# Patient Record
Sex: Female | Born: 1945 | Race: White | Hispanic: No | Smoking: Never smoker
Health system: Southern US, Community
[De-identification: ages and names within clinical notes are randomized; demographics above are authoritative.]

## PROBLEM LIST (undated history)

## (undated) DIAGNOSIS — Z8719 Personal history of other diseases of the digestive system: Secondary | ICD-10-CM

## (undated) DIAGNOSIS — D649 Anemia, unspecified: Secondary | ICD-10-CM

## (undated) DIAGNOSIS — E7800 Pure hypercholesterolemia, unspecified: Secondary | ICD-10-CM

## (undated) DIAGNOSIS — E1142 Type 2 diabetes mellitus with diabetic polyneuropathy: Secondary | ICD-10-CM

## (undated) DIAGNOSIS — F419 Anxiety disorder, unspecified: Secondary | ICD-10-CM

## (undated) DIAGNOSIS — I10 Essential (primary) hypertension: Secondary | ICD-10-CM

## (undated) DIAGNOSIS — K219 Gastro-esophageal reflux disease without esophagitis: Secondary | ICD-10-CM

## (undated) DIAGNOSIS — F32A Depression, unspecified: Secondary | ICD-10-CM

## (undated) DIAGNOSIS — E119 Type 2 diabetes mellitus without complications: Secondary | ICD-10-CM

## (undated) DIAGNOSIS — M1990 Unspecified osteoarthritis, unspecified site: Secondary | ICD-10-CM

## (undated) DIAGNOSIS — K5900 Constipation, unspecified: Secondary | ICD-10-CM

## (undated) DIAGNOSIS — G8929 Other chronic pain: Secondary | ICD-10-CM

## (undated) DIAGNOSIS — F329 Major depressive disorder, single episode, unspecified: Secondary | ICD-10-CM

## (undated) HISTORY — DX: Gastro-esophageal reflux disease without esophagitis: K21.9

## (undated) HISTORY — DX: Anxiety disorder, unspecified: F41.9

## (undated) HISTORY — PX: TOE SURGERY: SHX1073

## (undated) HISTORY — PX: VAGINAL HYSTERECTOMY: SUR661

## (undated) HISTORY — PX: COLONOSCOPY: SHX174

## (undated) HISTORY — PX: POLYPECTOMY: SHX149

## (undated) HISTORY — DX: Essential (primary) hypertension: I10

## (undated) HISTORY — PX: BACK SURGERY: SHX140

---
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
## 1990-04-08 HISTORY — PX: LUMBAR DISC SURGERY: SHX700

## 1994-04-08 HISTORY — PX: POSTERIOR LUMBAR FUSION: SHX6036

## 1997-12-20 ENCOUNTER — Other Ambulatory Visit: Admission: RE | Admit: 1997-12-20 | Discharge: 1997-12-20 | Payer: Self-pay | Admitting: Obstetrics and Gynecology

## 2002-01-11 ENCOUNTER — Ambulatory Visit (HOSPITAL_COMMUNITY): Admission: RE | Admit: 2002-01-11 | Discharge: 2002-01-11 | Payer: Self-pay | Admitting: Orthopedic Surgery

## 2002-01-11 ENCOUNTER — Encounter: Payer: Self-pay | Admitting: Orthopedic Surgery

## 2002-10-09 ENCOUNTER — Encounter: Payer: Self-pay | Admitting: Neurology

## 2002-10-09 ENCOUNTER — Ambulatory Visit (HOSPITAL_COMMUNITY): Admission: RE | Admit: 2002-10-09 | Discharge: 2002-10-09 | Payer: Self-pay | Admitting: Neurology

## 2003-12-22 ENCOUNTER — Other Ambulatory Visit: Admission: RE | Admit: 2003-12-22 | Discharge: 2003-12-22 | Payer: Self-pay | Admitting: Family Medicine

## 2004-02-23 ENCOUNTER — Ambulatory Visit: Payer: Self-pay | Admitting: Family Medicine

## 2004-11-08 ENCOUNTER — Ambulatory Visit: Payer: Self-pay | Admitting: Family Medicine

## 2004-11-17 ENCOUNTER — Encounter: Admission: RE | Admit: 2004-11-17 | Discharge: 2004-11-17 | Payer: Self-pay | Admitting: Family Medicine

## 2004-12-28 ENCOUNTER — Ambulatory Visit: Payer: Self-pay | Admitting: Family Medicine

## 2005-01-17 ENCOUNTER — Ambulatory Visit: Payer: Self-pay | Admitting: Family Medicine

## 2005-02-01 ENCOUNTER — Ambulatory Visit: Payer: Self-pay | Admitting: Family Medicine

## 2006-08-29 ENCOUNTER — Encounter: Payer: Self-pay | Admitting: Family Medicine

## 2006-09-03 DIAGNOSIS — I10 Essential (primary) hypertension: Secondary | ICD-10-CM | POA: Insufficient documentation

## 2006-09-03 DIAGNOSIS — G589 Mononeuropathy, unspecified: Secondary | ICD-10-CM | POA: Insufficient documentation

## 2006-09-03 DIAGNOSIS — G4700 Insomnia, unspecified: Secondary | ICD-10-CM | POA: Insufficient documentation

## 2006-09-03 DIAGNOSIS — N951 Menopausal and female climacteric states: Secondary | ICD-10-CM | POA: Insufficient documentation

## 2006-09-12 ENCOUNTER — Encounter: Payer: Self-pay | Admitting: Family Medicine

## 2006-09-15 ENCOUNTER — Encounter (INDEPENDENT_AMBULATORY_CARE_PROVIDER_SITE_OTHER): Payer: Self-pay | Admitting: *Deleted

## 2007-02-12 ENCOUNTER — Ambulatory Visit: Payer: Self-pay | Admitting: Family Medicine

## 2007-02-12 DIAGNOSIS — M545 Low back pain, unspecified: Secondary | ICD-10-CM | POA: Insufficient documentation

## 2007-02-12 DIAGNOSIS — K219 Gastro-esophageal reflux disease without esophagitis: Secondary | ICD-10-CM | POA: Insufficient documentation

## 2007-04-14 ENCOUNTER — Telehealth (INDEPENDENT_AMBULATORY_CARE_PROVIDER_SITE_OTHER): Payer: Self-pay | Admitting: *Deleted

## 2007-05-12 ENCOUNTER — Encounter: Payer: Self-pay | Admitting: Family Medicine

## 2007-05-22 ENCOUNTER — Telehealth (INDEPENDENT_AMBULATORY_CARE_PROVIDER_SITE_OTHER): Payer: Self-pay | Admitting: *Deleted

## 2007-05-25 ENCOUNTER — Telehealth (INDEPENDENT_AMBULATORY_CARE_PROVIDER_SITE_OTHER): Payer: Self-pay | Admitting: *Deleted

## 2007-07-08 ENCOUNTER — Telehealth (INDEPENDENT_AMBULATORY_CARE_PROVIDER_SITE_OTHER): Payer: Self-pay | Admitting: *Deleted

## 2007-07-09 ENCOUNTER — Ambulatory Visit: Payer: Self-pay | Admitting: Internal Medicine

## 2007-07-09 DIAGNOSIS — J069 Acute upper respiratory infection, unspecified: Secondary | ICD-10-CM | POA: Insufficient documentation

## 2007-11-12 ENCOUNTER — Telehealth (INDEPENDENT_AMBULATORY_CARE_PROVIDER_SITE_OTHER): Payer: Self-pay | Admitting: *Deleted

## 2008-08-11 ENCOUNTER — Other Ambulatory Visit: Admission: RE | Admit: 2008-08-11 | Discharge: 2008-08-11 | Payer: Self-pay | Admitting: Family Medicine

## 2008-08-11 ENCOUNTER — Encounter: Payer: Self-pay | Admitting: Family Medicine

## 2008-08-11 ENCOUNTER — Ambulatory Visit: Payer: Self-pay | Admitting: Family Medicine

## 2008-08-11 DIAGNOSIS — M810 Age-related osteoporosis without current pathological fracture: Secondary | ICD-10-CM | POA: Insufficient documentation

## 2008-08-12 ENCOUNTER — Encounter: Payer: Self-pay | Admitting: Family Medicine

## 2008-08-15 ENCOUNTER — Encounter (INDEPENDENT_AMBULATORY_CARE_PROVIDER_SITE_OTHER): Payer: Self-pay | Admitting: *Deleted

## 2008-09-07 ENCOUNTER — Ambulatory Visit: Payer: Self-pay | Admitting: Family Medicine

## 2008-09-18 LAB — CONVERTED CEMR LAB
ALT: 23 units/L (ref 0–35)
AST: 23 units/L (ref 0–37)
Albumin: 3.7 g/dL (ref 3.5–5.2)
Alkaline Phosphatase: 48 units/L (ref 39–117)
BUN: 10 mg/dL (ref 6–23)
Basophils Absolute: 0 10*3/uL (ref 0.0–0.1)
Basophils Relative: 0.6 % (ref 0.0–3.0)
Bilirubin, Direct: 0 mg/dL (ref 0.0–0.3)
CO2: 33 meq/L — ABNORMAL HIGH (ref 19–32)
Calcium: 9 mg/dL (ref 8.4–10.5)
Chloride: 103 meq/L (ref 96–112)
Cholesterol: 193 mg/dL (ref 0–200)
Creatinine, Ser: 0.6 mg/dL (ref 0.4–1.2)
Eosinophils Absolute: 0.1 10*3/uL (ref 0.0–0.7)
Eosinophils Relative: 1.8 % (ref 0.0–5.0)
GFR calc non Af Amer: 108.22 mL/min (ref >60)
Glucose, Bld: 132 mg/dL — ABNORMAL HIGH (ref 70–99)
HCT: 39.5 % (ref 36.0–46.0)
HDL: 71.8 mg/dL (ref >39.00)
Hemoglobin: 13.6 g/dL (ref 12.0–15.0)
LDL Cholesterol: 105 mg/dL — ABNORMAL HIGH (ref 0–99)
Lymphocytes Relative: 23.1 % (ref 12.0–46.0)
Lymphs Abs: 1.8 10*3/uL (ref 0.7–4.0)
MCHC: 34.4 g/dL (ref 30.0–36.0)
MCV: 83.4 fL (ref 78.0–100.0)
Monocytes Absolute: 0.6 10*3/uL (ref 0.1–1.0)
Monocytes Relative: 8.2 % (ref 3.0–12.0)
Neutro Abs: 5.3 10*3/uL (ref 1.4–7.7)
Neutrophils Relative %: 66.3 % (ref 43.0–77.0)
Platelets: 218 10*3/uL (ref 150.0–400.0)
Potassium: 3.6 meq/L (ref 3.5–5.1)
RBC: 4.73 M/uL (ref 3.87–5.11)
RDW: 12 % (ref 11.5–14.6)
Sodium: 142 meq/L (ref 135–145)
TSH: 0.75 microintl units/mL (ref 0.35–5.50)
Total Bilirubin: 0.5 mg/dL (ref 0.3–1.2)
Total CHOL/HDL Ratio: 3
Total Protein: 7 g/dL (ref 6.0–8.3)
Triglycerides: 83 mg/dL (ref 0.0–149.0)
VLDL: 16.6 mg/dL (ref 0.0–40.0)
WBC: 7.8 10*3/uL (ref 4.5–10.5)

## 2008-09-19 ENCOUNTER — Encounter (INDEPENDENT_AMBULATORY_CARE_PROVIDER_SITE_OTHER): Payer: Self-pay | Admitting: *Deleted

## 2008-12-23 ENCOUNTER — Telehealth (INDEPENDENT_AMBULATORY_CARE_PROVIDER_SITE_OTHER): Payer: Self-pay | Admitting: *Deleted

## 2009-01-27 ENCOUNTER — Ambulatory Visit: Payer: Self-pay | Admitting: Family Medicine

## 2009-02-07 ENCOUNTER — Encounter (INDEPENDENT_AMBULATORY_CARE_PROVIDER_SITE_OTHER): Payer: Self-pay | Admitting: *Deleted

## 2009-02-07 ENCOUNTER — Telehealth (INDEPENDENT_AMBULATORY_CARE_PROVIDER_SITE_OTHER): Payer: Self-pay | Admitting: *Deleted

## 2009-02-07 LAB — CONVERTED CEMR LAB
ALT: 21 units/L (ref 0–35)
AST: 22 units/L (ref 0–37)
Albumin: 3.6 g/dL (ref 3.5–5.2)
Alkaline Phosphatase: 47 units/L (ref 39–117)
BUN: 11 mg/dL (ref 6–23)
Bilirubin, Direct: 0 mg/dL (ref 0.0–0.3)
CO2: 32 meq/L (ref 19–32)
Calcium: 8.8 mg/dL (ref 8.4–10.5)
Chloride: 101 meq/L (ref 96–112)
Cholesterol: 144 mg/dL (ref 0–200)
Creatinine, Ser: 0.7 mg/dL (ref 0.4–1.2)
GFR calc non Af Amer: 90.46 mL/min (ref >60)
Glucose, Bld: 125 mg/dL — ABNORMAL HIGH (ref 70–99)
HDL: 61.4 mg/dL (ref >39.00)
Hgb A1c MFr Bld: 6.9 % — ABNORMAL HIGH (ref 4.6–6.5)
LDL Cholesterol: 68 mg/dL (ref 0–99)
Potassium: 3.6 meq/L (ref 3.5–5.1)
Sodium: 141 meq/L (ref 135–145)
Total Bilirubin: 0.5 mg/dL (ref 0.3–1.2)
Total CHOL/HDL Ratio: 2
Total Protein: 6.7 g/dL (ref 6.0–8.3)
Triglycerides: 72 mg/dL (ref 0.0–149.0)
VLDL: 14.4 mg/dL (ref 0.0–40.0)

## 2009-02-09 ENCOUNTER — Ambulatory Visit: Payer: Self-pay | Admitting: Family Medicine

## 2009-02-13 ENCOUNTER — Encounter (INDEPENDENT_AMBULATORY_CARE_PROVIDER_SITE_OTHER): Payer: Self-pay | Admitting: *Deleted

## 2009-02-13 LAB — CONVERTED CEMR LAB
Free T4: 0.9 ng/dL (ref 0.6–1.6)
T3, Free: 3.5 pg/mL (ref 2.3–4.2)
TSH: 1.53 microintl units/mL (ref 0.35–5.50)

## 2009-04-24 ENCOUNTER — Telehealth (INDEPENDENT_AMBULATORY_CARE_PROVIDER_SITE_OTHER): Payer: Self-pay | Admitting: *Deleted

## 2009-07-20 ENCOUNTER — Ambulatory Visit: Payer: Self-pay | Admitting: Family Medicine

## 2009-07-21 ENCOUNTER — Encounter: Payer: Self-pay | Admitting: Family Medicine

## 2009-07-25 LAB — CONVERTED CEMR LAB
ALT: 23 units/L (ref 0–35)
AST: 25 units/L (ref 0–37)
Albumin: 3.6 g/dL (ref 3.5–5.2)
Alkaline Phosphatase: 50 units/L (ref 39–117)
BUN: 6 mg/dL (ref 6–23)
Bilirubin, Direct: 0 mg/dL (ref 0.0–0.3)
CO2: 31 meq/L (ref 19–32)
Calcium: 9 mg/dL (ref 8.4–10.5)
Chloride: 102 meq/L (ref 96–112)
Cholesterol: 133 mg/dL (ref 0–200)
Creatinine, Ser: 0.7 mg/dL (ref 0.4–1.2)
GFR calc non Af Amer: 90.32 mL/min (ref >60)
Glucose, Bld: 119 mg/dL — ABNORMAL HIGH (ref 70–99)
HDL: 72.4 mg/dL (ref >39.00)
Hgb A1c MFr Bld: 7 % — ABNORMAL HIGH (ref 4.6–6.5)
LDL Cholesterol: 48 mg/dL (ref 0–99)
Potassium: 3.4 meq/L — ABNORMAL LOW (ref 3.5–5.1)
Sodium: 142 meq/L (ref 135–145)
Total Bilirubin: 0.3 mg/dL (ref 0.3–1.2)
Total CHOL/HDL Ratio: 2
Total Protein: 7 g/dL (ref 6.0–8.3)
Triglycerides: 63 mg/dL (ref 0.0–149.0)
VLDL: 12.6 mg/dL (ref 0.0–40.0)

## 2009-08-10 ENCOUNTER — Ambulatory Visit: Payer: Self-pay | Admitting: Family Medicine

## 2009-08-10 DIAGNOSIS — E1142 Type 2 diabetes mellitus with diabetic polyneuropathy: Secondary | ICD-10-CM | POA: Insufficient documentation

## 2009-08-16 ENCOUNTER — Telehealth (INDEPENDENT_AMBULATORY_CARE_PROVIDER_SITE_OTHER): Payer: Self-pay | Admitting: *Deleted

## 2009-08-25 ENCOUNTER — Telehealth: Payer: Self-pay | Admitting: Family Medicine

## 2009-11-21 ENCOUNTER — Encounter: Payer: Self-pay | Admitting: Family Medicine

## 2009-11-24 ENCOUNTER — Ambulatory Visit: Payer: Self-pay | Admitting: Family Medicine

## 2009-11-27 LAB — CONVERTED CEMR LAB
ALT: 16 units/L (ref 0–35)
AST: 15 units/L (ref 0–37)
Albumin: 3.7 g/dL (ref 3.5–5.2)
Alkaline Phosphatase: 50 units/L (ref 39–117)
BUN: 14 mg/dL (ref 6–23)
Bilirubin, Direct: 0.1 mg/dL (ref 0.0–0.3)
CO2: 35 meq/L — ABNORMAL HIGH (ref 19–32)
Calcium: 9.4 mg/dL (ref 8.4–10.5)
Chloride: 99 meq/L (ref 96–112)
Cholesterol: 152 mg/dL (ref 0–200)
Creatinine, Ser: 0.7 mg/dL (ref 0.4–1.2)
Creatinine,U: 147.8 mg/dL
GFR calc non Af Amer: 85.95 mL/min (ref >60)
Glucose, Bld: 98 mg/dL (ref 70–99)
HDL: 66 mg/dL (ref >39.00)
Hgb A1c MFr Bld: 6.9 % — ABNORMAL HIGH (ref 4.6–6.5)
LDL Cholesterol: 66 mg/dL (ref 0–99)
Microalb Creat Ratio: 1.9 mg/g (ref 0.0–30.0)
Microalb, Ur: 2.8 mg/dL — ABNORMAL HIGH (ref 0.0–1.9)
Potassium: 3.8 meq/L (ref 3.5–5.1)
Sodium: 141 meq/L (ref 135–145)
Total Bilirubin: 0.4 mg/dL (ref 0.3–1.2)
Total CHOL/HDL Ratio: 2
Total Protein: 6.6 g/dL (ref 6.0–8.3)
Triglycerides: 100 mg/dL (ref 0.0–149.0)
VLDL: 20 mg/dL (ref 0.0–40.0)

## 2010-01-04 ENCOUNTER — Encounter: Payer: Self-pay | Admitting: Family Medicine

## 2010-01-25 ENCOUNTER — Encounter: Payer: Self-pay | Admitting: Family Medicine

## 2010-02-14 ENCOUNTER — Telehealth: Payer: Self-pay | Admitting: Family Medicine

## 2010-02-15 ENCOUNTER — Encounter: Payer: Self-pay | Admitting: Gastroenterology

## 2010-02-15 ENCOUNTER — Ambulatory Visit: Payer: Self-pay | Admitting: Family Medicine

## 2010-02-15 DIAGNOSIS — M25571 Pain in right ankle and joints of right foot: Secondary | ICD-10-CM | POA: Insufficient documentation

## 2010-02-28 ENCOUNTER — Ambulatory Visit: Payer: Self-pay | Admitting: Family Medicine

## 2010-02-28 DIAGNOSIS — J018 Other acute sinusitis: Secondary | ICD-10-CM | POA: Insufficient documentation

## 2010-02-28 DIAGNOSIS — R110 Nausea: Secondary | ICD-10-CM | POA: Insufficient documentation

## 2010-03-26 ENCOUNTER — Telehealth: Payer: Self-pay | Admitting: Family Medicine

## 2010-03-28 ENCOUNTER — Ambulatory Visit: Payer: Self-pay | Admitting: Gastroenterology

## 2010-04-16 ENCOUNTER — Telehealth (INDEPENDENT_AMBULATORY_CARE_PROVIDER_SITE_OTHER): Payer: Self-pay | Admitting: *Deleted

## 2010-04-19 ENCOUNTER — Encounter: Payer: Self-pay | Admitting: Family Medicine

## 2010-04-19 ENCOUNTER — Ambulatory Visit
Admission: RE | Admit: 2010-04-19 | Discharge: 2010-04-19 | Payer: Self-pay | Source: Home / Self Care | Attending: Family Medicine | Admitting: Family Medicine

## 2010-04-19 DIAGNOSIS — R42 Dizziness and giddiness: Secondary | ICD-10-CM | POA: Insufficient documentation

## 2010-04-25 ENCOUNTER — Telehealth: Payer: Self-pay | Admitting: Family Medicine

## 2010-04-26 ENCOUNTER — Telehealth: Payer: Self-pay | Admitting: Family Medicine

## 2010-05-02 ENCOUNTER — Telehealth (INDEPENDENT_AMBULATORY_CARE_PROVIDER_SITE_OTHER): Payer: Self-pay | Admitting: *Deleted

## 2010-05-08 ENCOUNTER — Encounter (INDEPENDENT_AMBULATORY_CARE_PROVIDER_SITE_OTHER): Payer: Self-pay | Admitting: *Deleted

## 2010-05-08 ENCOUNTER — Ambulatory Visit
Admission: RE | Admit: 2010-05-08 | Discharge: 2010-05-08 | Payer: Self-pay | Source: Home / Self Care | Attending: Gastroenterology | Admitting: Gastroenterology

## 2010-05-08 DIAGNOSIS — R1013 Epigastric pain: Secondary | ICD-10-CM

## 2010-05-08 DIAGNOSIS — K3189 Other diseases of stomach and duodenum: Secondary | ICD-10-CM | POA: Insufficient documentation

## 2010-05-08 DIAGNOSIS — R197 Diarrhea, unspecified: Secondary | ICD-10-CM | POA: Insufficient documentation

## 2010-05-10 NOTE — Progress Notes (Signed)
Summary: Phone-rx  Phone Note Call from Patient Call back at Home Phone [phone]   Caller: Patient Summary of Call: patient is requesting a new rx on estradiol.  pharmacy [facility] [address]  Initial call taken by: Barb Merino,  April 24, 2009 4:06 PM    Prescriptions: ESTRADIOL 1 MG  TABS (ESTRADIOL) 1 by mouth once daily  #30 x 0   Entered by:   Army Fossa CMA   Authorized by:   Loreen Freud DO   Signed by:   Army Fossa CMA on 04/24/2009   Method used:   Electronically to        [facility] (retail)       [address]       [place], [place]  [address]       Ph: [phone]       Fax: [phone]   RxID:   [id number]

## 2010-05-10 NOTE — Miscellaneous (Signed)
  Clinical Lists Changes  Observations: Added new observation of ZOSTAVAX: Zostavax (01/02/2010 15:23)      Immunization History:  Zostavax History:    Zostavax # 1:  Zostavax (01/02/2010)

## 2010-05-10 NOTE — Progress Notes (Signed)
Summary: Refill Request  Phone Note Refill Request Call back at [phone] Message from:  Pharmacy on April 16, 2010 10:51 AM  Refills Requested: Medication #1:  GLUCOPHAGE XR 500 MG XR24H-TAB 1 by mouth at bedtime   Dosage confirmed as above?Dosage Confirmed   Supply Requested: 1 month   Last Refilled: 02/15/2010 [facility] [address]   Next Appointment Scheduled: none Initial call taken by: Harold Barban,  April 16, 2010 10:52 AM    Prescriptions: GLUCOPHAGE XR 500 MG XR24H-TAB (METFORMIN HCL) 1 by mouth at bedtime  #30 Each x 1   Entered by:   Almeta Monas CMA (AAMA)   Authorized by:   Loreen Freud DO   Signed by:   Almeta Monas CMA (AAMA) on 04/16/2010   Method used:   Faxed to ...       [facility] (ret[facility])       [address]       [place], [place]  [address]       Ph: [phone]       Fax: [phone]   RxID:   [id number]

## 2010-05-10 NOTE — Progress Notes (Signed)
Summary: Rx request  Phone Note Refill Request Call back at Home Phone [phone] Message from:  Patient  Refills Requested: Medication #1:  DEXILANT 60 MG 1 tab qd Pt states that she was given sample of med and would now like to have a Rx sent in to pharmacy.Pt uses [facility] [address] Pls advise..........Marland KitchenFelecia Deloach CMA  April 25, 2010 10:56 AM    Follow-up for Phone Call        ok to send #30  5 refills  1 by mouth once daily  Follow-up by: Loreen Freud DO,  April 25, 2010 11:28 AM    New/Updated Medications: DEXILANT 60 MG CPDR (DEXLANSOPRAZOLE) 1 by mouth once daily Prescriptions: DEXILANT 60 MG CPDR (DEXLANSOPRAZOLE) 1 by mouth once daily  #30 x 5   Entered by:   Almeta Monas CMA (AAMA)   Authorized by:   Loreen Freud DO   Signed by:   Almeta Monas CMA (AAMA) on 04/25/2010   Method used:   Faxed to ...       [facility] [id number] (retail)       [address] [place], [place]  [address]       Ph: [phone]       Fax: [phone]   RxID:   [id number]

## 2010-05-10 NOTE — Progress Notes (Signed)
Summary: rx-[facility]  Phone Note Refill Request Call back at Home Phone [phone] Message from:  Patient on [facility]  Refills Requested: Medication #1:  ZOSTAVAX 62130 UNT/0.65ML SOLR 1 ml IM x1 Pt states that she just received a call that they have her vaccine in. pt would like to have rx sent to pharmacy.  Initial call taken by: Jeremy Johann CMA,  Aug 16, 2009 4:10 PM  Follow-up for Phone Call        pt aware rx sent to pharmacy.[facility] Del[facility] CMA  Aug 16, 2009 4:16 PM     Prescriptions: ZOSTAVAX [id number] UNT/0.65ML SOLR (ZOSTER VACCINE LIVE) 1 ml IM x1  #1 x 0   Entered by:   Jeremy Johann CMA   Authorized by:   Loreen Freud DO   Signed by:   Jeremy Johann CMA on 08/16/2009   Method used:   Faxed to ...       [facility]* (retail)       [address]       [place], [place]  [id number]       Ph: [phone]       Fax: [phone]   RxID:   [id number]

## 2010-05-10 NOTE — Progress Notes (Signed)
Summary: sinus infection  Phone Note Call from Patient Call back at Home Phone [phone]   Caller: Patient Summary of Call: Pt seen on 02-28-10 Pt states that she is no better and would like to get a med Rx. Pt not c/o head congestion, drainage, and facial pain. Pt denies a cough, fever, or sore throat. Pt advise OV may be needed since it has been just about a month since last OV. Pt states that she is unable to afford OV and med so she would prefer if med can be Rx. [facility] [address] Pls advise.................[facility]land KitchenFelecia Deloach CMA  March 26, 2010 10:00 AM   Follow-up for Phone Call        I will do this time since it's a busy week and the holidays but let her know she should not wait more than 2 weeks to call us back.  Rx sent to pharmacy Follow-up by: Loreen Freud DO,  March 26, 2010 10:42 AM  Additional Follow-up for Phone Call Additional follow up Details #1::        pt aware.... Almeta Monas CMA Duncan Dull)  March 26, 2010 11:21 AM     New/Updated Medications: AUGMENTIN 875-125 MG TABS (AMOXICILLIN-POT CLAVULANATE) 1 by mouth two times a day Prescriptions: AUGMENTIN 875-125 MG TABS (AMOXICILLIN-POT CLAVULANATE) 1 by mouth two times a day  #20 x 0   Entered and Authorized by:   Loreen Freud DO   Signed by:   Loreen Freud DO on 03/26/2010   Method used:   Electronically to        [id number] (retail)       [address] [place], [place]  [address]       Ph: [phone]       Fax: [phone]   RxID:   [id number] AUGMENTIN 875-125 MG TABS (AMOXICILLIN-POT CLAVULANATE) 1 by mouth two times a day  #20 x 0   Entered and Authorized by:   Loreen Freud DO   Signed by:   Loreen Freud DO on 03/26/2010   Method used:   Electronically to        [facility] (retail)       [address]       [place], [place]  [address]       Ph: [phone]       Fax: [phone]   RxID:   [id number]

## 2010-05-10 NOTE — Medication Information (Signed)
Summary: [facility]/[employer]  [facility]   Imported By: Lanelle Bal 11/30/2009 14:18:17  _____________________________________________________________________  External Attachment:    Type:   Image     Comment:   External Document

## 2010-05-10 NOTE — Progress Notes (Signed)
Summary: med too expensive  Phone Note Refill Request Call back at Home Phone [phone]   Refills Requested: Medication #1:  DEXILANT 60 MG CPDR 1 by mouth once daily. Pt states that med is $75 and she is unable to afford it. Pt would like to try a cheaper alternative. [facility] uses [facility] Pls advise.........Marland KitchenFelecia Deloach CMA  April 26, 2010 11:44 AM    Follow-up for Phone Call        omeprazole did not work --- pt was supposed to see GI --- appointment was supposed to be last month --- what happened? Follow-up by: Loreen Freud DO,  April 26, 2010 4:50 PM  Additional Follow-up for Phone Call Additional follow up Details #1::        Pt not scheduled to see GI until 05-08-10....[facility] Kitchen[facility]lecia Deloach CMA  April 26, 2010 5:01 PM     Additional Follow-up for Phone Call Additional follow up Details #2::    nexium 40 mg  #30  1 by mouth once daily 2 refills Follow-up by: Loreen Freud DO,  April 26, 2010 5:03 PM  Additional Follow-up for Phone Call Additional follow up Details #3:: Details for Additional Follow-up Action Taken: pt aware RX sent to pharmacy Additional Follow-up by: Almeta Monas CMA Duncan Dull),  April 26, 2010 5:05 PM  New/Updated Medications: NEXIUM 40 MG CPDR (ESOMEPRAZOLE MAGNESIUM) 1 by mouth once daily Prescriptions: NEXIUM 40 MG CPDR (ESOMEPRAZOLE MAGNESIUM) 1 by mouth once daily  #30 x 2   Entered by:   Almeta Monas CMA (AAMA)   Authorized by:   Loreen Freud DO   Signed by:   Almeta Monas CMA (AAMA) on 04/26/2010   Method used:   Electronically to        [facility] [id number] (retail)       [address] [place], [place]  [address]       Ph: [phone]       Fax: [phone]   RxID:   [id number]

## 2010-05-10 NOTE — Letter (Signed)
Summary: New Patient letter  [facility]  [address] [place], [place] [address]   Phone: [phone]  Fax: [phone]       02/15/2010 MRN: [id number]  Brittany [name] [address] [place], [place]  [address]  Dear Ms. Hogston,  Welcome to the Gastroenterology Division at [facility].    You are scheduled to see Dr.  Christella Hartigan on 03-28-10 at 2:30pm on the 3rd floor at [facility], [address]  We ask that you try to arrive at our office 15 minutes prior to your appointment time to allow for check-in.  We would like you to complete the enclosed self-administered evaluation form prior to your visit and bring it with you on the day of your appointment.  We will review it with you.  Also, please bring a complete list of all your medications or, if you prefer, bring the medication bottles and we will list them.  Please bring your insurance card so that we may make a copy of it.  If your insurance requires a referral to see a specialist, please bring your referral form from your primary care physician.  Co-payments are due at the time of your visit and may be paid by cash, check or credit card.     Your office visit will consist of a consult with your physician (includes a physical exam), any laboratory testing he/she may order, scheduling of any necessary diagnostic testing (e.g. x-ray, ultrasound, CT-scan), and scheduling of a procedure (e.g. Endoscopy, Colonoscopy) if required.  Please allow enough time on your schedule to allow for any/all of these possibilities.    If you cannot keep your appointment, please call [phone] to cancel or reschedule prior to your appointment date.  This allows [place] the opportunity to schedule an appointment for another patient in need of care.  If you do not cancel or reschedule by 5 p.m. the business day prior to your appointment date, you will be charged a $50.00 late cancellation/no-show fee.    Thank you for choosing [facility] for your medical needs.  We appreciate the opportunity to care for you.  Please visit [place] at our website  to learn more about our practice.                     Sincerely,                                                             The Gastroenterology Division

## 2010-05-10 NOTE — Assessment & Plan Note (Signed)
Summary: dizziness//fd   Vital Signs:  Patient profile:   65 year old female Menstrual status:  hysterectomy Weight:      188.2 pounds Pulse rate:   80 / minute Pulse rhythm:   regular BP sitting:   122 / 76  (left arm) Cuff size:   regular  Vitals Entered By: Almeta Monas CMA Duncan Dull) (April 19, 2010 3:52 PM) CC: xweeks c/o dizziness when getting-denies sinus issues, also feels hungry after eating and concerned   History of Present Illness: Pt here c/o dizziness still ---she denies any sinus congestion,  chest pain or palpatations.   She is still having GERD symptoms ---her GI appointment is at the end of the month.  Her BS are "good" per pt.  She denies and abnormally low lows for her or highs.    Current Medications (verified): 1)  Amlodipine Besylate 5 Mg  Tabs (Amlodipine Besylate) .[name].. 1 By Mouth Once Daily 2)  Bisoprolol-Hydrochlorothiazide 10-6.25 Mg  Tabs (Bisoprolol-Hydrochlorothiazide) .[name].. 1 By Mouth Once Daily 3)  Neurontin 800 Mg  Tabs (Gabapentin) .[name].. 1 By Mouth Four Times A Day 4)  Doxepin Hcl 100 Mg  Caps (Doxepin Hcl) .[name].. 1 By Mouth At Bedtime 5)  Protonix 40 Mg Tbec (Pantoprazole Sodium) .[name].. 1 By Mouth Once Daily 6)  Estradiol 1 Mg  Tabs (Estradiol) .[name].. 1 By Mouth Once Daily 7)  Tizanidine Hcl 4 Mg  Tabs (Tizanidine Hcl) .[name].. 1 By Mouth Every 8hours 8)  Omeprazole 40 Mg Cpdr (Omeprazole) .[name].. 1 By Mouth Once Daily 9)  Tums 750mg  .... 4 By Mouth Once Daily As Needed 10)  Stool Softner .[name].. 1 By Mouth Once Daily 11)  Aspirin Ec 325 Mg  Tbec (Aspirin) .[name].. 1 By Mouth Once Daily 12)  Mvi .[name].. 1 By Mouth Once Daily 13)  Vit-D3 1000iu .[name].. 1 By Mouth Once Daily 14)  Morphine Sulfate Cr 60 Mg  Tb12 (Morphine Sulfate) .[name].. 1 By Mouth Two Times A Day 15)  Zocor 40 Mg Tabs (Simvastatin) .... Take One Tablet Each Evening At Bedtime. 16)  Fish Oil 17)  Glucophage Xr 500 Mg Xr24h-Tab (Metformin Hcl) .[name].. 1 By Mouth At Bedtime 18)  Onetouch Delica Lancets  Misc  (Lancets) .... Accu Check Two Times A Day 19)  One Touch Ultra Mini Strips .... Accu Check Two Times A Day 20)  Flonase 50 Mcg/act Susp (Fluticasone Propionate) .... 2 Sprays Each Nostril Once Daily 21)  Antivert 25 Mg Tabs (Meclizine Hcl) .[name].. 1 By Mouth Qid As Needed  Allergies (verified): 1)  ! Sulfa  Past History:  Past Medical History: Last updated: 08/10/2009 Hypertension GERD Low back pain----pain management Current Problems:  LOW BACK PAIN (ICD-724.2) PREVENTIVE HEALTH CARE (ICD-V70.0) GERD (ICD-530.81) NEUROPATHY (ICD-355.9) HOT FLASHES (ICD-627.2) INSOMNIA (ICD-780.52) HYPERTENSION (ICD-401.9) Diabetes mellitus, type II  Past Surgical History: Last updated: 02/12/2007 Lumbar fusion  Family History: Last updated: 08/10/2009 Family History CAD Family History Diabetes 1st degree relative  Social History: Last updated: 08/11/2008 Married Never Smoked Alcohol use-no Drug use-no  Risk Factors: Alcohol Use: 0 (08/11/2008) Caffeine Use: 2 (08/11/2008) Diet: well balanced (08/11/2008) Exercise: no (08/11/2008)  Risk Factors: Smoking Status: never (08/11/2008)  Family History: Reviewed history from 08/10/2009 and no changes required. Family History CAD Family History Diabetes 1st degree relative  Social History: Reviewed history from 08/11/2008 and no changes required. Married Never Smoked Alcohol use-no Drug use-no  Review of Systems      See HPI  Physical Exam  General:  Well-developed,well-nourished,in  no acute distress; alert,appropriate and cooperative throughout examination Ears:  External ear exam shows no significant lesions or deformities.  Otoscopic examination reveals clear canals, tympanic membranes are intact bilaterally without bulging, retraction, inflammation or discharge. Hearing is grossly normal bilaterally. Neck:  No deformities, masses, or tenderness noted. Lungs:  Normal respiratory effort, chest expands symmetrically. Lungs  are clear to auscultation, no crackles or wheezes. Heart:  Normal rate and regular rhythm. S1 and S2 normal without gallop, murmur, click, rub or other extra sounds. Abdomen:  Bowel sounds positive,abdomen soft and non-tender without masses, organomegaly or hernias noted. Cervical Nodes:  No lymphadenopathy noted Psych:  Oriented X3 and normally interactive.     Impression & Recommendations:  Problem # 1:  GERD (ICD-530.81)  Her updated medication list for this problem includes:    Protonix 40 Mg Tbec (Pantoprazole sodium) .[name]... 1 by mouth once daily    Omeprazole 40 Mg Cpdr (Omeprazole) .[name]... 1 by mouth once daily  Orders: EKG w/ Interpretation ([address])  Problem # 2:  DIZZINESS (ICD-780.4)  Orders: Misc. Referral (Misc. Ref) EKG w/ Interpretation ([address])  Her updated medication list for this problem includes:    Antivert 25 Mg Tabs (Meclizine hcl) .[name]... 1 by mouth qid as needed  Complete Medication List: 1)  Amlodipine Besylate 5 Mg Tabs (Amlodipine besylate) .[name].. 1 by mouth once daily 2)  Bisoprolol-hydrochlorothiazide 10-6.25 Mg Tabs (Bisoprolol-hydrochlorothiazide) .[name].. 1 by mouth once daily 3)  Neurontin 800 Mg Tabs (Gabapentin) .[name].. 1 by mouth four times a day 4)  Doxepin Hcl 100 Mg Caps (Doxepin hcl) .[name].. 1 by mouth at bedtime 5)  Protonix 40 Mg Tbec (Pantoprazole sodium) .[name].. 1 by mouth once daily 6)  Estradiol 1 Mg Tabs (Estradiol) .[name].. 1 by mouth once daily 7)  Tizanidine Hcl 4 Mg Tabs (Tizanidine hcl) .[name].. 1 by mouth every 8hours 8)  Omeprazole 40 Mg Cpdr (Omeprazole) .[name].. 1 by mouth once daily 9)  Tums 750mg   .... 4 by mouth once daily as needed 10)  Stool Softner  .[name].. 1 by mouth once daily 11)  Aspirin Ec 325 Mg Tbec (Aspirin) .[name].. 1 by mouth once daily 12)  Mvi  .[name].. 1 by mouth once daily 13)  Vit-d3 1000iu  .[name].. 1 by mouth once daily 14)  Morphine Sulfate Cr 60 Mg Tb12 (Morphine sulfate) .[name].. 1 by mouth two times a day 15)  Zocor 40 Mg Tabs (Simvastatin) ....  Take one tablet each evening at bedtime. 16)  Fish Oil  17)  Glucophage Xr 500 Mg Xr24h-tab (Metformin hcl) .[name].. 1 by mouth at bedtime 18)  Onetouch Delica Lancets Misc (Lancets) .... Accu check two times a day 19)  One Touch Ultra Mini Strips  .... Accu check two times a day 20)  Flonase 50 Mcg/act Susp (Fluticasone propionate) .... 2 sprays each nostril once daily 21)  Antivert 25 Mg Tabs (Meclizine hcl) .[name].. 1 by mouth qid as needed Prescriptions: ANTIVERT 25 MG TABS (MECLIZINE HCL) 1 by mouth qid as needed  #30 x 0   Entered and Authorized by:   Loreen Freud DO   Signed by:   Loreen Freud DO on 04/19/2010   Method used:   Electronically to        [id number] (retail)       [address] [place], [place]  [address]       Ph: [phone]       Fax: [phone]  RxID:   [id number]    Orders Added: 1)  Misc. Referral [Misc. Ref] 2)  Est. Patient Level IV [[address]] 3)  EKG w/ Interpretation [[address]]

## 2010-05-10 NOTE — Assessment & Plan Note (Signed)
Summary: FELL IN JYNWGNF ON LEFT KNEE/KN   Vital Signs:  Patient profile:   65 year old female Menstrual status:  hysterectomy Height:      68 inches Weight:      190 pounds Temp:     98.1 degrees F oral Pulse rate:   82 / minute BP sitting:   160 / 80  (left arm)  Vitals Entered By: Jeremy Johann CMA (February 15, 2010 4:41 PM) CC: fell injury left knee   History of Present Illness: 65 yo woman here today after falling in [place].  'fell full blast on my knee'.  pt doesn't have feeling in the lower leg due to neuropathy.  knee is throbbing, swollen, but can't relay degree of pain due to neuropathy.  Current Medications (verified): 1)  Amlodipine Besylate 5 Mg  Tabs (Amlodipine Besylate) .[name].. 1 By Mouth Once Daily 2)  Bisoprolol-Hydrochlorothiazide 10-6.25 Mg  Tabs (Bisoprolol-Hydrochlorothiazide) .[name].. 1 By Mouth Once Daily 3)  Neurontin 800 Mg  Tabs (Gabapentin) .[name].. 1 By Mouth Four Times A Day 4)  Doxepin Hcl 100 Mg  Caps (Doxepin Hcl) .[name].. 1 By Mouth At Bedtime 5)  Protonix 20 Mg  Tbec (Pantoprazole Sodium) .[name].. 1 By Mouth Once Daily 6)  Estradiol 1 Mg  Tabs (Estradiol) .[name].. 1 By Mouth Once Daily 7)  Tizanidine Hcl 4 Mg  Tabs (Tizanidine Hcl) .[name].. 1 By Mouth Every 8hours 8)  Omeprazole 40 Mg Cpdr (Omeprazole) .[name].. 1 By Mouth Once Daily 9)  Tums 750mg  .... 4 By Mouth Once Daily As Needed 10)  Stool Softner .[name].. 1 By Mouth Once Daily 11)  Aspirin Ec 325 Mg  Tbec (Aspirin) .[name].. 1 By Mouth Once Daily 12)  Mvi .[name].. 1 By Mouth Once Daily 13)  Vit-D3 1000iu .[name].. 1 By Mouth Once Daily 14)  Morphine Sulfate Cr 60 Mg  Tb12 (Morphine Sulfate) .[name].. 1 By Mouth Two Times A Day 15)  Zocor 40 Mg Tabs (Simvastatin) .... Take One Tablet Each Evening At Bedtime. 16)  Fish Oil 17)  Glucophage Xr 500 Mg Xr24h-Tab (Metformin Hcl) .[name].. 1 By Mouth At Bedtime 18)  Onetouch Delica Lancets  Misc (Lancets) .... Accu Check Two Times A Day 19)  One Touch Ultra Mini Strips .... Accu Check Two Times A  Day  Allergies (verified): 1)  ! Sulfa  Review of Systems      See HPI  Physical Exam  General:  walking very gingerly Msk:  L knee w/ superficial abrasion over patella L patella markedly swollen- able to fully extend knee but limited flexion no obvious bony deformity or dislocation Pulses:  +2 DP/PT   Impression & Recommendations:  Problem # 1:  KNEE PAIN, LEFT (ICD-719.46) Assessment New abrasion cleaned w/ H2O2 in office and bandage applied.  given her inability to relay amount of pain and marked swelling of patella will send to orthopedic UC to ensure she has not fractured her patella.  pt and husband to head over there now.  directions and phone # given. Her updated medication list for this problem includes:    Tizanidine Hcl 4 Mg Tabs (Tizanidine hcl) .[name]... 1 by mouth every 8hours    Aspirin Ec 325 Mg Tbec (Aspirin) .[name]... 1 by mouth once daily    Morphine Sulfate Cr 60 Mg Tb12 (Morphine sulfate) .[name]... 1 by mouth two times a day  Complete Medication List: 1)  Amlodipine Besylate 5 Mg Tabs (Amlodipine besylate) .[name].. 1 by mouth once daily 2)  Bisoprolol-hydrochlorothiazide 10-6.25  Mg Tabs (Bisoprolol-hydrochlorothiazide) .[name].. 1 by mouth once daily 3)  Neurontin 800 Mg Tabs (Gabapentin) .[name].. 1 by mouth four times a day 4)  Doxepin Hcl 100 Mg Caps (Doxepin hcl) .[name].. 1 by mouth at bedtime 5)  Protonix 20 Mg Tbec (Pantoprazole sodium) .[name].. 1 by mouth once daily 6)  Estradiol 1 Mg Tabs (Estradiol) .[name].. 1 by mouth once daily 7)  Tizanidine Hcl 4 Mg Tabs (Tizanidine hcl) .[name].. 1 by mouth every 8hours 8)  Omeprazole 40 Mg Cpdr (Omeprazole) .[name].. 1 by mouth once daily 9)  Tums 750mg   .... 4 by mouth once daily as needed 10)  Stool Softner  .[name].. 1 by mouth once daily 11)  Aspirin Ec 325 Mg Tbec (Aspirin) .[name].. 1 by mouth once daily 12)  Mvi  .[name].. 1 by mouth once daily 13)  Vit-d3 1000iu  .[name].. 1 by mouth once daily 14)  Morphine Sulfate Cr 60 Mg Tb12 (Morphine sulfate) .[name].. 1 by mouth  two times a day 15)  Zocor 40 Mg Tabs (Simvastatin) .... Take one tablet each evening at bedtime. 16)  Fish Oil  17)  Glucophage Xr 500 Mg Xr24h-tab (Metformin hcl) .[name].. 1 by mouth at bedtime 18)  Onetouch Delica Lancets Misc (Lancets) .... Accu check two times a day 19)  One Touch Ultra Mini Strips  .... Accu check two times a day  Patient Instructions: 1)  Go to Orthopedic Urgent Office at [address] [facility] [employer].  R on [address] after crossing over [place]) 2)  ICE and Elevate the leg as much as possible 3)  Hang in there!   Orders Added: 1)  Est. Patient Level III [[address]]

## 2010-05-10 NOTE — Assessment & Plan Note (Signed)
Summary: roa//lch   Vital Signs:  Patient profile:   65 year old female Menstrual status:  hysterectomy Height:      68 inches Weight:      196 pounds BMI:     29.91 Pulse rate:   76 / minute Pulse rhythm:   regular BP sitting:   122 / 80  (left arm) Cuff size:   regular  Vitals Entered By: Army Fossa CMA (Aug 10, 2009 12:49 PM) CC: Pt here to follow up on labs.    History of Present Illness: Pt here to discuss DM and labs.   No complaints.    Current Medications (verified): 1)  Amlodipine Besylate 5 Mg  Tabs (Amlodipine Besylate) .[name].. 1 By Mouth Once Daily 2)  Bisoprolol-Hydrochlorothiazide 10-6.25 Mg  Tabs (Bisoprolol-Hydrochlorothiazide) .[name].. 1 By Mouth Once Daily 3)  Neurontin 800 Mg  Tabs (Gabapentin) .[name].. 1 By Mouth Four Times A Day 4)  Doxepin Hcl 100 Mg  Caps (Doxepin Hcl) .[name].. 1 By Mouth At Bedtime 5)  Protonix 20 Mg  Tbec (Pantoprazole Sodium) .[name].. 1 By Mouth Once Daily 6)  Estradiol 1 Mg  Tabs (Estradiol) .[name].. 1 By Mouth Once Daily 7)  Tizanidine Hcl 4 Mg  Tabs (Tizanidine Hcl) .[name].. 1 By Mouth Every 8hours 8)  Omeprazole 20 Mg  Tbec (Omeprazole) .... Take One Tablet Daily Pt Due For Office Visit 9)  Tums 750mg  .... 4 By Mouth Once Daily As Needed 10)  Stool Softner .[name].. 1 By Mouth Once Daily 11)  Aspirin Ec 325 Mg  Tbec (Aspirin) .[name].. 1 By Mouth Once Daily 12)  Mvi .[name].. 1 By Mouth Once Daily 13)  Vit-D3 1000iu .[name].. 1 By Mouth Once Daily 14)  Morphine Sulfate Cr 60 Mg  Tb12 (Morphine Sulfate) .[name].. 1 By Mouth Two Times A Day 15)  Zostavax [address] Unt/0.62ml Solr (Zoster Vaccine Live) .[name].. 1 Ml Im X1 16)  Zocor 40 Mg Tabs (Simvastatin) .... Take One Tablet Each Evening At Bedtime. 17)  Fish Oil 18)  Glucophage Xr 500 Mg Xr24h-Tab (Metformin Hcl) .[name].. 1 By Mouth At Bedtime  Allergies: 1)  ! Sulfa  Past History:  Past medical, surgical, family and social histories (including risk factors) reviewed for relevance to current acute and chronic problems.  Past  Medical History: Hypertension GERD Low back pain----pain management Current Problems:  LOW BACK PAIN (ICD-724.2) PREVENTIVE HEALTH CARE (ICD-V70.0) GERD (ICD-530.81) NEUROPATHY (ICD-355.9) HOT FLASHES (ICD-627.2) INSOMNIA (ICD-780.52) HYPERTENSION (ICD-401.9) Diabetes mellitus, type II  Past Surgical History: Reviewed history from 02/12/2007 and no changes required. Lumbar fusion  Family History: Reviewed history from 09/03/2006 and no changes required. Family History CAD Family History Diabetes 1st degree relative  Social History: Reviewed history from 08/11/2008 and no changes required. Married Never Smoked Alcohol use-no Drug use-no  Review of Systems      See HPI  Physical Exam  General:  Well-developed,well-nourished,in no acute distress; alert,appropriate and cooperative throughout examination Psych:  Oriented X3 and normally interactive.     Impression & Recommendations:  Problem # 1:  DIABETES-TYPE 2 (ICD-250.00) pt instructed to make sure she sees her opthomologist Her updated medication list for this problem includes:    Aspirin Ec 325 Mg Tbec (Aspirin) .[name]... 1 by mouth once daily    Glucophage Xr 500 Mg Xr24h-tab (Metformin hcl) .[name]... 1 by mouth at bedtime  Labs Reviewed: Creat: 0.7 (07/20/2009)    Reviewed HgBA1c results: 7.0 (07/20/2009)  6.9 (01/27/2009)  Orders: Nutrition Referral (Nutrition)  Complete Medication  List: 1)  Amlodipine Besylate 5 Mg Tabs (Amlodipine besylate) .[name].. 1 by mouth once daily 2)  Bisoprolol-hydrochlorothiazide 10-6.25 Mg Tabs (Bisoprolol-hydrochlorothiazide) .[name].. 1 by mouth once daily 3)  Neurontin 800 Mg Tabs (Gabapentin) .[name].. 1 by mouth four times a day 4)  Doxepin Hcl 100 Mg Caps (Doxepin hcl) .[name].. 1 by mouth at bedtime 5)  Protonix 20 Mg Tbec (Pantoprazole sodium) .[name].. 1 by mouth once daily 6)  Estradiol 1 Mg Tabs (Estradiol) .[name].. 1 by mouth once daily 7)  Tizanidine Hcl 4 Mg Tabs (Tizanidine hcl) .[name].. 1 by  mouth every 8hours 8)  Omeprazole 20 Mg Tbec (Omeprazole) .... Take one tablet daily pt due for office visit 9)  Tums 750mg   .... 4 by mouth once daily as needed 10)  Stool Softner  .[name].. 1 by mouth once daily 11)  Aspirin Ec 325 Mg Tbec (Aspirin) .[name].. 1 by mouth once daily 12)  Mvi  .[name].. 1 by mouth once daily 13)  Vit-d3 1000iu  .[name].. 1 by mouth once daily 14)  Morphine Sulfate Cr 60 Mg Tb12 (Morphine sulfate) .[name].. 1 by mouth two times a day 15)  Zostavax 95621 Unt/0.93ml Solr (Zoster vaccine live) .[name].. 1 ml im x1 16)  Zocor 40 Mg Tabs (Simvastatin) .... Take one tablet each evening at bedtime. 17)  Fish Oil  18)  Glucophage Xr 500 Mg Xr24h-tab (Metformin hcl) .[name].. 1 by mouth at bedtime 19)  Onetouch Delica Lancets Misc (Lancets) .... Accu check two times a day 20)  One Touch Ultra Mini Strips  .... Accu check two times a day  Other Orders: Prescription Created Electronically [id number])  Patient Instructions: 1)  check glucose 2 x a day---fasting and 2 hours after a meal--- fax or drop off Blood sugars in about 2 weeks  2)  recheck labs 3 months----250.00, 272.4  lipid, hep, bmp, hgba1c, microalbumin Prescriptions: ONE TOUCH ULTRA MINI STRIPS accu check two times a day  #60 x 11   Entered and Authorized by:   Loreen Freud DO   Signed by:   Loreen Freud DO on 08/10/2009   Method used:   F[facility]ed to ...       [facility]       [address]       [place], [place]  [address]       Ph: [phone]       Fax: [phone]   RxID:   [id number] Dola Argyle LANCETS  MISC (LANCETS) accu check two times a day  #60 x 11   Entered and Authorized by:   Loreen Freud DO   Signed by:   Loreen Freud DO on 08/10/2009   Method used:   Electronically to        [facility] (retail)       [address]       [place], [place]  [address]       Ph: [phone]       Fax: [phone]   RxID:   [id number] GLUCOPHAGE XR 500  MG XR24H-TAB (METFORMIN HCL) 1 by mouth at bedtime  #30 x 2   Entered and Authorized by:   Loreen Freud DO   Signed by:   Loreen Freud DO on 08/10/2009   Method used:   Electronically to        [facility] (retail)  [address]       [place], [place]  [address]       Ph: [phone]       Fax: [phone]   RxID:   [id number]

## 2010-05-10 NOTE — Progress Notes (Signed)
Summary: med increase  Phone Note Refill Request Call back at Home Phone [phone] Message from:  Patient  Refills Requested: Medication #1:  OMEPRAZOLE 20 MG  TBEC Take one tablet daily pt due for office visit Pt would like to have med increase to 40mg  since she is still having problem with heartburn. Pls advise.............Marland KitchenFelecia Deloach CMA  February 14, 2010 2:32 PM    Follow-up for Phone Call        If she is having problems she should be seen ----  she may need GI referral---also make sure she is not having any cardiac symptoms Follow-up by: Loreen Freud DO,  February 14, 2010 2:54 PM  Additional Follow-up for Phone Call Additional follow up Details #1::        spk with patient and she denied any cardiac issues, says her husbands pcp put him on 98 and figured Dr.Lowne would do the same, says she would like the GI referral since she is not getting much better.  at this time no chest pain, jaw pain, or SOB. Additional Follow-up by: Almeta Monas CMA Duncan Dull),  February 14, 2010 3:42 PM    Additional Follow-up for Phone Call Additional follow up Details #2::    referral put in ---- pt can start omeprazole 40 mg #30  1 by mouth once daily   11 refills Follow-up by: Loreen Freud DO,  February 14, 2010 3:59 PM  Additional Follow-up for Phone Call Additional follow up Details #3:: Details for Additional Follow-up Action Taken: Pt aware of the above...[facility] Almeta Monas CMA Duncan Dull)  February 14, 2010 4:10 PM   New/Updated Medications: OMEPRAZOLE 40 MG CPDR (OMEPRAZOLE) 1 by mouth once daily Prescriptions: OMEPRAZOLE 40 MG CPDR (OMEPRAZOLE) 1 by mouth once daily  #30 x 11   Entered by:   Almeta Monas CMA (AAMA)   Authorized by:   Loreen Freud DO   Signed by:   Almeta Monas CMA (AAMA) on 02/14/2010   Method used:   Electronically to        [facility]ail)       [address]       [place], [place]  [address]       Ph: [phone]  Fax: [phone]   RxID:   [id number]

## 2010-05-10 NOTE — Assessment & Plan Note (Signed)
Summary: stomach issues///sph   Vital Signs:  Patient profile:   65 year old female Menstrual status:  hysterectomy Weight:      188.6 pounds Temp:     98.4 degrees F oral Pulse rate:   80 / minute Pulse rhythm:   regular BP sitting:   128 / 76  (left arm) Cuff size:   regular  Vitals Entered By: Almeta Monas CMA Duncan Dull) (February 28, 2010 11:38 AM) CC: x2days c/o nausea, diarrhea and stomach rumbling-- also has a sinus infection, URI symptoms   History of Present Illness: Pt c/o Nausea , diarrhea for 2 days.          This is a 65 year old woman who presents with URI symptoms.  The symptoms began 1 week ago.  The patient complains of nasal congestion, purulent nasal discharge, productive cough, earache, and sick contacts.  The patient denies fever, low-grade fever (<100.5 degrees), fever of 100.5-103 degrees, fever of 103.1-104 degrees, fever to >104 degrees, stiff neck, dyspnea, wheezing, rash, vomiting, diarrhea, use of an antipyretic, and response to antipyretic.  The patient also reports headache.  The patient denies itchy watery eyes, itchy throat, sneezing, seasonal symptoms, response to antihistamine, muscle aches, and severe fatigue.  The patient denies the following risk factors for Strep sinusitis: unilateral facial pain, unilateral nasal discharge, poor response to decongestant, double sickening, tooth pain, Strep exposure, tender adenopathy, and absence of cough.  No otc meds.   Current Medications (verified): 1)  Amlodipine Besylate 5 Mg  Tabs (Amlodipine Besylate) .[name].. 1 By Mouth Once Daily 2)  Bisoprolol-Hydrochlorothiazide 10-6.25 Mg  Tabs (Bisoprolol-Hydrochlorothiazide) .[name].. 1 By Mouth Once Daily 3)  Neurontin 800 Mg  Tabs (Gabapentin) .[name].. 1 By Mouth Four Times A Day 4)  Doxepin Hcl 100 Mg  Caps (Doxepin Hcl) .[name].. 1 By Mouth At Bedtime 5)  Protonix 40 Mg Tbec (Pantoprazole Sodium) .[name].. 1 By Mouth Once Daily 6)  Estradiol 1 Mg  Tabs (Estradiol) .[name].. 1 By Mouth Once  Daily 7)  Tizanidine Hcl 4 Mg  Tabs (Tizanidine Hcl) .[name].. 1 By Mouth Every 8hours 8)  Omeprazole 40 Mg Cpdr (Omeprazole) .[name].. 1 By Mouth Once Daily 9)  Tums 750mg  .... 4 By Mouth Once Daily As Needed 10)  Stool Softner .[name].. 1 By Mouth Once Daily 11)  Aspirin Ec 325 Mg  Tbec (Aspirin) .[name].. 1 By Mouth Once Daily 12)  Mvi .[name].. 1 By Mouth Once Daily 13)  Vit-D3 1000iu .[name].. 1 By Mouth Once Daily 14)  Morphine Sulfate Cr 60 Mg  Tb12 (Morphine Sulfate) .[name].. 1 By Mouth Two Times A Day 15)  Zocor 40 Mg Tabs (Simvastatin) .... Take One Tablet Each Evening At Bedtime. 16)  Fish Oil 17)  Glucophage Xr 500 Mg Xr24h-Tab (Metformin Hcl) .[name].. 1 By Mouth At Bedtime 18)  Onetouch Delica Lancets  Misc (Lancets) .... Accu Check Two Times A Day 19)  One Touch Ultra Mini Strips .... Accu Check Two Times A Day 20)  Zithromax Z-Pak 250 Mg Tabs (Azithromycin) .... As Directed 21)  Flonase 50 Mcg/act Susp (Fluticasone Propionate) .... 2 Sprays Each Nostril Once Daily  Allergies (verified): 1)  ! 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 Family History CAD Family History Diabetes 1st  degree relative  Social History: Reviewed history from 08/11/2008 and no changes required. Married Never Smoked Alcohol use-no Drug use-no  Review of Systems      See HPI  Physical Exam  General:  Well-developed,well-nourished,in no acute distress; alert,appropriate and cooperative throughout examination Ears:  External ear exam shows no significant lesions or deformities.  Otoscopic examination reveals clear canals, tympanic membranes are intact bilaterally without bulging, retraction, inflammation or discharge. Hearing is grossly normal bilaterally. Nose:  L frontal sinus tenderness, L maxillary sinus tenderness, R frontal sinus tenderness, and R maxillary sinus tenderness.   Mouth:  pharyngeal erythema and postnasal drip.   Neck:  No deformities, masses, or tenderness noted. Lungs:  Normal respiratory effort, chest expands symmetrically. Lungs are clear to auscultation, no crackles or wheezes. Heart:  Normal rate and regular rhythm. S1 and S2 normal without gallop, murmur, click, rub or other extra sounds. Abdomen:  Bowel sounds positive,abdomen soft and non-tender without masses, organomegaly or hernias noted. Skin:  Intact without suspicious lesions or rashes Psych:  Cognition and judgment appear intact. Alert and cooperative with normal attention span and concentration. No apparent delusions, illusions, hallucinations   Impression & Recommendations:  Problem # 1:  OTHER ACUTE SINUSITIS (ICD-461.8)  Her updated medication list for this problem includes:    Zithromax Z-pak 250 Mg Tabs (Azithromycin) .[name]... As directed-----pt states zpak works well normall    Flonase 50 Mcg/act Susp (Fluticasone propionate) .[name]... 2 sprays each nostril once daily  Instructed on treatment. Call if symptoms persist or worsen.   Problem # 2:  NAUSEA (ICD-787.02)  Orders: Gastroenterology Referral (GI)  Discussed symptom control.   Problem # 3:  GERD (ICD-530.81)  Her updated medication list  for this problem includes:    Protonix 40 Mg Tbec (Pantoprazole sodium) .[name]... 1 by mouth once daily    Omeprazole 40 Mg Cpdr (Omeprazole) .[name]... 1 by mouth once daily  Orders: Gastroenterology Referral (GI)  Diagnostics Reviewed:  Discussed lifestyle modifications, diet, antacids/medications, and preventive measures. Handout provided.   Complete Medication List: 1)  Amlodipine Besylate 5 Mg Tabs (Amlodipine besylate) .[name].. 1 by mouth once daily 2)  Bisoprolol-hydrochlorothiazide 10-6.25 Mg Tabs (Bisoprolol-hydrochlorothiazide) .[name].. 1 by mouth once daily 3)  Neurontin 800 Mg Tabs (Gabapentin) .[name].. 1 by mouth four times a day 4)  Doxepin Hcl 100 Mg Caps (Doxepin hcl) .[name].. 1 by mouth at bedtime 5)  Protonix 40 Mg Tbec (Pantoprazole sodium) .[name].. 1 by mouth once daily 6)  Estradiol 1 Mg Tabs (Estradiol) .[name].. 1 by mouth once daily 7)  Tizanidine Hcl 4 Mg Tabs (Tizanidine hcl) .[name].. 1 by mouth every 8hours 8)  Omeprazole 40 Mg Cpdr (Omeprazole) .[name].. 1 by mouth once daily 9)  Tums 750mg   .... 4 by mouth once daily as needed 10)  Stool Softner  .[name].. 1 by mouth once daily 11)  Aspirin Ec 325 Mg Tbec (Aspirin) .[name].. 1 by mouth once daily 12)  Mvi  .[name].. 1 by mouth once daily 13)  Vit-d3 1000iu  .[name].. 1 by mouth once daily 14)  Morphine Sulfate Cr 60 Mg Tb12 (Morphine  sulfate) .[name].. 1 by mouth two times a day 15)  Zocor 40 Mg Tabs (Simvastatin) .... Take one tablet each evening at bedtime. 16)  Fish Oil  17)  Glucophage Xr 500 Mg Xr24h-tab (Metformin hcl) .[name].. 1 by mouth at bedtime 18)  Onetouch Delica Lancets Misc (Lancets) .... Accu check two times a day 19)  One Touch Ultra Mini Strips  .... Accu check two times a day 20)  Zithromax Z-pak 250 Mg Tabs (Azithromycin) .... As directed 21)  Flonase 50 Mcg/act Susp (Fluticasone propionate) .... 2 sprays each nostril once daily Prescriptions: FLONASE 50 MCG/ACT SUSP (FLUTICASONE PROPIONATE) 2 sprays each nostril once daily  #1 x 2   Entered and  Authorized by:   Loreen Freud DO   Signed by:   Loreen Freud DO on 02/28/2010   Method used:   Electronically to        [id number] (retail)       [address] [place], [place]  [address]       Ph: [phone]       Fax: [phone]   RxID:   [id number] ZITHROMAX Z-PAK 250 MG TABS (AZITHROMYCIN) as directed  #1 x 0   Entered and Authorized by:   Loreen Freud DO   Signed by:   Loreen Freud DO on 02/28/2010   Method used:   Electronically to        [id number] (retail)       [address] [place], [place]  [address]       Ph: [phone]       Fax: [phone]   RxID:   [id number]    Orders Added: 1)  Gastroenterology Referral [GI] 2)  Est. Patient Level III [phone]

## 2010-05-10 NOTE — Miscellaneous (Signed)
Summary: Zostavax/[facility]  [facility]   Imported By: Lanelle Bal 01/04/2010 10:51:05  _____________________________________________________________________  External Attachment:    Type:   Image     Comment:   External Document

## 2010-05-10 NOTE — Progress Notes (Signed)
Summary: BS READINGS FROM 5/5 THROUGH 5/19  Phone Note Call from Patient Call back at [facility] Phone [phone]   Caller: Patient Summary of Call: PATIENT DROPPED OFF HANDWRITTEN BLOOD SUGAR READINGS FROM 5/5 THROUGH 5/19---  SHE DOES NOT NEED ANY FOLLOWUP PHONE CALL UNLESS DR LOWNE FEELS THAT PHONE CALL IS NECESSARY  WILL TAKE TO [facility] IN PLASTIC SLEEVE Initial call taken by: Jerolyn Shin,  Aug 25, 2009 4:38 PM  Follow-up for Phone Call        2-hour after meal breakfast/lunch/dinner 5-5-162 dinner 5-6-176 lunch,121 dinner 5-7-175 breakfast     fasting-153 5-8-167dinner     fasting 103 5-9-150 lunch     fasting 118 08-15-96 dinner     fasting 96 5-11-189 lunch    fasting-93  5-12-100 breakfast    fasting 110 5-13-167 dinner    fasting 100 5-14-138 breakfast    fasting 103 5-15-126 lunch    fasting 117 5-16-124 breakfast    fasting 103 5-17-140 dinner   fasting 103 5-18-137 breakfast    fasting 109 5-19-134    fasting 102 Follow-up by: Jeremy Johann CMA,  Aug 25, 2009 5:00 PM  Additional Follow-up for Phone Call Additional follow up Details #1::        great !  con't glucophage---recheck labs 3 months from last ones done Additional Follow-up by: Loreen Freud DO,  Aug 25, 2009 5:12 PM    Additional Follow-up for Phone Call Additional follow up Details #2::    left message for pt, informed on message dr Hulan Saas response. Army Fossa CMA  Aug 28, 2009 8:22 AM

## 2010-05-10 NOTE — Progress Notes (Signed)
Summary: pt has diarrhea  Phone Note Call from Patient   Caller: Patient Call For: Loreen Freud DO Summary of Call: call from patient and she stated she started having diarrhea and wanted to know what she should do, stated she took immodium AD with no relief, I advised not to take anymore immodium, try ginger ale, bland food, like a Brat diet, I explained what the brat diet was and advise not to eat big meals and try sips of fluid, advised no acidic food or drink and no milk. I advised if no better in 24 hours to give [place] a call back, she voiced understanding. Initial call taken by: Almeta Monas CMA Duncan Dull),  May 02, 2010 2:10 PM

## 2010-05-11 NOTE — Medication Information (Signed)
Summary: ACEI/ARB Use in Patients with Diabetes Hypertension or Nephropat  ACEI/ARB Use in Patients with Diabetes Hypertension or Nephropathy/[employer]   Imported By: Lanelle Bal 02/13/2010 12:55:40  _____________________________________________________________________  External Attachment:    Type:   Image     Comment:   External Document

## 2010-05-16 ENCOUNTER — Telehealth: Payer: Self-pay | Admitting: Gastroenterology

## 2010-05-16 NOTE — Assessment & Plan Note (Signed)
History of Present Illness Visit Type: Initial Visit Primary GI MD: Rob Bunting MD Primary Provider: Loreen Freud, MD Chief Complaint: bloating & diarrhea History of Present Illness:     very pleasant 65 year old woman who is here with her husband today. who had a diarrheal illness recently.  lasted for 2-3 days.  last week.  Probably had 10-15 loose stools a day.  Non-bloody.  Her husband had similar issues, but less severe.  No vomiting, no nausea.  She has never had a colonoscopy.  She has a lot of belching, gas pains, flatus.  These are worse than usual.   she has upper abdominal discomforts and has had these for quite a long time, they can be intermittent, sometimes related to eating. She has lost 10 pounds in 2 weeks.  No NSAIDs, takes tums periodically.  Takes protonix, has been for years.           Current Medications (verified): 1)  Amlodipine Besylate 5 Mg  Tabs (Amlodipine Besylate) .[name].. 1 By Mouth Once Daily 2)  Bisoprolol-Hydrochlorothiazide 10-6.25 Mg  Tabs (Bisoprolol-Hydrochlorothiazide) .[name].. 1 By Mouth Once Daily 3)  Neurontin 800 Mg  Tabs (Gabapentin) .[name].. 1 By Mouth Four Times A Day 4)  Doxepin Hcl 100 Mg  Caps (Doxepin Hcl) .[name].. 1 By Mouth At Bedtime 5)  Protonix 40 Mg Tbec (Pantoprazole Sodium) .[name].. 1 By Mouth Once Daily 6)  Estradiol 1 Mg  Tabs (Estradiol) .[name].. 1 By Mouth Once Daily 7)  Tizanidine Hcl 4 Mg  Tabs (Tizanidine Hcl) .[name].. 1 By Mouth Every 8hours 8)  Tums 750mg  .... 4 By Mouth Once Daily As Needed 9)  Stool Softner .[name].. 1 By Mouth Once Daily 10)  Aspirin Ec 325 Mg  Tbec (Aspirin) .[name].. 1 By Mouth Once Daily 11)  Mvi .[name].. 1 By Mouth Once Daily 12)  Vit-D3 1000iu .[name].. 1 By Mouth Once Daily 13)  Morphine Sulfate 15 Mg Tabs (Morphine Sulfate) .... Take 1 Tablet 5 Times Daily 14)  Zocor 40 Mg Tabs (Simvastatin) .... Take One Tablet Each Evening At Bedtime. 15)  Fish Oil 16)  Glucophage Xr 500 Mg Xr24h-Tab (Metformin Hcl) .[name].. 1 By Mouth At  Bedtime 17)  Onetouch Delica Lancets  Misc (Lancets) .... Accu Check Two Times A Day 18)  One Touch Ultra Mini Strips .... Accu Check Two Times A Day 19)  Flonase 50 Mcg/act Susp (Fluticasone Propionate) .... 2 Sprays Each Nostril Once Daily 20)  Vivelle-Dot 0.025 Mg/24hr Pttw (Estradiol) .... As Directed Twice Weekly  Allergies (verified): 1)  ! Sulfa  Past History:  Past Medical History: Hypertension GERD Low back pain----pain management Current Problems:  LOW BACK PAIN (ICD-724.2) PREVENTIVE HEALTH CARE (ICD-V70.0) GERD (ICD-530.81) NEUROPATHY (ICD-355.9) HOT FLASHES (ICD-627.2) INSOMNIA (ICD-780.52) HYPERTENSION (ICD-401.9) Diabetes mellitus, type II    Past Surgical History: Lumbar fusion    Family History: Family History CAD Family History Diabetes 1st degree relative  no colon cancer  Social History: Married Never Smoked Alcohol use-no Drug use-no   Review of Systems       Pertinent positive and negative review of systems were noted in the above HPI and GI specific review of systems.  All other review of systems was otherwise negative.   Vital Signs:  Patient profile:   65 year old female Menstrual status:  hysterectomy Height:      70 inches Weight:      183.25 pounds BMI:     26.39 Pulse rate:   68 / minute Pulse  rhythm:   regular BP sitting:   120 / 72  (left arm) Cuff size:   regular  Vitals Entered By: June McMurray CMA Duncan Dull) (May 08, 2010 3:03 PM)  Physical Exam  Additional Exam:  Constitutional: generally well appearing Psychiatric: alert and oriented times 3 Eyes: extraocular movements intact Mouth: oropharynx moist, no lesions Neck: supple, no lymphadenopathy Cardiovascular: heart regular rate and rythm Lungs: CTA bilaterally Abdomen: soft, non-tender, non-distended, no obvious ascites, no peritoneal signs, normal bowel sounds Extremities: no lower extremity edema bilaterally Skin: no lesions on visible  extremities    Impression & Recommendations:  Problem # 1:  routine risk for colon cancer, dyspepsia, recent diarrheal illness I think we should proceed with both upper and lower endoscopies. She has significant dyspepsia, belching, upper abdominal pains and also a recent diarrheal illness was likely infectious. She is at routine risk for colon cancer but is never had a colonoscopy. I don't see any reason for any further blood tests or imaging studies prior to those procedures.  Patient Instructions: 1)  You will be scheduled to have an upper endoscopy. 2)  You will be scheduled to have a colonoscopy. 3)  The medication list was reviewed and reconciled.  All changed / newly prescribed medications were explained.  A complete medication list was provided to the patient / caregiver.  Appended Document: Orders Update/Movi    Clinical Lists Changes  Problems: Added new problem of DYSPEPSIA&OTHER SPEC DISORDERS FUNCTION STOMACH (ICD-536.8) Added new problem of DIARRHEA (ICD-787.91) Medications: Added new medication of MOVIPREP 100 GM  SOLR (PEG-KCL-NACL-NASULF-NA ASC-C) As per prep instructions. - Signed Rx of MOVIPREP 100 GM  SOLR (PEG-KCL-NACL-NASULF-NA ASC-C) As per prep instructions.;  #1 x 0;  Signed;  Entered by: Chales Abrahams CMA (AAMA);  Authorized by: Rachael Fee MD;  Method used: Electronically to [facility] [address], [place], [place], [place]  [address], Ph: [phone], Fax: [phone] Orders: Added new Test order of Colonoscopy (Colon) - Signed    Prescriptions: MOVIPREP 100 GM  SOLR (PEG-KCL-NACL-NASULF-NA ASC-C) As per prep instructions.  #1 x 0   Entered by:   Chales Abrahams CMA (AAMA)   Authorized by:   Rachael Fee MD   Signed by:   Chales Abrahams CMA (AAMA) on 05/08/2010   Method used:   Electronically to        [facility] (retail)       [address]       [place], [place]  [address]       Ph: [phone]        Fax: [phone]   RxID:   [id number]

## 2010-05-16 NOTE — Letter (Signed)
Summary: [facility] Instructions  [facility]  [address] [place], [place] [address]   Phone: [phone]  Fax: [phone]       Brittany [name]    Jun 22, 1959    MRN: [id number]        Procedure Day /Date:06/12/10 TUE     Arrival Time:1230 pm     Procedure Time:130 pm     Location of Procedure:                    X  [facility] (4th Floor)                        PREPARATION FOR COLONOSCOPY WITH MOVIPREP   Starting 5 days prior to your procedure 06/07/10 do not eat nuts, seeds, popcorn, corn, beans, peas,  salads, or any raw vegetables.  Do not take any fiber supplements (e.g. Metamucil, Citrucel, and Benefiber).  THE DAY BEFORE YOUR PROCEDURE         DATE: 06/11/10  DAY: MON 1.  Drink clear liquids the entire day-NO SOLID FOOD  2.  Do not drink anything colored red or purple.  Avoid juices with pulp.  No orange juice.  3.  Drink at least 64 oz. (8 glasses) of fluid/clear liquids during the day to prevent dehydration and help the prep work efficiently.  CLEAR LIQUIDS INCLUDE: Water Jello Ice Popsicles Tea (sugar ok, no milk/cream) Powdered fruit flavored drinks Coffee (sugar ok, no milk/cream) Gatorade Juice: apple, white grape, white cranberry  Lemonade Clear bullion, consomm, broth Carbonated beverages (any kind) Strained chicken noodle soup Hard Candy                             4.  In the morning, mix first dose of MoviPrep solution:    Empty 1 Pouch A and 1 Pouch B into the disposable container    Add lukewarm drinking water to the top line of the container. Mix to dissolve    Refrigerate (mixed solution should be used within 24 hrs)  5.  Begin drinking the prep at 5:00 p.m. The MoviPrep container is divided by 4 marks.   Every 15 minutes drink the solution down to the next mark (approximately 8 oz) until the full liter is complete.   6.  Follow completed prep with 16 oz of clear liquid of your choice (Nothing red or purple).   Continue to drink clear liquids until bedtime.  7.  Before going to bed, mix second dose of MoviPrep solution:    Empty 1 Pouch A and 1 Pouch B into the disposable container    Add lukewarm drinking water to the top line of the container. Mix to dissolve    Refrigerate  THE DAY OF YOUR PROCEDURE      DATE: 06/12/10 DAY: TUE  Beginning at 830 a.m. (5 hours before procedure):         1. Every 15 minutes, drink the solution down to the next mark (approx 8 oz) until the full liter is complete.  2. Follow completed prep with 16 oz. of clear liquid of your choice.    3. You may drink clear liquids until 1130 am (2 HOURS BEFORE PROCEDURE).   MEDICATION INSTRUCTIONS  Unless otherwise instructed, you should take regular prescription medications with a small sip of water   as early as possible the morning of your procedure.  Diabetic patients -  see separate instructions.           OTHER INSTRUCTIONS  You will need a responsible adult at least 63 years of age to accompany you and drive you home.   This person must remain in the waiting room during your procedure.  Wear loose fitting clothing that is easily removed.  Leave jewelry and other valuables at home.  However, you may wish to bring a book to read or  an iPod/MP3 player to listen to music as you wait for your procedure to start.  Remove all body piercing jewelry and leave at home.  Total time from sign-in until discharge is approximately 2-3 hours.  You should go home directly after your procedure and rest.  You can resume normal activities the  day after your procedure.  The day of your procedure you should not:   Drive   Make legal decisions   Operate machinery   Drink alcohol   Return to work  You will receive specific instructions about eating, activities and medications before you leave.    The above instructions have been reviewed and explained to me by   _______________________    I fully  understand and can verbalize these instructions _____________________________ Date _________

## 2010-05-16 NOTE — Letter (Signed)
Summary: Diabetic Instructions  [facility]  [address] [place], [place] [address]   Phone: [phone]  Fax: [phone]    DHWANI VENKATESH 01-07-1946 MRN: [id number]   X   ORAL DIABETIC MEDICATION INSTRUCTIONS  The day before your procedure:   Take your diabetic pill as you do normally  The day of your procedure:   Do not take your diabetic pill    We will check your blood sugar levels during the admission process and again in Recovery before discharging you home  ________________________________________________________________________

## 2010-05-17 ENCOUNTER — Telehealth: Payer: Self-pay | Admitting: Family Medicine

## 2010-05-18 ENCOUNTER — Telehealth (INDEPENDENT_AMBULATORY_CARE_PROVIDER_SITE_OTHER): Payer: Self-pay | Admitting: *Deleted

## 2010-05-21 ENCOUNTER — Encounter: Payer: PRIVATE HEALTH INSURANCE | Attending: Physical Medicine & Rehabilitation

## 2010-05-21 ENCOUNTER — Ambulatory Visit (HOSPITAL_BASED_OUTPATIENT_CLINIC_OR_DEPARTMENT_OTHER): Payer: PRIVATE HEALTH INSURANCE | Admitting: Physical Medicine & Rehabilitation

## 2010-05-21 DIAGNOSIS — G5690 Unspecified mononeuropathy of unspecified upper limb: Secondary | ICD-10-CM | POA: Insufficient documentation

## 2010-05-21 DIAGNOSIS — E119 Type 2 diabetes mellitus without complications: Secondary | ICD-10-CM | POA: Insufficient documentation

## 2010-05-21 DIAGNOSIS — R209 Unspecified disturbances of skin sensation: Secondary | ICD-10-CM

## 2010-05-21 DIAGNOSIS — G5790 Unspecified mononeuropathy of unspecified lower limb: Secondary | ICD-10-CM | POA: Insufficient documentation

## 2010-05-21 DIAGNOSIS — I10 Essential (primary) hypertension: Secondary | ICD-10-CM | POA: Insufficient documentation

## 2010-05-21 DIAGNOSIS — G609 Hereditary and idiopathic neuropathy, unspecified: Secondary | ICD-10-CM

## 2010-05-21 DIAGNOSIS — Z79899 Other long term (current) drug therapy: Secondary | ICD-10-CM | POA: Insufficient documentation

## 2010-05-24 NOTE — Progress Notes (Signed)
Summary: prep  Phone Note Call from Patient Call back at Home Phone [phone]   Caller: Patient Call For: Dr. Christella Hartigan Reason for Call: Talk to Nurse Summary of Call: Pt can not afford prep and needs something else called in Initial call taken by: [place] Johnson,  May 16, 2010 1:02 PM  Follow-up for Phone Call        sample left at front  pt aware Follow-up by: Chales Abrahams CMA Duncan Dull),  May 17, 2010 8:32 AM

## 2010-05-24 NOTE — Progress Notes (Signed)
Summary: med too expensive  Phone Note Refill Request Call back at Home Phone [phone]   Refills Requested: Medication #1:  VIVELLE-DOT 0.025 MG/24HR PTTW as directed twice weekly Pt states that med is too expensive. Pt was told that she could try estradiol as a alternative. Pt would like to be changes to a cheaper med. Pls advise...........Marland KitchenFelecia Deloach CMA  May 17, 2010 11:36 AM    Follow-up for Phone Call        estradiol 0.5 mg 1 by mouth once daily  #30  2 refills---pt due cpe in May Follow-up by: Loreen Freud DO,  May 17, 2010 12:08 PM  Additional Follow-up for Phone Call Additional follow up Details #1::        Pt is already taking Estradiol 1mg  daily- do you want her to decrease dosing?  Additional Follow-up by: Army Fossa CMA,  May 17, 2010 2:53 PM    Additional Follow-up for Phone Call Additional follow up Details #2::    the note said she was taking vivelle dot and its too expensive so estradiol was requested.  ---so I'm not sure what the pt was asking for   Follow-up by: Loreen Freud DO,  May 17, 2010 4:06 PM  Additional Follow-up for Phone Call Additional follow up Details #3:: Details for Additional Follow-up Action Taken: I spoke with patient and she stated she is using Both Vivelle Dot and the Estradiol together, stated she wants to get something cheaper to replace the Vivelle Dot, patient stated taking the vivelle dot because the estradiol was causing her to have dizziness, wants to know if she needed to increase the estradiol and stop the Vivelle dot.Marland KitchenMarland KitchenMarland KitchenWants RX called to [facility] [address]... Almeta Monas CMA Duncan Dull)  May 17, 2010 4:18 PM  cenestin 0.625mg   #30  1 by mouth once daily  5 refills  yrlowne 05/17/2010  445p  pt advised to stop all other meds and start the one above, call with any problems or concerns, she voiced understaning.... Almeta Monas CMA Duncan Dull)  May 17, 2010 4:48 PM   New/Updated  Medications: CENESTIN 0.625 MG TABS (ESTROGENS CONJ SYNTHETIC A) 1 by mouth once daily Prescriptions: CENESTIN 0.625 MG TABS (ESTROGENS CONJ SYNTHETIC A) 1 by mouth once daily  #30 x 5   Entered by:   Almeta Monas CMA (AAMA)   Authorized by:   Loreen Freud DO   Signed by:   Almeta Monas CMA (AAMA) on 05/17/2010   Method used:   Faxed to ...       [facility]       [address]       [place], [place]  [address]       Ph: [phone]       Fax: [phone]   RxID:   [id number]

## 2010-05-30 NOTE — Progress Notes (Signed)
Summary: med too expensive  Phone Note Refill Request   Refills Requested: Medication #1:  CENESTIN 0.625 MG TABS 1 by mouth once daily Pt states that Rx we sent in is too expensive at $75. The Pt would like to get a cheaper alternative. Pls advise...........Marland KitchenFelecia Deloach CMA  May 18, 2010 2:27 PM    Follow-up for Phone Call        pt is going to have to bring formulary in Follow-up by: Loreen Freud DO,  May 18, 2010 2:44 PM  Additional Follow-up for Phone Call Additional follow up Details #1::        spoke w/ patient husband will have patient contact office when she gets in if not today then on monday.Marland KitchenMarland KitchenMarland KitchenDoristine Devoid CMA  May 18, 2010 4:09 PM     Additional Follow-up for Phone Call Additional follow up Details #2::    Discuss with patient will bring copy of formulary to office.Marland KitchenMarland KitchenFelecia Deloach CMA  May 21, 2010 4:38 PM

## 2010-06-11 ENCOUNTER — Telehealth: Payer: Self-pay | Admitting: Gastroenterology

## 2010-06-12 ENCOUNTER — Encounter (AMBULATORY_SURGERY_CENTER): Payer: PRIVATE HEALTH INSURANCE | Admitting: Gastroenterology

## 2010-06-12 ENCOUNTER — Other Ambulatory Visit: Payer: Self-pay | Admitting: Gastroenterology

## 2010-06-12 DIAGNOSIS — D126 Benign neoplasm of colon, unspecified: Secondary | ICD-10-CM

## 2010-06-12 DIAGNOSIS — R1013 Epigastric pain: Secondary | ICD-10-CM

## 2010-06-12 DIAGNOSIS — K2940 Chronic atrophic gastritis without bleeding: Secondary | ICD-10-CM

## 2010-06-12 DIAGNOSIS — Z1211 Encounter for screening for malignant neoplasm of colon: Secondary | ICD-10-CM

## 2010-06-12 DIAGNOSIS — K3189 Other diseases of stomach and duodenum: Secondary | ICD-10-CM

## 2010-06-12 DIAGNOSIS — K2970 Gastritis, unspecified, without bleeding: Secondary | ICD-10-CM

## 2010-06-12 LAB — GLUCOSE, CAPILLARY
Glucose-Capillary: 109 mg/dL — ABNORMAL HIGH (ref 70–99)
Glucose-Capillary: 103 mg/dL — ABNORMAL HIGH (ref 70–99)

## 2010-06-19 ENCOUNTER — Encounter: Payer: Self-pay | Admitting: Gastroenterology

## 2010-06-19 NOTE — Procedures (Addendum)
Summary: Upper Endoscopy  Patient: [name] Note: All result statuses are Final unless otherwise noted.  Tests: (1) Upper Endoscopy (EGD)   EGD Upper Endoscopy       DONE     [facility]     [address]     [place], [place]  [address]          ENDOSCOPY PROCEDURE REPORT          PATIENT:  Brittany [name], Brittany [name]  MR#:  [id number]     BIRTHDATE:  February 07, 1946, 64 yrs. old  GENDER:  female     ENDOSCOPIST:  Rachael Fee, MD     PROCEDURE DATE:  06/12/2010     PROCEDURE:  EGD with biopsy, 19147     ASA CLASS:  Class II     INDICATIONS:  dyspepsia     MEDICATIONS:  There was residual sedation effect present from     prior procedure., Fentanyl 25 mcg IV, Versed 2 mg IV     TOPICAL ANESTHETIC:  Exactacain Spray          DESCRIPTION OF PROCEDURE:   After the risks benefits and     alternatives of the procedure were thoroughly explained, informed     consent was obtained.  The LB GIF-H180 [id number] endoscope was     introduced through the mouth and advanced to the second portion of     the duodenum, without limitations.  The instrument was slowly     withdrawn as the mucosa was fully examined.     <<PROCEDUREIMAGES>>     There was mild to moderate, non-specific gastritis. Biopsies taken     to check for H. pylori (see image4).  Otherwise the examination     was normal (see image3, image2, image5, and image6).     Retroflexed views revealed no abnormalities.    The scope was then     withdrawn from the patient and the procedure completed.     COMPLICATIONS:  None          ENDOSCOPIC IMPRESSION:     1) Mild to moderate gastritis; biopsied to check for H. pylori     2) Otherwise normal examination          RECOMMENDATIONS:     If biopsies show H. pylori, you will be started on the     appropriate antibiotics.     Daily narcotic pain meds can cause abd disomfort by slowing     gastrointestinal function. Try to cut back on these as best that     you can.       ______________________________     Rachael Fee, MD          cc: Loreen Freud, MD          n.     eSIGNED:   Rachael Fee at 06/12/2010 02:08 PM          Theola Sequin, [id number]  Note: An exclamation mark (!) indicates a result that was not dispersed into the flowsheet. Document Creation Date: 06/12/2010 2:08 PM _______________________________________________________________________  (1) Order result status: Final Collection or observation date-time: 06/12/2010 14:03 Requested date-time:  Receipt date-time:  Reported date-time:  Referring Physician:   Ordering Physician: Rob Bunting [phone]) Specimen Source:  Source: Launa [employer]ill Order Number: [id number] Lab site:

## 2010-06-19 NOTE — Procedures (Addendum)
Summary: Colonoscopy  Patient: Sakari Raisanen Note: All result statuses are Final unless otherwise noted.  Tests: (1) Colonoscopy (COL)   COL Colonoscopy           DONE     [facility]     [address]     [place], [place]  [address]          COLONOSCOPY PROCEDURE REPORT          PATIENT:  Latajah, Thuman  MR#:  [id number]     BIRTHDATE:  [birth year], 64 yrs. old  GENDER:  female     ENDOSCOPIST:  Rachael Fee, MD     REF. BY:  Loreen Freud, DO     PROCEDURE DATE:  06/12/2010     PROCEDURE:  Colonoscopy with snare polypectomy     ASA CLASS:  Class II     INDICATIONS:  Routine Risk Screening     MEDICATIONS:   Fentanyl 75 mcg IV, Versed 8 mg IV          DESCRIPTION OF PROCEDURE:   After the risks benefits and     alternatives of the procedure were thoroughly explained, informed     consent was obtained.  Digital rectal exam was performed and     revealed no rectal masses.   The LB PCF-H180AL [id number] endoscope     was introduced through the anus and advanced to the cecum, which     was identified by both the appendix and ileocecal valve, without     limitations.  The quality of the prep was good, using MoviPrep.     The instrument was then slowly withdrawn as the colon was fully     examined.     <<PROCEDUREIMAGES>>     FINDINGS:  A sessile polyp was found in the sigmoid colon. This     was 5mm across, removed with cold snare and sent to pathology (jar     1) (see image3).  External Hemorrhoids were found.  This was     otherwise a normal examination of the colon (see image1, image2,     and image4).   Retroflexed views in the rectum revealed no     abnormalities.    The scope was then withdrawn from the patient     and the procedure completed.     COMPLICATIONS:  None          ENDOSCOPIC IMPRESSION:     1) Small sessile polyp in the sigmoid colon; removed and sent to     pathology     2) External hemorrhoids     3) Otherwise normal examination       RECOMMENDATIONS:     1) If the polyp(s) removed today are proven to be adenomatous     (pre-cancerous) polyps, you will need a repeat colonoscopy in 5     years. Otherwise you should continue to follow colorectal cancer     screening guidelines for "routine risk" patients with colonoscopy     in 10 years.     2) You will receive a letter within 1-2 weeks with the results     of your biopsy as well as final recommendations. Please call my     office if you have not received a letter after 3 weeks.          ______________________________     Rachael Fee, MD          n.  eSIGNED:   Rachael Fee at 06/12/2010 02:00 PM          Theola Sequin, [id number]  Note: An exclamation mark (!) indicates a result that was not dispersed into the flowsheet. Document Creation Date: 06/12/2010 2:01 PM _______________________________________________________________________  (1) Order result status: Final Collection or observation date-time: 06/12/2010 13:56 Requested date-time:  Receipt date-time:  Reported date-time:  Referring Physician:   Ordering Physician: Rob Bunting [id number]) Specimen Source:  Source: Launa Grill Order Number: [id number] Lab site:   Appended Document: Colonoscopy     Procedures Next Due Date:    Colonoscopy: 06/2015

## 2010-06-19 NOTE — Progress Notes (Signed)
Summary: Procedure questions  Phone Note Call from Patient Call back at Home Phone [phone]   Caller: Patient Call For: Dr. Christella Hartigan Reason for Call: Talk to Nurse Summary of Call: Patient has questions about her procedure tomorrow Initial call taken by: [place] Johnson,  June 11, 2010 10:46 AM  Follow-up for Phone Call        Answered patients questions about lotion, clear liq. diet and prep. No further questions at this time. Follow-up by: Sherren Kerns RN,  June 11, 2010 11:08 AM

## 2010-06-22 ENCOUNTER — Ambulatory Visit: Payer: PRIVATE HEALTH INSURANCE | Admitting: Physical Medicine & Rehabilitation

## 2010-06-22 DIAGNOSIS — G5690 Unspecified mononeuropathy of unspecified upper limb: Secondary | ICD-10-CM | POA: Insufficient documentation

## 2010-06-22 DIAGNOSIS — Z79899 Other long term (current) drug therapy: Secondary | ICD-10-CM | POA: Insufficient documentation

## 2010-06-22 DIAGNOSIS — E119 Type 2 diabetes mellitus without complications: Secondary | ICD-10-CM | POA: Insufficient documentation

## 2010-06-22 DIAGNOSIS — I10 Essential (primary) hypertension: Secondary | ICD-10-CM | POA: Insufficient documentation

## 2010-06-22 DIAGNOSIS — G5790 Unspecified mononeuropathy of unspecified lower limb: Secondary | ICD-10-CM | POA: Insufficient documentation

## 2010-06-25 ENCOUNTER — Encounter (INDEPENDENT_AMBULATORY_CARE_PROVIDER_SITE_OTHER): Payer: Self-pay | Admitting: *Deleted

## 2010-06-26 NOTE — Letter (Signed)
Summary: Results Letter  [facility]  [address] [place], [place] [address]   Phone: [phone]  Fax: [phone]        June 19, 2010 MRN: [id number]    Brittany [name] [address] [place], [place]  [address]    Dear Brittany [name],   The polyp removed during your recent colonoscopy was proven to be adenomatous.  These are pre-cancerous polyps that may have grown into cancers if they had not been removed.  Based on current nationally recognized surveillance guidelines, I recommend that you have a repeat colonoscopy in 5 years.  The biopsies taken during the EGD showed no sign of infection or tumor.  You should continue to follow the recommendations that we discussed at the time of your procedure.  We will therefore put your information in our reminder system and will contact you in 5 years to schedule a repeat colonoscopy.  Please call if you have any questions or concerns.       Sincerely,  Rachael Fee MD  This letter has been electronically signed by your physician.  Appended Document: Results Letter letter mailed

## 2010-06-27 ENCOUNTER — Ambulatory Visit (HOSPITAL_BASED_OUTPATIENT_CLINIC_OR_DEPARTMENT_OTHER): Payer: PRIVATE HEALTH INSURANCE | Admitting: Neurosurgery

## 2010-06-27 ENCOUNTER — Encounter: Payer: PRIVATE HEALTH INSURANCE | Attending: Neurosurgery

## 2010-06-29 NOTE — Consult Note (Signed)
REASON FOR VISIT:  Neuropathic pain.  A 65 year old female who had onset of burning discomfort in the hand and feet in 2003.  She was evaluated by Neurology, both at [facility] as well as [facility].  She had multiple studies including sed rate, TSH, ANA, nerve conduction studies and MRI of the spine.  Her past medical history is also significant for L4-5 fusion in 1998.  Repeat MRI of the lumbar spine in 2006 showed no evidence of compression.  She has had increasing numbness, tingling pain in the hands and feet.  She has been diagnosed with diabetes.  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  She was initially trialed on tramadol, failing this was treated with methadone, fentanyl and Kadian before MS Contin was utilized.  Her last MS Contin dose was 60 t.i.d.  Apparently her insurance was no longer taken by her regular pain physician and therefore is transferred to this clinic.  She has been on a reduced dose of morphine, in fact 15 mg that she takes 3-4 times per day over the last several weeks.  Her Neurontin dose has remained stable at 800 q.i.d.  Other neuropathic pain medicines that were trialed include Trileptal, Neurontin, doxepin, Cymbalta.  Medications; amlodipine, bisoprolol, Neurontin, morphine sulfate currently 15 mg, Protonix, estradiol, tizanidine, simvastatin, metformin, Vivelle-Dot, doxepin 100 mg, loratadine, aspirin, fish oil, multivitamin.  Her other past medical history is significant for diabetes, hypertension.  PAST SURGICAL HISTORY:  Carpal tunnel release, bunionectomy, hysterectomy, laminectomy, and neuroma excision.  Her pain is graded at 6/10 on average, described as sharp, burning, stabbing, constant tingling, aching.  Her back pain is minor component of her overall pain.  It is mainly in hands and feet.  She wears gloves and socks most of the time.  Her pain interferes with activity at 7/10 level.  Sleep  is fair.  Pain is worse with walking, sitting.  Relieves with rest and medication.  Relief from meds is fair.  She walks 20 minutes at a time.  She climbs steps.  She can drive.  Last employed 2002, disability date 2004.  Review of systems positive for numbness, tingling, depression, blood sugar regulation problems.  PAST HISTORY:  Diabetes, hypertension as noted above.  SOCIAL HISTORY:  Married, lives with her husband.  Nonsmoker, nondrinker.  FAMILY HISTORY:  Heart disease, diabetes, hypertension.  Examination; blood pressure 119/67, pulse 73, respirations 18, O2 sat 98% on room air.  General, no acute distress.  Mood and affect appropriate.  Her back has no tenderness to palpation.  She has no tenderness over the neck area.  Neck range of motion is good.  Lumbar spine range of motion is 75% forward flexion, 50% extension.  Upper extremity strength is 5/5 in the deltoid, biceps, triceps and 4/5 in the grip.  In lower extremities she has 5/5 throughout.  She does not have good sense where her feet are at during manual muscle testing.  Vibratory sensation reduced in bilateral upper and lower extremity.  Her proprioceptive sense is intact at the wrist level in the upper extremities and only at the knee level.  In lower extremities her sensory level is at the knees, bilaterally in the lowers and to the elbows, bilaterally in the upper to pinprick.  She has no evidence of ataxia.  Her gait is wide-based.  No evidence of toe drag or knee instability.  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  RECOMMENDATIONS: 1.  She is at a fall risk.  I think once her pain is under better     control, we will get her through some physical therapy. 2. Assuming that urine drug screen looks okay, I will be able to     resume her morphine prescription and we will go back to 60 mg     t.i.d. 3. Continue the doxepin 100 mg a day as well as the  Neurontin 800     q.i.d.  Discussed with the patient, agrees with plan.  The policies     of the clinic were reviewed with the patient as well as between     nurse and the patient signed controlled substance agreement.     Erick Colace, M.D.    AEK/MedQ D:05/21/2010 12:30:48  T:05/21/2010 20:27:15  Job #:  [id number]  cc:   Lelon Perla, DO [address] [place], [place] [address]  Kathrin Penner. Vear Clock, M.D. Fax: [phone]  Electronically Signed by Claudette Laws M.D. on 06/22/2010 09:44:07 AM

## 2010-06-29 NOTE — Assessment & Plan Note (Signed)
[facility]                                ON-CALL NOTE  JEFFIFER, RABOLD                         MRN:          [id number] DATE:06/12/2010                            DOB:          October 23, 1945   The patient calls stating that she is having some loose stools following colonoscopy.  She is without pain.  She had a full meal when she was advised to have a light meal.  I instructed her that there will be no consequences.    Barbette Hair. Arlyce Dice, MD,FACG    RDK/MedQ  DD: 06/12/2010  DT: 06/13/2010  Job #: [id number]

## 2010-07-05 NOTE — Letter (Signed)
Summary: Primary Care Appointment Letter  [facility] [place]  [address] [place], [place] [address]   Phone: [phone]  Fax: [phone]    06/25/2010 MRN: [id number]     Myrel Dapolito [address] [place], [place]  [address]  [place]     Dear Ms. Knezevic,   Your Primary Care Physician Loreen Freud DO has indicated that:    ___X____it is time to schedule an appointment for fasting labs 250.00, 272.4  lipid, hep, bmp, hgba1c.    _______you missed your appointment on______ and need to call and          reschedule.    _______you need to have lab work done.    _______you need to schedule an appointment discuss lab or test results.    _______you need to call to reschedule your appointment that is                       scheduled on _________.     Please call our office as soon as possible. Our phone number is [phone]. Please press option 1. Our office is open 8a-5p, Monday through Friday.     Thank you, [facility] Scheduler

## 2010-07-23 ENCOUNTER — Ambulatory Visit: Payer: PRIVATE HEALTH INSURANCE | Admitting: Physical Medicine & Rehabilitation

## 2010-07-26 ENCOUNTER — Ambulatory Visit (HOSPITAL_BASED_OUTPATIENT_CLINIC_OR_DEPARTMENT_OTHER): Payer: PRIVATE HEALTH INSURANCE | Admitting: Physical Medicine & Rehabilitation

## 2010-07-26 ENCOUNTER — Encounter: Payer: PRIVATE HEALTH INSURANCE | Attending: Neurosurgery

## 2010-07-26 DIAGNOSIS — I10 Essential (primary) hypertension: Secondary | ICD-10-CM | POA: Insufficient documentation

## 2010-07-26 DIAGNOSIS — G5690 Unspecified mononeuropathy of unspecified upper limb: Secondary | ICD-10-CM | POA: Insufficient documentation

## 2010-07-26 DIAGNOSIS — M25549 Pain in joints of unspecified hand: Secondary | ICD-10-CM

## 2010-07-26 DIAGNOSIS — IMO0002 Reserved for concepts with insufficient information to code with codable children: Secondary | ICD-10-CM

## 2010-07-26 DIAGNOSIS — E119 Type 2 diabetes mellitus without complications: Secondary | ICD-10-CM | POA: Insufficient documentation

## 2010-07-26 DIAGNOSIS — G5790 Unspecified mononeuropathy of unspecified lower limb: Secondary | ICD-10-CM | POA: Insufficient documentation

## 2010-07-26 DIAGNOSIS — M25579 Pain in unspecified ankle and joints of unspecified foot: Secondary | ICD-10-CM

## 2010-07-26 DIAGNOSIS — Z79899 Other long term (current) drug therapy: Secondary | ICD-10-CM | POA: Insufficient documentation

## 2010-07-27 NOTE — Assessment & Plan Note (Signed)
ACCOUNT NUMBER:  [id number].  This is a patient who was seen last by me in March.  She has had a previous initial visit with Dr. Wynn Banker for neuropathic pain.  She had been followed at [facility] and other Pain Clinics, and was switched to Dr. Wynn Banker for evaluation, and after clean UDS, was up placed on MS Contin 60 mg one p.o. t.i.d.  She is also on Neurontin 800 mg 4 times a day.  PAST MEDICAL HISTORY:  Unchanged.  SOCIAL HISTORY:  Unchanged.  FAMILY HISTORY:  Unchanged.  PHYSICAL EXAM:  Today shows the blood pressure to be 129/63, pulse 67, respirations 18, O2 sats 98% on room air.  Her motor strength appears to be 5/5 in the upper extremities.  She has decreased sensation in her hands and both feet, especially below the knees.  Range of motion is fairly good.  She does seem somewhat depressed as her husband has been placed on hospice care.  She is alert and oriented x3. Constitutionally, she is in with normal limits.  ASSESSMENT:  Neuropathic pain, upper and lower extremities, worse in the hands and feet, paresthesias, sensory deficits.  PLAN:  I went ahead and refilled her MS Contin today 60 mg one p.o. t.i.d., #90, with no refill.  She has got refills on her Neurontin.  She will follow up here in the clinic in 1 month.  Her questions were encouraged and answered.  If she has any problems in the interim, she will give [place] a call.  Note, her Oswestry score today is 58.  No signs of aberrant behavior.     Brittany [name] L. Blima Dessert    RLW/MedQ D:  07/26/2010 14:01:14  T:  07/27/2010 02:50:48  Job #:  [id number]

## 2010-08-01 ENCOUNTER — Other Ambulatory Visit: Payer: Self-pay | Admitting: Family Medicine

## 2010-08-02 ENCOUNTER — Other Ambulatory Visit: Payer: Self-pay | Admitting: Family Medicine

## 2010-08-03 ENCOUNTER — Telehealth: Payer: Self-pay | Admitting: Family Medicine

## 2010-08-03 MED ORDER — METFORMIN HCL ER 500 MG PO TB24: 500.0000 mg | ORAL_TABLET | Freq: Every day | ORAL | Status: DC

## 2010-08-03 NOTE — Telephone Encounter (Signed)
Patient called to report that she threw away her Metformin bottle so she is OUT of meds---can we call in another prescription for her to the [facility] on [address]---she gets a 30 day supply

## 2010-08-21 ENCOUNTER — Encounter: Payer: PRIVATE HEALTH INSURANCE | Attending: Neurosurgery | Admitting: Neurosurgery

## 2010-08-21 DIAGNOSIS — G894 Chronic pain syndrome: Secondary | ICD-10-CM

## 2010-08-21 DIAGNOSIS — R209 Unspecified disturbances of skin sensation: Secondary | ICD-10-CM | POA: Insufficient documentation

## 2010-08-21 DIAGNOSIS — M79609 Pain in unspecified limb: Secondary | ICD-10-CM | POA: Insufficient documentation

## 2010-08-21 DIAGNOSIS — IMO0002 Reserved for concepts with insufficient information to code with codable children: Secondary | ICD-10-CM

## 2010-08-22 NOTE — Assessment & Plan Note (Signed)
This is a patient seen and followed here for neuropathic pain.  She states that her husband passed away about 2 weeks ago and other than that, she has had no changes in her pain level or any problems.  PAST MEDICAL HISTORY:  Unchanged.  SOCIAL HISTORY:  Now, she is widowed.  She lives alone.  FAMILY HISTORY:  Unchanged.  Shows her pain level to be 5/5, is sharp burning, stabbing, it is in a glove and stocking type formation bilaterally.  It is worse in the morning and evening.  Her sleep patterns are poor.  With most of all activities aggravate.  Medication helps somewhat.  She walks without assistance.  She can walk about 15 minutes.  She climb steps and drives. She is on disability.  REVIEW OF SYSTEMS:  Notable for those difficulties as well as bladder control, numbness, weakness, and some depression.  PHYSICAL EXAMINATION:  VITAL SIGNS:  Blood pressure is 120/71, pulse 71, respirations 20, O2 sats 98% on room air. MUSCULOSKELETAL:  She is 5/5 in upper and lower extremities, positive sensation, although she does complain of some diminished sensation and again the glove and stocking type format, otherwise is doing well with her medicines.  ASSESSMENT:  Neuropathic pain in the upper and lower extremities with paresthesias.  PLAN:  We are going to go ahead and refill her MS Contin 60 mg one p.o. t.i.d. 90 with no refills.  Her questions were encouraged and answered. We will see her back in 1 month.     Finlay Godbee L. Blima Dessert Electronically Signed    RLW/MedQ D:  08/21/2010 14:21:41  T:  08/22/2010 02:45:44  Job #:  [id number]

## 2010-08-23 ENCOUNTER — Other Ambulatory Visit: Payer: Self-pay | Admitting: Family Medicine

## 2010-09-03 ENCOUNTER — Other Ambulatory Visit: Payer: Self-pay | Admitting: Family Medicine

## 2010-09-18 ENCOUNTER — Ambulatory Visit (INDEPENDENT_AMBULATORY_CARE_PROVIDER_SITE_OTHER): Payer: PRIVATE HEALTH INSURANCE | Admitting: Family Medicine

## 2010-09-18 ENCOUNTER — Encounter: Payer: PRIVATE HEALTH INSURANCE | Attending: Neurosurgery | Admitting: Neurosurgery

## 2010-09-18 ENCOUNTER — Ambulatory Visit: Payer: PRIVATE HEALTH INSURANCE

## 2010-09-18 ENCOUNTER — Encounter: Payer: Self-pay | Admitting: Family Medicine

## 2010-09-18 DIAGNOSIS — Z79899 Other long term (current) drug therapy: Secondary | ICD-10-CM

## 2010-09-18 DIAGNOSIS — IMO0002 Reserved for concepts with insufficient information to code with codable children: Secondary | ICD-10-CM

## 2010-09-18 DIAGNOSIS — R209 Unspecified disturbances of skin sensation: Secondary | ICD-10-CM | POA: Insufficient documentation

## 2010-09-18 DIAGNOSIS — E785 Hyperlipidemia, unspecified: Secondary | ICD-10-CM

## 2010-09-18 DIAGNOSIS — G608 Other hereditary and idiopathic neuropathies: Secondary | ICD-10-CM | POA: Insufficient documentation

## 2010-09-18 DIAGNOSIS — M79609 Pain in unspecified limb: Secondary | ICD-10-CM | POA: Insufficient documentation

## 2010-09-18 DIAGNOSIS — F430 Acute stress reaction: Secondary | ICD-10-CM

## 2010-09-18 DIAGNOSIS — L659 Nonscarring hair loss, unspecified: Secondary | ICD-10-CM | POA: Insufficient documentation

## 2010-09-18 DIAGNOSIS — R5383 Other fatigue: Secondary | ICD-10-CM

## 2010-09-18 DIAGNOSIS — R5381 Other malaise: Secondary | ICD-10-CM

## 2010-09-18 LAB — HEPATIC FUNCTION PANEL
ALT: 17 U/L (ref 0–35)
Alkaline Phosphatase: 49 U/L (ref 39–117)
Bilirubin, Direct: 0.1 mg/dL (ref 0.0–0.3)
Total Bilirubin: 0.7 mg/dL (ref 0.3–1.2)
Total Protein: 7.2 g/dL (ref 6.0–8.3)

## 2010-09-18 LAB — VITAMIN B12: Vitamin B-12: 697 pg/mL (ref 211–911)

## 2010-09-18 LAB — CBC WITH DIFFERENTIAL/PLATELET
Basophils Relative: 0.4 % (ref 0.0–3.0)
Eosinophils Absolute: 0.1 10*3/uL (ref 0.0–0.7)
Eosinophils Relative: 1 % (ref 0.0–5.0)
Lymphocytes Relative: 20.5 % (ref 12.0–46.0)
Monocytes Absolute: 0.6 10*3/uL (ref 0.1–1.0)
Neutrophils Relative %: 72 % (ref 43.0–77.0)
Platelets: 201 10*3/uL (ref 150.0–400.0)
RBC: 5 Mil/uL (ref 3.87–5.11)
WBC: 9.8 10*3/uL (ref 4.5–10.5)

## 2010-09-18 LAB — TSH: TSH: 0.73 u[IU]/mL (ref 0.35–5.50)

## 2010-09-18 LAB — BASIC METABOLIC PANEL
BUN: 14 mg/dL (ref 6–23)
Calcium: 9.3 mg/dL (ref 8.4–10.5)
Creatinine, Ser: 0.7 mg/dL (ref 0.4–1.2)
GFR: 91.48 mL/min (ref >60.00)

## 2010-09-18 LAB — T3, FREE: T3, Free: 2.9 pg/mL (ref 2.3–4.2)

## 2010-09-18 LAB — POCT URINALYSIS DIPSTICK
Bilirubin, UA: NEGATIVE
Glucose, UA: NEGATIVE
Ketones, UA: NEGATIVE
Leukocytes, UA: NEGATIVE
Protein, UA: NEGATIVE

## 2010-09-18 LAB — T4, FREE: Free T4: 1.11 ng/dL (ref 0.60–1.60)

## 2010-09-18 LAB — LIPID PANEL: VLDL: 10.8 mg/dL (ref 0.0–40.0)

## 2010-09-18 MED ORDER — DOXEPIN HCL 100 MG PO CAPS: 100.0000 mg | ORAL_CAPSULE | Freq: Every day | ORAL | Status: DC

## 2010-09-18 NOTE — Assessment & Plan Note (Signed)
Eat balanced diet ---with enough protein Take vitamins Check labs

## 2010-09-18 NOTE — Assessment & Plan Note (Signed)
Check labs 

## 2010-09-18 NOTE — Progress Notes (Signed)
  Subjective:    Patient ID: Brittany [name], female    DOB: 04/10/1945, 65 y.o.   MRN: [id number]  HPI Pt here c/o hair loss and inc stress secondary death of her husband.   Pt has not been eating well and has lost weight.     Review of Systems As above    Objective:   Physical Exam  Constitutional: She appears well-developed and well-nourished.  HENT:       Thinning hair  Psychiatric: She has a normal mood and affect. Her behavior is normal. Judgment and thought content normal.          Assessment & Plan:

## 2010-09-18 NOTE — Patient Instructions (Signed)
Adjustment Disorder Most changes in life can cause stress. Getting used to changes may take a few months or longer. If feelings of stress, hopelessness, or worry continue, you may have an adjustment disorder. This stress-related mental health problem may affect your feelings, thinking and how you act. It occurs in both sexes and happens at any age. SYMPTOMS  Some of the following problems may be seen and vary from person to person:  Sadness or depression.   Loss of enjoyment.   Thoughts of suicide.   Fighting.   Avoiding family and friends.   Poor school performance.   Hopelessness, sense of loss.   Trouble sleeping.   Vandalism.   Worry, weight loss or gain.  Crying spells.   Anxiety   Reckless driving.   Skipping school.   Poor work International aid/development worker.   Nervousness.   Ignoring bills.   Poor attitude.   DIAGNOSIS  Your caregiver will ask what has happened in your life and do a physical exam. They will make a diagnosis of an adjustment disorder when they are sure another problem or medical illness causing your feelings does not exist. TREATMENT When problems caused by stress interfere with you daily life or last longer than a few months, you may need counseling for an adjustment disorder. Early treatment may diminish problems and help you to better cope with the stressful events in your life. Sometimes medication is necessary. Individual counseling and or support groups can be very helpful. PROGNOSIS  Adjustment disorders usually last less than 3 to 6 months. The condition may persist if there is long lasting stress. This could include health problems, relationship problems, or job difficulties where you can not easily escape from what is causing the problem. PREVENTION Even the most mentally healthy, highly functioning people can suffer from an adjustment disorder given a significant blow from a life-changing event. There is no way to prevent pain and loss. Most people need  help from time to time. You are not alone. SEEK MEDICAL CARE IF: Your feelings or symptoms listed above do not improve or worsen. Document Released: 11/27/2005 Document Re-Released: 06/19/2009 [facility] Patient Information 2011 [place], [place].

## 2010-09-19 ENCOUNTER — Other Ambulatory Visit: Payer: Self-pay | Admitting: Family Medicine

## 2010-09-19 NOTE — Assessment & Plan Note (Signed)
ACCOUNT:  [id number].  Brittany [name] is seen here for followup of her neuropathic pain in her upper and lower extremities.  She has had some stressors in her life with her husband's passing and she did not bring her bottles today.  She was warned that if she does not bring her bottles for counts in the future, she might be dispelled from the unit or from the clinic and she understands that.  Otherwise, she rates her pain at about 5, it is sharp, constant, agonizing-type pain.  General activity level is 6-7. Pain is worse in morning and not all activities aggravate her pain. Rest and medication tend to help some.  She walks without assistance. She walk about 20 minutes.  She climb steps and drive.  She is on disability.  REVIEW OF SYSTEMS:  Notable for those difficulties as well as dizziness and depression, bladder control problems, otherwise within normal limits.  PAST MEDICAL HISTORY:  Unchanged.  SOCIAL HISTORY:  She is now widowed, lives alone.  FAMILY HISTORY:  Unchanged.  PHYSICAL EXAMINATION:  VITAL SIGNS:  Blood pressure is 138/91, pulse 70, respirations 18, O2 sats 99 on room air. NEUROLOGIC:  Motor strength is intact.  Sensation is intact but diminished in the distal upper and lower extremities. CONSTITUTIONAL:  She is within normal limits.  She is alert and oriented x3.  There is no signs of aberrant behaviors, but we will enforce the pill count.  ASSESSMENT:  Neuropathic pain, upper and lower extremities with paresthesias.  PLAN:  We went ahead and refilled her MS Contin 60 mg one p.o. t.i.d., 90 with no refills.  Her questions were encouraged and answered.  We will see her back in 1 month.     Brittany [name] L. Blima Dessert Electronically Signed    RLW/MedQ D:  09/18/2010 15:54:03  T:  09/19/2010 06:59:34  Job #:  [id number]

## 2010-09-26 ENCOUNTER — Telehealth: Payer: Self-pay | Admitting: *Deleted

## 2010-09-26 DIAGNOSIS — L659 Nonscarring hair loss, unspecified: Secondary | ICD-10-CM

## 2010-09-27 NOTE — Telephone Encounter (Signed)
mssg left for a return call    KP  

## 2010-09-27 NOTE — Telephone Encounter (Signed)
Refer to Derm---- there is a derm at [facility] who specializes in hair loss---would she like to go there?

## 2010-09-28 NOTE — Telephone Encounter (Signed)
Left message to call office

## 2010-10-01 NOTE — Telephone Encounter (Signed)
Discuss with patient but would like to try a local Derm first rather than at [facility]. Pt aware referral in awaiting appt info.

## 2010-10-02 ENCOUNTER — Encounter: Payer: Self-pay | Admitting: Family Medicine

## 2010-10-15 ENCOUNTER — Ambulatory Visit (HOSPITAL_BASED_OUTPATIENT_CLINIC_OR_DEPARTMENT_OTHER): Payer: PRIVATE HEALTH INSURANCE | Admitting: Physical Medicine & Rehabilitation

## 2010-10-15 ENCOUNTER — Encounter: Payer: PRIVATE HEALTH INSURANCE | Attending: Neurosurgery

## 2010-10-15 DIAGNOSIS — L02619 Cutaneous abscess of unspecified foot: Secondary | ICD-10-CM

## 2010-10-15 DIAGNOSIS — E119 Type 2 diabetes mellitus without complications: Secondary | ICD-10-CM | POA: Insufficient documentation

## 2010-10-15 DIAGNOSIS — I10 Essential (primary) hypertension: Secondary | ICD-10-CM | POA: Insufficient documentation

## 2010-10-15 DIAGNOSIS — E1142 Type 2 diabetes mellitus with diabetic polyneuropathy: Secondary | ICD-10-CM

## 2010-10-15 DIAGNOSIS — L03039 Cellulitis of unspecified toe: Secondary | ICD-10-CM

## 2010-10-15 DIAGNOSIS — G5690 Unspecified mononeuropathy of unspecified upper limb: Secondary | ICD-10-CM | POA: Insufficient documentation

## 2010-10-15 DIAGNOSIS — Z79899 Other long term (current) drug therapy: Secondary | ICD-10-CM | POA: Insufficient documentation

## 2010-10-15 DIAGNOSIS — G5790 Unspecified mononeuropathy of unspecified lower limb: Secondary | ICD-10-CM | POA: Insufficient documentation

## 2010-10-16 NOTE — Assessment & Plan Note (Signed)
REASON FOR VISIT:  Pain in feet and hands.  HISTORY:  A 65 year old female with history of diabetes with neuropathy. She has poor sensation, bilateral hands and feet.  In addition, she has poor balance, although her balance feels like it is better since I saw her in February.  She states that she is sleeping better because she is no longer having to care for her terminally ill husband.  Her husband passed away about 3 months ago.  Her pain level is 4-5/10 on medications.  Pain interferes with activity at a moderate level, described as sharp burning, dull, stabbing, tingling, and aching; worse with walking, sitting, standing, improves with rest and with medications.  She can walk 30 minutes at a time.  She climbs steps.  She drives.  INTERVAL MEDICAL HISTORY:  She skinned her great toe, but does not remember doing it.  She noticed there was some bleeding in the shower. She started putting a Band-Aid on it and is using a cast shoe.  She has no sweats or chills.  Other review of systems, she denies any weakness in the feet or hands.  SOCIAL HISTORY:  Widowed, recently as noted above.  No drug, alcohol, or tobacco use.  PHYSICAL EXAMINATION:  VITAL SIGNS:  Blood pressure is 126/88, pulse 122, respirations 20, O2 sat 87% on room air. GENERAL:  A well-developed, well-nourished female in no acute distress. MUSCULOSKELETAL:  She does have intrinsic atrophy, bilateral feet.  She has decreased sensation to the elbows, to pinprick and light touch, and to the knees, to pinprick and light touch.  Her proprioception is intact to the elbows, in the uppers and to the knees in the lowers.  Her pulses are normal in the upper and lower extremity.  Her left great toe plantar surface has loss of skin layer, she had blister that had burst and is now open.  There is some surrounding erythema on the dorsal aspect of the toe.  She has no tenderness to palpation.  No drainage.  IMPRESSION: 1. 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 2. Insensate foot with mild cellulitis of the toe, I started on some     Keflex 500 t.i.d. x1 week and told her to follow up with her     podiatrist in 1-2 weeks. 3. Continue her morphine sulfate 60 mg of the controlled release     t.i.d. in combination with gabapentin 800 q.i.d.  She also takes     some tizanidine 4 mg t.i.d., this is more on a p.r.n. basis.  [facility] in 1 month.  I will see her back in 6 months.     Erick Colace, M.D. Electronically Signed    AEK/MedQ D:  10/15/2010 15:35:04  T:  10/16/2010 00:12:43  Job #:  [id number]  cc:   Lelon Perla, DO [address] [place], [place] [address]

## 2010-10-18 ENCOUNTER — Other Ambulatory Visit: Payer: Self-pay | Admitting: Family Medicine

## 2010-10-19 MED ORDER — OMEPRAZOLE 40 MG PO CPDR: 40.0000 mg | DELAYED_RELEASE_CAPSULE | Freq: Every day | ORAL | Status: DC

## 2010-10-19 MED ORDER — BISOPROLOL-HYDROCHLOROTHIAZIDE 10-6.25 MG PO TABS: 1.0000 | ORAL_TABLET | Freq: Every day | ORAL | Status: DC

## 2010-10-19 MED ORDER — SIMVASTATIN 40 MG PO TABS: 40.0000 mg | ORAL_TABLET | Freq: Every day | ORAL | Status: DC

## 2010-10-19 MED ORDER — ESTRADIOL 1 MG PO TABS: 1.0000 mg | ORAL_TABLET | Freq: Every day | ORAL | Status: DC

## 2010-10-19 NOTE — Telephone Encounter (Signed)
Labs current----Rx faxed

## 2010-10-23 NOTE — Telephone Encounter (Signed)
Patient needs these 4 prescriptions to be for 90 days instead of 30   Please call in correction  thanks

## 2010-11-12 ENCOUNTER — Encounter: Payer: PRIVATE HEALTH INSURANCE | Admitting: Neurosurgery

## 2010-11-12 DIAGNOSIS — E1142 Type 2 diabetes mellitus with diabetic polyneuropathy: Secondary | ICD-10-CM | POA: Insufficient documentation

## 2010-11-12 DIAGNOSIS — E1149 Type 2 diabetes mellitus with other diabetic neurological complication: Secondary | ICD-10-CM | POA: Insufficient documentation

## 2010-11-19 ENCOUNTER — Encounter: Payer: PRIVATE HEALTH INSURANCE | Attending: Neurosurgery | Admitting: Neurosurgery

## 2010-11-19 DIAGNOSIS — R209 Unspecified disturbances of skin sensation: Secondary | ICD-10-CM | POA: Insufficient documentation

## 2010-11-19 DIAGNOSIS — M79609 Pain in unspecified limb: Secondary | ICD-10-CM | POA: Insufficient documentation

## 2010-11-19 DIAGNOSIS — G609 Hereditary and idiopathic neuropathy, unspecified: Secondary | ICD-10-CM

## 2010-11-20 NOTE — Assessment & Plan Note (Signed)
This is a patient of Dr. Pamelia Hoit, seen for diabetic neuropathy and her stocking-glove type formation.  She states that her pain is unchanged, it is a sharp, burning, constant pain with paresthesias.  She rates her pain at 5.  General activity level is 7 or 9.  She does not indicate her sleep pattern aggravates or improves her pain.  Mobility, she walks without assistance.  She climb steps, drive.  She can walk up to 30 minutes at a time.  She is on disability.  REVIEW OF SYSTEMS:  Notable for bladder control problems and weakness and numbness, tingling, some depression.  No suicidal thoughts or aberrant behaviors.  Night sweats, high and low blood sugar fluctuations, otherwise within normal limits.  PAST MEDICAL HISTORY:  Unchanged.  SOCIAL HISTORY:  She is widowed.  She lives by herself.  FAMILY HISTORY:  Unchanged.  PHYSICAL EXAMINATION:  VITAL SIGNS:  Her blood pressure is 106/25, pulse 53, respirations 16, O2 sats 98 on room air. MUSCULOSKELETAL:  Her motor strength is 5/5 in the upper and lower extremities.  Sensation is intact although somewhat diminished. GENERAL:  Constitutionally, she is within normal limits.  She is alert and oriented x3.  ASSESSMENT:  Diabetic polyneuropathy.  PLAN: 1. Continue MS Contin 60 mg 1 p.o. t.i.d., 90 with no refill. 2. Gabapentin 800 mg 1 p.o. q.i.d. 360.  She want 3 months' supply     with 3 refills.  We will see her back here in the clinic in 1     month.  Her questions were encouraged and answered.     Christopher Glasscock L. Blima Dessert Electronically Signed    RLW/MedQ D:  11/19/2010 15:57:59  T:  11/20/2010 01:14:19  Job #:  [id number]

## 2010-11-21 ENCOUNTER — Other Ambulatory Visit: Payer: Self-pay | Admitting: Family Medicine

## 2010-11-21 MED ORDER — SIMVASTATIN 40 MG PO TABS: 40.0000 mg | ORAL_TABLET | Freq: Every day | ORAL | Status: DC

## 2010-11-21 MED ORDER — BISOPROLOL-HYDROCHLOROTHIAZIDE 10-6.25 MG PO TABS: 1.0000 | ORAL_TABLET | Freq: Every day | ORAL | Status: DC

## 2010-11-21 MED ORDER — OMEPRAZOLE 40 MG PO CPDR: 40.0000 mg | DELAYED_RELEASE_CAPSULE | Freq: Every day | ORAL | Status: DC

## 2010-11-21 MED ORDER — ESTRADIOL 1 MG PO TABS: 1.0000 mg | ORAL_TABLET | Freq: Every day | ORAL | Status: DC

## 2010-11-21 NOTE — Telephone Encounter (Signed)
Rx sent in to pharmacy. 

## 2010-12-07 ENCOUNTER — Ambulatory Visit: Payer: PRIVATE HEALTH INSURANCE | Admitting: Family Medicine

## 2010-12-17 ENCOUNTER — Encounter: Payer: Self-pay | Admitting: Family Medicine

## 2010-12-17 ENCOUNTER — Ambulatory Visit (INDEPENDENT_AMBULATORY_CARE_PROVIDER_SITE_OTHER): Payer: PRIVATE HEALTH INSURANCE | Admitting: Family Medicine

## 2010-12-17 VITALS — BP 112/70 | HR 57 | Temp 99.2°F | Wt 175.6 lb

## 2010-12-17 DIAGNOSIS — F32A Depression, unspecified: Secondary | ICD-10-CM

## 2010-12-17 DIAGNOSIS — E119 Type 2 diabetes mellitus without complications: Secondary | ICD-10-CM

## 2010-12-17 DIAGNOSIS — E785 Hyperlipidemia, unspecified: Secondary | ICD-10-CM

## 2010-12-17 DIAGNOSIS — F329 Major depressive disorder, single episode, unspecified: Secondary | ICD-10-CM

## 2010-12-17 LAB — BASIC METABOLIC PANEL
BUN: 11 mg/dL (ref 6–23)
CO2: 32 mEq/L (ref 19–32)
Chloride: 104 mEq/L (ref 96–112)
Glucose, Bld: 108 mg/dL — ABNORMAL HIGH (ref 70–99)
Potassium: 4.4 mEq/L (ref 3.5–5.1)

## 2010-12-17 LAB — HEPATIC FUNCTION PANEL
ALT: 15 U/L (ref 0–35)
AST: 17 U/L (ref 0–37)
Total Bilirubin: 0.4 mg/dL (ref 0.3–1.2)
Total Protein: 6.7 g/dL (ref 6.0–8.3)

## 2010-12-17 LAB — LIPID PANEL
Cholesterol: 141 mg/dL (ref 0–200)
LDL Cholesterol: 51 mg/dL (ref 0–99)

## 2010-12-17 MED ORDER — ESCITALOPRAM OXALATE 10 MG PO TABS: 10.0000 mg | ORAL_TABLET | Freq: Every day | ORAL | Status: DC

## 2010-12-17 NOTE — Progress Notes (Signed)
  Subjective:   Brittany [name] is an 65 y.o. female who presents for evaluation and treatment of depressive symptoms.  Onset approximately 5 months ago, unchanged since that time.  Current symptoms include depressed mood, fatigue and difficulty concentrating.  Current treatment for depression:Individual therapy Sleep problems: Mild   Early awakening:Absent   Energy: Poor Motivation: Poor Concentration: Fair Rumination/worrying: Moderate Memory: Good Tearfulness: Mild  Anxiety: Absent  Panic: Absent  Overall Mood: Minimally worse  Hopelessness: Mild Suicidal ideation: Absent  Other/Psychosocial Stressors: husband passed away from long illness Family history positive for depression in the patient's n/a.  Previous treatment modalities employed include Individual therapy.  Past episodes of depression:yes --- one time several years ago Organic causes of depression present: None.  Review of Systems Pertinent items are noted in HPI.   Objective:   Mental Status Examination: Posture and motor behavior: Appropriate Dress, grooming, personal hygiene: Appropriate Facial expression: Appropriate Speech: Appropriate Mood: Appropriate Coherency and relevance of thought: Appropriate Thought content: Appropriate Perceptions: Appropriate Orientation:Appropriate Attention and concentration: Appropriate Memory: : Appropriate Information: Not examined Vocabulary: Appropriate Abstract reasoning: Appropriate Judgment: Appropriate    Assessment:   Experiencing the following symptoms of depression most of the day nearly every day for more than two consecutive weeks: depressed mood, loss of energy  Depressive Disorder: situational  Suicide Risk Assessment:  Suicidal intent: no Suicidal plan: no Access to means for suicide: no Lethality of means for suicide: no Prior suicide attempts: no Recent exposure to suicide:no   Plan:    1. Depression  escitalopram (LEXAPRO) 10 MG tablet     Reviewed concept of depression as biochemical imbalance of neurotransmitters and rationale for treatment. Instructed patient to contact office or on-call physician promptly should condition worsen or any new symptoms appear and provided on-call telephone numbers.

## 2010-12-17 NOTE — Patient Instructions (Signed)

## 2010-12-19 ENCOUNTER — Ambulatory Visit: Payer: PRIVATE HEALTH INSURANCE | Admitting: Neurosurgery

## 2010-12-20 ENCOUNTER — Encounter: Payer: PRIVATE HEALTH INSURANCE | Attending: Neurosurgery | Admitting: Neurosurgery

## 2010-12-20 DIAGNOSIS — E1142 Type 2 diabetes mellitus with diabetic polyneuropathy: Secondary | ICD-10-CM | POA: Insufficient documentation

## 2010-12-20 DIAGNOSIS — G609 Hereditary and idiopathic neuropathy, unspecified: Secondary | ICD-10-CM

## 2010-12-20 DIAGNOSIS — E1149 Type 2 diabetes mellitus with other diabetic neurological complication: Secondary | ICD-10-CM | POA: Insufficient documentation

## 2010-12-20 NOTE — Assessment & Plan Note (Signed)
This person of Dr. Pamelia Hoit, is seen for diabetic polyneuropathy.  She has a stocking-glove type formation.  She reports no change, and her pain at the 6, it is sharp, burning, stabbing, constant tingling, aching pain.  General activity level is 5/6.  Pain is worse during the day and evening.  Sleep patterns are fair.  Pain is worse walking, sitting, and activity.  Rest, medication tend to help.  She walks without assistance, she can walk about 20 minutes at a time.  She can climb steps and drives, transfers alone, is on disability.  REVIEW OF SYSTEMS:  Notable for difficulties as described above as well as some bladder control issues and depression and suicidal thoughts of aberrant behavior.  She has blood sugar fluctuations also.  Her Oswestry score is 56.  PAST MEDICAL HISTORY:  Unchanged.  SOCIAL HISTORY:  She is widowed.  She lives alone.  FAMILY HISTORY:  Unchanged.  PHYSICAL EXAMINATION:  VITAL SIGNS:  Her blood pressure is 144/114, encouraged to see her PCP about that, pulse 59, respirations 16, O2 sats 96 on room air. MUSCULOSKELETAL:  Sensation is diminished in upper and lower extremity. Strength is good. NEUROLOGIC:  Constitutionally, she is within normal limits.  She is alert and oriented x3.  She has got a normal gait.  ASSESSMENT:  Diabetic polyneuropathy.  PLAN: 1. Refill MS Contin 60 mg one p.o. t.i.d. #90 with no refill. 2. She will continue the rest of her medicines as prescribed.  We will     see her back here in 1 month.  Her questions were encouraged and     answered.     Victory Strollo L. Blima Dessert Electronically Signed    RLW/MedQ D:  12/20/2010 13:32:26  T:  12/20/2010 23:26:36  Job #:  [id number]

## 2011-01-16 ENCOUNTER — Ambulatory Visit: Payer: PRIVATE HEALTH INSURANCE | Admitting: Neurosurgery

## 2011-01-17 ENCOUNTER — Encounter: Payer: PRIVATE HEALTH INSURANCE | Attending: Neurosurgery | Admitting: Neurosurgery

## 2011-01-17 DIAGNOSIS — G894 Chronic pain syndrome: Secondary | ICD-10-CM

## 2011-01-17 DIAGNOSIS — E1149 Type 2 diabetes mellitus with other diabetic neurological complication: Secondary | ICD-10-CM | POA: Insufficient documentation

## 2011-01-17 DIAGNOSIS — E1142 Type 2 diabetes mellitus with diabetic polyneuropathy: Secondary | ICD-10-CM | POA: Insufficient documentation

## 2011-01-17 DIAGNOSIS — G609 Hereditary and idiopathic neuropathy, unspecified: Secondary | ICD-10-CM

## 2011-01-17 DIAGNOSIS — M79609 Pain in unspecified limb: Secondary | ICD-10-CM | POA: Insufficient documentation

## 2011-01-18 NOTE — Assessment & Plan Note (Signed)
This is a patient of Dr. Wynn Banker seen for diabetic polyneuropathy. She has neuropathy in a stocking-glove format and this has not changed. She rates her pain at a 4-5.  It is sharp, burning, tingling, aching, stabbing pain.  It is constant.  General activity level is 5.  Pain is worse during the day and night.  Sleep patterns are fair.  Pain is worse with walking, sitting, standing, and activity.  Some rest and medication help.  She walks without assistance.  She can walk about 25 minutes at a time.  She climbs steps and drives.  She transfers alone.  She is on disability.  REVIEW OF SYSTEMS:  Notable for difficulties described above as well as some weight loss, high blood sugars, bladder control issues, trouble with walking, depression.  No suicidal thoughts or aberrant behaviors. Her last UDS was consistent.  Pill counts are correct.  PAST MEDICAL HISTORY/SOCIAL HISTORY/FAMILY HISTORY:  Unchanged.  PHYSICAL EXAMINATION:  Blood pressure is 125/68, pulse 57, respirations 16, O2 sats 97 on room air.  Motor strength and sensation are intact, although diminished in the upper and lower extremities for sensation. Constitutionally she is within normal limits.  She is alert and oriented x3.  Her gait is normal.  ASSESSMENT:  Diabetic polyneuropathy.  PLAN:  Refill MS Contin 60 mg 1 p.o. t.i.d., #90 with no refill.  Her questions were encouraged and answered.  We will see her back in a month.     Meng Winterton L. Blima Dessert Electronically Signed    RLW/MedQ D:  01/17/2011 14:47:51  T:  01/17/2011 23:54:42  Job #:  [id number]

## 2011-01-23 ENCOUNTER — Telehealth: Payer: Self-pay | Admitting: Family Medicine

## 2011-01-23 MED ORDER — METFORMIN HCL ER 500 MG PO TB24: 500.0000 mg | ORAL_TABLET | Freq: Every day | ORAL | Status: DC

## 2011-01-23 NOTE — Telephone Encounter (Signed)
Rx sent 

## 2011-02-07 ENCOUNTER — Encounter: Payer: Self-pay | Admitting: Family Medicine

## 2011-02-12 ENCOUNTER — Other Ambulatory Visit: Payer: Self-pay | Admitting: Family Medicine

## 2011-02-13 ENCOUNTER — Other Ambulatory Visit: Payer: Self-pay | Admitting: Family Medicine

## 2011-02-14 ENCOUNTER — Encounter: Payer: PRIVATE HEALTH INSURANCE | Admitting: Neurosurgery

## 2011-02-18 ENCOUNTER — Encounter: Payer: PRIVATE HEALTH INSURANCE | Attending: Neurosurgery | Admitting: Neurosurgery

## 2011-02-18 DIAGNOSIS — E1149 Type 2 diabetes mellitus with other diabetic neurological complication: Secondary | ICD-10-CM | POA: Insufficient documentation

## 2011-02-18 DIAGNOSIS — E1142 Type 2 diabetes mellitus with diabetic polyneuropathy: Secondary | ICD-10-CM | POA: Insufficient documentation

## 2011-02-18 DIAGNOSIS — G894 Chronic pain syndrome: Secondary | ICD-10-CM

## 2011-02-18 DIAGNOSIS — F3289 Other specified depressive episodes: Secondary | ICD-10-CM | POA: Insufficient documentation

## 2011-02-18 DIAGNOSIS — F329 Major depressive disorder, single episode, unspecified: Secondary | ICD-10-CM | POA: Insufficient documentation

## 2011-02-18 DIAGNOSIS — R209 Unspecified disturbances of skin sensation: Secondary | ICD-10-CM | POA: Insufficient documentation

## 2011-02-18 DIAGNOSIS — G8929 Other chronic pain: Secondary | ICD-10-CM | POA: Insufficient documentation

## 2011-02-18 DIAGNOSIS — G609 Hereditary and idiopathic neuropathy, unspecified: Secondary | ICD-10-CM

## 2011-02-18 DIAGNOSIS — R279 Unspecified lack of coordination: Secondary | ICD-10-CM | POA: Insufficient documentation

## 2011-02-18 NOTE — Assessment & Plan Note (Signed)
This is a patient of Dr. Wynn Banker seen for diabetic polyneuropathy and a stocking-glove format.  She states her pain is unchanged.  She has felt that her arches maybe following in her feet and causing her problems.  She states she may want a referral to an orthopedist at some point and I have told her we could probably accommodate that.  Rates her average pain at a 4.  It is a sharp, burning, stabbing, tingling and aching pain.  General activity level is 1-6.  Pain is worse during the day and night.  Sleep patterns are fair.  Pain is worse with walking and activity and activity, rest and medication tend to help.  She walks without assistance.  She can walk about 25 minutes of time.  She climb steps and drive.  She is on disability.  REVIEW OF SYSTEMS:  Notable for difficulties described above as well as some bladder control issues, paresthesias, trouble walking, depression, no suicidal thoughts or aberrant behaviors.  Last UDS pill counts were consistent.  PAST MEDICAL HISTORY, SOCIAL HISTORY AND FAMILY HISTORY:  Unchanged.  PHYSICAL EXAMINATION:  VITAL SIGNS:  Her blood pressure is 136/61, pulse 63, respirations 16 and O2 sats 98 on room air.  EXTREMITIES:  Her sensation is diminished in the upper and lower extremities.  Strength is somewhat diminished. NEUROLOGIC:  Constitutionally, she is within normal limits.  She is alert and oriented x3.  She has a fairly normal gait.  ASSESSMENT:  Diabetic polyneuropathy and chronic pain.  PLAN:  Refill MS OxyContin 60 mg 1 p.o. t.i.d. 90 with no refill.  Her questions were encouraged and answered.  I will see her back here in a month.     Tranisha Tissue L. Blima Dessert Electronically Signed    RLW/MedQ D:  02/18/2011 14:39:58  T:  02/18/2011 21:22:39  Job #:  [id number]

## 2011-03-18 ENCOUNTER — Encounter: Payer: PRIVATE HEALTH INSURANCE | Admitting: Neurosurgery

## 2011-03-18 ENCOUNTER — Other Ambulatory Visit: Payer: Self-pay | Admitting: Family Medicine

## 2011-03-21 ENCOUNTER — Encounter: Payer: PRIVATE HEALTH INSURANCE | Attending: Neurosurgery | Admitting: Neurosurgery

## 2011-03-21 DIAGNOSIS — G894 Chronic pain syndrome: Secondary | ICD-10-CM

## 2011-03-21 DIAGNOSIS — E1149 Type 2 diabetes mellitus with other diabetic neurological complication: Secondary | ICD-10-CM | POA: Insufficient documentation

## 2011-03-21 DIAGNOSIS — G8929 Other chronic pain: Secondary | ICD-10-CM | POA: Insufficient documentation

## 2011-03-21 DIAGNOSIS — E1142 Type 2 diabetes mellitus with diabetic polyneuropathy: Secondary | ICD-10-CM | POA: Insufficient documentation

## 2011-03-21 DIAGNOSIS — G609 Hereditary and idiopathic neuropathy, unspecified: Secondary | ICD-10-CM

## 2011-03-22 NOTE — Assessment & Plan Note (Signed)
This is a patient Dr. Wynn Banker, seen for diabetic polyneuropathy that is in a stocking-glove format.  She reports no change, her pain is a 3- 4.  It is sharp, burning, stabbing, tingling, aching pain.  General activity level is 5 to an 8.  The pain is worse during the day and night.  Sleep patterns are fair.  Pain is worse with walking, sitting, standing, and activity.  Rest helps with medication.  She walks without assistance.  She climb steps and drive.  She can walk about 25 minutes at a time.  Functionally, she is unemployed.  REVIEW OF SYSTEMS:  Notable for the difficulties described above as well as some weight loss, bladder control issues, numbness, trouble walking, depression, no suicidal thoughts or aberrant behaviors.  Last pill count and UDS consistent.  Past medical history, social history, and family history are unchanged.  PHYSICAL EXAMINATION:  Her blood pressure is 113/61, pulse 61, respiration 14, O2 sats 96% on room air.  Her motor strength is intact in the upper and lower extremities.  Sensation is diminished in the upper and lower extremities in the stocking-glove format. Constitutionally, again, she is alert and oriented x3.  Her affect is normal.  She dreads Christmas because of frustrations without her husband.  ASSESSMENT:  Diabetic polyneuropathy and chronic pain.  PLAN:  Refill MS Contin 60 mg 1 p.o. t.i.d., 90 with no refill.  Her questions were encouraged and answered.  Dr. Wynn Banker will see her in a month.     Isabell Bonafede L. Blima Dessert Electronically Signed    RLW/MedQ D:  03/21/2011 14:25:27  T:  03/22/2011 02:25:50  Job #:  [id number]

## 2011-04-19 ENCOUNTER — Ambulatory Visit: Payer: PRIVATE HEALTH INSURANCE | Admitting: Physical Medicine & Rehabilitation

## 2011-04-22 ENCOUNTER — Telehealth: Payer: Self-pay | Admitting: Family Medicine

## 2011-04-22 MED ORDER — MORPHINE SULFATE CR 60 MG PO TB12: 60.0000 mg | ORAL_TABLET | Freq: Three times a day (TID) | ORAL | Status: DC | PRN

## 2011-04-22 NOTE — Telephone Encounter (Signed)
Patient states that she woke up this morning throwing up and diarrhea. I offered patient appt today and she declined. She states that she had an appt this morning with her Pain management doctor but she had to cancel. Her next appt with pain management is next week but she states that she is almost out of morphine. She would like to know if Dr. Laury Axon would write her a prescription for morpine to last her until her appt.

## 2011-04-22 NOTE — Telephone Encounter (Signed)
Patient aware Rx ready for pick up.      KP 

## 2011-04-22 NOTE — Telephone Encounter (Signed)
Discussed with patient and she did not go to her apt today with pain management, this would have been her first and she would like to know if you could fill her medications. I advised there is a chance that her medication will not be filled and she voiced understanding. Please advise    KP

## 2011-04-22 NOTE — Telephone Encounter (Signed)
Refill x1 only,  Since it was going to be her first appointment

## 2011-04-23 ENCOUNTER — Other Ambulatory Visit: Payer: Self-pay | Admitting: Family Medicine

## 2011-04-23 NOTE — Telephone Encounter (Signed)
Ok to refill for 6 months 

## 2011-04-23 NOTE — Telephone Encounter (Signed)
Last OV 12-17-10 last refill for lexapro 12-17-10 #30 with 2 refills

## 2011-04-24 NOTE — Telephone Encounter (Signed)
Noted in chart MD Lowne sent Medication via escribe

## 2011-06-05 ENCOUNTER — Telehealth: Payer: Self-pay

## 2011-06-05 NOTE — Telephone Encounter (Signed)
Fax from [facility] stating that patient is on Amlodipine and Simvastatin and patient need to change Rx to Lipitor 20 mg and patient is also due for labs this month also.    KP

## 2011-06-06 NOTE — Telephone Encounter (Signed)
msg left to call the office     KP 

## 2011-06-06 NOTE — Telephone Encounter (Signed)
Patient returned call from yesterday. Call back # [phone]

## 2011-06-10 NOTE — Telephone Encounter (Signed)
Discussed with patient and she voiced understanding--she will come in on Friday 06/14/11 for labs... Rx changed on med list    KP

## 2011-06-12 ENCOUNTER — Other Ambulatory Visit: Payer: PRIVATE HEALTH INSURANCE

## 2011-06-14 ENCOUNTER — Other Ambulatory Visit (HOSPITAL_COMMUNITY): Payer: Self-pay | Admitting: Anesthesiology

## 2011-06-14 ENCOUNTER — Ambulatory Visit (HOSPITAL_COMMUNITY)
Admission: RE | Admit: 2011-06-14 | Discharge: 2011-06-14 | Disposition: A | Discharge status: Home / Self Care | Payer: Medicare Other | Source: Ambulatory Visit | Attending: Anesthesiology | Admitting: Anesthesiology

## 2011-06-14 ENCOUNTER — Other Ambulatory Visit (INDEPENDENT_AMBULATORY_CARE_PROVIDER_SITE_OTHER): Payer: Medicare Other

## 2011-06-14 DIAGNOSIS — E785 Hyperlipidemia, unspecified: Secondary | ICD-10-CM

## 2011-06-14 DIAGNOSIS — M79673 Pain in unspecified foot: Secondary | ICD-10-CM

## 2011-06-14 DIAGNOSIS — M79609 Pain in unspecified limb: Secondary | ICD-10-CM | POA: Insufficient documentation

## 2011-06-14 DIAGNOSIS — M2140 Flat foot [pes planus] (acquired), unspecified foot: Secondary | ICD-10-CM | POA: Insufficient documentation

## 2011-06-14 DIAGNOSIS — E119 Type 2 diabetes mellitus without complications: Secondary | ICD-10-CM

## 2011-06-14 DIAGNOSIS — M19079 Primary osteoarthritis, unspecified ankle and foot: Secondary | ICD-10-CM | POA: Insufficient documentation

## 2011-06-14 LAB — LIPID PANEL: Total CHOL/HDL Ratio: 2

## 2011-06-14 LAB — BASIC METABOLIC PANEL
CO2: 32 mEq/L (ref 19–32)
Chloride: 100 mEq/L (ref 96–112)
Sodium: 140 mEq/L (ref 135–145)

## 2011-06-14 LAB — HEPATIC FUNCTION PANEL
ALT: 20 U/L (ref 0–35)
AST: 20 U/L (ref 0–37)
Alkaline Phosphatase: 45 U/L (ref 39–117)
Bilirubin, Direct: 0 mg/dL (ref 0.0–0.3)
Total Protein: 6.9 g/dL (ref 6.0–8.3)

## 2011-06-14 LAB — HEMOGLOBIN A1C: Hgb A1c MFr Bld: 6.6 % — ABNORMAL HIGH (ref 4.6–6.5)

## 2011-06-24 MED ORDER — METFORMIN HCL ER 500 MG PO TB24: 1000.0000 mg | ORAL_TABLET | Freq: Every day | ORAL | Status: DC

## 2011-08-13 ENCOUNTER — Other Ambulatory Visit: Payer: Self-pay | Admitting: Family Medicine

## 2011-08-27 ENCOUNTER — Telehealth: Payer: Self-pay | Admitting: Family Medicine

## 2011-08-27 NOTE — Telephone Encounter (Signed)
Pt called to find out if we have received the request from [employer] for her diabetic supplies. Call back @ [phone]

## 2011-08-28 NOTE — Telephone Encounter (Signed)
VM msg left advising patient no information received.

## 2011-10-01 ENCOUNTER — Emergency Department (HOSPITAL_COMMUNITY)
Admission: EM | Admit: 2011-10-01 | Discharge: 2011-10-01 | Disposition: A | Discharge status: Home / Self Care | Payer: Medicare Other | Attending: Emergency Medicine | Admitting: Emergency Medicine

## 2011-10-01 ENCOUNTER — Emergency Department (HOSPITAL_COMMUNITY): Payer: Medicare Other

## 2011-10-01 DIAGNOSIS — W19XXXA Unspecified fall, initial encounter: Secondary | ICD-10-CM

## 2011-10-01 DIAGNOSIS — Y998 Other external cause status: Secondary | ICD-10-CM | POA: Insufficient documentation

## 2011-10-01 DIAGNOSIS — Y92009 Unspecified place in unspecified non-institutional (private) residence as the place of occurrence of the external cause: Secondary | ICD-10-CM | POA: Insufficient documentation

## 2011-10-01 DIAGNOSIS — E119 Type 2 diabetes mellitus without complications: Secondary | ICD-10-CM | POA: Insufficient documentation

## 2011-10-01 DIAGNOSIS — Z79899 Other long term (current) drug therapy: Secondary | ICD-10-CM | POA: Insufficient documentation

## 2011-10-01 DIAGNOSIS — Y93E8 Activity, other personal hygiene: Secondary | ICD-10-CM | POA: Insufficient documentation

## 2011-10-01 DIAGNOSIS — K219 Gastro-esophageal reflux disease without esophagitis: Secondary | ICD-10-CM | POA: Insufficient documentation

## 2011-10-01 DIAGNOSIS — R51 Headache: Secondary | ICD-10-CM | POA: Insufficient documentation

## 2011-10-01 DIAGNOSIS — R42 Dizziness and giddiness: Secondary | ICD-10-CM | POA: Insufficient documentation

## 2011-10-01 DIAGNOSIS — S0100XA Unspecified open wound of scalp, initial encounter: Secondary | ICD-10-CM | POA: Insufficient documentation

## 2011-10-01 DIAGNOSIS — W1811XA Fall from or off toilet without subsequent striking against object, initial encounter: Secondary | ICD-10-CM | POA: Insufficient documentation

## 2011-10-01 DIAGNOSIS — S0101XA Laceration without foreign body of scalp, initial encounter: Secondary | ICD-10-CM

## 2011-10-01 DIAGNOSIS — G589 Mononeuropathy, unspecified: Secondary | ICD-10-CM | POA: Insufficient documentation

## 2011-10-01 DIAGNOSIS — I10 Essential (primary) hypertension: Secondary | ICD-10-CM | POA: Insufficient documentation

## 2011-10-01 LAB — BASIC METABOLIC PANEL
BUN: 18 mg/dL (ref 6–23)
CO2: 31 mEq/L (ref 19–32)
Calcium: 9.4 mg/dL (ref 8.4–10.5)
Chloride: 101 mEq/L (ref 96–112)
Creatinine, Ser: 0.68 mg/dL (ref 0.50–1.10)
GFR calc Af Amer: >90 mL/min (ref >90)
GFR calc non Af Amer: >90 mL/min (ref >90)
Glucose, Bld: 113 mg/dL — ABNORMAL HIGH (ref 70–99)
Potassium: 3.5 mEq/L (ref 3.5–5.1)
Sodium: 140 mEq/L (ref 135–145)

## 2011-10-01 LAB — DIFFERENTIAL
Basophils Absolute: 0 10*3/uL (ref 0.0–0.1)
Basophils Relative: 0 % (ref 0–1)
Eosinophils Absolute: 0.1 10*3/uL (ref 0.0–0.7)
Eosinophils Relative: 2 % (ref 0–5)
Lymphocytes Relative: 23 % (ref 12–46)
Lymphs Abs: 1.6 10*3/uL (ref 0.7–4.0)
Monocytes Absolute: 0.6 10*3/uL (ref 0.1–1.0)
Monocytes Relative: 8 % (ref 3–12)
Neutro Abs: 4.9 10*3/uL (ref 1.7–7.7)
Neutrophils Relative %: 67 % (ref 43–77)

## 2011-10-01 LAB — CBC
HCT: 39.2 % (ref 36.0–46.0)
Hemoglobin: 12.8 g/dL (ref 12.0–15.0)
MCH: 27.4 pg (ref 26.0–34.0)
MCHC: 32.7 g/dL (ref 30.0–36.0)
MCV: 83.9 fL (ref 78.0–100.0)
Platelets: 183 10*3/uL (ref 150–400)
RBC: 4.67 MIL/uL (ref 3.87–5.11)
RDW: 12.8 % (ref 11.5–15.5)
WBC: 7.2 10*3/uL (ref 4.0–10.5)

## 2011-10-01 NOTE — ED Notes (Addendum)
Pt states fell in the shower and hit the runner in the shower, c/o dizziness this morning around 6:30 a.m, woke up on the floor,denied chest pain with episode. pt has a laceration to the right side of her head, no complaint of nausea, weakness or dizziness at this time, pt complains of pain on the right side of her neck. Pt reports recent dizziness when standing and near syncope but no syncopal events

## 2011-10-01 NOTE — ED Notes (Signed)
Pt is back in room from radiology 

## 2011-10-01 NOTE — Discharge Instructions (Signed)

## 2011-10-01 NOTE — ED Notes (Signed)
Pt is currently in radiology. 

## 2011-10-01 NOTE — ED Notes (Signed)
MD at bedside. 

## 2011-10-01 NOTE — ED Provider Notes (Signed)
History     CSN: [id number]  Arrival date & time 10/01/11  4098   First MD Initiated Contact with Patient 10/01/11 [phone]      No chief complaint on file.   Patient is a Brittany [name] presenting with fall. The history is provided by the patient.  Fall The accident occurred 1 to 2 hours ago. Incident: patient falls asleep frequently on the toilet; she believes she fell asleep today, but was light-headed prior to sitting on the toilet. Denies any current symptoms except a headache at the site of the laceration. She fell from a height of 1 to 2 ft. She landed on a hard floor (runner of the shower). The volume of blood lost was minimal. The point of impact was the head. The pain is present in the head. The pain is mild. She was ambulatory at the scene. There was no entrapment after the fall. There was no drug use involved in the accident. There was no alcohol use involved in the accident. Associated symptoms include headaches (at site of laceration). Pertinent negatives include no fever, no numbness, no abdominal pain and no vomiting. She has tried nothing for the symptoms.    Past Medical History  Diagnosis Date  . Hypertension   . GERD (gastroesophageal reflux disease)   . Diabetes mellitus   . Neuropathy     Past Surgical History  Procedure Date  . Lumbar fusion     Family History  Problem Relation Age of Onset  . Coronary artery disease    . Diabetes      History  Substance Use Topics  . Smoking status: Never Smoker   . Smokeless tobacco: Never Used  . Alcohol Use: No    OB History    Grav Para Term Preterm Abortions TAB SAB Ect Mult Living                  Review of Systems  Constitutional: Negative for fever, chills, activity change and appetite change.  HENT: Negative for neck pain and neck stiffness.   Respiratory: Negative for cough and shortness of breath.   Cardiovascular: Negative for chest pain and palpitations.  Gastrointestinal: Negative for vomiting,  abdominal pain, diarrhea and constipation.  Genitourinary: Negative for dysuria, decreased urine volume and difficulty urinating.  Skin: Positive for wound. Negative for rash.  Neurological: Positive for light-headedness and headaches (at site of laceration). Negative for dizziness, seizures, syncope, facial asymmetry, speech difficulty, weakness and numbness.  Psychiatric/Behavioral: Negative for confusion and agitation.  All other systems reviewed and are negative.    Allergies  Sulfonamide derivatives  Home Medications   Current Outpatient Rx  Name Route Sig Dispense Refill  . AMLODIPINE BESYLATE 5 MG PO TABS  TAKE ONE TABLET BY MOUTH EVERY DAY 90 tablet 0    Physical Exam due now  . ATORVASTATIN CALCIUM 20 MG PO TABS Oral Take 20 mg by mouth daily.    [name] BISOPROLOL-HYDROCHLOROTHIAZIDE 10-6.25 MG PO TABS  TAKE ONE TABLET BY MOUTH EVERY DAY 90 tablet 1  . DOXEPIN HCL 100 MG PO CAPS Oral Take 1 capsule (100 mg total) by mouth at bedtime.    [name] ELEMENT TEST VI STRP      . ESCITALOPRAM OXALATE 10 MG PO TABS  TAKE ONE TABLET BY MOUTH EVERY DAY 30 tablet 3  . ESCITALOPRAM OXALATE 10 MG PO TABS  TAKE ONE TABLET BY MOUTH EVERY DAY 30 tablet 5  . ESTRADIOL 1 MG PO TABS  TAKE  ONE TABLET BY MOUTH EVERY DAY 30 tablet 5  . GABAPENTIN 800 MG PO TABS Oral Take 1 tablet by mouth 4 (four) times daily.     [name] LANCETS MISC      . METFORMIN HCL ER 500 MG PO TB24 Oral Take 2 tablets (1,000 mg total) by mouth at bedtime. 60 tablet 2  . MORPHINE SULFATE ER 60 MG PO TB12 Oral Take 1 tablet (60 mg total) by mouth every 8 (eight) hours as needed for pain. 90 tablet 0  . OMEPRAZOLE 40 MG PO CPDR  TAKE ONE CAPSULE  BY MOUTH EVERY DAY 30 capsule 11  . TIZANIDINE HCL 4 MG PO TABS Oral Take 1 tablet by mouth every 8 (eight) hours as needed.       There were no vitals taken for this visit.  Physical Exam  Nursing note and vitals reviewed. Constitutional: She is oriented to person, place, and time. She appears  well-developed and well-nourished.  HENT:  Head: Normocephalic.  Right Ear: External ear normal.  Left Ear: External ear normal.  Nose: Nose normal.  Mouth/Throat: Oropharynx is clear and moist. No oropharyngeal exudate.       Right parietal scalp laceration   Eyes: Conjunctivae are normal. Pupils are equal, round, and reactive to light.  Neck: Normal range of motion. Neck supple.  Cardiovascular: Normal rate, regular rhythm, normal heart sounds and intact distal pulses.  Exam reveals no gallop and no friction rub.   No murmur heard. Pulmonary/Chest: Effort normal and breath sounds normal. No respiratory distress. She has no wheezes. She has no rales. She exhibits no tenderness.  Abdominal: Soft. Bowel sounds are normal. She exhibits no distension and no mass. There is no tenderness. There is no rebound and no guarding.  Musculoskeletal: Normal range of motion. She exhibits no edema and no tenderness.  Neurological: She is alert and oriented to person, place, and time. She displays normal reflexes. No cranial nerve deficit. She exhibits normal muscle tone. Coordination normal.  Skin: Skin is warm and dry.       3cm superficial, hemostatic laceration to right parietal scalp   Psychiatric: She has a normal mood and affect. Her behavior is normal. Judgment and thought content normal.    ED Course  LACERATION REPAIR Date/Time: 10/01/2011 10:20 AM Performed by: Clemetine Marker Authorized by: Raeford Razor Consent: Verbal consent obtained. Risks and benefits: risks, benefits and alternatives were discussed Consent given by: patient Patient understanding: patient states understanding of the procedure being performed Body area: head/neck Location details: scalp Tendon involvement: none Nerve involvement: none Vascular damage: no Irrigation solution: saline Irrigation method: jet lavage Amount of cleaning: standard Debridement: none Degree of undermining: none Skin closure:  glue Approximation: close Approximation difficulty: simple Patient tolerance: Patient tolerated the procedure well with no immediate complications.   (including critical care time)  Labs Reviewed  BASIC METABOLIC PANEL - Abnormal; Notable for the following:    Glucose, Bld 113 (*)     All other components within normal limits  CBC  DIFFERENTIAL   Ct Head Wo Contrast  10/01/2011  *RADIOLOGY REPORT*  Clinical Data: Fall, head injury, right parietal laceration  CT HEAD WITHOUT CONTRAST  Technique:  Contiguous axial images were obtained from the base of the skull through the vertex without contrast.  Comparison: None.  Findings: No evidence of parenchymal hemorrhage or extra-axial fluid collection. No mass lesion, mass effect, or midline shift.  No CT evidence of acute infarction.  Mild subcortical white matter  and periventricular small vessel ischemic changes.  The visualized paranasal sinuses are essentially clear. The mastoid air cells are unopacified.  Soft tissue laceration overlying the right frontoparietal region.  No underlying osseous abnormality.  No evidence of calvarial fracture.  IMPRESSION: Soft tissue laceration overlying the right frontoparietal region. No evidence of calvarial fracture.  No evidence of acute intracranial abnormality.  Original Report Authenticated By: Charline Bills, M.D.     1. Fall   2. Scalp laceration      Date: 10/01/2011  Rate: 60 bpm  Rhythm: normal sinus rhythm  QRS Axis: normal  Intervals: normal  ST/T Wave abnormalities: nonspecific ST changes  Conduction Disutrbances:none  Narrative Interpretation: No evidence of acute ischemia or arrythmia  Old EKG Reviewed: none available   MDM  66 yo F presents after fall while sleeping on the toilet. Patient states she has been under a lot of stress since her husband and brother died in the past year and that she has trouble sleeping and therefore sometimes falls asleep on the toilet. However, she  states she was light-headed prior to her fall this morning. Denies chest pain, dyspnea, palpitations, weakness, nausea, or changes in vision. No focal neuro deficits. Superficial hemostatic lac to right parietal scalp; will close with dermabond (see procedure note). Tetanus status already UTD.   Because pt is unclear for sure if this was mechanical, labs and EKG obtained and negative for evidence of ischemia, arrythmia, acute renal failure, or electrolyte abnormalities. Clinical picture not concerning for dehydration. Head CT negative for evidence of intracranial bleed or skull fracture. Patient given return precautions, including worsening of signs or symptoms. Patient instructed to follow-up with primary care physician.        Clemetine Marker, MD 10/01/11 [phone]

## 2011-10-10 NOTE — ED Provider Notes (Signed)
I saw and evaluated the patient, reviewed the resident's note and I agree with the findings and plan.  65yf with fall from toilet. Not sure if feell asleep or syncope while using bathroom. No acute focal neuro deficits. CT neg. W/u reassuring including EKG. Return precautions discussed. outpt fu.  Raeford Razor, MD 10/10/11 1426

## 2011-10-22 ENCOUNTER — Other Ambulatory Visit: Payer: Self-pay | Admitting: Family Medicine

## 2011-11-18 ENCOUNTER — Other Ambulatory Visit: Payer: Self-pay | Admitting: Family Medicine

## 2011-12-19 ENCOUNTER — Telehealth: Payer: Self-pay | Admitting: Family Medicine

## 2011-12-19 MED ORDER — BISOPROLOL-HYDROCHLOROTHIAZIDE 10-6.25 MG PO TABS: ORAL_TABLET | ORAL | Status: DC

## 2011-12-19 MED ORDER — METFORMIN HCL ER 500 MG PO TB24: 1000.0000 mg | ORAL_TABLET | Freq: Every day | ORAL | Status: DC

## 2011-12-19 MED ORDER — AMLODIPINE BESYLATE 5 MG PO TABS: 5.0000 mg | ORAL_TABLET | Freq: Every day | ORAL | Status: DC

## 2011-12-19 MED ORDER — ESTRADIOL 1 MG PO TABS: 1.0000 mg | ORAL_TABLET | Freq: Every day | ORAL | Status: DC

## 2011-12-19 MED ORDER — ESCITALOPRAM OXALATE 10 MG PO TABS: 10.0000 mg | ORAL_TABLET | Freq: Every day | ORAL | Status: DC

## 2011-12-19 MED ORDER — SIMVASTATIN 40 MG PO TABS: 40.0000 mg | ORAL_TABLET | Freq: Every day | ORAL | Status: DC

## 2011-12-19 NOTE — Telephone Encounter (Signed)
Refill: Estradiol 1mg . Take 1 by mouth daily. Qty 30. Last fill 11-19-11 Ziac 10/6.25. Take 1 tablet by mouth every day. Qty 30. Last fill 11-19-11 Simvastatin 40mg . Take 1 tablet by mouth every day at bedtime. Last fill 11-19-11 Amlodipine 5mg . Take 1 tablet by mouth every day. Qty 30. Metformin er 500mg . Take 1,000 mg by mouth at bedtime. Qty 60. Last fill 11-19-11 Lexapro 10mg . Take 10mg  by mouth daily.

## 2011-12-23 MED ORDER — AMLODIPINE BESYLATE 5 MG PO TABS: 5.0000 mg | ORAL_TABLET | Freq: Every day | ORAL | Status: DC

## 2011-12-23 MED ORDER — ESTRADIOL 1 MG PO TABS: 1.0000 mg | ORAL_TABLET | Freq: Every day | ORAL | Status: DC

## 2011-12-23 MED ORDER — ESCITALOPRAM OXALATE 10 MG PO TABS: 10.0000 mg | ORAL_TABLET | Freq: Every day | ORAL | Status: DC

## 2011-12-23 MED ORDER — SIMVASTATIN 40 MG PO TABS: 40.0000 mg | ORAL_TABLET | Freq: Every day | ORAL | Status: DC

## 2011-12-23 MED ORDER — BISOPROLOL-HYDROCHLOROTHIAZIDE 10-6.25 MG PO TABS: ORAL_TABLET | ORAL | Status: DC

## 2011-12-23 MED ORDER — METFORMIN HCL ER 500 MG PO TB24: 1000.0000 mg | ORAL_TABLET | Freq: Every day | ORAL | Status: DC

## 2011-12-23 NOTE — Addendum Note (Signed)
Addended by: Arnette Norris on: 12/23/2011 08:39 AM   Modules accepted: Orders

## 2011-12-23 NOTE — Telephone Encounter (Addendum)
Refill requests for medications below should be sent to [id number] on [address] instead of [facility] on [address]

## 2011-12-24 NOTE — Telephone Encounter (Signed)
Called pt. No answering machine/voicemail. Will try again later.

## 2012-01-03 ENCOUNTER — Telehealth: Payer: Self-pay | Admitting: Family Medicine

## 2012-01-03 NOTE — Telephone Encounter (Signed)
Pt is scheduled for next available CPE, 03-26-12.   Message from Dr. Laury Axon: Pt has not been here in over a year---needs ov and labs---refill until ov -----

## 2012-01-20 ENCOUNTER — Other Ambulatory Visit: Payer: Self-pay | Admitting: Family Medicine

## 2012-01-21 NOTE — Telephone Encounter (Signed)
Who is taking care of Diabetes---- if it is [place] she needs labs-----------can't wait until dec  250.00  272.4  Lipid, hep, bmp, hgba1c, microalbumin

## 2012-01-21 NOTE — Telephone Encounter (Signed)
Called pt to clarify whom is managing her DM, left message to call office to advise MD Lowne instructions

## 2012-01-21 NOTE — Telephone Encounter (Signed)
Appt scheduled for 03/26/12 haven't been seen in along time. All Rxs filled on 12/23/11 #30 with no refills, glucophage #60 no refills. Plz advise   MW

## 2012-01-21 NOTE — Telephone Encounter (Signed)
Also add below faxed today  1-Omeprazole dr 40mg  cap #90 take one capsule by mouth every day-- last fill 10.14.13--per fax  2-Novasc 5mg  tab #90 take one tablet by mouth every day -- last fill 9.13.13   NOTE fax has PCP as Tabori,Yvonne   Also note pt has CPE scheduled for 12.19.13

## 2012-01-22 ENCOUNTER — Telehealth: Payer: Self-pay | Admitting: *Deleted

## 2012-01-22 NOTE — Telephone Encounter (Signed)
OV 12/17/10. Meds last filled 12/23/11 Metformin #60 x 0, lexapro #30 x0, simvastatin #30 x 0, Ziac #30 x 0.  Plz advise   MW

## 2012-01-22 NOTE — Telephone Encounter (Signed)
Pt called back in and advised that MD Lowne does manage her DM, advised pt she needs to have labs drawn before her December apt in order to make sure her medications are working properly, pt understood however wanted to call back to schedule the lab apt after consulting her calander Noted the following labs needed per phone note from MD [facility] noted 01-20-12  Loreen Freud, DO 01/21/2012 5:52 PM Signed  Who is taking care of Diabetes---- if it is [place] she needs labs-----------can't wait until dec  250.00 272.4 Lipid, hep, bmp, hgba1c, microalbumin

## 2012-01-22 NOTE — Telephone Encounter (Signed)
Pt called back in and advised that MD Lowne does manage her DM, advised pt she needs to have labs drawn before her December apt in order to make sure her medications are working properly, pt understood however wanted to call back to schedule the lab apt after consulting her calander  Noted the following labs needed per phone note from MD Lowne noted 01-20-12

## 2012-01-23 ENCOUNTER — Other Ambulatory Visit (HOSPITAL_COMMUNITY): Payer: Self-pay | Admitting: Family Medicine

## 2012-01-24 MED ORDER — BISOPROLOL-HYDROCHLOROTHIAZIDE 10-6.25 MG PO TABS: 1.0000 | ORAL_TABLET | Freq: Every day | ORAL | Status: DC

## 2012-01-24 NOTE — Telephone Encounter (Signed)
Refill done.  

## 2012-01-24 NOTE — Telephone Encounter (Signed)
Ok for 1 month of each but no refills w/out appt per Dr Laury Axon

## 2012-01-24 NOTE — Telephone Encounter (Signed)
Ok for 1 month of each but no refills w/out appt per Dr Laury Axon.    MW

## 2012-01-24 NOTE — Addendum Note (Signed)
Addended by: Derry Lory A on: 01/24/2012 04:37 PM   Modules accepted: Orders

## 2012-01-24 NOTE — Telephone Encounter (Signed)
rx sent to pharmacy by e-script  

## 2012-01-30 NOTE — Telephone Encounter (Signed)
Pt need ov 

## 2012-01-31 ENCOUNTER — Other Ambulatory Visit: Payer: Medicare Other

## 2012-01-31 NOTE — Telephone Encounter (Signed)
Apt scheduled 03/26/12--  KP

## 2012-02-15 ENCOUNTER — Other Ambulatory Visit: Payer: Self-pay | Admitting: Family Medicine

## 2012-02-17 ENCOUNTER — Other Ambulatory Visit (HOSPITAL_COMMUNITY): Payer: Self-pay | Admitting: Family Medicine

## 2012-02-17 MED ORDER — ESCITALOPRAM OXALATE 10 MG PO TABS: 10.0000 mg | ORAL_TABLET | Freq: Every day | ORAL | Status: DC

## 2012-02-17 MED ORDER — BISOPROLOL-HYDROCHLOROTHIAZIDE 10-6.25 MG PO TABS: 1.0000 | ORAL_TABLET | Freq: Every day | ORAL | Status: DC

## 2012-02-17 MED ORDER — SIMVASTATIN 40 MG PO TABS: 40.0000 mg | ORAL_TABLET | Freq: Every day | ORAL | Status: DC

## 2012-02-17 MED ORDER — METFORMIN HCL ER 500 MG PO TB24: 1000.0000 mg | ORAL_TABLET | Freq: Every day | ORAL | Status: DC

## 2012-02-17 NOTE — Telephone Encounter (Signed)
Received via fax x 4 refill requests for  1-Glucophage XR 500MG  Tab #60 Take 2 tablets by mouth at bedtime --last fill 10.18.13 2-lexapro 10mg  Tab #30 Take one tablet by mouth every day #30--Last fill 10.18.13 3-Zocor 40MG  Tab #30 Take one tablet by mouth at bedtime #30--Last fill 10.18.13 4-Bisopr/HCTZ 10/6.25 TAB #30 Take one tablet by mouth every day #30--last fill 10.18.13

## 2012-03-08 ENCOUNTER — Other Ambulatory Visit: Payer: Self-pay | Admitting: Family Medicine

## 2012-03-09 ENCOUNTER — Telehealth: Payer: Self-pay

## 2012-03-09 DIAGNOSIS — E119 Type 2 diabetes mellitus without complications: Secondary | ICD-10-CM

## 2012-03-09 DIAGNOSIS — Z0000 Encounter for general adult medical examination without abnormal findings: Secondary | ICD-10-CM

## 2012-03-09 DIAGNOSIS — E785 Hyperlipidemia, unspecified: Secondary | ICD-10-CM

## 2012-03-09 NOTE — Telephone Encounter (Signed)
Letter from [facility] advising Amlodipine and Simvastatin has the potential of causing myopathy and rhabdomyolysis. Per Dr.Lowne change the Simvastatin to Lipitor 10 mg 1 po qhs #30 with 2 refills. Letter sent to be scanned----     Patient is also due for labs she had not had labs done since 06/14/11.   Msg left to call the office     KP

## 2012-03-10 MED ORDER — ATORVASTATIN CALCIUM 10 MG PO TABS: 10.0000 mg | ORAL_TABLET | Freq: Every day | ORAL | Status: DC

## 2012-03-10 NOTE — Telephone Encounter (Signed)
Discussed with patient and she voiced understanding she agreed to start meds and will be in on 03/12/12 for CPE labs         KP

## 2012-03-12 ENCOUNTER — Other Ambulatory Visit: Payer: Medicare Other

## 2012-03-23 ENCOUNTER — Other Ambulatory Visit: Payer: Self-pay | Admitting: Family Medicine

## 2012-03-23 ENCOUNTER — Encounter: Payer: Self-pay | Admitting: Family Medicine

## 2012-03-26 ENCOUNTER — Encounter: Payer: Medicare Other | Admitting: Family Medicine

## 2012-04-11 ENCOUNTER — Other Ambulatory Visit: Payer: Self-pay | Admitting: Family Medicine

## 2012-04-14 ENCOUNTER — Encounter: Payer: Medicare Other | Admitting: Family Medicine

## 2012-04-20 ENCOUNTER — Other Ambulatory Visit: Payer: Self-pay | Admitting: Family Medicine

## 2012-04-23 NOTE — Telephone Encounter (Signed)
2nd request for bisopr/hctz 10/6.25 tab

## 2012-04-27 ENCOUNTER — Other Ambulatory Visit: Payer: Self-pay | Admitting: Family Medicine

## 2012-04-27 MED ORDER — METFORMIN HCL ER 500 MG PO TB24: ORAL_TABLET | ORAL | Status: DC

## 2012-04-27 NOTE — Telephone Encounter (Signed)
refill MetFORMIN HCl (Tablet SR 24 hr)  500 MG TAKE TWO TABLETS BY MOUTH EVERY DAY WITH  BREAKFAST --requesting a 90-day supply last fill 12.4.13 NOTE no new labs on file ---CPE scheduled for 06/18/12

## 2012-05-14 ENCOUNTER — Other Ambulatory Visit: Payer: Self-pay | Admitting: Family Medicine

## 2012-05-15 NOTE — Telephone Encounter (Signed)
This is the second request.  Rx refill has been request.//AB/CMA

## 2012-05-27 ENCOUNTER — Other Ambulatory Visit: Payer: Self-pay | Admitting: Family Medicine

## 2012-06-01 ENCOUNTER — Other Ambulatory Visit (HOSPITAL_COMMUNITY): Payer: Self-pay | Admitting: Anesthesiology

## 2012-06-01 ENCOUNTER — Ambulatory Visit (HOSPITAL_COMMUNITY)
Admission: RE | Admit: 2012-06-01 | Discharge: 2012-06-01 | Disposition: A | Discharge status: Home / Self Care | Payer: Medicare Other | Source: Ambulatory Visit | Attending: Anesthesiology | Admitting: Anesthesiology

## 2012-06-01 DIAGNOSIS — R52 Pain, unspecified: Secondary | ICD-10-CM

## 2012-06-01 DIAGNOSIS — M25419 Effusion, unspecified shoulder: Secondary | ICD-10-CM | POA: Insufficient documentation

## 2012-06-01 DIAGNOSIS — M25519 Pain in unspecified shoulder: Secondary | ICD-10-CM | POA: Insufficient documentation

## 2012-06-04 ENCOUNTER — Other Ambulatory Visit (HOSPITAL_COMMUNITY): Payer: Self-pay | Admitting: Anesthesiology

## 2012-06-04 ENCOUNTER — Ambulatory Visit (HOSPITAL_COMMUNITY)
Admission: RE | Admit: 2012-06-04 | Discharge: 2012-06-04 | Disposition: A | Discharge status: Home / Self Care | Payer: Medicare Other | Source: Ambulatory Visit | Attending: Anesthesiology | Admitting: Anesthesiology

## 2012-06-04 DIAGNOSIS — R222 Localized swelling, mass and lump, trunk: Secondary | ICD-10-CM

## 2012-06-18 ENCOUNTER — Ambulatory Visit (INDEPENDENT_AMBULATORY_CARE_PROVIDER_SITE_OTHER): Payer: Medicare Other | Admitting: Family Medicine

## 2012-06-18 ENCOUNTER — Encounter: Payer: Self-pay | Admitting: Family Medicine

## 2012-06-18 VITALS — BP 120/72 | HR 64 | Temp 99.0°F | Ht 68.5 in | Wt 187.2 lb

## 2012-06-18 DIAGNOSIS — E785 Hyperlipidemia, unspecified: Secondary | ICD-10-CM

## 2012-06-18 DIAGNOSIS — F32A Depression, unspecified: Secondary | ICD-10-CM

## 2012-06-18 DIAGNOSIS — I10 Essential (primary) hypertension: Secondary | ICD-10-CM

## 2012-06-18 DIAGNOSIS — E119 Type 2 diabetes mellitus without complications: Secondary | ICD-10-CM

## 2012-06-18 DIAGNOSIS — R222 Localized swelling, mass and lump, trunk: Secondary | ICD-10-CM

## 2012-06-18 DIAGNOSIS — K219 Gastro-esophageal reflux disease without esophagitis: Secondary | ICD-10-CM

## 2012-06-18 DIAGNOSIS — F329 Major depressive disorder, single episode, unspecified: Secondary | ICD-10-CM

## 2012-06-18 DIAGNOSIS — Z0000 Encounter for general adult medical examination without abnormal findings: Secondary | ICD-10-CM

## 2012-06-18 LAB — HEMOGLOBIN A1C: Hgb A1c MFr Bld: 6.4 % (ref 4.6–6.5)

## 2012-06-18 LAB — CBC WITH DIFFERENTIAL/PLATELET
Basophils Relative: 0.3 % (ref 0.0–3.0)
Eosinophils Relative: 3.7 % (ref 0.0–5.0)
Lymphocytes Relative: 20.3 % (ref 12.0–46.0)
Neutrophils Relative %: 67.8 % (ref 43.0–77.0)
Platelets: 208 10*3/uL (ref 150.0–400.0)
RBC: 4.65 Mil/uL (ref 3.87–5.11)
WBC: 7.9 10*3/uL (ref 4.5–10.5)

## 2012-06-18 LAB — LIPID PANEL
HDL: 64.8 mg/dL (ref >39.00)
LDL Cholesterol: 70 mg/dL (ref 0–99)
Total CHOL/HDL Ratio: 2
Triglycerides: 63 mg/dL (ref 0.0–149.0)

## 2012-06-18 LAB — BASIC METABOLIC PANEL
Calcium: 9.1 mg/dL (ref 8.4–10.5)
GFR: 83.92 mL/min (ref >60.00)
Potassium: 3.8 mEq/L (ref 3.5–5.1)
Sodium: 139 mEq/L (ref 135–145)

## 2012-06-18 LAB — HEPATIC FUNCTION PANEL: Albumin: 3.7 g/dL (ref 3.5–5.2)

## 2012-06-18 LAB — TSH: TSH: 0.46 u[IU]/mL (ref 0.35–5.50)

## 2012-06-18 MED ORDER — METFORMIN HCL ER 500 MG PO TB24: ORAL_TABLET | ORAL | Status: DC

## 2012-06-18 MED ORDER — ESCITALOPRAM OXALATE 10 MG PO TABS: ORAL_TABLET | ORAL | Status: DC

## 2012-06-18 MED ORDER — AMLODIPINE BESYLATE 5 MG PO TABS: ORAL_TABLET | ORAL | Status: DC

## 2012-06-18 MED ORDER — BISOPROLOL-HYDROCHLOROTHIAZIDE 10-6.25 MG PO TABS: 1.0000 | ORAL_TABLET | Freq: Every day | ORAL | Status: DC

## 2012-06-18 MED ORDER — ATORVASTATIN CALCIUM 10 MG PO TABS: ORAL_TABLET | ORAL | Status: DC

## 2012-06-18 MED ORDER — DOXEPIN HCL 100 MG PO CAPS: 100.0000 mg | ORAL_CAPSULE | Freq: Every day | ORAL | Status: DC

## 2012-06-18 MED ORDER — OMEPRAZOLE 40 MG PO CPDR: DELAYED_RELEASE_CAPSULE | ORAL | Status: DC

## 2012-06-18 NOTE — Progress Notes (Signed)
Subjective:    Brittany [name] is a 67 y.o. female who presents for Medicare Annual/Subsequent preventive examination.  Preventive Screening-Counseling & Management  Tobacco History  Smoking status  . Never Smoker   Smokeless tobacco  . Never Used     Problems Prior to Visit 1. Knot on neck  Current Problems (verified) Patient Active Problem List  Diagnosis  . Type II or unspecified type diabetes mellitus without mention of complication, not stated as uncontrolled  . NEUROPATHY  . HYPERTENSION  . OTHER ACUTE SINUSITIS  . URI  . GERD  . HOT FLASHES  . KNEE PAIN, LEFT  . LOW BACK PAIN  . OSTEOPOROSIS  . INSOMNIA  . NAUSEA  . DIZZINESS  . DYSPEPSIA&OTHER SPEC DISORDERS FUNCTION STOMACH  . DIARRHEA  . Hair loss  . Fatigue    Medications Prior to Visit Current Outpatient Prescriptions on File Prior to Visit  Medication Sig Dispense Refill  . amLODipine (NORVASC) 5 MG tablet TAKE ONE TABLET BY MOUTH EVERY DAY  30 tablet  2  . aspirin 325 MG tablet Take 325 mg by mouth daily.      [name] atorvastatin (LIPITOR) 10 MG tablet TAKE ONE TABLET BY MOUTH EVERY DAY  30 tablet  1  . bisoprolol-hydrochlorothiazide (ZIAC) 10-6.25 MG per tablet Take 1 tablet by mouth daily.  30 tablet  0  . calcium carbonate (TUMS EX) 750 MG chewable tablet Chew 1 tablet by mouth 4 (four) times daily as needed. For acid reflux      . Cholecalciferol (VITAMIN D) 1000 UNITS capsule Take 1,000 Units by mouth daily.      [name] docusate sodium (COLACE) 100 MG capsule Take 100 mg by mouth 2 (two) times daily.      [name] doxepin (SINEQUAN) 100 MG capsule Take 1 capsule (100 mg total) by mouth at bedtime.      [name] escitalopram (LEXAPRO) 10 MG tablet TAKE ONE TABLET BY MOUTH EVERY DAY  30 tablet  2  . estradiol (ESTRACE) 1 MG tablet Take 1 tablet (1 mg total) by mouth daily.  30 tablet  0  . gabapentin (NEURONTIN) 800 MG tablet Take 1 tablet by mouth 4 (four) times daily.       [name] loratadine (CLARITIN) 10 MG tablet Take 10  mg by mouth daily as needed. For allergies      . metFORMIN (GLUCOPHAGE-XR) 500 MG 24 hr tablet TAKE TWO TABLETS BY MOUTH EVERY DAY WITH  BREAKFAST  60 tablet  0  . morphine (MS CONTIN) 60 MG 12 hr tablet Take 60 mg by mouth 3 (three) times daily.      . Multiple Vitamin (MULTIVITAMIN WITH MINERALS) TABS Take 1 tablet by mouth daily.      [name] omega-3 acid ethyl esters (LOVAZA) 1 G capsule Take 1 g by mouth daily.      [name] omeprazole (PRILOSEC) 40 MG capsule TAKE ONE CAPSULE BY MOUTH EVERY DAY  30 capsule  0  . OVER THE COUNTER MEDICATION Take 1 tablet by mouth daily. Hair, skin, and nails vitamin      . tiZANidine (ZANAFLEX) 4 MG tablet Take 1 tablet by mouth 3 (three) times daily.        No current facility-administered medications on file prior to visit.    Current Medications (verified) Current Outpatient Prescriptions  Medication Sig Dispense Refill  . amLODipine (NORVASC) 5 MG tablet TAKE ONE TABLET BY MOUTH EVERY DAY  30 tablet  2  . aspirin 325 MG  tablet Take 325 mg by mouth daily.      [name] atorvastatin (LIPITOR) 10 MG tablet TAKE ONE TABLET BY MOUTH EVERY DAY  30 tablet  1  . bisoprolol-hydrochlorothiazide (ZIAC) 10-6.25 MG per tablet Take 1 tablet by mouth daily.  30 tablet  0  . calcium carbonate (TUMS EX) 750 MG chewable tablet Chew 1 tablet by mouth 4 (four) times daily as needed. For acid reflux      . Cholecalciferol (VITAMIN D) 1000 UNITS capsule Take 1,000 Units by mouth daily.      [name] docusate sodium (COLACE) 100 MG capsule Take 100 mg by mouth 2 (two) times daily.      [name] doxepin (SINEQUAN) 100 MG capsule Take 1 capsule (100 mg total) by mouth at bedtime.      [name] escitalopram (LEXAPRO) 10 MG tablet TAKE ONE TABLET BY MOUTH EVERY DAY  30 tablet  2  . estradiol (ESTRACE) 1 MG tablet Take 1 tablet (1 mg total) by mouth daily.  30 tablet  0  . folic acid (FOLVITE) 800 MCG tablet Take 400 mcg by mouth daily.      [name] gabapentin (NEURONTIN) 800 MG tablet Take 1 tablet by mouth 4 (four)  times daily.       [name] l-methylfolate-B6-B12 (METANX) 3-35-2 MG TABS Take 1 tablet by mouth daily.      [name] loratadine (CLARITIN) 10 MG tablet Take 10 mg by mouth daily as needed. For allergies      . metFORMIN (GLUCOPHAGE-XR) 500 MG 24 hr tablet TAKE TWO TABLETS BY MOUTH EVERY DAY WITH  BREAKFAST  60 tablet  0  . morphine (MS CONTIN) 60 MG 12 hr tablet Take 60 mg by mouth 3 (three) times daily.      . Multiple Vitamin (MULTIVITAMIN WITH MINERALS) TABS Take 1 tablet by mouth daily.      [name] omega-3 acid ethyl esters (LOVAZA) 1 G capsule Take 1 g by mouth daily.      [name] omeprazole (PRILOSEC) 40 MG capsule TAKE ONE CAPSULE BY MOUTH EVERY DAY  30 capsule  0  . OVER THE COUNTER MEDICATION Take 1 tablet by mouth daily. Hair, skin, and nails vitamin      . tiZANidine (ZANAFLEX) 4 MG tablet Take 1 tablet by mouth 3 (three) times daily.        No current facility-administered medications for this visit.     Allergies (verified) Sulfonamide derivatives   PAST HISTORY  Family History Family History  Problem Relation Age of Onset  . Coronary artery disease    . Diabetes    . Cancer Mother     breast  . Depression Mother     bipolar  . Heart disease Father 62    MI  . Diabetes Brother   . Kidney disease Brother   . Hypertension Brother   . Hyperlipidemia Brother   . Stroke Brother     Social History History  Substance Use Topics  . Smoking status: Never Smoker   . Smokeless tobacco: Never Used  . Alcohol Use: No     Are there smokers in your home (other than you)? No  Risk Factors Current exercise habits: The patient does not participate in regular exercise at present.  Dietary issues discussed: na   Cardiac risk factors: diabetes mellitus, dyslipidemia, hypertension and sedentary lifestyle.  Depression Screen (Note: if answer to either of the following is "Yes", a more complete depression screening is indicated)   Over the past two  weeks, have you felt down, depressed or  hopeless? No  Over the past two weeks, have you felt little interest or pleasure in doing things? No  Have you lost interest or pleasure in daily life? No  Do you often feel hopeless? No  Do you cry easily over simple problems? No  Activities of Daily Living In your present state of health, do you have any difficulty performing the following activities?:  Driving? No Managing money?  No Feeding yourself? No Getting from bed to chair? No Climbing a flight of stairs? No Preparing food and eating?: No Bathing or showering? No Getting dressed: No Getting to the toilet? No Using the toilet:No Moving around from place to place: No In the past year have you fallen or had a near fall?:No   Are you sexually active?  No  Do you have more than one partner?  No  Hearing Difficulties: No Do you often ask people to speak up or repeat themselves? No Do you experience ringing or noises in your ears? No Do you have difficulty understanding soft or whispered voices? No   Do you feel that you have a problem with memory? No  Do you often misplace items? No  Do you feel safe at home?  Yes  Cognitive Testing  Alert? Yes  Normal Appearance?Yes  Oriented to person? Yes  Place? Yes   Time? Yes  Recall of three objects?  Yes  Can perform simple calculations? Yes  Displays appropriate judgment?Yes  Can read the correct time from a watch face?Yes   Advanced Directives have been discussed with the patient? Yes  List the Names of Other Physician/Practitioners you currently use: 1.  opth--[facility] 2.  D[facility] 3.pain-- Dr Loraine Leriche   Indicate any recent Medical Services you may have received from other than [facility] providers in the past year (date may be approximate).  Immunization History  Administered Date(s) Administered  . DTP 07/14/2002  . Influenza Whole 02/12/2007  . Zoster 01/02/2010    Screening Tests Health Maintenance  Topic Date Due  . Pap Smear  03/24/1966  .  Tetanus/tdap  03/25/1967  . Influenza Vaccine  12/08/2007  . Mammogram  02/25/2014  . Colonoscopy  06/11/2020  . Zostavax  Completed    All answers were reviewed with the patient and necessary referrals were made:  Loreen Freud, DO   06/18/2012   History reviewed:  She  has a past medical history of Hypertension; GERD (gastroesophageal reflux disease); Diabetes mellitus; and Neuropathy. She  does not have any pertinent problems on file. She  has past surgical history that includes Lumbar fusion. Her family history includes Cancer in her mother; Coronary artery disease in an unspecified family member; Depression in her mother; Diabetes in her brother and unspecified family member; Heart disease (age of onset: 31) in her father; Hyperlipidemia in her brother; Hypertension in her brother; Kidney disease in her brother; and Stroke in her brother. She  reports that she has never smoked. She has never used smokeless tobacco. She reports that she does not drink alcohol or use illicit drugs. She has a current medication list which includes the following prescription(s): amlodipine, aspirin, atorvastatin, bisoprolol-hydrochlorothiazide, calcium carbonate, vitamin d, docusate sodium, doxepin, escitalopram, estradiol, folic acid, gabapentin, l-methylfolate-b6-b12, loratadine, metformin, morphine, multivitamin with minerals, omega-3 acid ethyl esters, omeprazole, OVER THE COUNTER MEDICATION, and tizanidine. Current Outpatient Prescriptions on File Prior to Visit  Medication Sig Dispense Refill  . amLODipine (NORVASC) 5 MG tablet TAKE ONE TABLET BY MOUTH  EVERY DAY  30 tablet  2  . aspirin 325 MG tablet Take 325 mg by mouth daily.      [name] atorvastatin (LIPITOR) 10 MG tablet TAKE ONE TABLET BY MOUTH EVERY DAY  30 tablet  1  . bisoprolol-hydrochlorothiazide (ZIAC) 10-6.25 MG per tablet Take 1 tablet by mouth daily.  30 tablet  0  . calcium carbonate (TUMS EX) 750 MG chewable tablet Chew 1 tablet by mouth 4  (four) times daily as needed. For acid reflux      . Cholecalciferol (VITAMIN D) 1000 UNITS capsule Take 1,000 Units by mouth daily.      [name] docusate sodium (COLACE) 100 MG capsule Take 100 mg by mouth 2 (two) times daily.      [name] doxepin (SINEQUAN) 100 MG capsule Take 1 capsule (100 mg total) by mouth at bedtime.      [name] escitalopram (LEXAPRO) 10 MG tablet TAKE ONE TABLET BY MOUTH EVERY DAY  30 tablet  2  . estradiol (ESTRACE) 1 MG tablet Take 1 tablet (1 mg total) by mouth daily.  30 tablet  0  . gabapentin (NEURONTIN) 800 MG tablet Take 1 tablet by mouth 4 (four) times daily.       [name] loratadine (CLARITIN) 10 MG tablet Take 10 mg by mouth daily as needed. For allergies      . metFORMIN (GLUCOPHAGE-XR) 500 MG 24 hr tablet TAKE TWO TABLETS BY MOUTH EVERY DAY WITH  BREAKFAST  60 tablet  0  . morphine (MS CONTIN) 60 MG 12 hr tablet Take 60 mg by mouth 3 (three) times daily.      . Multiple Vitamin (MULTIVITAMIN WITH MINERALS) TABS Take 1 tablet by mouth daily.      [name] omega-3 acid ethyl esters (LOVAZA) 1 G capsule Take 1 g by mouth daily.      [name] omeprazole (PRILOSEC) 40 MG capsule TAKE ONE CAPSULE BY MOUTH EVERY DAY  30 capsule  0  . OVER THE COUNTER MEDICATION Take 1 tablet by mouth daily. Hair, skin, and nails vitamin      . tiZANidine (ZANAFLEX) 4 MG tablet Take 1 tablet by mouth 3 (three) times daily.        No current facility-administered medications on file prior to visit.   She is allergic to sulfonamide derivatives.  Review of Systems Review of Systems  Constitutional: Negative for activity change, appetite change and fatigue.  HENT: Negative for hearing loss, congestion, tinnitus and ear discharge.  dentist q40m Eyes: Negative for visual disturbance (see optho q1y -- vision corrected to 20/20 with glasses).  Respiratory: Negative for cough, chest tightness and shortness of breath.   Cardiovascular: Negative for chest pain, palpitations and leg swelling.  Gastrointestinal: Negative for  abdominal pain, diarrhea, constipation and abdominal distention.  Genitourinary: Negative for urgency, frequency, decreased urine volume and difficulty urinating.  Musculoskeletal: Negative for back pain, arthralgias and gait problem.  Skin: Negative for color change, pallor and rash.  Neurological: Negative for dizziness, light-headedness, numbness and headaches.  Hematological: Negative for adenopathy. Does not bruise/bleed easily.  Psychiatric/Behavioral: Negative for suicidal ideas, confusion, sleep disturbance, self-injury, dysphoric mood, decreased concentration and agitation.        Objective:     Vision by Snellen chart:  opth  Body mass index is 28.05 kg/(m^2). BP 120/72  Pulse 64  Temp(Src) 99 F (37.2 C) (Oral)  Ht 5' 8.5" (1.74 m)  Wt 187 lb 3.2 oz (84.913 kg)  BMI 28.05 kg/m2  SpO2 96%  BP 120/72  Pulse 64  Temp(Src) 99 F (37.2 C) (Oral)  Ht 5' 8.5" (1.74 m)  Wt 187 lb 3.2 oz (84.913 kg)  BMI 28.05 kg/m2  SpO2 96% General appearance: alert, cooperative, appears stated age and no distress Head: Normocephalic, without obvious abnormality, atraumatic Eyes: conjunctivae/corneas clear. PERRL, EOM's intact. Fundi benign. Ears: normal TM's and external ear canals both ears Nose: Nares normal. Septum midline. Mucosa normal. No drainage or sinus tenderness. Throat: lips, mucosa, and tongue normal; teeth and gums normal Neck: no adenopathy, no carotid bruit, no JVD, supple, symmetrical, trachea midline and thyroid not enlarged, symmetric, no tenderness/mass/nodules Back: symmetric, no curvature. ROM normal. No CVA tenderness. Lungs: clear to auscultation bilaterally Breasts: normal appearance, no masses or tenderness Heart: regular rate and rhythm, S1, S2 normal, no murmur, click, rub or gallop Abdomen: soft, non-tender; bowel sounds normal; no masses,  no organomegaly Pelvic: deferred and not indicated; status post hysterectomy, negative ROS Extremities:  extremities normal, atraumatic, no cyanosis or edema Pulses: 2+ and symmetric Skin: Skin color, texture, turgor normal. No rashes or lesions Lymph nodes: Cervical, supraclavicular, and axillary nodes normal. Neurologic: Alert and oriented X 3, normal strength and tone. Normal symmetric reflexes. Normal coordination and gait Psych-- no depression, no anxiety      Assessment:     cpe      Plan:     During the course of the visit the patient was educated and counseled about appropriate screening and preventive services including:    Pneumococcal vaccine   Screening electrocardiogram  Screening mammography  Bone densitometry screening  Colorectal cancer screening  Glaucoma screening  Advanced directives: has an advanced directive - a copy HAS NOT been provided.  Diet review for nutrition referral? Yes ____  Not Indicated __x__   Patient Instructions (the written plan) was given to the patient.  Medicare Attestation I have personally reviewed: The patient's medical and social history Their use of alcohol, tobacco or illicit drugs Their current medications and supplements The patient's functional ability including ADLs,fall risks, home safety risks, cognitive, and hearing and visual impairment Diet and physical activities Evidence for depression or mood disorders  The patient's weight, height, BMI, and visual acuity have been recorded in the chart.  I have made referrals, counseling, and provided education to the patient based on review of the above and I have provided the patient with a written personalized care plan for preventive services.     Loreen Freud, DO   06/18/2012

## 2012-06-18 NOTE — Patient Instructions (Signed)
Preventive Care for Adults, Female A healthy lifestyle and preventive care can promote health and wellness. Preventive health guidelines for women include the following key practices.  A routine yearly physical is a good way to check with your caregiver about your health and preventive screening. It is a chance to share any concerns and updates on your health, and to receive a thorough exam.  Visit your dentist for a routine exam and preventive care every 6 months. Brush your teeth twice a day and floss once a day. Good oral hygiene prevents tooth decay and gum disease.  The frequency of eye exams is based on your age, health, family medical history, use of contact lenses, and other factors. Follow your caregiver's recommendations for frequency of eye exams.  Eat a healthy diet. Foods like vegetables, fruits, whole grains, low-fat dairy products, and lean protein foods contain the nutrients you need without too many calories. Decrease your intake of foods high in solid fats, added sugars, and salt. Eat the right amount of calories for you.Get information about a proper diet from your caregiver, if necessary.  Regular physical exercise is one of the most important things you can do for your health. Most adults should get at least 150 minutes of moderate-intensity exercise (any activity that increases your heart rate and causes you to sweat) each week. In addition, most adults need muscle-strengthening exercises on 2 or more days a week.  Maintain a healthy weight. The body mass index (BMI) is a screening tool to identify possible weight problems. It provides an estimate of body fat based on height and weight. Your caregiver can help determine your BMI, and can help you achieve or maintain a healthy weight.For adults 20 years and older:  A BMI below 18.5 is considered underweight.  A BMI of 18.5 to 24.9 is normal.  A BMI of 25 to 29.9 is considered overweight.  A BMI of 30 and above is  considered obese.  Maintain normal blood lipids and cholesterol levels by exercising and minimizing your intake of saturated fat. Eat a balanced diet with plenty of fruit and vegetables. Blood tests for lipids and cholesterol should begin at age 20 and be repeated every 5 years. If your lipid or cholesterol levels are high, you are over 50, or you are at high risk for heart disease, you may need your cholesterol levels checked more frequently.Ongoing high lipid and cholesterol levels should be treated with medicines if diet and exercise are not effective.  If you smoke, find out from your caregiver how to quit. If you do not use tobacco, do not start.  If you are pregnant, do not drink alcohol. If you are breastfeeding, be very cautious about drinking alcohol. If you are not pregnant and choose to drink alcohol, do not exceed 1 drink per day. One drink is considered to be 12 ounces (355 mL) of beer, 5 ounces (148 mL) of wine, or 1.5 ounces (44 mL) of liquor.  Avoid use of street drugs. Do not share needles with anyone. Ask for help if you need support or instructions about stopping the use of drugs.  High blood pressure causes heart disease and increases the risk of stroke. Your blood pressure should be checked at least every 1 to 2 years. Ongoing high blood pressure should be treated with medicines if weight loss and exercise are not effective.  If you are 55 to 65 years old, ask your caregiver if you should take aspirin to prevent strokes.  Diabetes   screening involves taking a blood sample to check your fasting blood sugar level. This should be done once every 3 years, after age 45, if you are within normal weight and without risk factors for diabetes. Testing should be considered at a younger age or be carried out more frequently if you are overweight and have at least 1 risk factor for diabetes.  Breast cancer screening is essential preventive care for women. You should practice "breast  self-awareness." This means understanding the normal appearance and feel of your breasts and may include breast self-examination. Any changes detected, no matter how small, should be reported to a caregiver. Women in their 20s and 30s should have a clinical breast exam (CBE) by a caregiver as part of a regular health exam every 1 to 3 years. After age 40, women should have a CBE every year. Starting at age 40, women should consider having a mammography (breast X-ray test) every year. Women who have a family history of breast cancer should talk to their caregiver about genetic screening. Women at a high risk of breast cancer should talk to their caregivers about having magnetic resonance imaging (MRI) and a mammography every year.  The Pap test is a screening test for cervical cancer. A Pap test can show cell changes on the cervix that might become cervical cancer if left untreated. A Pap test is a procedure in which cells are obtained and examined from the lower end of the uterus (cervix).  Women should have a Pap test starting at age 21.  Between ages 21 and 29, Pap tests should be repeated every 2 years.  Beginning at age 30, you should have a Pap test every 3 years as long as the past 3 Pap tests have been normal.  Some women have medical problems that increase the chance of getting cervical cancer. Talk to your caregiver about these problems. It is especially important to talk to your caregiver if a new problem develops soon after your last Pap test. In these cases, your caregiver may recommend more frequent screening and Pap tests.  The above recommendations are the same for women who have or have not gotten the vaccine for human papillomavirus (HPV).  If you had a hysterectomy for a problem that was not cancer or a condition that could lead to cancer, then you no longer need Pap tests. Even if you no longer need a Pap test, a regular exam is a good idea to make sure no other problems are  starting.  If you are between ages 65 and 70, and you have had normal Pap tests going back 10 years, you no longer need Pap tests. Even if you no longer need a Pap test, a regular exam is a good idea to make sure no other problems are starting.  If you have had past treatment for cervical cancer or a condition that could lead to cancer, you need Pap tests and screening for cancer for at least 20 years after your treatment.  If Pap tests have been discontinued, risk factors (such as a new sexual partner) need to be reassessed to determine if screening should be resumed.  The HPV test is an additional test that may be used for cervical cancer screening. The HPV test looks for the virus that can cause the cell changes on the cervix. The cells collected during the Pap test can be tested for HPV. The HPV test could be used to screen women aged 30 years and older, and should   be used in women of any age who have unclear Pap test results. After the age of 30, women should have HPV testing at the same frequency as a Pap test.  Colorectal cancer can be detected and often prevented. Most routine colorectal cancer screening begins at the age of 50 and continues through age 75. However, your caregiver may recommend screening at an earlier age if you have risk factors for colon cancer. On a yearly basis, your caregiver may provide home test kits to check for hidden blood in the stool. Use of a small camera at the end of a tube, to directly examine the colon (sigmoidoscopy or colonoscopy), can detect the earliest forms of colorectal cancer. Talk to your caregiver about this at age 50, when routine screening begins. Direct examination of the colon should be repeated every 5 to 10 years through age 75, unless early forms of pre-cancerous polyps or small growths are found.  Hepatitis C blood testing is recommended for all people born from 1945 through 1965 and any individual with known risks for hepatitis C.  Practice  safe sex. Use condoms and avoid high-risk sexual practices to reduce the spread of sexually transmitted infections (STIs). STIs include gonorrhea, chlamydia, syphilis, trichomonas, herpes, HPV, and human immunodeficiency virus (HIV). Herpes, HIV, and HPV are viral illnesses that have no cure. They can result in disability, cancer, and death. Sexually active women aged 25 and younger should be checked for chlamydia. Older women with new or multiple partners should also be tested for chlamydia. Testing for other STIs is recommended if you are sexually active and at increased risk.  Osteoporosis is a disease in which the bones lose minerals and strength with aging. This can result in serious bone fractures. The risk of osteoporosis can be identified using a bone density scan. Women ages 65 and over and women at risk for fractures or osteoporosis should discuss screening with their caregivers. Ask your caregiver whether you should take a calcium supplement or vitamin D to reduce the rate of osteoporosis.  Menopause can be associated with physical symptoms and risks. Hormone replacement therapy is available to decrease symptoms and risks. You should talk to your caregiver about whether hormone replacement therapy is right for you.  Use sunscreen with sun protection factor (SPF) of 30 or more. Apply sunscreen liberally and repeatedly throughout the day. You should seek shade when your shadow is shorter than you. Protect yourself by wearing long sleeves, pants, a wide-brimmed hat, and sunglasses year round, whenever you are outdoors.  Once a month, do a whole body skin exam, using a mirror to look at the skin on your back. Notify your caregiver of new moles, moles that have irregular borders, moles that are larger than a pencil eraser, or moles that have changed in shape or color.  Stay current with required immunizations.  Influenza. You need a dose every fall (or winter). The composition of the flu vaccine  changes each year, so being vaccinated once is not enough.  Pneumococcal polysaccharide. You need 1 to 2 doses if you smoke cigarettes or if you have certain chronic medical conditions. You need 1 dose at age 65 (or older) if you have never been vaccinated.  Tetanus, diphtheria, pertussis (Tdap, Td). Get 1 dose of Tdap vaccine if you are younger than age 65, are over 65 and have contact with an infant, are a healthcare worker, are pregnant, or simply want to be protected from whooping cough. After that, you need a Td   booster dose every 10 years. Consult your caregiver if you have not had at least 3 tetanus and diphtheria-containing shots sometime in your life or have a deep or dirty wound.  HPV. You need this vaccine if you are a woman age 26 or younger. The vaccine is given in 3 doses over 6 months.  Measles, mumps, rubella (MMR). You need at least 1 dose of MMR if you were born in 1957 or later. You may also need a second dose.  Meningococcal. If you are age 19 to 21 and a first-year college student living in a residence hall, or have one of several medical conditions, you need to get vaccinated against meningococcal disease. You may also need additional booster doses.  Zoster (shingles). If you are age 60 or older, you should get this vaccine.  Varicella (chickenpox). If you have never had chickenpox or you were vaccinated but received only 1 dose, talk to your caregiver to find out if you need this vaccine.  Hepatitis A. You need this vaccine if you have a specific risk factor for hepatitis A virus infection or you simply wish to be protected from this disease. The vaccine is usually given as 2 doses, 6 to 18 months apart.  Hepatitis B. You need this vaccine if you have a specific risk factor for hepatitis B virus infection or you simply wish to be protected from this disease. The vaccine is given in 3 doses, usually over 6 months. Preventive Services / Frequency Ages 19 to 39  Blood  pressure check.** / Every 1 to 2 years.  Lipid and cholesterol check.** / Every 5 years beginning at age 20.  Clinical breast exam.** / Every 3 years for women in their 20s and 30s.  Pap test.** / Every 2 years from ages 21 through 29. Every 3 years starting at age 30 through age 65 or 70 with a history of 3 consecutive normal Pap tests.  HPV screening.** / Every 3 years from ages 30 through ages 65 to 70 with a history of 3 consecutive normal Pap tests.  Hepatitis C blood test.** / For any individual with known risks for hepatitis C.  Skin self-exam. / Monthly.  Influenza immunization.** / Every year.  Pneumococcal polysaccharide immunization.** / 1 to 2 doses if you smoke cigarettes or if you have certain chronic medical conditions.  Tetanus, diphtheria, pertussis (Tdap, Td) immunization. / A one-time dose of Tdap vaccine. After that, you need a Td booster dose every 10 years.  HPV immunization. / 3 doses over 6 months, if you are 26 and younger.  Measles, mumps, rubella (MMR) immunization. / You need at least 1 dose of MMR if you were born in 1957 or later. You may also need a second dose.  Meningococcal immunization. / 1 dose if you are age 19 to 21 and a first-year college student living in a residence hall, or have one of several medical conditions, you need to get vaccinated against meningococcal disease. You may also need additional booster doses.  Varicella immunization.** / Consult your caregiver.  Hepatitis A immunization.** / Consult your caregiver. 2 doses, 6 to 18 months apart.  Hepatitis B immunization.** / Consult your caregiver. 3 doses usually over 6 months. Ages 40 to 64  Blood pressure check.** / Every 1 to 2 years.  Lipid and cholesterol check.** / Every 5 years beginning at age 20.  Clinical breast exam.** / Every year after age 40.  Mammogram.** / Every year beginning at age 40   and continuing for as long as you are in good health. Consult with your  caregiver.  Pap test.** / Every 3 years starting at age 30 through age 65 or 70 with a history of 3 consecutive normal Pap tests.  HPV screening.** / Every 3 years from ages 30 through ages 65 to 70 with a history of 3 consecutive normal Pap tests.  Fecal occult blood test (FOBT) of stool. / Every year beginning at age 50 and continuing until age 75. You may not need to do this test if you get a colonoscopy every 10 years.  Flexible sigmoidoscopy or colonoscopy.** / Every 5 years for a flexible sigmoidoscopy or every 10 years for a colonoscopy beginning at age 50 and continuing until age 75.  Hepatitis C blood test.** / For all people born from 1945 through 1965 and any individual with known risks for hepatitis C.  Skin self-exam. / Monthly.  Influenza immunization.** / Every year.  Pneumococcal polysaccharide immunization.** / 1 to 2 doses if you smoke cigarettes or if you have certain chronic medical conditions.  Tetanus, diphtheria, pertussis (Tdap, Td) immunization.** / A one-time dose of Tdap vaccine. After that, you need a Td booster dose every 10 years.  Measles, mumps, rubella (MMR) immunization. / You need at least 1 dose of MMR if you were born in 1957 or later. You may also need a second dose.  Varicella immunization.** / Consult your caregiver.  Meningococcal immunization.** / Consult your caregiver.  Hepatitis A immunization.** / Consult your caregiver. 2 doses, 6 to 18 months apart.  Hepatitis B immunization.** / Consult your caregiver. 3 doses, usually over 6 months. Ages 65 and over  Blood pressure check.** / Every 1 to 2 years.  Lipid and cholesterol check.** / Every 5 years beginning at age 20.  Clinical breast exam.** / Every year after age 40.  Mammogram.** / Every year beginning at age 40 and continuing for as long as you are in good health. Consult with your caregiver.  Pap test.** / Every 3 years starting at age 30 through age 65 or 70 with a 3  consecutive normal Pap tests. Testing can be stopped between 65 and 70 with 3 consecutive normal Pap tests and no abnormal Pap or HPV tests in the past 10 years.  HPV screening.** / Every 3 years from ages 30 through ages 65 or 70 with a history of 3 consecutive normal Pap tests. Testing can be stopped between 65 and 70 with 3 consecutive normal Pap tests and no abnormal Pap or HPV tests in the past 10 years.  Fecal occult blood test (FOBT) of stool. / Every year beginning at age 50 and continuing until age 75. You may not need to do this test if you get a colonoscopy every 10 years.  Flexible sigmoidoscopy or colonoscopy.** / Every 5 years for a flexible sigmoidoscopy or every 10 years for a colonoscopy beginning at age 50 and continuing until age 75.  Hepatitis C blood test.** / For all people born from 1945 through 1965 and any individual with known risks for hepatitis C.  Osteoporosis screening.** / A one-time screening for women ages 65 and over and women at risk for fractures or osteoporosis.  Skin self-exam. / Monthly.  Influenza immunization.** / Every year.  Pneumococcal polysaccharide immunization.** / 1 dose at age 65 (or older) if you have never been vaccinated.  Tetanus, diphtheria, pertussis (Tdap, Td) immunization. / A one-time dose of Tdap vaccine if you are over   65 and have contact with an infant, are a healthcare worker, or simply want to be protected from whooping cough. After that, you need a Td booster dose every 10 years.  Varicella immunization.** / Consult your caregiver.  Meningococcal immunization.** / Consult your caregiver.  Hepatitis A immunization.** / Consult your caregiver. 2 doses, 6 to 18 months apart.  Hepatitis B immunization.** / Check with your caregiver. 3 doses, usually over 6 months. ** Family history and personal history of risk and conditions may change your caregiver's recommendations. Document Released: 05/21/2001 Document Revised: 06/17/2011  Document Reviewed: 08/20/2010 [employer] Patient Information 2013 [employer].  

## 2012-06-18 NOTE — Progress Notes (Signed)
  Subjective:    Patient ID: Brittany [name], female    DOB: [birth year], 67 y.o.   MRN: [id number]  HPI    Review of Systems     Objective:   Physical Exam   Chest/neck----+ supraclavicular fullness---- xray neg, check MRI     Assessment & Plan:

## 2012-06-18 NOTE — Assessment & Plan Note (Signed)
Xray done by pain management Mri suggested and ordered

## 2012-06-18 NOTE — Assessment & Plan Note (Signed)
con't meds  Check labs 

## 2012-06-19 ENCOUNTER — Telehealth: Payer: Self-pay | Admitting: Family Medicine

## 2012-06-19 ENCOUNTER — Telehealth: Payer: Self-pay

## 2012-06-19 DIAGNOSIS — R222 Localized swelling, mass and lump, trunk: Secondary | ICD-10-CM

## 2012-06-19 NOTE — Telephone Encounter (Signed)
Form completed and faxed    KP

## 2012-06-19 NOTE — Telephone Encounter (Signed)
TO MD for review.      KP 

## 2012-06-19 NOTE — Telephone Encounter (Signed)
Per my call to [facility] [employer], for a CT Neck with contrast, Case # [id number], I was unable to get approved at the clinical level.  This case has been forwarded to their medical reviewer department.  There is a 2 business day turn around for a decision.  I will inform patient of status.

## 2012-06-19 NOTE — Telephone Encounter (Signed)
CT of the neck with contrast entered per Dr.Lowne-- KP

## 2012-06-19 NOTE — Telephone Encounter (Signed)
Pt states her glucose meter is called [employer].

## 2012-06-19 NOTE — Telephone Encounter (Signed)
Message copied by Arnette Norris on Fri Jun 19, 2012  1:38 PM ------      Message from: Lelon Perla      Created: Fri Jun 19, 2012  1:27 PM      Regarding: FW: MRI ORDER CHANGE                   ----- Message -----         From: Duaine Dredge         Sent: 06/19/2012  12:09 PM           To: Lelon Perla, DO      Subject: MRI ORDER CHANGE                                         Dr. Laury Axon also for this patient,            Per radiology please remove the current order of mri chest with, and change to mri chest with & without.  The radiologist did tell me with, however radiology states anytime an mri is with contrast, it is also always done without.            Thank you :) ------

## 2012-06-23 ENCOUNTER — Telehealth: Payer: Self-pay | Admitting: Family Medicine

## 2012-06-23 MED ORDER — ESTRADIOL 1 MG PO TABS: 1.0000 mg | ORAL_TABLET | Freq: Every day | ORAL | Status: DC

## 2012-06-23 NOTE — Telephone Encounter (Signed)
Patient called requesting rx for estradiol 1 mg. Pt would like this changed to 90 day supply. Pt uses [facility] on [address]

## 2012-06-23 NOTE — Telephone Encounter (Signed)
Discuss with patient  

## 2012-06-23 NOTE — Telephone Encounter (Signed)
Per my call to to [facility] [employer], this case has been denied.  Please advise.

## 2012-06-23 NOTE — Telephone Encounter (Signed)
Please inform pt-- we can refer to ENT if she would like a specialist to evaluate it

## 2012-07-14 ENCOUNTER — Other Ambulatory Visit: Payer: Self-pay | Admitting: Otolaryngology

## 2012-07-14 DIAGNOSIS — R221 Localized swelling, mass and lump, neck: Secondary | ICD-10-CM

## 2012-07-21 ENCOUNTER — Ambulatory Visit
Admission: RE | Admit: 2012-07-21 | Discharge: 2012-07-21 | Disposition: A | Discharge status: Home / Self Care | Payer: Medicare Other | Source: Ambulatory Visit | Attending: Otolaryngology | Admitting: Otolaryngology

## 2012-07-21 DIAGNOSIS — R221 Localized swelling, mass and lump, neck: Secondary | ICD-10-CM

## 2012-07-21 MED ORDER — GADOBENATE DIMEGLUMINE 529 MG/ML IV SOLN
17.0000 mL | Freq: Once | INTRAVENOUS | Status: AC | PRN
  Administered 2012-07-21: 17 mL via INTRAVENOUS

## 2012-08-19 ENCOUNTER — Telehealth: Payer: Self-pay | Admitting: Family Medicine

## 2012-08-19 NOTE — Telephone Encounter (Signed)
noted 

## 2012-08-19 NOTE — Telephone Encounter (Signed)
Pt c/o swelling in feet and legs x3 weeks. Pt denies any redness, pain or warmness to touch. Pt indicated that when she elevated legs swelling improves but once she get back up on her feet the swelling returns. Pt schedule for OV on 08-20-12 at 1:45 pm Pt offer earlier appt day and time. Pt decline stating that she only wanted to see Dr Laury Axon.

## 2012-08-19 NOTE — Telephone Encounter (Signed)
Patient states she heard that diuretics would help the swelling in her feet and legs. She would like to know if we can prescribe something for her or if she would need an OV.

## 2012-08-20 ENCOUNTER — Ambulatory Visit (INDEPENDENT_AMBULATORY_CARE_PROVIDER_SITE_OTHER): Payer: Medicare Other | Admitting: Family Medicine

## 2012-08-20 ENCOUNTER — Encounter: Payer: Self-pay | Admitting: Family Medicine

## 2012-08-20 VITALS — BP 112/68 | HR 69 | Temp 98.9°F | Wt 190.8 lb

## 2012-08-20 DIAGNOSIS — I10 Essential (primary) hypertension: Secondary | ICD-10-CM

## 2012-08-20 DIAGNOSIS — R609 Edema, unspecified: Secondary | ICD-10-CM

## 2012-08-20 LAB — BASIC METABOLIC PANEL
BUN: 15 mg/dL (ref 6–23)
Creatinine, Ser: 0.6 mg/dL (ref 0.4–1.2)
GFR: 99.17 mL/min (ref >60.00)
Potassium: 3.8 mEq/L (ref 3.5–5.1)

## 2012-08-20 MED ORDER — HYDROCHLOROTHIAZIDE 12.5 MG PO CAPS: ORAL_CAPSULE | ORAL | Status: DC

## 2012-08-20 NOTE — Progress Notes (Signed)
  Subjective:    Brittany [name] is a 67 y.o. female who presents for evaluation of edema in both lower legs. The edema has been mild. Onset of symptoms was a few weeks ago, and patient reports symptoms have gradually worsened since that time. The edema is present all day. The patient states the problem is new. The swelling has been aggravated by dependency of involved area and hot weather. The swelling has been relieved by elevation of involved area. Associated factors include: nothing. Cardiac risk factors: diabetes mellitus, hypertension and sedentary lifestyle.  The following portions of the patient's history were reviewed and updated as appropriate: allergies, current medications, past family history, past medical history, past social history, past surgical history and problem list.  Review of Systems Pertinent items are noted in HPI.   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    Plan:    Recommendations: decrease sodium in the diet, elevate feet above the level of the heart whenever possible, increase physical activity and hctz 12.5  1 po qd. The patient was also instructed to call IMMEDIATELY (i.e., day or night) if any cardiopulmonary symptoms occur, especially chest pain, shortness of breath, dyspnea on exertion, paroxysmal nocturnal dyspnea, or orthopnea, and these were explained. Follow up in 2 weeks and as needed.

## 2012-08-20 NOTE — Patient Instructions (Signed)
Edema Edema is an abnormal build-up of fluids in tissues. Because this is partly dependent on gravity (water flows to the lowest place), it is more common in the legs and thighs (lower extremities). It is also common in the looser tissues, like around the eyes. Painless swelling of the feet and ankles is common and increases as a person ages. It may affect both legs and may include the calves or even thighs. When squeezed, the fluid may move out of the affected area and may leave a dent for a few moments. CAUSES   Prolonged standing or sitting in one place for extended periods of time. Movement helps pump tissue fluid into the veins, and absence of movement prevents this, resulting in edema.  Varicose veins. The valves in the veins do not work as well as they should. This causes fluid to leak into the tissues.  Fluid and salt overload.  Injury, burn, or surgery to the leg, ankle, or foot, may damage veins and allow fluid to leak out.  Sunburn damages vessels. Leaky vessels allow fluid to go out into the sunburned tissues.  Allergies (from insect bites or stings, medications or chemicals) cause swelling by allowing vessels to become leaky.  Protein in the blood helps keep fluid in your vessels. Low protein, as in malnutrition, allows fluid to leak out.  Hormonal changes, including pregnancy and menstruation, cause fluid retention. This fluid may leak out of vessels and cause edema.  Medications that cause fluid retention. Examples are sex hormones, blood pressure medications, steroid treatment, or anti-depressants.  Some illnesses cause edema, especially heart failure, kidney disease, or liver disease.  Surgery that cuts veins or lymph nodes, such as surgery done for the heart or for breast cancer, may result in edema. DIAGNOSIS  Your caregiver is usually easily able to determine what is causing your swelling (edema) by simply asking what is wrong (getting a history) and examining you (doing  a physical). Sometimes x-rays, EKG (electrocardiogram or heart tracing), and blood work may be done to evaluate for underlying medical illness. TREATMENT  General treatment includes:  Leg elevation (or elevation of the affected body part).  Restriction of fluid intake.  Prevention of fluid overload.  Compression of the affected body part. Compression with elastic bandages or support stockings squeezes the tissues, preventing fluid from entering and forcing it back into the blood vessels.  Diuretics (also called water pills or fluid pills) pull fluid out of your body in the form of increased urination. These are effective in reducing the swelling, but can have side effects and must be used only under your caregiver's supervision. Diuretics are appropriate only for some types of edema. The specific treatment can be directed at any underlying causes discovered. Heart, liver, or kidney disease should be treated appropriately. HOME CARE INSTRUCTIONS   Elevate the legs (or affected body part) above the level of the heart, while lying down.  Avoid sitting or standing still for prolonged periods of time.  Avoid putting anything directly under the knees when lying down, and do not wear constricting clothing or garters on the upper legs.  Exercising the legs causes the fluid to work back into the veins and lymphatic channels. This may help the swelling go down.  The pressure applied by elastic bandages or support stockings can help reduce ankle swelling.  A low-salt diet may help reduce fluid retention and decrease the ankle swelling.  Take any medications exactly as prescribed. SEEK MEDICAL CARE IF:  Your edema is   not responding to recommended treatments. SEEK IMMEDIATE MEDICAL CARE IF:   You develop shortness of breath or chest pain.  You cannot breathe when you lay down; or if, while lying down, you have to get up and go to the window to get your breath.  You are having increasing  swelling without relief from treatment.  You develop a fever over 102 F (38.9 C).  You develop pain or redness in the areas that are swollen.  Tell your caregiver right away if you have gained 3 lb/1.4 kg in 1 day or 5 lb/2.3 kg in a week. MAKE SURE YOU:   Understand these instructions.  Will watch your condition.  Will get help right away if you are not doing well or get worse. Document Released: 03/25/2005 Document Revised: 09/24/2011 Document Reviewed: 11/11/2007 [employer] Patient Information 2013 [employer].  

## 2012-08-24 ENCOUNTER — Ambulatory Visit: Payer: Medicare Other | Admitting: Family Medicine

## 2012-09-02 ENCOUNTER — Encounter: Payer: Self-pay | Admitting: Lab

## 2012-09-03 ENCOUNTER — Ambulatory Visit (INDEPENDENT_AMBULATORY_CARE_PROVIDER_SITE_OTHER): Payer: Medicare Other | Admitting: Family Medicine

## 2012-09-03 ENCOUNTER — Encounter: Payer: Self-pay | Admitting: Family Medicine

## 2012-09-03 ENCOUNTER — Ambulatory Visit (HOSPITAL_BASED_OUTPATIENT_CLINIC_OR_DEPARTMENT_OTHER)
Admission: RE | Admit: 2012-09-03 | Discharge: 2012-09-03 | Disposition: A | Discharge status: Home / Self Care | Payer: Medicare Other | Source: Ambulatory Visit | Attending: Family Medicine | Admitting: Family Medicine

## 2012-09-03 VITALS — BP 110/60 | HR 61 | Temp 98.4°F | Wt 187.4 lb

## 2012-09-03 DIAGNOSIS — K0889 Other specified disorders of teeth and supporting structures: Secondary | ICD-10-CM

## 2012-09-03 DIAGNOSIS — E1142 Type 2 diabetes mellitus with diabetic polyneuropathy: Secondary | ICD-10-CM | POA: Insufficient documentation

## 2012-09-03 DIAGNOSIS — E1149 Type 2 diabetes mellitus with other diabetic neurological complication: Secondary | ICD-10-CM | POA: Insufficient documentation

## 2012-09-03 DIAGNOSIS — R609 Edema, unspecified: Secondary | ICD-10-CM | POA: Insufficient documentation

## 2012-09-03 DIAGNOSIS — M79669 Pain in unspecified lower leg: Secondary | ICD-10-CM

## 2012-09-03 DIAGNOSIS — E876 Hypokalemia: Secondary | ICD-10-CM

## 2012-09-03 DIAGNOSIS — M7989 Other specified soft tissue disorders: Secondary | ICD-10-CM | POA: Insufficient documentation

## 2012-09-03 DIAGNOSIS — K089 Disorder of teeth and supporting structures, unspecified: Secondary | ICD-10-CM

## 2012-09-03 DIAGNOSIS — M79609 Pain in unspecified limb: Secondary | ICD-10-CM

## 2012-09-03 LAB — BASIC METABOLIC PANEL
CO2: 32 mEq/L (ref 19–32)
Calcium: 9.3 mg/dL (ref 8.4–10.5)
GFR: 86.55 mL/min (ref >60.00)
Sodium: 139 mEq/L (ref 135–145)

## 2012-09-03 MED ORDER — FUROSEMIDE 20 MG PO TABS: 20.0000 mg | ORAL_TABLET | Freq: Every day | ORAL | Status: DC

## 2012-09-03 MED ORDER — AMOXICILLIN-POT CLAVULANATE 875-125 MG PO TABS: 1.0000 | ORAL_TABLET | Freq: Two times a day (BID) | ORAL | Status: DC

## 2012-09-03 NOTE — Progress Notes (Signed)
  Subjective:    Brittany [name] is a 67 y.o. female who presents for evaluation of edema in both lower legs. The edema has been mild. Onset of symptoms was several weeks ago, and patient reports symptoms have gradually improved since that time. The edema is present in the evening. The patient states the problem is new--since before last ov.. The swelling has been aggravated by dependency of involved area and increased salt intake. The swelling has been relieved by diuretics, elevation of involved area, low-salt diet. Associated factors include: risk for DVT calf pain with dec feeling . Cardiac risk factors: diabetes mellitus, dyslipidemia, hypertension and sedentary lifestyle.  The following portions of the patient's history were reviewed and updated as appropriate: allergies, current medications, past family history, past medical history, past social history, past surgical history and problem list.  Review of Systems Pertinent items are noted in HPI.   Objective:    BP 110/60  Pulse 61  Temp(Src) 98.4 F (36.9 C) (Oral)  Wt 187 lb 6.4 oz (85.004 kg)  BMI 28.08 kg/m2  SpO2 98% General appearance: alert, cooperative, appears stated age and no distress Neck: no adenopathy, no carotid bruit, no JVD, supple, symmetrical, trachea midline and thyroid not enlarged, symmetric, no tenderness/mass/nodules Lungs: clear to auscultation bilaterally Heart: S1, S2 normal Extremities: edema tr pitting edema b/l ---- no calf pain with palpation today,  Pt with dec sensation,  Pt had pain last week  Cardiographics ECG: not done  Imaging Chest x-ray: not indicated   Assessment:   1.  Edema .    Plan:    Recommendations: decrease sodium in the diet, elevate feet above the level of the heart whenever possible, increase physical activity and use of compression stockings.----pt prefers not to do stockings The patient was also instructed to call IMMEDIATELY (i.e., day or night) if any cardiopulmonary  symptoms occur, especially chest pain, shortness of breath, dyspnea on exertion, paroxysmal nocturnal dyspnea, or orthopnea, and these were explained. Follow up in 2 weeks and as needed.  Check doppler--- r/o dvt

## 2012-09-03 NOTE — Assessment & Plan Note (Signed)
Change to lasix 20 mg qd  Elevate low ext Watch salt Pt does not have feeling in her legs ( never has)---  She had pain last week in both legs , in calf. --check doppler

## 2012-09-03 NOTE — Assessment & Plan Note (Signed)
F/u dentist augmentin bid

## 2012-09-03 NOTE — Patient Instructions (Signed)
Edema  Edema is an abnormal build-up of fluids in tissues. Because this is partly dependent on gravity (water flows to the lowest place), it is more common in the legs and thighs (lower extremities). It is also common in the looser tissues, like around the eyes. Painless swelling of the feet and ankles is common and increases as a person ages. It may affect both legs and may include the calves or even thighs. When squeezed, the fluid may move out of the affected area and may leave a dent for a few moments.  CAUSES    Prolonged standing or sitting in one place for extended periods of time. Movement helps pump tissue fluid into the veins, and absence of movement prevents this, resulting in edema.   Varicose veins. The valves in the veins do not work as well as they should. This causes fluid to leak into the tissues.   Fluid and salt overload.   Injury, burn, or surgery to the leg, ankle, or foot, may damage veins and allow fluid to leak out.   Sunburn damages vessels. Leaky vessels allow fluid to go out into the sunburned tissues.   Allergies (from insect bites or stings, medications or chemicals) cause swelling by allowing vessels to become leaky.   Protein in the blood helps keep fluid in your vessels. Low protein, as in malnutrition, allows fluid to leak out.   Hormonal changes, including pregnancy and menstruation, cause fluid retention. This fluid may leak out of vessels and cause edema.   Medications that cause fluid retention. Examples are sex hormones, blood pressure medications, steroid treatment, or anti-depressants.   Some illnesses cause edema, especially heart failure, kidney disease, or liver disease.   Surgery that cuts veins or lymph nodes, such as surgery done for the heart or for breast cancer, may result in edema.  DIAGNOSIS   Your caregiver is usually easily able to determine what is causing your swelling (edema) by simply asking what is wrong (getting a history) and examining you (doing  a physical). Sometimes x-rays, EKG (electrocardiogram or heart tracing), and blood work may be done to evaluate for underlying medical illness.  TREATMENT   General treatment includes:   Leg elevation (or elevation of the affected body part).   Restriction of fluid intake.   Prevention of fluid overload.   Compression of the affected body part. Compression with elastic bandages or support stockings squeezes the tissues, preventing fluid from entering and forcing it back into the blood vessels.   Diuretics (also called water pills or fluid pills) pull fluid out of your body in the form of increased urination. These are effective in reducing the swelling, but can have side effects and must be used only under your caregiver's supervision. Diuretics are appropriate only for some types of edema.  The specific treatment can be directed at any underlying causes discovered. Heart, liver, or kidney disease should be treated appropriately.  HOME CARE INSTRUCTIONS    Elevate the legs (or affected body part) above the level of the heart, while lying down.   Avoid sitting or standing still for prolonged periods of time.   Avoid putting anything directly under the knees when lying down, and do not wear constricting clothing or garters on the upper legs.   Exercising the legs causes the fluid to work back into the veins and lymphatic channels. This may help the swelling go down.   The pressure applied by elastic bandages or support stockings can help reduce ankle swelling.     A low-salt diet may help reduce fluid retention and decrease the ankle swelling.   Take any medications exactly as prescribed.  SEEK MEDICAL CARE IF:   Your edema is not responding to recommended treatments.  SEEK IMMEDIATE MEDICAL CARE IF:    You develop shortness of breath or chest pain.   You cannot breathe when you lay down; or if, while lying down, you have to get up and go to the window to get your breath.   You are having increasing  swelling without relief from treatment.   You develop a fever over 102 F (38.9 C).   You develop pain or redness in the areas that are swollen.   Tell your caregiver right away if you have gained 3 lb/1.4 kg in 1 day or 5 lb/2.3 kg in a week.  MAKE SURE YOU:    Understand these instructions.   Will watch your condition.   Will get help right away if you are not doing well or get worse.  Document Released: 03/25/2005 Document Revised: 09/24/2011 Document Reviewed: 11/11/2007  [employer] Patient Information 2014 [employer].

## 2012-09-24 ENCOUNTER — Telehealth: Payer: Self-pay

## 2012-09-24 DIAGNOSIS — K0889 Other specified disorders of teeth and supporting structures: Secondary | ICD-10-CM

## 2012-09-24 MED ORDER — AMOXICILLIN-POT CLAVULANATE 875-125 MG PO TABS: 1.0000 | ORAL_TABLET | Freq: Two times a day (BID) | ORAL | Status: DC

## 2012-09-24 NOTE — Telephone Encounter (Signed)
Message left on triage VM, patient was seen by Dr.Lowne on 09/03/12- was given ABX, patient then seen dentist and was also given another ABX which she completed yesterday. Patient has pending appointment with dentist on 10/22/12, patient is scared that her tooth will get infected by then and would like to know if Dr.Lowne would be willing to give her another ABX, patient called dentist office and her dentist is out of the office and will return next week.   I called the patient and informed her that she will need to schedule an appointment with Dr.Lowne to further address this request. Patient refused and indicated she just needs another ABX called in. I then advised patient to contact her dentist office and have the covering Dentist for her Dentist (since he/she is out of office) advise if another ABX is necessary for she seen them more recently. Patient indicated she will take the advice of calling the dentist office back, patient instructed to call us for an appointment if needed

## 2012-09-24 NOTE — Addendum Note (Signed)
Addended by: Arnette Norris on: 09/24/2012 05:05 PM   Modules accepted: Orders

## 2012-09-24 NOTE — Telephone Encounter (Signed)
Was pt able to get dentist---I can t see original request for some reason--does she think she has an abscess?

## 2012-09-24 NOTE — Telephone Encounter (Signed)
Verbal auth to send per [facility] after I made her aware apt is not until 10/22/12.    Patient aware that Rx was sent.     KP

## 2012-09-25 ENCOUNTER — Ambulatory Visit: Payer: Medicare Other | Admitting: Family Medicine

## 2012-09-28 ENCOUNTER — Encounter: Payer: Self-pay | Admitting: Family Medicine

## 2012-10-15 ENCOUNTER — Other Ambulatory Visit: Payer: Self-pay

## 2012-11-12 ENCOUNTER — Encounter: Payer: Self-pay | Admitting: Family Medicine

## 2012-12-12 ENCOUNTER — Other Ambulatory Visit: Payer: Self-pay | Admitting: Family Medicine

## 2012-12-25 ENCOUNTER — Other Ambulatory Visit: Payer: Self-pay | Admitting: Family Medicine

## 2013-02-26 ENCOUNTER — Other Ambulatory Visit: Payer: Self-pay | Admitting: Family Medicine

## 2013-03-19 ENCOUNTER — Other Ambulatory Visit: Payer: Self-pay | Admitting: Family Medicine

## 2013-04-05 ENCOUNTER — Other Ambulatory Visit: Payer: Self-pay | Admitting: Family Medicine

## 2013-04-19 ENCOUNTER — Other Ambulatory Visit: Payer: Self-pay | Admitting: Family Medicine

## 2013-05-12 ENCOUNTER — Ambulatory Visit: Payer: Medicare Other | Admitting: Family Medicine

## 2013-05-14 ENCOUNTER — Ambulatory Visit (INDEPENDENT_AMBULATORY_CARE_PROVIDER_SITE_OTHER): Payer: Medicare HMO | Admitting: Family Medicine

## 2013-05-14 ENCOUNTER — Encounter: Payer: Self-pay | Admitting: Family Medicine

## 2013-05-14 VITALS — BP 117/63 | HR 54 | Temp 97.4°F | Wt 195.2 lb

## 2013-05-14 DIAGNOSIS — Z780 Asymptomatic menopausal state: Secondary | ICD-10-CM

## 2013-05-14 DIAGNOSIS — E785 Hyperlipidemia, unspecified: Secondary | ICD-10-CM

## 2013-05-14 DIAGNOSIS — Z23 Encounter for immunization: Secondary | ICD-10-CM

## 2013-05-14 DIAGNOSIS — N390 Urinary tract infection, site not specified: Secondary | ICD-10-CM

## 2013-05-14 DIAGNOSIS — Z1231 Encounter for screening mammogram for malignant neoplasm of breast: Secondary | ICD-10-CM

## 2013-05-14 DIAGNOSIS — I10 Essential (primary) hypertension: Secondary | ICD-10-CM

## 2013-05-14 DIAGNOSIS — E1159 Type 2 diabetes mellitus with other circulatory complications: Secondary | ICD-10-CM

## 2013-05-14 LAB — CBC WITH DIFFERENTIAL/PLATELET
BASOS ABS: 0 10*3/uL (ref 0.0–0.1)
Basophils Relative: 0.5 % (ref 0.0–3.0)
EOS ABS: 0.2 10*3/uL (ref 0.0–0.7)
Eosinophils Relative: 2.2 % (ref 0.0–5.0)
HCT: 42.7 % (ref 36.0–46.0)
Hemoglobin: 13.8 g/dL (ref 12.0–15.0)
LYMPHS PCT: 24.7 % (ref 12.0–46.0)
Lymphs Abs: 2.2 10*3/uL (ref 0.7–4.0)
MCHC: 32.2 g/dL (ref 30.0–36.0)
MCV: 86.7 fl (ref 78.0–100.0)
MONOS PCT: 6.9 % (ref 3.0–12.0)
Monocytes Absolute: 0.6 10*3/uL (ref 0.1–1.0)
NEUTROS ABS: 5.9 10*3/uL (ref 1.4–7.7)
NEUTROS PCT: 65.7 % (ref 43.0–77.0)
PLATELETS: 228 10*3/uL (ref 150.0–400.0)
RBC: 4.93 Mil/uL (ref 3.87–5.11)
RDW: 13.3 % (ref 11.5–14.6)
WBC: 9 10*3/uL (ref 4.5–10.5)

## 2013-05-14 LAB — POCT URINALYSIS DIPSTICK
BILIRUBIN UA: NEGATIVE
Glucose, UA: NEGATIVE
KETONES UA: NEGATIVE
NITRITE UA: NEGATIVE
PH UA: 5
PROTEIN UA: NEGATIVE
RBC UA: NEGATIVE
Spec Grav, UA: 1.02
Urobilinogen, UA: 0.2

## 2013-05-14 LAB — HEMOGLOBIN A1C: HEMOGLOBIN A1C: 7 % — AB (ref 4.6–6.5)

## 2013-05-14 NOTE — Patient Instructions (Signed)

## 2013-05-14 NOTE — Progress Notes (Signed)
Pre visit review using our clinic review tool, if applicable. No additional management support is needed unless otherwise documented below in the visit note. 

## 2013-05-14 NOTE — Progress Notes (Signed)
Patient ID: Brittany [name], female   DOB: 1945-10-06, 68 y.o.   MRN: [id number]       Patient ID: Brittany [name], female    DOB: 06/01/1945, 68 y.o.   MRN: [id number]    HPI HYPERTENSION  Blood pressure range-not checking   Chest pain- no      Dyspnea- no Lightheadedness- no   Edema- no Other side effects - no   Medication compliance: good Low salt diet- no  DIABETES  Blood Sugar ranges-not checking  Polyuria- no New Visual problems- no Hypoglycemic symptoms- no Other side effects-no Medication compliance - good Last eye exam- 2 years ago Foot exam- today  HYPERLIPIDEMIA  Medication compliance- good RUQ pain- no  Muscle aches- no Other side effects-no   Past Medical History  Diagnosis Date  . Hypertension   . GERD (gastroesophageal reflux disease)   . Diabetes mellitus   . Neuropathy    History   Social History  . Marital Status: Widowed    Spouse Name: N/A    Number of Children: N/A  . Years of Education: N/A   Occupational History  . Not on file.   Social History Main Topics  . Smoking status: Never Smoker   . Smokeless tobacco: Never Used  . Alcohol Use: No  . Drug Use: No  . Sexual Activity:    Partners: Male   Other Topics Concern  . Not on file   Social History Narrative   Current Outpatient Prescriptions  Medication Sig Dispense Refill  . calcium carbonate (TUMS EX) 750 MG chewable tablet Chew 1 tablet by mouth 4 (four) times daily as needed. For acid reflux    . Cholecalciferol (VITAMIN D) 1000 UNITS capsule Take 1,000 Units by mouth daily.    [name] docusate sodium (COLACE) 100 MG capsule Take 100 mg by mouth 2 (two) times daily.    [name] doxepin (SINEQUAN) 100 MG capsule Take 100 mg by mouth as needed.    . folic acid (FOLVITE) 782 MCG tablet Take 400 mcg by mouth daily.    [name] gabapentin (NEURONTIN) 800 MG tablet Take 1 tablet by mouth 4 (four) times daily.     [name] l-methylfolate-B6-B12 (METANX) 3-35-2 MG TABS Take 1 tablet by mouth daily.    [name]  loratadine (CLARITIN) 10 MG tablet Take 10 mg by mouth daily as needed. For allergies    . morphine (MS CONTIN) 60 MG 12 hr tablet Take 60 mg by mouth 3 (three) times daily.    . Multiple Vitamin (MULTIVITAMIN WITH MINERALS) TABS Take 1 tablet by mouth daily.    [name] omega-3 acid ethyl esters (LOVAZA) 1 G capsule Take 1 g by mouth daily.    [name] OVER THE COUNTER MEDICATION Take 1 tablet by mouth daily. Hair, skin, and nails vitamin    . tiZANidine (ZANAFLEX) 4 MG tablet Take 1 tablet by mouth 3 (three) times daily.     [name] amLODipine (NORVASC) 5 MG tablet TAKE ONE TABLET BY MOUTH ONCE DAILY 90 tablet 1  . aspirin 81 MG tablet Take 81 mg by mouth daily.    [name] atorvastatin (LIPITOR) 10 MG tablet Take 1 tablet (10 mg total) by mouth daily. Labs are due now 90 tablet 0  . bisoprolol-hydrochlorothiazide (ZIAC) 10-6.25 MG per tablet Take 1 tablet by mouth daily. 90 tablet 1  . escitalopram (LEXAPRO) 10 MG tablet Take 1 tablet (10 mg total) by mouth daily. 90 tablet 1  . estradiol (ESTRACE) 1 MG tablet TAKE ONE  TABLET BY MOUTH ONCE DAILY 90 tablet 1  . furosemide (LASIX) 20 MG tablet TAKE ONE TABLET BY MOUTH ONCE DAILY 90 tablet 1  . glucose blood (ONE TOUCH ULTRA TEST) test strip Check Blood sugar once daily 100 each 12  . metFORMIN (GLUCOPHAGE-XR) 500 MG 24 hr tablet Take 1 tablet in the morning and 2 in the evening-- Labs are due now 270 tablet 0  . omeprazole (PRILOSEC) 40 MG capsule Take 1 capsule (40 mg total) by mouth daily. 90 capsule 1  . ONETOUCH DELICA LANCETS 60Y MISC Check blood sugar once daily 100 each 12   No current facility-administered medications for this visit.             Objective:    BP 117/63  Pulse 54  Temp(Src) 97.4 F (36.3 C) (Oral)  Wt 195 lb 3.2 oz (88.542 kg)  SpO2 97% General appearance: alert, cooperative and appears stated age Head: Normocephalic, without obvious abnormality, atraumatic Eyes: negative findings: conjunctivae and sclerae normal and pupils equal,  round, reactive to light and accomodation Ears: normal TM's and external ear canals both ears Nose: Nares normal. Septum midline. Mucosa normal. No drainage or sinus tenderness. Throat: lips, mucosa, and tongue normal; teeth and gums normal Neck: no adenopathy, no carotid bruit, no JVD, supple, symmetrical, trachea midline and thyroid not enlarged, symmetric, no tenderness/mass/nodules Lungs: clear to auscultation bilaterally Extremities: extremities normal, atraumatic, no cyanosis or edema Sensory exam of the foot is normal, tested with the monofilament. Good pulses, no lesions or ulcers, good peripheral pulses.        Assessment & Plan:  1. Type II or unspecified type diabetes mellitus with peripheral circulatory disorders, uncontrolled(250.72) Check labs  Lab Results  Component Value Date   HGBA1C 6.4 06/18/2012    - Basic metabolic panel - CBC with Differential - Hemoglobin A1c - POCT urinalysis dipstick  2. HTN (hypertension) Stable bmp  - POCT urinalysis dipstick  3. Other and unspecified hyperlipidemia . Lipid Panel     Component Value Date/Time   CHOL 147 06/18/2012 1026   TRIG 63.0 06/18/2012 1026   HDL 64.80 06/18/2012 1026   CHOLHDL 2 06/18/2012 1026   VLDL 12.6 06/18/2012 1026   LDLCALC 70 06/18/2012 1026     - Hepatic function panel - Lipid panel - POCT urinalysis dipstick  4. Postmenopausal con't d and calcium - DG Bone Density; Future  5. Other screening mammogram due - MM DIGITAL SCREENING BILATERAL; Future

## 2013-05-17 ENCOUNTER — Telehealth: Payer: Self-pay | Admitting: Family Medicine

## 2013-05-17 LAB — LIPID PANEL
CHOL/HDL RATIO: 2
Cholesterol: 160 mg/dL (ref 0–200)
HDL: 69.8 mg/dL (ref >39.00)
LDL Cholesterol: 68 mg/dL (ref 0–99)
TRIGLYCERIDES: 113 mg/dL (ref 0.0–149.0)
VLDL: 22.6 mg/dL (ref 0.0–40.0)

## 2013-05-17 LAB — BASIC METABOLIC PANEL
BUN: 15 mg/dL (ref 6–23)
CALCIUM: 9 mg/dL (ref 8.4–10.5)
CO2: 32 meq/L (ref 19–32)
CREATININE: 0.7 mg/dL (ref 0.4–1.2)
Chloride: 99 mEq/L (ref 96–112)
GFR: 86.36 mL/min (ref >60.00)
GLUCOSE: 103 mg/dL — AB (ref 70–99)
Potassium: 4 mEq/L (ref 3.5–5.1)
Sodium: 139 mEq/L (ref 135–145)

## 2013-05-17 LAB — HEPATIC FUNCTION PANEL
ALT: 22 U/L (ref 0–35)
AST: 21 U/L (ref 0–37)
Albumin: 3.9 g/dL (ref 3.5–5.2)
Alkaline Phosphatase: 52 U/L (ref 39–117)
BILIRUBIN DIRECT: 0 mg/dL (ref 0.0–0.3)
TOTAL PROTEIN: 7.1 g/dL (ref 6.0–8.3)
Total Bilirubin: 0.6 mg/dL (ref 0.3–1.2)

## 2013-05-17 LAB — URINE CULTURE: Colony Count: 9000

## 2013-05-17 NOTE — Telephone Encounter (Signed)
Relevant patient education mailed to patient.  

## 2013-05-17 NOTE — Progress Notes (Addendum)
Quick Note:  hgba1c elevated---- metformin xr 500 mg 1 po qam and 2 po qpm  #90 3 refills 250.01 Bmp, lipid, hep, hgba1c, ______

## 2013-05-20 ENCOUNTER — Telehealth: Payer: Self-pay | Admitting: *Deleted

## 2013-05-20 ENCOUNTER — Other Ambulatory Visit: Payer: Self-pay | Admitting: Family Medicine

## 2013-05-20 ENCOUNTER — Encounter: Payer: Self-pay | Admitting: Family Medicine

## 2013-05-20 NOTE — Telephone Encounter (Signed)
Patient called to return your phone call back.  °

## 2013-05-21 MED ORDER — METFORMIN HCL ER 500 MG PO TB24: ORAL_TABLET | ORAL | Status: DC

## 2013-05-21 MED ORDER — AMLODIPINE BESYLATE 5 MG PO TABS: ORAL_TABLET | ORAL | Status: DC

## 2013-05-21 MED ORDER — ATORVASTATIN CALCIUM 10 MG PO TABS: 10.0000 mg | ORAL_TABLET | Freq: Every day | ORAL | Status: DC

## 2013-05-21 NOTE — Telephone Encounter (Signed)
hgba1c elevated---- metformin xr 500 mg 1 po qam and 2 po qpm #90 3  refills 250.01 Bmp, lipid, hep, hgba1c,

## 2013-05-21 NOTE — Telephone Encounter (Signed)
Patient aware and voiced understanding. New Rx.      KP

## 2013-05-21 NOTE — Telephone Encounter (Signed)
MSG left to call the office      KP 

## 2013-06-04 ENCOUNTER — Other Ambulatory Visit: Payer: Self-pay | Admitting: Family Medicine

## 2013-06-18 ENCOUNTER — Other Ambulatory Visit: Payer: Self-pay | Admitting: Family Medicine

## 2013-06-21 ENCOUNTER — Other Ambulatory Visit: Payer: Self-pay | Admitting: Family Medicine

## 2013-07-01 ENCOUNTER — Telehealth: Payer: Self-pay

## 2013-07-01 NOTE — Telephone Encounter (Signed)
Left message for call back Non-identifiable   Pap- 08/11/08-negative; hx. hysterectomy CCS- 06/12/10- adenomatous polyp, external hemorrhoids MMG- 02/26/12- negative BD- 07/21/09-normal Flu- 05/14/13 Td PNA- 05/14/13 Z- 01/02/10

## 2013-07-02 ENCOUNTER — Encounter: Payer: Self-pay | Admitting: Family Medicine

## 2013-07-02 ENCOUNTER — Ambulatory Visit (INDEPENDENT_AMBULATORY_CARE_PROVIDER_SITE_OTHER): Payer: Medicare HMO | Admitting: Family Medicine

## 2013-07-02 VITALS — BP 110/70 | HR 89 | Temp 98.6°F | Ht 69.0 in | Wt 188.6 lb

## 2013-07-02 DIAGNOSIS — E2839 Other primary ovarian failure: Secondary | ICD-10-CM

## 2013-07-02 DIAGNOSIS — E785 Hyperlipidemia, unspecified: Secondary | ICD-10-CM

## 2013-07-02 DIAGNOSIS — Z0000 Encounter for general adult medical examination without abnormal findings: Secondary | ICD-10-CM

## 2013-07-02 DIAGNOSIS — I10 Essential (primary) hypertension: Secondary | ICD-10-CM

## 2013-07-02 DIAGNOSIS — E119 Type 2 diabetes mellitus without complications: Secondary | ICD-10-CM

## 2013-07-02 DIAGNOSIS — IMO0002 Reserved for concepts with insufficient information to code with codable children: Secondary | ICD-10-CM

## 2013-07-02 DIAGNOSIS — Z1239 Encounter for other screening for malignant neoplasm of breast: Secondary | ICD-10-CM

## 2013-07-02 NOTE — Patient Instructions (Signed)

## 2013-07-02 NOTE — Assessment & Plan Note (Signed)
Check labs con't meds and accu check

## 2013-07-02 NOTE — Progress Notes (Signed)
Subjective:    Brittany [name] is a 68 y.o. female who presents for a welcome to Medicare exam.   Cardiac risk factors: advanced age (older than 75 for men, 22 for women), diabetes mellitus, dyslipidemia, hypertension and sedentary lifestyle.  Activities of Daily Living  In your present state of health, do you have any difficulty performing the following activities?:  Preparing food and eating?: No Bathing yourself: No Getting dressed: No Using the toilet:No Moving around from place to place: No In the past year have you fallen or had a near fall?:No  Current exercise habits: walking   Dietary issues discussed: na   Depression Screen (Note: if answer to either of the following is "Yes", then a more complete depression screening is indicated)  Q1: Over the past two weeks, have you felt down, depressed or hopeless?no Q2: Over the past two weeks, have you felt little interest or pleasure in doing things? no   The following portions of the patient's history were reviewed and updated as appropriate:  She  has a past medical history of Hypertension; GERD (gastroesophageal reflux disease); Diabetes mellitus; and Neuropathy. She  does not have any pertinent problems on file. She  has past surgical history that includes Lumbar fusion. Her family history includes Cancer in her mother; Coronary artery disease in an other family member; Depression in her mother; Diabetes in her brother and another family member; Heart disease (age of onset: 14) in her father; Hyperlipidemia in her brother; Hypertension in her brother; Kidney disease in her brother; Stroke in her brother. She  reports that she has never smoked. She has never used smokeless tobacco. She reports that she does not drink alcohol or use illicit drugs. She has a current medication list which includes the following prescription(s): amlodipine, aspirin, atorvastatin, bisoprolol-hydrochlorothiazide, calcium carbonate, vitamin d, docusate  sodium, doxepin, escitalopram, estradiol, folic acid, furosemide, gabapentin, l-methylfolate-b6-b12, loratadine, metformin, morphine, multivitamin with minerals, omega-3 acid ethyl esters, omeprazole, OVER THE COUNTER MEDICATION, and tizanidine. Current Outpatient Prescriptions on File Prior to Visit  Medication Sig Dispense Refill  . amLODipine (NORVASC) 5 MG tablet TAKE ONE TABLET BY MOUTH ONCE DAILY  90 tablet  1  . aspirin 325 MG tablet Take 325 mg by mouth daily.      [name] atorvastatin (LIPITOR) 10 MG tablet Take 1 tablet (10 mg total) by mouth daily.  90 tablet  1  . bisoprolol-hydrochlorothiazide (ZIAC) 10-6.25 MG per tablet TAKE ONE TABLET BY MOUTH ONCE DAILY  90 tablet  1  . calcium carbonate (TUMS EX) 750 MG chewable tablet Chew 1 tablet by mouth 4 (four) times daily as needed. For acid reflux      . Cholecalciferol (VITAMIN D) 1000 UNITS capsule Take 1,000 Units by mouth daily.      [name] docusate sodium (COLACE) 100 MG capsule Take 100 mg by mouth 2 (two) times daily.      [name] doxepin (SINEQUAN) 100 MG capsule Take 100 mg by mouth as needed.      [name] escitalopram (LEXAPRO) 10 MG tablet TAKE ONE TABLET BY MOUTH ONCE DAILY  90 tablet  1  . estradiol (ESTRACE) 1 MG tablet TAKE ONE TABLET BY MOUTH ONCE DAILY  90 tablet  1  . folic acid (FOLVITE) 604 MCG tablet Take 400 mcg by mouth daily.      . furosemide (LASIX) 20 MG tablet TAKE ONE TABLET BY MOUTH ONCE DAILY  30 tablet  2  . gabapentin (NEURONTIN) 800 MG tablet Take 1 tablet  by mouth 4 (four) times daily.       [name] l-methylfolate-B6-B12 (METANX) 3-35-2 MG TABS Take 1 tablet by mouth daily.      [name] loratadine (CLARITIN) 10 MG tablet Take 10 mg by mouth daily as needed. For allergies      . metFORMIN (GLUCOPHAGE-XR) 500 MG 24 hr tablet Take 1 tablet in the morning and 2 in the evening  270 tablet  1  . morphine (MS CONTIN) 60 MG 12 hr tablet Take 60 mg by mouth 3 (three) times daily.      . Multiple Vitamin (MULTIVITAMIN WITH MINERALS) TABS Take 1  tablet by mouth daily.      [name] omega-3 acid ethyl esters (LOVAZA) 1 G capsule Take 1 g by mouth daily.      [name] omeprazole (PRILOSEC) 40 MG capsule TAKE ONE CAPSULE BY MOUTH ONCE DAILY  90 capsule  1  . OVER THE COUNTER MEDICATION Take 1 tablet by mouth daily. Hair, skin, and nails vitamin      . tiZANidine (ZANAFLEX) 4 MG tablet Take 1 tablet by mouth 3 (three) times daily.        No current facility-administered medications on file prior to visit.   She is allergic to sulfonamide derivatives.. Review of Systems  Review of Systems  Constitutional: Negative for activity change, appetite change and fatigue.  HENT: Negative for hearing loss, congestion, tinnitus and ear discharge.   Eyes: Negative for visual disturbance (see optho q1y -- vision corrected to 20/20 with glasses).  Respiratory: Negative for cough, chest tightness and shortness of breath.   Cardiovascular: Negative for chest pain, palpitations and leg swelling.  Gastrointestinal: Negative for abdominal pain, diarrhea, constipation and abdominal distention.  Genitourinary: Negative for urgency, frequency, decreased urine volume and difficulty urinating.  Musculoskeletal: Negative for back pain, arthralgias and gait problem.  Skin: Negative for color change, pallor and rash.  Neurological: Negative for dizziness, light-headedness, numbness and headaches.  Hematological: Negative for adenopathy. Does not bruise/bleed easily.  Psychiatric/Behavioral: Negative for suicidal ideas, confusion, sleep disturbance, self-injury, dysphoric mood, decreased concentration and agitation.  Pt is able to read and write and can do all ADLs No risk for falling No abuse/ violence in home      Objective:     Vision by Snellen chart: right eye:20/50, left eye:20/50 Blood pressure 110/70, pulse 89, temperature 98.6 F (37 C), temperature source Oral, height 5\' 9"  (1.753 m), weight 188 lb 9.6 oz (85.548 kg), SpO2 98.00%. Body mass index is 27.84  kg/(m^2). BP 110/70  Pulse 89  Temp(Src) 98.6 F (37 C) (Oral)  Ht 5\' 9"  (1.753 m)  Wt 188 lb 9.6 oz (85.548 kg)  BMI 27.84 kg/m2  SpO2 98% General appearance: alert, cooperative, appears stated age and no distress Head: Normocephalic, without obvious abnormality, atraumatic Eyes: negative findings: lids and lashes normal and pupils equal, round, reactive to light and accomodation Ears: normal TM's and external ear canals both ears Nose: Nares normal. Septum midline. Mucosa normal. No drainage or sinus tenderness. Throat: lips, mucosa, and tongue normal; teeth and gums normal Neck: no adenopathy, no carotid bruit, no JVD, supple, symmetrical, trachea midline and thyroid not enlarged, symmetric, no tenderness/mass/nodules Back: symmetric, no curvature. ROM normal. No CVA tenderness. Lungs: clear to auscultation bilaterally Breasts: normal appearance, no masses or tenderness Heart: regular rate and rhythm, S1, S2 normal, no murmur, click, rub or gallop Abdomen: soft, non-tender; bowel sounds normal; no masses,  no organomegaly Pelvic: not indicated; post-menopausal, no abnormal  Pap smears in past Extremities: extremities normal, atraumatic, no cyanosis or edema Pulses: 2+ and symmetric Skin: Skin color, texture, turgor normal. No rashes or lesions Lymph nodes: Cervical, supraclavicular, and axillary nodes normal. Neurologic: Alert and oriented X 3, normal strength and tone. Normal symmetric reflexes. Normal coordination and gait Psych-- no depression, no anxiety      Assessment:   cpe     Plan:     During the course of the visit the patient was educated and counseled about appropriate screening and preventive services including:   Pneumococcal vaccine   Influenza vaccine  Td vaccine  Screening electrocardiogram  Screening mammography  Screening Pap smear and pelvic exam   Bone densitometry screening  Colorectal cancer screening  Diabetes screening  Advanced  directives: has an advanced directive - a copy HAS NOT been provided.  Patient Instructions (the written plan) was given to the patient.

## 2013-07-02 NOTE — Progress Notes (Signed)
Pre visit review using our clinic review tool, if applicable. No additional management support is needed unless otherwise documented below in the visit note. 

## 2013-07-03 ENCOUNTER — Telehealth: Payer: Self-pay | Admitting: Family Medicine

## 2013-07-03 NOTE — Telephone Encounter (Signed)
Relevant patient education mailed to patient.  

## 2013-07-06 ENCOUNTER — Telehealth: Payer: Self-pay | Admitting: *Deleted

## 2013-07-06 MED ORDER — ONETOUCH DELICA LANCETS 33G MISC: Status: DC

## 2013-07-06 MED ORDER — GLUCOSE BLOOD VI STRP: ORAL_STRIP | Status: DC

## 2013-07-06 NOTE — Telephone Encounter (Signed)
Patient called and stated that a prior authorization is needed for Estradiol 1 mg tablet. Paperwork faxed to [facility] for review. Awaiting response. JG//CMA

## 2013-07-06 NOTE — Telephone Encounter (Signed)
Patient called to make me aware she has a onetouch ultra mini meter and delica lancing device and wanted supplies.   KP

## 2013-07-07 NOTE — Telephone Encounter (Signed)
Unable to reach patient pre visit.  

## 2013-07-07 NOTE — Telephone Encounter (Signed)
Received phone call from [facility] stating that prior authorization for estradiol was approved effective 04/08/2013 through 04/07/2014. We will receive approval letter shortly. JG//CMA

## 2013-07-07 NOTE — Telephone Encounter (Signed)
Additional paperwork requested by [employer]. Forms completed and faxed back to [facility]. JG//CMA

## 2013-07-08 ENCOUNTER — Other Ambulatory Visit: Payer: Self-pay

## 2013-07-08 MED ORDER — FUROSEMIDE 20 MG PO TABS: ORAL_TABLET | ORAL | Status: DC

## 2013-07-26 NOTE — Progress Notes (Signed)
   Subjective:    Patient ID: Brittany [name], female    DOB: Aug 05, 1945, 68 y.o.   MRN: [id number]  HPI    Review of Systems     Objective:   Physical Exam        Assessment & Plan:

## 2013-07-29 ENCOUNTER — Telehealth: Payer: Self-pay

## 2013-07-29 NOTE — Telephone Encounter (Signed)
Relevant patient education mailed to patient.  

## 2013-08-06 LAB — HM DEXA SCAN

## 2013-09-13 ENCOUNTER — Encounter: Payer: Self-pay | Admitting: Family Medicine

## 2013-09-20 ENCOUNTER — Other Ambulatory Visit: Payer: Self-pay

## 2013-09-20 MED ORDER — METFORMIN HCL ER 500 MG PO TB24: ORAL_TABLET | ORAL | Status: DC

## 2013-10-18 ENCOUNTER — Other Ambulatory Visit: Payer: Self-pay

## 2013-10-18 MED ORDER — ESTRADIOL 1 MG PO TABS: ORAL_TABLET | ORAL | Status: DC

## 2013-12-10 ENCOUNTER — Other Ambulatory Visit: Payer: Self-pay | Admitting: Family Medicine

## 2013-12-14 LAB — HM DIABETES EYE EXAM

## 2013-12-30 ENCOUNTER — Other Ambulatory Visit: Payer: Self-pay

## 2013-12-30 MED ORDER — METFORMIN HCL ER 500 MG PO TB24: ORAL_TABLET | ORAL | Status: DC

## 2014-01-03 ENCOUNTER — Ambulatory Visit: Payer: Medicare HMO | Admitting: Family Medicine

## 2014-01-11 ENCOUNTER — Other Ambulatory Visit: Payer: Self-pay

## 2014-01-11 MED ORDER — ATORVASTATIN CALCIUM 10 MG PO TABS: 10.0000 mg | ORAL_TABLET | Freq: Every day | ORAL | Status: DC

## 2014-01-11 MED ORDER — AMLODIPINE BESYLATE 5 MG PO TABS: ORAL_TABLET | ORAL | Status: DC

## 2014-02-08 ENCOUNTER — Ambulatory Visit: Payer: Medicare HMO | Admitting: Family Medicine

## 2014-02-11 ENCOUNTER — Ambulatory Visit (INDEPENDENT_AMBULATORY_CARE_PROVIDER_SITE_OTHER): Payer: Medicare HMO | Admitting: Family Medicine

## 2014-02-11 ENCOUNTER — Encounter: Payer: Self-pay | Admitting: Family Medicine

## 2014-02-11 VITALS — BP 100/61 | HR 59 | Temp 98.5°F | Wt 181.8 lb

## 2014-02-11 DIAGNOSIS — Z23 Encounter for immunization: Secondary | ICD-10-CM

## 2014-02-11 DIAGNOSIS — E119 Type 2 diabetes mellitus without complications: Secondary | ICD-10-CM

## 2014-02-11 DIAGNOSIS — R8290 Unspecified abnormal findings in urine: Secondary | ICD-10-CM

## 2014-02-11 DIAGNOSIS — E785 Hyperlipidemia, unspecified: Secondary | ICD-10-CM

## 2014-02-11 LAB — LIPID PANEL
CHOL/HDL RATIO: 3
Cholesterol: 174 mg/dL (ref 0–200)
HDL: 63.4 mg/dL (ref >39.00)
LDL CALC: 91 mg/dL (ref 0–99)
NonHDL: 110.6
Triglycerides: 100 mg/dL (ref 0.0–149.0)
VLDL: 20 mg/dL (ref 0.0–40.0)

## 2014-02-11 LAB — CBC WITH DIFFERENTIAL/PLATELET
BASOS PCT: 0.3 % (ref 0.0–3.0)
Basophils Absolute: 0 10*3/uL (ref 0.0–0.1)
EOS ABS: 0.1 10*3/uL (ref 0.0–0.7)
EOS PCT: 1.4 % (ref 0.0–5.0)
HCT: 41.5 % (ref 36.0–46.0)
Hemoglobin: 13.5 g/dL (ref 12.0–15.0)
LYMPHS PCT: 24.1 % (ref 12.0–46.0)
Lymphs Abs: 2 10*3/uL (ref 0.7–4.0)
MCHC: 32.5 g/dL (ref 30.0–36.0)
MCV: 84.1 fl (ref 78.0–100.0)
Monocytes Absolute: 0.6 10*3/uL (ref 0.1–1.0)
Monocytes Relative: 6.7 % (ref 3.0–12.0)
NEUTROS PCT: 67.5 % (ref 43.0–77.0)
Neutro Abs: 5.6 10*3/uL (ref 1.4–7.7)
Platelets: 217 10*3/uL (ref 150.0–400.0)
RBC: 4.94 Mil/uL (ref 3.87–5.11)
RDW: 14 % (ref 11.5–15.5)
WBC: 8.2 10*3/uL (ref 4.0–10.5)

## 2014-02-11 LAB — BASIC METABOLIC PANEL
BUN: 16 mg/dL (ref 6–23)
CALCIUM: 9.1 mg/dL (ref 8.4–10.5)
CO2: 32 mEq/L (ref 19–32)
CREATININE: 0.8 mg/dL (ref 0.4–1.2)
Chloride: 99 mEq/L (ref 96–112)
GFR: 82.2 mL/min (ref >60.00)
Glucose, Bld: 101 mg/dL — ABNORMAL HIGH (ref 70–99)
Potassium: 3.9 mEq/L (ref 3.5–5.1)
Sodium: 140 mEq/L (ref 135–145)

## 2014-02-11 LAB — HEPATIC FUNCTION PANEL
ALT: 22 U/L (ref 0–35)
AST: 18 U/L (ref 0–37)
Albumin: 3.3 g/dL — ABNORMAL LOW (ref 3.5–5.2)
Alkaline Phosphatase: 52 U/L (ref 39–117)
BILIRUBIN DIRECT: 0 mg/dL (ref 0.0–0.3)
BILIRUBIN TOTAL: 0.5 mg/dL (ref 0.2–1.2)
Total Protein: 7.1 g/dL (ref 6.0–8.3)

## 2014-02-11 LAB — POCT URINALYSIS DIPSTICK
Bilirubin, UA: NEGATIVE
Blood, UA: NEGATIVE
Glucose, UA: NEGATIVE
Ketones, UA: NEGATIVE
Nitrite, UA: NEGATIVE
PH UA: 5
PROTEIN UA: NEGATIVE
UROBILINOGEN UA: 2

## 2014-02-11 LAB — MICROALBUMIN / CREATININE URINE RATIO
CREATININE, U: 76.2 mg/dL
MICROALB UR: 0.4 mg/dL (ref 0.0–1.9)
Microalb Creat Ratio: 0.5 mg/g (ref 0.0–30.0)

## 2014-02-11 LAB — HEMOGLOBIN A1C: Hgb A1c MFr Bld: 6.7 % — ABNORMAL HIGH (ref 4.6–6.5)

## 2014-02-11 MED ORDER — ONETOUCH DELICA LANCETS 33G MISC: Status: DC

## 2014-02-11 MED ORDER — GLUCOSE BLOOD VI STRP: ORAL_STRIP | Status: DC

## 2014-02-11 NOTE — Progress Notes (Signed)
Pre visit review using our clinic review tool, if applicable. No additional management support is needed unless otherwise documented below in the visit note. 

## 2014-02-11 NOTE — Progress Notes (Signed)
Subjective:    Patient ID: Brittany [name], female    DOB: 1945/07/03, 68 y.o.   MRN: [id number]  HPI  HPI HYPERTENSION  Blood pressure range-good  Chest pain- no      Dyspnea- no Lightheadedness- no   Edema- no Other side effects - no   Medication compliance: good Low salt diet- yes  DIABETES  Blood Sugar ranges-not checking  Polyuria- no New Visual problems- no Hypoglycemic symptoms- no Other side effects-no Medication compliance - good Last eye exam- 12/2013 Foot exam- today  HYPERLIPIDEMIA  Medication compliance- good RUQ pain- no  Muscle aches- no Other side effects-no  Past Medical History  Diagnosis Date  . Hypertension   . GERD (gastroesophageal reflux disease)   . Diabetes mellitus   . Neuropathy    History   Social History  . Marital Status: Widowed    Spouse Name: N/A    Number of Children: N/A  . Years of Education: N/A   Occupational History  . Not on file.   Social History Main Topics  . Smoking status: Never Smoker   . Smokeless tobacco: Never Used  . Alcohol Use: No  . Drug Use: No  . Sexual Activity:    Partners: Male   Other Topics Concern  . Not on file   Social History Narrative  . No narrative on file   Current Outpatient Prescriptions  Medication Sig Dispense Refill  . amLODipine (NORVASC) 5 MG tablet TAKE ONE TABLET BY MOUTH ONCE DAILY 90 tablet 1  . aspirin 81 MG tablet Take 81 mg by mouth daily.    [name] atorvastatin (LIPITOR) 10 MG tablet Take 1 tablet (10 mg total) by mouth daily. Labs are due now 90 tablet 0  . bisoprolol-hydrochlorothiazide (ZIAC) 10-6.25 MG per tablet TAKE ONE TABLET BY MOUTH ONCE DAILY 90 tablet 0  . calcium carbonate (TUMS EX) 750 MG chewable tablet Chew 1 tablet by mouth 4 (four) times daily as needed. For acid reflux    . Cholecalciferol (VITAMIN D) 1000 UNITS capsule Take 1,000 Units by mouth daily.    [name] docusate sodium (COLACE) 100 MG capsule Take 100 mg by mouth 2 (two) times daily.    [name]  doxepin (SINEQUAN) 100 MG capsule Take 100 mg by mouth as needed.    [name] escitalopram (LEXAPRO) 10 MG tablet TAKE ONE TABLET BY MOUTH ONCE DAILY 90 tablet 0  . estradiol (ESTRACE) 1 MG tablet TAKE ONE TABLET BY MOUTH ONCE DAILY 90 tablet 1  . folic acid (FOLVITE) 664 MCG tablet Take 400 mcg by mouth daily.    . furosemide (LASIX) 20 MG tablet TAKE ONE TABLET BY MOUTH ONCE DAILY 90 tablet 1  . gabapentin (NEURONTIN) 800 MG tablet Take 1 tablet by mouth 4 (four) times daily.     [name] glucose blood (ONE TOUCH ULTRA TEST) test strip Check Blood sugar once daily 100 each 12  . l-methylfolate-B6-B12 (METANX) 3-35-2 MG TABS Take 1 tablet by mouth daily.    [name] loratadine (CLARITIN) 10 MG tablet Take 10 mg by mouth daily as needed. For allergies    . metFORMIN (GLUCOPHAGE-XR) 500 MG 24 hr tablet Take 1 tablet in the morning and 2 in the evening-- Labs are due now 270 tablet 0  . morphine (MS CONTIN) 60 MG 12 hr tablet Take 60 mg by mouth 3 (three) times daily.    . Multiple Vitamin (MULTIVITAMIN WITH MINERALS) TABS Take 1 tablet by mouth daily.    [name] omega-3  acid ethyl esters (LOVAZA) 1 G capsule Take 1 g by mouth daily.    [name] omeprazole (PRILOSEC) 40 MG capsule TAKE ONE CAPSULE BY MOUTH ONCE DAILY 90 capsule 0  . ONETOUCH DELICA LANCETS 70V MISC Check blood sugar once daily 100 each 12  . OVER THE COUNTER MEDICATION Take 1 tablet by mouth daily. Hair, skin, and nails vitamin    . tiZANidine (ZANAFLEX) 4 MG tablet Take 1 tablet by mouth 3 (three) times daily.      No current facility-administered medications for this visit.       Review of Systems    as above Objective:   Physical Exam BP 100/61 mmHg  Pulse 59  Temp(Src) 98.5 F (36.9 C) (Oral)  Wt 181 lb 12.8 oz (82.464 kg)  SpO2 97% General appearance: alert, cooperative, appears stated age and no distress Neck: no adenopathy, no carotid bruit, no JVD, supple, symmetrical, trachea midline and thyroid not enlarged, symmetric, no  tenderness/mass/nodules Lungs: clear to auscultation bilaterally Heart: S1, S2 normal Extremities: extremities normal, atraumatic, no cyanosis or edema--  Pt unable to feel monofilament        Assessment & Plan:  1. Need for prophylactic vaccination and inoculation against influenza   - Flu vaccine HIGH DOSE PF (Fluzone Tri High dose)  2. Diabetes mellitus type II, controlled  Check labs, con't meds - Basic metabolic panel - CBC with Differential - Hemoglobin A1c - POCT urinalysis dipstick - Microalbumin / creatinine urine ratio - glucose blood (ONE TOUCH ULTRA TEST) test strip; Check Blood sugar once daily  Dispense: 100 each; Refill: 12 - ONETOUCH DELICA LANCETS 77L MISC; Check blood sugar once daily  Dispense: 100 each; Refill: 12  3. Hyperlipidemia Check labs, con't meds - Hepatic function panel - Lipid panel - POCT urinalysis dipstick - Microalbumin / creatinine urine ratio  4. Abnormal urine  - Urine Culture

## 2014-02-11 NOTE — Patient Instructions (Signed)
Diabetes and Standards of Medical Care Diabetes is complicated. You may find that your diabetes team includes a dietitian, nurse, diabetes educator, eye doctor, and more. To help everyone know what is going on and to help you get the care you deserve, the following schedule of care was developed to help keep you on track. Below are the tests, exams, vaccines, medicines, education, and plans you will need. HbA1c test This test shows how well you have controlled your glucose over the past 2-3 months. It is used to see if your diabetes management plan needs to be adjusted.   It is performed at least 2 times a year if you are meeting treatment goals.  It is performed 4 times a year if therapy has changed or if you are not meeting treatment goals. Blood pressure test  This test is performed at every routine medical visit. The goal is less than 140/90 mm Hg for most people, but 130/80 mm Hg in some cases. Ask your health care provider about your goal. Dental exam  Follow up with the dentist regularly. Eye exam  If you are diagnosed with type 1 diabetes as a child, get an exam upon reaching the age of 37 years or older and have had diabetes for 3-5 years. Yearly eye exams are recommended after that initial eye exam.  If you are diagnosed with type 1 diabetes as an adult, get an exam within 5 years of diagnosis and then yearly.  If you are diagnosed with type 2 diabetes, get an exam as soon as possible after the diagnosis and then yearly. Foot care exam  Visual foot exams are performed at every routine medical visit. The exams check for cuts, injuries, or other problems with the feet.  A comprehensive foot exam should be done yearly. This includes visual inspection as well as assessing foot pulses and testing for loss of sensation.  Check your feet nightly for cuts, injuries, or other problems with your feet. Tell your health care provider if anything is not healing. Kidney function test (urine  microalbumin)  This test is performed once a year.  Type 1 diabetes: The first test is performed 5 years after diagnosis.  Type 2 diabetes: The first test is performed at the time of diagnosis.  A serum creatinine and estimated glomerular filtration rate (eGFR) test is done once a year to assess the level of chronic kidney disease (CKD), if present. Lipid profile (cholesterol, HDL, LDL, triglycerides)  Performed every 5 years for most people.  The goal for LDL is less than 100 mg/dL. If you are at high risk, the goal is less than 70 mg/dL.  The goal for HDL is 40 mg/dL-50 mg/dL for men and 50 mg/dL-60 mg/dL for women. An HDL cholesterol of 60 mg/dL or higher gives some protection against heart disease.  The goal for triglycerides is less than 150 mg/dL. Influenza vaccine, pneumococcal vaccine, and hepatitis B vaccine  The influenza vaccine is recommended yearly.  It is recommended that people with diabetes who are over 24 years old get the pneumonia vaccine. In some cases, two separate shots may be given. Ask your health care provider if your pneumonia vaccination is up to date.  The hepatitis B vaccine is also recommended for adults with diabetes. Diabetes self-management education  Education is recommended at diagnosis and ongoing as needed. Treatment plan  Your treatment plan is reviewed at every medical visit. Document Released: 01/20/2009 Document Revised: 08/09/2013 Document Reviewed: 08/25/2012 [facility] Patient Information 2015 [place],  [employer]. This information is not intended to replace advice given to you by your health care provider. Make sure you discuss any questions you have with your health care provider.  

## 2014-02-12 LAB — URINE CULTURE

## 2014-03-01 ENCOUNTER — Other Ambulatory Visit: Payer: Self-pay

## 2014-03-01 MED ORDER — FUROSEMIDE 20 MG PO TABS: ORAL_TABLET | ORAL | Status: DC

## 2014-03-08 ENCOUNTER — Telehealth: Payer: Self-pay | Admitting: Family Medicine

## 2014-03-08 NOTE — Telephone Encounter (Addendum)
Caller name: Elisea, Khader Relation to pt: self  Call back number: [phone]   Reason for call: pt dropped 1 one touch ultra diabetic machine requesting another.

## 2014-03-08 NOTE — Telephone Encounter (Signed)
She can come by the office to pick one up.    KP

## 2014-03-10 NOTE — Telephone Encounter (Signed)
lvm advising pt of your instructions below

## 2014-03-25 ENCOUNTER — Other Ambulatory Visit: Payer: Self-pay

## 2014-03-25 MED ORDER — BISOPROLOL-HYDROCHLOROTHIAZIDE 10-6.25 MG PO TABS: 1.0000 | ORAL_TABLET | Freq: Every day | ORAL | Status: DC

## 2014-03-25 MED ORDER — OMEPRAZOLE 40 MG PO CPDR: 40.0000 mg | DELAYED_RELEASE_CAPSULE | Freq: Every day | ORAL | Status: DC

## 2014-03-25 MED ORDER — ESCITALOPRAM OXALATE 10 MG PO TABS: 10.0000 mg | ORAL_TABLET | Freq: Every day | ORAL | Status: DC

## 2014-06-15 ENCOUNTER — Other Ambulatory Visit: Payer: Self-pay

## 2014-06-15 MED ORDER — ESTRADIOL 1 MG PO TABS: ORAL_TABLET | ORAL | Status: DC

## 2014-06-22 ENCOUNTER — Other Ambulatory Visit: Payer: Self-pay

## 2014-06-22 MED ORDER — ATORVASTATIN CALCIUM 10 MG PO TABS: 10.0000 mg | ORAL_TABLET | Freq: Every day | ORAL | Status: DC

## 2014-07-14 ENCOUNTER — Other Ambulatory Visit: Payer: Self-pay

## 2014-07-14 MED ORDER — METFORMIN HCL ER 500 MG PO TB24: ORAL_TABLET | ORAL | Status: DC

## 2014-07-18 ENCOUNTER — Other Ambulatory Visit: Payer: Self-pay

## 2014-07-18 MED ORDER — AMLODIPINE BESYLATE 5 MG PO TABS: ORAL_TABLET | ORAL | Status: DC

## 2014-07-29 ENCOUNTER — Encounter: Payer: Self-pay | Admitting: Family Medicine

## 2014-08-12 ENCOUNTER — Ambulatory Visit: Payer: Medicare HMO | Admitting: Family Medicine

## 2014-08-25 ENCOUNTER — Ambulatory Visit: Payer: Medicare HMO | Admitting: Family Medicine

## 2014-08-26 ENCOUNTER — Other Ambulatory Visit: Payer: Self-pay

## 2014-08-26 MED ORDER — FUROSEMIDE 20 MG PO TABS: ORAL_TABLET | ORAL | Status: DC

## 2014-09-08 ENCOUNTER — Other Ambulatory Visit: Payer: Self-pay | Admitting: Family Medicine

## 2014-09-19 ENCOUNTER — Telehealth: Payer: Self-pay | Admitting: Family Medicine

## 2014-09-20 NOTE — Telephone Encounter (Signed)
appt scheduled for 09/26/14 4:15pm

## 2014-09-20 NOTE — Telephone Encounter (Signed)
Left message for patient to return my call.

## 2014-09-20 NOTE — Telephone Encounter (Signed)
Omeprazole and Lexapro refill sent to pharmacy. Pt last seen 02/2014 and advised 6 month f/u. Pt has cancelled f/u twice.  Please call pt to arrange f/u with Dr Etter Sjogren and let her know we may not be able to provider further refills until she is seen in the office.  Thanks!

## 2014-09-26 ENCOUNTER — Encounter: Payer: Self-pay | Admitting: Family Medicine

## 2014-09-26 ENCOUNTER — Ambulatory Visit (INDEPENDENT_AMBULATORY_CARE_PROVIDER_SITE_OTHER): Payer: Medicare HMO | Admitting: Family Medicine

## 2014-09-26 VITALS — BP 112/70 | HR 62 | Temp 98.4°F

## 2014-09-26 DIAGNOSIS — E785 Hyperlipidemia, unspecified: Secondary | ICD-10-CM | POA: Diagnosis not present

## 2014-09-26 DIAGNOSIS — Z1231 Encounter for screening mammogram for malignant neoplasm of breast: Secondary | ICD-10-CM | POA: Diagnosis not present

## 2014-09-26 DIAGNOSIS — I10 Essential (primary) hypertension: Secondary | ICD-10-CM

## 2014-09-26 DIAGNOSIS — E1149 Type 2 diabetes mellitus with other diabetic neurological complication: Secondary | ICD-10-CM | POA: Diagnosis not present

## 2014-09-26 DIAGNOSIS — E118 Type 2 diabetes mellitus with unspecified complications: Secondary | ICD-10-CM

## 2014-09-26 NOTE — Progress Notes (Signed)
Pre visit review using our clinic review tool, if applicable. No additional management support is needed unless otherwise documented below in the visit note. 

## 2014-09-26 NOTE — Progress Notes (Signed)
Patient ID: Brittany [name], female    DOB: [birth year]  Age: 69 y.o. MRN: [id number]    Subjective:  Subjective HPI Brittany [name] presents for f/u bp and cholesterol.  Review of Systems  Constitutional: Negative for activity change, appetite change, fatigue and unexpected weight change.  Respiratory: Negative for cough and shortness of breath.   Cardiovascular: Negative for chest pain, palpitations and leg swelling.  Psychiatric/Behavioral: Negative for behavioral problems and dysphoric mood. The patient is not nervous/anxious.     History Past Medical History  Diagnosis Date  . Hypertension   . GERD (gastroesophageal reflux disease)   . Diabetes mellitus   . Neuropathy     She has past surgical history that includes Lumbar fusion.   Her family history includes Cancer in her mother; Coronary artery disease in an other family member; Depression in her mother; Diabetes in her brother and another family member; Heart disease (age of onset: 42) in her father; Hyperlipidemia in her brother; Hypertension in her brother; Kidney disease in her brother; Stroke in her brother.She reports that she has never smoked. She has never used smokeless tobacco. She reports that she does not drink alcohol or use illicit drugs.  Current Outpatient Prescriptions on File Prior to Visit  Medication Sig Dispense Refill  . amLODipine (NORVASC) 5 MG tablet TAKE ONE TABLET BY MOUTH ONCE DAILY 90 tablet 1  . aspirin 81 MG tablet Take 81 mg by mouth daily.    [name] atorvastatin (LIPITOR) 10 MG tablet Take 1 tablet (10 mg total) by mouth daily. 90 tablet 1  . bisoprolol-hydrochlorothiazide (ZIAC) 10-6.25 MG per tablet Take 1 tablet by mouth daily. Office visit due now 30 tablet 0  . calcium carbonate (TUMS EX) 750 MG chewable tablet Chew 1 tablet by mouth 4 (four) times daily as needed. For acid reflux    . Cholecalciferol (VITAMIN D) 1000 UNITS capsule Take 1,000 Units by mouth daily.    [name] docusate sodium (COLACE)  100 MG capsule Take 100 mg by mouth 2 (two) times daily.    [name] doxepin (SINEQUAN) 100 MG capsule Take 100 mg by mouth as needed.    [name] escitalopram (LEXAPRO) 10 MG tablet TAKE ONE TABLET BY MOUTH ONCE DAILY 90 tablet 0  . estradiol (ESTRACE) 1 MG tablet TAKE ONE TABLET BY MOUTH ONCE DAILY 90 tablet 1  . folic acid (FOLVITE) 030 MCG tablet Take 400 mcg by mouth daily.    . furosemide (LASIX) 20 MG tablet TAKE ONE TABLET BY MOUTH ONCE DAILY 90 tablet 1  . gabapentin (NEURONTIN) 800 MG tablet Take 1 tablet by mouth 4 (four) times daily.     [name] glucose blood (ONE TOUCH ULTRA TEST) test strip Check Blood sugar once daily 100 each 12  . l-methylfolate-B6-B12 (METANX) 3-35-2 MG TABS Take 1 tablet by mouth daily.    [name] loratadine (CLARITIN) 10 MG tablet Take 10 mg by mouth daily as needed. For allergies    . metFORMIN (GLUCOPHAGE-XR) 500 MG 24 hr tablet Take 1 tablet in the morning and 2 in the evening 270 tablet 1  . morphine (MS CONTIN) 60 MG 12 hr tablet Take 60 mg by mouth 3 (three) times daily.    . Multiple Vitamin (MULTIVITAMIN WITH MINERALS) TABS Take 1 tablet by mouth daily.    [name] omega-3 acid ethyl esters (LOVAZA) 1 G capsule Take 1 g by mouth daily.    [name] omeprazole (PRILOSEC) 40 MG capsule TAKE ONE CAPSULE BY MOUTH ONCE DAILY  90 capsule 0  . ONETOUCH DELICA LANCETS 47M MISC Check blood sugar once daily 100 each 12  . OVER THE COUNTER MEDICATION Take 1 tablet by mouth daily. Hair, skin, and nails vitamin    . tiZANidine (ZANAFLEX) 4 MG tablet Take 1 tablet by mouth 3 (three) times daily.      No current facility-administered medications on file prior to visit.     Objective:  Objective Physical Exam  Constitutional: She is oriented to person, place, and time. She appears well-developed and well-nourished.  HENT:  Head: Normocephalic and atraumatic.  Eyes: Conjunctivae and EOM are normal.  Neck: Normal range of motion. Neck supple. No JVD present. Carotid bruit is not present. No thyromegaly  present.  Cardiovascular: Normal rate, regular rhythm and normal heart sounds.   No murmur heard. Pulmonary/Chest: Effort normal and breath sounds normal. No respiratory distress. She has no wheezes. She has no rales. She exhibits no tenderness.  Musculoskeletal: She exhibits no edema.  Neurological: She is alert and oriented to person, place, and time.  Psychiatric: She has a normal mood and affect. Her behavior is normal.   BP 112/70 mmHg  Pulse 62  Temp(Src) 98.4 F (36.9 C) (Oral)  SpO2 98% Wt Readings from Last 3 Encounters:  02/11/14 181 lb 12.8 oz (82.464 kg)  07/02/13 188 lb 9.6 oz (85.548 kg)  05/14/13 195 lb 3.2 oz (88.542 kg)     Lab Results  Component Value Date   WBC 8.2 02/11/2014   HGB 13.5 02/11/2014   HCT 41.5 02/11/2014   PLT 217.0 02/11/2014   GLUCOSE 101* 02/11/2014   CHOL 174 02/11/2014   TRIG 100.0 02/11/2014   HDL 63.40 02/11/2014   LDLCALC 91 02/11/2014   ALT 22 02/11/2014   AST 18 02/11/2014   NA 140 02/11/2014   K 3.9 02/11/2014   CL 99 02/11/2014   CREATININE 0.8 02/11/2014   BUN 16 02/11/2014   CO2 32 02/11/2014   TSH 0.46 06/18/2012   HGBA1C 6.7* 02/11/2014   MICROALBUR 0.4 02/11/2014    US Venous Img Lower Bilateral  09/03/2012   *RADIOLOGY REPORT*  Clinical Data: History of diabetes and neuropathy, now with bilateral foot swelling for 1 month, evaluate for DVT  BILATERAL LOWER EXTREMITY VENOUS DUPLEX ULTRASOUND  Technique:  Gray-scale sonography with graded compression, as well as color Doppler and duplex ultrasound, were performed to evaluate the deep venous system of both lower extremities from the level of the common femoral vein through the popliteal and proximal calf veins.  Spectral Doppler was utilized to evaluate flow at rest and with distal augmentation maneuvers.  Comparison:  None.  Findings:  Normal compressibility of bilateral common femoral, superficial femoral, and popliteal veins is demonstrated, as well as the visualized  proximal calf veins.  No filling defects to suggest DVT on grayscale or color Doppler imaging.  Doppler waveforms show normal direction of venous flow, normal respiratory phasicity and response to augmentation.  IMPRESSION: No evidence of deep vein thrombosis within either lower extremity.   Original Report Authenticated By: Jake Seats, MD     Assessment & Plan:  Plan I am having Ms. Radwan maintain her gabapentin, tiZANidine, docusate sodium, calcium carbonate, omega-3 acid ethyl esters, Vitamin D, multivitamin with minerals, loratadine, OVER THE COUNTER MEDICATION, morphine, [id number], folic acid, doxepin, aspirin, glucose blood, ONETOUCH DELICA LANCETS 00F, estradiol, atorvastatin, metFORMIN, amLODipine, furosemide, bisoprolol-hydrochlorothiazide, omeprazole, and escitalopram.  No orders of the defined types were placed in this encounter.  Problem List Items Addressed This Visit    Type II diabetes mellitus with complication    con't metformin  con't accu checks      Essential hypertension    norvasc 5 mg 1 po qd ziac rto 6 months      Relevant Orders   Basic metabolic panel   CBC with Differential/Platelet   Hepatic function panel   Lipid panel   POCT urinalysis dipstick    Other Visit Diagnoses    Encounter for screening mammogram for malignant neoplasm of breast    -  Primary    Relevant Orders    MM Digital Screening    Hyperlipemia        Relevant Orders    Hepatic function panel    Lipid panel    POCT urinalysis dipstick    Type 2 diabetes mellitus with other diabetic neurological complication        Relevant Orders    Hemoglobin A1c       Follow-up: Return in about 6 months (around 03/28/2015) for hypertension, hyperlipidemia.  Garnet Koyanagi, DO

## 2014-09-26 NOTE — Assessment & Plan Note (Signed)
con't metformin  con't accu checks

## 2014-09-26 NOTE — Patient Instructions (Signed)

## 2014-09-26 NOTE — Assessment & Plan Note (Signed)
norvasc 5 mg 1 po qd ziac rto 6 months

## 2014-10-11 ENCOUNTER — Ambulatory Visit (HOSPITAL_BASED_OUTPATIENT_CLINIC_OR_DEPARTMENT_OTHER)
Admission: RE | Admit: 2014-10-11 | Discharge: 2014-10-11 | Disposition: A | Discharge status: Home / Self Care | Payer: Medicare HMO | Source: Ambulatory Visit | Attending: Family Medicine | Admitting: Family Medicine

## 2014-10-11 ENCOUNTER — Other Ambulatory Visit (INDEPENDENT_AMBULATORY_CARE_PROVIDER_SITE_OTHER): Payer: Medicare HMO

## 2014-10-11 DIAGNOSIS — E1149 Type 2 diabetes mellitus with other diabetic neurological complication: Secondary | ICD-10-CM | POA: Diagnosis not present

## 2014-10-11 DIAGNOSIS — E785 Hyperlipidemia, unspecified: Secondary | ICD-10-CM | POA: Diagnosis not present

## 2014-10-11 DIAGNOSIS — I10 Essential (primary) hypertension: Secondary | ICD-10-CM

## 2014-10-11 DIAGNOSIS — Z1231 Encounter for screening mammogram for malignant neoplasm of breast: Secondary | ICD-10-CM | POA: Diagnosis not present

## 2014-10-11 LAB — CBC WITH DIFFERENTIAL/PLATELET
BASOS ABS: 0 10*3/uL (ref 0.0–0.1)
Basophils Relative: 0.4 % (ref 0.0–3.0)
EOS ABS: 0.1 10*3/uL (ref 0.0–0.7)
EOS PCT: 1.4 % (ref 0.0–5.0)
HCT: 38.3 % (ref 36.0–46.0)
HEMOGLOBIN: 12.8 g/dL (ref 12.0–15.0)
LYMPHS PCT: 15.7 % (ref 12.0–46.0)
Lymphs Abs: 1.4 10*3/uL (ref 0.7–4.0)
MCHC: 33.5 g/dL (ref 30.0–36.0)
MCV: 84.2 fl (ref 78.0–100.0)
MONOS PCT: 7 % (ref 3.0–12.0)
Monocytes Absolute: 0.6 10*3/uL (ref 0.1–1.0)
NEUTROS ABS: 6.7 10*3/uL (ref 1.4–7.7)
Neutrophils Relative %: 75.5 % (ref 43.0–77.0)
Platelets: 210 10*3/uL (ref 150.0–400.0)
RBC: 4.55 Mil/uL (ref 3.87–5.11)
RDW: 13.3 % (ref 11.5–15.5)
WBC: 8.9 10*3/uL (ref 4.0–10.5)

## 2014-10-11 LAB — LIPID PANEL
CHOLESTEROL: 151 mg/dL (ref 0–200)
HDL: 69 mg/dL (ref >39.00)
LDL CALC: 68 mg/dL (ref 0–99)
NonHDL: 82
TRIGLYCERIDES: 69 mg/dL (ref 0.0–149.0)
Total CHOL/HDL Ratio: 2
VLDL: 13.8 mg/dL (ref 0.0–40.0)

## 2014-10-11 LAB — HEPATIC FUNCTION PANEL
ALT: 15 U/L (ref 0–35)
AST: 16 U/L (ref 0–37)
Albumin: 3.7 g/dL (ref 3.5–5.2)
Alkaline Phosphatase: 52 U/L (ref 39–117)
BILIRUBIN DIRECT: 0.1 mg/dL (ref 0.0–0.3)
BILIRUBIN TOTAL: 0.3 mg/dL (ref 0.2–1.2)
Total Protein: 6.9 g/dL (ref 6.0–8.3)

## 2014-10-11 LAB — BASIC METABOLIC PANEL
BUN: 12 mg/dL (ref 6–23)
CO2: 30 mEq/L (ref 19–32)
CREATININE: 0.68 mg/dL (ref 0.40–1.20)
Calcium: 9.1 mg/dL (ref 8.4–10.5)
Chloride: 102 mEq/L (ref 96–112)
GFR: 91.85 mL/min (ref >60.00)
GLUCOSE: 128 mg/dL — AB (ref 70–99)
Potassium: 3.7 mEq/L (ref 3.5–5.1)
Sodium: 141 mEq/L (ref 135–145)

## 2014-10-11 LAB — HEMOGLOBIN A1C: Hgb A1c MFr Bld: 6.5 % (ref 4.6–6.5)

## 2014-10-16 ENCOUNTER — Other Ambulatory Visit: Payer: Self-pay | Admitting: Family Medicine

## 2014-10-17 ENCOUNTER — Other Ambulatory Visit: Payer: Self-pay

## 2014-10-17 MED ORDER — BISOPROLOL-HYDROCHLOROTHIAZIDE 10-6.25 MG PO TABS: 1.0000 | ORAL_TABLET | Freq: Every day | ORAL | Status: DC

## 2014-12-06 ENCOUNTER — Ambulatory Visit: Payer: Medicare HMO

## 2014-12-06 ENCOUNTER — Other Ambulatory Visit: Payer: Self-pay | Admitting: Family Medicine

## 2014-12-07 ENCOUNTER — Ambulatory Visit (INDEPENDENT_AMBULATORY_CARE_PROVIDER_SITE_OTHER): Payer: Medicare HMO

## 2014-12-07 VITALS — BP 112/62 | HR 59 | Ht 69.0 in | Wt 179.2 lb

## 2014-12-07 DIAGNOSIS — Z0000 Encounter for general adult medical examination without abnormal findings: Secondary | ICD-10-CM

## 2014-12-07 NOTE — Patient Instructions (Addendum)
Scheduled bone density scan.    Follow with Dr. Etter Sjogren as scheduled.    Make appointment with [facility]   Increase physical activity- start with walking and increase when comfortable.  Cut down on the amount of sweets eaten.     Bone Densitometry Bone densitometry is a special X-ray that measures your bone density and can be used to help predict your risk of bone fractures. This test is used to determine bone mineral content and density to diagnose osteoporosis. Osteoporosis is the loss of bone that may cause the bone to become weak. Osteoporosis commonly occurs in women entering menopause. However, it may be found in men and in people with other diseases. PREPARATION FOR TEST No preparation necessary. WHO SHOULD BE TESTED?  All women older than 48.  Postmenopausal women (50 to 58) with risk factors for osteoporosis.  People with a previous fracture caused by normal activities.  People with a small body frame (less than 127 poundsor a body mass index [BMI] of less than 21).  People who have a parent with a hip fracture or history of osteoporosis.  People who smoke.  People who have rheumatoid arthritis.  Anyone who engages in excessive alcohol use (more than 3 drinks most days).  Women who experience early menopause. WHEN SHOULD YOU BE RETESTED? Current guidelines suggest that you should wait at least 2 years before doing a bone density test again if your first test was normal.Recent studies indicated that women with normal bone density may be able to wait a few years before needing to repeat a bone density test. You should discuss this with your caregiver.  NORMAL FINDINGS   Normal: less than standard deviation below normal (greater than -1).  Osteopenia: 1 to 2.5 standard deviations below normal (-1 to -2.5).  Osteoporosis: greater than 2.5 standard deviations below normal (less than -2.5). Test results are reported as a "T score" and a "Z score."The T score is  a number that compares your bone density with the bone density of healthy, young women.The Z score is a number that compares your bone density with the scores of women who are the same age, gender, and race.  Ranges for normal findings may vary among different laboratories and hospitals. You should always check with your doctor after having lab work or other tests done to discuss the meaning of your test results and whether your values are considered within normal limits. MEANING OF TEST  Your caregiver will go over the test results with you and discuss the importance and meaning of your results, as well as treatment options and the need for additional tests if necessary. OBTAINING THE TEST RESULTS It is your responsibility to obtain your test results. Ask the lab or department performing the test when and how you will get your results. Document Released: 04/16/2004 Document Revised: 06/17/2011 Document Reviewed: 05/09/2010 [facility] Patient Information 2015 [place], [place]. This information is not intended to replace advice given to you by your health care provider. Make sure you discuss any questions you have with your health care provider.  Fall Prevention and Home Safety Falls cause injuries and can affect all age groups. It is possible to prevent falls.  HOW TO PREVENT FALLS  Wear shoes with rubber soles that do not have an opening for your toes.  Keep the inside and outside of your house well lit.  Use night lights throughout your home.  Remove clutter from floors.  Clean up floor spills.  Remove throw rugs or  fasten them to the floor with carpet tape.  Do not place electrical cords across pathways.  Put grab bars by your tub, shower, and toilet. Do not use towel bars as grab bars.  Put handrails on both sides of the stairway. Fix loose handrails.  Do not climb on stools or stepladders, if possible.  Do not wax your floors.  Repair uneven or unsafe sidewalks, walkways, or  stairs.  Keep items you use a lot within reach.  Be aware of pets.  Keep emergency numbers next to the telephone.  Put smoke detectors in your home and near bedrooms. Ask your doctor what other things you can do to prevent falls. Document Released: 01/19/2009 Document Revised: 09/24/2011 Document Reviewed: 06/25/2011 [facility] Patient Information 2015 [place], [place]. This information is not intended to replace advice given to you by your health care provider. Make sure you discuss any questions you have with your health care provider.

## 2014-12-07 NOTE — Progress Notes (Signed)
Pre visit review using our clinic review tool, if applicable. No additional management support is needed unless otherwise documented below in the visit note. 

## 2014-12-07 NOTE — Progress Notes (Addendum)
Subjective:   Brittany [name] is a 69 y.o. female who presents for Medicare Annual (Subsequent) preventive examination.  Review of Systems: NO ROS  Sleep patterns:  Goes to bed at 3 or 4:00 am.    Home Safety/Smoke Alarms:  Lives at home alone with dog.  Smoke detectors present.  Alarm system.  Feels safe at home.  Firearm Safety:  No firearm.   Seat Belt Safety/Bike Helmet:  Always wears seat belt.    Counseling:   Eye Exam- Appointment 01/10/15  Dental- Last appt-05/2014; goes every year. Female:  Pap-total hysterectomy    Mammo- 10/11/14-negative, repeat 1 year.    Dexa scan- Last 05/08/10-normal, DUE, Dexa scan discussed and ordered.     CCS- 06/12/10, repeat in 5 years (2017) Immunizations-postponed   Hep C Screening Assessment completed: Yes to Dx. DM and born between 56-1963.  Declined screening at this time.    Objective:    Vitals: BP 112/62 mmHg  Pulse 59  Ht 5\' 9"  (1.753 m)  Wt 179 lb 3.2 oz (81.285 kg)  BMI 26.45 kg/m2  SpO2 99%  Tobacco History  Smoking status  . Never Smoker   Smokeless tobacco  . Never Used     Counseling given: Yes   Past Medical History  Diagnosis Date  . Hypertension   . GERD (gastroesophageal reflux disease)   . Diabetes mellitus   . Neuropathy    Past Surgical History  Procedure Laterality Date  . Lumbar fusion     Family History  Problem Relation Age of Onset  . Coronary artery disease    . Diabetes    . Cancer Mother     breast  . Depression Mother     bipolar  . Heart disease Father 1    MI  . Diabetes Brother   . Kidney disease Brother   . Hypertension Brother   . Hyperlipidemia Brother   . Stroke Brother    History  Sexual Activity  . Sexual Activity:  . Partners: Male    Outpatient Encounter Prescriptions as of 12/07/2014  Medication Sig  . amLODipine (NORVASC) 5 MG tablet TAKE ONE TABLET BY MOUTH ONCE DAILY  . aspirin 81 MG tablet Take 81 mg by mouth daily.  [name] atorvastatin (LIPITOR) 10 MG tablet Take  1 tablet (10 mg total) by mouth daily.  . bisoprolol-hydrochlorothiazide (ZIAC) 10-6.25 MG per tablet Take 1 tablet by mouth daily.  . calcium carbonate (TUMS EX) 750 MG chewable tablet Chew 1 tablet by mouth 4 (four) times daily as needed. For acid reflux  . Cholecalciferol (VITAMIN D) 1000 UNITS capsule Take 1,000 Units by mouth daily.  [name] docusate sodium (COLACE) 100 MG capsule Take 100 mg by mouth 2 (two) times daily.  [name] doxepin (SINEQUAN) 100 MG capsule Take 100 mg by mouth as needed.  [name] escitalopram (LEXAPRO) 10 MG tablet TAKE ONE TABLET BY MOUTH ONCE DAILY  . estradiol (ESTRACE) 1 MG tablet TAKE ONE TABLET BY MOUTH ONCE DAILY  . folic acid (FOLVITE) 947 MCG tablet Take 400 mcg by mouth daily.  . furosemide (LASIX) 20 MG tablet TAKE ONE TABLET BY MOUTH ONCE DAILY  . gabapentin (NEURONTIN) 800 MG tablet Take 1 tablet by mouth 4 (four) times daily.   [name] glucose blood (ONE TOUCH ULTRA TEST) test strip Check Blood sugar once daily  . l-methylfolate-B6-B12 (METANX) 3-35-2 MG TABS Take 1 tablet by mouth daily.  [name] loratadine (CLARITIN) 10 MG tablet Take 10 mg by mouth daily  as needed. For allergies  . metFORMIN (GLUCOPHAGE-XR) 500 MG 24 hr tablet Take 1 tablet in the morning and 2 in the evening  . morphine (MS CONTIN) 60 MG 12 hr tablet Take 60 mg by mouth 3 (three) times daily.  . Multiple Vitamin (MULTIVITAMIN WITH MINERALS) TABS Take 1 tablet by mouth daily.  [name] omega-3 acid ethyl esters (LOVAZA) 1 G capsule Take 1 g by mouth daily.  [name] omeprazole (PRILOSEC) 40 MG capsule TAKE ONE CAPSULE BY MOUTH ONCE DAILY  . ONETOUCH DELICA LANCETS 76B MISC Check blood sugar once daily  . OVER THE COUNTER MEDICATION Take 1 tablet by mouth daily. Hair, skin, and nails vitamin  . tiZANidine (ZANAFLEX) 4 MG tablet Take 1 tablet by mouth 3 (three) times daily.    No facility-administered encounter medications on file as of 12/07/2014.    Activities of Daily Living In your present state of health, do you have  any difficulty performing the following activities: 12/07/2014 09/26/2014  Hearing? N N  Vision? N N  Difficulty concentrating or making decisions? N N  Walking or climbing stairs? Y N  Dressing or bathing? N N  Doing errands, shopping? N N  Preparing Food and eating ? N -  Using the Toilet? N -  In the past six months, have you accidently leaked urine? Y -  Do you have problems with loss of bowel control? N -  Managing your Medications? N -  Managing your Finances? N -  Housekeeping or managing your Housekeeping? N -    Patient Care Team: Rosalita Chessman, DO as PCP - General Nicholaus Bloom, MD as Consulting Physician (Neurology) Milus Banister, MD as Attending Physician (Gastroenterology)    Assessment:  Type 2 Diabetes- DM controlled. Last A1c- 6.5.  Blood sugars typically run 130's.    Hypertension-stable.  Encouraged to exercise and eat low sodium, heart healthy diet.     Exercise Activities and Dietary recommendations Current Exercise Habits:: The patient does not participate in regular exercise at present   Diet:  No longer cooks.  Eats quick meals- hot pockets, frozen meals, cereal, eggs, baked potato and chicken, salads, and sandwiches.   Admits to eating sweets everyday.    Goals    . Increase physical activity     [facility]      . Pt states she would like to sleep at least 7 hours of sleep per night.     . Reduce sugar intake     Pt would like to decrease sweet intake- eating 1 sweet 3-4 days out of the week.        Fall Risk Fall Risk  12/07/2014 09/26/2014 07/02/2013 05/14/2013  Falls in the past year? Yes Yes No Yes  Number falls in past yr: 2 or more 2 or more - 1  Injury with Fall? No No - No  Risk Factor Category  High Fall Risk - - -  Risk for fall due to : Medication side effect;Impaired balance/gait;Impaired mobility Other (Comment) - -  Risk for fall due to (comments): neuropathy in feet tripped coming in the door  - -  Follow up Education  provided Follow up appointment - -   Depression Screen PHQ 2/9 Scores 12/07/2014 09/26/2014 07/02/2013 05/14/2013  PHQ - 2 Score 1 1 0 1     Cognitive Testing MMSE - Mini Mental State Exam 12/07/2014  Orientation to time 5  Orientation to Place 5  Registration 3  Attention/  Calculation 5  Recall 3  Language- name 2 objects 2  Language- repeat 1  Language- follow 3 step command 3  Language- read & follow direction 1  Write a sentence 1  Copy design 1  Total score 30    Immunization History  Administered Date(s) Administered  . DTP 07/14/2002  . Influenza Whole 02/12/2007  . Influenza, High Dose Seasonal PF 02/11/2014  . Influenza,inj,Quad PF,36+ Mos 05/14/2013  . Pneumococcal Polysaccharide-23 05/14/2013  . Zoster 01/02/2010   Screening Tests Health Maintenance  Topic Date Due  . Hepatitis C Screening  30-Nov-1945  . TETANUS/TDAP  03/25/1967  . DEXA SCAN  03/24/2013  . PNA vac Low Risk Adult (2 of 2 - PCV13) 05/14/2014  . INFLUENZA VACCINE  11/07/2014  . OPHTHALMOLOGY EXAM  12/15/2014  . FOOT EXAM  02/12/2015  . URINE MICROALBUMIN  02/12/2015  . HEMOGLOBIN A1C  04/13/2015  . MAMMOGRAM  10/11/2015  . COLONOSCOPY  06/11/2020  . ZOSTAVAX  Completed      Plan:  Scheduled bone density scan.    Follow with Dr. Etter Sjogren as scheduled.    Make appointment with [facility]   Increase physical activity- start with walking and increase when comfortable.  Cut down on the amount of sweets eaten (maybe 3-4 a week)      During the course of the visit the patient was educated and counseled about the following appropriate screening and preventive services:   Vaccines to include Pneumoccal, Influenza, Hepatitis B, Td, Zostavax, HCV  Electrocardiogram  Cardiovascular Disease  Colorectal cancer screening  Bone density screening  Diabetes screening  Glaucoma screening  Mammography/PAP  Nutrition counseling   Patient Instructions (the written plan) was given to  the patient.   Rudene Anda, RN  12/07/2014

## 2014-12-15 ENCOUNTER — Other Ambulatory Visit: Payer: Self-pay | Admitting: Family Medicine

## 2015-01-02 ENCOUNTER — Telehealth: Payer: Self-pay | Admitting: Family Medicine

## 2015-01-02 ENCOUNTER — Other Ambulatory Visit: Payer: Self-pay | Admitting: Family Medicine

## 2015-01-02 DIAGNOSIS — Z780 Asymptomatic menopausal state: Secondary | ICD-10-CM

## 2015-01-02 NOTE — Telephone Encounter (Signed)
Relation to OL:IDCV Call back number: [phone]    Reason for call:  Patient inquiring about last bone density test and would like it done [facility].

## 2015-01-02 NOTE — Telephone Encounter (Signed)
Spoke with patient who states that she had the last one at [facility] 08/2013. Too soon to repeat. Updated HM.

## 2015-01-25 DIAGNOSIS — M5416 Radiculopathy, lumbar region: Secondary | ICD-10-CM | POA: Diagnosis not present

## 2015-01-25 DIAGNOSIS — Z79891 Long term (current) use of opiate analgesic: Secondary | ICD-10-CM | POA: Diagnosis not present

## 2015-01-25 DIAGNOSIS — G894 Chronic pain syndrome: Secondary | ICD-10-CM | POA: Diagnosis not present

## 2015-01-25 DIAGNOSIS — E1142 Type 2 diabetes mellitus with diabetic polyneuropathy: Secondary | ICD-10-CM | POA: Diagnosis not present

## 2015-02-10 DIAGNOSIS — Z23 Encounter for immunization: Secondary | ICD-10-CM | POA: Diagnosis not present

## 2015-02-22 ENCOUNTER — Other Ambulatory Visit: Payer: Self-pay | Admitting: Family Medicine

## 2015-03-28 ENCOUNTER — Ambulatory Visit: Payer: Medicare HMO | Admitting: Family Medicine

## 2015-04-13 ENCOUNTER — Other Ambulatory Visit: Payer: Self-pay | Admitting: Family Medicine

## 2015-04-26 DIAGNOSIS — Z79891 Long term (current) use of opiate analgesic: Secondary | ICD-10-CM | POA: Diagnosis not present

## 2015-04-26 DIAGNOSIS — G894 Chronic pain syndrome: Secondary | ICD-10-CM | POA: Diagnosis not present

## 2015-04-26 DIAGNOSIS — E1142 Type 2 diabetes mellitus with diabetic polyneuropathy: Secondary | ICD-10-CM | POA: Diagnosis not present

## 2015-04-28 ENCOUNTER — Ambulatory Visit: Payer: Medicare HMO | Admitting: Family Medicine

## 2015-05-04 ENCOUNTER — Encounter: Payer: Self-pay | Admitting: Family Medicine

## 2015-05-04 ENCOUNTER — Ambulatory Visit (INDEPENDENT_AMBULATORY_CARE_PROVIDER_SITE_OTHER): Payer: Medicare HMO | Admitting: Family Medicine

## 2015-05-04 VITALS — BP 110/64 | HR 55 | Temp 97.4°F | Ht 69.0 in | Wt 179.6 lb

## 2015-05-04 DIAGNOSIS — E1110 Type 2 diabetes mellitus with ketoacidosis without coma: Secondary | ICD-10-CM

## 2015-05-04 DIAGNOSIS — E1310 Other specified diabetes mellitus with ketoacidosis without coma: Secondary | ICD-10-CM | POA: Diagnosis not present

## 2015-05-04 DIAGNOSIS — E1165 Type 2 diabetes mellitus with hyperglycemia: Secondary | ICD-10-CM

## 2015-05-04 DIAGNOSIS — E1151 Type 2 diabetes mellitus with diabetic peripheral angiopathy without gangrene: Secondary | ICD-10-CM

## 2015-05-04 DIAGNOSIS — Z780 Asymptomatic menopausal state: Secondary | ICD-10-CM

## 2015-05-04 DIAGNOSIS — F32A Depression, unspecified: Secondary | ICD-10-CM

## 2015-05-04 DIAGNOSIS — Z23 Encounter for immunization: Secondary | ICD-10-CM | POA: Diagnosis not present

## 2015-05-04 DIAGNOSIS — G4700 Insomnia, unspecified: Secondary | ICD-10-CM

## 2015-05-04 DIAGNOSIS — E785 Hyperlipidemia, unspecified: Secondary | ICD-10-CM | POA: Diagnosis not present

## 2015-05-04 DIAGNOSIS — I10 Essential (primary) hypertension: Secondary | ICD-10-CM | POA: Diagnosis not present

## 2015-05-04 DIAGNOSIS — IMO0002 Reserved for concepts with insufficient information to code with codable children: Secondary | ICD-10-CM

## 2015-05-04 DIAGNOSIS — F329 Major depressive disorder, single episode, unspecified: Secondary | ICD-10-CM

## 2015-05-04 DIAGNOSIS — Z1159 Encounter for screening for other viral diseases: Secondary | ICD-10-CM

## 2015-05-04 MED ORDER — ESCITALOPRAM OXALATE 10 MG PO TABS: 10.0000 mg | ORAL_TABLET | Freq: Every day | ORAL | Status: DC

## 2015-05-04 MED ORDER — FUROSEMIDE 20 MG PO TABS: 20.0000 mg | ORAL_TABLET | Freq: Every day | ORAL | Status: DC

## 2015-05-04 MED ORDER — METFORMIN HCL ER 500 MG PO TB24: ORAL_TABLET | ORAL | Status: DC

## 2015-05-04 MED ORDER — GABAPENTIN 800 MG PO TABS: 800.0000 mg | ORAL_TABLET | Freq: Four times a day (QID) | ORAL | Status: DC

## 2015-05-04 MED ORDER — ATORVASTATIN CALCIUM 10 MG PO TABS: 10.0000 mg | ORAL_TABLET | Freq: Every day | ORAL | Status: DC

## 2015-05-04 MED ORDER — DOXEPIN HCL 100 MG PO CAPS: 100.0000 mg | ORAL_CAPSULE | ORAL | Status: DC | PRN

## 2015-05-04 MED ORDER — ESTRADIOL 1 MG PO TABS: 1.0000 mg | ORAL_TABLET | Freq: Every day | ORAL | Status: DC

## 2015-05-04 MED ORDER — AMLODIPINE BESYLATE 5 MG PO TABS: 5.0000 mg | ORAL_TABLET | Freq: Every day | ORAL | Status: DC

## 2015-05-04 MED ORDER — BISOPROLOL-HYDROCHLOROTHIAZIDE 10-6.25 MG PO TABS: 1.0000 | ORAL_TABLET | Freq: Every day | ORAL | Status: DC

## 2015-05-04 NOTE — Progress Notes (Signed)
Pre visit review using our clinic review tool, if applicable. No additional management support is needed unless otherwise documented below in the visit note. 

## 2015-05-04 NOTE — Patient Instructions (Signed)

## 2015-05-04 NOTE — Progress Notes (Signed)
Patient ID: Brittany [name], female    DOB: 1946/02/09  Age: 70 y.o. MRN: [id number]    Subjective:  Subjective HPI Brittany [name] presents for f/u dm, htn and cholesterol HYPERTENSION  Blood pressure range-not checking  Chest pain- no      Dyspnea- no Lightheadedness- no   Edema- no Other side effects - no   Medication compliance: good Low salt diet- yes  DIABETES  Blood Sugar ranges-good per pt  Polyuria- no New Visual problems- no Hypoglycemic symptoms- no Other side effects-no Medication compliance - good Last eye exam- 05/10/2015 Foot exam- today  HYPERLIPIDEMIA  Medication compliance- good RUQ pain- no  Muscle aches- no Other side effects-n0     Review of Systems  Constitutional: Negative for diaphoresis, appetite change, fatigue and unexpected weight change.  Eyes: Negative for pain, redness and visual disturbance.  Respiratory: Negative for cough, chest tightness, shortness of breath and wheezing.   Cardiovascular: Negative for chest pain, palpitations and leg swelling.  Endocrine: Negative for cold intolerance, heat intolerance, polydipsia, polyphagia and polyuria.  Genitourinary: Negative for dysuria, frequency and difficulty urinating.  Neurological: Negative for dizziness, light-headedness, numbness and headaches.    History Past Medical History  Diagnosis Date  . Hypertension   . GERD (gastroesophageal reflux disease)   . Diabetes mellitus   . Neuropathy ([place])     She has past surgical history that includes Lumbar fusion.   Her family history includes Cancer in her mother; Depression in her mother; Diabetes in her brother; Heart disease (age of onset: 11) in her father; Hyperlipidemia in her brother; Hypertension in her brother; Kidney disease in her brother; Stroke in her brother.She reports that she has never smoked. She has never used smokeless tobacco. She reports that she does not drink alcohol or use illicit drugs.  Current Outpatient  Prescriptions on File Prior to Visit  Medication Sig Dispense Refill  . aspirin 81 MG tablet Take 81 mg by mouth daily.    . calcium carbonate (TUMS EX) 750 MG chewable tablet Chew 1 tablet by mouth 4 (four) times daily as needed. For acid reflux    . Cholecalciferol (VITAMIN D) 1000 UNITS capsule Take 1,000 Units by mouth daily.    [name] docusate sodium (COLACE) 100 MG capsule Take 100 mg by mouth 2 (two) times daily.    . folic acid (FOLVITE) 035 MCG tablet Take 400 mcg by mouth daily.    [name] glucose blood (ONE TOUCH ULTRA TEST) test strip Check Blood sugar once daily 100 each 12  . l-methylfolate-B6-B12 (METANX) 3-35-2 MG TABS Take 1 tablet by mouth daily.    [name] loratadine (CLARITIN) 10 MG tablet Take 10 mg by mouth daily as needed. For allergies    . morphine (MS CONTIN) 60 MG 12 hr tablet Take 60 mg by mouth 3 (three) times daily.    . Multiple Vitamin (MULTIVITAMIN WITH MINERALS) TABS Take 1 tablet by mouth daily.    [name] omega-3 acid ethyl esters (LOVAZA) 1 G capsule Take 1 g by mouth daily.    [name] omeprazole (PRILOSEC) 40 MG capsule TAKE ONE CAPSULE BY MOUTH ONCE DAILY 90 capsule 1  . ONETOUCH DELICA LANCETS 59R MISC Check blood sugar once daily 100 each 12  . OVER THE COUNTER MEDICATION Take 1 tablet by mouth daily. Hair, skin, and nails vitamin    . tiZANidine (ZANAFLEX) 4 MG tablet Take 1 tablet by mouth 3 (three) times daily.      No current facility-administered medications  on file prior to visit.     Objective:  Objective Physical Exam  Constitutional: She is oriented to person, place, and time. She appears well-developed and well-nourished.  HENT:  Head: Normocephalic and atraumatic.  Eyes: Conjunctivae and EOM are normal.  Neck: Normal range of motion. Neck supple. No JVD present. Carotid bruit is not present. No thyromegaly present.  Cardiovascular: Normal rate, regular rhythm and normal heart sounds.   No murmur heard. Pulmonary/Chest: Effort normal and breath sounds normal. No  respiratory distress. She has no wheezes. She has no rales. She exhibits no tenderness.  Musculoskeletal: She exhibits no edema.  Neurological: She is alert and oriented to person, place, and time.  Psychiatric: She has a normal mood and affect. Her behavior is normal. Judgment and thought content normal.  Nursing note and vitals reviewed.  BP 110/64 mmHg  Pulse 55  Temp(Src) 97.4 F (36.3 C) (Oral)  Ht 5' 9"  (1.753 m)  Wt 179 lb 9.6 oz (81.466 kg)  BMI 26.51 kg/m2  SpO2 97% Wt Readings from Last 3 Encounters:  05/04/15 179 lb 9.6 oz (81.466 kg)  12/07/14 179 lb 3.2 oz (81.285 kg)  02/11/14 181 lb 12.8 oz (82.464 kg)     Lab Results  Component Value Date   WBC 8.9 10/11/2014   HGB 12.8 10/11/2014   HCT 38.3 10/11/2014   PLT 210.0 10/11/2014   GLUCOSE 128* 10/11/2014   CHOL 151 10/11/2014   TRIG 69.0 10/11/2014   HDL 69.00 10/11/2014   LDLCALC 68 10/11/2014   ALT 15 10/11/2014   AST 16 10/11/2014   NA 141 10/11/2014   K 3.7 10/11/2014   CL 102 10/11/2014   CREATININE 0.68 10/11/2014   BUN 12 10/11/2014   CO2 30 10/11/2014   TSH 0.46 06/18/2012   HGBA1C 6.5 10/11/2014   MICROALBUR 0.4 02/11/2014    Mm Digital Screening  10/12/2014  CLINICAL DATA:  Screening. EXAM: DIGITAL SCREENING BILATERAL MAMMOGRAM WITH CAD COMPARISON:  Previous exam(s). ACR Breast Density Category c: The breast tissue is heterogeneously dense, which may obscure small masses. FINDINGS: There are no findings suspicious for malignancy. Images were processed with CAD. IMPRESSION: No mammographic evidence of malignancy. A result letter of this screening mammogram will be mailed directly to the patient. RECOMMENDATION: Screening mammogram in one year. (Code:SM-B-01Y) BI-RADS CATEGORY  1: Negative. Electronically Signed   By: Brittany [name] M.D.   On: 10/12/2014 09:34     Assessment & Plan:  Plan I have changed Brittany [name]'s amLODipine, atorvastatin, bisoprolol-hydrochlorothiazide, doxepin,  escitalopram, estradiol, furosemide, and gabapentin. I am also having her maintain her tiZANidine, docusate sodium, calcium carbonate, omega-3 acid ethyl esters, Vitamin D, multivitamin with minerals, loratadine, OVER THE COUNTER MEDICATION, morphine, [id number], folic acid, aspirin, glucose blood, ONETOUCH DELICA LANCETS 24O, omeprazole, and metFORMIN.  Meds ordered this encounter  Medications  . amLODipine (NORVASC) 5 MG tablet    Sig: Take 1 tablet (5 mg total) by mouth daily.    Dispense:  90 tablet    Refill:  1  . atorvastatin (LIPITOR) 10 MG tablet    Sig: Take 1 tablet (10 mg total) by mouth daily.    Dispense:  90 tablet    Refill:  1  . bisoprolol-hydrochlorothiazide (ZIAC) 10-6.25 MG tablet    Sig: Take 1 tablet by mouth daily.    Dispense:  90 tablet    Refill:  1  . doxepin (SINEQUAN) 100 MG capsule    Sig: Take 1 capsule (100 mg total)  by mouth as needed.  [name] escitalopram (LEXAPRO) 10 MG tablet    Sig: Take 1 tablet (10 mg total) by mouth daily.    Dispense:  90 tablet    Refill:  1  . estradiol (ESTRACE) 1 MG tablet    Sig: Take 1 tablet (1 mg total) by mouth daily.    Dispense:  90 tablet    Refill:  3  . furosemide (LASIX) 20 MG tablet    Sig: Take 1 tablet (20 mg total) by mouth daily.    Dispense:  90 tablet    Refill:  1  . gabapentin (NEURONTIN) 800 MG tablet    Sig: Take 1 tablet (800 mg total) by mouth 4 (four) times daily.  . metFORMIN (GLUCOPHAGE-XR) 500 MG 24 hr tablet    Sig: TAKE ONE TABLET BY MOUTH IN THE MORNING AND TAKE TWO TABLETS BY MOUTH IN THE EVENING    Dispense:  270 tablet    Refill:  0    Problem List Items Addressed This Visit      Unprioritized   Essential hypertension    Stable con't meds  Current outpatient prescriptions:  .  amLODipine (NORVASC) 5 MG tablet, Take 1 tablet (5 mg total) by mouth daily., Disp: 90 tablet, Rfl: 1 .  aspirin 81 MG tablet, Take 81 mg by mouth daily., Disp: , Rfl:  .  atorvastatin (LIPITOR)  10 MG tablet, Take 1 tablet (10 mg total) by mouth daily., Disp: 90 tablet, Rfl: 1 .  bisoprolol-hydrochlorothiazide (ZIAC) 10-6.25 MG tablet, Take 1 tablet by mouth daily., Disp: 90 tablet, Rfl: 1 .  calcium carbonate (TUMS EX) 750 MG chewable tablet, Chew 1 tablet by mouth 4 (four) times daily as needed. For acid reflux, Disp: , Rfl:  .  Cholecalciferol (VITAMIN D) 1000 UNITS capsule, Take 1,000 Units by mouth daily., Disp: , Rfl:  .  docusate sodium (COLACE) 100 MG capsule, Take 100 mg by mouth 2 (two) times daily., Disp: , Rfl:  .  doxepin (SINEQUAN) 100 MG capsule, Take 1 capsule (100 mg total) by mouth as needed., Disp: , Rfl:  .  escitalopram (LEXAPRO) 10 MG tablet, Take 1 tablet (10 mg total) by mouth daily., Disp: 90 tablet, Rfl: 1 .  estradiol (ESTRACE) 1 MG tablet, Take 1 tablet (1 mg total) by mouth daily., Disp: 90 tablet, Rfl: 3 .  folic acid (FOLVITE) 127 MCG tablet, Take 400 mcg by mouth daily., Disp: , Rfl:  .  furosemide (LASIX) 20 MG tablet, Take 1 tablet (20 mg total) by mouth daily., Disp: 90 tablet, Rfl: 1 .  gabapentin (NEURONTIN) 800 MG tablet, Take 1 tablet (800 mg total) by mouth 4 (four) times daily., Disp: , Rfl:  .  glucose blood (ONE TOUCH ULTRA TEST) test strip, Check Blood sugar once daily, Disp: 100 each, Rfl: 12 .  l-methylfolate-B6-B12 (METANX) 3-35-2 MG TABS, Take 1 tablet by mouth daily., Disp: , Rfl:  .  loratadine (CLARITIN) 10 MG tablet, Take 10 mg by mouth daily as needed. For allergies, Disp: , Rfl:  .  metFORMIN (GLUCOPHAGE-XR) 500 MG 24 hr tablet, TAKE ONE TABLET BY MOUTH IN THE MORNING AND TAKE TWO TABLETS BY MOUTH IN THE EVENING, Disp: 270 tablet, Rfl: 0 .  morphine (MS CONTIN) 60 MG 12 hr tablet, Take 60 mg by mouth 3 (three) times daily., Disp: , Rfl:  .  Multiple Vitamin (MULTIVITAMIN WITH MINERALS) TABS, Take 1 tablet by mouth daily., Disp: , Rfl:  .  omega-3 acid ethyl esters (LOVAZA) 1 G capsule, Take 1 g by mouth daily., Disp: , Rfl:  .   omeprazole (PRILOSEC) 40 MG capsule, TAKE ONE CAPSULE BY MOUTH ONCE DAILY, Disp: 90 capsule, Rfl: 1 .  ONETOUCH DELICA LANCETS 71Q MISC, Check blood sugar once daily, Disp: 100 each, Rfl: 12 .  OVER THE COUNTER MEDICATION, Take 1 tablet by mouth daily. Hair, skin, and nails vitamin, Disp: , Rfl:  .  tiZANidine (ZANAFLEX) 4 MG tablet, Take 1 tablet by mouth 3 (three) times daily. , Disp: , Rfl:        Relevant Medications   amLODipine (NORVASC) 5 MG tablet   atorvastatin (LIPITOR) 10 MG tablet   bisoprolol-hydrochlorothiazide (ZIAC) 10-6.25 MG tablet   furosemide (LASIX) 20 MG tablet   DM (diabetes mellitus) type II uncontrolled, periph vascular disorder (HCC)     Current outpatient prescriptions:  .  amLODipine (NORVASC) 5 MG tablet, Take 1 tablet (5 mg total) by mouth daily., Disp: 90 tablet, Rfl: 1 .  aspirin 81 MG tablet, Take 81 mg by mouth daily., Disp: , Rfl:  .  atorvastatin (LIPITOR) 10 MG tablet, Take 1 tablet (10 mg total) by mouth daily., Disp: 90 tablet, Rfl: 1 .  bisoprolol-hydrochlorothiazide (ZIAC) 10-6.25 MG tablet, Take 1 tablet by mouth daily., Disp: 90 tablet, Rfl: 1 .  calcium carbonate (TUMS EX) 750 MG chewable tablet, Chew 1 tablet by mouth 4 (four) times daily as needed. For acid reflux, Disp: , Rfl:  .  Cholecalciferol (VITAMIN D) 1000 UNITS capsule, Take 1,000 Units by mouth daily., Disp: , Rfl:  .  docusate sodium (COLACE) 100 MG capsule, Take 100 mg by mouth 2 (two) times daily., Disp: , Rfl:  .  doxepin (SINEQUAN) 100 MG capsule, Take 1 capsule (100 mg total) by mouth as needed., Disp: , Rfl:  .  escitalopram (LEXAPRO) 10 MG tablet, Take 1 tablet (10 mg total) by mouth daily., Disp: 90 tablet, Rfl: 1 .  estradiol (ESTRACE) 1 MG tablet, Take 1 tablet (1 mg total) by mouth daily., Disp: 90 tablet, Rfl: 3 .  folic acid (FOLVITE) 197 MCG tablet, Take 400 mcg by mouth daily., Disp: , Rfl:  .  furosemide (LASIX) 20 MG tablet, Take 1 tablet (20 mg total) by mouth  daily., Disp: 90 tablet, Rfl: 1 .  gabapentin (NEURONTIN) 800 MG tablet, Take 1 tablet (800 mg total) by mouth 4 (four) times daily., Disp: , Rfl:  .  glucose blood (ONE TOUCH ULTRA TEST) test strip, Check Blood sugar once daily, Disp: 100 each, Rfl: 12 .  l-methylfolate-B6-B12 (METANX) 3-35-2 MG TABS, Take 1 tablet by mouth daily., Disp: , Rfl:  .  loratadine (CLARITIN) 10 MG tablet, Take 10 mg by mouth daily as needed. For allergies, Disp: , Rfl:  .  metFORMIN (GLUCOPHAGE-XR) 500 MG 24 hr tablet, TAKE ONE TABLET BY MOUTH IN THE MORNING AND TAKE TWO TABLETS BY MOUTH IN THE EVENING, Disp: 270 tablet, Rfl: 0 .  morphine (MS CONTIN) 60 MG 12 hr tablet, Take 60 mg by mouth 3 (three) times daily., Disp: , Rfl:  .  Multiple Vitamin (MULTIVITAMIN WITH MINERALS) TABS, Take 1 tablet by mouth daily., Disp: , Rfl:  .  omega-3 acid ethyl esters (LOVAZA) 1 G capsule, Take 1 g by mouth daily., Disp: , Rfl:  .  omeprazole (PRILOSEC) 40 MG capsule, TAKE ONE CAPSULE BY MOUTH ONCE DAILY, Disp: 90 capsule, Rfl: 1 .  ONETOUCH DELICA LANCETS 58I MISC, Check blood  sugar once daily, Disp: 100 each, Rfl: 12 .  OVER THE COUNTER MEDICATION, Take 1 tablet by mouth daily. Hair, skin, and nails vitamin, Disp: , Rfl:  .  tiZANidine (ZANAFLEX) 4 MG tablet, Take 1 tablet by mouth 3 (three) times daily. , Disp: , Rfl:  Check labs       Relevant Medications   amLODipine (NORVASC) 5 MG tablet   atorvastatin (LIPITOR) 10 MG tablet   bisoprolol-hydrochlorothiazide (ZIAC) 10-6.25 MG tablet   furosemide (LASIX) 20 MG tablet   metFORMIN (GLUCOPHAGE-XR) 500 MG 24 hr tablet    Other Visit Diagnoses    Uncontrolled type 2 diabetes mellitus with ketoacidosis without coma, without long-term current use of insulin (HCC)    -  Primary    Relevant Medications    atorvastatin (LIPITOR) 10 MG tablet    bisoprolol-hydrochlorothiazide (ZIAC) 10-6.25 MG tablet    gabapentin (NEURONTIN) 800 MG tablet    metFORMIN (GLUCOPHAGE-XR) 500 MG 24  hr tablet    Other Relevant Orders    Comp Met (CMET)    Hemoglobin A1c    POCT urinalysis dipstick    Need for pneumococcal vaccination        Relevant Orders    Pneumococcal conjugate vaccine 13-valent (Completed)    Hyperlipidemia LDL goal <70        Relevant Medications    amLODipine (NORVASC) 5 MG tablet    atorvastatin (LIPITOR) 10 MG tablet    bisoprolol-hydrochlorothiazide (ZIAC) 10-6.25 MG tablet    furosemide (LASIX) 20 MG tablet    Other Relevant Orders    Comp Met (CMET)    Lipid panel    POCT urinalysis dipstick    Need for hepatitis C screening test        Relevant Orders    Hepatitis C antibody    Insomnia        Relevant Medications    doxepin (SINEQUAN) 100 MG capsule    Depression        Relevant Medications    doxepin (SINEQUAN) 100 MG capsule    escitalopram (LEXAPRO) 10 MG tablet    Menopause        Relevant Medications    estradiol (ESTRACE) 1 MG tablet       Follow-up: Return in about 6 months (around 11/01/2015), or if symptoms worsen or fail to improve, for annual exam, fasting.  Garnet Koyanagi, DO

## 2015-05-06 NOTE — Assessment & Plan Note (Signed)
Stable con't meds  Current outpatient prescriptions:  .  amLODipine (NORVASC) 5 MG tablet, Take 1 tablet (5 mg total) by mouth daily., Disp: 90 tablet, Rfl: 1 .  aspirin 81 MG tablet, Take 81 mg by mouth daily., Disp: , Rfl:  .  atorvastatin (LIPITOR) 10 MG tablet, Take 1 tablet (10 mg total) by mouth daily., Disp: 90 tablet, Rfl: 1 .  bisoprolol-hydrochlorothiazide (ZIAC) 10-6.25 MG tablet, Take 1 tablet by mouth daily., Disp: 90 tablet, Rfl: 1 .  calcium carbonate (TUMS EX) 750 MG chewable tablet, Chew 1 tablet by mouth 4 (four) times daily as needed. For acid reflux, Disp: , Rfl:  .  Cholecalciferol (VITAMIN D) 1000 UNITS capsule, Take 1,000 Units by mouth daily., Disp: , Rfl:  .  docusate sodium (COLACE) 100 MG capsule, Take 100 mg by mouth 2 (two) times daily., Disp: , Rfl:  .  doxepin (SINEQUAN) 100 MG capsule, Take 1 capsule (100 mg total) by mouth as needed., Disp: , Rfl:  .  escitalopram (LEXAPRO) 10 MG tablet, Take 1 tablet (10 mg total) by mouth daily., Disp: 90 tablet, Rfl: 1 .  estradiol (ESTRACE) 1 MG tablet, Take 1 tablet (1 mg total) by mouth daily., Disp: 90 tablet, Rfl: 3 .  folic acid (FOLVITE) Q000111Q MCG tablet, Take 400 mcg by mouth daily., Disp: , Rfl:  .  furosemide (LASIX) 20 MG tablet, Take 1 tablet (20 mg total) by mouth daily., Disp: 90 tablet, Rfl: 1 .  gabapentin (NEURONTIN) 800 MG tablet, Take 1 tablet (800 mg total) by mouth 4 (four) times daily., Disp: , Rfl:  .  glucose blood (ONE TOUCH ULTRA TEST) test strip, Check Blood sugar once daily, Disp: 100 each, Rfl: 12 .  l-methylfolate-B6-B12 (METANX) 3-35-2 MG TABS, Take 1 tablet by mouth daily., Disp: , Rfl:  .  loratadine (CLARITIN) 10 MG tablet, Take 10 mg by mouth daily as needed. For allergies, Disp: , Rfl:  .  metFORMIN (GLUCOPHAGE-XR) 500 MG 24 hr tablet, TAKE ONE TABLET BY MOUTH IN THE MORNING AND TAKE TWO TABLETS BY MOUTH IN THE EVENING, Disp: 270 tablet, Rfl: 0 .  morphine (MS CONTIN) 60 MG 12 hr tablet, Take  60 mg by mouth 3 (three) times daily., Disp: , Rfl:  .  Multiple Vitamin (MULTIVITAMIN WITH MINERALS) TABS, Take 1 tablet by mouth daily., Disp: , Rfl:  .  omega-3 acid ethyl esters (LOVAZA) 1 G capsule, Take 1 g by mouth daily., Disp: , Rfl:  .  omeprazole (PRILOSEC) 40 MG capsule, TAKE ONE CAPSULE BY MOUTH ONCE DAILY, Disp: 90 capsule, Rfl: 1 .  ONETOUCH DELICA LANCETS 99991111 MISC, Check blood sugar once daily, Disp: 100 each, Rfl: 12 .  OVER THE COUNTER MEDICATION, Take 1 tablet by mouth daily. Hair, skin, and nails vitamin, Disp: , Rfl:  .  tiZANidine (ZANAFLEX) 4 MG tablet, Take 1 tablet by mouth 3 (three) times daily. , Disp: , Rfl:

## 2015-05-06 NOTE — Assessment & Plan Note (Addendum)
  Current outpatient prescriptions:  .  amLODipine (NORVASC) 5 MG tablet, Take 1 tablet (5 mg total) by mouth daily., Disp: 90 tablet, Rfl: 1 .  aspirin 81 MG tablet, Take 81 mg by mouth daily., Disp: , Rfl:  .  atorvastatin (LIPITOR) 10 MG tablet, Take 1 tablet (10 mg total) by mouth daily., Disp: 90 tablet, Rfl: 1 .  bisoprolol-hydrochlorothiazide (ZIAC) 10-6.25 MG tablet, Take 1 tablet by mouth daily., Disp: 90 tablet, Rfl: 1 .  calcium carbonate (TUMS EX) 750 MG chewable tablet, Chew 1 tablet by mouth 4 (four) times daily as needed. For acid reflux, Disp: , Rfl:  .  Cholecalciferol (VITAMIN D) 1000 UNITS capsule, Take 1,000 Units by mouth daily., Disp: , Rfl:  .  docusate sodium (COLACE) 100 MG capsule, Take 100 mg by mouth 2 (two) times daily., Disp: , Rfl:  .  doxepin (SINEQUAN) 100 MG capsule, Take 1 capsule (100 mg total) by mouth as needed., Disp: , Rfl:  .  escitalopram (LEXAPRO) 10 MG tablet, Take 1 tablet (10 mg total) by mouth daily., Disp: 90 tablet, Rfl: 1 .  estradiol (ESTRACE) 1 MG tablet, Take 1 tablet (1 mg total) by mouth daily., Disp: 90 tablet, Rfl: 3 .  folic acid (FOLVITE) Q000111Q MCG tablet, Take 400 mcg by mouth daily., Disp: , Rfl:  .  furosemide (LASIX) 20 MG tablet, Take 1 tablet (20 mg total) by mouth daily., Disp: 90 tablet, Rfl: 1 .  gabapentin (NEURONTIN) 800 MG tablet, Take 1 tablet (800 mg total) by mouth 4 (four) times daily., Disp: , Rfl:  .  glucose blood (ONE TOUCH ULTRA TEST) test strip, Check Blood sugar once daily, Disp: 100 each, Rfl: 12 .  l-methylfolate-B6-B12 (METANX) 3-35-2 MG TABS, Take 1 tablet by mouth daily., Disp: , Rfl:  .  loratadine (CLARITIN) 10 MG tablet, Take 10 mg by mouth daily as needed. For allergies, Disp: , Rfl:  .  metFORMIN (GLUCOPHAGE-XR) 500 MG 24 hr tablet, TAKE ONE TABLET BY MOUTH IN THE MORNING AND TAKE TWO TABLETS BY MOUTH IN THE EVENING, Disp: 270 tablet, Rfl: 0 .  morphine (MS CONTIN) 60 MG 12 hr tablet, Take 60 mg by mouth 3  (three) times daily., Disp: , Rfl:  .  Multiple Vitamin (MULTIVITAMIN WITH MINERALS) TABS, Take 1 tablet by mouth daily., Disp: , Rfl:  .  omega-3 acid ethyl esters (LOVAZA) 1 G capsule, Take 1 g by mouth daily., Disp: , Rfl:  .  omeprazole (PRILOSEC) 40 MG capsule, TAKE ONE CAPSULE BY MOUTH ONCE DAILY, Disp: 90 capsule, Rfl: 1 .  ONETOUCH DELICA LANCETS 99991111 MISC, Check blood sugar once daily, Disp: 100 each, Rfl: 12 .  OVER THE COUNTER MEDICATION, Take 1 tablet by mouth daily. Hair, skin, and nails vitamin, Disp: , Rfl:  .  tiZANidine (ZANAFLEX) 4 MG tablet, Take 1 tablet by mouth 3 (three) times daily. , Disp: , Rfl:  Check labs

## 2015-05-08 ENCOUNTER — Other Ambulatory Visit (INDEPENDENT_AMBULATORY_CARE_PROVIDER_SITE_OTHER): Payer: Medicare HMO

## 2015-05-08 DIAGNOSIS — E1110 Type 2 diabetes mellitus with ketoacidosis without coma: Secondary | ICD-10-CM

## 2015-05-08 DIAGNOSIS — R82998 Other abnormal findings in urine: Secondary | ICD-10-CM

## 2015-05-08 DIAGNOSIS — Z1159 Encounter for screening for other viral diseases: Secondary | ICD-10-CM

## 2015-05-08 DIAGNOSIS — E1310 Other specified diabetes mellitus with ketoacidosis without coma: Secondary | ICD-10-CM | POA: Diagnosis not present

## 2015-05-08 DIAGNOSIS — E785 Hyperlipidemia, unspecified: Secondary | ICD-10-CM | POA: Diagnosis not present

## 2015-05-08 DIAGNOSIS — R8299 Other abnormal findings in urine: Secondary | ICD-10-CM | POA: Diagnosis not present

## 2015-05-08 LAB — POCT URINALYSIS DIPSTICK
Bilirubin, UA: NEGATIVE
GLUCOSE UA: NEGATIVE
Ketones, UA: NEGATIVE
Nitrite, UA: NEGATIVE
PH UA: 5
Protein, UA: NEGATIVE
RBC UA: NEGATIVE
SPEC GRAV UA: 1.025
UROBILINOGEN UA: 0.2

## 2015-05-08 LAB — LIPID PANEL
CHOL/HDL RATIO: 2
Cholesterol: 158 mg/dL (ref 0–200)
HDL: 64.1 mg/dL (ref >39.00)
LDL CALC: 68 mg/dL (ref 0–99)
NONHDL: 94.29
TRIGLYCERIDES: 132 mg/dL (ref 0.0–149.0)
VLDL: 26.4 mg/dL (ref 0.0–40.0)

## 2015-05-08 LAB — COMPREHENSIVE METABOLIC PANEL
ALK PHOS: 49 U/L (ref 39–117)
ALT: 14 U/L (ref 0–35)
AST: 18 U/L (ref 0–37)
Albumin: 4.2 g/dL (ref 3.5–5.2)
BILIRUBIN TOTAL: 0.3 mg/dL (ref 0.2–1.2)
BUN: 15 mg/dL (ref 6–23)
CO2: 35 meq/L — AB (ref 19–32)
CREATININE: 0.73 mg/dL (ref 0.40–1.20)
Calcium: 9.5 mg/dL (ref 8.4–10.5)
Chloride: 98 mEq/L (ref 96–112)
GFR: 84.49 mL/min (ref >60.00)
GLUCOSE: 114 mg/dL — AB (ref 70–99)
Potassium: 3.7 mEq/L (ref 3.5–5.1)
Sodium: 141 mEq/L (ref 135–145)
Total Protein: 7.5 g/dL (ref 6.0–8.3)

## 2015-05-08 LAB — HEMOGLOBIN A1C: Hgb A1c MFr Bld: 6.6 % — ABNORMAL HIGH (ref 4.6–6.5)

## 2015-05-08 LAB — HEPATITIS C ANTIBODY: HCV AB: NEGATIVE

## 2015-05-08 NOTE — Addendum Note (Signed)
Addended by: Caffie Pinto on: 05/08/2015 03:21 PM   Modules accepted: Orders

## 2015-05-09 LAB — URINE CULTURE

## 2015-06-05 NOTE — Progress Notes (Signed)
reveiwed

## 2015-06-12 ENCOUNTER — Telehealth: Payer: Self-pay | Admitting: Family Medicine

## 2015-06-12 ENCOUNTER — Other Ambulatory Visit: Payer: Self-pay | Admitting: Family Medicine

## 2015-06-12 NOTE — Telephone Encounter (Signed)
Is she needing a refill right now?    KP

## 2015-06-12 NOTE — Telephone Encounter (Signed)
Caller name: Lema   Relationship to patient: Self   Can be reached: [phone]  Pharmacy: [facility] [place], [place]  Reason for call: Pt says that she received a letter from her insurance company stating that she can only receive a 30 day supply of her estradiol medication at a time going forward.

## 2015-06-12 NOTE — Telephone Encounter (Signed)
Pt says that she was just calling in to make PCP aware.

## 2015-06-22 ENCOUNTER — Telehealth: Payer: Self-pay | Admitting: Family Medicine

## 2015-06-22 NOTE — Telephone Encounter (Signed)
Relation to [id number] Call back number:[phone]  Reason for call:  Patient received a letter from [facility] stating she has a temporary refill for estradiol (ESTRACE) 1 MG tablet. Informed patient she has 1 refill left  [facility] [place], [place] - [address] [phone] (Phone) [phone] (Fax)       Patient states she's not out of medication in need of understanding regarding what letter may be implying, patient states Maudie Mercury would know.

## 2015-06-23 NOTE — Telephone Encounter (Signed)
Spoke with patient and I made her aware, that she can drop off the formulary on Monday to see if we can change it to something else, she verbalized understanding and will drop it off on Monday.   KP

## 2015-06-26 DIAGNOSIS — G894 Chronic pain syndrome: Secondary | ICD-10-CM | POA: Diagnosis not present

## 2015-06-26 DIAGNOSIS — Z79891 Long term (current) use of opiate analgesic: Secondary | ICD-10-CM | POA: Diagnosis not present

## 2015-06-26 DIAGNOSIS — E1142 Type 2 diabetes mellitus with diabetic polyneuropathy: Secondary | ICD-10-CM | POA: Diagnosis not present

## 2015-06-29 ENCOUNTER — Encounter: Payer: Self-pay | Admitting: Gastroenterology

## 2015-07-03 ENCOUNTER — Other Ambulatory Visit: Payer: Self-pay

## 2015-07-03 DIAGNOSIS — Z780 Asymptomatic menopausal state: Secondary | ICD-10-CM

## 2015-07-03 MED ORDER — ESTRADIOL 1 MG PO TABS
1.0000 mg | ORAL_TABLET | Freq: Every day | ORAL | Status: DC
Start: 2015-07-03 — End: 2016-06-06

## 2015-07-12 ENCOUNTER — Other Ambulatory Visit: Payer: Self-pay | Admitting: Family Medicine

## 2015-07-12 NOTE — Telephone Encounter (Signed)
Medication filled to pharmacy as requested.   

## 2015-08-23 DIAGNOSIS — E1142 Type 2 diabetes mellitus with diabetic polyneuropathy: Secondary | ICD-10-CM | POA: Diagnosis not present

## 2015-08-23 DIAGNOSIS — G894 Chronic pain syndrome: Secondary | ICD-10-CM | POA: Diagnosis not present

## 2015-08-23 DIAGNOSIS — G4700 Insomnia, unspecified: Secondary | ICD-10-CM | POA: Diagnosis not present

## 2015-08-23 DIAGNOSIS — Z79891 Long term (current) use of opiate analgesic: Secondary | ICD-10-CM | POA: Diagnosis not present

## 2015-09-09 ENCOUNTER — Other Ambulatory Visit: Payer: Self-pay | Admitting: Family Medicine

## 2015-10-11 ENCOUNTER — Other Ambulatory Visit: Payer: Self-pay | Admitting: Family Medicine

## 2015-10-24 ENCOUNTER — Other Ambulatory Visit: Payer: Self-pay | Admitting: Family Medicine

## 2015-10-25 DIAGNOSIS — Z79891 Long term (current) use of opiate analgesic: Secondary | ICD-10-CM | POA: Diagnosis not present

## 2015-10-25 DIAGNOSIS — G894 Chronic pain syndrome: Secondary | ICD-10-CM | POA: Diagnosis not present

## 2015-10-25 DIAGNOSIS — E1142 Type 2 diabetes mellitus with diabetic polyneuropathy: Secondary | ICD-10-CM | POA: Diagnosis not present

## 2015-10-25 DIAGNOSIS — G4700 Insomnia, unspecified: Secondary | ICD-10-CM | POA: Diagnosis not present

## 2015-11-02 ENCOUNTER — Encounter: Payer: Medicare HMO | Admitting: Family Medicine

## 2015-11-30 ENCOUNTER — Encounter: Payer: Self-pay | Admitting: Family Medicine

## 2015-11-30 ENCOUNTER — Telehealth: Payer: Self-pay | Admitting: Family Medicine

## 2015-11-30 NOTE — Telephone Encounter (Signed)
Mailed letter to patient informing that the appointment scheduled for 01/15/2016 with Dr. Carollee Herter has been cancelled. Instructed patient to call the office to reschedule.

## 2015-12-08 ENCOUNTER — Ambulatory Visit: Payer: Medicare HMO | Admitting: *Deleted

## 2015-12-11 ENCOUNTER — Other Ambulatory Visit: Payer: Self-pay | Admitting: Family Medicine

## 2015-12-21 ENCOUNTER — Other Ambulatory Visit: Payer: Self-pay | Admitting: Family Medicine

## 2015-12-21 DIAGNOSIS — Z1231 Encounter for screening mammogram for malignant neoplasm of breast: Secondary | ICD-10-CM

## 2015-12-27 DIAGNOSIS — Z79891 Long term (current) use of opiate analgesic: Secondary | ICD-10-CM | POA: Diagnosis not present

## 2015-12-27 DIAGNOSIS — E1142 Type 2 diabetes mellitus with diabetic polyneuropathy: Secondary | ICD-10-CM | POA: Diagnosis not present

## 2015-12-27 DIAGNOSIS — G4700 Insomnia, unspecified: Secondary | ICD-10-CM | POA: Diagnosis not present

## 2015-12-27 DIAGNOSIS — G894 Chronic pain syndrome: Secondary | ICD-10-CM | POA: Diagnosis not present

## 2016-01-01 ENCOUNTER — Other Ambulatory Visit (HOSPITAL_BASED_OUTPATIENT_CLINIC_OR_DEPARTMENT_OTHER): Payer: Medicare HMO

## 2016-01-01 ENCOUNTER — Ambulatory Visit (HOSPITAL_BASED_OUTPATIENT_CLINIC_OR_DEPARTMENT_OTHER): Payer: Medicare HMO

## 2016-01-11 ENCOUNTER — Other Ambulatory Visit: Payer: Self-pay | Admitting: Family Medicine

## 2016-01-11 ENCOUNTER — Encounter: Payer: Medicare HMO | Admitting: Family Medicine

## 2016-01-15 ENCOUNTER — Encounter: Payer: Medicare HMO | Admitting: Family Medicine

## 2016-01-24 ENCOUNTER — Other Ambulatory Visit: Payer: Self-pay | Admitting: Family Medicine

## 2016-02-19 ENCOUNTER — Encounter: Payer: Self-pay | Admitting: Family Medicine

## 2016-02-19 ENCOUNTER — Ambulatory Visit (INDEPENDENT_AMBULATORY_CARE_PROVIDER_SITE_OTHER): Payer: Medicare HMO | Admitting: Family Medicine

## 2016-02-19 VITALS — BP 120/60 | HR 57 | Temp 98.3°F | Resp 16 | Ht 69.0 in | Wt 180.2 lb

## 2016-02-19 DIAGNOSIS — Z1239 Encounter for other screening for malignant neoplasm of breast: Secondary | ICD-10-CM

## 2016-02-19 DIAGNOSIS — E785 Hyperlipidemia, unspecified: Secondary | ICD-10-CM

## 2016-02-19 DIAGNOSIS — Z23 Encounter for immunization: Secondary | ICD-10-CM | POA: Diagnosis not present

## 2016-02-19 DIAGNOSIS — Z0000 Encounter for general adult medical examination without abnormal findings: Secondary | ICD-10-CM | POA: Diagnosis not present

## 2016-02-19 DIAGNOSIS — E1151 Type 2 diabetes mellitus with diabetic peripheral angiopathy without gangrene: Secondary | ICD-10-CM | POA: Diagnosis not present

## 2016-02-19 DIAGNOSIS — E1165 Type 2 diabetes mellitus with hyperglycemia: Secondary | ICD-10-CM

## 2016-02-19 DIAGNOSIS — F411 Generalized anxiety disorder: Secondary | ICD-10-CM

## 2016-02-19 DIAGNOSIS — Z1231 Encounter for screening mammogram for malignant neoplasm of breast: Secondary | ICD-10-CM

## 2016-02-19 DIAGNOSIS — IMO0002 Reserved for concepts with insufficient information to code with codable children: Secondary | ICD-10-CM

## 2016-02-19 DIAGNOSIS — R69 Illness, unspecified: Secondary | ICD-10-CM | POA: Diagnosis not present

## 2016-02-19 DIAGNOSIS — I10 Essential (primary) hypertension: Secondary | ICD-10-CM

## 2016-02-19 DIAGNOSIS — E2839 Other primary ovarian failure: Secondary | ICD-10-CM

## 2016-02-19 MED ORDER — ESCITALOPRAM OXALATE 10 MG PO TABS: 10.0000 mg | ORAL_TABLET | Freq: Every day | ORAL | 0 refills | Status: DC

## 2016-02-19 MED ORDER — ATORVASTATIN CALCIUM 10 MG PO TABS: 10.0000 mg | ORAL_TABLET | Freq: Every day | ORAL | 0 refills | Status: DC

## 2016-02-19 MED ORDER — FUROSEMIDE 20 MG PO TABS: 20.0000 mg | ORAL_TABLET | Freq: Every day | ORAL | 1 refills | Status: DC

## 2016-02-19 MED ORDER — BISOPROLOL-HYDROCHLOROTHIAZIDE 10-6.25 MG PO TABS: 1.0000 | ORAL_TABLET | Freq: Every day | ORAL | 0 refills | Status: DC

## 2016-02-19 MED ORDER — METFORMIN HCL ER 500 MG PO TB24: ORAL_TABLET | ORAL | 0 refills | Status: DC

## 2016-02-19 MED ORDER — AMLODIPINE BESYLATE 5 MG PO TABS: 5.0000 mg | ORAL_TABLET | Freq: Every day | ORAL | 1 refills | Status: DC

## 2016-02-19 NOTE — Patient Instructions (Signed)
Preventive Care for Adults, Female A healthy lifestyle and preventive care can promote health and wellness. Preventive health guidelines for women include the following key practices.  A routine yearly physical is a good way to check with your health care provider about your health and preventive screening. It is a chance to share any concerns and updates on your health and to receive a thorough exam.  Visit your dentist for a routine exam and preventive care every 6 months. Brush your teeth twice a day and floss once a day. Good oral hygiene prevents tooth decay and gum disease.  The frequency of eye exams is based on your age, health, family medical history, use of contact lenses, and other factors. Follow your health care provider's recommendations for frequency of eye exams.  Eat a healthy diet. Foods like vegetables, fruits, whole grains, low-fat dairy products, and lean protein foods contain the nutrients you need without too many calories. Decrease your intake of foods high in solid fats, added sugars, and salt. Eat the right amount of calories for you.Get information about a proper diet from your health care provider, if necessary.  Regular physical exercise is one of the most important things you can do for your health. Most adults should get at least 150 minutes of moderate-intensity exercise (any activity that increases your heart rate and causes you to sweat) each week. In addition, most adults need muscle-strengthening exercises on 2 or more days a week.  Maintain a healthy weight. The body mass index (BMI) is a screening tool to identify possible weight problems. It provides an estimate of body fat based on height and weight. Your health care provider can find your BMI and can help you achieve or maintain a healthy weight.For adults 20 years and older:  A BMI below 18.5 is considered underweight.  A BMI of 18.5 to 24.9 is normal.  A BMI of 25 to 29.9 is considered overweight.  A  BMI of 30 and above is considered obese.  Maintain normal blood lipids and cholesterol levels by exercising and minimizing your intake of saturated fat. Eat a balanced diet with plenty of fruit and vegetables. Blood tests for lipids and cholesterol should begin at age 45 and be repeated every 5 years. If your lipid or cholesterol levels are high, you are over 50, or you are at high risk for heart disease, you may need your cholesterol levels checked more frequently.Ongoing high lipid and cholesterol levels should be treated with medicines if diet and exercise are not working.  If you smoke, find out from your health care provider how to quit. If you do not use tobacco, do not start.  Lung cancer screening is recommended for adults aged 45-80 years who are at high risk for developing lung cancer because of a history of smoking. A yearly low-dose CT scan of the lungs is recommended for people who have at least a 30-pack-year history of smoking and are a current smoker or have quit within the past 15 years. A pack year of smoking is smoking an average of 1 pack of cigarettes a day for 1 year (for example: 1 pack a day for 30 years or 2 packs a day for 15 years). Yearly screening should continue until the smoker has stopped smoking for at least 15 years. Yearly screening should be stopped for people who develop a health problem that would prevent them from having lung cancer treatment.  If you are pregnant, do not drink alcohol. If you are  breastfeeding, be very cautious about drinking alcohol. If you are not pregnant and choose to drink alcohol, do not have more than 1 drink per day. One drink is considered to be 12 ounces (355 mL) of beer, 5 ounces (148 mL) of wine, or 1.5 ounces (44 mL) of liquor.  Avoid use of street drugs. Do not share needles with anyone. Ask for help if you need support or instructions about stopping the use of drugs.  High blood pressure causes heart disease and increases the risk  of stroke. Your blood pressure should be checked at least every 1 to 2 years. Ongoing high blood pressure should be treated with medicines if weight loss and exercise do not work.  If you are 55-79 years old, ask your health care provider if you should take aspirin to prevent strokes.  Diabetes screening is done by taking a blood sample to check your blood glucose level after you have not eaten for a certain period of time (fasting). If you are not overweight and you do not have risk factors for diabetes, you should be screened once every 3 years starting at age 45. If you are overweight or obese and you are 40-70 years of age, you should be screened for diabetes every year as part of your cardiovascular risk assessment.  Breast cancer screening is essential preventive care for women. You should practice "breast self-awareness." This means understanding the normal appearance and feel of your breasts and may include breast self-examination. Any changes detected, no matter how small, should be reported to a health care provider. Women in their 20s and 30s should have a clinical breast exam (CBE) by a health care provider as part of a regular health exam every 1 to 3 years. After age 40, women should have a CBE every year. Starting at age 40, women should consider having a mammogram (breast X-ray test) every year. Women who have a family history of breast cancer should talk to their health care provider about genetic screening. Women at a high risk of breast cancer should talk to their health care providers about having an MRI and a mammogram every year.  Breast cancer gene (BRCA)-related cancer risk assessment is recommended for women who have family members with BRCA-related cancers. BRCA-related cancers include breast, ovarian, tubal, and peritoneal cancers. Having family members with these cancers may be associated with an increased risk for harmful changes (mutations) in the breast cancer genes BRCA1 and  BRCA2. Results of the assessment will determine the need for genetic counseling and BRCA1 and BRCA2 testing.  Your health care provider may recommend that you be screened regularly for cancer of the pelvic organs (ovaries, uterus, and vagina). This screening involves a pelvic examination, including checking for microscopic changes to the surface of your cervix (Pap test). You may be encouraged to have this screening done every 3 years, beginning at age 21.  For women ages 30-65, health care providers may recommend pelvic exams and Pap testing every 3 years, or they may recommend the Pap and pelvic exam, combined with testing for human papilloma virus (HPV), every 5 years. Some types of HPV increase your risk of cervical cancer. Testing for HPV may also be done on women of any age with unclear Pap test results.  Other health care providers may not recommend any screening for nonpregnant women who are considered low risk for pelvic cancer and who do not have symptoms. Ask your health care provider if a screening pelvic exam is right for   you.  If you have had past treatment for cervical cancer or a condition that could lead to cancer, you need Pap tests and screening for cancer for at least 20 years after your treatment. If Pap tests have been discontinued, your risk factors (such as having a new sexual partner) need to be reassessed to determine if screening should resume. Some women have medical problems that increase the chance of getting cervical cancer. In these cases, your health care provider may recommend more frequent screening and Pap tests.  Colorectal cancer can be detected and often prevented. Most routine colorectal cancer screening begins at the age of 50 years and continues through age 75 years. However, your health care provider may recommend screening at an earlier age if you have risk factors for colon cancer. On a yearly basis, your health care provider may provide home test kits to check  for hidden blood in the stool. Use of a small camera at the end of a tube, to directly examine the colon (sigmoidoscopy or colonoscopy), can detect the earliest forms of colorectal cancer. Talk to your health care provider about this at age 50, when routine screening begins. Direct exam of the colon should be repeated every 5-10 years through age 75 years, unless early forms of precancerous polyps or small growths are found.  People who are at an increased risk for hepatitis B should be screened for this virus. You are considered at high risk for hepatitis B if:  You were born in a country where hepatitis B occurs often. Talk with your health care provider about which countries are considered high risk.  Your parents were born in a high-risk country and you have not received a shot to protect against hepatitis B (hepatitis B vaccine).  You have HIV or AIDS.  You use needles to inject street drugs.  You live with, or have sex with, someone who has hepatitis B.  You get hemodialysis treatment.  You take certain medicines for conditions like cancer, organ transplantation, and autoimmune conditions.  Hepatitis C blood testing is recommended for all people born from 1945 through 1965 and any individual with known risks for hepatitis C.  Practice safe sex. Use condoms and avoid high-risk sexual practices to reduce the spread of sexually transmitted infections (STIs). STIs include gonorrhea, chlamydia, syphilis, trichomonas, herpes, HPV, and human immunodeficiency virus (HIV). Herpes, HIV, and HPV are viral illnesses that have no cure. They can result in disability, cancer, and death.  You should be screened for sexually transmitted illnesses (STIs) including gonorrhea and chlamydia if:  You are sexually active and are younger than 24 years.  You are older than 24 years and your health care provider tells you that you are at risk for this type of infection.  Your sexual activity has changed  since you were last screened and you are at an increased risk for chlamydia or gonorrhea. Ask your health care provider if you are at risk.  If you are at risk of being infected with HIV, it is recommended that you take a prescription medicine daily to prevent HIV infection. This is called preexposure prophylaxis (PrEP). You are considered at risk if:  You are sexually active and do not regularly use condoms or know the HIV status of your partner(s).  You take drugs by injection.  You are sexually active with a partner who has HIV.  Talk with your health care provider about whether you are at high risk of being infected with HIV. If   you choose to begin PrEP, you should first be tested for HIV. You should then be tested every 3 months for as long as you are taking PrEP.  Osteoporosis is a disease in which the bones lose minerals and strength with aging. This can result in serious bone fractures or breaks. The risk of osteoporosis can be identified using a bone density scan. Women ages 16 years and over and women at risk for fractures or osteoporosis should discuss screening with their health care providers. Ask your health care provider whether you should take a calcium supplement or vitamin D to reduce the rate of osteoporosis.  Menopause can be associated with physical symptoms and risks. Hormone replacement therapy is available to decrease symptoms and risks. You should talk to your health care provider about whether hormone replacement therapy is right for you.  Use sunscreen. Apply sunscreen liberally and repeatedly throughout the day. You should seek shade when your shadow is shorter than you. Protect yourself by wearing long sleeves, pants, a wide-brimmed hat, and sunglasses year round, whenever you are outdoors.  Once a month, do a whole body skin exam, using a mirror to look at the skin on your back. Tell your health care provider of new moles, moles that have irregular borders, moles that  are larger than a pencil eraser, or moles that have changed in shape or color.  Stay current with required vaccines (immunizations).  Influenza vaccine. All adults should be immunized every year.  Tetanus, diphtheria, and acellular pertussis (Td, Tdap) vaccine. Pregnant women should receive 1 dose of Tdap vaccine during each pregnancy. The dose should be obtained regardless of the length of time since the last dose. Immunization is preferred during the 27th-36th week of gestation. An adult who has not previously received Tdap or who does not know her vaccine status should receive 1 dose of Tdap. This initial dose should be followed by tetanus and diphtheria toxoids (Td) booster doses every 10 years. Adults with an unknown or incomplete history of completing a 3-dose immunization series with Td-containing vaccines should begin or complete a primary immunization series including a Tdap dose. Adults should receive a Td booster every 10 years.  Varicella vaccine. An adult without evidence of immunity to varicella should receive 2 doses or a second dose if she has previously received 1 dose. Pregnant females who do not have evidence of immunity should receive the first dose after pregnancy. This first dose should be obtained before leaving the health care facility. The second dose should be obtained 4-8 weeks after the first dose.  Human papillomavirus (HPV) vaccine. Females aged 13-26 years who have not received the vaccine previously should obtain the 3-dose series. The vaccine is not recommended for use in pregnant females. However, pregnancy testing is not needed before receiving a dose. If a female is found to be pregnant after receiving a dose, no treatment is needed. In that case, the remaining doses should be delayed until after the pregnancy. Immunization is recommended for any person with an immunocompromised condition through the age of 41 years if she did not get any or all doses earlier. During the  3-dose series, the second dose should be obtained 4-8 weeks after the first dose. The third dose should be obtained 24 weeks after the first dose and 16 weeks after the second dose.  Zoster vaccine. One dose is recommended for adults aged 35 years or older unless certain conditions are present.  Measles, mumps, and rubella (MMR) vaccine. Adults born  before 1957 generally are considered immune to measles and mumps. Adults born in [birth year] or later should have 1 or more doses of MMR vaccine unless there is a contraindication to the vaccine or there is laboratory evidence of immunity to each of the three diseases. A routine second dose of MMR vaccine should be obtained at least 28 days after the first dose for students attending postsecondary schools, health care workers, or international travelers. People who received inactivated measles vaccine or an unknown type of measles vaccine during 1963-1967 should receive 2 doses of MMR vaccine. People who received inactivated mumps vaccine or an unknown type of mumps vaccine before 1979 and are at high risk for mumps infection should consider immunization with 2 doses of MMR vaccine. For females of childbearing age, rubella immunity should be determined. If there is no evidence of immunity, females who are not pregnant should be vaccinated. If there is no evidence of immunity, females who are pregnant should delay immunization until after pregnancy. Unvaccinated health care workers born before [birth year] who lack laboratory evidence of measles, mumps, or rubella immunity or laboratory confirmation of disease should consider measles and mumps immunization with 2 doses of MMR vaccine or rubella immunization with 1 dose of MMR vaccine.  Pneumococcal 13-valent conjugate (PCV13) vaccine. When indicated, a person who is uncertain of his immunization history and has no record of immunization should receive the PCV13 vaccine. All adults 50 years of age and older should receive this  vaccine. An adult aged 23 years or older who has certain medical conditions and has not been previously immunized should receive 1 dose of PCV13 vaccine. This PCV13 should be followed with a dose of pneumococcal polysaccharide (PPSV23) vaccine. Adults who are at high risk for pneumococcal disease should obtain the PPSV23 vaccine at least 8 weeks after the dose of PCV13 vaccine. Adults older than 68 years of age who have normal immune system function should obtain the PPSV23 vaccine dose at least 1 year after the dose of PCV13 vaccine.  Pneumococcal polysaccharide (PPSV23) vaccine. When PCV13 is also indicated, PCV13 should be obtained first. All adults aged 34 years and older should be immunized. An adult younger than age 95 years who has certain medical conditions should be immunized. Any person who resides in a nursing home or long-term care facility should be immunized. An adult smoker should be immunized. People with an immunocompromised condition and certain other conditions should receive both PCV13 and PPSV23 vaccines. People with human immunodeficiency virus (HIV) infection should be immunized as soon as possible after diagnosis. Immunization during chemotherapy or radiation therapy should be avoided. Routine use of PPSV23 vaccine is not recommended for American Indians, [place]atives, or people younger than 65 years unless there are medical conditions that require PPSV23 vaccine. When indicated, people who have unknown immunization and have no record of immunization should receive PPSV23 vaccine. One-time revaccination 5 years after the first dose of PPSV23 is recommended for people aged 19-64 years who have chronic kidney failure, nephrotic syndrome, asplenia, or immunocompromised conditions. People who received 1-2 doses of PPSV23 before age 75 years should receive another dose of PPSV23 vaccine at age 54 years or later if at least 5 years have passed since the previous dose. Doses of PPSV23 are not  needed for people immunized with PPSV23 at or after age 26 years.  Meningococcal vaccine. Adults with asplenia or persistent complement component deficiencies should receive 2 doses of quadrivalent meningococcal conjugate (MenACWY-D) vaccine. The doses should be obtained  at least 2 months apart. Microbiologists working with certain meningococcal bacteria, [place] recruits, people at risk during an outbreak, and people who travel to or live in countries with a high rate of meningitis should be immunized. A first-year college student up through age 21 years who is living in a residence hall should receive a dose if she did not receive a dose on or after her 16th birthday. Adults who have certain high-risk conditions should receive one or more doses of vaccine.  Hepatitis A vaccine. Adults who wish to be protected from this disease, have certain high-risk conditions, work with hepatitis A-infected animals, work in hepatitis A research labs, or travel to or work in countries with a high rate of hepatitis A should be immunized. Adults who were previously unvaccinated and who anticipate close contact with an international adoptee during the first 60 days after arrival in the [place] from a country with a high rate of hepatitis A should be immunized.  Hepatitis B vaccine. Adults who wish to be protected from this disease, have certain high-risk conditions, may be exposed to blood or other infectious body fluids, are household contacts or sex partners of hepatitis B positive people, are clients or workers in certain care facilities, or travel to or work in countries with a high rate of hepatitis B should be immunized.  Haemophilus influenzae type b (Hib) vaccine. A previously unvaccinated person with asplenia or sickle cell disease or having a scheduled splenectomy should receive 1 dose of Hib vaccine. Regardless of previous immunization, a recipient of a hematopoietic stem cell transplant should receive a  3-dose series 6-12 months after her successful transplant. Hib vaccine is not recommended for adults with HIV infection. Preventive Services / Frequency Ages 104 to 19 years  Blood pressure check.** / Every 3-5 years.  Lipid and cholesterol check.** / Every 5 years beginning at age 43.  Clinical breast exam.** / Every 3 years for women in their 77s and 58s.  BRCA-related cancer risk assessment.** / For women who have family members with a BRCA-related cancer (breast, ovarian, tubal, or peritoneal cancers).  Pap test.** / Every 2 years from ages 20 through 49. Every 3 years starting at age 15 through age 79 or 15 with a history of 3 consecutive normal Pap tests.  HPV screening.** / Every 3 years from ages 38 through ages 57 to 46 with a history of 3 consecutive normal Pap tests.  Hepatitis C blood test.** / For any individual with known risks for hepatitis C.  Skin self-exam. / Monthly.  Influenza vaccine. / Every year.  Tetanus, diphtheria, and acellular pertussis (Tdap, Td) vaccine.** / Consult your health care provider. Pregnant women should receive 1 dose of Tdap vaccine during each pregnancy. 1 dose of Td every 10 years.  Varicella vaccine.** / Consult your health care provider. Pregnant females who do not have evidence of immunity should receive the first dose after pregnancy.  HPV vaccine. / 3 doses over 6 months, if 32 and younger. The vaccine is not recommended for use in pregnant females. However, pregnancy testing is not needed before receiving a dose.  Measles, mumps, rubella (MMR) vaccine.** / You need at least 1 dose of MMR if you were born in 1957 or later. You may also need a 2nd dose. For females of childbearing age, rubella immunity should be determined. If there is no evidence of immunity, females who are not pregnant should be vaccinated. If there is no evidence of immunity, females who are  pregnant should delay immunization until after pregnancy.  Pneumococcal  13-valent conjugate (PCV13) vaccine.** / Consult your health care provider.  Pneumococcal polysaccharide (PPSV23) vaccine.** / 1 to 2 doses if you smoke cigarettes or if you have certain conditions.  Meningococcal vaccine.** / 1 dose if you are age 36 to 38 years and a Market researcher living in a residence hall, or have one of several medical conditions, you need to get vaccinated against meningococcal disease. You may also need additional booster doses.  Hepatitis A vaccine.** / Consult your health care provider.  Hepatitis B vaccine.** / Consult your health care provider.  Haemophilus influenzae type b (Hib) vaccine.** / Consult your health care provider. Ages 50 to 5 years  Blood pressure check.** / Every year.  Lipid and cholesterol check.** / Every 5 years beginning at age 49 years.  Lung cancer screening. / Every year if you are aged 83-80 years and have a 30-pack-year history of smoking and currently smoke or have quit within the past 15 years. Yearly screening is stopped once you have quit smoking for at least 15 years or develop a health problem that would prevent you from having lung cancer treatment.  Clinical breast exam.** / Every year after age 60 years.  BRCA-related cancer risk assessment.** / For women who have family members with a BRCA-related cancer (breast, ovarian, tubal, or peritoneal cancers).  Mammogram.** / Every year beginning at age 53 years and continuing for as long as you are in good health. Consult with your health care provider.  Pap test.** / Every 3 years starting at age 63 years through age 63 or 28 years with a history of 3 consecutive normal Pap tests.  HPV screening.** / Every 3 years from ages 20 years through ages 27 to 54 years with a history of 3 consecutive normal Pap tests.  Fecal occult blood test (FOBT) of stool. / Every year beginning at age 64 years and continuing until age 40 years. You may not need to do this test if you get  a colonoscopy every 10 years.  Flexible sigmoidoscopy or colonoscopy.** / Every 5 years for a flexible sigmoidoscopy or every 10 years for a colonoscopy beginning at age 9 years and continuing until age 26 years.  Hepatitis C blood test.** / For all people born from [birth year] through 1963 and any individual with known risks for hepatitis C.  Skin self-exam. / Monthly.  Influenza vaccine. / Every year.  Tetanus, diphtheria, and acellular pertussis (Tdap/Td) vaccine.** / Consult your health care provider. Pregnant women should receive 1 dose of Tdap vaccine during each pregnancy. 1 dose of Td every 10 years.  Varicella vaccine.** / Consult your health care provider. Pregnant females who do not have evidence of immunity should receive the first dose after pregnancy.  Zoster vaccine.** / 1 dose for adults aged 43 years or older.  Measles, mumps, rubella (MMR) vaccine.** / You need at least 1 dose of MMR if you were born in 1957 or later. You may also need a second dose. For females of childbearing age, rubella immunity should be determined. If there is no evidence of immunity, females who are not pregnant should be vaccinated. If there is no evidence of immunity, females who are pregnant should delay immunization until after pregnancy.  Pneumococcal 13-valent conjugate (PCV13) vaccine.** / Consult your health care provider.  Pneumococcal polysaccharide (PPSV23) vaccine.** / 1 to 2 doses if you smoke cigarettes or if you have certain conditions.  Meningococcal vaccine.** /  Consult your health care provider.  Hepatitis A vaccine.** / Consult your health care provider.  Hepatitis B vaccine.** / Consult your health care provider.  Haemophilus influenzae type b (Hib) vaccine.** / Consult your health care provider. Ages 64 years and over  Blood pressure check.** / Every year.  Lipid and cholesterol check.** / Every 5 years beginning at age 23 years.  Lung cancer screening. / Every year if you  are aged 16-80 years and have a 30-pack-year history of smoking and currently smoke or have quit within the past 15 years. Yearly screening is stopped once you have quit smoking for at least 15 years or develop a health problem that would prevent you from having lung cancer treatment.  Clinical breast exam.** / Every year after age 74 years.  BRCA-related cancer risk assessment.** / For women who have family members with a BRCA-related cancer (breast, ovarian, tubal, or peritoneal cancers).  Mammogram.** / Every year beginning at age 44 years and continuing for as long as you are in good health. Consult with your health care provider.  Pap test.** / Every 3 years starting at age 58 years through age 22 or 39 years with 3 consecutive normal Pap tests. Testing can be stopped between 65 and 70 years with 3 consecutive normal Pap tests and no abnormal Pap or HPV tests in the past 10 years.  HPV screening.** / Every 3 years from ages 64 years through ages 70 or 61 years with a history of 3 consecutive normal Pap tests. Testing can be stopped between 65 and 70 years with 3 consecutive normal Pap tests and no abnormal Pap or HPV tests in the past 10 years.  Fecal occult blood test (FOBT) of stool. / Every year beginning at age 40 years and continuing until age 27 years. You may not need to do this test if you get a colonoscopy every 10 years.  Flexible sigmoidoscopy or colonoscopy.** / Every 5 years for a flexible sigmoidoscopy or every 10 years for a colonoscopy beginning at age 7 years and continuing until age 32 years.  Hepatitis C blood test.** / For all people born from [birth year] through 1963 and any individual with known risks for hepatitis C.  Osteoporosis screening.** / A one-time screening for women ages 30 years and over and women at risk for fractures or osteoporosis.  Skin self-exam. / Monthly.  Influenza vaccine. / Every year.  Tetanus, diphtheria, and acellular pertussis (Tdap/Td)  vaccine.** / 1 dose of Td every 10 years.  Varicella vaccine.** / Consult your health care provider.  Zoster vaccine.** / 1 dose for adults aged 35 years or older.  Pneumococcal 13-valent conjugate (PCV13) vaccine.** / Consult your health care provider.  Pneumococcal polysaccharide (PPSV23) vaccine.** / 1 dose for all adults aged 46 years and older.  Meningococcal vaccine.** / Consult your health care provider.  Hepatitis A vaccine.** / Consult your health care provider.  Hepatitis B vaccine.** / Consult your health care provider.  Haemophilus influenzae type b (Hib) vaccine.** / Consult your health care provider. ** Family history and personal history of risk and conditions may change your health care provider's recommendations.   This information is not intended to replace advice given to you by your health care provider. Make sure you discuss any questions you have with your health care provider.   Document Released: 05/21/2001 Document Revised: 04/15/2014 Document Reviewed: 08/20/2010 Elsevier Interactive Patient Education [employer].

## 2016-02-19 NOTE — Progress Notes (Signed)
Pre visit review using our clinic review tool, if applicable. No additional management support is needed unless otherwise documented below in the visit note. 

## 2016-02-19 NOTE — Progress Notes (Signed)
Subjective:   Brittany [name] is a 70 y.o. female who presents for Medicare Annual (Subsequent) preventive examination.  Review of Systems:   Review of Systems  Constitutional: Negative for activity change, appetite change and fatigue.  HENT: Negative for hearing loss, congestion, tinnitus and ear discharge.   Eyes: Negative for visual disturbance (see optho q1y -- vision corrected to 20/20 with glasses).  Respiratory: Negative for cough, chest tightness and shortness of breath.   Cardiovascular: Negative for chest pain, palpitations and leg swelling.  Gastrointestinal: Negative for abdominal pain, diarrhea, constipation and abdominal distention.  Genitourinary: Negative for urgency, frequency, decreased urine volume and difficulty urinating.  Musculoskeletal: Negative for back pain, arthralgias and gait problem.  Skin: Negative for color change, pallor and rash.  Neurological: Negative for dizziness, light-headedness, numbness and headaches.  Hematological: Negative for adenopathy. Does not bruise/bleed easily.  Psychiatric/Behavioral: Negative for suicidal ideas, confusion, sleep disturbance, self-injury, dysphoric mood, decreased concentration and agitation.  Pt is able to read and write and can do all ADLs No risk for falling No abuse/ violence in home         Objective:     Vitals: BP 120/60 (BP Location: Left Arm, Patient Position: Sitting, Cuff Size: Normal)   Pulse (!) 57   Temp 98.3 F (36.8 C) (Oral)   Resp 16   Ht 5\' 9"  (1.753 m)   Wt 180 lb 3.2 oz (81.7 kg)   SpO2 97%   BMI 26.61 kg/m   Body mass index is 26.61 kg/m. BP 120/60 (BP Location: Left Arm, Patient Position: Sitting, Cuff Size: Normal)   Pulse (!) 57   Temp 98.3 F (36.8 C) (Oral)   Resp 16   Ht 5\' 9"  (1.753 m)   Wt 180 lb 3.2 oz (81.7 kg)   SpO2 97%   BMI 26.61 kg/m  General appearance: alert, cooperative, appears stated age and no distress Head: Normocephalic, without obvious abnormality,  atraumatic Eyes: conjunctivae/corneas clear. PERRL, EOM's intact. Fundi benign. Ears: normal TM's and external ear canals both ears Nose: Nares normal. Septum midline. Mucosa normal. No drainage or sinus tenderness. Throat: lips, mucosa, and tongue normal; teeth and gums normal Neck: no adenopathy, no carotid bruit, no JVD, supple, symmetrical, trachea midline and thyroid not enlarged, symmetric, no tenderness/mass/nodules Back: symmetric, no curvature. ROM normal. No CVA tenderness. Lungs: clear to auscultation bilaterally Breasts: normal appearance, no masses or tenderness Heart: regular rate and rhythm, S1, S2 normal, no murmur, click, rub or gallop Abdomen: soft, non-tender; bowel sounds normal; no masses,  no organomegaly Pelvic: not indicated; post-menopausal, no abnormal Pap smears in past Extremities: extremities normal, atraumatic, no cyanosis or edema Pulses: 2+ and symmetric Skin: Skin color, texture, turgor normal. No rashes or lesions Lymph nodes: Cervical, supraclavicular, and axillary nodes normal. Neurologic: Alert and oriented X 3, normal strength and tone. Normal symmetric reflexes. Normal coordination and gait Psych-- not depressed  Tobacco History  Smoking Status  . Never Smoker  Smokeless Tobacco  . Never Used     Counseling given: Not Answered   Past Medical History:  Diagnosis Date  . Diabetes mellitus   . GERD (gastroesophageal reflux disease)   . Hypertension   . Neuropathy [facility])    Past Surgical History:  Procedure Laterality Date  . LUMBAR FUSION     Family History  Problem Relation Age of Onset  . Cancer Mother     breast  . Depression Mother     bipolar  . Heart disease  Father 60    MI  . Diabetes Brother   . Kidney disease Brother   . Hypertension Brother   . Hyperlipidemia Brother   . Stroke Brother   . Coronary artery disease    . Diabetes     History  Sexual Activity  . Sexual activity: Not Currently  . Partners: Male     Outpatient Encounter Prescriptions as of 02/19/2016  Medication Sig  . amLODipine (NORVASC) 5 MG tablet Take 1 tablet (5 mg total) by mouth daily.  [name] aspirin 81 MG tablet Take 81 mg by mouth daily.  [name] atorvastatin (LIPITOR) 10 MG tablet Take 1 tablet (10 mg total) by mouth daily.  . bisoprolol-hydrochlorothiazide (ZIAC) 10-6.25 MG tablet Take 1 tablet by mouth daily.  . calcium carbonate (TUMS EX) 750 MG chewable tablet Chew 1 tablet by mouth 4 (four) times daily as needed. For acid reflux  . Cholecalciferol (VITAMIN D) 1000 UNITS capsule Take 1,000 Units by mouth daily.  [name] docusate sodium (COLACE) 100 MG capsule Take 100 mg by mouth 2 (two) times daily.  [name] doxepin (SINEQUAN) 100 MG capsule Take 1 capsule (100 mg total) by mouth as needed.  [name] escitalopram (LEXAPRO) 10 MG tablet Take 1 tablet (10 mg total) by mouth daily. Office visit due now  . estradiol (ESTRACE) 1 MG tablet Take 1 tablet (1 mg total) by mouth daily.  . folic acid (FOLVITE) Q000111Q MCG tablet Take 400 mcg by mouth daily.  . furosemide (LASIX) 20 MG tablet Take 1 tablet (20 mg total) by mouth daily.  [name] gabapentin (NEURONTIN) 800 MG tablet Take 1 tablet (800 mg total) by mouth 4 (four) times daily.  [name] glucose blood (ONE TOUCH ULTRA TEST) test strip Check Blood sugar once daily  . l-methylfolate-B6-B12 (METANX) 3-35-2 MG TABS Take 1 tablet by mouth daily.  [name] loratadine (CLARITIN) 10 MG tablet Take 10 mg by mouth daily as needed. For allergies  . metFORMIN (GLUCOPHAGE-XR) 500 MG 24 hr tablet TAKE ONE TABLET BY MOUTH IN THE MORNING AND THEN TWO TABS IN THE EVENING  . morphine (MS CONTIN) 60 MG 12 hr tablet Take 60 mg by mouth 3 (three) times daily.  . Multiple Vitamin (MULTIVITAMIN WITH MINERALS) TABS Take 1 tablet by mouth daily.  [name] omega-3 acid ethyl esters (LOVAZA) 1 G capsule Take 1 g by mouth daily.  [name] omeprazole (PRILOSEC) 40 MG capsule TAKE ONE CAPSULE BY MOUTH ONCE DAILY  . ONETOUCH DELICA LANCETS [id number] MISC Check blood  sugar once daily  . OVER THE COUNTER MEDICATION Take 1 tablet by mouth daily. Hair, skin, and nails vitamin  . tiZANidine (ZANAFLEX) 4 MG tablet Take 1 tablet by mouth 3 (three) times daily.   . [DISCONTINUED] amLODipine (NORVASC) 5 MG tablet TAKE ONE TABLET BY MOUTH ONCE DAILY  . [DISCONTINUED] atorvastatin (LIPITOR) 10 MG tablet TAKE ONE TABLET BY MOUTH ONCE DAILY  . [DISCONTINUED] bisoprolol-hydrochlorothiazide (ZIAC) 10-6.25 MG tablet TAKE ONE TABLET BY MOUTH ONCE DAILY  . [DISCONTINUED] escitalopram (LEXAPRO) 10 MG tablet Take 1 tablet (10 mg total) by mouth daily. Office visit due now  . [DISCONTINUED] furosemide (LASIX) 20 MG tablet TAKE ONE TABLET BY MOUTH ONCE DAILY  . [DISCONTINUED] metFORMIN (GLUCOPHAGE-XR) 500 MG 24 hr tablet TAKE ONE TABLET BY MOUTH IN THE MORNING AND THEN TWO TABS IN THE EVENING   No facility-administered encounter medications on file as of 02/19/2016.     Activities of Daily Living In your present state of health, do you have  any difficulty performing the following activities: 02/19/2016  Hearing? N  Vision? N  Difficulty concentrating or making decisions? Y  Walking or climbing stairs? Y  Dressing or bathing? N  Doing errands, shopping? N  Some recent data might be hidden    Patient Care Team: Ann Held, DO as PCP - General Nicholaus Bloom, MD as Consulting Physician (Neurology) Milus Banister, MD as Attending Physician (Gastroenterology)    Assessment:    cpe Exercise Activities and Dietary recommendations Current Exercise Habits: The patient does not participate in regular exercise at present, Exercise limited by: orthopedic condition(s)  Goals    . Increase physical activity          [facility]      . Pt states she would like to sleep at least 7 hours of sleep per night.  (pt-stated)    . Reduce sugar intake          Pt would like to decrease sweet intake- eating 1 sweet 3-4 days out of the week.        Fall  Risk Fall Risk  02/19/2016 12/07/2014 09/26/2014 07/02/2013 05/14/2013  Falls in the past year? Yes Yes Yes No Yes  Number falls in past yr: 2 or more 2 or more 2 or more - 1  Injury with Fall? No No No - No  Risk Factor Category  - High Fall Risk - - -  Risk for fall due to : - Medication side effect;Impaired balance/gait;Impaired mobility Other (Comment) - -  Risk for fall due to (comments): - neuropathy in feet tripped coming in the door  - -  Follow up - Education provided Follow up appointment - -   Depression Screen PHQ 2/9 Scores 02/19/2016 12/07/2014 09/26/2014 07/02/2013  PHQ - 2 Score 0 1 1 0     Cognitive Function MMSE - Mini Mental State Exam 02/19/2016 12/07/2014  Orientation to time 5 5  Orientation to Place 5 5  Registration 3 3  Attention/ Calculation 5 5  Recall 3 3  Language- name 2 objects 2 2  Language- repeat 1 1  Language- follow 3 step command 3 3  Language- read & follow direction 1 1  Write a sentence 1 1  Copy design 1 1  Total score 30 30        Immunization History  Administered Date(s) Administered  . DTP 07/14/2002  . Influenza Whole 02/12/2007  . Influenza, High Dose Seasonal PF 02/11/2014, 02/10/2015, 02/10/2015  . Influenza,inj,Quad PF,36+ Mos 05/14/2013, 02/19/2016  . Pneumococcal Conjugate-13 05/04/2015  . Pneumococcal Polysaccharide-23 05/14/2013  . Zoster 01/02/2010   Screening Tests Health Maintenance  Topic Date Due  . OPHTHALMOLOGY EXAM  12/15/2014  . URINE MICROALBUMIN  02/12/2015  . MAMMOGRAM  10/11/2015  . HEMOGLOBIN A1C  11/05/2015  . TETANUS/TDAP  05/21/2016 (Originally 03/25/1965)  . FOOT EXAM  05/03/2016  . COLONOSCOPY  06/11/2020  . INFLUENZA VACCINE  Completed  . DEXA SCAN  Completed  . ZOSTAVAX  Completed  . Hepatitis C Screening  Completed  . PNA vac Low Risk Adult  Completed      Plan:   see AVS During the course of the visit the patient was educated and counseled about the following appropriate screening and  preventive services:   Vaccines to include Pneumoccal, Influenza, Hepatitis B, Td, Zostavax, HCV  Electrocardiogram  Cardiovascular Disease  Colorectal cancer screening  Bone density screening  Diabetes screening  Glaucoma screening  Mammography/PAP  Nutrition counseling   Patient Instructions (the written plan) was given to the patient.   1. DM (diabetes mellitus) type II uncontrolled, periph vascular disorder (HCC) Check labs - metFORMIN (GLUCOPHAGE-XR) 500 MG 24 hr tablet; TAKE ONE TABLET BY MOUTH IN THE MORNING AND THEN TWO TABS IN THE EVENING  Dispense: 270 tablet; Refill: 0 - Comprehensive metabolic panel; Future - Hemoglobin A1c; Future  2. Hyperlipidemia LDL goal <100 Check labs - atorvastatin (LIPITOR) 10 MG tablet; Take 1 tablet (10 mg total) by mouth daily.  Dispense: 90 tablet; Refill: 0 - Comprehensive metabolic panel; Future - Lipid panel; Future  3. Hypertension, unspecified type stable - furosemide (LASIX) 20 MG tablet; Take 1 tablet (20 mg total) by mouth daily.  Dispense: 90 tablet; Refill: 1 - bisoprolol-hydrochlorothiazide (ZIAC) 10-6.25 MG tablet; Take 1 tablet by mouth daily.  Dispense: 90 tablet; Refill: 0 - amLODipine (NORVASC) 5 MG tablet; Take 1 tablet (5 mg total) by mouth daily.  Dispense: 90 tablet; Refill: 1 - Comprehensive metabolic panel; Future - CBC with Differential/Platelet; Future - POCT urinalysis dipstick; Future  4. Generalized anxiety disorder  - escitalopram (LEXAPRO) 10 MG tablet; Take 1 tablet (10 mg total) by mouth daily. Office visit due now  Dispense: 90 tablet; Refill: 0  5. Estrogen deficiency  - DG Bone Density; Future  6. Breast cancer screening  - MM DIGITAL SCREENING BILATERAL; Future  7. Preventative health care   8. Encounter for Medicare annual wellness exam See above  9. Encounter for immunization  - Flu Vaccine QUAD 36+ mos IM  Ann Held, DO  02/25/2016

## 2016-02-21 DIAGNOSIS — E1142 Type 2 diabetes mellitus with diabetic polyneuropathy: Secondary | ICD-10-CM | POA: Diagnosis not present

## 2016-02-21 DIAGNOSIS — G4700 Insomnia, unspecified: Secondary | ICD-10-CM | POA: Diagnosis not present

## 2016-02-21 DIAGNOSIS — Z79891 Long term (current) use of opiate analgesic: Secondary | ICD-10-CM | POA: Diagnosis not present

## 2016-02-21 DIAGNOSIS — G894 Chronic pain syndrome: Secondary | ICD-10-CM | POA: Diagnosis not present

## 2016-02-25 ENCOUNTER — Encounter: Payer: Self-pay | Admitting: Family Medicine

## 2016-03-10 ENCOUNTER — Other Ambulatory Visit: Payer: Self-pay | Admitting: Family Medicine

## 2016-03-11 ENCOUNTER — Ambulatory Visit (HOSPITAL_BASED_OUTPATIENT_CLINIC_OR_DEPARTMENT_OTHER)
Admission: RE | Admit: 2016-03-11 | Discharge: 2016-03-11 | Disposition: A | Discharge status: Home / Self Care | Payer: Medicare HMO | Source: Ambulatory Visit | Attending: Family Medicine | Admitting: Family Medicine

## 2016-03-11 ENCOUNTER — Other Ambulatory Visit (INDEPENDENT_AMBULATORY_CARE_PROVIDER_SITE_OTHER): Payer: Medicare HMO

## 2016-03-11 DIAGNOSIS — Z1231 Encounter for screening mammogram for malignant neoplasm of breast: Secondary | ICD-10-CM | POA: Diagnosis not present

## 2016-03-11 DIAGNOSIS — IMO0002 Reserved for concepts with insufficient information to code with codable children: Secondary | ICD-10-CM

## 2016-03-11 DIAGNOSIS — E1165 Type 2 diabetes mellitus with hyperglycemia: Secondary | ICD-10-CM | POA: Diagnosis not present

## 2016-03-11 DIAGNOSIS — R8299 Other abnormal findings in urine: Secondary | ICD-10-CM | POA: Diagnosis not present

## 2016-03-11 DIAGNOSIS — R319 Hematuria, unspecified: Secondary | ICD-10-CM | POA: Diagnosis not present

## 2016-03-11 DIAGNOSIS — I10 Essential (primary) hypertension: Secondary | ICD-10-CM

## 2016-03-11 DIAGNOSIS — E785 Hyperlipidemia, unspecified: Secondary | ICD-10-CM

## 2016-03-11 DIAGNOSIS — E1151 Type 2 diabetes mellitus with diabetic peripheral angiopathy without gangrene: Secondary | ICD-10-CM | POA: Diagnosis not present

## 2016-03-11 DIAGNOSIS — Z1239 Encounter for other screening for malignant neoplasm of breast: Secondary | ICD-10-CM

## 2016-03-11 DIAGNOSIS — R8290 Unspecified abnormal findings in urine: Secondary | ICD-10-CM

## 2016-03-11 DIAGNOSIS — R82998 Other abnormal findings in urine: Secondary | ICD-10-CM

## 2016-03-11 DIAGNOSIS — Z780 Asymptomatic menopausal state: Secondary | ICD-10-CM | POA: Diagnosis not present

## 2016-03-11 DIAGNOSIS — E2839 Other primary ovarian failure: Secondary | ICD-10-CM

## 2016-03-11 LAB — POC URINALSYSI DIPSTICK (AUTOMATED)
Bilirubin, UA: NEGATIVE
GLUCOSE UA: NEGATIVE
Ketones, UA: NEGATIVE
Nitrite, UA: NEGATIVE
PH UA: 5.5
Protein, UA: NEGATIVE
UROBILINOGEN UA: 0.2

## 2016-03-11 LAB — CBC WITH DIFFERENTIAL/PLATELET
BASOS ABS: 0 10*3/uL (ref 0.0–0.1)
BASOS PCT: 0.5 % (ref 0.0–3.0)
EOS ABS: 0.1 10*3/uL (ref 0.0–0.7)
Eosinophils Relative: 1.5 % (ref 0.0–5.0)
HEMATOCRIT: 39.6 % (ref 36.0–46.0)
Hemoglobin: 13.2 g/dL (ref 12.0–15.0)
LYMPHS ABS: 1.4 10*3/uL (ref 0.7–4.0)
Lymphocytes Relative: 18.3 % (ref 12.0–46.0)
MCHC: 33.4 g/dL (ref 30.0–36.0)
MCV: 84.6 fl (ref 78.0–100.0)
MONO ABS: 0.6 10*3/uL (ref 0.1–1.0)
Monocytes Relative: 7.6 % (ref 3.0–12.0)
NEUTROS ABS: 5.4 10*3/uL (ref 1.4–7.7)
NEUTROS PCT: 72.1 % (ref 43.0–77.0)
PLATELETS: 221 10*3/uL (ref 150.0–400.0)
RBC: 4.68 Mil/uL (ref 3.87–5.11)
RDW: 13.3 % (ref 11.5–15.5)
WBC: 7.5 10*3/uL (ref 4.0–10.5)

## 2016-03-11 LAB — COMPREHENSIVE METABOLIC PANEL
ALK PHOS: 53 U/L (ref 39–117)
ALT: 16 U/L (ref 0–35)
AST: 17 U/L (ref 0–37)
Albumin: 3.9 g/dL (ref 3.5–5.2)
BILIRUBIN TOTAL: 0.3 mg/dL (ref 0.2–1.2)
BUN: 12 mg/dL (ref 6–23)
CO2: 35 meq/L — AB (ref 19–32)
CREATININE: 0.67 mg/dL (ref 0.40–1.20)
Calcium: 9.3 mg/dL (ref 8.4–10.5)
Chloride: 102 mEq/L (ref 96–112)
GFR: 93.04 mL/min (ref >60.00)
GLUCOSE: 130 mg/dL — AB (ref 70–99)
Potassium: 4.2 mEq/L (ref 3.5–5.1)
SODIUM: 143 meq/L (ref 135–145)
TOTAL PROTEIN: 6.9 g/dL (ref 6.0–8.3)

## 2016-03-11 LAB — LIPID PANEL
CHOL/HDL RATIO: 2
Cholesterol: 167 mg/dL (ref 0–200)
HDL: 75.8 mg/dL (ref >39.00)
LDL Cholesterol: 75 mg/dL (ref 0–99)
NONHDL: 91.49
Triglycerides: 81 mg/dL (ref 0.0–149.0)
VLDL: 16.2 mg/dL (ref 0.0–40.0)

## 2016-03-11 LAB — HEMOGLOBIN A1C: Hgb A1c MFr Bld: 6.7 % — ABNORMAL HIGH (ref 4.6–6.5)

## 2016-03-11 NOTE — Telephone Encounter (Signed)
Rx request Denied; Last Rx to pharmacy 02/19/16, #90  - Too Soon for refill request/SLS 12/04

## 2016-03-11 NOTE — Addendum Note (Signed)
Addended by: Harl Bowie on: 03/11/2016 10:30 AM   Modules accepted: Orders

## 2016-03-12 LAB — URINE CULTURE

## 2016-03-15 ENCOUNTER — Other Ambulatory Visit: Payer: Self-pay | Admitting: Family Medicine

## 2016-03-15 NOTE — Telephone Encounter (Signed)
Relation to [id number] Call back [place]:  [place], [address] [place] [phone] (Phone) [phone] (Fax)    Reason for call:  patient states pharmacy never received atorvastatin (LIPITOR) 10 MG tablet and furosemide (LASIX) 20 MG tablet, please advise pharmacy directly

## 2016-03-20 ENCOUNTER — Other Ambulatory Visit: Payer: Self-pay

## 2016-03-20 DIAGNOSIS — E785 Hyperlipidemia, unspecified: Secondary | ICD-10-CM

## 2016-03-20 DIAGNOSIS — I10 Essential (primary) hypertension: Secondary | ICD-10-CM

## 2016-03-20 MED ORDER — FUROSEMIDE 20 MG PO TABS: 20.0000 mg | ORAL_TABLET | Freq: Every day | ORAL | 1 refills | Status: DC

## 2016-03-20 MED ORDER — ATORVASTATIN CALCIUM 10 MG PO TABS: 10.0000 mg | ORAL_TABLET | Freq: Every day | ORAL | 0 refills | Status: DC

## 2016-03-20 NOTE — Telephone Encounter (Signed)
Rx sent to pharmacy. LB 

## 2016-03-21 ENCOUNTER — Other Ambulatory Visit: Payer: Self-pay | Admitting: Family Medicine

## 2016-03-21 NOTE — Telephone Encounter (Signed)
Pharmacy states again they have not received refill

## 2016-03-22 ENCOUNTER — Other Ambulatory Visit: Payer: Self-pay | Admitting: *Deleted

## 2016-03-22 DIAGNOSIS — I10 Essential (primary) hypertension: Secondary | ICD-10-CM

## 2016-03-22 DIAGNOSIS — E785 Hyperlipidemia, unspecified: Secondary | ICD-10-CM

## 2016-03-22 MED ORDER — ATORVASTATIN CALCIUM 10 MG PO TABS: 10.0000 mg | ORAL_TABLET | Freq: Every day | ORAL | 0 refills | Status: DC

## 2016-03-22 MED ORDER — FUROSEMIDE 20 MG PO TABS: 20.0000 mg | ORAL_TABLET | Freq: Every day | ORAL | 1 refills | Status: DC

## 2016-03-22 NOTE — Telephone Encounter (Signed)
Spoke with pharmacy and the Lipitor and Lasix had been sent to the pharmacy two times and the duplicate Rx was cancelled.   Attempted to reach the patient and the home number does not have a VM. I was attempting to reach the patient and notify her that the Lipitor and Lasix has been sent to the pharmacy.

## 2016-04-03 ENCOUNTER — Telehealth: Payer: Self-pay | Admitting: Family Medicine

## 2016-04-03 DIAGNOSIS — I10 Essential (primary) hypertension: Secondary | ICD-10-CM

## 2016-04-03 NOTE — Telephone Encounter (Signed)
Relation to [id number] Call back [place]: [facility] Order phone [phone] fax # [phone]  Reason for call:  Patient requesting all medication please send to [facility] mail order phone # [phone] fax # [phone]

## 2016-04-04 ENCOUNTER — Other Ambulatory Visit: Payer: Self-pay

## 2016-04-04 DIAGNOSIS — I10 Essential (primary) hypertension: Secondary | ICD-10-CM

## 2016-04-04 MED ORDER — BISOPROLOL-HYDROCHLOROTHIAZIDE 10-6.25 MG PO TABS: 1.0000 | ORAL_TABLET | Freq: Every day | ORAL | 0 refills | Status: DC

## 2016-04-04 MED ORDER — AMLODIPINE BESYLATE 5 MG PO TABS: 5.0000 mg | ORAL_TABLET | Freq: Every day | ORAL | 1 refills | Status: DC

## 2016-04-04 NOTE — Telephone Encounter (Signed)
New pharmacy entered, per patient's request. LB

## 2016-04-11 MED ORDER — AMLODIPINE BESYLATE 5 MG PO TABS: 5.0000 mg | ORAL_TABLET | Freq: Every day | ORAL | 1 refills | Status: DC

## 2016-04-11 NOTE — Telephone Encounter (Signed)
Rx sent to [facility], per pt's request. LB

## 2016-04-11 NOTE — Telephone Encounter (Signed)
Pt called back in, she says that she would like to have her Rx for bisoprolol sent via mail order also.

## 2016-04-11 NOTE — Addendum Note (Signed)
Addended by: Marjory Lies on: 04/11/2016 03:46 PM   Modules accepted: Orders

## 2016-04-11 NOTE — Telephone Encounter (Signed)
Patient would like to have amLODipine (NORVASC) 5 MG tablet sent to [place], [place] because she will not get the mail order prescription before she runs out. Please advise.   [id number]

## 2016-04-15 ENCOUNTER — Other Ambulatory Visit: Payer: Self-pay

## 2016-04-15 DIAGNOSIS — I10 Essential (primary) hypertension: Secondary | ICD-10-CM

## 2016-04-15 MED ORDER — AMLODIPINE BESYLATE 5 MG PO TABS: 5.0000 mg | ORAL_TABLET | Freq: Every day | ORAL | 1 refills | Status: DC

## 2016-04-15 NOTE — Telephone Encounter (Signed)
Rx sent to pharmacy of pt's choice. LB

## 2016-04-15 NOTE — Telephone Encounter (Signed)
[place], [place] [phone] (Phone) [phone] (Fax)      Patient states that Mail Order did not receive Rx for amLODipine (NORVASC) 5 MG tablet [id number]

## 2016-04-17 ENCOUNTER — Other Ambulatory Visit: Payer: Self-pay | Admitting: Family Medicine

## 2016-04-17 DIAGNOSIS — I10 Essential (primary) hypertension: Secondary | ICD-10-CM

## 2016-04-17 DIAGNOSIS — E1151 Type 2 diabetes mellitus with diabetic peripheral angiopathy without gangrene: Secondary | ICD-10-CM

## 2016-04-17 DIAGNOSIS — IMO0002 Reserved for concepts with insufficient information to code with codable children: Secondary | ICD-10-CM

## 2016-04-17 DIAGNOSIS — E1165 Type 2 diabetes mellitus with hyperglycemia: Principal | ICD-10-CM

## 2016-04-17 NOTE — Telephone Encounter (Signed)
Patient is requesting refills of bisoprolol-hydrochlorothiazide (ZIAC) 10-6.25 MG tablet and metFORMIN (GLUCOPHAGE-XR) 500 MG 24 hr tablet   Pharmacy: [place], [place]

## 2016-04-17 NOTE — Telephone Encounter (Signed)
Pt called in, She says that [facility] order still hasnt received Rx for amlodipine. Pt says that she is now out and would like to have her Rx sent to her local pharmacy which is:    Pharmacy: [place] on [address]: [phone]

## 2016-04-18 NOTE — Telephone Encounter (Signed)
Relation to [id number] Call back [phone]   Reason for call:  Patient checking on the status of medication refill for the following:  bisoprolol-hydrochlorothiazide (ZIAC) 10-6.25 MG tablet  metFORMIN (GLUCOPHAGE-XR) 500 MG 24 hr tablet  amLODipine (NORVASC) 5 MG tablet   Patient states please send refill request to  [place], [place] [phone] (Phone) [phone] (Fax)   And a few pills please send to retail to hold patient over until mail order is received.   [place], [place] [phone] (Phone) [phone] (Fax)   Please advise patient 3x calling

## 2016-04-19 MED ORDER — BISOPROLOL-HYDROCHLOROTHIAZIDE 10-6.25 MG PO TABS: 1.0000 | ORAL_TABLET | Freq: Every day | ORAL | 0 refills | Status: DC

## 2016-04-19 MED ORDER — BISOPROLOL-HYDROCHLOROTHIAZIDE 10-6.25 MG PO TABS: 1.0000 | ORAL_TABLET | Freq: Every day | ORAL | 1 refills | Status: DC

## 2016-04-19 MED ORDER — METFORMIN HCL ER 500 MG PO TB24: ORAL_TABLET | ORAL | 0 refills | Status: DC

## 2016-04-19 MED ORDER — AMLODIPINE BESYLATE 5 MG PO TABS: 5.0000 mg | ORAL_TABLET | Freq: Every day | ORAL | 0 refills | Status: DC

## 2016-04-19 MED ORDER — AMLODIPINE BESYLATE 5 MG PO TABS: 5.0000 mg | ORAL_TABLET | Freq: Every day | ORAL | 1 refills | Status: DC

## 2016-04-19 NOTE — Addendum Note (Signed)
Addended by: Rudene Anda on: 04/19/2016 04:35 PM   Modules accepted: Orders

## 2016-04-19 NOTE — Telephone Encounter (Signed)
Rx's sent to local pharmacy as well as mail order.  Pt notified and made aware.  No additional needs at this time.

## 2016-04-19 NOTE — Telephone Encounter (Signed)
Patient calling back checking on the status of medication mentioned below,

## 2016-04-29 DIAGNOSIS — G4700 Insomnia, unspecified: Secondary | ICD-10-CM | POA: Diagnosis not present

## 2016-04-29 DIAGNOSIS — Z79891 Long term (current) use of opiate analgesic: Secondary | ICD-10-CM | POA: Diagnosis not present

## 2016-04-29 DIAGNOSIS — G894 Chronic pain syndrome: Secondary | ICD-10-CM | POA: Diagnosis not present

## 2016-04-29 DIAGNOSIS — E1142 Type 2 diabetes mellitus with diabetic polyneuropathy: Secondary | ICD-10-CM | POA: Diagnosis not present

## 2016-06-05 ENCOUNTER — Other Ambulatory Visit: Payer: Self-pay | Admitting: Family Medicine

## 2016-06-05 DIAGNOSIS — E785 Hyperlipidemia, unspecified: Secondary | ICD-10-CM

## 2016-06-05 DIAGNOSIS — Z780 Asymptomatic menopausal state: Secondary | ICD-10-CM

## 2016-06-05 DIAGNOSIS — I10 Essential (primary) hypertension: Secondary | ICD-10-CM

## 2016-06-05 DIAGNOSIS — F411 Generalized anxiety disorder: Secondary | ICD-10-CM

## 2016-06-05 NOTE — Telephone Encounter (Signed)
Patient is requesting a refill of estradiol (ESTRACE) 1 MG tablet, escitalopram (LEXAPRO) 10 MG tablet, omeprazole (PRILOSEC) 40 MG capsule, furosemide (LASIX) 20 MG tablet, atorvastatin (LIPITOR) 10 MG tablet Patient would like a call regarding this refill request as well. Please advise   Pharmacy: [place]

## 2016-06-06 MED ORDER — OMEPRAZOLE 40 MG PO CPDR: 40.0000 mg | DELAYED_RELEASE_CAPSULE | Freq: Every day | ORAL | 1 refills | Status: DC

## 2016-06-06 MED ORDER — ATORVASTATIN CALCIUM 10 MG PO TABS: 10.0000 mg | ORAL_TABLET | Freq: Every day | ORAL | 1 refills | Status: DC

## 2016-06-06 MED ORDER — ESTRADIOL 1 MG PO TABS: 1.0000 mg | ORAL_TABLET | Freq: Every day | ORAL | 1 refills | Status: DC

## 2016-06-06 MED ORDER — ESCITALOPRAM OXALATE 10 MG PO TABS: 10.0000 mg | ORAL_TABLET | Freq: Every day | ORAL | 0 refills | Status: DC

## 2016-06-06 MED ORDER — ATORVASTATIN CALCIUM 10 MG PO TABS: 10.0000 mg | ORAL_TABLET | Freq: Every day | ORAL | 0 refills | Status: DC

## 2016-06-06 MED ORDER — ESCITALOPRAM OXALATE 10 MG PO TABS: 10.0000 mg | ORAL_TABLET | Freq: Every day | ORAL | 1 refills | Status: DC

## 2016-06-06 MED ORDER — FUROSEMIDE 20 MG PO TABS: 20.0000 mg | ORAL_TABLET | Freq: Every day | ORAL | 0 refills | Status: DC

## 2016-06-06 MED ORDER — OMEPRAZOLE 40 MG PO CPDR: 40.0000 mg | DELAYED_RELEASE_CAPSULE | Freq: Every day | ORAL | 0 refills | Status: DC

## 2016-06-06 MED ORDER — ESTRADIOL 1 MG PO TABS: 1.0000 mg | ORAL_TABLET | Freq: Every day | ORAL | 0 refills | Status: DC

## 2016-06-06 MED ORDER — FUROSEMIDE 20 MG PO TABS
20.0000 mg | ORAL_TABLET | Freq: Every day | ORAL | 1 refills | Status: DC
Start: 2016-06-06 — End: 2016-11-19

## 2016-06-06 MED ORDER — FUROSEMIDE 20 MG PO TABS: 20.0000 mg | ORAL_TABLET | Freq: Every day | ORAL | 1 refills | Status: DC

## 2016-06-06 NOTE — Telephone Encounter (Signed)
Spoke to the patient to inform mail order sent in. She also requested all be sent to local #[phone] so she does not run out before mail order.

## 2016-06-19 ENCOUNTER — Telehealth: Payer: Self-pay | Admitting: Family Medicine

## 2016-06-19 DIAGNOSIS — Z780 Asymptomatic menopausal state: Secondary | ICD-10-CM

## 2016-06-19 MED ORDER — ESTRADIOL 1 MG PO TABS: 1.0000 mg | ORAL_TABLET | Freq: Every day | ORAL | 1 refills | Status: DC

## 2016-06-19 NOTE — Telephone Encounter (Signed)
Initiated PA on covermymeds for Estradiol 1 mg. Awaiting response.

## 2016-06-19 NOTE — Telephone Encounter (Signed)
Received PA  Approval letter from [facility] [phone].  Effective from 04/06/2016 through 04/07/2017.  Patient contacted of approval by phone call. Sent in a 90 day refill to [facility] home delivery. She stated she had more that needed refill, but needs to go through her meds and will call back with her list of other meds needing refills.

## 2016-06-20 ENCOUNTER — Other Ambulatory Visit: Payer: Self-pay | Admitting: Family Medicine

## 2016-06-20 DIAGNOSIS — F411 Generalized anxiety disorder: Secondary | ICD-10-CM

## 2016-06-20 MED ORDER — ESCITALOPRAM OXALATE 10 MG PO TABS: 10.0000 mg | ORAL_TABLET | Freq: Every day | ORAL | 0 refills | Status: DC

## 2016-06-20 NOTE — Telephone Encounter (Signed)
Refill done.  

## 2016-06-20 NOTE — Telephone Encounter (Signed)
Caller name: Relationship to patient: Self Can be reached: [phone]  Pharmacy:  [place], [place] [phone] (Phone) [phone] (Fax)     Reason for call: Refill on escitalopram (LEXAPRO) 10 MG tablet

## 2016-07-17 ENCOUNTER — Other Ambulatory Visit: Payer: Self-pay | Admitting: Family Medicine

## 2016-07-17 DIAGNOSIS — IMO0002 Reserved for concepts with insufficient information to code with codable children: Secondary | ICD-10-CM

## 2016-07-17 DIAGNOSIS — E1151 Type 2 diabetes mellitus with diabetic peripheral angiopathy without gangrene: Secondary | ICD-10-CM

## 2016-07-17 DIAGNOSIS — E1165 Type 2 diabetes mellitus with hyperglycemia: Principal | ICD-10-CM

## 2016-08-13 DIAGNOSIS — G4700 Insomnia, unspecified: Secondary | ICD-10-CM | POA: Diagnosis not present

## 2016-08-13 DIAGNOSIS — E1142 Type 2 diabetes mellitus with diabetic polyneuropathy: Secondary | ICD-10-CM | POA: Diagnosis not present

## 2016-08-13 DIAGNOSIS — G894 Chronic pain syndrome: Secondary | ICD-10-CM | POA: Diagnosis not present

## 2016-08-13 DIAGNOSIS — Z79891 Long term (current) use of opiate analgesic: Secondary | ICD-10-CM | POA: Diagnosis not present

## 2016-09-05 ENCOUNTER — Telehealth: Payer: Self-pay | Admitting: Family Medicine

## 2016-09-05 MED ORDER — ATORVASTATIN CALCIUM 10 MG PO TABS: 10.0000 mg | ORAL_TABLET | Freq: Every day | ORAL | 1 refills | Status: DC

## 2016-09-05 MED ORDER — OMEPRAZOLE 40 MG PO CPDR: 40.0000 mg | DELAYED_RELEASE_CAPSULE | Freq: Every day | ORAL | 1 refills | Status: DC

## 2016-09-05 NOTE — Telephone Encounter (Signed)
Please fax refill omeprazole 40 mg and atorvastatin calcium 10 mg per pt request. Pt is on last refill right now.

## 2016-09-05 NOTE — Telephone Encounter (Signed)
Refilled prescriptions requested/patient notified refills done

## 2016-09-12 ENCOUNTER — Other Ambulatory Visit: Payer: Self-pay | Admitting: Family Medicine

## 2016-09-12 DIAGNOSIS — I10 Essential (primary) hypertension: Secondary | ICD-10-CM

## 2016-09-12 MED ORDER — AMLODIPINE BESYLATE 5 MG PO TABS: 5.0000 mg | ORAL_TABLET | Freq: Every day | ORAL | 0 refills | Status: DC

## 2016-09-12 MED ORDER — BISOPROLOL-HYDROCHLOROTHIAZIDE 10-6.25 MG PO TABS: 1.0000 | ORAL_TABLET | Freq: Every day | ORAL | 1 refills | Status: DC

## 2016-09-20 ENCOUNTER — Ambulatory Visit (INDEPENDENT_AMBULATORY_CARE_PROVIDER_SITE_OTHER): Payer: Medicare HMO | Admitting: Family Medicine

## 2016-09-20 VITALS — BP 110/72 | HR 64 | Temp 98.8°F | Ht 69.0 in | Wt 176.6 lb

## 2016-09-20 DIAGNOSIS — I10 Essential (primary) hypertension: Secondary | ICD-10-CM

## 2016-09-20 DIAGNOSIS — E1142 Type 2 diabetes mellitus with diabetic polyneuropathy: Secondary | ICD-10-CM

## 2016-09-20 DIAGNOSIS — E785 Hyperlipidemia, unspecified: Secondary | ICD-10-CM

## 2016-09-20 NOTE — Progress Notes (Signed)
Patient ID: SIDNEY SILBERMAN, female    DOB: 1946/03/26  Age: 71 y.o. MRN: [id number]    Subjective:  Subjective  HPI GERA INBODEN presents for dm, cholesterol and bp.   HPI HYPERTENSION   Blood pressure range-not checking   Chest pain- no      Dyspnea- no Lightheadedness- no   Edema- no  Other side effects - no   Medication compliance: good Low salt diet- yes    DIABETES    Blood Sugar ranges-not checking  Polyuria- no New Visual problems- no  Hypoglycemic symptoms- no  Other side effects-no Medication compliance - good Last eye exam- due Foot exam- today   HYPERLIPIDEMIA  Medication compliance- good RUQ pain- no  Muscle aches- no Other side effects-no   Review of Systems  Constitutional: Negative for fever.  HENT: Negative for congestion.   Respiratory: Negative for shortness of breath.   Cardiovascular: Negative for chest pain, palpitations and leg swelling.  Gastrointestinal: Negative for abdominal pain, blood in stool and nausea.  Genitourinary: Negative for dysuria and frequency.  Skin: Negative for rash.  Allergic/Immunologic: Negative for environmental allergies.  Neurological: Negative for dizziness and headaches.  Psychiatric/Behavioral: The patient is not nervous/anxious.     History Past Medical History:  Diagnosis Date  . Diabetes mellitus   . GERD (gastroesophageal reflux disease)   . Hypertension   . Neuropathy ([place])     She has a past surgical history that includes Lumbar fusion.   Her family history includes Cancer in her mother; Depression in her mother; Diabetes in her brother; Heart disease (age of onset: 76) in her father; Hyperlipidemia in her brother; Hypertension in her brother; Kidney disease in her brother; Stroke in her brother.She reports that she has never smoked. She has never used smokeless tobacco. She reports that she does not drink alcohol or use drugs.  Current Outpatient Prescriptions on File Prior to Visit  Medication  Sig Dispense Refill  . amLODipine (NORVASC) 5 MG tablet Take 1 tablet (5 mg total) by mouth daily. 30 tablet 0  . aspirin 81 MG tablet Take 81 mg by mouth daily.    [name] atorvastatin (LIPITOR) 10 MG tablet Take 1 tablet (10 mg total) by mouth daily. 30 tablet 0  . atorvastatin (LIPITOR) 10 MG tablet Take 1 tablet (10 mg total) by mouth daily. 90 tablet 1  . bisoprolol-hydrochlorothiazide (ZIAC) 10-6.25 MG tablet Take 1 tablet by mouth daily. 90 tablet 1  . calcium carbonate (TUMS EX) 750 MG chewable tablet Chew 1 tablet by mouth 4 (four) times daily as needed. For acid reflux    . Cholecalciferol (VITAMIN D) 1000 UNITS capsule Take 1,000 Units by mouth daily.    [name] docusate sodium (COLACE) 100 MG capsule Take 100 mg by mouth 2 (two) times daily.    [name] escitalopram (LEXAPRO) 10 MG tablet Take 1 tablet (10 mg total) by mouth daily. Office visit due now 90 tablet 0  . estradiol (ESTRACE) 1 MG tablet Take 1 tablet (1 mg total) by mouth daily. 90 tablet 1  . folic acid (FOLVITE) 700 MCG tablet Take 400 mcg by mouth daily.    . furosemide (LASIX) 20 MG tablet Take 1 tablet (20 mg total) by mouth daily. 90 tablet 1  . furosemide (LASIX) 20 MG tablet Take 1 tablet (20 mg total) by mouth daily. 30 tablet 0  . gabapentin (NEURONTIN) 800 MG tablet Take 1 tablet (800 mg total) by mouth 4 (four) times daily.    [name]  glucose blood (ONE TOUCH ULTRA TEST) test strip Check Blood sugar once daily 100 each 12  . l-methylfolate-B6-B12 (METANX) 3-35-2 MG TABS Take 1 tablet by mouth daily.    [name] loratadine (CLARITIN) 10 MG tablet Take 10 mg by mouth daily as needed. For allergies    . metFORMIN (GLUCOPHAGE-XR) 500 MG 24 hr tablet TAKE ONE TABLET BY MOUTH IN THE MORNING AND THEN TWO TABS IN THE EVENING 90 tablet 0  . metFORMIN (GLUCOPHAGE-XR) 500 MG 24 hr tablet TAKE 1 TABLET EVERY MORNINGAND TAKE 2 TABLETS EVERY   EVENING 270 tablet 0  . morphine (MS CONTIN) 60 MG 12 hr tablet Take 60 mg by mouth 3 (three) times daily.    .  Multiple Vitamin (MULTIVITAMIN WITH MINERALS) TABS Take 1 tablet by mouth daily.    [name] omega-3 acid ethyl esters (LOVAZA) 1 G capsule Take 1 g by mouth daily.    [name] omeprazole (PRILOSEC) 40 MG capsule Take 1 capsule (40 mg total) by mouth daily. 90 capsule 1  . ONETOUCH DELICA LANCETS 40J MISC Check blood sugar once daily 100 each 12  . OVER THE COUNTER MEDICATION Take 1 tablet by mouth daily. Hair, skin, and nails vitamin    . tiZANidine (ZANAFLEX) 4 MG tablet Take 1 tablet by mouth 3 (three) times daily.      No current facility-administered medications on file prior to visit.      Objective:  Objective  Physical Exam  Constitutional: She is oriented to person, place, and time. She appears well-developed and well-nourished.  HENT:  Head: Normocephalic and atraumatic.  Eyes: Conjunctivae and EOM are normal.  Neck: Normal range of motion. Neck supple. No JVD present. Carotid bruit is not present. No thyromegaly present.  Cardiovascular: Normal rate, regular rhythm and normal heart sounds.   No murmur heard. Pulmonary/Chest: Effort normal and breath sounds normal. No respiratory distress. She has no wheezes. She has no rales. She exhibits no tenderness.  Musculoskeletal: She exhibits no edema.  Neurological: She is alert and oriented to person, place, and time.  Psychiatric: She has a normal mood and affect.  Nursing note and vitals reviewed.  BP 110/72   Pulse 64   Temp 98.8 F (37.1 C) (Oral)   Ht 5\' 9"  (1.753 m)   Wt 176 lb 9.6 oz (80.1 kg)   SpO2 98%   BMI 26.08 kg/m  Wt Readings from Last 3 Encounters:  09/20/16 176 lb 9.6 oz (80.1 kg)  02/19/16 180 lb 3.2 oz (81.7 kg)  05/04/15 179 lb 9.6 oz (81.5 kg)     Lab Results  Component Value Date   WBC 7.5 03/11/2016   HGB 13.2 03/11/2016   HCT 39.6 03/11/2016   PLT 221.0 03/11/2016   GLUCOSE 130 (H) 03/11/2016   CHOL 167 03/11/2016   TRIG 81.0 03/11/2016   HDL 75.80 03/11/2016   LDLCALC 75 03/11/2016   ALT 16  03/11/2016   AST 17 03/11/2016   NA 143 03/11/2016   K 4.2 03/11/2016   CL 102 03/11/2016   CREATININE 0.67 03/11/2016   BUN 12 03/11/2016   CO2 35 (H) 03/11/2016   TSH 0.46 06/18/2012   HGBA1C 6.7 (H) 03/11/2016   MICROALBUR 0.4 02/11/2014    Dg Bone Density  Result Date: 03/11/2016 EXAM: DUAL X-RAY ABSORPTIOMETRY (DXA) FOR BONE MINERAL DENSITY IMPRESSION: Referring Physician:  Rosalita Chessman CHASE PATIENT: Name: Nikea, Settle Patient ID: [id number] Birth Date: Jun 25, 1945 Height: 68.0 in. Sex: Female Measured: 03/11/2016 Weight:  179.2 lbs. Indications: Caucasian, Diabetic, Estrogen Deficiency, Height Loss, Hysterectomy, Post Menopausal Fractures: Treatments: Calcium, Metformin, Multivitamin, Vitamin D ASSESSMENT: The BMD measured at Femur Neck Left is 1.122 g/cm2 with a T-score of 0.6. This patient is considered normal according to [facility]) criteria. L-3 & 4 was excluded due to degenerative changes. Site Region Measured Date Measured Age WHO YA BMD Classification T-score AP Spine L1-L2 5/11[age] 67.9 Normal 2.9 1.515 g/cm2 DualFemur Neck Left 5/11[age] 67.9 [age] Normal 0.6 1.122 g/cm2 World Health Organization [facility]) criteria for post-menopausal, Caucasian Women: Normal       T-score at or above -1 SD Osteopenia   T-score between -1 and -2.5 SD Osteoporosis T-score at or below -2.5 SD RECOMMENDATION: [place] recommends that FDA-approved medical therapies be considered in postmenopausal women and men age 3 or older with a: 1. Hip or vertebral (clinical or morphometric) fracture. 2. T-score of < -2.5 at the spine or hip. 3. Ten-year fracture probability by FRAX of 3% or greater for hip fracture or 20% or greater for major osteoporotic fracture. All treatment decisions require clinical judgment and consideration of individual patient factors, including patient preferences, co-morbidities, previous drug use, risk factors not captured in the FRAX model  (e.g. falls, vitamin D deficiency, increased bone turnover, interval significant decline in bone density) and possible under - or over-estimation of fracture risk by FRAX. All patients should ensure an adequate intake of dietary calcium (1200 mg/d) and vitamin D (800 IU daily) unless contraindicated. FOLLOW-UP: People with diagnosed cases of osteoporosis or at high risk for fracture should have regular bone mineral density tests. For patients eligible for Medicare, routine testing is allowed once every 2 years. The testing frequency can be increased to one year for patients who have rapidly progressing disease, those who are receiving or discontinuing medical therapy to restore bone mass, or have additional risk factors. I have reviewed this report and agree with the above findings. [facility] Radiology Electronically Signed   By: Kerby Moors M.D.   On: 03/11/2016 10:54   Mm Digital Screening Bilateral  Result Date: 03/11/2016 CLINICAL DATA:  Screening. EXAM: DIGITAL SCREENING BILATERAL MAMMOGRAM WITH CAD COMPARISON:  Previous exam(s). ACR Breast Density Category b: There are scattered areas of fibroglandular density. FINDINGS: There are no findings suspicious for malignancy. Images were processed with CAD. IMPRESSION: No mammographic evidence of malignancy. A result letter of this screening mammogram will be mailed directly to the patient. RECOMMENDATION: Screening mammogram in one year. (Code:SM-B-01Y) BI-RADS CATEGORY  1: Negative. Electronically Signed   By: Margarette Canada M.D.   On: 03/12/2016 09:34     Assessment & Plan:  Plan  I am having Ms. Barrientes maintain her tiZANidine, docusate sodium, calcium carbonate, omega-3 acid ethyl esters, Vitamin D, multivitamin with minerals, loratadine, OVER THE COUNTER MEDICATION, morphine, [id number], folic acid, aspirin, glucose blood, ONETOUCH DELICA LANCETS 08M, gabapentin, metFORMIN, furosemide, furosemide, atorvastatin, estradiol, escitalopram,  metFORMIN, atorvastatin, omeprazole, amLODipine, bisoprolol-hydrochlorothiazide, and doxepin.  Meds ordered this encounter  Medications  . doxepin (SINEQUAN) 25 MG capsule    Sig: Take 1 capsule by mouth as needed.    Problem List Items Addressed This Visit      Unprioritized   Essential hypertension    Well controlled, no changes to meds. Encouraged heart healthy diet such as the DASH diet and exercise as tolerated.        Relevant Orders   Lipid panel   Hemoglobin A1c   Comprehensive metabolic panel   Hyperlipidemia  LDL goal <70    Tolerating statin, encouraged heart healthy diet, avoid trans fats, minimize simple carbs and saturated fats. Increase exercise as tolerated      Relevant Orders   Lipid panel   Hemoglobin A1c   Comprehensive metabolic panel   Type 2 diabetes mellitus with diabetic polyneuropathy, without long-term current use of insulin ([place]) - Primary    hgba1c to be checked, minimize simple carbs. Increase exercise as tolerated. Continue current meds       Relevant Medications   doxepin (SINEQUAN) 25 MG capsule   Other Relevant Orders   Lipid panel   Hemoglobin A1c   Comprehensive metabolic panel   Ambulatory referral to Podiatry      Follow-up: Return in about 6 months (around 03/22/2017) for hypertension, hyperlipidemia, diabetes II.  Ann Held, DO

## 2016-09-20 NOTE — Patient Instructions (Signed)

## 2016-09-22 ENCOUNTER — Encounter: Payer: Self-pay | Admitting: Family Medicine

## 2016-09-22 DIAGNOSIS — E785 Hyperlipidemia, unspecified: Secondary | ICD-10-CM | POA: Insufficient documentation

## 2016-09-22 NOTE — Assessment & Plan Note (Signed)
Well controlled, no changes to meds. Encouraged heart healthy diet such as the DASH diet and exercise as tolerated.  °

## 2016-09-22 NOTE — Assessment & Plan Note (Signed)
Tolerating statin, encouraged heart healthy diet, avoid trans fats, minimize simple carbs and saturated fats. Increase exercise as tolerated 

## 2016-09-22 NOTE — Assessment & Plan Note (Signed)
hgba1c to be checked, minimize simple carbs. Increase exercise as tolerated. Continue current meds  

## 2016-10-08 ENCOUNTER — Telehealth: Payer: Self-pay | Admitting: *Deleted

## 2016-10-08 NOTE — Telephone Encounter (Signed)
[employer] faxed over a clarification request for ziac.  It stated that patient may be allergic.  Notified patient and she stated that she has not had a problem with taking this drug.  She has been taking this per her chart since 2012.  Called [employer] and they stated they sent because of sulfa allergy.  Advised how long she has been on med and they put a note in chart and will send out to patient.

## 2016-10-10 ENCOUNTER — Other Ambulatory Visit: Payer: Self-pay | Admitting: Family Medicine

## 2016-10-10 DIAGNOSIS — IMO0002 Reserved for concepts with insufficient information to code with codable children: Secondary | ICD-10-CM

## 2016-10-10 DIAGNOSIS — E1165 Type 2 diabetes mellitus with hyperglycemia: Principal | ICD-10-CM

## 2016-10-10 DIAGNOSIS — E1151 Type 2 diabetes mellitus with diabetic peripheral angiopathy without gangrene: Secondary | ICD-10-CM

## 2016-10-14 ENCOUNTER — Other Ambulatory Visit: Payer: Medicare HMO

## 2016-10-14 ENCOUNTER — Other Ambulatory Visit (INDEPENDENT_AMBULATORY_CARE_PROVIDER_SITE_OTHER): Payer: Medicare HMO

## 2016-10-14 DIAGNOSIS — E1142 Type 2 diabetes mellitus with diabetic polyneuropathy: Secondary | ICD-10-CM | POA: Diagnosis not present

## 2016-10-14 LAB — LIPID PANEL
CHOL/HDL RATIO: 2
Cholesterol: 145 mg/dL (ref 0–200)
HDL: 71 mg/dL (ref >39.00)
LDL Cholesterol: 61 mg/dL (ref 0–99)
NONHDL: 73.93
TRIGLYCERIDES: 64 mg/dL (ref 0.0–149.0)
VLDL: 12.8 mg/dL (ref 0.0–40.0)

## 2016-10-14 LAB — COMPREHENSIVE METABOLIC PANEL
ALBUMIN: 3.9 g/dL (ref 3.5–5.2)
ALT: 12 U/L (ref 0–35)
AST: 12 U/L (ref 0–37)
Alkaline Phosphatase: 55 U/L (ref 39–117)
BILIRUBIN TOTAL: 0.5 mg/dL (ref 0.2–1.2)
BUN: 17 mg/dL (ref 6–23)
CALCIUM: 9.6 mg/dL (ref 8.4–10.5)
CO2: 33 meq/L — AB (ref 19–32)
CREATININE: 0.72 mg/dL (ref 0.40–1.20)
Chloride: 99 mEq/L (ref 96–112)
GFR: 85.47 mL/min (ref >60.00)
Glucose, Bld: 125 mg/dL — ABNORMAL HIGH (ref 70–99)
Potassium: 3.6 mEq/L (ref 3.5–5.1)
Sodium: 139 mEq/L (ref 135–145)
Total Protein: 6.9 g/dL (ref 6.0–8.3)

## 2016-10-14 LAB — HEMOGLOBIN A1C: Hgb A1c MFr Bld: 6.7 % — ABNORMAL HIGH (ref 4.6–6.5)

## 2016-10-24 ENCOUNTER — Other Ambulatory Visit: Payer: Self-pay | Admitting: Family Medicine

## 2016-10-24 DIAGNOSIS — I10 Essential (primary) hypertension: Secondary | ICD-10-CM

## 2016-11-12 DIAGNOSIS — G4700 Insomnia, unspecified: Secondary | ICD-10-CM | POA: Diagnosis not present

## 2016-11-12 DIAGNOSIS — M62831 Muscle spasm of calf: Secondary | ICD-10-CM | POA: Diagnosis not present

## 2016-11-12 DIAGNOSIS — E1142 Type 2 diabetes mellitus with diabetic polyneuropathy: Secondary | ICD-10-CM | POA: Diagnosis not present

## 2016-11-12 DIAGNOSIS — G894 Chronic pain syndrome: Secondary | ICD-10-CM | POA: Diagnosis not present

## 2016-11-12 DIAGNOSIS — Z79891 Long term (current) use of opiate analgesic: Secondary | ICD-10-CM | POA: Diagnosis not present

## 2016-11-19 ENCOUNTER — Other Ambulatory Visit: Payer: Self-pay | Admitting: Family Medicine

## 2016-11-27 DIAGNOSIS — H2513 Age-related nuclear cataract, bilateral: Secondary | ICD-10-CM | POA: Diagnosis not present

## 2016-11-27 DIAGNOSIS — E119 Type 2 diabetes mellitus without complications: Secondary | ICD-10-CM | POA: Diagnosis not present

## 2016-11-27 DIAGNOSIS — H5213 Myopia, bilateral: Secondary | ICD-10-CM | POA: Diagnosis not present

## 2016-11-27 DIAGNOSIS — H04123 Dry eye syndrome of bilateral lacrimal glands: Secondary | ICD-10-CM | POA: Diagnosis not present

## 2016-11-27 LAB — HM DIABETES EYE EXAM

## 2016-12-07 ENCOUNTER — Other Ambulatory Visit: Payer: Self-pay | Admitting: Family Medicine

## 2016-12-07 DIAGNOSIS — F411 Generalized anxiety disorder: Secondary | ICD-10-CM

## 2016-12-11 NOTE — Telephone Encounter (Signed)
Pt would like a refill on 2 medications.    1. Besylate   2. amLODipine    Pharmacy: [facility], [place]

## 2016-12-12 ENCOUNTER — Other Ambulatory Visit: Payer: Self-pay

## 2016-12-12 DIAGNOSIS — I10 Essential (primary) hypertension: Secondary | ICD-10-CM

## 2016-12-12 MED ORDER — AMLODIPINE BESYLATE 5 MG PO TABS: 5.0000 mg | ORAL_TABLET | Freq: Every day | ORAL | 5 refills | Status: DC

## 2016-12-16 ENCOUNTER — Telehealth: Payer: Self-pay | Admitting: Family Medicine

## 2016-12-16 DIAGNOSIS — I10 Essential (primary) hypertension: Secondary | ICD-10-CM

## 2016-12-16 MED ORDER — AMLODIPINE BESYLATE 5 MG PO TABS
5.0000 mg | ORAL_TABLET | Freq: Every day | ORAL | 0 refills | Status: DC
Start: 2016-12-16 — End: 2017-03-25

## 2016-12-16 MED ORDER — AMLODIPINE BESYLATE 5 MG PO TABS: 5.0000 mg | ORAL_TABLET | Freq: Every day | ORAL | 0 refills | Status: DC

## 2016-12-16 NOTE — Telephone Encounter (Signed)
Self.    Refill for amLODipine- 1 month supply to local pharmacy and then a 3 month supply to mail order ([place], [place])    Pt says that she will be out tomorrow.    Pharmacy: [facility].

## 2016-12-16 NOTE — Telephone Encounter (Signed)
Refills sent in. Spoke with pt and made her aware. She had no additional questions at this time.

## 2016-12-17 NOTE — Telephone Encounter (Signed)
Pt says pharm could not fill 30 day supply Amlodipine due to [employer] already processed and put in the mail. Pt asking if Carollee Herter can do a seven day supply to [facility] [facility]. She is out and it will take that long for the 90 day to come in the mail. Call [phone].

## 2016-12-17 NOTE — Telephone Encounter (Signed)
Called [facility] and they stated that they could fill a 7 days supply for a cost of $4. Called pt to explain this to her but unable to reach her. Left message on to call back

## 2016-12-18 MED ORDER — AMLODIPINE BESYLATE 5 MG PO TABS: 5.0000 mg | ORAL_TABLET | Freq: Every day | ORAL | 0 refills | Status: DC

## 2016-12-18 NOTE — Telephone Encounter (Signed)
Pt says that she just spoke with mail order and they said that they will not be able to ship pts medicine for a few days. Pt would like to know if provider is able to supply more until?     Pharmacy: [facility]

## 2016-12-18 NOTE — Addendum Note (Signed)
Addended by: Marin Roberts on: 12/18/2016 08:47 AM   Modules accepted: Orders

## 2016-12-18 NOTE — Telephone Encounter (Signed)
Spoke with pt and she states that she is ok with pay out of pocket. Rx for a week worth was sent to pharmacy. Pt had no additional questions at this time. Nothing further is needed

## 2016-12-19 NOTE — Telephone Encounter (Signed)
Called pt and she does not need anything further

## 2016-12-19 NOTE — Telephone Encounter (Signed)
Patient returned call and states she received mail order yesterday but has already picked up supply from retail

## 2016-12-19 NOTE — Telephone Encounter (Signed)
Called pt's mail order pharmacy and they stated pt's rx was shipped on the 10 and on their end arrived to pt's home yesterday. They asked [place] to please assure that pt received rx so that they can see how they can authorize a refill at local pharmacy.  Left message on machine to call back

## 2016-12-31 ENCOUNTER — Ambulatory Visit: Payer: Self-pay | Admitting: Podiatry

## 2017-01-03 ENCOUNTER — Telehealth: Payer: Self-pay | Admitting: Family Medicine

## 2017-01-03 NOTE — Telephone Encounter (Signed)
Pt request Brittany [name] refer pt to someone that can lance the cyst on her neck. Pt states Lowne told her to call if it became more bothersome. Pt states she is hitting it with her brush and comb which is aggravating the cyst.

## 2017-01-07 NOTE — Telephone Encounter (Signed)
Another issue: please call in to [facility] home delivery Furosemide 20 mg for 90 day supply.  Also please fu on referral for cyst to be lanced. Pt ph [phone].

## 2017-01-08 ENCOUNTER — Other Ambulatory Visit: Payer: Self-pay

## 2017-01-08 ENCOUNTER — Telehealth: Payer: Self-pay | Admitting: Family Medicine

## 2017-01-08 DIAGNOSIS — IMO0002 Reserved for concepts with insufficient information to code with codable children: Secondary | ICD-10-CM

## 2017-01-08 DIAGNOSIS — E1165 Type 2 diabetes mellitus with hyperglycemia: Principal | ICD-10-CM

## 2017-01-08 DIAGNOSIS — E1151 Type 2 diabetes mellitus with diabetic peripheral angiopathy without gangrene: Secondary | ICD-10-CM

## 2017-01-08 NOTE — Progress Notes (Signed)
Deactivated duplicate Amlodipine, Atorvastatin & Escitalopram in system/thx dmf

## 2017-01-09 ENCOUNTER — Ambulatory Visit: Payer: Self-pay | Admitting: Podiatry

## 2017-01-09 ENCOUNTER — Other Ambulatory Visit: Payer: Self-pay | Admitting: Family Medicine

## 2017-01-09 DIAGNOSIS — L0291 Cutaneous abscess, unspecified: Secondary | ICD-10-CM

## 2017-01-09 NOTE — Telephone Encounter (Signed)
Ok to call in 90 day 3 refills

## 2017-01-10 MED ORDER — FUROSEMIDE 20 MG PO TABS: 20.0000 mg | ORAL_TABLET | Freq: Every day | ORAL | 3 refills | Status: DC

## 2017-01-10 NOTE — Telephone Encounter (Signed)
rx sent to pharmacy

## 2017-01-23 ENCOUNTER — Encounter: Payer: Self-pay | Admitting: Podiatry

## 2017-01-23 ENCOUNTER — Ambulatory Visit (INDEPENDENT_AMBULATORY_CARE_PROVIDER_SITE_OTHER): Payer: Medicare HMO | Admitting: Podiatry

## 2017-01-23 DIAGNOSIS — M2041 Other hammer toe(s) (acquired), right foot: Secondary | ICD-10-CM

## 2017-01-23 DIAGNOSIS — M6700 Short Achilles tendon (acquired), unspecified ankle: Secondary | ICD-10-CM | POA: Diagnosis not present

## 2017-01-23 DIAGNOSIS — L97511 Non-pressure chronic ulcer of other part of right foot limited to breakdown of skin: Secondary | ICD-10-CM

## 2017-01-23 DIAGNOSIS — R69 Illness, unspecified: Secondary | ICD-10-CM | POA: Diagnosis not present

## 2017-01-23 DIAGNOSIS — E1142 Type 2 diabetes mellitus with diabetic polyneuropathy: Secondary | ICD-10-CM

## 2017-01-23 NOTE — Patient Instructions (Signed)
Seen for diabetic foot care. Noted of pre ulcerative lesion at distal end 2nd toe right. Deformed nails. Tight Achilles tendon on both lower limbs. May benefit from diabetic shoes. Need daily stretch exercise as seen on instruction sheet. All nails debrided. Will do diabetic shoes when returns with signed PCP certification form for diabetic shoes. Return in 3 month for routine foot care.
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
## 2017-01-30 NOTE — Telephone Encounter (Signed)
Copied from [place]. Topic: Quick Communication - See Telephone Encounter >> Jan 30, 2017  2:12 PM Eli Phillips, NT wrote: CRM for notification. See Telephone encounter for:  Medication refill  01/30/17.  Patient called and states she needs a refill of her One Touch Ultra Mini test strip and Lancets. Her pharmacy [facility] order. Please advise Thank you

## 2017-02-04 ENCOUNTER — Ambulatory Visit: Payer: Medicare HMO | Admitting: *Deleted

## 2017-02-07 ENCOUNTER — Other Ambulatory Visit: Payer: Self-pay

## 2017-02-07 DIAGNOSIS — R69 Illness, unspecified: Secondary | ICD-10-CM | POA: Diagnosis not present

## 2017-02-07 MED ORDER — GLUCOSE BLOOD VI STRP: ORAL_STRIP | 12 refills | Status: DC

## 2017-02-07 MED ORDER — ONETOUCH DELICA LANCETS 33G MISC: 12 refills | Status: DC

## 2017-02-07 NOTE — Telephone Encounter (Signed)
Lancets refilled as requested

## 2017-02-10 ENCOUNTER — Telehealth: Payer: Self-pay | Admitting: Family Medicine

## 2017-02-10 NOTE — Telephone Encounter (Signed)
Copied from [place] [id number]. Topic: Quick Communication - See Telephone Encounter >> Feb 10, 2017 11:03 AM Rosalin Hawking wrote: CRM for notification. See Telephone encounter for:  02/10/17.   Pt dropped off document to be filled out by provider. Document is Prescription for Diabetic Shoes to be filled out. Pt would like to be called at [phone] when document done. Document put at front office tray.

## 2017-02-11 NOTE — Telephone Encounter (Signed)
Received Physician Orders from [facility], completed as much as possible on Statement of Certifying Physician; forwarded to provider/SLS 11/06

## 2017-02-13 NOTE — Telephone Encounter (Signed)
Completed form faxed to [phone]

## 2017-02-26 DIAGNOSIS — Z79891 Long term (current) use of opiate analgesic: Secondary | ICD-10-CM | POA: Diagnosis not present

## 2017-02-26 DIAGNOSIS — E1142 Type 2 diabetes mellitus with diabetic polyneuropathy: Secondary | ICD-10-CM | POA: Diagnosis not present

## 2017-02-26 DIAGNOSIS — G894 Chronic pain syndrome: Secondary | ICD-10-CM | POA: Diagnosis not present

## 2017-02-26 DIAGNOSIS — G4700 Insomnia, unspecified: Secondary | ICD-10-CM | POA: Diagnosis not present

## 2017-02-28 ENCOUNTER — Other Ambulatory Visit: Payer: Self-pay | Admitting: Family Medicine

## 2017-02-28 DIAGNOSIS — Z1231 Encounter for screening mammogram for malignant neoplasm of breast: Secondary | ICD-10-CM

## 2017-03-05 ENCOUNTER — Ambulatory Visit: Payer: Medicare HMO | Admitting: Podiatry

## 2017-03-11 NOTE — Telephone Encounter (Signed)
Pt called back in, she said that her foot Dr. Is stating that they never received fax. Please refax if possible.

## 2017-03-18 ENCOUNTER — Ambulatory Visit: Payer: Medicare HMO | Admitting: Podiatry

## 2017-03-25 ENCOUNTER — Other Ambulatory Visit: Payer: Self-pay | Admitting: Family Medicine

## 2017-03-25 DIAGNOSIS — I10 Essential (primary) hypertension: Secondary | ICD-10-CM

## 2017-03-27 ENCOUNTER — Ambulatory Visit (HOSPITAL_BASED_OUTPATIENT_CLINIC_OR_DEPARTMENT_OTHER): Payer: Medicare HMO

## 2017-03-27 ENCOUNTER — Ambulatory Visit: Payer: Medicare HMO | Admitting: Family Medicine

## 2017-03-31 ENCOUNTER — Encounter (HOSPITAL_COMMUNITY): Payer: Self-pay | Admitting: Emergency Medicine

## 2017-03-31 ENCOUNTER — Emergency Department (HOSPITAL_COMMUNITY)
Admission: EM | Admit: 2017-03-31 | Discharge: 2017-03-31 | Disposition: A | Discharge status: Home / Self Care | Payer: Medicare HMO | Attending: Emergency Medicine | Admitting: Emergency Medicine

## 2017-03-31 DIAGNOSIS — Z760 Encounter for issue of repeat prescription: Secondary | ICD-10-CM | POA: Insufficient documentation

## 2017-03-31 NOTE — ED Provider Notes (Signed)
[facility] DEPT Provider Note   CSN: [id number] Arrival date & time: 03/31/17  1706     History   Chief Complaint Chief Complaint  Patient presents with  . Medication Refill    HPI LATRICE STORLIE is a 71 y.o. female with past medical history of diabetes, hypertension, neuropathy, presenting to the ED for medication refill of her morphine tablets that she takes for her neuropathy. Denies any new symptoms. Patient states she refilled her prescription recently and gets her medications delivered by mail.  She states she was notified that her the delivery will not arrive until the 27th of this month.  She states she just ran out of her medication today, and took her last dose prior to arrival.  Per prescription monitoring program, prescription for 60-day supply of morphine sulfate extended release 60 mg tablets was filled on 03/26/2017.  The history is provided by the patient.    Past Medical History:  Diagnosis Date  . Diabetes mellitus   . GERD (gastroesophageal reflux disease)   . Hypertension   . Neuropathy     Patient Active Problem List   Diagnosis Date Noted  . Hyperlipidemia LDL goal <70 09/22/2016  . Tooth pain 09/03/2012  . Edema 09/03/2012  . Supraclavicular fossa fullness 06/18/2012  . Hair loss 09/18/2010  . Fatigue 09/18/2010  . DYSPEPSIA&OTHER [facility] DISORDERS FUNCTION STOMACH 05/08/2010  . DIARRHEA 05/08/2010  . DIZZINESS 04/19/2010  . OTHER ACUTE SINUSITIS 02/28/2010  . NAUSEA 02/28/2010  . KNEE PAIN, LEFT 02/15/2010  . Type 2 diabetes mellitus with diabetic polyneuropathy, without long-term current use of insulin ([place]) 08/10/2009  . URI 07/09/2007  . GERD 02/12/2007  . LOW BACK PAIN 02/12/2007  . NEUROPATHY 09/03/2006  . Essential hypertension 09/03/2006  . HOT FLASHES 09/03/2006  . INSOMNIA 09/03/2006    Past Surgical History:  Procedure Laterality Date  . LUMBAR FUSION      OB History    No data available        Home Medications    Prior to Admission medications   Medication Sig Start Date End Date Taking? Authorizing Provider  amLODipine (NORVASC) 5 MG tablet TAKE 1 TABLET DAILY 03/25/17   Carollee Herter, Alferd Apa, DO  aspirin 81 MG tablet Take 81 mg by mouth daily.    [provider]  atorvastatin (LIPITOR) 10 MG tablet Take 1 tablet (10 mg total) by mouth daily. 09/05/16   Ann Held, DO  bisoprolol-hydrochlorothiazide [facility]) 10-6.25 MG tablet TAKE 1 TABLET DAILY 10/24/16   Carollee Herter, Alferd Apa, DO  calcium carbonate (TUMS EX) 750 MG chewable tablet Chew 1 tablet by mouth 4 (four) times daily as needed. For acid reflux    [provider]  Cholecalciferol (VITAMIN D) 1000 UNITS capsule Take 1,000 Units by mouth daily.    [provider]  docusate sodium (COLACE) 100 MG capsule Take 100 mg by mouth 2 (two) times daily.    [provider]  doxepin (SINEQUAN) 25 MG capsule Take 1 capsule by mouth as needed. 09/11/16   [provider]  escitalopram (LEXAPRO) 10 MG tablet TAKE 1 TABLET DAILY (DUE   FOR AN OFFICE VISIT NOW) 12/11/16   Carollee Herter, Alferd Apa, DO  estradiol (ESTRACE) 1 MG tablet Take 1 tablet (1 mg total) by mouth daily. 06/19/16   Ann Held, DO  folic acid (FOLVITE) 177 MCG tablet Take 400 mcg by mouth daily.    [provider]  furosemide (LASIX) 20 MG tablet Take 1 tablet (20 mg total) by mouth daily. 01/10/17   Ann Held, DO  gabapentin (NEURONTIN) 800 MG tablet Take 1 tablet (800 mg total) by mouth 4 (four) times daily. 05/04/15   Roma Schanz R, DO  glucose blood (ONE TOUCH ULTRA TEST) test strip Check Blood sugar once daily 02/07/17   Carollee Herter, Kendrick Fries R, DO  l-methylfolate-B6-B12 (METANX) 3-35-2 MG TABS Take 1 tablet by mouth daily.    [provider]  loratadine (CLARITIN) 10 MG tablet Take 10 mg by mouth daily as needed. For allergies    [provider]  metFORMIN  (GLUCOPHAGE-XR) 500 MG 24 hr tablet TAKE ONE TABLET BY MOUTH IN THE MORNING AND THEN TWO TABS IN THE EVENING 04/19/16   Carollee Herter, Yvonne R, DO  metFORMIN (GLUCOPHAGE-XR) 500 MG 24 hr tablet TAKE 1 TABLET EVERY MORNINGAND TAKE 2 TABLETS EVERY   EVENING 01/08/17   Roma Schanz R, DO  morphine (MS CONTIN) 60 MG 12 hr tablet Take 60 mg by mouth 3 (three) times daily. 04/22/11   Ann Held, DO  Multiple Vitamin (MULTIVITAMIN WITH MINERALS) TABS Take 1 tablet by mouth daily.    [provider]  omega-3 acid ethyl esters (LOVAZA) 1 G capsule Take 1 g by mouth daily.    [provider]  omeprazole (PRILOSEC) 40 MG capsule Take 1 capsule (40 mg total) by mouth daily. 09/05/16   Ann Held, DO  omeprazole (PRILOSEC) 40 MG capsule TAKE 1 CAPSULE DAILY 11/19/16   Carollee Herter, Alferd Apa, DO  [facility] DELICA LANCETS 70J MISC Check blood sugar once daily 02/07/17   Carollee Herter, Kendrick Fries R, DO  OVER THE COUNTER MEDICATION Take 1 tablet by mouth daily. Hair, skin, and nails vitamin    [provider]  tiZANidine (ZANAFLEX) 4 MG tablet Take 1 tablet by mouth 3 (three) times daily.  08/26/10   [provider]    Family History Family History  Problem Relation Age of Onset  . Cancer Mother        breast  . Depression Mother        bipolar  . Heart disease Father 67       MI  . Diabetes Brother   . Kidney disease Brother   . Hypertension Brother   . Hyperlipidemia Brother   . Stroke Brother   . Coronary artery disease Unknown   . Diabetes Unknown     Social History Social History   Tobacco Use  . Smoking status: Never Smoker  . Smokeless tobacco: Never Used  Substance Use Topics  . Alcohol use: No  . Drug use: No     Allergies   Sulfonamide derivatives   Review of Systems Review of Systems  All other systems reviewed and are negative.    Physical Exam Updated Vital Signs BP 138/60 (BP Location: Left Arm)   Pulse 66   Temp  98.3 F (36.8 C) (Oral)   Resp 16   Ht 5' 9.5" (1.765 m)   Wt 86.3 kg (190 lb 3 oz)   SpO2 100%   BMI 27.68 kg/m   Physical Exam  Constitutional: She appears well-developed and well-nourished.  HENT:  Head: Normocephalic and atraumatic.  Eyes: Conjunctivae are normal.  Cardiovascular: Normal rate.  Pulmonary/Chest: Effort normal.  Abdominal: Soft.  Neurological: She is alert.  Skin: Skin is warm.  Psychiatric: She has a normal mood and affect. Her  behavior is normal.  Nursing note and vitals reviewed.    ED Treatments / Results  Labs (all labs ordered are listed, but only abnormal results are displayed) Labs Reviewed - No data to display  EKG  EKG Interpretation None       Radiology No results found.  Procedures Procedures (including critical care time)  Medications Ordered in ED Medications - No data to display   Initial Impression / Assessment and Plan / ED Course  I have reviewed the triage vital signs and the nursing notes.  Pertinent labs & imaging results that were available during my care of the patient were reviewed by me and considered in my medical decision making (see chart for details).     Patient presenting for medication refill of 60 mg morphine tablets.  Per prescription monitoring program, a refill of her morphine prescription for 60-day supply was filled on 03/26/2017.  Patient she gets mail delivery and the package is delayed and will be delivered until the 27th of this month.  Had discussion with patient that she needs to contact her pain management clinic for medication refills and informed her that this ED would be unable to fill that medication.  Discussed alternative treatments for pain management at home, including over-the-counter medications and topical ice or heat.  Patient discussed with  Dr. Sherry Ruffing.  Discussed results, findings, treatment and follow up. Patient advised of return precautions. Patient verbalized understanding and  agreed with plan.  Final Clinical Impressions(s) / ED Diagnoses   Final diagnoses:  Encounter for medication refill    ED Discharge Orders    None       Margrete Delude, [place] N, PA-C 03/31/17 1839    Tegeler, Gwenyth Allegra, MD 04/01/17 [phone]

## 2017-03-31 NOTE — ED Triage Notes (Addendum)
Patient here from home for medication refill on Morphine 60mg . Reports that it is prescribed by pain management.

## 2017-03-31 NOTE — Discharge Instructions (Signed)
Please contact your pain management clinic for medication refills.

## 2017-04-08 ENCOUNTER — Other Ambulatory Visit: Payer: Self-pay | Admitting: Family Medicine

## 2017-04-08 DIAGNOSIS — IMO0002 Reserved for concepts with insufficient information to code with codable children: Secondary | ICD-10-CM

## 2017-04-08 DIAGNOSIS — E1165 Type 2 diabetes mellitus with hyperglycemia: Principal | ICD-10-CM

## 2017-04-08 DIAGNOSIS — E1151 Type 2 diabetes mellitus with diabetic peripheral angiopathy without gangrene: Secondary | ICD-10-CM

## 2017-04-24 ENCOUNTER — Ambulatory Visit: Payer: Medicare HMO | Admitting: Podiatry

## 2017-05-01 ENCOUNTER — Encounter: Payer: Self-pay | Admitting: Family Medicine

## 2017-05-01 ENCOUNTER — Ambulatory Visit (INDEPENDENT_AMBULATORY_CARE_PROVIDER_SITE_OTHER): Payer: Medicare HMO | Admitting: Family Medicine

## 2017-05-01 ENCOUNTER — Ambulatory Visit (HOSPITAL_BASED_OUTPATIENT_CLINIC_OR_DEPARTMENT_OTHER)
Admission: RE | Admit: 2017-05-01 | Discharge: 2017-05-01 | Disposition: A | Discharge status: Home / Self Care | Payer: Medicare HMO | Source: Ambulatory Visit | Attending: Family Medicine | Admitting: Family Medicine

## 2017-05-01 VITALS — BP 138/70 | HR 62 | Temp 98.0°F | Resp 16 | Ht 69.0 in | Wt 189.6 lb

## 2017-05-01 DIAGNOSIS — E1142 Type 2 diabetes mellitus with diabetic polyneuropathy: Secondary | ICD-10-CM

## 2017-05-01 DIAGNOSIS — I10 Essential (primary) hypertension: Secondary | ICD-10-CM | POA: Diagnosis not present

## 2017-05-01 DIAGNOSIS — IMO0002 Reserved for concepts with insufficient information to code with codable children: Secondary | ICD-10-CM

## 2017-05-01 DIAGNOSIS — Z1231 Encounter for screening mammogram for malignant neoplasm of breast: Secondary | ICD-10-CM | POA: Diagnosis not present

## 2017-05-01 DIAGNOSIS — E1151 Type 2 diabetes mellitus with diabetic peripheral angiopathy without gangrene: Secondary | ICD-10-CM

## 2017-05-01 DIAGNOSIS — E1165 Type 2 diabetes mellitus with hyperglycemia: Secondary | ICD-10-CM

## 2017-05-01 DIAGNOSIS — E785 Hyperlipidemia, unspecified: Secondary | ICD-10-CM | POA: Diagnosis not present

## 2017-05-01 MED ORDER — ONETOUCH ULTRA MINI W/DEVICE KIT: PACK | 0 refills | Status: DC

## 2017-05-01 NOTE — Patient Instructions (Signed)

## 2017-05-01 NOTE — Assessment & Plan Note (Signed)
Well controlled, no changes to meds. Encouraged heart healthy diet such as the DASH diet and exercise as tolerated.  °

## 2017-05-01 NOTE — Progress Notes (Signed)
Patient ID: Brittany [name], female    DOB: 04-23-1945  Age: 72 y.o. MRN: [id number]    Subjective:  Subjective  HPI Brittany [name] presents for f/u htn , dm and hyperlipidemia  HYPERTENSION   Blood pressure range-not checking   Chest pain- no      Dyspnea- no Lightheadedness- no   Edema- no  Other side effects - no   Medication compliance: good Low salt diet- yes    DIABETES    Blood Sugar ranges-not checking-- she lost her meter  Polyuria- no New Visual problems- no  Hypoglycemic symptoms- no  Other side effects-no Medication compliance - good Last eye exam- 11/2016 Foot exam- today   HYPERLIPIDEMIA  Medication compliance- good RUQ pain- no  Muscle aches- no Other side effects-no    Review of Systems  Constitutional: Negative for appetite change, diaphoresis, fatigue and unexpected weight change.  Eyes: Negative for pain, redness and visual disturbance.  Respiratory: Negative for cough, chest tightness, shortness of breath and wheezing.   Cardiovascular: Negative for chest pain, palpitations and leg swelling.  Endocrine: Negative for cold intolerance, heat intolerance, polydipsia, polyphagia and polyuria.  Genitourinary: Negative for difficulty urinating, dysuria and frequency.  Neurological: Negative for dizziness, light-headedness, numbness and headaches.    History Past Medical History:  Diagnosis Date  . Diabetes mellitus   . GERD (gastroesophageal reflux disease)   . Hypertension   . Neuropathy     She has a past surgical history that includes Lumbar fusion.   Her family history includes Cancer in her mother; Coronary artery disease in her unknown relative; Depression in her mother; Diabetes in her brother and unknown relative; Heart disease (age of onset: 89) in her father; Hyperlipidemia in her brother; Hypertension in her brother; Kidney disease in her brother; Stroke in her brother.She reports that  has never smoked. she has never used smokeless  tobacco. She reports that she does not drink alcohol or use drugs.  Current Outpatient Medications on File Prior to Visit  Medication Sig Dispense Refill  . amLODipine (NORVASC) 5 MG tablet TAKE 1 TABLET DAILY 90 tablet 0  . aspirin 81 MG tablet Take 81 mg by mouth daily.    [name] atorvastatin (LIPITOR) 10 MG tablet Take 1 tablet (10 mg total) by mouth daily. 90 tablet 1  . bisoprolol-hydrochlorothiazide (ZIAC) 10-6.25 MG tablet TAKE 1 TABLET DAILY 90 tablet 1  . calcium carbonate (TUMS EX) 750 MG chewable tablet Chew 1 tablet by mouth 4 (four) times daily as needed. For acid reflux    . Cholecalciferol (VITAMIN D) 1000 UNITS capsule Take 1,000 Units by mouth daily.    [name] docusate sodium (COLACE) 100 MG capsule Take 100 mg by mouth 2 (two) times daily.    [name] doxepin (SINEQUAN) 25 MG capsule Take 1 capsule by mouth as needed.    [name] escitalopram (LEXAPRO) 10 MG tablet TAKE 1 TABLET DAILY (DUE   FOR AN OFFICE VISIT NOW) 90 tablet 1  . estradiol (ESTRACE) 1 MG tablet Take 1 tablet (1 mg total) by mouth daily. 90 tablet 1  . folic acid (FOLVITE) 559 MCG tablet Take 400 mcg by mouth daily.    . furosemide (LASIX) 20 MG tablet Take 1 tablet (20 mg total) by mouth daily. 90 tablet 3  . gabapentin (NEURONTIN) 800 MG tablet Take 1 tablet (800 mg total) by mouth 4 (four) times daily.    [name] glucose blood (ONE TOUCH ULTRA TEST) test strip Check Blood sugar once daily  100 each 12  . l-methylfolate-B6-B12 (METANX) 3-35-2 MG TABS Take 1 tablet by mouth daily.    [name] loratadine (CLARITIN) 10 MG tablet Take 10 mg by mouth daily as needed. For allergies    . metFORMIN (GLUCOPHAGE-XR) 500 MG 24 hr tablet TAKE ONE TABLET BY MOUTH IN THE MORNING AND THEN TWO TABS IN THE EVENING 90 tablet 0  . metFORMIN (GLUCOPHAGE-XR) 500 MG 24 hr tablet TAKE 1 TABLET EVERY MORNINGAND TAKE 2 TABLETS EVERY   EVENING 270 tablet 0  . morphine (MS CONTIN) 60 MG 12 hr tablet Take 60 mg by mouth 3 (three) times daily.    . Multiple Vitamin  (MULTIVITAMIN WITH MINERALS) TABS Take 1 tablet by mouth daily.    [name] omega-3 acid ethyl esters (LOVAZA) 1 G capsule Take 1 g by mouth daily.    [name] omeprazole (PRILOSEC) 40 MG capsule Take 1 capsule (40 mg total) by mouth daily. 90 capsule 1  . ONETOUCH DELICA LANCETS 69C MISC Check blood sugar once daily 100 each 12  . OVER THE COUNTER MEDICATION Take 1 tablet by mouth daily. Hair, skin, and nails vitamin    . tiZANidine (ZANAFLEX) 4 MG tablet Take 1 tablet by mouth 3 (three) times daily.      No current facility-administered medications on file prior to visit.      Objective:  Objective  Physical Exam  Constitutional: She is oriented to person, place, and time. She appears well-developed and well-nourished.  HENT:  Head: Normocephalic and atraumatic.  Eyes: Conjunctivae and EOM are normal.  Neck: Normal range of motion. Neck supple. No JVD present. Carotid bruit is not present. No thyromegaly present.  Cardiovascular: Normal rate, regular rhythm and normal heart sounds.  No murmur heard. Pulmonary/Chest: Effort normal and breath sounds normal. No respiratory distress. She has no wheezes. She has no rales. She exhibits no tenderness.  Musculoskeletal: She exhibits no edema.  Neurological: She is alert and oriented to person, place, and time.  Psychiatric: She has a normal mood and affect.  Nursing note and vitals reviewed. monofilament-- pt unable to feel the monofilament    BP 138/70 (BP Location: Right Arm, Cuff Size: Normal)   Pulse 62   Temp 98 F (36.7 C) (Oral)   Resp 16   Ht _0  (1.753 m)   Wt 189 lb 9.6 oz (86 kg)   SpO2 97%   BMI 28.00 kg/m  Wt Readings from Last 3 Encounters:  05/01/17 189 lb 9.6 oz (86 kg)  03/31/17 190 lb 3 oz (86.3 kg)  09/20/16 176 lb 9.6 oz (80.1 kg)     Lab Results  Component Value Date   WBC 7.5 03/11/2016   HGB 13.2 03/11/2016   HCT 39.6 03/11/2016   PLT 221.0 03/11/2016   GLUCOSE 125 (H) 10/14/2016   CHOL 145 10/14/2016   TRIG  64.0 10/14/2016   HDL 71.00 10/14/2016   LDLCALC 61 10/14/2016   ALT 12 10/14/2016   AST 12 10/14/2016   NA 139 10/14/2016   K 3.6 10/14/2016   CL 99 10/14/2016   CREATININE 0.72 10/14/2016   BUN 17 10/14/2016   CO2 33 (H) 10/14/2016   TSH 0.46 06/18/2012   HGBA1C 6.7 (H) 10/14/2016   MICROALBUR 0.4 02/11/2014    No results found.   Assessment & Plan:  Plan  I am having Brittany Shrieves. [name] start on ONE TOUCH ULTRA MINI. I am also having her maintain her tiZANidine, docusate sodium, calcium carbonate, omega-3 acid  ethyl esters, Vitamin D, multivitamin with minerals, loratadine, OVER THE COUNTER MEDICATION, morphine, [id number], folic acid, aspirin, gabapentin, metFORMIN, estradiol, atorvastatin, omeprazole, doxepin, bisoprolol-hydrochlorothiazide, escitalopram, furosemide, ONETOUCH DELICA LANCETS 44Y, glucose blood, amLODipine, and metFORMIN.  Meds ordered this encounter  Medications  . Blood Glucose Monitoring Suppl (ONE TOUCH ULTRA MINI) w/Device KIT    Sig: Use as directed once a day.  Dx Code:  E11.42    Dispense:  1 each    Refill:  0    Problem List Items Addressed This Visit      Unprioritized   Essential hypertension    Well controlled, no changes to meds. Encouraged heart healthy diet such as the DASH diet and exercise as tolerated.       Relevant Orders   Hemoglobin A1c   Comprehensive metabolic panel   Lipid panel   Hyperlipidemia LDL goal <70    Encouraged heart healthy diet, increase exercise, avoid trans fats, consider a krill oil cap daily      Relevant Orders   Hemoglobin A1c   Comprehensive metabolic panel   Type 2 diabetes mellitus with diabetic polyneuropathy, without long-term current use of insulin ([facility])    hgba1c to be checked, minimize simple carbs. Increase exercise as tolerated. Continue current meds      Relevant Medications   Blood Glucose Monitoring Suppl (ONE TOUCH ULTRA MINI) w/Device KIT    Other Visit Diagnoses    DM  (diabetes mellitus) type II uncontrolled, periph vascular disorder ([place])    -  Primary   Relevant Medications   Blood Glucose Monitoring Suppl (ONE TOUCH ULTRA MINI) w/Device KIT   Other Relevant Orders   Hemoglobin A1c   Comprehensive metabolic panel   Lipid panel   Microalbumin / creatinine urine ratio      Follow-up: Return in about 6 months (around 10/29/2017), or if symptoms worsen or fail to improve, for annual exam, fasting.  Ann Held, DO

## 2017-05-01 NOTE — Assessment & Plan Note (Signed)
Encouraged heart healthy diet, increase exercise, avoid trans fats, consider a krill oil cap daily 

## 2017-05-01 NOTE — Assessment & Plan Note (Signed)
hgba1c to be checked, minimize simple carbs. Increase exercise as tolerated. Continue current meds  

## 2017-05-05 ENCOUNTER — Other Ambulatory Visit: Payer: Self-pay | Admitting: Family Medicine

## 2017-05-05 DIAGNOSIS — R928 Other abnormal and inconclusive findings on diagnostic imaging of breast: Secondary | ICD-10-CM

## 2017-05-08 ENCOUNTER — Telehealth: Payer: Self-pay | Admitting: *Deleted

## 2017-05-08 NOTE — Telephone Encounter (Signed)
Received Physician Orders from [place]; forwarded to provider/SLS 01/31

## 2017-05-09 ENCOUNTER — Other Ambulatory Visit: Payer: Medicare HMO

## 2017-05-13 ENCOUNTER — Ambulatory Visit
Admission: RE | Admit: 2017-05-13 | Discharge: 2017-05-13 | Disposition: A | Discharge status: Home / Self Care | Payer: Medicare HMO | Source: Ambulatory Visit | Attending: Family Medicine | Admitting: Family Medicine

## 2017-05-13 ENCOUNTER — Ambulatory Visit: Payer: Medicare HMO

## 2017-05-13 DIAGNOSIS — Z79891 Long term (current) use of opiate analgesic: Secondary | ICD-10-CM | POA: Diagnosis not present

## 2017-05-13 DIAGNOSIS — E1142 Type 2 diabetes mellitus with diabetic polyneuropathy: Secondary | ICD-10-CM | POA: Diagnosis not present

## 2017-05-13 DIAGNOSIS — G4700 Insomnia, unspecified: Secondary | ICD-10-CM | POA: Diagnosis not present

## 2017-05-13 DIAGNOSIS — R928 Other abnormal and inconclusive findings on diagnostic imaging of breast: Secondary | ICD-10-CM

## 2017-05-13 DIAGNOSIS — R922 Inconclusive mammogram: Secondary | ICD-10-CM | POA: Diagnosis not present

## 2017-05-13 DIAGNOSIS — G894 Chronic pain syndrome: Secondary | ICD-10-CM | POA: Diagnosis not present

## 2017-05-14 ENCOUNTER — Ambulatory Visit: Payer: Self-pay | Admitting: *Deleted

## 2017-05-14 DIAGNOSIS — R69 Illness, unspecified: Secondary | ICD-10-CM | POA: Diagnosis not present

## 2017-05-14 NOTE — Telephone Encounter (Signed)
Brittany [name] phoned to report both of her legs have swelling between the knees and calves. She reports the swelling looks equal on both sides and denies any pain, pitting or redness on either. She takes her medication as prescribed.  Advice given per protocol and appointment made for tomorrow with PCP.   Reason for Disposition . [1] MODERATE leg swelling (e.g., swelling extends up to knees) AND [2] new onset or worsening  Answer Assessment - Initial Assessment Questions 1. ONSET: "When did the swelling start?" (e.g., minutes, hours, days)     Just noticed the swelling this morning 2. LOCATION: "What part of the leg is swollen?"  "Are both legs swollen or just one leg?"     Both legs with swelling between knees and past calves. 3. SEVERITY: "How bad is the swelling?" (e.g., localized; mild, moderate, severe)  - Localized - small area of swelling localized to one leg  - MILD pedal edema - swelling limited to foot and ankle, pitting edema < 1/4 inch (6 mm) deep, rest and elevation eliminate most or all swelling  - MODERATE edema - swelling of lower leg to knee, pitting edema > 1/4 inch (6 mm) deep, rest and elevation only partially reduce swelling  - SEVERE edema - swelling extends above knee, facial or hand swelling present      Mild-She is able to walk without difficultly. 4. REDNESS: "Does the swelling look red or infected?"    no 5. PAIN: "Is the swelling painful to touch?" If so, ask: "How painful is it?"   (Scale 1-10; mild, moderate or severe)    no 6. FEVER: "Do you have a fever?" If so, ask: "What is it, how was it measured, and when did it start?"    No fever 7. CAUSE: "What do you think is causing the leg swelling?"    no 8. MEDICAL HISTORY: "Do you have a history of heart failure, kidney disease, liver failure, or cancer?"   no 9. RECURRENT SYMPTOM: "Have you had leg swelling before?" If so, ask: "When was the last time?" "What happened that time?"     No never before 10. OTHER  SYMPTOMS: "Do you have any other symptoms?" (e.g., chest pain, difficulty breathing)      no 11. PREGNANCY: "Is there any chance you are pregnant?" "When was your last menstrual period?"       no  Protocols used: LEG SWELLING AND EDEMA-A-AH

## 2017-05-15 ENCOUNTER — Ambulatory Visit (HOSPITAL_BASED_OUTPATIENT_CLINIC_OR_DEPARTMENT_OTHER)
Admission: RE | Admit: 2017-05-15 | Discharge: 2017-05-15 | Disposition: A | Discharge status: Home / Self Care | Payer: Medicare HMO | Source: Ambulatory Visit | Attending: Family Medicine | Admitting: Family Medicine

## 2017-05-15 ENCOUNTER — Other Ambulatory Visit (INDEPENDENT_AMBULATORY_CARE_PROVIDER_SITE_OTHER): Payer: Medicare HMO

## 2017-05-15 ENCOUNTER — Ambulatory Visit (INDEPENDENT_AMBULATORY_CARE_PROVIDER_SITE_OTHER): Payer: Medicare HMO | Admitting: Family Medicine

## 2017-05-15 ENCOUNTER — Encounter: Payer: Self-pay | Admitting: Family Medicine

## 2017-05-15 VITALS — BP 132/66 | HR 67 | Temp 98.4°F | Resp 16 | Ht 69.0 in | Wt 187.8 lb

## 2017-05-15 DIAGNOSIS — E1165 Type 2 diabetes mellitus with hyperglycemia: Secondary | ICD-10-CM | POA: Diagnosis not present

## 2017-05-15 DIAGNOSIS — R600 Localized edema: Secondary | ICD-10-CM

## 2017-05-15 DIAGNOSIS — I10 Essential (primary) hypertension: Secondary | ICD-10-CM

## 2017-05-15 DIAGNOSIS — E1151 Type 2 diabetes mellitus with diabetic peripheral angiopathy without gangrene: Secondary | ICD-10-CM

## 2017-05-15 DIAGNOSIS — E785 Hyperlipidemia, unspecified: Secondary | ICD-10-CM | POA: Diagnosis not present

## 2017-05-15 DIAGNOSIS — IMO0002 Reserved for concepts with insufficient information to code with codable children: Secondary | ICD-10-CM

## 2017-05-15 LAB — LIPID PANEL
CHOLESTEROL: 139 mg/dL (ref 0–200)
HDL: 71.5 mg/dL (ref >39.00)
LDL Cholesterol: 55 mg/dL (ref 0–99)
NonHDL: 67.83
TRIGLYCERIDES: 64 mg/dL (ref 0.0–149.0)
Total CHOL/HDL Ratio: 2
VLDL: 12.8 mg/dL (ref 0.0–40.0)

## 2017-05-15 LAB — MICROALBUMIN / CREATININE URINE RATIO
Creatinine,U: 151.5 mg/dL
MICROALB/CREAT RATIO: 0.8 mg/g (ref 0.0–30.0)
Microalb, Ur: 1.2 mg/dL (ref 0.0–1.9)

## 2017-05-15 LAB — COMPREHENSIVE METABOLIC PANEL
ALK PHOS: 50 U/L (ref 39–117)
ALT: 12 U/L (ref 0–35)
AST: 14 U/L (ref 0–37)
Albumin: 3.7 g/dL (ref 3.5–5.2)
BILIRUBIN TOTAL: 0.3 mg/dL (ref 0.2–1.2)
BUN: 16 mg/dL (ref 6–23)
CO2: 34 mEq/L — ABNORMAL HIGH (ref 19–32)
Calcium: 9 mg/dL (ref 8.4–10.5)
Chloride: 99 mEq/L (ref 96–112)
Creatinine, Ser: 0.7 mg/dL (ref 0.40–1.20)
GFR: 88.15 mL/min (ref >60.00)
Glucose, Bld: 124 mg/dL — ABNORMAL HIGH (ref 70–99)
POTASSIUM: 3.7 meq/L (ref 3.5–5.1)
Sodium: 140 mEq/L (ref 135–145)
TOTAL PROTEIN: 6.8 g/dL (ref 6.0–8.3)

## 2017-05-15 LAB — HEMOGLOBIN A1C: Hgb A1c MFr Bld: 7 % — ABNORMAL HIGH (ref 4.6–6.5)

## 2017-05-15 MED ORDER — FUROSEMIDE 40 MG PO TABS: 40.0000 mg | ORAL_TABLET | Freq: Every day | ORAL | 3 refills | Status: DC

## 2017-05-15 NOTE — Patient Instructions (Addendum)
Edema Edema is an abnormal buildup of fluids in your bodytissues. Edema is somewhatdependent on gravity to pull the fluid to the lowest place in your body. That makes the condition more common in the legs and thighs (lower extremities). Painless swelling of the feet and ankles is common and becomes more likely as you get older. It is also common in looser tissues, like around your eyes. When the affected area is squeezed, the fluid may move out of that spot and leave a dent for a few moments. This dent is called pitting. What are the causes? There are many possible causes of edema. Eating too much salt and being on your feet or sitting for a long time can cause edema in your legs and ankles. Hot weather may make edema worse. Common medical causes of edema include:  Heart failure.  Liver disease.  Kidney disease.  Weak blood vessels in your legs.  Cancer.  An injury.  Pregnancy.  Some medications.  Obesity.  What are the signs or symptoms? Edema is usually painless.Your skin may look swollen or shiny. How is this diagnosed? Your health care provider may be able to diagnose edema by asking about your medical history and doing a physical exam. You may need to have tests such as X-rays, an electrocardiogram, or blood tests to check for medical conditions that may cause edema. How is this treated? Edema treatment depends on the cause. If you have heart, liver, or kidney disease, you need the treatment appropriate for these conditions. General treatment may include:  Elevation of the affected body part above the level of your heart.  Compression of the affected body part. Pressure from elastic bandages or support stockings squeezes the tissues and forces fluid back into the blood vessels. This keeps fluid from entering the tissues.  Restriction of fluid and salt intake.  Use of a water pill (diuretic). These medications are appropriate only for some types of edema. They pull fluid  out of your body and make you urinate more often. This gets rid of fluid and reduces swelling, but diuretics can have side effects. Only use diuretics as directed by your health care provider.  Follow these instructions at home:  Keep the affected body part above the level of your heart when you are lying down.  Do not sit still or stand for prolonged periods.  Do not put anything directly under your knees when lying down.  Do not wear constricting clothing or garters on your upper legs.  Exercise your legs to work the fluid back into your blood vessels. This may help the swelling go down.  Wear elastic bandages or support stockings to reduce ankle swelling as directed by your health care provider.  Eat a low-salt diet to reduce fluid if your health care provider recommends it.  Only take medicines as directed by your health care provider. Contact a health care provider if:  Your edema is not responding to treatment.  You have heart, liver, or kidney disease and notice symptoms of edema.  You have edema in your legs that does not improve after elevating them.  You have sudden and unexplained weight gain. Get help right away if:  You develop shortness of breath or chest pain.  You cannot breathe when you lie down.  You develop pain, redness, or warmth in the swollen areas.  You have heart, liver, or kidney disease and suddenly get edema.  You have a fever and your symptoms suddenly get worse. This information is   not intended to replace advice given to you by your health care provider. Make sure you discuss any questions you have with your health care provider. Document Released: 03/25/2005 Document Revised: 08/31/2015 Document Reviewed: 01/15/2013 Elsevier Interactive Patient Education  2017 [employer].   Abdominal Bloating When you have abdominal bloating, your abdomen may feel full, tight, or painful. It may also look bigger than normal or swollen (distended). Common  causes of abdominal bloating include:  Swallowing air.  Constipation.  Problems digesting food.  Eating too much.  Irritable bowel syndrome. This is a condition that affects the large intestine.  Lactose intolerance. This is an inability to digest lactose, a natural sugar in dairy products.  Celiac disease. This is a condition that affects the ability to digest gluten, a protein found in some grains.  Gastroparesis. This is a condition that slows down the movement of food in the stomach and small intestine. It is more common in people with diabetes mellitus.  Gastroesophageal reflux disease (GERD). This is a digestive condition that makes stomach acid flow back into the esophagus.  Urinary retention. This means that the body is holding onto urine, and the bladder cannot be emptied all the way.  Follow these instructions at home: Eating and drinking  Avoid eating too much.  Try not to swallow air while talking or eating.  Avoid eating while lying down.  Avoid these foods and drinks: ? Foods that cause gas, such as broccoli, cabbage, cauliflower, and baked beans. ? Carbonated drinks. ? Hard candy. ? Chewing gum. Medicines  Take over-the-counter and prescription medicines only as told by your health care provider.  Take probiotic medicines. These medicines contain live bacteria or yeasts that can help digestion.  Take coated peppermint oil capsules. Activity  Try to exercise regularly. Exercise may help to relieve bloating that is caused by gas and relieve constipation. General instructions  Keep all follow-up visits as told by your health care provider. This is important. Contact a health care provider if:  You have nausea and vomiting.  You have diarrhea.  You have abdominal pain.  You have unusual weight loss or weight gain.  You have severe pain, and medicines do not help. Get help right away if:  You have severe chest pain.  You have trouble  breathing.  You have shortness of breath.  You have trouble urinating.  You have darker urine than normal.  You have blood in your stools or have dark, tarry stools. Summary  Abdominal bloating means that the abdomen is swollen.  Common causes of abdominal bloating are swallowing air, constipation, and problems digesting food.  Avoid eating too much and avoid swallowing air.  Avoid foods that cause gas, carbonated drinks, hard candy, and chewing gum. This information is not intended to replace advice given to you by your health care provider. Make sure you discuss any questions you have with your health care provider. Document Released: 04/26/2016 Document Revised: 04/26/2016 Document Reviewed: 04/26/2016 Elsevier Interactive Patient Education  [employer].

## 2017-05-15 NOTE — Progress Notes (Signed)
Subjective:  I acted as a Education administrator for [employer], RMA   Patient ID: Brittany [name], female    DOB: 1945-05-02, 72 y.o.   MRN: [id number]  Chief Complaint  Patient presents with  . Leg Swelling    both legs started 2 days ago    HPI  Patient is in today for concerns about both legs swelling.  + pain in calf and feet with walking only.  No sob, no cp.    Patient Care Team: Carollee Herter, Alferd Apa, DO as PCP - General Nicholaus Bloom, MD as Consulting Physician (Neurology) Milus Banister, MD as Attending Physician (Gastroenterology)   Past Medical History:  Diagnosis Date  . Diabetes mellitus   . GERD (gastroesophageal reflux disease)   . Hypertension   . Neuropathy     Past Surgical History:  Procedure Laterality Date  . LUMBAR FUSION      Family History  Problem Relation Age of Onset  . Cancer Mother        breast  . Depression Mother        bipolar  . Breast cancer Mother 78  . Heart disease Father 32       MI  . Diabetes Brother   . Kidney disease Brother   . Hypertension Brother   . Hyperlipidemia Brother   . Stroke Brother   . Coronary artery disease Unknown   . Diabetes Unknown     Social History   Socioeconomic History  . Marital status: Widowed    Spouse name: Not on file  . Number of children: Not on file  . Years of education: Not on file  . Highest education level: Not on file  Social Needs  . Financial resource strain: Not on file  . Food insecurity - worry: Not on file  . Food insecurity - inability: Not on file  . Transportation needs - medical: Not on file  . Transportation needs - non-medical: Not on file  Occupational History  . Not on file  Tobacco Use  . Smoking status: Never Smoker  . Smokeless tobacco: Never Used  Substance and Sexual Activity  . Alcohol use: No  . Drug use: No  . Sexual activity: Not Currently    Partners: Male  Other Topics Concern  . Not on file  Social History Narrative  . Not on file     Outpatient Medications Prior to Visit  Medication Sig Dispense Refill  . amLODipine (NORVASC) 5 MG tablet TAKE 1 TABLET DAILY 90 tablet 0  . aspirin 81 MG tablet Take 81 mg by mouth daily.    [name] atorvastatin (LIPITOR) 10 MG tablet Take 1 tablet (10 mg total) by mouth daily. 90 tablet 1  . bisoprolol-hydrochlorothiazide (ZIAC) 10-6.25 MG tablet TAKE 1 TABLET DAILY 90 tablet 1  . Blood Glucose Monitoring Suppl (ONE TOUCH ULTRA MINI) w/Device KIT Use as directed once a day.  Dx Code:  E11.42 1 each 0  . calcium carbonate (TUMS EX) 750 MG chewable tablet Chew 1 tablet by mouth 4 (four) times daily as needed. For acid reflux    . Cholecalciferol (VITAMIN D) 1000 UNITS capsule Take 1,000 Units by mouth daily.    [name] docusate sodium (COLACE) 100 MG capsule Take 100 mg by mouth 2 (two) times daily.    [name] doxepin (SINEQUAN) 25 MG capsule Take 1 capsule by mouth as needed.    [name] escitalopram (LEXAPRO) 10 MG tablet TAKE 1 TABLET DAILY (DUE   FOR  AN OFFICE VISIT NOW) 90 tablet 1  . estradiol (ESTRACE) 1 MG tablet Take 1 tablet (1 mg total) by mouth daily. 90 tablet 1  . folic acid (FOLVITE) 527 MCG tablet Take 400 mcg by mouth daily.    [name] gabapentin (NEURONTIN) 800 MG tablet Take 1 tablet (800 mg total) by mouth 4 (four) times daily.    [name] glucose blood (ONE TOUCH ULTRA TEST) test strip Check Blood sugar once daily 100 each 12  . l-methylfolate-B6-B12 (METANX) 3-35-2 MG TABS Take 1 tablet by mouth daily.    [name] loratadine (CLARITIN) 10 MG tablet Take 10 mg by mouth daily as needed. For allergies    . metFORMIN (GLUCOPHAGE-XR) 500 MG 24 hr tablet TAKE ONE TABLET BY MOUTH IN THE MORNING AND THEN TWO TABS IN THE EVENING 90 tablet 0  . metFORMIN (GLUCOPHAGE-XR) 500 MG 24 hr tablet TAKE 1 TABLET EVERY MORNINGAND TAKE 2 TABLETS EVERY   EVENING 270 tablet 0  . morphine (MS CONTIN) 60 MG 12 hr tablet Take 60 mg by mouth 3 (three) times daily.    . Multiple Vitamin (MULTIVITAMIN WITH MINERALS) TABS Take 1 tablet by  mouth daily.    [name] omega-3 acid ethyl esters (LOVAZA) 1 G capsule Take 1 g by mouth daily.    [name] omeprazole (PRILOSEC) 40 MG capsule Take 1 capsule (40 mg total) by mouth daily. 90 capsule 1  . ONETOUCH DELICA LANCETS 78E MISC Check blood sugar once daily 100 each 12  . OVER THE COUNTER MEDICATION Take 1 tablet by mouth daily. Hair, skin, and nails vitamin    . tiZANidine (ZANAFLEX) 4 MG tablet Take 1 tablet by mouth 3 (three) times daily.     . furosemide (LASIX) 20 MG tablet Take 1 tablet (20 mg total) by mouth daily. 90 tablet 3   No facility-administered medications prior to visit.     Allergies  Allergen Reactions  . Sulfonamide Derivatives     REACTION: HIVES    Review of Systems  Constitutional: Negative for chills, fever and malaise/fatigue.  HENT: Negative for congestion and hearing loss.   Eyes: Negative for discharge.  Respiratory: Negative for cough, sputum production and shortness of breath.   Cardiovascular: Negative for chest pain, palpitations and leg swelling.  Gastrointestinal: Negative for abdominal pain, blood in stool, constipation, diarrhea, heartburn, nausea and vomiting.  Genitourinary: Negative for dysuria, frequency, hematuria and urgency.  Musculoskeletal: Negative for back pain, falls and myalgias.  Skin: Negative for rash.  Neurological: Negative for dizziness, sensory change, loss of consciousness, weakness and headaches.  Endo/Heme/Allergies: Negative for environmental allergies. Does not bruise/bleed easily.  Psychiatric/Behavioral: Negative for depression and suicidal ideas. The patient is not nervous/anxious and does not have insomnia.        Objective:    Physical Exam  Constitutional: She is oriented to person, place, and time. She appears well-developed and well-nourished.  HENT:  Head: Normocephalic and atraumatic.  Eyes: Conjunctivae and EOM are normal.  Neck: Normal range of motion. Neck supple. No JVD present. Carotid bruit is not  present. No thyromegaly present.  Cardiovascular: Normal rate, regular rhythm and normal heart sounds.  No murmur heard. Pulmonary/Chest: Effort normal and breath sounds normal. No respiratory distress. She has no wheezes. She has no rales. She exhibits no tenderness.  Musculoskeletal: She exhibits edema.       Right ankle: She exhibits swelling.       Left ankle: She exhibits swelling.       Feet:  Neurological: She is alert and oriented to person, place, and time.  Psychiatric: She has a normal mood and affect. Her behavior is normal. Judgment and thought content normal.  Nursing note and vitals reviewed.   BP 132/66 (BP Location: Left Arm, Patient Position: Sitting, Cuff Size: Normal)   Pulse 67   Temp 98.4 F (36.9 C) (Oral)   Resp 16   Ht _0  (1.753 m)   Wt 187 lb 12.8 oz (85.2 kg)   SpO2 97%   BMI 27.73 kg/m  Wt Readings from Last 3 Encounters:  05/15/17 187 lb 12.8 oz (85.2 kg)  05/01/17 189 lb 9.6 oz (86 kg)  03/31/17 190 lb 3 oz (86.3 kg)   BP Readings from Last 3 Encounters:  05/15/17 132/66  05/01/17 138/70  03/31/17 138/60     Immunization History  Administered Date(s) Administered  . DTP 07/14/2002  . Influenza Whole 02/12/2007  . Influenza, High Dose Seasonal PF 02/11/2014, 02/10/2015, 02/10/2015  . Influenza,inj,Quad PF,6+ Mos 05/14/2013, 02/19/2016  . Pneumococcal Conjugate-13 05/04/2015  . Pneumococcal Polysaccharide-23 05/14/2013  . Zoster 01/02/2010    Health Maintenance  Topic Date Due  . Samul Dada  03/25/1967  . URINE MICROALBUMIN  02/12/2015  . INFLUENZA VACCINE  11/06/2016  . HEMOGLOBIN A1C  04/16/2017  . FOOT EXAM  09/20/2017  . OPHTHALMOLOGY EXAM  11/27/2017  . MAMMOGRAM  05/01/2018  . COLONOSCOPY  06/11/2020  . DEXA SCAN  Completed  . Hepatitis C Screening  Completed  . PNA vac Low Risk Adult  Completed    Lab Results  Component Value Date   WBC 7.5 03/11/2016   HGB 13.2 03/11/2016   HCT 39.6 03/11/2016   PLT 221.0  03/11/2016   GLUCOSE 124 (H) 05/15/2017   CHOL 139 05/15/2017   TRIG 64.0 05/15/2017   HDL 71.50 05/15/2017   LDLCALC 55 05/15/2017   ALT 12 05/15/2017   AST 14 05/15/2017   NA 140 05/15/2017   K 3.7 05/15/2017   CL 99 05/15/2017   CREATININE 0.70 05/15/2017   BUN 16 05/15/2017   CO2 34 (H) 05/15/2017   TSH 0.46 06/18/2012   HGBA1C 7.0 (H) 05/15/2017   MICROALBUR 1.2 05/15/2017    Lab Results  Component Value Date   TSH 0.46 06/18/2012   Lab Results  Component Value Date   WBC 7.5 03/11/2016   HGB 13.2 03/11/2016   HCT 39.6 03/11/2016   MCV 84.6 03/11/2016   PLT 221.0 03/11/2016   Lab Results  Component Value Date   NA 140 05/15/2017   K 3.7 05/15/2017   CO2 34 (H) 05/15/2017   GLUCOSE 124 (H) 05/15/2017   BUN 16 05/15/2017   CREATININE 0.70 05/15/2017   BILITOT 0.3 05/15/2017   ALKPHOS 50 05/15/2017   AST 14 05/15/2017   ALT 12 05/15/2017   PROT 6.8 05/15/2017   ALBUMIN 3.7 05/15/2017   CALCIUM 9.0 05/15/2017   GFR 88.15 05/15/2017   Lab Results  Component Value Date   CHOL 139 05/15/2017   Lab Results  Component Value Date   HDL 71.50 05/15/2017   Lab Results  Component Value Date   LDLCALC 55 05/15/2017   Lab Results  Component Value Date   TRIG 64.0 05/15/2017   Lab Results  Component Value Date   CHOLHDL 2 05/15/2017   Lab Results  Component Value Date   HGBA1C 7.0 (H) 05/15/2017         Assessment & Plan:   Problem List Items Addressed  This Visit    None    Visit Diagnoses    Lower extremity edema    -  Primary   Relevant Medications   furosemide (LASIX) 40 MG tablet   Other Relevant Orders   Comprehensive metabolic panel   TSH   EKG 12-Lead (Completed)   DG Chest 2 View      I have discontinued Colin Mulders. Eschmann's furosemide. I am also having her start on furosemide. Additionally, I am having her maintain her tiZANidine, docusate sodium, calcium carbonate, omega-3 acid ethyl esters, Vitamin D, multivitamin with  minerals, loratadine, OVER THE COUNTER MEDICATION, morphine, [id number], folic acid, aspirin, gabapentin, metFORMIN, estradiol, atorvastatin, omeprazole, doxepin, bisoprolol-hydrochlorothiazide, escitalopram, ONETOUCH DELICA LANCETS 83T, glucose blood, amLODipine, metFORMIN, and ONE TOUCH ULTRA MINI.  Meds ordered this encounter  Medications  . furosemide (LASIX) 40 MG tablet    Sig: Take 1 tablet (40 mg total) by mouth daily.    Dispense:  30 tablet    Refill:  3    CMA served as scribe during this visit. History, Physical and Plan performed by medical provider. Documentation and orders reviewed and attested to.  Ann Held, DO

## 2017-05-16 ENCOUNTER — Telehealth: Payer: Self-pay

## 2017-05-16 DIAGNOSIS — E1151 Type 2 diabetes mellitus with diabetic peripheral angiopathy without gangrene: Secondary | ICD-10-CM

## 2017-05-16 LAB — COMPREHENSIVE METABOLIC PANEL
ALBUMIN: 3.8 g/dL (ref 3.5–5.2)
ALK PHOS: 48 U/L (ref 39–117)
ALT: 14 U/L (ref 0–35)
AST: 15 U/L (ref 0–37)
BUN: 16 mg/dL (ref 6–23)
CHLORIDE: 100 meq/L (ref 96–112)
CO2: 33 mEq/L — ABNORMAL HIGH (ref 19–32)
Calcium: 9.2 mg/dL (ref 8.4–10.5)
Creatinine, Ser: 0.73 mg/dL (ref 0.40–1.20)
GFR: 83.98 mL/min (ref >60.00)
Glucose, Bld: 149 mg/dL — ABNORMAL HIGH (ref 70–99)
POTASSIUM: 3.4 meq/L — AB (ref 3.5–5.1)
SODIUM: 140 meq/L (ref 135–145)
Total Bilirubin: 0.3 mg/dL (ref 0.2–1.2)
Total Protein: 6.7 g/dL (ref 6.0–8.3)

## 2017-05-16 LAB — TSH: TSH: 1.39 u[IU]/mL (ref 0.35–4.50)

## 2017-05-16 MED ORDER — SITAGLIPTIN PHOS-METFORMIN HCL 50-1000 MG PO TABS: ORAL_TABLET | ORAL | 3 refills | Status: DC

## 2017-05-16 NOTE — Addendum Note (Signed)
Addended by: Magdalene Molly A on: 05/16/2017 03:16 PM   Modules accepted: Orders

## 2017-05-16 NOTE — Telephone Encounter (Signed)
[name] stating that labs came back and sugars are not controlled and she needs to call the office because Brittany [name] wants to make a change to her current medications/she wants to Brittany [name]/Brittany [name] and Start Janumet XR 50/1000 2qhs and recheck Lipid; CMP; A1C in 3 months/future order created/thx dmf

## 2017-05-16 NOTE — Telephone Encounter (Signed)
-----   Message from Ann Held, DO sent at 05/15/2017  5:22 PM EST ----- Dm not controlled---  Demontae Antunes/c metformin  Start janumet xr 50/ 1000   2 po qhs #90  Recheck labs 3 months Lipid, cmp , [id number]

## 2017-05-20 ENCOUNTER — Other Ambulatory Visit: Payer: Self-pay

## 2017-05-20 DIAGNOSIS — E876 Hypokalemia: Secondary | ICD-10-CM

## 2017-05-20 MED ORDER — POTASSIUM CHLORIDE CRYS ER 20 MEQ PO TBCR
20.0000 meq | EXTENDED_RELEASE_TABLET | Freq: Every day | ORAL | 2 refills | Status: DC
Start: 2017-05-20 — End: 2017-07-09

## 2017-06-05 ENCOUNTER — Other Ambulatory Visit: Payer: Self-pay | Admitting: Family Medicine

## 2017-06-05 DIAGNOSIS — F411 Generalized anxiety disorder: Secondary | ICD-10-CM

## 2017-06-12 ENCOUNTER — Other Ambulatory Visit: Payer: Self-pay | Admitting: Family Medicine

## 2017-06-12 DIAGNOSIS — F411 Generalized anxiety disorder: Secondary | ICD-10-CM

## 2017-06-12 DIAGNOSIS — Z780 Asymptomatic menopausal state: Secondary | ICD-10-CM

## 2017-06-13 ENCOUNTER — Encounter: Payer: Self-pay | Admitting: Family Medicine

## 2017-06-13 ENCOUNTER — Ambulatory Visit (INDEPENDENT_AMBULATORY_CARE_PROVIDER_SITE_OTHER): Payer: Medicare HMO | Admitting: Family Medicine

## 2017-06-13 VITALS — BP 120/68 | HR 60 | Temp 98.9°F | Resp 16 | Ht 68.9 in | Wt 185.4 lb

## 2017-06-13 DIAGNOSIS — R609 Edema, unspecified: Secondary | ICD-10-CM

## 2017-06-13 DIAGNOSIS — R600 Localized edema: Secondary | ICD-10-CM

## 2017-06-13 DIAGNOSIS — E1142 Type 2 diabetes mellitus with diabetic polyneuropathy: Secondary | ICD-10-CM

## 2017-06-13 LAB — BASIC METABOLIC PANEL
BUN: 17 mg/dL (ref 6–23)
CO2: 34 mEq/L — ABNORMAL HIGH (ref 19–32)
Calcium: 9.4 mg/dL (ref 8.4–10.5)
Chloride: 100 mEq/L (ref 96–112)
Creatinine, Ser: 0.77 mg/dL (ref 0.40–1.20)
GFR: 78.95 mL/min (ref >60.00)
Glucose, Bld: 102 mg/dL — ABNORMAL HIGH (ref 70–99)
POTASSIUM: 3.6 meq/L (ref 3.5–5.1)
SODIUM: 140 meq/L (ref 135–145)

## 2017-06-13 NOTE — Patient Instructions (Signed)

## 2017-06-13 NOTE — Progress Notes (Signed)
Subjective:  I acted as a Education administrator for [employer]. Brittany [name], Brittany [name]   Patient ID: Brittany [name], female    DOB: 1946/01/23, 72 y.o.   MRN: [id number]  Chief Complaint  Patient presents with  . Follow-up    HPI  Patient is in today for follow up on potassium and DM.  She is doing well with the janumet and swelling has improved.   No leg cramps.     Patient Care Team: Carollee Herter, Alferd Apa, DO as PCP - General Nicholaus Bloom, MD as Consulting Physician (Neurology) Milus Banister, MD as Attending Physician (Gastroenterology)   Past Medical History:  Diagnosis Date  . Diabetes mellitus   . GERD (gastroesophageal reflux disease)   . Hypertension   . Neuropathy     Past Surgical History:  Procedure Laterality Date  . LUMBAR FUSION      Family History  Problem Relation Age of Onset  . Cancer Mother        breast  . Depression Mother        bipolar  . Breast cancer Mother 82  . Heart disease Father 28       MI  . Diabetes Brother   . Kidney disease Brother   . Hypertension Brother   . Hyperlipidemia Brother   . Stroke Brother   . Coronary artery disease Unknown   . Diabetes Unknown     Social History   Socioeconomic History  . Marital status: Widowed    Spouse name: Not on file  . Number of children: Not on file  . Years of education: Not on file  . Highest education level: Not on file  Social Needs  . Financial resource strain: Not on file  . Food insecurity - worry: Not on file  . Food insecurity - inability: Not on file  . Transportation needs - medical: Not on file  . Transportation needs - non-medical: Not on file  Occupational History  . Not on file  Tobacco Use  . Smoking status: Never Smoker  . Smokeless tobacco: Never Used  Substance and Sexual Activity  . Alcohol use: No  . Drug use: No  . Sexual activity: Not Currently    Partners: Male  Other Topics Concern  . Not on file  Social History Narrative  . Not on file    Outpatient  Medications Prior to Visit  Medication Sig Dispense Refill  . amLODipine (NORVASC) 5 MG tablet TAKE 1 TABLET DAILY 90 tablet 0  . aspirin 81 MG tablet Take 81 mg by mouth daily.    [name] atorvastatin (LIPITOR) 10 MG tablet Take 1 tablet (10 mg total) by mouth daily. 90 tablet 1  . bisoprolol-hydrochlorothiazide (ZIAC) 10-6.25 MG tablet TAKE 1 TABLET DAILY 90 tablet 1  . Blood Glucose Monitoring Suppl (ONE TOUCH ULTRA MINI) w/Device KIT Use as directed once a day.  Dx Code:  E11.42 1 each 0  . calcium carbonate (TUMS EX) 750 MG chewable tablet Chew 1 tablet by mouth 4 (four) times daily as needed. For acid reflux    . Cholecalciferol (VITAMIN D) 1000 UNITS capsule Take 1,000 Units by mouth daily.    [name] docusate sodium (COLACE) 100 MG capsule Take 100 mg by mouth 2 (two) times daily.    [name] doxepin (SINEQUAN) 25 MG capsule Take 1 capsule by mouth as needed.    [name] escitalopram (LEXAPRO) 10 MG tablet TAKE 1 TABLET DAILY (DUE   FOR AN OFFICE VISIT NOW) 90 tablet  0  . estradiol (ESTRACE) 1 MG tablet Take 1 tablet (1 mg total) by mouth daily. 90 tablet 1  . folic acid (FOLVITE) 546 MCG tablet Take 400 mcg by mouth daily.    . furosemide (LASIX) 40 MG tablet Take 1 tablet (40 mg total) by mouth daily. 30 tablet 3  . gabapentin (NEURONTIN) 800 MG tablet Take 1 tablet (800 mg total) by mouth 4 (four) times daily.    [name] glucose blood (ONE TOUCH ULTRA TEST) test strip Check Blood sugar once daily 100 each 12  . l-methylfolate-B6-B12 (METANX) 3-35-2 MG TABS Take 1 tablet by mouth daily.    [name] loratadine (CLARITIN) 10 MG tablet Take 10 mg by mouth daily as needed. For allergies    . morphine (MS CONTIN) 60 MG 12 hr tablet Take 60 mg by mouth 3 (three) times daily.    . Multiple Vitamin (MULTIVITAMIN WITH MINERALS) TABS Take 1 tablet by mouth daily.    [name] omega-3 acid ethyl esters (LOVAZA) 1 G capsule Take 1 g by mouth daily.    [name] omeprazole (PRILOSEC) 40 MG capsule Take 1 capsule (40 mg total) by mouth daily. 90  capsule 1  . ONETOUCH DELICA LANCETS 27O MISC Check blood sugar once daily 100 each 12  . OVER THE COUNTER MEDICATION Take 1 tablet by mouth daily. Hair, skin, and nails vitamin    . potassium chloride SA (K-DUR,KLOR-CON) 20 MEQ tablet Take 1 tablet (20 mEq total) by mouth daily. 30 tablet 2  . sitaGLIPtin-metformin (JANUMET) 50-1000 MG tablet Take 2 tablets po at bedtime daily 60 tablet 3  . tiZANidine (ZANAFLEX) 4 MG tablet Take 1 tablet by mouth 3 (three) times daily.      No facility-administered medications prior to visit.     Allergies  Allergen Reactions  . Sulfonamide Derivatives     REACTION: HIVES    Review of Systems  Constitutional: Negative for chills, fever and malaise/fatigue.  HENT: Negative for congestion and hearing loss.   Eyes: Negative for discharge.  Respiratory: Negative for cough, sputum production and shortness of breath.   Cardiovascular: Negative for chest pain, palpitations and leg swelling.  Gastrointestinal: Negative for abdominal pain, blood in stool, constipation, diarrhea, heartburn, nausea and vomiting.  Genitourinary: Negative for dysuria, frequency, hematuria and urgency.  Musculoskeletal: Negative for back pain, falls and myalgias.  Skin: Negative for rash.  Neurological: Negative for dizziness, sensory change, loss of consciousness, weakness and headaches.  Endo/Heme/Allergies: Negative for environmental allergies. Does not bruise/bleed easily.  Psychiatric/Behavioral: Negative for depression and suicidal ideas. The patient is not nervous/anxious and does not have insomnia.        Objective:    Physical Exam  Constitutional: She is oriented to person, place, and time. She appears well-developed and well-nourished.  HENT:  Head: Normocephalic and atraumatic.  Eyes: Conjunctivae and EOM are normal.  Neck: Normal range of motion. Neck supple. No JVD present. Carotid bruit is not present. No thyromegaly present.  Cardiovascular: Normal rate,  regular rhythm and normal heart sounds.  No murmur heard. Pulmonary/Chest: Effort normal and breath sounds normal. No respiratory distress. She has no wheezes. She has no rales. She exhibits no tenderness.  Musculoskeletal: She exhibits edema.       Right ankle: She exhibits swelling.       Left ankle: She exhibits swelling.  Tr pitting edema B/L ankles  Neurological: She is alert and oriented to person, place, and time.  Psychiatric: She has a normal mood and  affect. Her behavior is normal. Judgment and thought content normal.  Nursing note and vitals reviewed.   BP 120/68 (BP Location: Left Arm, Patient Position: Sitting, Cuff Size: Normal)   Pulse 60   Temp 98.9 F (37.2 C) (Oral)   Resp 16   Ht 5' 8.9" (1.75 m)   Wt 185 lb 6.4 oz (84.1 kg)   SpO2 97%   BMI 27.46 kg/m  Wt Readings from Last 3 Encounters:  06/13/17 185 lb 6.4 oz (84.1 kg)  05/15/17 187 lb 12.8 oz (85.2 kg)  05/01/17 189 lb 9.6 oz (86 kg)   BP Readings from Last 3 Encounters:  06/13/17 120/68  05/15/17 132/66  05/01/17 138/70     Immunization History  Administered Date(s) Administered  . DTP 07/14/2002  . Influenza Whole 02/12/2007  . Influenza, High Dose Seasonal PF 02/11/2014, 02/10/2015, 02/10/2015  . Influenza,inj,Quad PF,6+ Mos 05/14/2013, 02/19/2016  . Pneumococcal Conjugate-13 05/04/2015  . Pneumococcal Polysaccharide-23 05/14/2013  . Zoster 01/02/2010    Health Maintenance  Topic Date Due  . TETANUS/TDAP  06/14/2023 (Originally 03/25/1967)  . FOOT EXAM  09/20/2017  . HEMOGLOBIN A1C  11/12/2017  . OPHTHALMOLOGY EXAM  11/27/2017  . MAMMOGRAM  05/01/2018  . URINE MICROALBUMIN  05/15/2018  . COLONOSCOPY  06/11/2020  . INFLUENZA VACCINE  Completed  . DEXA SCAN  Completed  . Hepatitis C Screening  Completed  . PNA vac Low Risk Adult  Completed    Lab Results  Component Value Date   WBC 7.5 03/11/2016   HGB 13.2 03/11/2016   HCT 39.6 03/11/2016   PLT 221.0 03/11/2016   GLUCOSE 149 (H)  05/15/2017   CHOL 139 05/15/2017   TRIG 64.0 05/15/2017   HDL 71.50 05/15/2017   LDLCALC 55 05/15/2017   ALT 14 05/15/2017   AST 15 05/15/2017   NA 140 05/15/2017   K 3.4 (L) 05/15/2017   CL 100 05/15/2017   CREATININE 0.73 05/15/2017   BUN 16 05/15/2017   CO2 33 (H) 05/15/2017   TSH 1.39 05/15/2017   HGBA1C 7.0 (H) 05/15/2017   MICROALBUR 1.2 05/15/2017    Lab Results  Component Value Date   TSH 1.39 05/15/2017   Lab Results  Component Value Date   WBC 7.5 03/11/2016   HGB 13.2 03/11/2016   HCT 39.6 03/11/2016   MCV 84.6 03/11/2016   PLT 221.0 03/11/2016   Lab Results  Component Value Date   NA 140 05/15/2017   K 3.4 (L) 05/15/2017   CO2 33 (H) 05/15/2017   GLUCOSE 149 (H) 05/15/2017   BUN 16 05/15/2017   CREATININE 0.73 05/15/2017   BILITOT 0.3 05/15/2017   ALKPHOS 48 05/15/2017   AST 15 05/15/2017   ALT 14 05/15/2017   PROT 6.7 05/15/2017   ALBUMIN 3.8 05/15/2017   CALCIUM 9.2 05/15/2017   GFR 83.98 05/15/2017   Lab Results  Component Value Date   CHOL 139 05/15/2017   Lab Results  Component Value Date   HDL 71.50 05/15/2017   Lab Results  Component Value Date   LDLCALC 55 05/15/2017   Lab Results  Component Value Date   TRIG 64.0 05/15/2017   Lab Results  Component Value Date   CHOLHDL 2 05/15/2017   Lab Results  Component Value Date   HGBA1C 7.0 (H) 05/15/2017         Assessment & Plan:   Problem List Items Addressed This Visit      Unprioritized   Edema    Elevate  legs con't diuretic K rich foods daily-- pt given list Check bmp Compression socks       Type 2 diabetes mellitus with diabetic polyneuropathy, without long-term current use of insulin ([place])    .hgba1c 7.0, minimize simple carbs. Increase exercise as tolerated. Continue current meds Pt doing well with new meds and sugars are coming down  Recheck labs        Other Visit Diagnoses    Lower extremity edema    -  Primary   Relevant Orders   Basic metabolic  panel      I am having Colin Mulders. Kushnir maintain her tiZANidine, docusate sodium, calcium carbonate, omega-3 acid ethyl esters, Vitamin D, multivitamin with minerals, loratadine, OVER THE COUNTER MEDICATION, morphine, [id number], folic acid, aspirin, gabapentin, estradiol, atorvastatin, omeprazole, doxepin, bisoprolol-hydrochlorothiazide, ONETOUCH DELICA LANCETS 62B, glucose blood, amLODipine, ONE TOUCH ULTRA MINI, furosemide, sitaGLIPtin-metformin, potassium chloride SA, and escitalopram.  No orders of the defined types were placed in this encounter.   CMA served as Education administrator during this visit. History, Physical and Plan performed by medical provider. Documentation and orders reviewed and attested to.  Ann Held, DO

## 2017-06-13 NOTE — Assessment & Plan Note (Signed)
Elevate legs con't diuretic K rich foods daily-- pt given list Check bmp Compression socks

## 2017-06-13 NOTE — Assessment & Plan Note (Signed)
.  hgba1c 7.0, minimize simple carbs. Increase exercise as tolerated. Continue current meds Pt doing well with new meds and sugars are coming down  Recheck labs

## 2017-06-16 ENCOUNTER — Encounter: Payer: Self-pay | Admitting: *Deleted

## 2017-06-18 ENCOUNTER — Other Ambulatory Visit: Payer: Self-pay | Admitting: *Deleted

## 2017-06-18 ENCOUNTER — Telehealth: Payer: Self-pay | Admitting: Family Medicine

## 2017-06-18 MED ORDER — ATORVASTATIN CALCIUM 10 MG PO TABS: 10.0000 mg | ORAL_TABLET | Freq: Every day | ORAL | 0 refills | Status: DC

## 2017-06-18 NOTE — Telephone Encounter (Signed)
Copied from [place] [id number]. Topic: Quick Communication - Rx Refill/Question >> Jun 18, 2017 12:36 PM Margot Ables wrote: Medication: atorvastatin - has 2 weeks left - mail order pharmacy advised her to contact provider as they have faxed request and no response Has the patient contacted their pharmacy? Yes.   Preferred Pharmacy (with phone number or street name): [facility], [place] [phone] (Phone) [phone] (Fax)

## 2017-06-18 NOTE — Telephone Encounter (Signed)
Rx refilled per protocol- patient due follow up 5/19. Spoke to patient and encourage her to make that appointment.

## 2017-06-21 ENCOUNTER — Other Ambulatory Visit: Payer: Self-pay | Admitting: Family Medicine

## 2017-06-21 DIAGNOSIS — I10 Essential (primary) hypertension: Secondary | ICD-10-CM

## 2017-07-07 ENCOUNTER — Other Ambulatory Visit: Payer: Self-pay | Admitting: Family Medicine

## 2017-07-07 DIAGNOSIS — IMO0002 Reserved for concepts with insufficient information to code with codable children: Secondary | ICD-10-CM

## 2017-07-07 DIAGNOSIS — E1151 Type 2 diabetes mellitus with diabetic peripheral angiopathy without gangrene: Secondary | ICD-10-CM

## 2017-07-07 DIAGNOSIS — E1165 Type 2 diabetes mellitus with hyperglycemia: Principal | ICD-10-CM

## 2017-07-09 ENCOUNTER — Telehealth: Payer: Self-pay | Admitting: Family Medicine

## 2017-07-09 DIAGNOSIS — R600 Localized edema: Secondary | ICD-10-CM

## 2017-07-09 DIAGNOSIS — E876 Hypokalemia: Secondary | ICD-10-CM

## 2017-07-09 MED ORDER — POTASSIUM CHLORIDE CRYS ER 20 MEQ PO TBCR: 20.0000 meq | EXTENDED_RELEASE_TABLET | Freq: Every day | ORAL | 2 refills | Status: DC

## 2017-07-09 MED ORDER — SITAGLIPTIN PHOS-METFORMIN HCL 50-1000 MG PO TABS: ORAL_TABLET | ORAL | 3 refills | Status: DC

## 2017-07-09 MED ORDER — FUROSEMIDE 40 MG PO TABS: 40.0000 mg | ORAL_TABLET | Freq: Every day | ORAL | 3 refills | Status: DC

## 2017-07-09 NOTE — Telephone Encounter (Signed)
Copied from [place] [phone]. Topic: Quick Communication - Rx Refill/Question >> Jul 09, 2017 10:17 AM Ahmed Prima L wrote: Medication: sitaGLIPtin-metformin (JANUMET) 50-1000 MG tablet, furosemide (LASIX) 40 MG tablet, potassium chloride SA (K-DUR,KLOR-CON) 20 MEQ tablet Has the patient contacted their pharmacy? Yes they told her to call her PCP (Agent: If no, request that the patient contact the pharmacy for the refill.) Preferred Pharmacy (with phone number or street name): [facility], [place]: Please be advised that RX refills may take up to 3 business days. We ask that you follow-up with your pharmacy.

## 2017-07-14 ENCOUNTER — Telehealth: Payer: Self-pay | Admitting: *Deleted

## 2017-07-14 ENCOUNTER — Other Ambulatory Visit: Payer: Self-pay | Admitting: *Deleted

## 2017-07-14 NOTE — Telephone Encounter (Signed)
error 

## 2017-07-15 ENCOUNTER — Telehealth: Payer: Self-pay | Admitting: Family Medicine

## 2017-07-15 NOTE — Telephone Encounter (Signed)
Copied from [place] [phone]. Topic: Quick Communication - See Telephone Encounter >> Jul 15, 2017 10:56 AM Arletha Grippe wrote: CRM for notification. See Telephone encounter for: 07/15/17. Pt was recently shipped metformin-glucophage tablet Pharm wants to know if sitaGLIPtin-metformin (JANUMET) 50-1000 MG tablet is rplacing the metformin-glucophage tablet?  Cb is [phone] Reference - [id number]

## 2017-07-15 NOTE — Telephone Encounter (Signed)
[facility] called and spoke to [place], [facility] about the clarification of the prescriptions received for Metformin HCL 500 mg received on 07/07/17 that was shipped to the patient and the prescription for Sitagliptin-metformin HCL 50-1000 mg, which is on hold at this time at the pharmacy. They would like clarification on which medication the patient is supposed to be prescribed. I was unable to give clarification, so I advised this would be sent to the provider and someone will call back with clarification.  Phone: [phone] Reference: [id number]

## 2017-07-15 NOTE — Telephone Encounter (Signed)
Patient calling back to check on status of Janumet that is being held until Dr. Etter Sjogren confirms with [facility] that it is okay to fill it and cancel the old metformin HCL. She will be taking her last pill today and wants to know if Dr. Etter Sjogren would be inclined to send a temp supply to her local pharmacy?

## 2017-07-16 NOTE — Telephone Encounter (Signed)
Left detailed message on phone that we called mail order and stated that she is only on Janumet

## 2017-07-17 ENCOUNTER — Other Ambulatory Visit: Payer: Self-pay | Admitting: *Deleted

## 2017-07-17 MED ORDER — SITAGLIPTIN PHOS-METFORMIN HCL 50-1000 MG PO TABS: ORAL_TABLET | ORAL | 0 refills | Status: DC

## 2017-07-17 NOTE — Telephone Encounter (Signed)
°  Pt. Is out of medicine and needs some to hold her over until gets mail order.  Please call into   [facility], [address]  [place] [address]  Phone: [phone] Fax: [phone]

## 2017-07-17 NOTE — Telephone Encounter (Signed)
2 week supply sent to local pharmacy.

## 2017-08-06 ENCOUNTER — Emergency Department (HOSPITAL_BASED_OUTPATIENT_CLINIC_OR_DEPARTMENT_OTHER): Payer: Medicare HMO

## 2017-08-06 ENCOUNTER — Encounter (HOSPITAL_BASED_OUTPATIENT_CLINIC_OR_DEPARTMENT_OTHER): Payer: Self-pay | Admitting: *Deleted

## 2017-08-06 ENCOUNTER — Other Ambulatory Visit: Payer: Self-pay

## 2017-08-06 ENCOUNTER — Ambulatory Visit: Payer: Medicare HMO | Admitting: Podiatry

## 2017-08-06 ENCOUNTER — Emergency Department (HOSPITAL_BASED_OUTPATIENT_CLINIC_OR_DEPARTMENT_OTHER)
Admission: EM | Admit: 2017-08-06 | Discharge: 2017-08-06 | Disposition: A | Discharge status: Home / Self Care | Payer: Medicare HMO | Source: Home / Self Care | Attending: Emergency Medicine | Admitting: Emergency Medicine

## 2017-08-06 VITALS — BP 112/87 | HR 82 | Temp 98.9°F

## 2017-08-06 DIAGNOSIS — I10 Essential (primary) hypertension: Secondary | ICD-10-CM | POA: Insufficient documentation

## 2017-08-06 DIAGNOSIS — M79661 Pain in right lower leg: Secondary | ICD-10-CM | POA: Diagnosis not present

## 2017-08-06 DIAGNOSIS — L03115 Cellulitis of right lower limb: Secondary | ICD-10-CM | POA: Insufficient documentation

## 2017-08-06 DIAGNOSIS — E1142 Type 2 diabetes mellitus with diabetic polyneuropathy: Secondary | ICD-10-CM | POA: Diagnosis not present

## 2017-08-06 DIAGNOSIS — Z79899 Other long term (current) drug therapy: Secondary | ICD-10-CM | POA: Insufficient documentation

## 2017-08-06 DIAGNOSIS — S90821A Blister (nonthermal), right foot, initial encounter: Secondary | ICD-10-CM

## 2017-08-06 DIAGNOSIS — E119 Type 2 diabetes mellitus without complications: Secondary | ICD-10-CM

## 2017-08-06 DIAGNOSIS — L089 Local infection of the skin and subcutaneous tissue, unspecified: Secondary | ICD-10-CM | POA: Diagnosis not present

## 2017-08-06 DIAGNOSIS — L02611 Cutaneous abscess of right foot: Secondary | ICD-10-CM | POA: Diagnosis not present

## 2017-08-06 DIAGNOSIS — M7989 Other specified soft tissue disorders: Secondary | ICD-10-CM | POA: Diagnosis not present

## 2017-08-06 DIAGNOSIS — E11621 Type 2 diabetes mellitus with foot ulcer: Secondary | ICD-10-CM | POA: Diagnosis not present

## 2017-08-06 DIAGNOSIS — Z7982 Long term (current) use of aspirin: Secondary | ICD-10-CM

## 2017-08-06 DIAGNOSIS — Z7984 Long term (current) use of oral hypoglycemic drugs: Secondary | ICD-10-CM | POA: Insufficient documentation

## 2017-08-06 LAB — CBC WITH DIFFERENTIAL/PLATELET
Basophils Absolute: 0 10*3/uL (ref 0.0–0.1)
Basophils Relative: 0 %
Eosinophils Absolute: 0.1 10*3/uL (ref 0.0–0.7)
Eosinophils Relative: 1 %
HEMATOCRIT: 34.3 % — AB (ref 36.0–46.0)
HEMOGLOBIN: 11.3 g/dL — AB (ref 12.0–15.0)
LYMPHS ABS: 1.3 10*3/uL (ref 0.7–4.0)
LYMPHS PCT: 13 %
MCH: 28.9 pg (ref 26.0–34.0)
MCHC: 32.9 g/dL (ref 30.0–36.0)
MCV: 87.7 fL (ref 78.0–100.0)
MONOS PCT: 11 %
Monocytes Absolute: 1.1 10*3/uL — ABNORMAL HIGH (ref 0.1–1.0)
NEUTROS PCT: 75 %
Neutro Abs: 7.4 10*3/uL (ref 1.7–7.7)
Platelets: 231 10*3/uL (ref 150–400)
RBC: 3.91 MIL/uL (ref 3.87–5.11)
RDW: 12.8 % (ref 11.5–15.5)
WBC: 9.9 10*3/uL (ref 4.0–10.5)

## 2017-08-06 LAB — COMPREHENSIVE METABOLIC PANEL
ALT: 11 U/L — AB (ref 14–54)
ANION GAP: 10 (ref 5–15)
AST: 18 U/L (ref 15–41)
Albumin: 3.4 g/dL — ABNORMAL LOW (ref 3.5–5.0)
Alkaline Phosphatase: 47 U/L (ref 38–126)
BUN: 15 mg/dL (ref 6–20)
CHLORIDE: 98 mmol/L — AB (ref 101–111)
CO2: 27 mmol/L (ref 22–32)
Calcium: 8.7 mg/dL — ABNORMAL LOW (ref 8.9–10.3)
Creatinine, Ser: 0.59 mg/dL (ref 0.44–1.00)
GFR calc non Af Amer: >60 mL/min (ref >60)
Glucose, Bld: 200 mg/dL — ABNORMAL HIGH (ref 65–99)
Potassium: 3.3 mmol/L — ABNORMAL LOW (ref 3.5–5.1)
SODIUM: 135 mmol/L (ref 135–145)
Total Bilirubin: 0.5 mg/dL (ref 0.3–1.2)
Total Protein: 7.1 g/dL (ref 6.5–8.1)

## 2017-08-06 LAB — I-STAT CG4 LACTIC ACID, ED: LACTIC ACID, VENOUS: 1.78 mmol/L (ref 0.5–1.9)

## 2017-08-06 MED ORDER — POTASSIUM CHLORIDE CRYS ER 20 MEQ PO TBCR
40.0000 meq | EXTENDED_RELEASE_TABLET | Freq: Once | ORAL | Status: AC
  Administered 2017-08-06: 40 meq via ORAL
  Filled 2017-08-06: qty 2

## 2017-08-06 MED ORDER — DOXYCYCLINE HYCLATE 100 MG PO CAPS: 100.0000 mg | ORAL_CAPSULE | Freq: Two times a day (BID) | ORAL | 0 refills | Status: DC

## 2017-08-06 MED ORDER — MORPHINE SULFATE (PF) 4 MG/ML IV SOLN
4.0000 mg | Freq: Once | INTRAVENOUS | Status: AC
  Administered 2017-08-06: 4 mg via INTRAVENOUS
  Filled 2017-08-06: qty 1

## 2017-08-06 MED ORDER — PIPERACILLIN-TAZOBACTAM 3.375 G IVPB 30 MIN
3.3750 g | Freq: Once | INTRAVENOUS | Status: AC
  Administered 2017-08-06: 3.375 g via INTRAVENOUS
  Filled 2017-08-06 (×2): qty 50

## 2017-08-06 MED ORDER — SODIUM CHLORIDE 0.9 % IV BOLUS
1000.0000 mL | Freq: Once | INTRAVENOUS | Status: AC
  Administered 2017-08-06: 1000 mL via INTRAVENOUS

## 2017-08-06 NOTE — Discharge Instructions (Addendum)
Return to the ER if your redness, pain worsens or if you develop fevers or other new/concerning symptoms.  Otherwise follow-up within the next 2 days with your doctor for a wound care check.  Take the antibiotics as instructed until completion even if you are feeling better.

## 2017-08-06 NOTE — ED Notes (Signed)
ED Provider at bedside. 

## 2017-08-06 NOTE — ED Provider Notes (Signed)
[facility] EMERGENCY DEPARTMENT Provider Note   CSN: [id number] Arrival date & time: 08/06/17  1447     History   Chief Complaint Chief Complaint  Patient presents with  . Leg Pain    HPI Brittany [name] is a 72 y.o. female.  HPI  72 year old female with a history of diabetes and diabetic neuropathy in both legs presents with right lower extremity swelling and pain.  She states she noticed pain in her foot and leg yesterday and then around 2 AM this morning noticed redness in her leg.  She went to her podiatrist who sent her to the ER.  She has not noticed any fevers.  However she has noticed clear and foul-smelling discharge.  The pain is currently severe in her leg.  She rates it as a 9 out of 10. No known injuries.  She has a chronic callus to the plantar aspect of her right foot she thinks this is where the drainage is coming from.  Past Medical History:  Diagnosis Date  . Diabetes mellitus   . GERD (gastroesophageal reflux disease)   . Hypertension   . Neuropathy     Patient Active Problem List   Diagnosis Date Noted  . Hyperlipidemia LDL goal <70 09/22/2016  . Tooth pain 09/03/2012  . Edema 09/03/2012  . Supraclavicular fossa fullness 06/18/2012  . Hair loss 09/18/2010  . Fatigue 09/18/2010  . DYSPEPSIA&OTHER [facility] DISORDERS FUNCTION STOMACH 05/08/2010  . DIARRHEA 05/08/2010  . DIZZINESS 04/19/2010  . OTHER ACUTE SINUSITIS 02/28/2010  . NAUSEA 02/28/2010  . KNEE PAIN, LEFT 02/15/2010  . Type 2 diabetes mellitus with diabetic polyneuropathy, without long-term current use of insulin ([place]) 08/10/2009  . URI 07/09/2007  . GERD 02/12/2007  . LOW BACK PAIN 02/12/2007  . NEUROPATHY 09/03/2006  . Essential hypertension 09/03/2006  . HOT FLASHES 09/03/2006  . INSOMNIA 09/03/2006    Past Surgical History:  Procedure Laterality Date  . LUMBAR FUSION       OB History   None      Home Medications    Prior to Admission medications   Medication Sig  Start Date End Date Taking? Authorizing Provider  amLODipine (NORVASC) 5 MG tablet TAKE 1 TABLET DAILY 06/24/17  Yes Roma Schanz R, DO  aspirin 81 MG tablet Take 81 mg by mouth daily.   Yes [provider]  atorvastatin (LIPITOR) 10 MG tablet Take 1 tablet (10 mg total) by mouth daily. 06/18/17  Yes Ann Held, DO  bisoprolol-hydrochlorothiazide [facility]) 10-6.25 MG tablet TAKE 1 TABLET DAILY 10/24/16  Yes Roma Schanz R, DO  calcium carbonate (TUMS EX) 750 MG chewable tablet Chew 1 tablet by mouth 4 (four) times daily as needed. For acid reflux   Yes [provider]  Cholecalciferol (VITAMIN D) 1000 UNITS capsule Take 1,000 Units by mouth daily.   Yes [provider]  docusate sodium (COLACE) 100 MG capsule Take 100 mg by mouth 2 (two) times daily.   Yes [provider]  escitalopram (LEXAPRO) 10 MG tablet Take 1 tablet (10 mg total) by mouth daily. 06/13/17  Yes Roma Schanz R, DO  estradiol (ESTRACE) 1 MG tablet TAKE 1 TABLET DAILY 06/13/17  Yes Ann Held, DO  folic acid (FOLVITE) 101 MCG tablet Take 400 mcg by mouth daily.   Yes [provider]  furosemide (LASIX) 40 MG tablet Take 1 tablet (40 mg total) by mouth daily. 07/09/17  Yes Roma Schanz  R, DO  gabapentin (NEURONTIN) 800 MG tablet Take 1 tablet (800 mg total) by mouth 4 (four) times daily. 05/04/15  Yes Roma Schanz R, DO  l-methylfolate-B6-B12 (METANX) 3-35-2 MG TABS Take 1 tablet by mouth daily.   Yes [provider]  morphine (MS CONTIN) 60 MG 12 hr tablet Take 60 mg by mouth 3 (three) times daily. 04/22/11  Yes Ann Held, DO  Multiple Vitamin (MULTIVITAMIN WITH MINERALS) TABS Take 1 tablet by mouth daily.   Yes [provider]  omega-3 acid ethyl esters (LOVAZA) 1 G capsule Take 1 g by mouth daily.   Yes [provider]  omeprazole (PRILOSEC) 40 MG capsule TAKE 1 CAPSULE DAILY 06/13/17  Yes Roma Schanz R, DO  potassium chloride SA (K-DUR,KLOR-CON) 20 MEQ tablet Take 1 tablet (20 mEq total) by mouth daily. 07/09/17  Yes Ann Held, DO  sitaGLIPtin-metformin (JANUMET) 50-1000 MG tablet Take 2 tablets po at bedtime daily 07/09/17  Yes Roma Schanz R, DO  tiZANidine (ZANAFLEX) 4 MG tablet Take 1 tablet by mouth 3 (three) times daily.  08/26/10  Yes [provider]  Blood Glucose Monitoring Suppl (ONE TOUCH ULTRA MINI) w/Device KIT Use as directed once a day.  Dx Code:  E11.42 05/01/17   Carollee Herter, Alferd Apa, DO  doxepin (SINEQUAN) 25 MG capsule Take 1 capsule by mouth as needed. 09/11/16   [provider]  doxycycline (VIBRAMYCIN) 100 MG capsule Take 1 capsule (100 mg total) by mouth 2 (two) times daily. One po bid x 7 days 08/06/17   Sherwood Gambler, MD  glucose blood (ONE TOUCH ULTRA TEST) test strip Check Blood sugar once daily 02/07/17   Carollee Herter, Alferd Apa, DO  loratadine (CLARITIN) 10 MG tablet Take 10 mg by mouth daily as needed. For allergies    [provider]  [facility] DELICA LANCETS 70Y MISC Check blood sugar once daily 02/07/17   Carollee Herter, Kendrick Fries R, DO  OVER THE COUNTER MEDICATION Take 1 tablet by mouth daily. Hair, skin, and nails vitamin    [provider]  sitaGLIPtin-metformin (JANUMET) 50-1000 MG tablet Take 2 tablets po at bedtime daily 07/17/17   Ann Held, DO    Family History Family History  Problem Relation Age of Onset  . Cancer Mother        breast  . Depression Mother        bipolar  . Breast cancer Mother 76  . Heart disease Father 71       MI  . Diabetes Brother   . Kidney disease Brother   . Hypertension Brother   . Hyperlipidemia Brother   . Stroke Brother   . Coronary artery disease Unknown   . Diabetes Unknown     Social History Social History   Tobacco Use  . Smoking status: Never Smoker  . Smokeless tobacco: Never Used  Substance Use Topics  . Alcohol use: No  . Drug use: No      Allergies   Sulfonamide derivatives   Review of Systems Review of Systems  Constitutional: Negative for fever.  Cardiovascular: Positive for leg swelling.  Skin: Positive for color change and wound.  All other systems reviewed and are negative.    Physical Exam Updated Vital Signs BP (!) 124/51   Pulse 67   Temp 98.4 F (36.9 C) (Oral)   Resp 18   Ht '5\' 9"'$  (1.753 m)   Wt 78 kg (172  lb)   SpO2 97%   BMI 25.40 kg/m   Physical Exam  Constitutional: She is oriented to person, place, and time. She appears well-developed and well-nourished. No distress.  HENT:  Head: Normocephalic and atraumatic.  Right Ear: External ear normal.  Left Ear: External ear normal.  Nose: Nose normal.  Eyes: Right eye exhibits no discharge. Left eye exhibits no discharge.  Cardiovascular: Normal rate and regular rhythm.  Pulses:      Dorsalis pedis pulses are 2+ on the right side.  Pulmonary/Chest: Effort normal.  Abdominal: She exhibits no distension.  Musculoskeletal: She exhibits edema.  See picture. Diffuse swelling and redness to RLE with minimal drainage to right plantar foot. Diffusely tender. Erythema and warmth extend from foot to just above right knee.  Normal ROM of ankle and knee.  Neurological: She is alert and oriented to person, place, and time.  Skin: Skin is warm and dry. She is not diaphoretic. There is erythema.  Nursing note and vitals reviewed.        ED Treatments / Results  Labs (all labs ordered are listed, but only abnormal results are displayed) Labs Reviewed  COMPREHENSIVE METABOLIC PANEL - Abnormal; Notable for the following components:      Result Value   Potassium 3.3 (*)    Chloride 98 (*)    Glucose, Bld 200 (*)    Calcium 8.7 (*)    Albumin 3.4 (*)    ALT 11 (*)    All other components within normal limits  CBC WITH DIFFERENTIAL/PLATELET - Abnormal; Notable for the following components:   Hemoglobin 11.3 (*)    HCT 34.3 (*)    Monocytes  Absolute 1.1 (*)    All other components within normal limits  CULTURE, BLOOD (ROUTINE X 2)  CULTURE, BLOOD (ROUTINE X 2)  I-STAT CG4 LACTIC ACID, ED  I-STAT CG4 LACTIC ACID, ED    EKG None  Radiology US Venous Img Lower Unilateral Right  Result Date: 08/06/2017 CLINICAL DATA:  Right lower extremity swelling, erythema, and pain for 2 days. EXAM: RIGHT LOWER EXTREMITY VENOUS DOPPLER ULTRASOUND TECHNIQUE: Gray-scale sonography with graded compression, as well as color Doppler and duplex ultrasound were performed to evaluate the lower extremity deep venous systems from the level of the common femoral vein and including the common femoral, femoral, profunda femoral, popliteal and calf veins including the posterior tibial, peroneal and gastrocnemius veins when visible. The superficial great saphenous vein was also interrogated. Spectral Doppler was utilized to evaluate flow at rest and with distal augmentation maneuvers in the common femoral, femoral and popliteal veins. COMPARISON:  None. FINDINGS: Contralateral Common Femoral Vein: Respiratory phasicity is normal and symmetric with the symptomatic side. No evidence of thrombus. Normal compressibility. Common Femoral Vein: No evidence of thrombus. Normal compressibility, respiratory phasicity and response to augmentation. Saphenofemoral Junction: No evidence of thrombus. Normal compressibility and flow on color Doppler imaging. Profunda Femoral Vein: No evidence of thrombus. Normal compressibility and flow on color Doppler imaging. Femoral Vein: No evidence of thrombus. Normal compressibility, respiratory phasicity and response to augmentation. Popliteal Vein: No evidence of thrombus. Normal compressibility, respiratory phasicity and response to augmentation. Calf Veins: No evidence of thrombus. Normal compressibility and flow on color Doppler imaging. Superficial Great Saphenous Vein: No evidence of thrombus. Normal compressibility. Venous Reflux:  None.  Other Findings:  None. IMPRESSION: No evidence of deep venous thrombosis. Electronically Signed   By: Earle Gell M.D.   On: 08/06/2017 18:42   Dg Foot Complete  Right  Result Date: 08/06/2017 CLINICAL DATA:  72 y/o F; diabetic wound on the bottom of the right foot on plantar surface around first and second metatarsals. EXAM: RIGHT FOOT COMPLETE - 3+ VIEW COMPARISON:  None. FINDINGS: There is no evidence of fracture or dislocation. Chronic postsurgical changes of the distal first metatarsal. There is no evidence of arthropathy or other focal bone abnormality. IMPRESSION: No findings of osteomyelitis. Electronically Signed   By: Kristine Garbe M.D.   On: 08/06/2017 17:13    Procedures Procedures (including critical care time)  Medications Ordered in ED Medications  piperacillin-tazobactam (ZOSYN) IVPB 3.375 g (0 g Intravenous Stopped 08/06/17 1706)  sodium chloride 0.9 % bolus 1,000 mL (0 mLs Intravenous Stopped 08/06/17 1706)  morphine 4 MG/ML injection 4 mg (4 mg Intravenous Given 08/06/17 1632)  potassium chloride SA (K-DUR,KLOR-CON) CR tablet 40 mEq (40 mEq Oral Given 08/06/17 1849)     Initial Impression / Assessment and Plan / ED Course  I have reviewed the triage vital signs and the nursing notes.  Pertinent labs & imaging results that were available during my care of the patient were reviewed by me and considered in my medical decision making (see chart for details).     DVT ultrasound is negative.  X-ray was obtained but at this point given normal white blood cell count, no fever, I have low suspicion of osteomyelitis.  Her other lab work besides mild hypokalemia and mild hyperglycemia is benign.  While her exam does look impressive, her pain is well controlled at this point and her labs are reassuring.  I discussed options with patient and she prefers to go home.  She was given a dose of IV Zosyn, has no known MRSA in the past.  She will be treated with doxycycline.  We discussed  strict return precautions as well as need for close outpatient follow-up with PCP.  Final Clinical Impressions(s) / ED Diagnoses   Final diagnoses:  Cellulitis of right lower extremity    ED Discharge Orders        Ordered    doxycycline (VIBRAMYCIN) 100 MG capsule  2 times daily     08/06/17 1917       Sherwood Gambler, MD 08/06/17 1936

## 2017-08-06 NOTE — ED Notes (Signed)
Right lower leg (calf area) is red and tender and swollen.

## 2017-08-06 NOTE — ED Triage Notes (Signed)
Pt c/o right foot/leg pain with swelling and redness, sent here from Podiatry

## 2017-08-07 ENCOUNTER — Encounter: Payer: Self-pay | Admitting: Podiatry

## 2017-08-07 ENCOUNTER — Telehealth: Payer: Self-pay | Admitting: *Deleted

## 2017-08-07 NOTE — Telephone Encounter (Signed)
Patient was sent to the ER yesterday by Dr. Caffie Pinto after evaluating her foot for infection. Called patient today to see how she was doing and to schedule a f/u appointment with her. Patient states she already has an appointment with her PCP. She states she will call us back to schedule an appointment after she sees her PCP.

## 2017-08-07 NOTE — Progress Notes (Signed)
Subjective: 72 y.o. year old female patient presents complaining of pain and swelling in right. Stated that her pain is going up to groin area. She noted of something on bottom of right foot the other day. It has gotten much worse since last night.  Positive history of back surgery in 1996 and 1994. Loss of feelings in her lower limbs below knee level.  Objective: Dermatologic: Red cellulitic right lower limb extending above knee with abscessed first MPJ plantar right without drainage. Vascular: Pedal pulses are all palpable. Orthopedic: Was noted of plantar flexed first MPJ with arch arched cavus type foot and collapsed medial longitudinal arch of left foot. Neurologic: Diagnosed with Peripheral neuropathy.  Assessment: Infected right foot with ascending cellulitis right lower limb.  Treatment: Reviewed findings and urgency of immediate medical care, X-ray, blood work, IV antibiotics. Explained that the necessary treatment can be rendered best at ER setting.  Patient understands this. Patient was sent to ER immediately.

## 2017-08-07 NOTE — Patient Instructions (Signed)
Seen for painful right foot. Noted of infected right foot with ascending cellulitis right lower limb. Advised to check in at ER immediately for proper medical care.

## 2017-08-08 ENCOUNTER — Other Ambulatory Visit: Payer: Self-pay

## 2017-08-08 ENCOUNTER — Emergency Department (HOSPITAL_BASED_OUTPATIENT_CLINIC_OR_DEPARTMENT_OTHER): Payer: Medicare HMO

## 2017-08-08 ENCOUNTER — Ambulatory Visit: Payer: Medicare HMO | Admitting: Medical

## 2017-08-08 ENCOUNTER — Encounter (HOSPITAL_BASED_OUTPATIENT_CLINIC_OR_DEPARTMENT_OTHER): Payer: Self-pay | Admitting: Emergency Medicine

## 2017-08-08 ENCOUNTER — Inpatient Hospital Stay (HOSPITAL_BASED_OUTPATIENT_CLINIC_OR_DEPARTMENT_OTHER)
Admission: EM | Admit: 2017-08-08 | Discharge: 2017-08-12 | DRG: 571 | Disposition: A | Discharge status: Home Health | Payer: Medicare HMO | Attending: Internal Medicine | Admitting: Internal Medicine

## 2017-08-08 DIAGNOSIS — Z981 Arthrodesis status: Secondary | ICD-10-CM | POA: Diagnosis not present

## 2017-08-08 DIAGNOSIS — K219 Gastro-esophageal reflux disease without esophagitis: Secondary | ICD-10-CM | POA: Diagnosis not present

## 2017-08-08 DIAGNOSIS — G629 Polyneuropathy, unspecified: Secondary | ICD-10-CM | POA: Diagnosis not present

## 2017-08-08 DIAGNOSIS — E11622 Type 2 diabetes mellitus with other skin ulcer: Secondary | ICD-10-CM | POA: Diagnosis not present

## 2017-08-08 DIAGNOSIS — L089 Local infection of the skin and subcutaneous tissue, unspecified: Secondary | ICD-10-CM | POA: Diagnosis not present

## 2017-08-08 DIAGNOSIS — E162 Hypoglycemia, unspecified: Secondary | ICD-10-CM

## 2017-08-08 DIAGNOSIS — M7989 Other specified soft tissue disorders: Secondary | ICD-10-CM | POA: Diagnosis not present

## 2017-08-08 DIAGNOSIS — L02611 Cutaneous abscess of right foot: Principal | ICD-10-CM | POA: Diagnosis present

## 2017-08-08 DIAGNOSIS — Z7984 Long term (current) use of oral hypoglycemic drugs: Secondary | ICD-10-CM

## 2017-08-08 DIAGNOSIS — E11621 Type 2 diabetes mellitus with foot ulcer: Secondary | ICD-10-CM | POA: Diagnosis not present

## 2017-08-08 DIAGNOSIS — Z79891 Long term (current) use of opiate analgesic: Secondary | ICD-10-CM | POA: Diagnosis not present

## 2017-08-08 DIAGNOSIS — Z79899 Other long term (current) drug therapy: Secondary | ICD-10-CM | POA: Diagnosis not present

## 2017-08-08 DIAGNOSIS — E785 Hyperlipidemia, unspecified: Secondary | ICD-10-CM | POA: Diagnosis not present

## 2017-08-08 DIAGNOSIS — L97909 Non-pressure chronic ulcer of unspecified part of unspecified lower leg with unspecified severity: Secondary | ICD-10-CM | POA: Diagnosis not present

## 2017-08-08 DIAGNOSIS — L03115 Cellulitis of right lower limb: Secondary | ICD-10-CM | POA: Diagnosis not present

## 2017-08-08 DIAGNOSIS — L97519 Non-pressure chronic ulcer of other part of right foot with unspecified severity: Secondary | ICD-10-CM | POA: Diagnosis present

## 2017-08-08 DIAGNOSIS — I10 Essential (primary) hypertension: Secondary | ICD-10-CM | POA: Diagnosis not present

## 2017-08-08 DIAGNOSIS — L0390 Cellulitis, unspecified: Secondary | ICD-10-CM | POA: Diagnosis present

## 2017-08-08 DIAGNOSIS — R600 Localized edema: Secondary | ICD-10-CM | POA: Diagnosis not present

## 2017-08-08 DIAGNOSIS — E1142 Type 2 diabetes mellitus with diabetic polyneuropathy: Secondary | ICD-10-CM | POA: Diagnosis present

## 2017-08-08 DIAGNOSIS — Z79890 Hormone replacement therapy (postmenopausal): Secondary | ICD-10-CM | POA: Diagnosis not present

## 2017-08-08 DIAGNOSIS — G709 Myoneural disorder, unspecified: Secondary | ICD-10-CM | POA: Diagnosis present

## 2017-08-08 DIAGNOSIS — Z882 Allergy status to sulfonamides status: Secondary | ICD-10-CM | POA: Diagnosis not present

## 2017-08-08 DIAGNOSIS — D638 Anemia in other chronic diseases classified elsewhere: Secondary | ICD-10-CM | POA: Diagnosis present

## 2017-08-08 DIAGNOSIS — Z7982 Long term (current) use of aspirin: Secondary | ICD-10-CM

## 2017-08-08 DIAGNOSIS — G589 Mononeuropathy, unspecified: Secondary | ICD-10-CM | POA: Diagnosis present

## 2017-08-08 DIAGNOSIS — G8929 Other chronic pain: Secondary | ICD-10-CM | POA: Diagnosis present

## 2017-08-08 DIAGNOSIS — L0291 Cutaneous abscess, unspecified: Secondary | ICD-10-CM | POA: Diagnosis not present

## 2017-08-08 DIAGNOSIS — M79671 Pain in right foot: Secondary | ICD-10-CM | POA: Diagnosis not present

## 2017-08-08 DIAGNOSIS — E1140 Type 2 diabetes mellitus with diabetic neuropathy, unspecified: Secondary | ICD-10-CM

## 2017-08-08 HISTORY — DX: Other chronic pain: G89.29

## 2017-08-08 LAB — BASIC METABOLIC PANEL
Anion gap: 10 (ref 5–15)
BUN: 13 mg/dL (ref 6–20)
CALCIUM: 8.9 mg/dL (ref 8.9–10.3)
CO2: 28 mmol/L (ref 22–32)
CREATININE: 0.72 mg/dL (ref 0.44–1.00)
Chloride: 100 mmol/L — ABNORMAL LOW (ref 101–111)
GFR calc Af Amer: >60 mL/min (ref >60)
GFR calc non Af Amer: >60 mL/min (ref >60)
GLUCOSE: 132 mg/dL — AB (ref 65–99)
Potassium: 3.5 mmol/L (ref 3.5–5.1)
Sodium: 138 mmol/L (ref 135–145)

## 2017-08-08 LAB — CBC WITH DIFFERENTIAL/PLATELET
BASOS PCT: 0 %
Basophils Absolute: 0 10*3/uL (ref 0.0–0.1)
EOS ABS: 0.1 10*3/uL (ref 0.0–0.7)
EOS PCT: 1 %
HCT: 33.3 % — ABNORMAL LOW (ref 36.0–46.0)
Hemoglobin: 11.2 g/dL — ABNORMAL LOW (ref 12.0–15.0)
Lymphocytes Relative: 11 %
Lymphs Abs: 1.2 10*3/uL (ref 0.7–4.0)
MCH: 29.3 pg (ref 26.0–34.0)
MCHC: 33.6 g/dL (ref 30.0–36.0)
MCV: 87.2 fL (ref 78.0–100.0)
MONO ABS: 1 10*3/uL (ref 0.1–1.0)
Monocytes Relative: 10 %
Neutro Abs: 8.1 10*3/uL — ABNORMAL HIGH (ref 1.7–7.7)
Neutrophils Relative %: 78 %
PLATELETS: 241 10*3/uL (ref 150–400)
RBC: 3.82 MIL/uL — ABNORMAL LOW (ref 3.87–5.11)
RDW: 12.8 % (ref 11.5–15.5)
WBC: 10.4 10*3/uL (ref 4.0–10.5)

## 2017-08-08 LAB — I-STAT CG4 LACTIC ACID, ED: Lactic Acid, Venous: 1.67 mmol/L (ref 0.5–1.9)

## 2017-08-08 LAB — HEMOGLOBIN A1C
HEMOGLOBIN A1C: 6.2 % — AB (ref 4.8–5.6)
Mean Plasma Glucose: 131.24 mg/dL

## 2017-08-08 LAB — GLUCOSE, CAPILLARY
Glucose-Capillary: 135 mg/dL — ABNORMAL HIGH (ref 65–99)
Glucose-Capillary: 94 mg/dL (ref 65–99)

## 2017-08-08 MED ORDER — ADULT MULTIVITAMIN W/MINERALS CH
1.0000 | ORAL_TABLET | Freq: Every day | ORAL | Status: DC
  Administered 2017-08-09 – 2017-08-12 (×4): 1 via ORAL
  Filled 2017-08-08 (×4): qty 1

## 2017-08-08 MED ORDER — PANTOPRAZOLE SODIUM 40 MG PO TBEC
40.0000 mg | DELAYED_RELEASE_TABLET | Freq: Every day | ORAL | Status: DC
  Administered 2017-08-09 – 2017-08-12 (×4): 40 mg via ORAL
  Filled 2017-08-08 (×4): qty 1

## 2017-08-08 MED ORDER — TIZANIDINE HCL 4 MG PO TABS
4.0000 mg | ORAL_TABLET | Freq: Three times a day (TID) | ORAL | Status: DC
  Administered 2017-08-08 – 2017-08-12 (×12): 4 mg via ORAL
  Filled 2017-08-08 (×12): qty 1

## 2017-08-08 MED ORDER — LINAGLIPTIN 5 MG PO TABS
5.0000 mg | ORAL_TABLET | Freq: Every day | ORAL | Status: DC
Start: 2017-08-09 — End: 2017-08-12
  Administered 2017-08-09 – 2017-08-12 (×4): 5 mg via ORAL
  Filled 2017-08-08 (×4): qty 1

## 2017-08-08 MED ORDER — DOCUSATE SODIUM 100 MG PO CAPS
100.0000 mg | ORAL_CAPSULE | Freq: Two times a day (BID) | ORAL | Status: DC
  Administered 2017-08-08 – 2017-08-12 (×8): 100 mg via ORAL
  Filled 2017-08-08 (×8): qty 1

## 2017-08-08 MED ORDER — ASPIRIN 81 MG PO CHEW
81.0000 mg | CHEWABLE_TABLET | Freq: Every day | ORAL | Status: DC
  Administered 2017-08-08 – 2017-08-11 (×4): 81 mg via ORAL
  Filled 2017-08-08 (×4): qty 1

## 2017-08-08 MED ORDER — AMLODIPINE BESYLATE 5 MG PO TABS
5.0000 mg | ORAL_TABLET | Freq: Every day | ORAL | Status: DC
  Administered 2017-08-09 – 2017-08-12 (×4): 5 mg via ORAL
  Filled 2017-08-08 (×4): qty 1

## 2017-08-08 MED ORDER — SODIUM CHLORIDE 0.9 % IV BOLUS
1000.0000 mL | Freq: Once | INTRAVENOUS | Status: AC
  Administered 2017-08-08: 1000 mL via INTRAVENOUS

## 2017-08-08 MED ORDER — FOLIC ACID 800 MCG PO TABS: 400.0000 ug | ORAL_TABLET | Freq: Every day | ORAL | Status: DC

## 2017-08-08 MED ORDER — FOLIC ACID 1 MG PO TABS
0.5000 mg | ORAL_TABLET | Freq: Every day | ORAL | Status: DC
Start: 2017-08-09 — End: 2017-08-12
  Administered 2017-08-09 – 2017-08-12 (×4): 0.5 mg via ORAL
  Filled 2017-08-08 (×4): qty 1

## 2017-08-08 MED ORDER — BISOPROLOL-HYDROCHLOROTHIAZIDE 10-6.25 MG PO TABS
1.0000 | ORAL_TABLET | Freq: Every day | ORAL | Status: DC
  Administered 2017-08-09: 1 via ORAL
  Filled 2017-08-08: qty 1

## 2017-08-08 MED ORDER — ONDANSETRON HCL 4 MG/2ML IJ SOLN
4.0000 mg | Freq: Four times a day (QID) | INTRAMUSCULAR | Status: DC | PRN
  Administered 2017-08-10: 4 mg via INTRAVENOUS

## 2017-08-08 MED ORDER — INSULIN ASPART 100 UNIT/ML ~~LOC~~ SOLN
0.0000 [IU] | Freq: Three times a day (TID) | SUBCUTANEOUS | Status: DC
  Administered 2017-08-08: 2 [IU] via SUBCUTANEOUS
  Administered 2017-08-10: 5 [IU] via SUBCUTANEOUS
  Administered 2017-08-10: 8 [IU] via SUBCUTANEOUS
  Administered 2017-08-11: 3 [IU] via SUBCUTANEOUS
  Administered 2017-08-11: 2 [IU] via SUBCUTANEOUS

## 2017-08-08 MED ORDER — ACETAMINOPHEN 650 MG RE SUPP: 650.0000 mg | Freq: Four times a day (QID) | RECTAL | Status: DC | PRN

## 2017-08-08 MED ORDER — ESTRADIOL 1 MG PO TABS
1.0000 mg | ORAL_TABLET | Freq: Every day | ORAL | Status: DC
  Administered 2017-08-09 – 2017-08-12 (×4): 1 mg via ORAL
  Filled 2017-08-08 (×4): qty 1

## 2017-08-08 MED ORDER — ATORVASTATIN CALCIUM 10 MG PO TABS
10.0000 mg | ORAL_TABLET | Freq: Every day | ORAL | Status: DC
  Administered 2017-08-09 – 2017-08-12 (×4): 10 mg via ORAL
  Filled 2017-08-08 (×4): qty 1

## 2017-08-08 MED ORDER — ENOXAPARIN SODIUM 40 MG/0.4ML ~~LOC~~ SOLN
40.0000 mg | SUBCUTANEOUS | Status: DC
  Administered 2017-08-08 – 2017-08-11 (×3): 40 mg via SUBCUTANEOUS
  Filled 2017-08-08 (×4): qty 0.4

## 2017-08-08 MED ORDER — MORPHINE SULFATE (PF) 4 MG/ML IV SOLN
4.0000 mg | Freq: Once | INTRAVENOUS | Status: AC
  Administered 2017-08-08: 4 mg via INTRAVENOUS
  Filled 2017-08-08: qty 1

## 2017-08-08 MED ORDER — ONDANSETRON HCL 4 MG PO TABS: 4.0000 mg | ORAL_TABLET | Freq: Four times a day (QID) | ORAL | Status: DC | PRN

## 2017-08-08 MED ORDER — GABAPENTIN 400 MG PO CAPS
800.0000 mg | ORAL_CAPSULE | Freq: Four times a day (QID) | ORAL | Status: DC
  Administered 2017-08-08 – 2017-08-12 (×16): 800 mg via ORAL
  Filled 2017-08-08 (×20): qty 2

## 2017-08-08 MED ORDER — POLYETHYLENE GLYCOL 3350 17 G PO PACK
17.0000 g | PACK | Freq: Every day | ORAL | Status: DC | PRN
  Administered 2017-08-11: 17 g via ORAL
  Filled 2017-08-08: qty 1

## 2017-08-08 MED ORDER — MORPHINE SULFATE (PF) 2 MG/ML IV SOLN
2.0000 mg | INTRAVENOUS | Status: DC | PRN
  Administered 2017-08-09 – 2017-08-12 (×5): 2 mg via INTRAVENOUS
  Filled 2017-08-08 (×5): qty 1

## 2017-08-08 MED ORDER — ASPIRIN 81 MG PO TABS: 81.0000 mg | ORAL_TABLET | Freq: Every day | ORAL | Status: DC

## 2017-08-08 MED ORDER — PIPERACILLIN-TAZOBACTAM 3.375 G IVPB
3.3750 g | Freq: Three times a day (TID) | INTRAVENOUS | Status: DC
  Administered 2017-08-08 – 2017-08-12 (×11): 3.375 g via INTRAVENOUS
  Filled 2017-08-08 (×13): qty 50

## 2017-08-08 MED ORDER — INSULIN ASPART 100 UNIT/ML ~~LOC~~ SOLN: 0.0000 [IU] | Freq: Every day | SUBCUTANEOUS | Status: DC

## 2017-08-08 MED ORDER — PIPERACILLIN-TAZOBACTAM 3.375 G IVPB 30 MIN
3.3750 g | Freq: Once | INTRAVENOUS | Status: AC
  Administered 2017-08-08: 3.375 g via INTRAVENOUS
  Filled 2017-08-08 (×2): qty 50

## 2017-08-08 MED ORDER — ACETAMINOPHEN 325 MG PO TABS
650.0000 mg | ORAL_TABLET | Freq: Four times a day (QID) | ORAL | Status: DC | PRN
  Administered 2017-08-12: 650 mg via ORAL
  Filled 2017-08-08: qty 2

## 2017-08-08 MED ORDER — MORPHINE SULFATE CR 60 MG PO TB12: 60.0000 mg | ORAL_TABLET | Freq: Three times a day (TID) | ORAL | Status: DC

## 2017-08-08 MED ORDER — MORPHINE SULFATE ER 30 MG PO TBCR
60.0000 mg | EXTENDED_RELEASE_TABLET | Freq: Three times a day (TID) | ORAL | Status: DC
  Administered 2017-08-08 – 2017-08-12 (×13): 60 mg via ORAL
  Filled 2017-08-08 (×13): qty 2

## 2017-08-08 MED ORDER — VANCOMYCIN HCL IN DEXTROSE 1-5 GM/200ML-% IV SOLN
1000.0000 mg | Freq: Once | INTRAVENOUS | Status: AC
  Administered 2017-08-08: 1000 mg via INTRAVENOUS
  Filled 2017-08-08: qty 200

## 2017-08-08 MED ORDER — VANCOMYCIN HCL IN DEXTROSE 750-5 MG/150ML-% IV SOLN
750.0000 mg | Freq: Two times a day (BID) | INTRAVENOUS | Status: DC
  Administered 2017-08-09 – 2017-08-12 (×7): 750 mg via INTRAVENOUS
  Filled 2017-08-08 (×9): qty 150

## 2017-08-08 NOTE — Progress Notes (Addendum)
Pharmacy - Antibiotic note  Assessment:    Please see note from [place], PharmD earlier today for full details.  Briefly, 72 y.o. female started on Vancomycin for cellulitis associated w/ L foot ulcer after failing OP doxycycline.   PM update: MD would now like to add Zosyn  Plan:   Zosyn 3.375 g IV given once over 30 minutes, then every 8 hrs by 4-hr infusion  Daily SCr while on vancomycin and Zosyn combination  Continue vancomycin as ordered (AUC appropriate at 479 w/ SCr rounded to 0.8)  Reuel Boom, PharmD, BCPS [phone] 08/08/2017, 4:29 PM

## 2017-08-08 NOTE — H&P (Signed)
History and Physical    Brittany [name] WER:[id number] DOB: [birth year] DOA: 08/08/2017    PCP: Ann Held, DO  Patient coming from: home  Chief Complaint: right leg swelling, redness and ulcer on foot  HPI: Brittany [name] is a 72 y.o. female with medical history of non-insulin-requiring diabetes mellitus with neuropathy, chronic pain who follows at the pain clinic, hypertension and gastroesophageal reflux disease who presented to the ED on 5/1 with a left foot ulcer and cellulitis up above the knee.  She was sent by her podiatrist.  A venous duplex was negative for DVT she received Zosyn in the ED and was sent home with doxycycline.    Her symptoms continued to worsen and she presented to [facility] where she received a dose of vancomycin and was recommended to be admitted.  She is transferred to [facility] to be admitted.  She has severe neuropathy and is unable to feel her legs below the knee.  She noticed the ulcer present on her foot a few days ago.  He states that there has been some clear foul smelling discharge from it which mostly comes out when she walks on it.  She has not had any fever or chills or sweats.  ED Course: See above Blood cultures obtained on 5/1 are negative to date  Review of Systems:  All other systems reviewed and apart from HPI, are negative.  Past Medical History:  Diagnosis Date  . Chronic pain    In pain clinic   . Diabetes mellitus   . GERD (gastroesophageal reflux disease)   . Hypertension   . Neuropathy     Past Surgical History:  Procedure Laterality Date  . LUMBAR FUSION      Social History:   reports that she has never smoked. She has never used smokeless tobacco. She reports that she does not drink alcohol or use drugs.  Is retired from an office job  [employer]  . Sulfonamide Derivatives     REACTION: HIVES    Family History  Problem Relation Age of Onset  . Cancer Mother    breast  . Depression Mother        bipolar  . Breast cancer Mother 7  . Heart disease Father 76       MI  . Diabetes Brother   . Kidney disease Brother   . Hypertension Brother   . Hyperlipidemia Brother   . Stroke Brother   . Coronary artery disease Unknown   . Diabetes Unknown      Prior to Admission medications   Medication Sig Start Date End Date Taking? Authorizing Provider  amLODipine (NORVASC) 5 MG tablet TAKE 1 TABLET DAILY 06/24/17  Yes Roma Schanz R, DO  aspirin 81 MG tablet Take 81 mg by mouth at bedtime.    Yes [provider]  atorvastatin (LIPITOR) 10 MG tablet Take 1 tablet (10 mg total) by mouth daily. Patient taking differently: Take 10 mg by mouth every morning.  06/18/17  Yes Ann Held, DO  bisoprolol-hydrochlorothiazide [facility]) 10-6.25 MG tablet TAKE 1 TABLET DAILY 10/24/16  Yes Roma Schanz R, DO  calcium carbonate (TUMS EX) 750 MG chewable tablet Chew 1 tablet by mouth daily. For acid reflux    Yes [provider]  Cholecalciferol (VITAMIN D) 1000 UNITS capsule Take 1,000 Units by mouth daily.   Yes [provider]  desonide (DESOWEN) 0.05 % cream Apply  1 application topically as needed.   Yes [provider]  docusate sodium (COLACE) 100 MG capsule Take 100 mg by mouth 2 (two) times daily.   Yes [provider]  doxepin (SINEQUAN) 25 MG capsule Take 1 capsule by mouth as needed. 09/11/16  Yes [provider]  doxycycline (VIBRAMYCIN) 100 MG capsule Take 1 capsule (100 mg total) by mouth 2 (two) times daily. One po bid x 7 days 08/06/17  Yes Sherwood Gambler, MD  escitalopram (LEXAPRO) 10 MG tablet Take 1 tablet (10 mg total) by mouth daily. 06/13/17  Yes Roma Schanz R, DO  estradiol (ESTRACE) 1 MG tablet TAKE 1 TABLET DAILY 06/13/17  Yes Ann Held, DO  folic acid (FOLVITE) 283 MCG tablet Take 400 mcg by mouth daily.   Yes [provider]  furosemide (LASIX) 40 MG  tablet Take 1 tablet (40 mg total) by mouth daily. 07/09/17  Yes Roma Schanz R, DO  gabapentin (NEURONTIN) 800 MG tablet Take 1 tablet (800 mg total) by mouth 4 (four) times daily. 05/04/15  Yes Roma Schanz R, DO  l-methylfolate-B6-B12 (METANX) 3-35-2 MG TABS Take 1 tablet by mouth daily.   Yes [provider]  morphine (MS CONTIN) 60 MG 12 hr tablet Take 60 mg by mouth 3 (three) times daily. 04/22/11  Yes Ann Held, DO  Multiple Vitamin (MULTIVITAMIN WITH MINERALS) TABS Take 1 tablet by mouth daily.   Yes [provider]  omega-3 acid ethyl esters (LOVAZA) 1 G capsule Take 1 g by mouth daily.   Yes [provider]  omeprazole (PRILOSEC) 40 MG capsule TAKE 1 CAPSULE DAILY 06/13/17  Yes Lowne Chase, Yvonne R, DO  OVER THE COUNTER MEDICATION Take 1 tablet by mouth daily. Hair, skin, and nails vitamin   Yes [provider]  potassium chloride SA (K-DUR,KLOR-CON) 20 MEQ tablet Take 1 tablet (20 mEq total) by mouth daily. 07/09/17  Yes Ann Held, DO  sitaGLIPtin-metformin (JANUMET) 50-1000 MG tablet Take 2 tablets po at bedtime daily 07/09/17  Yes Roma Schanz R, DO  tiZANidine (ZANAFLEX) 4 MG tablet Take 1 tablet by mouth 3 (three) times daily.  08/26/10  Yes [provider]  Blood Glucose Monitoring Suppl (ONE TOUCH ULTRA MINI) w/Device KIT Use as directed once a day.  Dx Code:  E11.42 05/01/17   Roma Schanz R, DO  glucose blood (ONE TOUCH ULTRA TEST) test strip Check Blood sugar once daily 02/07/17   Carollee Herter, Alferd Apa, DO  loratadine (CLARITIN) 10 MG tablet Take 10 mg by mouth daily as needed. For allergies    [provider]  [facility] DELICA LANCETS 66Q MISC Check blood sugar once daily 02/07/17   Ann Held, DO    Physical Exam: Wt Readings from Last 3 Encounters:  08/08/17 78 kg (172 lb)  08/06/17 78 kg (172 lb)  06/13/17 84.1 kg (185 lb 6.4 oz)   Vitals:   08/08/17 1253 08/08/17  1410 08/08/17 1415 08/08/17 1555  BP: (!) 105/59  (!) 105/47 90/63  Pulse: 66  66 66  Resp:    16  Temp:  98.1 F (36.7 C)  99 F (37.2 C)  TempSrc:  Oral  Oral  SpO2: 99%  94% 96%  Weight:      Height:          Constitutional: NAD, calm, comfortable Eyes: PERTLA, lids and conjunctivae normal ENMT: Mucous membranes are moist. Posterior pharynx  clear of any exudate or lesions. Normal dentition.  Neck: normal, supple, no masses, no thyromegaly Respiratory: clear to auscultation bilaterally, no wheezing, no crackles. Normal respiratory effort. No accessory muscle use.  Cardiovascular: S1 & S2 heard, regular rate and rhythm, no murmurs / rubs / gallops. No extremity edema. 2+ pedal pulses. No carotid bruits.  Abdomen: No distension, no tenderness, no masses palpated. No hepatosplenomegaly. Bowel sounds normal.  Musculoskeletal: no clubbing / cyanosis. No joint deformity upper and lower extremities. Good ROM, no contractures. Normal muscle tone.  Skin: See below-she also has a scab on her left knee where she fell a few days ago Neurologic: CN 2-12 grossly intact. Sensation intact, DTR normal. Strength 5/5 in all 4 limbs.  Psychiatric: Normal judgment and insight. Alert and oriented x 3. Normal mood.           Labs on Admission: I have personally reviewed following labs and imaging studies  CBC: Recent Labs  Lab 08/06/17 1626 08/08/17 1119  WBC 9.9 10.4  NEUTROABS 7.4 8.1*  HGB 11.3* 11.2*  HCT 34.3* 33.3*  MCV 87.7 87.2  PLT 231 503   Basic Metabolic Panel: Recent Labs  Lab 08/06/17 1626 08/08/17 1119  NA 135 138  K 3.3* 3.5  CL 98* 100*  CO2 27 28  GLUCOSE 200* 132*  BUN 15 13  CREATININE 0.59 0.72  CALCIUM 8.7* 8.9   GFR: Estimated Creatinine Clearance: 69.4 mL/min (by C-G formula based on SCr of 0.72 mg/dL). Liver Function Tests: Recent Labs  Lab 08/06/17 1626  AST 18  ALT 11*  ALKPHOS 47  BILITOT 0.5  PROT 7.1  ALBUMIN 3.4*   No results for  input(s): LIPASE, AMYLASE in the last 168 hours. No results for input(s): AMMONIA in the last 168 hours. Coagulation Profile: No results for input(s): INR, PROTIME in the last 168 hours. Cardiac Enzymes: No results for input(s): CKTOTAL, CKMB, CKMBINDEX, TROPONINI in the last 168 hours. BNP (last 3 results) No results for input(s): PROBNP in the last 8760 hours. HbA1C: No results for input(s): HGBA1C in the last 72 hours. CBG: No results for input(s): GLUCAP in the last 168 hours. Lipid Profile: No results for input(s): CHOL, HDL, LDLCALC, TRIG, CHOLHDL, LDLDIRECT in the last 72 hours. Thyroid Function Tests: No results for input(s): TSH, T4TOTAL, FREET4, T3FREE, THYROIDAB in the last 72 hours. Anemia Panel: No results for input(s): VITAMINB12, FOLATE, FERRITIN, TIBC, IRON, RETICCTPCT in the last 72 hours. Urine analysis:    Component Value Date/Time   BILIRUBINUR neg 03/11/2016 1030   PROTEINUR neg 03/11/2016 1030   UROBILINOGEN 0.2 03/11/2016 1030   NITRITE neg 03/11/2016 1030   LEUKOCYTESUR large (3+) (A) 03/11/2016 1030   Sepsis Labs: @LABRCNTIP (procalcitonin:4,lacticidven:4) ) Recent Results (from the past 240 hour(s))  Blood Culture (routine x 2)     Status: None (Preliminary result)   Collection Time: 08/06/17  4:12 PM  Result Value Ref Range Status   Specimen Description   Final    BLOOD RIGHT ANTECUBITAL Performed at [facility], [address] [place], [place] [address]    Special Requests   Final    BOTTLES DRAWN AEROBIC AND ANAEROBIC Blood Culture adequate volume Performed at [facility], [address] [place], [place] [address]    Culture   Final    NO GROWTH 2 DAYS Performed at [facility], [place] [address] [place], [place] [address]    Report Status PENDING  Incomplete  Blood Culture (routine x 2)     Status: None (Preliminary result)   Collection Time: 08/06/17  4:23 PM  Result Value Ref Range Status   Specimen  Description   Final    BLOOD BLOOD LEFT HAND Performed at [facility], [address] [place], [place] [address]    Special Requests   Final    BOTTLES DRAWN AEROBIC AND ANAEROBIC Blood Culture adequate volume Performed at [facility], [address] [place], [place] [address]    Culture   Final    NO GROWTH 2 DAYS Performed at [facility], [place] [address], [place], [place] [address]    Report Status PENDING  Incomplete     Radiological Exams on Admission: US Venous Img Lower Unilateral Right  Result Date: 08/06/2017 CLINICAL DATA:  Right lower extremity swelling, erythema, and pain for 2 days. EXAM: RIGHT LOWER EXTREMITY VENOUS DOPPLER ULTRASOUND TECHNIQUE: Gray-scale sonography with graded compression, as well as color Doppler and duplex ultrasound were performed to evaluate the lower extremity deep venous systems from the level of the common femoral vein and including the common femoral, femoral, profunda femoral, popliteal and calf veins including the posterior tibial, peroneal and gastrocnemius veins when visible. The superficial great saphenous vein was also interrogated. Spectral Doppler was utilized to evaluate flow at rest and with distal augmentation maneuvers in the common femoral, femoral and popliteal veins. COMPARISON:  None. FINDINGS: Contralateral Common Femoral Vein: Respiratory phasicity is normal and symmetric with the symptomatic side. No evidence of thrombus. Normal compressibility. Common Femoral Vein: No evidence of thrombus. Normal compressibility, respiratory phasicity and response to augmentation. Saphenofemoral Junction: No evidence of thrombus. Normal compressibility and flow on color Doppler imaging. Profunda Femoral Vein: No evidence of thrombus. Normal compressibility and flow on color Doppler imaging. Femoral Vein: No evidence of thrombus. Normal compressibility, respiratory phasicity and response to augmentation. Popliteal Vein: No  evidence of thrombus. Normal compressibility, respiratory phasicity and response to augmentation. Calf Veins: No evidence of thrombus. Normal compressibility and flow on color Doppler imaging. Superficial Great Saphenous Vein: No evidence of thrombus. Normal compressibility. Venous Reflux:  None. Other Findings:  None. IMPRESSION: No evidence of deep venous thrombosis. Electronically Signed   By: Earle Gell M.D.   On: 08/06/2017 18:42   Dg Foot Complete Right  Result Date: 08/08/2017 CLINICAL DATA:  Progressive right foot pain, swelling, and tenderness. No known injury. EXAM: RIGHT FOOT COMPLETE - 3+ VIEW COMPARISON:  Radiographs dated 08/06/2017 FINDINGS: There is new soft tissue swelling over the first MTP joint and over the dorsum of the foot. No visible gas in the soft tissues. Chronic arthritic changes at the first MTP joint as well as in the IP joints of the toes, unchanged. Postsurgical changes in the distal first metatarsal. Chronic arthritic changes at the midfoot, stable. IMPRESSION: New soft tissue swelling of the dorsum of the foot and adjacent to the first MTP joint. 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 No visible osteomyelitis or gas in the soft tissues. Electronically Signed   By: Lorriane Shire M.D.   On: 08/08/2017 11:35     Assessment/Plan Principal Problem:   Abscess/  Cellulitis -Abscess under head of metatarsal with cellulitis extending above the knee and tenderness into the right groin suggesting spread to lymph nodes -No leukocytosis or lactic acidosis-temperature is 99 -  will call orthopedic surgery for debridement and hopefully send a culture -Empiric vancomycin and Zosyn  for now -Blood cultures negative thus far  Active Problems:   Essential hypertension -Continue bisoprolol HCTZ -Hold Lasix which she states she is on for pedal edema  Diabetes mellitus type 2  -A1c 7.0 on 2/19-she states she has been diabetic for about 4 to 5  years -Continue Januvia (replaced by Tradjenta)-hold Glucophage-placed on moderate dose sliding scale   Peripheral neuropathy -This is not secondary to her diabetes which appears to be controlled-she states she was told it "coming from her back"  -Continue Neurontin, tizanidine, morphine-she follows with outpatient pain clinic    DVT prophylaxis: Lovenox Code Status: Full code  this can be something else we can do Family Communication:   Disposition Plan: Admit to [place] called: Orthopedic surgery Admission status: Inpatient   Debbe Odea MD [facility] Pager: [email or web address] Password [email or web address] 7PM-7AM, please contact night-coverage   08/08/2017, 5:09 PM

## 2017-08-08 NOTE — Progress Notes (Addendum)
Pharmacy Antibiotic Note  Brittany [name] is a 72 y.o. female admitted on 08/08/2017 with cellulitis.  Pharmacy has been consulted for vancomycin dosing.  Previously treated with doxycycline outpatient for 2 days without improvement.  PMH includes diabetes and neuropathy.  Vancomycin 1G load given in [facility] ED.  Plan: - vancomycin 750 mg every 12 hours - goal trough 10-15 mcg/ml - monitor clinical progression, length of therapy, renal function, and vancomycin trough as needed  Height: 5\' 9"  (175.3 cm) Weight: 172 lb (78 kg) IBW/kg (Calculated) : 66.2  Temp (24hrs), Avg:98.8 F (37.1 C), Min:98.8 F (37.1 C), Max:98.8 F (37.1 C)  Recent Labs  Lab 08/06/17 1626 08/06/17 1635 08/08/17 1119 08/08/17 1127  WBC 9.9  --  10.4  --   CREATININE 0.59  --  0.72  --   LATICACIDVEN  --  1.78  --  1.67    Estimated Creatinine Clearance: 69.4 mL/min (by C-G formula based on SCr of 0.72 mg/dL).    Allergies  Allergen Reactions  . Sulfonamide Derivatives     REACTION: HIVES    Antimicrobials this admission: Vancomycin 5/3 >>   Dose adjustments this admission: N/A  Microbiology results: 5/1 BCx: no growth <24 hours  Thank you for allowing pharmacy to be a part of this patient's care.  Deboraha Sprang, PharmD 08/08/2017 1:03 PM

## 2017-08-08 NOTE — ED Provider Notes (Signed)
[place] EMERGENCY DEPARTMENT Provider Note   CSN: [id number] Arrival date & time: 08/08/17  1034     History   Chief Complaint No chief complaint on file.   HPI Brittany [name] is a 72 y.o. female past medical history of diabetes, GERD, hypertension who presents for evaluation of worsening pain, redness, swelling noted to right foot.  Patient was seen in the ED on 08/06/2017 for evaluation of symptoms.  At that time, she was given a dose of IV antibiotics.  Patient's labs were reassuring.  She had a negative DVT study.  ED provider discussed with patient regarding possibility of admission but patient did not want to be admitted at that time.  She was sent home on outpatient doxycycline which she states she has been taking.  Patient states that the redness and pain have worsened.  Additionally, patient reports that swelling has been grossly worsened up through the leg.  She has noticed some drainage from the area.  She states she has not been having fevers but did start having some episodes of vomiting today.  Patient denies any chest pain, difficulty breathing, abdominal pain.  The history is provided by the patient.    Past Medical History:  Diagnosis Date  . Chronic pain    In pain clinic   . Diabetes mellitus   . GERD (gastroesophageal reflux disease)   . Hypertension   . Neuropathy     Patient Active Problem List   Diagnosis Date Noted  . Cellulitis 08/08/2017  . Hyperlipidemia LDL goal <70 09/22/2016  . Tooth pain 09/03/2012  . Edema 09/03/2012  . Supraclavicular fossa fullness 06/18/2012  . Hair loss 09/18/2010  . Fatigue 09/18/2010  . DYSPEPSIA&OTHER [facility] DISORDERS FUNCTION STOMACH 05/08/2010  . DIARRHEA 05/08/2010  . DIZZINESS 04/19/2010  . OTHER ACUTE SINUSITIS 02/28/2010  . NAUSEA 02/28/2010  . KNEE PAIN, LEFT 02/15/2010  . Type 2 diabetes mellitus with diabetic polyneuropathy, without long-term current use of insulin ([place]) 08/10/2009  . URI  07/09/2007  . GERD 02/12/2007  . LOW BACK PAIN 02/12/2007  . NEUROPATHY 09/03/2006  . Essential hypertension 09/03/2006  . HOT FLASHES 09/03/2006  . INSOMNIA 09/03/2006    Past Surgical History:  Procedure Laterality Date  . LUMBAR FUSION       OB History   None      Home Medications    Prior to Admission medications   Medication Sig Start Date End Date Taking? Authorizing Provider  amLODipine (NORVASC) 5 MG tablet TAKE 1 TABLET DAILY 06/24/17  Yes Roma Schanz R, DO  aspirin 81 MG tablet Take 81 mg by mouth at bedtime.    Yes [provider]  atorvastatin (LIPITOR) 10 MG tablet Take 1 tablet (10 mg total) by mouth daily. Patient taking differently: Take 10 mg by mouth every morning.  06/18/17  Yes Ann Held, DO  bisoprolol-hydrochlorothiazide [facility]) 10-6.25 MG tablet TAKE 1 TABLET DAILY 10/24/16  Yes Roma Schanz R, DO  calcium carbonate (TUMS EX) 750 MG chewable tablet Chew 1 tablet by mouth daily. For acid reflux    Yes [provider]  Cholecalciferol (VITAMIN D) 1000 UNITS capsule Take 1,000 Units by mouth daily.   Yes [provider]  desonide (DESOWEN) 0.05 % cream Apply 1 application topically as needed.   Yes [provider]  docusate sodium (COLACE) 100 MG capsule Take 100 mg by mouth 2 (two) times daily.   Yes [provider]  doxepin [facility])  25 MG capsule Take 1 capsule by mouth as needed. 09/11/16  Yes [provider]  doxycycline (VIBRAMYCIN) 100 MG capsule Take 1 capsule (100 mg total) by mouth 2 (two) times daily. One po bid x 7 days 08/06/17  Yes Sherwood Gambler, MD  escitalopram (LEXAPRO) 10 MG tablet Take 1 tablet (10 mg total) by mouth daily. 06/13/17  Yes Roma Schanz R, DO  estradiol (ESTRACE) 1 MG tablet TAKE 1 TABLET DAILY 06/13/17  Yes Ann Held, DO  folic acid (FOLVITE) 644 MCG tablet Take 400 mcg by mouth daily.   Yes [provider]  furosemide (LASIX)  40 MG tablet Take 1 tablet (40 mg total) by mouth daily. 07/09/17  Yes Roma Schanz R, DO  gabapentin (NEURONTIN) 800 MG tablet Take 1 tablet (800 mg total) by mouth 4 (four) times daily. 05/04/15  Yes Roma Schanz R, DO  l-methylfolate-B6-B12 (METANX) 3-35-2 MG TABS Take 1 tablet by mouth daily.   Yes [provider]  morphine (MS CONTIN) 60 MG 12 hr tablet Take 60 mg by mouth 3 (three) times daily. 04/22/11  Yes Ann Held, DO  Multiple Vitamin (MULTIVITAMIN WITH MINERALS) TABS Take 1 tablet by mouth daily.   Yes [provider]  omega-3 acid ethyl esters (LOVAZA) 1 G capsule Take 1 g by mouth daily.   Yes [provider]  omeprazole (PRILOSEC) 40 MG capsule TAKE 1 CAPSULE DAILY 06/13/17  Yes Lowne Chase, Yvonne R, DO  OVER THE COUNTER MEDICATION Take 1 tablet by mouth daily. Hair, skin, and nails vitamin   Yes [provider]  potassium chloride SA (K-DUR,KLOR-CON) 20 MEQ tablet Take 1 tablet (20 mEq total) by mouth daily. 07/09/17  Yes Ann Held, DO  sitaGLIPtin-metformin (JANUMET) 50-1000 MG tablet Take 2 tablets po at bedtime daily 07/09/17  Yes Roma Schanz R, DO  tiZANidine (ZANAFLEX) 4 MG tablet Take 1 tablet by mouth 3 (three) times daily.  08/26/10  Yes [provider]  Blood Glucose Monitoring Suppl (ONE TOUCH ULTRA MINI) w/Device KIT Use as directed once a day.  Dx Code:  E11.42 05/01/17   Roma Schanz R, DO  glucose blood (ONE TOUCH ULTRA TEST) test strip Check Blood sugar once daily 02/07/17   Carollee Herter, Alferd Apa, DO  loratadine (CLARITIN) 10 MG tablet Take 10 mg by mouth daily as needed. For allergies    [provider]  [facility] DELICA LANCETS 03K MISC Check blood sugar once daily 02/07/17   Ann Held, DO    Family History Family History  Problem Relation Age of Onset  . Cancer Mother        breast  . Depression Mother        bipolar  . Breast cancer Mother 62  . Heart  disease Father 72       MI  . Diabetes Brother   . Kidney disease Brother   . Hypertension Brother   . Hyperlipidemia Brother   . Stroke Brother   . Coronary artery disease Unknown   . Diabetes Unknown     Social History Social History   Tobacco Use  . Smoking status: Never Smoker  . Smokeless tobacco: Never Used  Substance Use Topics  . Alcohol use: No  . Drug use: No     Allergies   Sulfonamide derivatives   Review of Systems Review of Systems  Constitutional: Negative for fever.  Respiratory: Negative for cough  and shortness of breath.   Cardiovascular: Negative for chest pain.  Gastrointestinal: Negative for abdominal pain, nausea and vomiting.  Skin: Positive for color change and wound.  Neurological: Negative for weakness and numbness.  All other systems reviewed and are negative.    Physical Exam Updated Vital Signs BP (!) 105/47   Pulse 66   Temp 98.1 F (36.7 C) (Oral)   Resp 16   Ht 5' 9"  (1.753 m)   Wt 78 kg (172 lb)   SpO2 94%   BMI 25.40 kg/m   Physical Exam  Constitutional: She is oriented to person, place, and time. She appears well-developed and well-nourished.  HENT:  Head: Normocephalic and atraumatic.  Mouth/Throat: Oropharynx is clear and moist and mucous membranes are normal.  Eyes: Pupils are equal, round, and reactive to light. Conjunctivae, EOM and lids are normal.  Neck: Full passive range of motion without pain.  Cardiovascular: Normal rate, regular rhythm, normal heart sounds and normal pulses. Exam reveals no gallop and no friction rub.  No murmur heard. Pulses:      Radial pulses are 2+ on the right side, and 2+ on the left side.  Pulmonary/Chest: Effort normal and breath sounds normal.  Abdominal: Soft. Normal appearance. There is no tenderness. There is no rigidity and no guarding.  Musculoskeletal: Normal range of motion.  Tenderness palpation noted to the right foot.  Patient able to move all 5 digits right foot  without any difficulty.  Letter flexion and dorsiflexion of foot intact.  No abnormalities of left lower extremity.  Neurological: She is alert and oriented to person, place, and time.  Skin: Skin is warm and dry. Capillary refill takes less than 2 seconds.  2+ pitting edema noted to the right lower extremity from the mid calf region that extends distally.  There is diffuse warmth, erythema, induration overlying the entire distal right lower extremity, most notably on the dorsal aspect of the right foot.  On the medial plantar surface of the right foot, there is an area purulence that is question abscess versus blister. Good distal cap refill.  RLE is not dusky in appearance or cool to touch.  Psychiatric: She has a normal mood and affect. Her speech is normal.  Nursing note and vitals reviewed.            ED Treatments / Results  Labs (all labs ordered are listed, but only abnormal results are displayed) Labs Reviewed  BASIC METABOLIC PANEL - Abnormal; Notable for the following components:      Result Value   Chloride 100 (*)    Glucose, Bld 132 (*)    All other components within normal limits  CBC WITH DIFFERENTIAL/PLATELET - Abnormal; Notable for the following components:   RBC 3.82 (*)    Hemoglobin 11.2 (*)    HCT 33.3 (*)    Neutro Abs 8.1 (*)    All other components within normal limits  I-STAT CG4 LACTIC ACID, ED    EKG None  Radiology US Venous Img Lower Unilateral Right  Result Date: 08/06/2017 CLINICAL DATA:  Right lower extremity swelling, erythema, and pain for 2 days. EXAM: RIGHT LOWER EXTREMITY VENOUS DOPPLER ULTRASOUND TECHNIQUE: Gray-scale sonography with graded compression, as well as color Doppler and duplex ultrasound were performed to evaluate the lower extremity deep venous systems from the level of the common femoral vein and including the common femoral, femoral, profunda femoral, popliteal and calf veins including the posterior tibial, peroneal and  gastrocnemius veins when  visible. The superficial great saphenous vein was also interrogated. Spectral Doppler was utilized to evaluate flow at rest and with distal augmentation maneuvers in the common femoral, femoral and popliteal veins. COMPARISON:  None. FINDINGS: Contralateral Common Femoral Vein: Respiratory phasicity is normal and symmetric with the symptomatic side. No evidence of thrombus. Normal compressibility. Common Femoral Vein: No evidence of thrombus. Normal compressibility, respiratory phasicity and response to augmentation. Saphenofemoral Junction: No evidence of thrombus. Normal compressibility and flow on color Doppler imaging. Profunda Femoral Vein: No evidence of thrombus. Normal compressibility and flow on color Doppler imaging. Femoral Vein: No evidence of thrombus. Normal compressibility, respiratory phasicity and response to augmentation. Popliteal Vein: No evidence of thrombus. Normal compressibility, respiratory phasicity and response to augmentation. Calf Veins: No evidence of thrombus. Normal compressibility and flow on color Doppler imaging. Superficial Great Saphenous Vein: No evidence of thrombus. Normal compressibility. Venous Reflux:  None. Other Findings:  None. IMPRESSION: No evidence of deep venous thrombosis. Electronically Signed   By: Earle Gell M.D.   On: 08/06/2017 18:42   Dg Foot Complete Right  Result Date: 08/08/2017 CLINICAL DATA:  Progressive right foot pain, swelling, and tenderness. No known injury. EXAM: RIGHT FOOT COMPLETE - 3+ VIEW COMPARISON:  Radiographs dated 08/06/2017 FINDINGS: There is new soft tissue swelling over the first MTP joint and over the dorsum of the foot. No visible gas in the soft tissues. Chronic arthritic changes at the first MTP joint as well as in the IP joints of the toes, unchanged. Postsurgical changes in the distal first metatarsal. Chronic arthritic changes at the midfoot, stable. IMPRESSION: New soft tissue swelling of the dorsum  of the foot and adjacent to the first MTP joint. 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 No visible osteomyelitis or gas in the soft tissues. Electronically Signed   By: Lorriane Shire M.D.   On: 08/08/2017 11:35   Dg Foot Complete Right  Result Date: 08/06/2017 CLINICAL DATA:  72 y/o F; diabetic wound on the bottom of the right foot on plantar surface around first and second metatarsals. EXAM: RIGHT FOOT COMPLETE - 3+ VIEW COMPARISON:  None. FINDINGS: There is no evidence of fracture or dislocation. Chronic postsurgical changes of the distal first metatarsal. There is no evidence of arthropathy or other focal bone abnormality. IMPRESSION: No findings of osteomyelitis. Electronically Signed   By: Kristine Garbe M.D.   On: 08/06/2017 17:13    Procedures Procedures (including critical care time)  Medications Ordered in ED Medications  vancomycin (VANCOCIN) IVPB 750 mg/150 ml premix (has no administration in time range)  morphine 4 MG/ML injection 4 mg (4 mg Intravenous Given 08/08/17 1135)  vancomycin (VANCOCIN) IVPB 1000 mg/200 mL premix (1,000 mg Intravenous Transfusing/Transfer 08/08/17 1432)  sodium chloride 0.9 % bolus 1,000 mL (0 mLs Intravenous Stopped 08/08/17 1433)     Initial Impression / Assessment and Plan / ED Course  I have reviewed the triage vital signs and the nursing notes.  Pertinent labs & imaging results that were available during my care of the patient were reviewed by me and considered in my medical decision making (see chart for details).     72 year old female who presents for evaluation of foot pain.  Seen in the ED on 08/06/2017 for evaluation of same symptoms.  Work-up at that time was unremarkable.  Given concern for cellulitis, patient was given a dose of IV antibiotic's in the department.  She declined admission.  Was sent home on doxycycline which  she states she has been taking.  Return today for worsening pain, redness, swelling.   No fevers but has had episodes of vomiting. Patient is afebrile, non-toxic appearing, sitting comfortably on examination table. Vital signs reviewed and stable. Patient is neurovascularly intact.  Consider cellulitis versus abscess.  History/physical exam is not concerning for septic arthritis, acute arterial embolism.  Given recent DVT study that was negative, do not feel that a repeat imaging is indicated at this point.  Feel that this is much more an infectious process rather than a DVT.  Plan for basic labs, x-ray evaluation.  Will give analgesics, fluids  BMP shows slight hyperglycemia.  CBC shows white blood cell count 10.4.  Slight anemia noted.  Lactic acid is negative.  X-ray reviewed.  Negative for any acute subcutaneous emphysema that would be concerning for osteomyelitis.  Given that patient has been on p.o. antibiotics for 48 hours and has not had any improvement and is having worsening symptoms, feel that patient meets criteria for inpatient admission and IV antibiotics for cellulitis.  I discussed with patient and she is agreeable to plan.  Will consult hospitalist for admission.  Discussed patient with Dr. Cathlean Sauer (hospitalist). Agrees with plan for admission.   Final Clinical Impressions(s) / ED Diagnoses   Final diagnoses:  Cellulitis of right lower extremity    ED Discharge Orders    None       Volanda Napoleon, PA-C 08/08/17 1504    LongWonda Olds, MD 08/09/17 [phone]

## 2017-08-08 NOTE — ED Triage Notes (Signed)
Was seen x 2 days ago in ED for swelling and pain to right foot and placed on antibiotic's . Pain, redness and  swelling has got worsen

## 2017-08-08 NOTE — ED Notes (Signed)
Given lunch tray , ate 100%, up to BR, gait steady

## 2017-08-08 NOTE — ED Notes (Signed)
ED Provider at bedside. 

## 2017-08-08 NOTE — ED Notes (Signed)
Pt on auto VS  

## 2017-08-08 NOTE — ED Notes (Signed)
Report given to Amy RN, receiving nurse at [facility]. All valuable kept with, has cellphone, clothing, handbag. Informed that she is keeping her valuable at her own risk, verbalized understanding.

## 2017-08-08 NOTE — Progress Notes (Signed)
72 year old female with right foot cellulitis, treated with oral cephalexin as an outpatient for the last 2 days without clinical improvement.  Returns to [facility] with persistent symptoms.  Transferred to [facility] for IV antibiotic therapy and further testing.  Patient hemodynamically stable with no leukocytosis. Admit to medical-surgical unit.     [name]

## 2017-08-09 ENCOUNTER — Inpatient Hospital Stay (HOSPITAL_COMMUNITY): Payer: Medicare HMO

## 2017-08-09 LAB — BASIC METABOLIC PANEL
Anion gap: 10 (ref 5–15)
BUN: 11 mg/dL (ref 6–20)
CO2: 29 mmol/L (ref 22–32)
Calcium: 9.1 mg/dL (ref 8.9–10.3)
Chloride: 102 mmol/L (ref 101–111)
Creatinine, Ser: 0.6 mg/dL (ref 0.44–1.00)
GFR calc Af Amer: >60 mL/min (ref >60)
GFR calc non Af Amer: >60 mL/min (ref >60)
Glucose, Bld: 109 mg/dL — ABNORMAL HIGH (ref 65–99)
Potassium: 3.6 mmol/L (ref 3.5–5.1)
Sodium: 141 mmol/L (ref 135–145)

## 2017-08-09 LAB — CBC
HCT: 33.3 % — ABNORMAL LOW (ref 36.0–46.0)
HEMOGLOBIN: 10.6 g/dL — AB (ref 12.0–15.0)
MCH: 28 pg (ref 26.0–34.0)
MCHC: 31.8 g/dL (ref 30.0–36.0)
MCV: 87.9 fL (ref 78.0–100.0)
Platelets: 223 10*3/uL (ref 150–400)
RBC: 3.79 MIL/uL — ABNORMAL LOW (ref 3.87–5.11)
RDW: 12.9 % (ref 11.5–15.5)
WBC: 8.3 10*3/uL (ref 4.0–10.5)

## 2017-08-09 LAB — SURGICAL PCR SCREEN
MRSA, PCR: NEGATIVE
Staphylococcus aureus: NEGATIVE

## 2017-08-09 LAB — GLUCOSE, CAPILLARY
GLUCOSE-CAPILLARY: 144 mg/dL — AB (ref 65–99)
Glucose-Capillary: 100 mg/dL — ABNORMAL HIGH (ref 65–99)
Glucose-Capillary: 99 mg/dL (ref 65–99)
Glucose-Capillary: 98 mg/dL (ref 65–99)

## 2017-08-09 MED ORDER — ESCITALOPRAM OXALATE 10 MG PO TABS
10.0000 mg | ORAL_TABLET | Freq: Every day | ORAL | Status: DC
  Administered 2017-08-09 – 2017-08-12 (×4): 10 mg via ORAL
  Filled 2017-08-09 (×4): qty 1

## 2017-08-09 MED ORDER — GADOBENATE DIMEGLUMINE 529 MG/ML IV SOLN
20.0000 mL | Freq: Once | INTRAVENOUS | Status: AC | PRN
  Administered 2017-08-09: 18 mL via INTRAVENOUS

## 2017-08-09 NOTE — Progress Notes (Signed)
Report given to Roj, RN, for pt going to [id number]. [facility] notified for transport. Sylvanna Burggraf, [employer]

## 2017-08-09 NOTE — Consult Note (Signed)
ORTHOPAEDIC CONSULTATION  REQUESTING PHYSICIAN: Alma Friendly, MD  Chief Complaint: Right foot cellulitis and DFU  HPI: Brittany [name] is a 72 y.o. female who presents with right DFU and cellulitis since Tuesday with recent worsening.  Denies any constitutional sxs.  Patient has severe BLE peripheral neuropathy due to DM.  Ortho consulted for management.  Past Medical History:  Diagnosis Date  . Chronic pain    In pain clinic   . Diabetes mellitus   . GERD (gastroesophageal reflux disease)   . Hypertension   . Neuropathy    Past Surgical History:  Procedure Laterality Date  . LUMBAR FUSION     Social History   Socioeconomic History  . Marital status: Widowed    Spouse name: Not on file  . Number of children: Not on file  . Years of education: Not on file  . Highest education level: Not on file  Occupational History  . Not on file  Social Needs  . Financial resource strain: Not on file  . Food insecurity:    Worry: Not on file    Inability: Not on file  . Transportation needs:    Medical: Not on file    Non-medical: Not on file  Tobacco Use  . Smoking status: Never Smoker  . Smokeless tobacco: Never Used  Substance and Sexual Activity  . Alcohol use: No  . Drug use: No  . Sexual activity: Not Currently    Partners: Male  Lifestyle  . Physical activity:    Days per week: Not on file    Minutes per session: Not on file  . Stress: Not on file  Relationships  . Social connections:    Talks on phone: Not on file    Gets together: Not on file    Attends religious service: Not on file    Active member of club or organization: Not on file    Attends meetings of clubs or organizations: Not on file    Relationship status: Not on file  Other Topics Concern  . Not on file  Social History Narrative  . Not on file   Family History  Problem Relation Age of Onset  . Cancer Mother        breast  . Depression Mother        bipolar  . Breast cancer  Mother 17  . Heart disease Father 42       MI  . Diabetes Brother   . Kidney disease Brother   . Hypertension Brother   . Hyperlipidemia Brother   . Stroke Brother   . Coronary artery disease Unknown   . Diabetes Unknown    - negative except otherwise stated in the family history section Allergies  Allergen Reactions  . Sulfonamide Derivatives     REACTION: HIVES   Prior to Admission medications   Medication Sig Start Date End Date Taking? Authorizing Provider  amLODipine (NORVASC) 5 MG tablet TAKE 1 TABLET DAILY 06/24/17  Yes Roma Schanz R, DO  aspirin 81 MG tablet Take 81 mg by mouth at bedtime.    Yes [provider]  atorvastatin (LIPITOR) 10 MG tablet Take 1 tablet (10 mg total) by mouth daily. Patient taking differently: Take 10 mg by mouth every morning.  06/18/17  Yes Ann Held, DO  bisoprolol-hydrochlorothiazide [facility]) 10-6.25 MG tablet TAKE 1 TABLET DAILY 10/24/16  Yes Roma Schanz R, DO  calcium carbonate (TUMS EX) 750 MG chewable tablet Chew  1 tablet by mouth daily. For acid reflux    Yes [provider]  Cholecalciferol (VITAMIN D) 1000 UNITS capsule Take 1,000 Units by mouth daily.   Yes [provider]  desonide (DESOWEN) 0.05 % cream Apply 1 application topically as needed.   Yes [provider]  docusate sodium (COLACE) 100 MG capsule Take 100 mg by mouth 2 (two) times daily.   Yes [provider]  doxepin (SINEQUAN) 25 MG capsule Take 1 capsule by mouth as needed. 09/11/16  Yes [provider]  doxycycline (VIBRAMYCIN) 100 MG capsule Take 1 capsule (100 mg total) by mouth 2 (two) times daily. One po bid x 7 days 08/06/17  Yes Sherwood Gambler, MD  escitalopram (LEXAPRO) 10 MG tablet Take 1 tablet (10 mg total) by mouth daily. 06/13/17  Yes Roma Schanz R, DO  estradiol (ESTRACE) 1 MG tablet TAKE 1 TABLET DAILY 06/13/17  Yes Ann Held, DO  folic acid (FOLVITE) 213 MCG tablet Take  400 mcg by mouth daily.   Yes [provider]  furosemide (LASIX) 40 MG tablet Take 1 tablet (40 mg total) by mouth daily. 07/09/17  Yes Roma Schanz R, DO  gabapentin (NEURONTIN) 800 MG tablet Take 1 tablet (800 mg total) by mouth 4 (four) times daily. 05/04/15  Yes Roma Schanz R, DO  l-methylfolate-B6-B12 (METANX) 3-35-2 MG TABS Take 1 tablet by mouth daily.   Yes [provider]  morphine (MS CONTIN) 60 MG 12 hr tablet Take 60 mg by mouth 3 (three) times daily. 04/22/11  Yes Ann Held, DO  Multiple Vitamin (MULTIVITAMIN WITH MINERALS) TABS Take 1 tablet by mouth daily.   Yes [provider]  omega-3 acid ethyl esters (LOVAZA) 1 G capsule Take 1 g by mouth daily.   Yes [provider]  omeprazole (PRILOSEC) 40 MG capsule TAKE 1 CAPSULE DAILY 06/13/17  Yes Lowne Chase, Yvonne R, DO  OVER THE COUNTER MEDICATION Take 1 tablet by mouth daily. Hair, skin, and nails vitamin   Yes [provider]  potassium chloride SA (K-DUR,KLOR-CON) 20 MEQ tablet Take 1 tablet (20 mEq total) by mouth daily. 07/09/17  Yes Ann Held, DO  sitaGLIPtin-metformin (JANUMET) 50-1000 MG tablet Take 2 tablets po at bedtime daily 07/09/17  Yes Roma Schanz R, DO  tiZANidine (ZANAFLEX) 4 MG tablet Take 1 tablet by mouth 3 (three) times daily.  08/26/10  Yes [provider]  Blood Glucose Monitoring Suppl (ONE TOUCH ULTRA MINI) w/Device KIT Use as directed once a day.  Dx Code:  E11.42 05/01/17   Roma Schanz R, DO  glucose blood (ONE TOUCH ULTRA TEST) test strip Check Blood sugar once daily 02/07/17   Carollee Herter, Alferd Apa, DO  loratadine (CLARITIN) 10 MG tablet Take 10 mg by mouth daily as needed. For allergies    [provider]  [facility] DELICA LANCETS 08M MISC Check blood sugar once daily 02/07/17   Ann Held, DO   Dg Foot Complete Right  Result Date: 08/08/2017 CLINICAL DATA:  Progressive right foot pain,  swelling, and tenderness. No known injury. EXAM: RIGHT FOOT COMPLETE - 3+ VIEW COMPARISON:  Radiographs dated 08/06/2017 FINDINGS: There is new soft tissue swelling over the first MTP joint and over the dorsum of the foot. No visible gas in the soft tissues. Chronic arthritic changes at the first MTP joint as well as in the IP joints of the toes, unchanged. Postsurgical  changes in the distal first metatarsal. Chronic arthritic changes at the midfoot, stable. IMPRESSION: New soft tissue swelling of the dorsum of the foot and adjacent to the first MTP joint. 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 No visible osteomyelitis or gas in the soft tissues. Electronically Signed   By: Lorriane Shire M.D.   On: 08/08/2017 11:35   - pertinent xrays, CT, MRI studies were reviewed and independently interpreted  Positive ROS: All other systems have been reviewed and were otherwise negative with the exception of those mentioned in the HPI and as above.  Physical Exam: General: Alert, no acute distress Cardiovascular: No pedal edema Respiratory: No cyanosis, no use of accessory musculature GI: No organomegaly, abdomen is soft and non-tender Skin: No lesions in the area of chief complaint Neurologic: Sensation intact distally Psychiatric: Patient is competent for consent with normal mood and affect Lymphatic: No axillary or cervical lymphadenopathy  MUSCULOSKELETAL:  - severe swelling and cellulitis of the foot - small plantar pin sized open wound with drainage - foot wwp - insensate peripheral neuropathy  Assessment: Right DFU and cellulitis  Plan: - MRI to evaluate for abscess and suspected osteomyelitis - NPO after midnight for I&D right foot - transfer to [place] for surgery tomorrow  Thank you for the consult and the opportunity to see Brittany [name]. Eduard Roux, MD [place] 1:26 PM

## 2017-08-09 NOTE — Progress Notes (Signed)
Transferred from floor via [facility] for transport to [facility] for procedure by Dr Delane Ginger. Madilyn Fireman, [employer]

## 2017-08-09 NOTE — Plan of Care (Signed)

## 2017-08-09 NOTE — Progress Notes (Addendum)
PROGRESS NOTE  Brittany [name] QZR:[id number] DOB: February 03, 1946 DOA: 08/08/2017 PCP: Ann Held, DO  HPI/Recap of past 24 hours: Brittany [name] is a 72 y.o. female with medical history of DM type 2 with neuropathy, chronic pain who follows at the pain clinic, hypertension and GERD who presented to the ED on 5/1 with a right foot ulcer with drainage and cellulitis up above the knee for the past few days PTA. A venous duplex was negative for DVT she received Zosyn in the ED and was sent home PO AB. Symptoms continued to worsen and she presented to [place] center where she received a dose of vancomycin and was recommended to be admitted here at [facility] long.  Today, pt reported feeling better, cellulitis with improvement. Denies any new complaints, no fever/chills, diarrhea, N/V, chest pain, abdominal pain. Spoke to Dr Erlinda Hong from orthopedics, requesting pt be transferred to [facility] for I&D possibly 08/10/17.   Assessment/Plan: Principal Problem:   Abscess Active Problems:   Essential hypertension   Cellulitis   Hypoglycemia without diagnosis of diabetes mellitus   Peripheral neuropathy  Right foot abscess with associated RLE cellulitis Currently afebrile, no leukocytosis BC x2, NGTD, LA WNL X-ray of right foot showed: new soft tissue swelling of the dorsum of the foot and adjacent to the first MTP joint. No visible osteomyelitis or gas in the soft tissues Orthopedic consulted: Spoke to Dr Erlinda Hong from orthopedics, requesting pt be transferred to [facility] for I&D possibly 08/10/17  Continue empiric vancomycin and Zosyn Monitor closely  Anemia of chronic disease Hgb normal as of 03/2016, now between 10-11 Iron panel pending Daily CBC  Essential hypertension BP soft Continue amlodipine Hold bisoprolol, HCTZ, lasix (for pedal edema as per pt)  DM type 2 Well controlled A1c 6.2  Continue Januvia (replaced by Lady Gary), SSI, accu-checks Hold Glucophage  Peripheral neuropathy (due to  "back issues") Continue Neurontin, tizanidine, morphine Continue outpatient pain clinic   Code Status: Full  Family Communication: None at bedside  Disposition Plan: Transfer to [facility]   Consultants:  Orthopedics consulted  Procedures: None  Antimicrobials:  Vancomycin  Zosyn  DVT prophylaxis:  Lovenox   Objective: Vitals:   08/08/17 1415 08/08/17 1555 08/08/17 2036 08/09/17 0509  BP: (!) 105/47 90/63 (!) 119/55 122/62  Pulse: 66 66 65 (!) 58  Resp:  16 18 15   Temp:  99 F (37.2 C) 98.6 F (37 C) 98.3 F (36.8 C)  TempSrc:  Oral Oral Oral  SpO2: 94% 96% 97% 100%  Weight:      Height:        Intake/Output Summary (Last 24 hours) at 08/09/2017 1235 Last data filed at 08/09/2017 0930 Gross per 24 hour  Intake 1570 ml  Output -  Net 1570 ml   Filed Weights   08/08/17 1043  Weight: 78 kg (172 lb)    Exam:   General: NAD  Cardiovascular: S1, S2 present  Respiratory: CTAB  Abdomen: Soft, nontender, nondistended, bowel sounds present  Musculoskeletal: Right foot sole abscess with RLE erythema, swelling extending up to knee  Skin: Normal, otherwise as above  Psychiatry: Normal mood   Data Reviewed: CBC: Recent Labs  Lab 08/06/17 1626 08/08/17 1119 08/09/17 0524  WBC 9.9 10.4 8.3  NEUTROABS 7.4 8.1*  --   HGB 11.3* 11.2* 10.6*  HCT 34.3* 33.3* 33.3*  MCV 87.7 87.2 87.9  PLT 231 241 354   Basic Metabolic Panel: Recent Labs  Lab 08/06/17 1626 08/08/17 1119  08/09/17 0524  NA 135 138 141  K 3.3* 3.5 3.6  CL 98* 100* 102  CO2 27 28 29   GLUCOSE 200* 132* 109*  BUN 15 13 11   CREATININE 0.59 0.72 0.60  CALCIUM 8.7* 8.9 9.1   GFR: Estimated Creatinine Clearance: 69.4 mL/min (by C-G formula based on SCr of 0.6 mg/dL). Liver Function Tests: Recent Labs  Lab 08/06/17 1626  AST 18  ALT 11*  ALKPHOS 47  BILITOT 0.5  PROT 7.1  ALBUMIN 3.4*   No results for input(s): LIPASE, AMYLASE in the last 168 hours. No results for input(s):  AMMONIA in the last 168 hours. Coagulation Profile: No results for input(s): INR, PROTIME in the last 168 hours. Cardiac Enzymes: No results for input(s): CKTOTAL, CKMB, CKMBINDEX, TROPONINI in the last 168 hours. BNP (last 3 results) No results for input(s): PROBNP in the last 8760 hours. HbA1C: Recent Labs    08/08/17 1720  HGBA1C 6.2*   CBG: Recent Labs  Lab 08/08/17 1744 08/08/17 2037 08/09/17 0813  GLUCAP 135* 94 100*   Lipid Profile: No results for input(s): CHOL, HDL, LDLCALC, TRIG, CHOLHDL, LDLDIRECT in the last 72 hours. Thyroid Function Tests: No results for input(s): TSH, T4TOTAL, FREET4, T3FREE, THYROIDAB in the last 72 hours. Anemia Panel: No results for input(s): VITAMINB12, FOLATE, FERRITIN, TIBC, IRON, RETICCTPCT in the last 72 hours. Urine analysis:    Component Value Date/Time   BILIRUBINUR neg 03/11/2016 1030   PROTEINUR neg 03/11/2016 1030   UROBILINOGEN 0.2 03/11/2016 1030   NITRITE neg 03/11/2016 1030   LEUKOCYTESUR large (3+) (A) 03/11/2016 1030   Sepsis Labs: @LABRCNTIP (procalcitonin:4,lacticidven:4)  ) Recent Results (from the past 240 hour(s))  Blood Culture (routine x 2)     Status: None (Preliminary result)   Collection Time: 08/06/17  4:12 PM  Result Value Ref Range Status   Specimen Description   Final    BLOOD RIGHT ANTECUBITAL Performed at [facility], [address] [place], [place] [address]    Special Requests   Final    BOTTLES DRAWN AEROBIC AND ANAEROBIC Blood Culture adequate volume Performed at [facility], [address] [place], [place] [address]    Culture   Final    NO GROWTH 3 DAYS Performed at [facility], [place] [address] [place], [place] [address]    Report Status PENDING  Incomplete  Blood Culture (routine x 2)     Status: None (Preliminary result)   Collection Time: 08/06/17  4:23 PM  Result Value Ref Range Status   Specimen Description   Final    BLOOD BLOOD LEFT  HAND Performed at [facility], [address] [place], [place] [address]    Special Requests   Final    BOTTLES DRAWN AEROBIC AND ANAEROBIC Blood Culture adequate volume Performed at [facility], [address] [place], [place] [address]    Culture   Final    NO GROWTH 3 DAYS Performed at [facility], [place] [address], [place], [place] [address]    Report Status PENDING  Incomplete      Studies: No results found.  Scheduled Meds: . amLODipine  5 mg Oral Daily  . aspirin  81 mg Oral QHS  . atorvastatin  10 mg Oral Daily  . bisoprolol-hydrochlorothiazide  1 tablet Oral Daily  . docusate sodium  100 mg Oral BID  . enoxaparin (LOVENOX) injection  40 mg Subcutaneous Q24H  .  escitalopram  10 mg Oral Daily  . estradiol  1 mg Oral Daily  . folic acid  0.5 mg Oral Daily  . gabapentin  800 mg Oral QID  . insulin aspart  0-15 Units Subcutaneous TID WC  . insulin aspart  0-5 Units Subcutaneous QHS  . linagliptin  5 mg Oral Daily  . morphine  60 mg Oral Q8H  . multivitamin with minerals  1 tablet Oral Daily  . pantoprazole  40 mg Oral Daily  . tiZANidine  4 mg Oral TID    Continuous Infusions: . piperacillin-tazobactam (ZOSYN)  IV 3.375 g (08/09/17 0527)  . vancomycin Stopped (08/09/17 0230)     LOS: 1 day     Alma Friendly, MD [facility]  If 7PM-7AM, please contact night-coverage [email or web address] Password [facility] 08/09/2017, 12:35 PM

## 2017-08-09 NOTE — Progress Notes (Signed)
Paged Dr Horris Latino for unit preference at [facility]; she agreed to [address]. Dinesh Ulysse, [employer]

## 2017-08-10 ENCOUNTER — Inpatient Hospital Stay (HOSPITAL_COMMUNITY): Payer: Medicare HMO | Admitting: Anesthesiology

## 2017-08-10 ENCOUNTER — Encounter (HOSPITAL_COMMUNITY): Payer: Self-pay | Admitting: Anesthesiology

## 2017-08-10 ENCOUNTER — Encounter (HOSPITAL_COMMUNITY)
Admission: EM | Disposition: A | Discharge status: Home Health | Payer: Self-pay | Source: Home / Self Care | Attending: Internal Medicine

## 2017-08-10 DIAGNOSIS — L089 Local infection of the skin and subcutaneous tissue, unspecified: Secondary | ICD-10-CM

## 2017-08-10 HISTORY — PX: I & D EXTREMITY: SHX5045

## 2017-08-10 LAB — GLUCOSE, CAPILLARY
GLUCOSE-CAPILLARY: 95 mg/dL (ref 65–99)
GLUCOSE-CAPILLARY: 290 mg/dL — AB (ref 65–99)
Glucose-Capillary: 86 mg/dL (ref 65–99)
Glucose-Capillary: 213 mg/dL — ABNORMAL HIGH (ref 65–99)
Glucose-Capillary: 190 mg/dL — ABNORMAL HIGH (ref 65–99)

## 2017-08-10 SURGERY — IRRIGATION AND DEBRIDEMENT EXTREMITY
Anesthesia: General | Site: Foot | Laterality: Right

## 2017-08-10 MED ORDER — MIDAZOLAM HCL 2 MG/2ML IJ SOLN
INTRAMUSCULAR | Status: AC
  Filled 2017-08-10: qty 2

## 2017-08-10 MED ORDER — PROPOFOL 10 MG/ML IV BOLUS
INTRAVENOUS | Status: DC | PRN
  Administered 2017-08-10: 150 mg via INTRAVENOUS

## 2017-08-10 MED ORDER — DEXAMETHASONE SODIUM PHOSPHATE 10 MG/ML IJ SOLN
INTRAMUSCULAR | Status: DC | PRN
  Administered 2017-08-10: 5 mg via INTRAVENOUS

## 2017-08-10 MED ORDER — LACTATED RINGERS IV SOLN: INTRAVENOUS | Status: DC

## 2017-08-10 MED ORDER — MIDAZOLAM HCL 5 MG/5ML IJ SOLN
INTRAMUSCULAR | Status: DC | PRN
  Administered 2017-08-10: 2 mg via INTRAVENOUS

## 2017-08-10 MED ORDER — LACTATED RINGERS IV SOLN
INTRAVENOUS | Status: DC | PRN
  Administered 2017-08-10 (×2): via INTRAVENOUS

## 2017-08-10 MED ORDER — HYDROMORPHONE HCL 2 MG/ML IJ SOLN: 0.3000 mg | INTRAMUSCULAR | Status: DC | PRN

## 2017-08-10 MED ORDER — DEXAMETHASONE SODIUM PHOSPHATE 10 MG/ML IJ SOLN
INTRAMUSCULAR | Status: AC
  Filled 2017-08-10: qty 1

## 2017-08-10 MED ORDER — PHENYLEPHRINE HCL 10 MG/ML IJ SOLN
INTRAVENOUS | Status: DC | PRN
  Administered 2017-08-10: 40 ug/min via INTRAVENOUS

## 2017-08-10 MED ORDER — SODIUM CHLORIDE 0.9 % IR SOLN
Status: DC | PRN
  Administered 2017-08-10: 6000 mL

## 2017-08-10 MED ORDER — ONDANSETRON HCL 4 MG/2ML IJ SOLN
INTRAMUSCULAR | Status: AC
  Filled 2017-08-10: qty 2

## 2017-08-10 MED ORDER — PROMETHAZINE HCL 25 MG/ML IJ SOLN: 6.2500 mg | INTRAMUSCULAR | Status: DC | PRN

## 2017-08-10 MED ORDER — MEPERIDINE HCL 50 MG/ML IJ SOLN: 6.2500 mg | INTRAMUSCULAR | Status: DC | PRN

## 2017-08-10 MED ORDER — SCOPOLAMINE 1 MG/3DAYS TD PT72
MEDICATED_PATCH | TRANSDERMAL | Status: DC | PRN
  Administered 2017-08-10: 1.5 mg via TRANSDERMAL

## 2017-08-10 MED ORDER — FENTANYL CITRATE (PF) 250 MCG/5ML IJ SOLN
INTRAMUSCULAR | Status: AC
  Filled 2017-08-10: qty 5

## 2017-08-10 MED ORDER — PROPOFOL 10 MG/ML IV BOLUS
INTRAVENOUS | Status: AC
  Filled 2017-08-10: qty 20

## 2017-08-10 MED ORDER — FENTANYL CITRATE (PF) 100 MCG/2ML IJ SOLN
INTRAMUSCULAR | Status: DC | PRN
  Administered 2017-08-10: 50 ug via INTRAVENOUS

## 2017-08-10 MED ORDER — LIDOCAINE HCL (CARDIAC) PF 100 MG/5ML IV SOSY
PREFILLED_SYRINGE | INTRAVENOUS | Status: DC | PRN
  Administered 2017-08-10: 40 mg via INTRAVENOUS

## 2017-08-10 MED ORDER — LIDOCAINE 2% (20 MG/ML) 5 ML SYRINGE
INTRAMUSCULAR | Status: AC
  Filled 2017-08-10: qty 5

## 2017-08-10 SURGICAL SUPPLY — 51 items
BANDAGE ACE 3X5.8 VEL STRL LF (GAUZE/BANDAGES/DRESSINGS) IMPLANT
BNDG COHESIVE 1X5 TAN STRL LF (GAUZE/BANDAGES/DRESSINGS) IMPLANT
BNDG COHESIVE 4X5 TAN STRL (GAUZE/BANDAGES/DRESSINGS) ×2 IMPLANT
BNDG COHESIVE 6X5 TAN STRL LF (GAUZE/BANDAGES/DRESSINGS) ×2 IMPLANT
BNDG CONFORM 3 STRL LF (GAUZE/BANDAGES/DRESSINGS) IMPLANT
BNDG GAUZE ELAST 4 BULKY (GAUZE/BANDAGES/DRESSINGS) ×1 IMPLANT
BNDG GAUZE STRTCH 6 (GAUZE/BANDAGES/DRESSINGS) ×1 IMPLANT
CORDS BIPOLAR (ELECTRODE) IMPLANT
COVER SURGICAL LIGHT HANDLE (MISCELLANEOUS) ×2 IMPLANT
CUFF TOURNIQUET SINGLE 24IN (TOURNIQUET CUFF) IMPLANT
CUFF TOURNIQUET SINGLE 34IN LL (TOURNIQUET CUFF) IMPLANT
CUFF TOURNIQUET SINGLE 44IN (TOURNIQUET CUFF) IMPLANT
DRAPE INCISE IOBAN 66X45 STRL (DRAPES) ×1 IMPLANT
DRAPE U-SHAPE 47X51 STRL (DRAPES) ×2 IMPLANT
DURAPREP 26ML APPLICATOR (WOUND CARE) ×1 IMPLANT
ELECT REM PT RETURN 9FT ADLT (ELECTROSURGICAL)
ELECTRODE REM PT RTRN 9FT ADLT (ELECTROSURGICAL) IMPLANT
GAUZE SPONGE 4X4 12PLY STRL (GAUZE/BANDAGES/DRESSINGS) ×4 IMPLANT
GAUZE SPONGE 4X4 12PLY STRL LF (GAUZE/BANDAGES/DRESSINGS) ×1 IMPLANT
GAUZE XEROFORM 5X9 LF (GAUZE/BANDAGES/DRESSINGS) ×2 IMPLANT
GLOVE BIOGEL PI IND STRL 7.0 (GLOVE) ×1 IMPLANT
GLOVE BIOGEL PI INDICATOR 7.0 (GLOVE) ×1
GLOVE ECLIPSE 7.0 STRL STRAW (GLOVE) ×3 IMPLANT
GLOVE SKINSENSE NS SZ7.5 (GLOVE) ×2
GLOVE SKINSENSE STRL SZ7.5 (GLOVE) ×2 IMPLANT
GOWN STRL REIN XL XLG (GOWN DISPOSABLE) ×3 IMPLANT
HANDPIECE INTERPULSE COAX TIP (DISPOSABLE) ×2
KIT BASIN OR (CUSTOM PROCEDURE TRAY) ×2 IMPLANT
KIT TURNOVER KIT B (KITS) ×2 IMPLANT
MANIFOLD NEPTUNE II (INSTRUMENTS) ×2 IMPLANT
PACK ORTHO EXTREMITY (CUSTOM PROCEDURE TRAY) ×2 IMPLANT
PAD ABD 8X10 STRL (GAUZE/BANDAGES/DRESSINGS) ×2 IMPLANT
PAD ARMBOARD 7.5X6 YLW CONV (MISCELLANEOUS) ×4 IMPLANT
PADDING CAST ABS 4INX4YD NS (CAST SUPPLIES)
PADDING CAST ABS COTTON 4X4 ST (CAST SUPPLIES) ×1 IMPLANT
PADDING CAST COTTON 6X4 STRL (CAST SUPPLIES) ×1 IMPLANT
SET HNDPC FAN SPRY TIP SCT (DISPOSABLE) IMPLANT
SPONGE LAP 18X18 X RAY DECT (DISPOSABLE) ×2 IMPLANT
STOCKINETTE IMPERVIOUS 9X36 MD (GAUZE/BANDAGES/DRESSINGS) ×1 IMPLANT
SUT ETHILON 2 0 FS 18 (SUTURE) ×3 IMPLANT
SUT ETHILON 2 0 PSLX (SUTURE) ×1 IMPLANT
SUT VIC AB 2-0 FS1 27 (SUTURE) ×2 IMPLANT
SWAB CULTURE ESWAB REG 1ML (MISCELLANEOUS) ×1 IMPLANT
TOWEL OR 17X24 6PK STRL BLUE (TOWEL DISPOSABLE) ×1 IMPLANT
TOWEL OR 17X26 10 PK STRL BLUE (TOWEL DISPOSABLE) ×2 IMPLANT
TUBE CONNECTING 12X1/4 (SUCTIONS) ×2 IMPLANT
TUBE FEEDING ENTERAL 5FR 16IN (TUBING) IMPLANT
TUBING CYSTO DISP (UROLOGICAL SUPPLIES) ×1 IMPLANT
UNDERPAD 30X30 (UNDERPADS AND DIAPERS) ×4 IMPLANT
WATER STERILE IRR 1000ML POUR (IV SOLUTION) ×2 IMPLANT
YANKAUER SUCT BULB TIP NO VENT (SUCTIONS) ×2 IMPLANT

## 2017-08-10 NOTE — Anesthesia Postprocedure Evaluation (Signed)
Anesthesia Post Note  Patient: TIMMIA COGBURN  Procedure(s) Performed: IRRIGATION AND DEBRIDEMENT FOOT (Right Foot)     Patient location during evaluation: PACU Anesthesia Type: General Level of consciousness: awake and alert Pain management: pain level controlled Vital Signs Assessment: post-procedure vital signs reviewed and stable Respiratory status: spontaneous breathing, nonlabored ventilation, respiratory function stable and patient connected to nasal cannula oxygen Cardiovascular status: blood pressure returned to baseline and stable Postop Assessment: no apparent nausea or vomiting Anesthetic complications: no    Last Vitals:  Vitals:   08/10/17 0937 08/10/17 1248  BP: 120/61 102/88  Pulse: (!) 55 60  Resp: 16 (!) 22  Temp: (!) 36.4 C 36.6 C  SpO2: 95% 96%    Last Pain:  Vitals:   08/10/17 1248  TempSrc: Oral  PainSc:                  Effie Berkshire

## 2017-08-10 NOTE — Anesthesia Procedure Notes (Signed)
Procedure Name: LMA Insertion Date/Time: 08/10/2017 7:43 AM Performed by: Izora Gala, CRNA Pre-anesthesia Checklist: Patient identified, Emergency Drugs available, Suction available and Patient being monitored Patient Re-evaluated:Patient Re-evaluated prior to induction Oxygen Delivery Method: Circle system utilized Preoxygenation: Pre-oxygenation with 100% oxygen Induction Type: IV induction Ventilation: Mask ventilation without difficulty Placement Confirmation: positive ETCO2 Tube secured with: Tape Dental Injury: Teeth and Oropharynx as per pre-operative assessment

## 2017-08-10 NOTE — Transfer of Care (Signed)
Immediate Anesthesia Transfer of Care Note  Patient: Brittany [name]  Procedure(s) Performed: IRRIGATION AND DEBRIDEMENT FOOT (Right Foot)  Patient Location: PACU  Anesthesia Type:General  Level of Consciousness: awake, alert , oriented and drowsy  Airway & Oxygen Therapy: Patient Spontanous Breathing  Post-op Assessment: Report given to RN, Post -op Vital signs reviewed and stable and Patient moving all extremities  Post vital signs: Reviewed and stable  Last Vitals:  Vitals Value Taken Time  BP    Temp    Pulse    Resp    SpO2      Last Pain:  Vitals:   08/10/17 0612  TempSrc:   PainSc: Asleep      Patients Stated Pain Goal: 3 (71/06/26 9485)  Complications: No apparent anesthesia complications

## 2017-08-10 NOTE — Plan of Care (Signed)
  Problem: Nutrition: Goal: Adequate nutrition will be maintained Outcome: Progressing   Problem: Elimination: Goal: Will not experience complications related to bowel motility Outcome: Progressing   Problem: Pain Managment: Goal: General experience of comfort will improve Outcome: Progressing   Problem: Safety: Goal: Ability to remain free from injury will improve Outcome: Progressing   

## 2017-08-10 NOTE — Progress Notes (Signed)
Orthopedic Tech Progress Note Patient Details:  Brittany [name] 05-23-1945 [id number]  Ortho Devices Type of Ortho Device: Darco shoe   Post Interventions Patient Tolerated: Well Instructions Provided: Care of device   [place] Pink 08/10/2017, 12:10 PM

## 2017-08-10 NOTE — Op Note (Signed)
   Date of Surgery: 08/10/2017  INDICATIONS: Brittany [name] is a 72 y.o.-year-old female with a right diabetic foot ulcer;  The patient did consent to the procedure after discussion of the risks and benefits.  PREOPERATIVE DIAGNOSIS: Right diabetic foot ulcers  POSTOPERATIVE DIAGNOSIS: Same.  PROCEDURE:  1. Sharp excisional debridement of right foot skin, subcutaneous tissue, tendon 56 cm  SURGEON: N. Eduard Roux, M.D.  ASSIST: Ciro Backer [place], [place]; necessary for the timely completion of procedure and due to complexity of procedure.  ANESTHESIA:  general  IV FLUIDS AND URINE: See anesthesia.  ESTIMATED BLOOD LOSS: Minimal mL.  IMPLANTS: None  DRAINS: None  COMPLICATIONS: None.  DESCRIPTION OF PROCEDURE: The patient was brought to the operating room and placed supine on the operating table.  The patient had been signed prior to the procedure and this was documented. The patient had the anesthesia placed by the anesthesiologist.  A time-out was performed to confirm that this was the correct patient, site, side and location. The patient did receive antibiotics prior to the incision and was re-dosed during the procedure as needed at indicated intervals.  The patient had the operative extremity prepped and draped in the standard surgical fashion.    Sharp excisional debridement of the diabetic foot ulcer was performed using a rongeur and knife which included the skin, subcutaneous tissue, FHL and FHB tendons.  This was debrided back to healthy bleeding border.  The wounds were both cultured.  There were 2 ulcers total with one on the plantar aspect of the first metatarsal head and these other on the medial aspect of the first metatarsal head.  There is no evidence of deeper penetration down to the bone.  There was no undermining or tunneling.  After thorough debridement this was then irrigated with 6 L of normal saline using pulse lavage.  Hemostasis was then obtained.  We then packed the  wounds with wet-to-dry dressings and a sterile dressing was then placed on top of that.  Patient tolerated the procedure well and had no immediate complications.  POSTOPERATIVE PLAN: Patient will need to undergo serial wet-to-dry dressings until the wound is healed.  We will allow the wound to heal through secondary intention.  [name], MD [place] 8:21 AM

## 2017-08-10 NOTE — Anesthesia Preprocedure Evaluation (Addendum)
Anesthesia Evaluation  Patient identified by MRN, date of birth, ID band Patient awake    Reviewed: Allergy & Precautions, NPO status , Patient's Chart, lab work & pertinent test results  Airway Mallampati: I  TM Distance: >3 FB Neck ROM: Full    Dental  (+) Missing, Poor Dentition,    Pulmonary neg pulmonary ROS,    breath sounds clear to auscultation       Cardiovascular hypertension, Pt. on medications and Pt. on home beta blockers  Rhythm:Regular Rate:Normal     Neuro/Psych  Neuromuscular disease negative psych ROS   GI/Hepatic Neg liver ROS, GERD  Medicated,  Endo/Other  diabetes, Type 2, Oral Hypoglycemic Agents  Renal/GU      Musculoskeletal negative musculoskeletal ROS (+)   Abdominal Normal abdominal exam  (+)   Peds  Hematology negative hematology ROS (+)   Anesthesia Other Findings   Reproductive/Obstetrics                            Lab Results  Component Value Date   WBC 8.3 08/09/2017   HGB 10.6 (L) 08/09/2017   HCT 33.3 (L) 08/09/2017   MCV 87.9 08/09/2017   PLT 223 08/09/2017   Lab Results  Component Value Date   CREATININE 0.60 08/09/2017   BUN 11 08/09/2017   NA 141 08/09/2017   K 3.6 08/09/2017   CL 102 08/09/2017   CO2 29 08/09/2017   No results found for: INR, PROTIME  EKG: sinus bradycardia.   Anesthesia Physical Anesthesia Plan  ASA: II  Anesthesia Plan: General   Post-op Pain Management:    Induction: Intravenous  PONV Risk Score and Plan: 4 or greater and Ondansetron, Dexamethasone, Midazolam and Scopolamine patch - Pre-op  Airway Management Planned: LMA  Additional Equipment: None  Intra-op Plan:   Post-operative Plan: Extubation in OR  Informed Consent: I have reviewed the patients History and Physical, chart, labs and discussed the procedure including the risks, benefits and alternatives for the proposed anesthesia with the patient  or authorized representative who has indicated his/her understanding and acceptance.   Dental advisory given  Plan Discussed with: CRNA  Anesthesia Plan Comments:        Anesthesia Quick Evaluation

## 2017-08-10 NOTE — Progress Notes (Signed)
PROGRESS NOTE  Brittany [name] XNA:[id number] DOB: 03/15/1946 DOA: 08/08/2017 PCP: Ann Held, DO  HPI/Recap of past 24 hours: Brittany [name] is a 72 y.o. female with medical history of DM type 2 with neuropathy, chronic pain who follows at the pain clinic, hypertension and GERD who presented to the ED on 5/1 with a right foot ulcer with drainage and cellulitis up above the knee for the past few days PTA. A venous duplex was negative for DVT she received Zosyn in the ED and was sent home PO AB. Symptoms continued to worsen and she presented to [place] center where she received a dose of vancomycin and was recommended to be admitted here at [facility] long.  Pt transferred to [facility] for further management per ortho, Dr Erlinda Hong, for [facility] excisional debridement of right foot skin, 08/10/2017     Assessment/Plan: Principal Problem:   Abscess Active Problems:   Essential hypertension   Cellulitis   Hypoglycemia without diagnosis of diabetes mellitus   Peripheral neuropathy  Right foot abscess with associated RLE cellulitis Currently afebrile, no leukocytosis BC x2, NGTD, LA WNL X-ray of right foot/MRI - negative for osteomyelitis or gas in the soft tissues Continue empiric vancomycin and Zosyn Monitor closely  Anemia of chronic disease Hgb normal as of 03/2016, now between 10-11 Iron panel pending Daily CBC  Essential hypertension BP soft Continue amlodipine Hold bisoprolol, HCTZ, lasix (for pedal edema as per pt)  DM type 2 Well controlled A1c 6.2  Continue Januvia (replaced by Lady Gary), SSI, accu-checks Hold Glucophage  Peripheral neuropathy (due to "back issues") Continue Neurontin, tizanidine, morphine Continue outpatient pain clinic   Code Status: Full  Family Communication: None at bedside  Disposition Plan: Transfer to [facility]   Consultants:  Orthopedics consulted  Procedures: None  Antimicrobials:  Vancomycin  Zosyn  DVT  prophylaxis:  Lovenox   Objective: Vitals:   08/10/17 0905 08/10/17 0910 08/10/17 0937 08/10/17 1248  BP: (!) 110/55  120/61 102/88  Pulse: (!) 55  (!) 55 60  Resp: 16  16 (!) 22  Temp:  97.8 F (36.6 C) (!) 97.5 F (36.4 C) 97.8 F (36.6 C)  TempSrc:   Oral Oral  SpO2: 99%  95% 96%  Weight:      Height:        Intake/Output Summary (Last 24 hours) at 08/10/2017 1410 Last data filed at 08/10/2017 1320 Gross per 24 hour  Intake 1580 ml  Output 1010 ml  Net 570 ml   Filed Weights   08/08/17 1043  Weight: 78 kg (172 lb)    Exam:   General: NAD  Cardiovascular: S1, S2 present  Respiratory: CTAB  Abdomen: Soft, nontender, nondistended, bowel sounds present  Musculoskeletal: Right foot postop dressing change noted, good cap refill of exposed toes  Skin: Normal, otherwise as above  Psychiatry: Normal mood   Data Reviewed: CBC: Recent Labs  Lab 08/06/17 1626 08/08/17 1119 08/09/17 0524  WBC 9.9 10.4 8.3  NEUTROABS 7.4 8.1*  --   HGB 11.3* 11.2* 10.6*  HCT 34.3* 33.3* 33.3*  MCV 87.7 87.2 87.9  PLT 231 241 542   Basic Metabolic Panel: Recent Labs  Lab 08/06/17 1626 08/08/17 1119 08/09/17 0524  NA 135 138 141  K 3.3* 3.5 3.6  CL 98* 100* 102  CO2 27 28 29   GLUCOSE 200* 132* 109*  BUN 15 13 11   CREATININE 0.59 0.72 0.60  CALCIUM 8.7* 8.9 9.1   GFR: Estimated Creatinine  Clearance: 69.4 mL/min (by C-G formula based on SCr of 0.6 mg/dL). Liver Function Tests: Recent Labs  Lab 08/06/17 1626  AST 18  ALT 11*  ALKPHOS 47  BILITOT 0.5  PROT 7.1  ALBUMIN 3.4*   No results for input(s): LIPASE, AMYLASE in the last 168 hours. No results for input(s): AMMONIA in the last 168 hours. Coagulation Profile: No results for input(s): INR, PROTIME in the last 168 hours. Cardiac Enzymes: No results for input(s): CKTOTAL, CKMB, CKMBINDEX, TROPONINI in the last 168 hours. BNP (last 3 results) No results for input(s): PROBNP in the last 8760  hours. HbA1C: Recent Labs    08/08/17 1720  HGBA1C 6.2*   CBG: Recent Labs  Lab 08/09/17 1838 08/09/17 2127 08/10/17 0632 08/10/17 0838 08/10/17 1245  GLUCAP 98 144* 86 95 290*   Lipid Profile: No results for input(s): CHOL, HDL, LDLCALC, TRIG, CHOLHDL, LDLDIRECT in the last 72 hours. Thyroid Function Tests: No results for input(s): TSH, T4TOTAL, FREET4, T3FREE, THYROIDAB in the last 72 hours. Anemia Panel: No results for input(s): VITAMINB12, FOLATE, FERRITIN, TIBC, IRON, RETICCTPCT in the last 72 hours. Urine analysis:    Component Value Date/Time   BILIRUBINUR neg 03/11/2016 1030   PROTEINUR neg 03/11/2016 1030   UROBILINOGEN 0.2 03/11/2016 1030   NITRITE neg 03/11/2016 1030   LEUKOCYTESUR large (3+) (A) 03/11/2016 1030   Sepsis Labs: @LABRCNTIP (procalcitonin:4,lacticidven:4)  ) Recent Results (from the past 240 hour(s))  Blood Culture (routine x 2)     Status: None (Preliminary result)   Collection Time: 08/06/17  4:12 PM  Result Value Ref Range Status   Specimen Description   Final    BLOOD RIGHT ANTECUBITAL Performed at [facility], [address] [place], [place] [address]    Special Requests   Final    BOTTLES DRAWN AEROBIC AND ANAEROBIC Blood Culture adequate volume Performed at [facility], [address] [place], [place] [address]    Culture   Final    NO GROWTH 4 DAYS Performed at [facility], [place] [address], [place], [place] [address]    Report Status PENDING  Incomplete  Blood Culture (routine x 2)     Status: None (Preliminary result)   Collection Time: 08/06/17  4:23 PM  Result Value Ref Range Status   Specimen Description   Final    BLOOD BLOOD LEFT HAND Performed at [facility], [address] [place], [place] [address]    Special Requests   Final    BOTTLES DRAWN AEROBIC AND ANAEROBIC Blood Culture adequate volume Performed at [facility], [address] [place], [place]  [address]    Culture   Final    NO GROWTH 4 DAYS Performed at [facility], [place] [address] [place], [place] [address]    Report Status PENDING  Incomplete  Surgical pcr screen     Status: None   Collection Time: 08/09/17  6:54 PM  Result Value Ref Range Status   MRSA, PCR NEGATIVE NEGATIVE Final   Staphylococcus aureus NEGATIVE NEGATIVE Final    Comment: (NOTE) The Xpert SA Assay (FDA approved for NASAL specimens in patients 19 years of age and older), is one component of a comprehensive surveillance program. It is not intended to diagnose infection nor to guide or monitor treatment. Performed at [facility], [place] [address], [place], [place] [address]       Studies: No results  found.  Scheduled Meds: . amLODipine  5 mg Oral Daily  . aspirin  81 mg Oral QHS  . atorvastatin  10 mg Oral Daily  . docusate sodium  100 mg Oral BID  . enoxaparin (LOVENOX) injection  40 mg Subcutaneous Q24H  . escitalopram  10 mg Oral Daily  . estradiol  1 mg Oral Daily  . folic acid  0.5 mg Oral Daily  . gabapentin  800 mg Oral QID  . insulin aspart  0-15 Units Subcutaneous TID WC  . insulin aspart  0-5 Units Subcutaneous QHS  . linagliptin  5 mg Oral Daily  . morphine  60 mg Oral Q8H  . multivitamin with minerals  1 tablet Oral Daily  . pantoprazole  40 mg Oral Daily  . tiZANidine  4 mg Oral TID    Continuous Infusions: . piperacillin-tazobactam (ZOSYN)  IV 3.375 g (08/10/17 1314)  . vancomycin 750 mg (08/10/17 1322)     LOS: 2 days     Benito Mccreedy, MD [phone] [facility]  If 7PM-7AM, please contact night-coverage [email or web address] Password [email or web address] 08/10/2017, 2:10 PM

## 2017-08-10 NOTE — H&P (Signed)

## 2017-08-11 ENCOUNTER — Ambulatory Visit: Payer: Medicare HMO | Admitting: Family Medicine

## 2017-08-11 ENCOUNTER — Encounter (HOSPITAL_COMMUNITY): Payer: Self-pay | Admitting: Orthopaedic Surgery

## 2017-08-11 LAB — CBC WITH DIFFERENTIAL/PLATELET
BASOS ABS: 0 10*3/uL (ref 0.0–0.1)
Basophils Relative: 0 %
Eosinophils Absolute: 0.1 10*3/uL (ref 0.0–0.7)
Eosinophils Relative: 1 %
HCT: 31.6 % — ABNORMAL LOW (ref 36.0–46.0)
Hemoglobin: 10.4 g/dL — ABNORMAL LOW (ref 12.0–15.0)
LYMPHS ABS: 2.2 10*3/uL (ref 0.7–4.0)
LYMPHS PCT: 21 %
MCH: 28.7 pg (ref 26.0–34.0)
MCHC: 32.9 g/dL (ref 30.0–36.0)
MCV: 87.3 fL (ref 78.0–100.0)
MONO ABS: 1 10*3/uL (ref 0.1–1.0)
Monocytes Relative: 9 %
Neutro Abs: 7.3 10*3/uL (ref 1.7–7.7)
Neutrophils Relative %: 69 %
PLATELETS: 232 10*3/uL (ref 150–400)
RBC: 3.62 MIL/uL — ABNORMAL LOW (ref 3.87–5.11)
RDW: 12.9 % (ref 11.5–15.5)
WBC: 10.7 10*3/uL — ABNORMAL HIGH (ref 4.0–10.5)

## 2017-08-11 LAB — CULTURE, BLOOD (ROUTINE X 2)
CULTURE: NO GROWTH
CULTURE: NO GROWTH
Special Requests: ADEQUATE
Special Requests: ADEQUATE

## 2017-08-11 LAB — BASIC METABOLIC PANEL
Anion gap: 8 (ref 5–15)
BUN: 12 mg/dL (ref 6–20)
CALCIUM: 8.7 mg/dL — AB (ref 8.9–10.3)
CO2: 31 mmol/L (ref 22–32)
Chloride: 100 mmol/L — ABNORMAL LOW (ref 101–111)
Creatinine, Ser: 0.81 mg/dL (ref 0.44–1.00)
GFR calc Af Amer: >60 mL/min (ref >60)
GLUCOSE: 152 mg/dL — AB (ref 65–99)
Potassium: 3.8 mmol/L (ref 3.5–5.1)
Sodium: 139 mmol/L (ref 135–145)

## 2017-08-11 LAB — GLUCOSE, CAPILLARY
Glucose-Capillary: 155 mg/dL — ABNORMAL HIGH (ref 65–99)
Glucose-Capillary: 144 mg/dL — ABNORMAL HIGH (ref 65–99)
Glucose-Capillary: 84 mg/dL (ref 65–99)
Glucose-Capillary: 114 mg/dL — ABNORMAL HIGH (ref 65–99)

## 2017-08-11 NOTE — Progress Notes (Signed)
Subjective: 1 Day Post-Op Procedure(s) (LRB): IRRIGATION AND DEBRIDEMENT FOOT (Right) Patient reports pain as mild.  Doing well this am  Objective: Vital signs in last 24 hours: Temp:  [97.5 F (36.4 C)-97.9 F (36.6 C)] 97.9 F (36.6 C) (05/06 0410) Pulse Rate:  [46-60] 46 (05/06 0410) Resp:  [11-22] 22 (05/05 1248) BP: (102-133)/(53-88) 123/61 (05/06 0410) SpO2:  [92 %-99 %] 95 % (05/06 0410)  Intake/Output from previous day: 05/05 0701 - 05/06 0700 In: 1580 [P.O.:480; I.V.:1000] Out: 1610 [Urine:1600; Blood:10] Intake/Output this shift: No intake/output data recorded.  Recent Labs    08/08/17 1119 08/09/17 0524 08/11/17 0332  HGB 11.2* 10.6* 10.4*   Recent Labs    08/09/17 0524 08/11/17 0332  WBC 8.3 10.7*  RBC 3.79* 3.62*  HCT 33.3* 31.6*  PLT 223 232   Recent Labs    08/09/17 0524 08/11/17 0332  NA 141 139  K 3.6 3.8  CL 102 100*  CO2 29 31  BUN 11 12  CREATININE 0.60 0.81  GLUCOSE 109* 152*  CALCIUM 9.1 8.7*   No results for input(s): LABPT, INR in the last 72 hours.  Neurologically intact Neurovascular intact Intact pulses distally Dorsiflexion/Plantar flexion intact Incision: moderate drainage No cellulitis present Compartment soft    Assessment/Plan: 1 Day Post-Op Procedure(s) (LRB): IRRIGATION AND DEBRIDEMENT FOOT (Right) Up with therapy  Wet to dry dressing changes twice daily until wound is healed Weight-bearing to the heel only for transfers (operative extremity) Continue with iv Zosyn per medicine dispo plan per [place] 08/11/2017, 8:13 AM

## 2017-08-11 NOTE — Progress Notes (Signed)
Pharmacy Antibiotic Note  Brittany [name] is a 72 y.o. female admitted on 08/08/2017 with cellulitis and right diabetic foot ulcer.  MRI shows soft tissue infection, possible superficial abscess, negative for osteomyelitis or septic joint.   Pharmacy has been consulted for vancomycin and Zosyn dosing.  Renal function is stable, afebrile, WBC 10.7.   Plan: Vanc 750mg  IV Q12H Zosyn EID 3.375gm IV Q8H Monitor renal fxn, clinical progress, vanc trough tomorrow if not yet narrowed   Height: 5\' 9"  (175.3 cm) Weight: 172 lb (78 kg) IBW/kg (Calculated) : 66.2  Temp (24hrs), Avg:97.7 F (36.5 C), Min:97.5 F (36.4 C), Max:97.9 F (36.6 C)  Recent Labs  Lab 08/06/17 1626 08/06/17 1635 08/08/17 1119 08/08/17 1127 08/09/17 0524 08/11/17 0332  WBC 9.9  --  10.4  --  8.3 10.7*  CREATININE 0.59  --  0.72  --  0.60 0.81  LATICACIDVEN  --  1.78  --  1.67  --   --     Estimated Creatinine Clearance: 68.5 mL/min (by C-G formula based on SCr of 0.81 mg/dL).    Allergies  Allergen Reactions  . Sulfonamide Derivatives Hives    Vanc 5/3 >> Zosyn 5/3 >>  5/1 BCx - 5/4 MRSA PCR - negative 5/5 right foot ulcer (deep) -    Tee Richeson D. Mina Marble, PharmD, BCPS, [place] Pager:  [phone] 08/11/2017, 8:29 AM

## 2017-08-11 NOTE — Progress Notes (Signed)
PROGRESS NOTE  COOKIE PORE CZY:[id number] DOB: October 26, 1945 DOA: 08/08/2017 PCP: Ann Held, DO  HPI/Recap of past 24 hours: Brittany [name] is a 72 y.o. female with medical history of DM type 2 with neuropathy, chronic pain who follows at the pain clinic, hypertension and GERD who presented to the ED on 5/1 with a right foot ulcer with drainage and cellulitis up above the knee for the past few days PTA. A venous duplex was negative for DVT she received Zosyn in the ED and was sent home PO AB. Symptoms continued to worsen and she presented to [place] center where she received a dose of vancomycin and was recommended to be admitted here at [facility] long.  Pt transferred to [facility] for further management per ortho, Dr Erlinda Hong, for [facility] excisional debridement of right foot skin, 08/10/2017  08/11/2017: Patient seen.  No new complaints.  We will continue antibiotics for now.  Will follow cultures (the culture has not grown any organism to date).  Assessment/Plan: Principal Problem:   Abscess Active Problems:   Essential hypertension   Cellulitis   Hypoglycemia without diagnosis of diabetes mellitus   Peripheral neuropathy  Right foot abscess with associated RLE cellulitis Currently afebrile, no leukocytosis BC x2, NGTD, LA WNL X-ray of right foot/MRI - negative for osteomyelitis or gas in the soft tissues Continue empiric vancomycin and Zosyn Monitor closely 08/11/2017: Continue antibiotics.  Likely transition to oral antibiotics prior to discharge.  Complete course of antibiotics.  MRI of the right foot noted, no evidence of osteomyelitis.  Anemia of chronic disease Hgb normal as of 03/2016, now between 10-11 Iron panel pending Daily CBC  Essential hypertension BP soft Continue amlodipine Hold bisoprolol, HCTZ, lasix (for pedal edema as per pt)  DM type 2 Well controlled A1c 6.2  Continue Januvia (replaced by Lady Gary), SSI, accu-checks Hold  Glucophage  Peripheral neuropathy (due to "back issues") Continue Neurontin, tizanidine, morphine Continue outpatient pain clinic   Code Status: Full  Family Communication: None at bedside  Disposition Plan: Transfer to [facility]   Consultants:  Orthopedics consulted  Procedures: None  Antimicrobials:  Vancomycin  Zosyn  DVT prophylaxis:  Lovenox   Objective: Vitals:   08/11/17 0011 08/11/17 0410 08/11/17 1439 08/11/17 1949  BP: (!) 127/54 123/61 100/68 121/84  Pulse: (!) 52 (!) 46 (!) 57 65  Resp:   18 15  Temp: 97.7 F (36.5 C) 97.9 F (36.6 C) 98.7 F (37.1 C) 98.4 F (36.9 C)  TempSrc: Oral Oral Oral Oral  SpO2: 98% 95% 98% 95%  Weight:      Height:        Intake/Output Summary (Last 24 hours) at 08/11/2017 2121 Last data filed at 08/11/2017 1800 Gross per 24 hour  Intake 1780 ml  Output 200 ml  Net 1580 ml   Filed Weights   08/08/17 1043  Weight: 78 kg (172 lb)    Exam:   General: NAD  Cardiovascular: S1, S2 present  Respiratory: CTAB  Abdomen: Soft, nontender, nondistended, bowel sounds present  Musculoskeletal: Right foot postop dressing change noted, good cap refill of exposed toes  Skin: Normal, otherwise as above  Psychiatry: Normal mood   Data Reviewed: CBC: Recent Labs  Lab 08/06/17 1626 08/08/17 1119 08/09/17 0524 08/11/17 0332  WBC 9.9 10.4 8.3 10.7*  NEUTROABS 7.4 8.1*  --  7.3  HGB 11.3* 11.2* 10.6* 10.4*  HCT 34.3* 33.3* 33.3* 31.6*  MCV 87.7 87.2 87.9 87.3  PLT 231  241 223 852   Basic Metabolic Panel: Recent Labs  Lab 08/06/17 1626 08/08/17 1119 08/09/17 0524 08/11/17 0332  NA 135 138 141 139  K 3.3* 3.5 3.6 3.8  CL 98* 100* 102 100*  CO2 27 28 29 31   GLUCOSE 200* 132* 109* 152*  BUN 15 13 11 12   CREATININE 0.59 0.72 0.60 0.81  CALCIUM 8.7* 8.9 9.1 8.7*   GFR: Estimated Creatinine Clearance: 68.5 mL/min (by C-G formula based on SCr of 0.81 mg/dL). Liver Function Tests: Recent Labs  Lab  08/06/17 1626  AST 18  ALT 11*  ALKPHOS 47  BILITOT 0.5  PROT 7.1  ALBUMIN 3.4*   No results for input(s): LIPASE, AMYLASE in the last 168 hours. No results for input(s): AMMONIA in the last 168 hours. Coagulation Profile: No results for input(s): INR, PROTIME in the last 168 hours. Cardiac Enzymes: No results for input(s): CKTOTAL, CKMB, CKMBINDEX, TROPONINI in the last 168 hours. BNP (last 3 results) No results for input(s): PROBNP in the last 8760 hours. HbA1C: No results for input(s): HGBA1C in the last 72 hours. CBG: Recent Labs  Lab 08/10/17 2233 08/11/17 0619 08/11/17 1143 08/11/17 1622 08/11/17 2104  GLUCAP 190* 155* 144* 84 114*   Lipid Profile: No results for input(s): CHOL, HDL, LDLCALC, TRIG, CHOLHDL, LDLDIRECT in the last 72 hours. Thyroid Function Tests: No results for input(s): TSH, T4TOTAL, FREET4, T3FREE, THYROIDAB in the last 72 hours. Anemia Panel: No results for input(s): VITAMINB12, FOLATE, FERRITIN, TIBC, IRON, RETICCTPCT in the last 72 hours. Urine analysis:    Component Value Date/Time   BILIRUBINUR neg 03/11/2016 1030   PROTEINUR neg 03/11/2016 1030   UROBILINOGEN 0.2 03/11/2016 1030   NITRITE neg 03/11/2016 1030   LEUKOCYTESUR large (3+) (A) 03/11/2016 1030   Sepsis Labs: @LABRCNTIP (procalcitonin:4,lacticidven:4)  ) Recent Results (from the past 240 hour(s))  Blood Culture (routine x 2)     Status: None   Collection Time: 08/06/17  4:12 PM  Result Value Ref Range Status   Specimen Description   Final    BLOOD RIGHT ANTECUBITAL Performed at [facility], [address] [place], [place] [address]    Special Requests   Final    BOTTLES DRAWN AEROBIC AND ANAEROBIC Blood Culture adequate volume Performed at [facility], [address] [place], [place] [address]    Culture   Final    NO GROWTH 5 DAYS Performed at [facility], [place] [address], [place], [place] [address]    Report Status 08/11/2017 FINAL   Final  Blood Culture (routine x 2)     Status: None   Collection Time: 08/06/17  4:23 PM  Result Value Ref Range Status   Specimen Description   Final    BLOOD BLOOD LEFT HAND Performed at [facility], [address] [place], [place] [address]    Special Requests   Final    BOTTLES DRAWN AEROBIC AND ANAEROBIC Blood Culture adequate volume Performed at [facility], [address] [place], [place] [address]    Culture   Final    NO GROWTH 5 DAYS Performed at [facility], [place] [address] [place], [place] [address]    Report Status 08/11/2017 FINAL  Final  Surgical pcr screen     Status: None   Collection Time: 08/09/17  6:54 PM  Result Value Ref Range Status   MRSA, PCR NEGATIVE NEGATIVE Final   Staphylococcus  aureus NEGATIVE NEGATIVE Final    Comment: (NOTE) The Xpert SA Assay (FDA approved for NASAL specimens in patients 64 years of age and older), is one component of a comprehensive surveillance program. It is not intended to diagnose infection nor to guide or monitor treatment. Performed at [facility], [place] [address], [place], [place] [address]   Aerobic/Anaerobic Culture (surgical/deep wound)     Status: None (Preliminary result)   Collection Time: 08/10/17  8:23 AM  Result Value Ref Range Status   Specimen Description ULCER RIGHT FOOT  Final   Special Requests PATIENT ON FOLLOWING ZOSYN  Final   Gram Stain NO WBC SEEN NO ORGANISMS SEEN   Final   Culture   Final    NO GROWTH 1 DAY Performed at [facility], [place] [address], [place], [place] [address]    Report Status PENDING  Incomplete      Studies: No results found.  Scheduled Meds: . amLODipine  5 mg Oral Daily  . aspirin  81 mg Oral QHS  . atorvastatin  10 mg Oral Daily  . docusate sodium  100 mg Oral BID  . enoxaparin (LOVENOX) injection  40 mg Subcutaneous Q24H  . escitalopram  10 mg Oral Daily  . estradiol  1 mg Oral Daily  . folic acid  0.5 mg Oral Daily   . gabapentin  800 mg Oral QID  . insulin aspart  0-15 Units Subcutaneous TID WC  . insulin aspart  0-5 Units Subcutaneous QHS  . linagliptin  5 mg Oral Daily  . morphine  60 mg Oral Q8H  . multivitamin with minerals  1 tablet Oral Daily  . pantoprazole  40 mg Oral Daily  . tiZANidine  4 mg Oral TID    Continuous Infusions: . piperacillin-tazobactam (ZOSYN)  IV Stopped (08/11/17 1743)  . vancomycin Stopped (08/11/17 1420)     LOS: 3 days     Bonnell Public, MD [phone] [facility]  If 7PM-7AM, please contact night-coverage [email or web address] Password [email or web address] 08/11/2017, 9:21 PM

## 2017-08-12 DIAGNOSIS — L0291 Cutaneous abscess, unspecified: Secondary | ICD-10-CM

## 2017-08-12 LAB — CBC WITH DIFFERENTIAL/PLATELET
BASOS PCT: 1 %
Basophils Absolute: 0.1 10*3/uL (ref 0.0–0.1)
EOS ABS: 0.4 10*3/uL (ref 0.0–0.7)
Eosinophils Relative: 5 %
HCT: 32.6 % — ABNORMAL LOW (ref 36.0–46.0)
HEMOGLOBIN: 10.6 g/dL — AB (ref 12.0–15.0)
Lymphocytes Relative: 29 %
Lymphs Abs: 2.6 10*3/uL (ref 0.7–4.0)
MCH: 28.5 pg (ref 26.0–34.0)
MCHC: 32.5 g/dL (ref 30.0–36.0)
MCV: 87.6 fL (ref 78.0–100.0)
Monocytes Absolute: 0.8 10*3/uL (ref 0.1–1.0)
Monocytes Relative: 9 %
NEUTROS PCT: 56 %
Neutro Abs: 5 10*3/uL (ref 1.7–7.7)
Platelets: 246 10*3/uL (ref 150–400)
RBC: 3.72 MIL/uL — ABNORMAL LOW (ref 3.87–5.11)
RDW: 13.2 % (ref 11.5–15.5)
WBC: 8.8 10*3/uL (ref 4.0–10.5)

## 2017-08-12 LAB — BASIC METABOLIC PANEL
Anion gap: 8 (ref 5–15)
BUN: 16 mg/dL (ref 6–20)
CALCIUM: 8.6 mg/dL — AB (ref 8.9–10.3)
CO2: 30 mmol/L (ref 22–32)
CREATININE: 0.78 mg/dL (ref 0.44–1.00)
Chloride: 103 mmol/L (ref 101–111)
Glucose, Bld: 119 mg/dL — ABNORMAL HIGH (ref 65–99)
Potassium: 4.2 mmol/L (ref 3.5–5.1)
SODIUM: 141 mmol/L (ref 135–145)

## 2017-08-12 LAB — GLUCOSE, CAPILLARY
GLUCOSE-CAPILLARY: 108 mg/dL — AB (ref 65–99)
Glucose-Capillary: 110 mg/dL — ABNORMAL HIGH (ref 65–99)

## 2017-08-12 LAB — VANCOMYCIN, TROUGH: Vancomycin Tr: 12 ug/mL — ABNORMAL LOW (ref 15–20)

## 2017-08-12 MED ORDER — POLYVINYL ALCOHOL 1.4 % OP SOLN
1.0000 [drp] | OPHTHALMIC | Status: DC | PRN
  Administered 2017-08-12: 1 [drp] via OPHTHALMIC
  Filled 2017-08-12: qty 15

## 2017-08-12 MED ORDER — VANCOMYCIN HCL IN DEXTROSE 1-5 GM/200ML-% IV SOLN: 1000.0000 mg | Freq: Two times a day (BID) | INTRAVENOUS | Status: DC

## 2017-08-12 MED ORDER — AMOXICILLIN-POT CLAVULANATE 875-125 MG PO TABS
1.0000 | ORAL_TABLET | Freq: Two times a day (BID) | ORAL | Status: DC
  Administered 2017-08-12: 1 via ORAL
  Filled 2017-08-12: qty 1

## 2017-08-12 MED ORDER — LINEZOLID 600 MG PO TABS: 600.0000 mg | ORAL_TABLET | Freq: Two times a day (BID) | ORAL | 0 refills | Status: AC

## 2017-08-12 MED ORDER — AMOXICILLIN-POT CLAVULANATE 875-125 MG PO TABS: 1.0000 | ORAL_TABLET | Freq: Two times a day (BID) | ORAL | 0 refills | Status: AC

## 2017-08-12 MED ORDER — POLYVINYL ALCOHOL 1.4 % OP SOLN: 1.0000 [drp] | OPHTHALMIC | 0 refills | Status: DC | PRN

## 2017-08-12 NOTE — Discharge Summary (Signed)
Physician Discharge Summary  Patient ID: Brittany [name] MRN: [id number] DOB/AGE: 70-Apr-1947 70 y.o.  Admit date: 08/08/2017 Discharge date: 08/12/2017  Admission Diagnoses:  Discharge Diagnoses:  Principal Problem:   Abscess Active Problems:   Essential hypertension   Cellulitis   Hypoglycemia without diagnosis of diabetes mellitus   Peripheral neuropathy   Discharged Condition: stable  Hospital Course:  Patient is a 72 year old Caucasian female with past medical history significant for DM type 2 with neuropathy, chronic pain who follows at the pain clinic, hypertension and GERD.  Patient presented to the ED on 08/06/2017 with right foot ulcer with drainage and cellulitis up above the knee.  Venous duplex ultrasound of the right lower extremity was negative for DVT.  She received Zosyn in the ED and was sent home PO AB. Symptoms continued to worsen and she presented to [place] center where she received a dose of vancomycin and was recommended to be admitted here at [facility].  Patient was admitted on 08/08/2017.  Patient was continued on IV antibiotics (IV Zosyn and vancomycin).  Wound was cultured, but has not grown any organisms to date.  On 08/10/2017, the patient underwent sharp excisional debridement of right foot skin by Dr. Erlinda Hong.   MRI of the right foot was done that was negative for osteomyelitis.  She will be discharged back home on oral antibiotics.  Patient will follow with PCP and the orthopedic team on discharge.   Right foot abscess with associated right lower extremity cellulitis: Kindly see above.  Anemia of chronic disease: Continue to monitor.  Essential hypertension: Monitor closely on discharge.  Blood pressure was on the low side.  Continue to adjust antihypertensives.  DM type 2: HbA1c was 6.2%.    Peripheral neuropathy: Continue Neurontin.  Consults: orthopedic surgery  Significant Diagnostic Studies:  MRI of the right foot:  "Plantar forefoot soft  tissue ulcer at the level of the 1st metatarsophalangeal joint with small underlying tract and diffuse surrounding soft tissue enhancement consistent with soft tissue Infection.  Superficial fluid collection versus bandages medial to the 1st metatarsophalangeal joint. If there are no bandages in this area, this could reflect a skin blister or superficial abscess.  No evidence of osteomyelitis or septic joint.  Midfoot degenerative changes. Degenerative and postsurgical changes at the 1st MTP joint".  Discharge Exam: Blood pressure (!) 132/54, pulse (!) 58, temperature 97.9 F (36.6 C), temperature source Oral, resp. rate 16, height '5\' 9"'$  (1.753 m), weight 78 kg (172 lb), SpO2 99 %.   Disposition: Discharge disposition: 06-Home-Health Care Svc  Discharge Instructions    Call MD for:   Complete by:  As directed    Call MD if symptoms worsen.   Diet - low sodium heart healthy   Complete by:  As directed    Diet Carb Modified   Complete by:  As directed    Increase activity slowly   Complete by:  As directed      Allergies as of 08/12/2017      Reactions   Sulfonamide Derivatives Hives      Medication List    STOP taking these medications   bisoprolol-hydrochlorothiazide 10-6.25 MG tablet Commonly known as:  ZIAC   desonide 0.05 % cream Commonly known as:  DESOWEN   doxepin 25 MG capsule Commonly known as:  SINEQUAN   doxycycline 100 MG capsule Commonly known as:  VIBRAMYCIN   furosemide 40 MG tablet Commonly known as:  LASIX   OVER THE COUNTER MEDICATION  potassium chloride SA 20 MEQ tablet Commonly known as:  K-DUR,KLOR-CON     TAKE these medications   amLODipine 5 MG tablet Commonly known as:  NORVASC TAKE 1 TABLET DAILY   amoxicillin-clavulanate 875-125 MG tablet Commonly known as:  AUGMENTIN Take 1 tablet by mouth 2 (two) times daily for 10 days.   aspirin 81 MG tablet Take 81 mg by mouth at bedtime.   atorvastatin 10 MG tablet Commonly known as:   LIPITOR Take 1 tablet (10 mg total) by mouth daily. What changed:  when to take this   calcium carbonate 750 MG chewable tablet Commonly known as:  TUMS EX Chew 1 tablet by mouth daily. For acid reflux   docusate sodium 100 MG capsule Commonly known as:  COLACE Take 100 mg by mouth 2 (two) times daily.   escitalopram 10 MG tablet Commonly known as:  LEXAPRO Take 1 tablet (10 mg total) by mouth daily.   estradiol 1 MG tablet Commonly known as:  ESTRACE TAKE 1 TABLET DAILY   folic acid 427 MCG tablet Commonly known as:  FOLVITE Take 400 mcg by mouth daily.   gabapentin 800 MG tablet Commonly known as:  NEURONTIN Take 1 tablet (800 mg total) by mouth 4 (four) times daily.   glucose blood test strip Commonly known as:  ONE TOUCH ULTRA TEST Check Blood sugar once daily   l-methylfolate-B6-B12 3-35-2 MG Tabs tablet Commonly known as:  METANX Take 1 tablet by mouth daily.   linezolid 600 MG tablet Commonly known as:  ZYVOX Take 1 tablet (600 mg total) by mouth 2 (two) times daily for 10 days.   loratadine 10 MG tablet Commonly known as:  CLARITIN Take 10 mg by mouth daily as needed. For allergies   morphine 60 MG 12 hr tablet Commonly known as:  MS CONTIN Take 60 mg by mouth 3 (three) times daily.   multivitamin with minerals Tabs tablet Take 1 tablet by mouth daily.   omega-3 acid ethyl esters 1 g capsule Commonly known as:  LOVAZA Take 1 g by mouth daily.   omeprazole 40 MG capsule Commonly known as:  PRILOSEC TAKE 1 CAPSULE DAILY   ONE TOUCH ULTRA MINI w/Device Kit Use as directed once a day.  Dx Code:  C62.37   ONETOUCH DELICA LANCETS 62G Misc Check blood sugar once daily   polyvinyl alcohol 1.4 % ophthalmic solution Commonly known as:  LIQUIFILM TEARS Place 1 drop into both eyes as needed for dry eyes.   sitaGLIPtin-metformin 50-1000 MG tablet Commonly known as:  JANUMET Take 2 tablets po at bedtime daily   tiZANidine 4 MG tablet Commonly known  as:  ZANAFLEX Take 1 tablet by mouth 3 (three) times daily.   Vitamin D 1000 units capsule Take 1,000 Units by mouth daily.            Durable Medical Equipment  (From admission, onward)        Start     Ordered   08/12/17 1121  For home use only DME 3 n 1  Once     08/12/17 1120   08/12/17 1117  For home use only DME lightweight manual wheelchair with seat cushion  Once    Comments:  Patient suffers from right foot infection, non weight bearing which impairs their ability to perform daily activities like ambulating  in the home.  A cane will not resolve  issue with performing activities of daily living. A wheelchair will allow patient to safely perform  daily activities. Patient is not able to propel themselves in the home using a standard weight wheelchair due to generalized weakness. Patient can self propel in the lightweight wheelchair.  Accessories: elevating leg rests (ELRs), wheel locks, extensions and anti-tippers.   08/12/17 1119     Follow-up Information    Leandrew Koyanagi, MD In 1 week.   Specialty:  Orthopedic Surgery Why:  For wound re-check Contact information: [place] [address] [phone]           Signed: Bonnell Public 08/12/2017, 12:16 PM

## 2017-08-12 NOTE — Evaluation (Signed)
Physical Therapy Evaluation Patient Details Name: Brittany [name] MRN: [id number] DOB: 09-Nov-1945 Today's Date: 08/12/2017   History of Present Illness  Pt is a 72 y/o female transferred from [facility] secondary to R foot plantar forefoot ulcer. Imaging negative for osteomyelitis. Pt is s/p I and D of R foot on 5/5. PMH includes HTN, DM, and neuropathy.   Clinical Impression  Pt is s/p surgery above with deficits below. Pt requiring min guard A for transfer to chair using RW. Per notes, pt to transfer only. Pt reports friend will be staying with her initially. Will need to cover Brittany [name] mobility training once wheelchair delivered; notified RN. Will continue to follow acutely to maximize functional mobility independence and safety.     Follow Up Recommendations Home health PT;Supervision for mobility/OOB    Equipment Recommendations  Wheelchair (measurements PT);Wheelchair cushion (measurements PT);3in1 (PT)    Recommendations for Other Services OT consult     Precautions / Restrictions Precautions Precautions: Fall Required Braces or Orthoses: Other Brace/Splint Other Brace/Splint: Darco shoe  Restrictions Weight Bearing Restrictions: Yes RLE Weight Bearing: Partial weight bearing RLE Partial Weight Bearing Percentage or Pounds: Heel only Other Position/Activity Restrictions: Heel only WB on RLE for transfers only; no ambulation.       Mobility  Bed Mobility Overal bed mobility: Needs Assistance Bed Mobility: Supine to Sit     Supine to sit: Supervision     General bed mobility comments: Supervision for safety.   Transfers Overall transfer level: Needs assistance Equipment used: Rolling walker (2 wheeled) Transfers: Sit to/from [facility] Sit to Stand: Min guard Stand pivot transfers: Min guard       General transfer comment: Min guard for safety during transfers. Verbal cues for safe hand placement and for sequencing using RW. Verbal cues to weightbear on heel  only on RLE.   Ambulation/Gait             General Gait Details: Per orders, pt restricted from ambulation. Transfers only.   Stairs            Wheelchair Mobility    Modified Rankin (Stroke Patients Only)       Balance Overall balance assessment: Needs assistance Sitting-balance support: No upper extremity supported;Feet supported Sitting balance-Leahy Scale: Good     Standing balance support: Bilateral upper extremity supported;During functional activity Standing balance-Leahy Scale: Poor Standing balance comment: Reliant on BUE support.                              Pertinent Vitals/Pain Pain Assessment: Faces Faces Pain Scale: Hurts a little bit Pain Location: R foot  Pain Descriptors / Indicators: Operative site guarding Pain Intervention(s): Limited activity within patient's tolerance;Monitored during session;Repositioned    Home Living Family/patient expects to be discharged to:: Private residence Living Arrangements: Alone Available Help at Discharge: Friend(s);Available 24 hours/day Type of Home: House Home Access: Level entry     Home Layout: One level Home Equipment: Walker - standard      Prior Function Level of Independence: Independent               Hand Dominance        Extremity/Trunk Assessment   Upper Extremity Assessment Upper Extremity Assessment: Overall WFL for tasks assessed    Lower Extremity Assessment Lower Extremity Assessment: RLE deficits/detail;LLE deficits/detail RLE Deficits / Details: RLE bandaged. Deficits consistent with post op pain/weakness.  RLE Sensation: history of peripheral neuropathy LLE  Sensation: history of peripheral neuropathy    Cervical / Trunk Assessment Cervical / Trunk Assessment: Normal  Communication   Communication: No difficulties  Cognition Arousal/Alertness: Awake/alert Behavior During Therapy: WFL for tasks assessed/performed Overall Cognitive Status: Within  Functional Limits for tasks assessed                                        General Comments      Exercises     Assessment/Plan    PT Assessment Patient needs continued PT services  PT Problem List Decreased strength;Decreased mobility;Decreased knowledge of use of DME;Impaired sensation       PT Treatment Interventions DME instruction;Functional mobility training;Therapeutic exercise;Balance training;Therapeutic activities;Wheelchair mobility training;Patient/family education    PT Goals (Current goals can be found in the Care Plan section)  Acute Rehab PT Goals Patient Stated Goal: to go home  PT Goal Formulation: With patient Time For Goal Achievement: 08/26/17 Potential to Achieve Goals: Good    Frequency Min 3X/week   Barriers to discharge        Co-evaluation               AM-PAC PT "6 Clicks" Daily Activity  Outcome Measure Difficulty turning over in bed (including adjusting bedclothes, sheets and blankets)?: None Difficulty moving from lying on back to sitting on the side of the bed? : A Little Difficulty sitting down on and standing up from a chair with arms (e.g., wheelchair, bedside commode, etc,.)?: Unable Help needed moving to and from a bed to chair (including a wheelchair)?: A Little Help needed walking in hospital room?: A Lot Help needed climbing 3-5 steps with a railing? : Total 6 Click Score: 14    End of Session Equipment Utilized During Treatment: Gait belt Activity Tolerance: Patient tolerated treatment well Patient left: in chair;with call bell/phone within reach Nurse Communication: Mobility status PT Visit Diagnosis: Other abnormalities of gait and mobility (R26.89);Difficulty in walking, not elsewhere classified (R26.2)    Time: 7416-3845 PT Time Calculation (min) (ACUTE ONLY): 23 min   Charges:   PT Evaluation $PT Eval Low Complexity: 1 Low PT Treatments $Therapeutic Activity: 8-22 mins   PT G Codes:         Leighton Ruff, PT, DPT  Acute Rehabilitation Services  Pager: [phone]   Rudean Hitt 08/12/2017, 11:31 AM

## 2017-08-12 NOTE — Progress Notes (Signed)
      Durable Medical Equipment  (From admission, onward)        Start     Ordered   08/12/17 1121  For home use only DME 3 n 1  Once     08/12/17 1120   08/12/17 1117  For home use only DME lightweight manual wheelchair with seat cushion  Once    Comments:  Patient suffers from right foot infection, non weight bearing which impairs their ability to perform daily activities like ambulating  in the home.  A cane will not resolve  issue with performing activities of daily living. A wheelchair will allow patient to safely perform daily activities. Patient is not able to propel themselves in the home using a standard weight wheelchair due to generalized weakness. Patient can self propel in the lightweight wheelchair.  Accessories: elevating leg rests (ELRs), wheel locks, extensions and anti-tippers.   08/12/17 [place], PT, DPT  Acute Rehabilitation Services  Pager: [phone]

## 2017-08-12 NOTE — Progress Notes (Signed)
Subjective: 2 Days Post-Op Procedure(s) (LRB): IRRIGATION AND DEBRIDEMENT FOOT (Right) Patient reports pain as mild.  Patient feeling very good this am.  Ready to go home asap!  Objective: Vital signs in last 24 hours: Temp:  [97.9 F (36.6 C)-98.7 F (37.1 C)] 97.9 F (36.6 C) (05/07 0443) Pulse Rate:  [57-65] 58 (05/07 0443) Resp:  [15-18] 16 (05/07 0443) BP: (100-132)/(54-84) 132/54 (05/07 0443) SpO2:  [95 %-99 %] 99 % (05/07 0443)  Intake/Output from previous day: 05/06 0701 - 05/07 0700 In: 2140 [P.O.:1240; IV Piggyback:900] Out: 200 [Urine:200] Intake/Output this shift: No intake/output data recorded.  Recent Labs    08/11/17 0332 08/12/17 0517  HGB 10.4* 10.6*   Recent Labs    08/11/17 0332 08/12/17 0517  WBC 10.7* 8.8  RBC 3.62* 3.72*  HCT 31.6* 32.6*  PLT 232 246   Recent Labs    08/11/17 0332 08/12/17 0517  NA 139 141  K 3.8 4.2  CL 100* 103  CO2 31 30  BUN 12 16  CREATININE 0.81 0.78  GLUCOSE 152* 119*  CALCIUM 8.7* 8.6*   No results for input(s): LABPT, INR in the last 72 hours.  Neurologically intact Neurovascular intact Intact pulses distally Dorsiflexion/Plantar flexion intact Incision: scant drainage No cellulitis present Compartment soft  Well healing wound    Assessment/Plan: 2 Days Post-Op Procedure(s) (LRB): IRRIGATION AND DEBRIDEMENT FOOT (Right) Advance diet Up with therapy  Wet to dry dressing changes BID (will need to do this until wound is healed) Weight bearing as tolerated to operative extremity with heel only for transfers Still awaiting cx Ok from ortho standpoint to d/c home    Aundra Dubin 08/12/2017, 7:41 AM

## 2017-08-12 NOTE — Progress Notes (Signed)
Pharmacy Antibiotic Note  Brittany [name] is a 72 y.o. female admitted on 08/08/2017 with cellulitis and right diabetic foot ulcer.  MRI shows soft tissue infection, possible superficial abscess, negative for osteomyelitis or septic joint.   Pharmacy has been consulted for vancomycin and Zosyn dosing.  Renal function is stable and vancomycin trough is acceptable, prefer it to be ~15 mcg/mL.  Afebrile, WBC normalized.   Plan: Increase vanc to 1gm IV Q12H Zosyn EID 3.375gm IV Q8H Monitor renal fxn, clinical progress, weekly vanc trough if not yet narrowed   Height: 5\' 9"  (175.3 cm) Weight: 172 lb (78 kg) IBW/kg (Calculated) : 66.2  Temp (24hrs), Avg:98.3 F (36.8 C), Min:97.9 F (36.6 C), Max:98.7 F (37.1 C)  Recent Labs  Lab 08/06/17 1626 08/06/17 1635 08/08/17 1119 08/08/17 1127 08/09/17 0524 08/11/17 0332 08/12/17 0517 08/12/17 1204  WBC 9.9  --  10.4  --  8.3 10.7* 8.8  --   CREATININE 0.59  --  0.72  --  0.60 0.81 0.78  --   LATICACIDVEN  --  1.78  --  1.67  --   --   --   --   VANCOTROUGH  --   --   --   --   --   --   --  12*    Estimated Creatinine Clearance: 69.4 mL/min (by C-G formula based on SCr of 0.78 mg/dL).    Allergies  Allergen Reactions  . Sulfonamide Derivatives Hives    Vanc 5/3 >> Zosyn 5/3 >>  5/7 VT = 12 mcg/mL on 750mg  q12 >> 1g q12  5/1 BCx - 5/4 MRSA PCR - negative 5/5 right foot ulcer (deep) -    Tyler Cubit D. Mina Marble, PharmD, BCPS, [place] Pager:  [phone] 08/12/2017, 1:32 PM

## 2017-08-12 NOTE — Care Management Important Message (Signed)
Important Message  Patient Details  Name: Brittany [name] MRN: [id number] Date of Birth: 08/11/1945   Medicare Important Message Given:  Yes    Orbie Pyo 08/12/2017, 3:24 PM

## 2017-08-12 NOTE — Progress Notes (Signed)
08/12/17 1527  PT Visit Information  Last PT Received On 08/12/17  Assistance Needed +1  History of Present Illness Pt is a 72 y/o female transferred from [facility] secondary to R foot plantar forefoot ulcer. Imaging negative for osteomyelitis. Pt is s/p I and D of R foot on 5/5. PMH includes HTN, DM, and neuropathy.   Subjective Data  Patient Stated Goal to go home   Precautions  Precautions Fall  Required Braces or Orthoses Other Brace/Splint  Other Brace/Splint Darco shoe   Restrictions  Weight Bearing Restrictions Yes  RLE Weight Bearing PWB  RLE Partial Weight Bearing Percentage or Pounds Heel only  Other Position/Activity Restrictions Heel only WB on RLE for transfers only; no ambulation.   Pain Assessment  Pain Assessment Faces  Faces Pain Scale 2  Pain Location R foot   Pain Descriptors / Indicators Operative site guarding  Pain Intervention(s) Limited activity within patient's tolerance;Monitored during session;Repositioned  Cognition  Arousal/Alertness Awake/alert  Behavior During Therapy WFL for tasks assessed/performed  Overall Cognitive Status Within Functional Limits for tasks assessed  Bed Mobility  General bed mobility comments In chair upon entry.   Transfers  Overall transfer level Needs assistance  Equipment used Rolling walker (2 wheeled)  Transfers Sit to/from [facility]  Sit to Stand Supervision  Stand pivot transfers Supervision  General transfer comment Supervison for safety. Demonstrated safe hand placement. Cues for proximity to walker when performing transfer.   Doctor, hospital Yes  Wheelchair propulsion Both upper extremities;Left lower extremity  Wheelchair parts Supervision/cueing  Distance 36  Wheelchair Assistance Details (indicate cue type and reason) Supervision for cues for propulsion, especially when turning. Reviewed safety and importance of locking brakes. Educated about positioning when performing  transfers and how to adjust leg rests.   Balance  Overall balance assessment Needs assistance  Sitting-balance support No upper extremity supported;Feet supported  Sitting balance-Leahy Scale Good  Standing balance support Bilateral upper extremity supported;During functional activity  Standing balance-Leahy Scale Poor  Standing balance comment Reliant on BUE support.   General Comments  General comments (skin integrity, edema, etc.) Friend present during session. Reviewed how she should appropriately assist pt at home.   PT - End of Session  Equipment Utilized During Treatment Gait belt  Activity Tolerance Patient tolerated treatment well  Patient left in chair;with call bell/phone within reach;with family/visitor present  Nurse Communication Mobility status;Other (comment) (need for WC before d/c home )   PT - Assessment/Plan  PT Plan Current plan remains appropriate  PT Visit Diagnosis Other abnormalities of gait and mobility (R26.89);Difficulty in walking, not elsewhere classified (R26.2)  PT Frequency (ACUTE ONLY) Min 3X/week  Recommendations for Other Services OT consult  Follow Up Recommendations Home health PT;Supervision for mobility/OOB  PT equipment Wheelchair (measurements PT);Wheelchair cushion (measurements PT);3in1 (PT)  AM-PAC PT "6 Clicks" Daily Activity Outcome Measure  Difficulty turning over in bed (including adjusting bedclothes, sheets and blankets)? 4  Difficulty moving from lying on back to sitting on the side of the bed?  3  Difficulty sitting down on and standing up from a chair with arms (e.g., wheelchair, bedside commode, etc,.)? 3  Help needed moving to and from a bed to chair (including a wheelchair)? 3  Help needed walking in hospital room? 2  Help needed climbing 3-5 steps with a railing?  1  6 Click Score 16  Mobility G Code  CK  PT Goal Progression  Progress towards PT goals Progressing toward goals  Acute Rehab PT Goals  PT Goal Formulation With  patient  Time For Goal Achievement 08/26/17  Potential to Achieve Goals Good  PT Time Calculation  PT Start Time (ACUTE ONLY) 1450  PT Stop Time (ACUTE ONLY) 1523  PT Time Calculation (min) (ACUTE ONLY) 33 min  PT General Charges  $$ ACUTE PT VISIT 1 Visit  PT Treatments  $Therapeutic Activity 8-22 mins  $Wheel Chair Management 8-22 mins   Returned in afternoon to review [facility] navigation and safety with transfers. Pt able to transfer to [facility] with supervision and use of RW. Educated about safety with WC navigation (locking breaks, leg rest management, etc.) with pt and pt's friend. Pt performed WC mobility with supervision and cues for navigation technique. Pt still waiting for WC to arrive; notified RN. Will continue to follow acutely to maximize functional mobility independence and safety.   Leighton Ruff, PT, DPT  Acute Rehabilitation Services  Pager: [phone]

## 2017-08-12 NOTE — Progress Notes (Signed)
RN gave patient Discharge instructions and did dressing chnage to show patient how to do dressing changes q daily. Pt IV had been removed she is waiting for her wheelchair and potty chair to be delivered to D/C

## 2017-08-13 ENCOUNTER — Telehealth: Payer: Self-pay | Admitting: Family Medicine

## 2017-08-13 NOTE — Telephone Encounter (Signed)
Copied from [place] [id number] [phone]. Topic: Quick Communication - See Telephone Encounter >> Aug 13, 2017  3:48 PM Aurelio Brash B wrote: CRM for notification. See Telephone encounter for: 08/13/17. PT was discharged from hospital yesterday and was prescribed two antibiotics one of which was $700 ( linezolid (ZYVOX) 600 MG tablet) she could not get that one and the pharmist told her to contact her pcp  to see if they could prescribe her something different since they could not get in touch with the dr who prescribed the linezolid.  The other antibiotic she was prescribed was amoxicillin-clavulanate (AUGMENTIN) 875-125 MG tablet and she got that one and has started taking it   [place], [place] [phone] (Phone) [phone] (Fax)

## 2017-08-14 ENCOUNTER — Telehealth (INDEPENDENT_AMBULATORY_CARE_PROVIDER_SITE_OTHER): Payer: Self-pay | Admitting: Orthopaedic Surgery

## 2017-08-14 NOTE — Telephone Encounter (Signed)
She needs to contact the orthopedic-- they prescribed it

## 2017-08-14 NOTE — Telephone Encounter (Signed)
According to the discharge summary, the only abx that she was discharged on was augmentin.

## 2017-08-14 NOTE — Telephone Encounter (Signed)
Patient checking status, please advise °

## 2017-08-14 NOTE — Telephone Encounter (Signed)
Patient left voicemail stating that she received her amoxicillin but her other antibiotic that was prescribed to her is needing a prior authorization. CB #  [phone]

## 2017-08-14 NOTE — Telephone Encounter (Signed)
See message below °

## 2017-08-14 NOTE — Telephone Encounter (Signed)
Left message on machine that patient should contact orthopedic surgeon

## 2017-08-15 ENCOUNTER — Ambulatory Visit (INDEPENDENT_AMBULATORY_CARE_PROVIDER_SITE_OTHER): Payer: Medicare HMO | Admitting: Orthopaedic Surgery

## 2017-08-15 DIAGNOSIS — S91301D Unspecified open wound, right foot, subsequent encounter: Secondary | ICD-10-CM

## 2017-08-15 LAB — AEROBIC/ANAEROBIC CULTURE (SURGICAL/DEEP WOUND)
CULTURE: NO GROWTH
GRAM STAIN: NONE SEEN

## 2017-08-15 LAB — AEROBIC/ANAEROBIC CULTURE W GRAM STAIN (SURGICAL/DEEP WOUND)

## 2017-08-15 NOTE — Telephone Encounter (Signed)
Patient was seen today and Dr advised

## 2017-08-15 NOTE — Progress Notes (Signed)
   Post-Op Visit Note   Patient: Brittany [name]           Date of Birth: 01-16-1946           MRN: [id number] Visit Date: 08/15/2017 PCP: Ann Held, DO   Assessment & Plan:  Chief Complaint:  Chief Complaint  Patient presents with  . Right Foot - Pain, Routine Post Op   Visit Diagnoses:  1. Open wound of right foot, subsequent encounter     Plan: Patient is 1 week status post debridement of right diabetic foot wounds.  She is overall doing well.  She is taking Augmentin.  Unremarkable without any evidence of worsening.  At this point continue with wet-to-dry dressings.  Home health nursing was set up with [facility].  Continue with Augmentin.  Limit weightbearing to the forefoot.  Follow-up in a week for another wound check.  Follow-Up Instructions: Return in about 1 week (around 08/22/2017).   Orders:  No orders of the defined types were placed in this encounter.  No orders of the defined types were placed in this encounter.   Imaging: No results found.  PMFS History: Patient Active Problem List   Diagnosis Date Noted  . Open wound of right foot 08/15/2017  . Cellulitis 08/08/2017  . Hypoglycemia without diagnosis of diabetes mellitus 08/08/2017  . Peripheral neuropathy 08/08/2017  . Abscess 08/08/2017  . Hyperlipidemia LDL goal <70 09/22/2016  . Tooth pain 09/03/2012  . Edema 09/03/2012  . Supraclavicular fossa fullness 06/18/2012  . Hair loss 09/18/2010  . Fatigue 09/18/2010  . DYSPEPSIA&OTHER [facility] DISORDERS FUNCTION STOMACH 05/08/2010  . DIARRHEA 05/08/2010  . DIZZINESS 04/19/2010  . OTHER ACUTE SINUSITIS 02/28/2010  . NAUSEA 02/28/2010  . KNEE PAIN, LEFT 02/15/2010  . URI 07/09/2007  . GERD 02/12/2007  . LOW BACK PAIN 02/12/2007  . Essential hypertension 09/03/2006  . HOT FLASHES 09/03/2006  . INSOMNIA 09/03/2006   Past Medical History:  Diagnosis Date  . Chronic pain    In pain clinic   . Diabetes mellitus   . GERD (gastroesophageal  reflux disease)   . Hypertension   . Neuropathy     Family History  Problem Relation Age of Onset  . Cancer Mother        breast  . Depression Mother        bipolar  . Breast cancer Mother 71  . Heart disease Father 78       MI  . Diabetes Brother   . Kidney disease Brother   . Hypertension Brother   . Hyperlipidemia Brother   . Stroke Brother   . Coronary artery disease Unknown   . Diabetes Unknown     Past Surgical History:  Procedure Laterality Date  . I&D EXTREMITY Right 08/10/2017   Procedure: IRRIGATION AND DEBRIDEMENT FOOT;  Surgeon: Leandrew Koyanagi, MD;  Location: [place];  Service: Orthopedics;  Laterality: Right;  . LUMBAR FUSION     Social History   Occupational History  . Not on file  Tobacco Use  . Smoking status: Never Smoker  . Smokeless tobacco: Never Used  Substance and Sexual Activity  . Alcohol use: No  . Drug use: No  . Sexual activity: Not Currently    Partners: Male

## 2017-08-18 ENCOUNTER — Telehealth (INDEPENDENT_AMBULATORY_CARE_PROVIDER_SITE_OTHER): Payer: Self-pay | Admitting: Orthopaedic Surgery

## 2017-08-18 NOTE — Telephone Encounter (Signed)
Called pt no VM available to verify info

## 2017-08-18 NOTE — Telephone Encounter (Signed)
Ptaient called concerning her right foot. I told patient [facility] would be coming out to her home. Patient said Dr Erlinda Hong did not want [facility] looking after her foot and gave her another company that would come out to her home. Patient said she could not remember the name Dr. Erlinda Hong told her. The number to contact patient [phone]

## 2017-08-18 NOTE — Telephone Encounter (Signed)
Ok please tell her that [facility] is out of network for her.  Yes I do want [facility] to see her.

## 2017-08-18 NOTE — Telephone Encounter (Signed)
[facility] was unable to see her-ins out of network. That is why we chosed [facility].  See message below.

## 2017-08-18 NOTE — Telephone Encounter (Signed)
Can you let her know its  [facility] [place], [address] [place], [place] [address] Phone: [phone]

## 2017-08-18 NOTE — Telephone Encounter (Addendum)
Call back [phone]  Pt called left VM  Pt called would like to know the name of home care facility and telephone number

## 2017-08-19 ENCOUNTER — Telehealth (INDEPENDENT_AMBULATORY_CARE_PROVIDER_SITE_OTHER): Payer: Self-pay | Admitting: Orthopaedic Surgery

## 2017-08-19 ENCOUNTER — Telehealth: Payer: Self-pay | Admitting: *Deleted

## 2017-08-19 NOTE — Telephone Encounter (Signed)
Patient left VM stating no one from home health has came out to change the bandage on her foot. She states her friend who was helping her, has to leave and will not be available. Please call [phone]

## 2017-08-19 NOTE — Telephone Encounter (Signed)
Patient left a vm stating that she called [facility] to see if they were coming out to her house tomorrow and they advised her that they have not received a referral for them to see her.  [facility] fax is [phone], pt would also like for you to call her back.  CB#[phone].  Thank you.

## 2017-08-19 NOTE — Telephone Encounter (Signed)
Called [facility] states they never received Referral so I refaxed and I advised her that order was refaxed and they should come out. If she does not hear back I did provide [facility] phone number. Also offered her to come in if I need to change her dressing.

## 2017-08-20 ENCOUNTER — Telehealth (INDEPENDENT_AMBULATORY_CARE_PROVIDER_SITE_OTHER): Payer: Self-pay | Admitting: Orthopaedic Surgery

## 2017-08-20 NOTE — Telephone Encounter (Signed)
Patient called advised the fax# for [facility] is [phone]

## 2017-08-20 NOTE — Telephone Encounter (Signed)
REFAXED ORDER FROM [facility].

## 2017-08-20 NOTE — Telephone Encounter (Signed)
SEE OTHER MESSAGES

## 2017-08-21 ENCOUNTER — Telehealth: Payer: Self-pay | Admitting: Family Medicine

## 2017-08-21 DIAGNOSIS — Z7984 Long term (current) use of oral hypoglycemic drugs: Secondary | ICD-10-CM | POA: Diagnosis not present

## 2017-08-21 DIAGNOSIS — Z89411 Acquired absence of right great toe: Secondary | ICD-10-CM | POA: Diagnosis not present

## 2017-08-21 DIAGNOSIS — G894 Chronic pain syndrome: Secondary | ICD-10-CM | POA: Diagnosis not present

## 2017-08-21 DIAGNOSIS — M86171 Other acute osteomyelitis, right ankle and foot: Secondary | ICD-10-CM | POA: Diagnosis not present

## 2017-08-21 DIAGNOSIS — E1142 Type 2 diabetes mellitus with diabetic polyneuropathy: Secondary | ICD-10-CM | POA: Diagnosis not present

## 2017-08-21 DIAGNOSIS — Z981 Arthrodesis status: Secondary | ICD-10-CM | POA: Diagnosis not present

## 2017-08-21 DIAGNOSIS — G4700 Insomnia, unspecified: Secondary | ICD-10-CM | POA: Diagnosis not present

## 2017-08-21 DIAGNOSIS — E1169 Type 2 diabetes mellitus with other specified complication: Secondary | ICD-10-CM | POA: Diagnosis not present

## 2017-08-21 DIAGNOSIS — L02415 Cutaneous abscess of right lower limb: Secondary | ICD-10-CM | POA: Diagnosis not present

## 2017-08-21 DIAGNOSIS — L03115 Cellulitis of right lower limb: Secondary | ICD-10-CM | POA: Diagnosis not present

## 2017-08-21 DIAGNOSIS — Z4781 Encounter for orthopedic aftercare following surgical amputation: Secondary | ICD-10-CM | POA: Diagnosis not present

## 2017-08-21 DIAGNOSIS — E1140 Type 2 diabetes mellitus with diabetic neuropathy, unspecified: Secondary | ICD-10-CM | POA: Diagnosis not present

## 2017-08-21 DIAGNOSIS — E11621 Type 2 diabetes mellitus with foot ulcer: Secondary | ICD-10-CM | POA: Diagnosis not present

## 2017-08-21 DIAGNOSIS — E11649 Type 2 diabetes mellitus with hypoglycemia without coma: Secondary | ICD-10-CM | POA: Diagnosis not present

## 2017-08-21 DIAGNOSIS — I10 Essential (primary) hypertension: Secondary | ICD-10-CM | POA: Diagnosis not present

## 2017-08-21 DIAGNOSIS — L97511 Non-pressure chronic ulcer of other part of right foot limited to breakdown of skin: Secondary | ICD-10-CM | POA: Diagnosis not present

## 2017-08-21 DIAGNOSIS — L02611 Cutaneous abscess of right foot: Secondary | ICD-10-CM | POA: Diagnosis not present

## 2017-08-21 DIAGNOSIS — Z79891 Long term (current) use of opiate analgesic: Secondary | ICD-10-CM | POA: Diagnosis not present

## 2017-08-21 NOTE — Telephone Encounter (Signed)
Copied from [place] [id number]. Topic: Quick Communication - See Telephone Encounter >> Aug 21, 2017 11:32 AM Conception Chancy, NT wrote: CRM for notification. See Telephone encounter for: 08/21/17.  Charlynn Grimes is a nurse calling [facility] and states she is out evaluating the patient today and needs verbal orders that Dr. Etter Sjogren will be following the patient and once she receives the verbals she will then fax the paper.  [phone]

## 2017-08-22 ENCOUNTER — Encounter (INDEPENDENT_AMBULATORY_CARE_PROVIDER_SITE_OTHER): Payer: Self-pay | Admitting: Orthopaedic Surgery

## 2017-08-22 ENCOUNTER — Telehealth (INDEPENDENT_AMBULATORY_CARE_PROVIDER_SITE_OTHER): Payer: Self-pay | Admitting: Orthopaedic Surgery

## 2017-08-22 ENCOUNTER — Ambulatory Visit (INDEPENDENT_AMBULATORY_CARE_PROVIDER_SITE_OTHER): Payer: Medicare HMO | Admitting: Orthopaedic Surgery

## 2017-08-22 DIAGNOSIS — S91301D Unspecified open wound, right foot, subsequent encounter: Secondary | ICD-10-CM

## 2017-08-22 MED ORDER — DOXYCYCLINE HYCLATE 100 MG PO TABS: 100.0000 mg | ORAL_TABLET | Freq: Two times a day (BID) | ORAL | 0 refills | Status: DC

## 2017-08-22 MED ORDER — COLLAGENASE 250 UNIT/GM EX OINT: 1.0000 "application " | TOPICAL_OINTMENT | Freq: Every day | CUTANEOUS | 2 refills | Status: DC

## 2017-08-22 NOTE — Progress Notes (Signed)
Patient comes in today for a wound check of her right foot.  The medial wound has healed nicely.  She still has redness around her first ray.  There is no purulent drainage.  The plantar ulcer has a stable base of fibrinous exudate.  There is no evidence of worsening.  Overall the wound does appear to be smaller.  At this point I would like to convert her to Santyl ointment twice a day for enzymatic debridement.  Prescription for doxycycline.  Recheck in 2 weeks

## 2017-08-22 NOTE — Telephone Encounter (Signed)
[facility]  [place]  [phone]    Number and size of wound   Day Supply #[id number]   Quantity of med dispense in grams

## 2017-08-25 ENCOUNTER — Telehealth (INDEPENDENT_AMBULATORY_CARE_PROVIDER_SITE_OTHER): Payer: Self-pay | Admitting: Orthopaedic Surgery

## 2017-08-25 NOTE — Telephone Encounter (Signed)
1 cm in diameter. 2 wounds

## 2017-08-25 NOTE — Telephone Encounter (Signed)
We will follow.

## 2017-08-25 NOTE — Telephone Encounter (Signed)
Yes that's correct.  [place]

## 2017-08-25 NOTE — Telephone Encounter (Signed)
Patient called stating that [facility] was needing additional information if you could give Seth Bake a call at [phone] ext [phone]

## 2017-08-25 NOTE — Telephone Encounter (Signed)
What about "Number and size of wound ?" Pharmacist would like to know.

## 2017-08-25 NOTE — Telephone Encounter (Signed)
[facility] from [facility] called to notify her of Dr.

## 2017-08-25 NOTE — Telephone Encounter (Signed)
I assume its regarding Rx "collagenase (SANTYL) ointment " since that is the only one you prescribed. See message below.

## 2017-08-25 NOTE — Telephone Encounter (Signed)
Author phoned [facility]ita from [facility] to relay Dr. Nonda Lou message. Donita appreciative of call.

## 2017-08-26 DIAGNOSIS — Z981 Arthrodesis status: Secondary | ICD-10-CM | POA: Diagnosis not present

## 2017-08-26 DIAGNOSIS — L02415 Cutaneous abscess of right lower limb: Secondary | ICD-10-CM | POA: Diagnosis not present

## 2017-08-26 DIAGNOSIS — Z7984 Long term (current) use of oral hypoglycemic drugs: Secondary | ICD-10-CM | POA: Diagnosis not present

## 2017-08-26 DIAGNOSIS — E11649 Type 2 diabetes mellitus with hypoglycemia without coma: Secondary | ICD-10-CM | POA: Diagnosis not present

## 2017-08-26 DIAGNOSIS — L97511 Non-pressure chronic ulcer of other part of right foot limited to breakdown of skin: Secondary | ICD-10-CM | POA: Diagnosis not present

## 2017-08-26 DIAGNOSIS — I10 Essential (primary) hypertension: Secondary | ICD-10-CM | POA: Diagnosis not present

## 2017-08-26 DIAGNOSIS — E1140 Type 2 diabetes mellitus with diabetic neuropathy, unspecified: Secondary | ICD-10-CM | POA: Diagnosis not present

## 2017-08-26 DIAGNOSIS — L02611 Cutaneous abscess of right foot: Secondary | ICD-10-CM | POA: Diagnosis not present

## 2017-08-26 DIAGNOSIS — E11621 Type 2 diabetes mellitus with foot ulcer: Secondary | ICD-10-CM | POA: Diagnosis not present

## 2017-08-26 DIAGNOSIS — L03115 Cellulitis of right lower limb: Secondary | ICD-10-CM | POA: Diagnosis not present

## 2017-08-27 DIAGNOSIS — L0390 Cellulitis, unspecified: Secondary | ICD-10-CM | POA: Diagnosis not present

## 2017-08-27 DIAGNOSIS — L0291 Cutaneous abscess, unspecified: Secondary | ICD-10-CM | POA: Diagnosis not present

## 2017-08-27 DIAGNOSIS — E11621 Type 2 diabetes mellitus with foot ulcer: Secondary | ICD-10-CM | POA: Diagnosis not present

## 2017-08-27 DIAGNOSIS — G629 Polyneuropathy, unspecified: Secondary | ICD-10-CM | POA: Diagnosis not present

## 2017-08-28 NOTE — Telephone Encounter (Signed)
IC and s/w Merry Proud, they do not need any info at this time.

## 2017-08-28 NOTE — Telephone Encounter (Signed)
[place] and s/w Lana A and advised of this.

## 2017-09-03 ENCOUNTER — Telehealth: Payer: Self-pay | Admitting: *Deleted

## 2017-09-03 DIAGNOSIS — E1140 Type 2 diabetes mellitus with diabetic neuropathy, unspecified: Secondary | ICD-10-CM | POA: Diagnosis not present

## 2017-09-03 DIAGNOSIS — L02611 Cutaneous abscess of right foot: Secondary | ICD-10-CM | POA: Diagnosis not present

## 2017-09-03 DIAGNOSIS — I10 Essential (primary) hypertension: Secondary | ICD-10-CM | POA: Diagnosis not present

## 2017-09-03 DIAGNOSIS — Z981 Arthrodesis status: Secondary | ICD-10-CM | POA: Diagnosis not present

## 2017-09-03 DIAGNOSIS — E11649 Type 2 diabetes mellitus with hypoglycemia without coma: Secondary | ICD-10-CM | POA: Diagnosis not present

## 2017-09-03 DIAGNOSIS — L97511 Non-pressure chronic ulcer of other part of right foot limited to breakdown of skin: Secondary | ICD-10-CM | POA: Diagnosis not present

## 2017-09-03 DIAGNOSIS — L02415 Cutaneous abscess of right lower limb: Secondary | ICD-10-CM | POA: Diagnosis not present

## 2017-09-03 DIAGNOSIS — Z7984 Long term (current) use of oral hypoglycemic drugs: Secondary | ICD-10-CM | POA: Diagnosis not present

## 2017-09-03 DIAGNOSIS — E11621 Type 2 diabetes mellitus with foot ulcer: Secondary | ICD-10-CM | POA: Diagnosis not present

## 2017-09-03 DIAGNOSIS — L03115 Cellulitis of right lower limb: Secondary | ICD-10-CM | POA: Diagnosis not present

## 2017-09-03 NOTE — Telephone Encounter (Signed)
Received Home Health Certification and Plan of Care; forwarded to provider/SLS 05/29

## 2017-09-05 ENCOUNTER — Encounter (INDEPENDENT_AMBULATORY_CARE_PROVIDER_SITE_OTHER): Payer: Self-pay | Admitting: Orthopaedic Surgery

## 2017-09-05 ENCOUNTER — Ambulatory Visit (INDEPENDENT_AMBULATORY_CARE_PROVIDER_SITE_OTHER): Payer: Medicare HMO | Admitting: Orthopaedic Surgery

## 2017-09-05 DIAGNOSIS — S91301D Unspecified open wound, right foot, subsequent encounter: Secondary | ICD-10-CM

## 2017-09-05 NOTE — Progress Notes (Signed)
Office Visit Note   Patient: Brittany [name]           Date of Birth: 1945-10-31           MRN: [id number] Visit Date: 09/05/2017              Requested by: [address], [place], [place] [address] [place], [place] [address] PCP: Carollee Herter, Alferd Apa, DO   Assessment & Plan: Visit Diagnoses:  1. Open wound of right foot, subsequent encounter     Plan: At this point continue with Santyl wet-to-dry ointment to the plantar wound.  Nonweightbearing to the forefoot.  Follow-up in 3 weeks for wound recheck.  Follow-Up Instructions: Return in about 3 weeks (around 09/26/2017).   Orders:  No orders of the defined types were placed in this encounter.  No orders of the defined types were placed in this encounter.     Procedures: No procedures performed   Clinical Data: No additional findings.   Subjective: Chief Complaint  Patient presents with  . Right Foot - Pain    [place] today for a wound check.  She is 26 days status post I&D of the foot.   Review of Systems   Objective: Vital Signs: There were no vitals taken for this visit.  Physical Exam  Ortho Exam The medial wound has completely healed up.  The plantar wound is smaller in diameter.  There is serous drainage but no evidence of infection.  There is local erythema. Specialty Comments:  No specialty comments available.  Imaging: No results found.   PMFS History: Patient Active Problem List   Diagnosis Date Noted  . Open wound of right foot 08/15/2017  . Cellulitis 08/08/2017  . Hypoglycemia without diagnosis of diabetes mellitus 08/08/2017  . Peripheral neuropathy 08/08/2017  . Abscess 08/08/2017  . Hyperlipidemia LDL goal <70 09/22/2016  . Tooth pain 09/03/2012  . Edema 09/03/2012  . Supraclavicular fossa fullness 06/18/2012  . Hair loss 09/18/2010  . Fatigue 09/18/2010  . DYSPEPSIA&OTHER [facility] DISORDERS FUNCTION STOMACH 05/08/2010  . DIARRHEA 05/08/2010  . DIZZINESS 04/19/2010  .  OTHER ACUTE SINUSITIS 02/28/2010  . NAUSEA 02/28/2010  . KNEE PAIN, LEFT 02/15/2010  . URI 07/09/2007  . GERD 02/12/2007  . LOW BACK PAIN 02/12/2007  . Essential hypertension 09/03/2006  . HOT FLASHES 09/03/2006  . INSOMNIA 09/03/2006   Past Medical History:  Diagnosis Date  . Chronic pain    In pain clinic   . Diabetes mellitus   . GERD (gastroesophageal reflux disease)   . Hypertension   . Neuropathy     Family History  Problem Relation Age of Onset  . Cancer Mother        breast  . Depression Mother        bipolar  . Breast cancer Mother 25  . Heart disease Father 70       MI  . Diabetes Brother   . Kidney disease Brother   . Hypertension Brother   . Hyperlipidemia Brother   . Stroke Brother   . Coronary artery disease Unknown   . Diabetes Unknown     Past Surgical History:  Procedure Laterality Date  . I&D EXTREMITY Right 08/10/2017   Procedure: IRRIGATION AND DEBRIDEMENT FOOT;  Surgeon: Leandrew Koyanagi, MD;  Location: [place];  Service: Orthopedics;  Laterality: Right;  . LUMBAR FUSION     Social History   Occupational History  . Not on file  Tobacco  Use  . Smoking status: Never Smoker  . Smokeless tobacco: Never Used  Substance and Sexual Activity  . Alcohol use: No  . Drug use: No  . Sexual activity: Not Currently    Partners: Male

## 2017-09-08 ENCOUNTER — Telehealth: Payer: Self-pay | Admitting: Family Medicine

## 2017-09-08 NOTE — Telephone Encounter (Signed)
Copied from [place] [id number]. Topic: Quick Communication - Rx Refill/Question >> Sep 08, 2017 10:05 AM Neva Seat wrote: Klor-Con M20 ER tab 20 EMQ  sitaGLIPtin-metformin (JANUMET) 50-1000 MG tablet  Requesting 90 day supply refills asap   [place] [place] [address] Phone: [phone] Fax: [phone]

## 2017-09-09 MED ORDER — SITAGLIPTIN PHOS-METFORMIN HCL 50-1000 MG PO TABS: ORAL_TABLET | ORAL | 3 refills | Status: DC

## 2017-09-09 NOTE — Telephone Encounter (Signed)
Request for [place] refill to go to [facility] delivery.  [place]  2 tablets  po at bed time daily.    Last refill:  Ordered.  09/09/17  LOV:05/01/17 with   [place]- Cheri Rous

## 2017-09-11 ENCOUNTER — Telehealth (INDEPENDENT_AMBULATORY_CARE_PROVIDER_SITE_OTHER): Payer: Self-pay | Admitting: Orthopaedic Surgery

## 2017-09-11 DIAGNOSIS — L03115 Cellulitis of right lower limb: Secondary | ICD-10-CM | POA: Diagnosis not present

## 2017-09-11 DIAGNOSIS — I10 Essential (primary) hypertension: Secondary | ICD-10-CM | POA: Diagnosis not present

## 2017-09-11 DIAGNOSIS — L02415 Cutaneous abscess of right lower limb: Secondary | ICD-10-CM | POA: Diagnosis not present

## 2017-09-11 DIAGNOSIS — Z7984 Long term (current) use of oral hypoglycemic drugs: Secondary | ICD-10-CM | POA: Diagnosis not present

## 2017-09-11 DIAGNOSIS — E11649 Type 2 diabetes mellitus with hypoglycemia without coma: Secondary | ICD-10-CM | POA: Diagnosis not present

## 2017-09-11 DIAGNOSIS — L97511 Non-pressure chronic ulcer of other part of right foot limited to breakdown of skin: Secondary | ICD-10-CM | POA: Diagnosis not present

## 2017-09-11 DIAGNOSIS — Z981 Arthrodesis status: Secondary | ICD-10-CM | POA: Diagnosis not present

## 2017-09-11 DIAGNOSIS — E11621 Type 2 diabetes mellitus with foot ulcer: Secondary | ICD-10-CM | POA: Diagnosis not present

## 2017-09-11 DIAGNOSIS — E1140 Type 2 diabetes mellitus with diabetic neuropathy, unspecified: Secondary | ICD-10-CM | POA: Diagnosis not present

## 2017-09-11 DIAGNOSIS — L02611 Cutaneous abscess of right foot: Secondary | ICD-10-CM | POA: Diagnosis not present

## 2017-09-11 NOTE — Telephone Encounter (Signed)
[facility]  [phone]  Wound Care Questions   Pt has new wound on great right toe centimeter to centimeter. Nurse feels like the wound might be due to pressure from shoe.

## 2017-09-11 NOTE — Telephone Encounter (Signed)
SEE MESSAGE

## 2017-09-12 DIAGNOSIS — L0291 Cutaneous abscess, unspecified: Secondary | ICD-10-CM | POA: Diagnosis not present

## 2017-09-12 DIAGNOSIS — G629 Polyneuropathy, unspecified: Secondary | ICD-10-CM | POA: Diagnosis not present

## 2017-09-12 DIAGNOSIS — L0390 Cellulitis, unspecified: Secondary | ICD-10-CM | POA: Diagnosis not present

## 2017-09-12 NOTE — Telephone Encounter (Signed)
Let's have her come in to see [place]

## 2017-09-12 NOTE — Telephone Encounter (Signed)
Called Brittany [name] so that she can be aware & called patient to make appt for Tuesday.

## 2017-09-16 ENCOUNTER — Encounter (INDEPENDENT_AMBULATORY_CARE_PROVIDER_SITE_OTHER): Payer: Self-pay | Admitting: Orthopaedic Surgery

## 2017-09-16 ENCOUNTER — Ambulatory Visit (INDEPENDENT_AMBULATORY_CARE_PROVIDER_SITE_OTHER): Payer: Medicare HMO | Admitting: Orthopaedic Surgery

## 2017-09-16 ENCOUNTER — Ambulatory Visit (HOSPITAL_COMMUNITY)
Admission: RE | Admit: 2017-09-16 | Discharge: 2017-09-16 | Disposition: A | Discharge status: Home / Self Care | Payer: Medicare HMO | Source: Ambulatory Visit | Attending: Orthopaedic Surgery | Admitting: Orthopaedic Surgery

## 2017-09-16 DIAGNOSIS — S91301D Unspecified open wound, right foot, subsequent encounter: Secondary | ICD-10-CM

## 2017-09-16 DIAGNOSIS — M79671 Pain in right foot: Secondary | ICD-10-CM

## 2017-09-16 NOTE — Progress Notes (Signed)
Post-Op Visit Note   Patient: Brittany [name]           Date of Birth: 25-Oct-1945           MRN: [id number] Visit Date: 09/16/2017 PCP: Ann Held, DO   Assessment & Plan:  Chief Complaint:  Chief Complaint  Patient presents with  . Right Foot - Wound Check, Follow-up   Visit Diagnoses:  1. Pain in right foot   2. Open wound of right foot, subsequent encounter     Plan: Patient is a pleasant 72 year old female who presents to our clinic today 37 days status post I&D right foot diabetic ulcer, date of surgery 08/10/2017.  She was initially doing okay and over the past few weeks was packing this with Santyl.  She has been nonweightbearing to the forefoot and a Darco shoe.  No fevers, chills or any other systemic symptoms to include chest pain or shortness of breath.  Over the past few days she has noticed increased swelling and redness to the right leg.  No increased pain.  No calf tenderness.  Examination of her right lower extremity reveals moderate swelling and erythema.  Calf is nontender.  Negative Homans.  Right foot shows the plantar wound which is unchanged in size.  No tenderness and no active drainage. 2 + pulses distally. At this point, we will obtain an MRI with and without contrast of the right foot to assess for cellulitis as well as soft tissue infection.  We will also obtain a stat venous Doppler ultrasound of the right lower extremity.  She will remain nonweightbearing to the forefoot.  Continue with the Santyl and packed dressing changes.  Follow-up in 1 week to discuss MRI results.  Call with concerns or questions in the meantime.  Follow-Up Instructions: Return in about 1 week (around 09/23/2017).   Orders:  Orders Placed This Encounter  Procedures  . MR FOOT RIGHT W CONTRAST   No orders of the defined types were placed in this encounter.   Imaging: No results found.  PMFS History: Patient Active Problem List   Diagnosis Date Noted  . Pain in right  foot 09/16/2017  . Open wound of right foot 08/15/2017  . Cellulitis 08/08/2017  . Hypoglycemia without diagnosis of diabetes mellitus 08/08/2017  . Peripheral neuropathy 08/08/2017  . Abscess 08/08/2017  . Hyperlipidemia LDL goal <70 09/22/2016  . Tooth pain 09/03/2012  . Edema 09/03/2012  . Supraclavicular fossa fullness 06/18/2012  . Hair loss 09/18/2010  . Fatigue 09/18/2010  . DYSPEPSIA&OTHER [facility] DISORDERS FUNCTION STOMACH 05/08/2010  . DIARRHEA 05/08/2010  . DIZZINESS 04/19/2010  . OTHER ACUTE SINUSITIS 02/28/2010  . NAUSEA 02/28/2010  . KNEE PAIN, LEFT 02/15/2010  . URI 07/09/2007  . GERD 02/12/2007  . LOW BACK PAIN 02/12/2007  . Essential hypertension 09/03/2006  . HOT FLASHES 09/03/2006  . INSOMNIA 09/03/2006   Past Medical History:  Diagnosis Date  . Chronic pain    In pain clinic   . Diabetes mellitus   . GERD (gastroesophageal reflux disease)   . Hypertension   . Neuropathy     Family History  Problem Relation Age of Onset  . Cancer Mother        breast  . Depression Mother        bipolar  . Breast cancer Mother 53  . Heart disease Father 20       MI  . Diabetes Brother   . Kidney disease Brother   .  Hypertension Brother   . Hyperlipidemia Brother   . Stroke Brother   . Coronary artery disease Unknown   . Diabetes Unknown     Past Surgical History:  Procedure Laterality Date  . I&D EXTREMITY Right 08/10/2017   Procedure: IRRIGATION AND DEBRIDEMENT FOOT;  Surgeon: Leandrew Koyanagi, MD;  Location: [place];  Service: Orthopedics;  Laterality: Right;  . LUMBAR FUSION     Social History   Occupational History  . Not on file  Tobacco Use  . Smoking status: Never Smoker  . Smokeless tobacco: Never Used  Substance and Sexual Activity  . Alcohol use: No  . Drug use: No  . Sexual activity: Not Currently    Partners: Male

## 2017-09-16 NOTE — Progress Notes (Addendum)
Right lower extremity venous duplex has been completed. Negative for obvious evidence DVT. Results were given to Dr. Erlinda Hong.  09/16/17 3:34 PM Brittany [name] RVT

## 2017-09-17 ENCOUNTER — Other Ambulatory Visit (INDEPENDENT_AMBULATORY_CARE_PROVIDER_SITE_OTHER): Payer: Self-pay

## 2017-09-17 ENCOUNTER — Telehealth (INDEPENDENT_AMBULATORY_CARE_PROVIDER_SITE_OTHER): Payer: Self-pay | Admitting: Orthopaedic Surgery

## 2017-09-17 MED ORDER — DOXYCYCLINE HYCLATE 100 MG PO TABS: ORAL_TABLET | ORAL | 0 refills | Status: DC

## 2017-09-17 NOTE — Telephone Encounter (Signed)
Yes please.  Sorry about that 100 mg bid x 10 days

## 2017-09-17 NOTE — Telephone Encounter (Signed)
Patient said when she went to her pharmacy to pick up her doxycycline it hadn't been sent in, could you resend it please to the [facility]. CB # [phone] please call when called in

## 2017-09-17 NOTE — Telephone Encounter (Signed)
PATIENT AWARE

## 2017-09-17 NOTE — Telephone Encounter (Signed)
CALLED INTO PHARM  

## 2017-09-17 NOTE — Telephone Encounter (Signed)
Were you going to send in Rx of Doxycycline?

## 2017-09-21 ENCOUNTER — Ambulatory Visit
Admission: RE | Admit: 2017-09-21 | Discharge: 2017-09-21 | Disposition: A | Discharge status: Home / Self Care | Payer: Medicare HMO | Source: Ambulatory Visit | Attending: Orthopaedic Surgery | Admitting: Orthopaedic Surgery

## 2017-09-21 DIAGNOSIS — M79671 Pain in right foot: Secondary | ICD-10-CM

## 2017-09-21 DIAGNOSIS — L97519 Non-pressure chronic ulcer of other part of right foot with unspecified severity: Secondary | ICD-10-CM | POA: Diagnosis not present

## 2017-09-21 MED ORDER — GADOBENATE DIMEGLUMINE 529 MG/ML IV SOLN
15.0000 mL | Freq: Once | INTRAVENOUS | Status: AC | PRN
  Administered 2017-09-21: 15 mL via INTRAVENOUS

## 2017-09-22 ENCOUNTER — Other Ambulatory Visit: Payer: Medicare HMO

## 2017-09-25 ENCOUNTER — Ambulatory Visit (INDEPENDENT_AMBULATORY_CARE_PROVIDER_SITE_OTHER): Payer: Medicare HMO | Admitting: Orthopaedic Surgery

## 2017-09-25 ENCOUNTER — Encounter (INDEPENDENT_AMBULATORY_CARE_PROVIDER_SITE_OTHER): Payer: Self-pay | Admitting: Orthopaedic Surgery

## 2017-09-25 DIAGNOSIS — M869 Osteomyelitis, unspecified: Secondary | ICD-10-CM | POA: Insufficient documentation

## 2017-09-25 DIAGNOSIS — S91301D Unspecified open wound, right foot, subsequent encounter: Secondary | ICD-10-CM

## 2017-09-25 NOTE — Progress Notes (Signed)
Office Visit Note   Patient: Brittany [name]           Date of Birth: [birth year]           MRN: [id number] Visit Date: 09/25/2017              Requested by: [address], [place], [place] [address] [place], [place] [address] PCP: Carollee Herter, Alferd Apa, DO   Assessment & Plan: Visit Diagnoses:  1. Osteomyelitis of great toe of right foot ([place])   2. Open wound of right foot, subsequent encounter     Plan: Impression on the MRI is great toe proximal phalanx osteomyelitis.  At this point recommendation is for partial ray resection and adjacent tissue rearrangement.  This was discussed with the patient along with the risks and benefits.  Patient understands wished to proceed.  Follow-Up Instructions: Return if symptoms worsen or fail to improve.   Orders:  No orders of the defined types were placed in this encounter.  No orders of the defined types were placed in this encounter.     Procedures: No procedures performed   Clinical Data: No additional findings.   Subjective: Chief Complaint  Patient presents with  . Right Foot - Pain, Follow-up    Patient is here for MRI review of her right foot.   Review of Systems  Constitutional: Negative.   HENT: Negative.   Eyes: Negative.   Respiratory: Negative.   Cardiovascular: Negative.   Endocrine: Negative.   Musculoskeletal: Negative.   Neurological: Negative.   Hematological: Negative.   Psychiatric/Behavioral: Negative.   All other systems reviewed and are negative.    Objective: Vital Signs: There were no vitals taken for this visit.  Physical Exam  Constitutional: She is oriented to person, place, and time. She appears well-developed and well-nourished.  Pulmonary/Chest: Effort normal.  Neurological: She is alert and oriented to person, place, and time.  Skin: Skin is warm. Capillary refill takes less than 2 seconds.  Psychiatric: She has a normal mood and affect. Her behavior is normal.  Judgment and thought content normal.  Nursing note and vitals reviewed.   Ortho Exam Right foot exam shows swelling and cellulitis and drainage Specialty Comments:  No specialty comments available.  Imaging: No results found.   PMFS History: Patient Active Problem List   Diagnosis Date Noted  . Osteomyelitis of great toe of right foot ([place]) 09/25/2017  . Pain in right foot 09/16/2017  . Open wound of right foot 08/15/2017  . Cellulitis 08/08/2017  . Hypoglycemia without diagnosis of diabetes mellitus 08/08/2017  . Peripheral neuropathy 08/08/2017  . Abscess 08/08/2017  . Hyperlipidemia LDL goal <70 09/22/2016  . Tooth pain 09/03/2012  . Edema 09/03/2012  . Supraclavicular fossa fullness 06/18/2012  . Hair loss 09/18/2010  . Fatigue 09/18/2010  . DYSPEPSIA&OTHER [facility] DISORDERS FUNCTION STOMACH 05/08/2010  . DIARRHEA 05/08/2010  . DIZZINESS 04/19/2010  . OTHER ACUTE SINUSITIS 02/28/2010  . NAUSEA 02/28/2010  . KNEE PAIN, LEFT 02/15/2010  . URI 07/09/2007  . GERD 02/12/2007  . LOW BACK PAIN 02/12/2007  . Essential hypertension 09/03/2006  . HOT FLASHES 09/03/2006  . INSOMNIA 09/03/2006   Past Medical History:  Diagnosis Date  . Chronic pain    In pain clinic   . Diabetes mellitus   . GERD (gastroesophageal reflux disease)   . Hypertension   . Neuropathy     Family History  Problem Relation Age of Onset  . Cancer  Mother        breast  . Depression Mother        bipolar  . Breast cancer Mother 47  . Heart disease Father 20       MI  . Diabetes Brother   . Kidney disease Brother   . Hypertension Brother   . Hyperlipidemia Brother   . Stroke Brother   . Coronary artery disease Unknown   . Diabetes Unknown     Past Surgical History:  Procedure Laterality Date  . I&D EXTREMITY Right 08/10/2017   Procedure: IRRIGATION AND DEBRIDEMENT FOOT;  Surgeon: Leandrew Koyanagi, MD;  Location: [place];  Service: Orthopedics;  Laterality: Right;  . LUMBAR FUSION     Social  History   Occupational History  . Not on file  Tobacco Use  . Smoking status: Never Smoker  . Smokeless tobacco: Never Used  Substance and Sexual Activity  . Alcohol use: No  . Drug use: No  . Sexual activity: Not Currently    Partners: Male

## 2017-09-26 ENCOUNTER — Other Ambulatory Visit: Payer: Self-pay

## 2017-09-26 ENCOUNTER — Telehealth (INDEPENDENT_AMBULATORY_CARE_PROVIDER_SITE_OTHER): Payer: Self-pay | Admitting: Orthopaedic Surgery

## 2017-09-26 ENCOUNTER — Encounter (HOSPITAL_COMMUNITY): Payer: Self-pay | Admitting: *Deleted

## 2017-09-26 NOTE — Telephone Encounter (Signed)
Yes

## 2017-09-26 NOTE — Telephone Encounter (Signed)
IC and advised.  

## 2017-09-26 NOTE — Telephone Encounter (Signed)
Please advise 

## 2017-09-26 NOTE — Telephone Encounter (Signed)
Patient would like to know If she is going to be in the hospital over night for her surgery on 6/24. Please advise # [phone]

## 2017-09-26 NOTE — Progress Notes (Signed)
Spoke with pt for pre-op call. Pt denies cardiac history, chest pain or sob. Pt is a type 2 diabetic. Last A1C was 6.2 on 08/08/17. Pt states her fasting blood sugar is usually between 85-97. Pt instructed not to take her Janumet Sunday PM or Monday AM. Instructed pt to check her blood sugar Monday AM when she gets up and every 2 hours until she leaves for the hospital. If blood sugar is 70 or below, treat with 1/2 cup of clear juice (apple or cranberry) and recheck blood sugar 15 minutes after drinking juice. If blood sugar continues to be 70 or below, call the Short Stay department and ask to speak to a nurse. Pt voiced understanding. Pt's surgery is not until 3:40 PM, she was concerned about not eating or drinking for such a long period of time. I spoke with Dr. Therisa Doyne, Anesthesiologist and he stated she could have clear liquids until 9 AM. I called pt back and gave her those instructions. I also told her that if she gets a call that surgery is moved to an earlier time, then she would need not to eat or drink after midnight, Sunday. She voiced understanding.

## 2017-09-29 ENCOUNTER — Ambulatory Visit (HOSPITAL_COMMUNITY): Payer: Medicare HMO | Admitting: Anesthesiology

## 2017-09-29 ENCOUNTER — Other Ambulatory Visit: Payer: Self-pay

## 2017-09-29 ENCOUNTER — Encounter (HOSPITAL_COMMUNITY)
Admission: AD | Disposition: A | Discharge status: Home Health | Payer: Self-pay | Source: Home / Self Care | Attending: Orthopaedic Surgery

## 2017-09-29 ENCOUNTER — Inpatient Hospital Stay (HOSPITAL_COMMUNITY)
Admission: AD | Admit: 2017-09-29 | Discharge: 2017-10-02 | DRG: 505 | Disposition: A | Discharge status: Home Health | Payer: Medicare HMO | Attending: Orthopaedic Surgery | Admitting: Orthopaedic Surgery

## 2017-09-29 ENCOUNTER — Encounter (HOSPITAL_COMMUNITY): Payer: Self-pay

## 2017-09-29 DIAGNOSIS — K219 Gastro-esophageal reflux disease without esophagitis: Secondary | ICD-10-CM | POA: Diagnosis present

## 2017-09-29 DIAGNOSIS — E1169 Type 2 diabetes mellitus with other specified complication: Secondary | ICD-10-CM | POA: Diagnosis not present

## 2017-09-29 DIAGNOSIS — Z7984 Long term (current) use of oral hypoglycemic drugs: Secondary | ICD-10-CM

## 2017-09-29 DIAGNOSIS — Z841 Family history of disorders of kidney and ureter: Secondary | ICD-10-CM | POA: Diagnosis not present

## 2017-09-29 DIAGNOSIS — Z981 Arthrodesis status: Secondary | ICD-10-CM

## 2017-09-29 DIAGNOSIS — L97519 Non-pressure chronic ulcer of other part of right foot with unspecified severity: Secondary | ICD-10-CM | POA: Diagnosis present

## 2017-09-29 DIAGNOSIS — M1990 Unspecified osteoarthritis, unspecified site: Secondary | ICD-10-CM | POA: Diagnosis not present

## 2017-09-29 DIAGNOSIS — I10 Essential (primary) hypertension: Secondary | ICD-10-CM | POA: Diagnosis present

## 2017-09-29 DIAGNOSIS — G8929 Other chronic pain: Secondary | ICD-10-CM | POA: Diagnosis present

## 2017-09-29 DIAGNOSIS — Z882 Allergy status to sulfonamides status: Secondary | ICD-10-CM | POA: Diagnosis not present

## 2017-09-29 DIAGNOSIS — Z823 Family history of stroke: Secondary | ICD-10-CM | POA: Diagnosis not present

## 2017-09-29 DIAGNOSIS — Z818 Family history of other mental and behavioral disorders: Secondary | ICD-10-CM | POA: Diagnosis not present

## 2017-09-29 DIAGNOSIS — R69 Illness, unspecified: Secondary | ICD-10-CM | POA: Diagnosis not present

## 2017-09-29 DIAGNOSIS — Z7982 Long term (current) use of aspirin: Secondary | ICD-10-CM | POA: Diagnosis not present

## 2017-09-29 DIAGNOSIS — L0291 Cutaneous abscess, unspecified: Secondary | ICD-10-CM | POA: Diagnosis not present

## 2017-09-29 DIAGNOSIS — Z803 Family history of malignant neoplasm of breast: Secondary | ICD-10-CM | POA: Diagnosis not present

## 2017-09-29 DIAGNOSIS — E11621 Type 2 diabetes mellitus with foot ulcer: Secondary | ICD-10-CM | POA: Diagnosis present

## 2017-09-29 DIAGNOSIS — L0390 Cellulitis, unspecified: Secondary | ICD-10-CM | POA: Diagnosis not present

## 2017-09-29 DIAGNOSIS — Z833 Family history of diabetes mellitus: Secondary | ICD-10-CM | POA: Diagnosis not present

## 2017-09-29 DIAGNOSIS — E1142 Type 2 diabetes mellitus with diabetic polyneuropathy: Secondary | ICD-10-CM | POA: Diagnosis not present

## 2017-09-29 DIAGNOSIS — E7800 Pure hypercholesterolemia, unspecified: Secondary | ICD-10-CM | POA: Diagnosis present

## 2017-09-29 DIAGNOSIS — Z79899 Other long term (current) drug therapy: Secondary | ICD-10-CM

## 2017-09-29 DIAGNOSIS — Z8349 Family history of other endocrine, nutritional and metabolic diseases: Secondary | ICD-10-CM | POA: Diagnosis not present

## 2017-09-29 DIAGNOSIS — M86171 Other acute osteomyelitis, right ankle and foot: Secondary | ICD-10-CM | POA: Diagnosis not present

## 2017-09-29 DIAGNOSIS — Z90710 Acquired absence of both cervix and uterus: Secondary | ICD-10-CM | POA: Diagnosis not present

## 2017-09-29 DIAGNOSIS — G629 Polyneuropathy, unspecified: Secondary | ICD-10-CM | POA: Diagnosis not present

## 2017-09-29 DIAGNOSIS — Z8249 Family history of ischemic heart disease and other diseases of the circulatory system: Secondary | ICD-10-CM | POA: Diagnosis not present

## 2017-09-29 DIAGNOSIS — M869 Osteomyelitis, unspecified: Secondary | ICD-10-CM | POA: Diagnosis not present

## 2017-09-29 DIAGNOSIS — R309 Painful micturition, unspecified: Secondary | ICD-10-CM | POA: Diagnosis not present

## 2017-09-29 DIAGNOSIS — T8189XA Other complications of procedures, not elsewhere classified, initial encounter: Secondary | ICD-10-CM | POA: Diagnosis not present

## 2017-09-29 HISTORY — PX: APPLICATION OF WOUND VAC: SHX5189

## 2017-09-29 HISTORY — DX: Unspecified osteoarthritis, unspecified site: M19.90

## 2017-09-29 HISTORY — DX: Personal history of other diseases of the digestive system: Z87.19

## 2017-09-29 HISTORY — DX: Type 2 diabetes mellitus without complications: E11.9

## 2017-09-29 HISTORY — DX: Anemia, unspecified: D64.9

## 2017-09-29 HISTORY — PX: AMPUTATION: SHX166

## 2017-09-29 HISTORY — DX: Type 2 diabetes mellitus with diabetic polyneuropathy: E11.42

## 2017-09-29 HISTORY — DX: Pure hypercholesterolemia, unspecified: E78.00

## 2017-09-29 LAB — CBC
HEMATOCRIT: 36.3 % (ref 36.0–46.0)
HEMOGLOBIN: 11.4 g/dL — AB (ref 12.0–15.0)
MCH: 27.3 pg (ref 26.0–34.0)
MCHC: 31.4 g/dL (ref 30.0–36.0)
MCV: 87.1 fL (ref 78.0–100.0)
Platelets: 243 10*3/uL (ref 150–400)
RBC: 4.17 MIL/uL (ref 3.87–5.11)
RDW: 13.2 % (ref 11.5–15.5)
WBC: 10.4 10*3/uL (ref 4.0–10.5)

## 2017-09-29 LAB — BASIC METABOLIC PANEL
Anion gap: 10 (ref 5–15)
BUN: 15 mg/dL (ref 6–20)
CHLORIDE: 99 mmol/L — AB (ref 101–111)
CO2: 31 mmol/L (ref 22–32)
Calcium: 9.3 mg/dL (ref 8.9–10.3)
Creatinine, Ser: 0.75 mg/dL (ref 0.44–1.00)
GFR calc non Af Amer: >60 mL/min (ref >60)
Glucose, Bld: 96 mg/dL (ref 65–99)
POTASSIUM: 3.6 mmol/L (ref 3.5–5.1)
SODIUM: 140 mmol/L (ref 135–145)

## 2017-09-29 LAB — GLUCOSE, CAPILLARY
GLUCOSE-CAPILLARY: 96 mg/dL (ref 65–99)
GLUCOSE-CAPILLARY: 98 mg/dL (ref 65–99)
GLUCOSE-CAPILLARY: 100 mg/dL — AB (ref 65–99)
Glucose-Capillary: 142 mg/dL — ABNORMAL HIGH (ref 65–99)
Glucose-Capillary: 187 mg/dL — ABNORMAL HIGH (ref 65–99)

## 2017-09-29 SURGERY — AMPUTATION, FOOT, RAY
Anesthesia: General | Laterality: Right

## 2017-09-29 MED ORDER — BUPIVACAINE-EPINEPHRINE (PF) 0.25% -1:200000 IJ SOLN
INTRAMUSCULAR | Status: AC
  Filled 2017-09-29: qty 30

## 2017-09-29 MED ORDER — MEPERIDINE HCL 50 MG/ML IJ SOLN: 6.2500 mg | INTRAMUSCULAR | Status: DC | PRN

## 2017-09-29 MED ORDER — OXYCODONE-ACETAMINOPHEN 5-325 MG PO TABS: 1.0000 | ORAL_TABLET | ORAL | 0 refills | Status: DC | PRN

## 2017-09-29 MED ORDER — METHOCARBAMOL 1000 MG/10ML IJ SOLN
500.0000 mg | Freq: Four times a day (QID) | INTRAMUSCULAR | Status: DC | PRN
  Filled 2017-09-29: qty 5

## 2017-09-29 MED ORDER — CHLORHEXIDINE GLUCONATE 4 % EX LIQD: 60.0000 mL | Freq: Once | CUTANEOUS | Status: DC

## 2017-09-29 MED ORDER — CEFAZOLIN SODIUM-DEXTROSE 2-4 GM/100ML-% IV SOLN
2.0000 g | INTRAVENOUS | Status: AC
  Administered 2017-09-29: 2 g via INTRAVENOUS
  Filled 2017-09-29: qty 100

## 2017-09-29 MED ORDER — LIDOCAINE 2% (20 MG/ML) 5 ML SYRINGE
INTRAMUSCULAR | Status: DC | PRN
  Administered 2017-09-29: 30 mg via INTRAVENOUS

## 2017-09-29 MED ORDER — ONDANSETRON HCL 4 MG/2ML IJ SOLN: 4.0000 mg | Freq: Four times a day (QID) | INTRAMUSCULAR | Status: DC | PRN

## 2017-09-29 MED ORDER — METOCLOPRAMIDE HCL 5 MG PO TABS: 5.0000 mg | ORAL_TABLET | Freq: Three times a day (TID) | ORAL | Status: DC | PRN

## 2017-09-29 MED ORDER — INSULIN ASPART 100 UNIT/ML ~~LOC~~ SOLN
0.0000 [IU] | Freq: Three times a day (TID) | SUBCUTANEOUS | Status: DC
  Administered 2017-09-30: 2 [IU] via SUBCUTANEOUS
  Administered 2017-09-30: 3 [IU] via SUBCUTANEOUS
  Administered 2017-09-30: 2 [IU] via SUBCUTANEOUS

## 2017-09-29 MED ORDER — FENTANYL CITRATE (PF) 250 MCG/5ML IJ SOLN
INTRAMUSCULAR | Status: AC
  Filled 2017-09-29: qty 5

## 2017-09-29 MED ORDER — PANTOPRAZOLE SODIUM 40 MG PO TBEC
40.0000 mg | DELAYED_RELEASE_TABLET | Freq: Every day | ORAL | Status: DC
  Administered 2017-09-29 – 2017-10-02 (×4): 40 mg via ORAL
  Filled 2017-09-29 (×4): qty 1

## 2017-09-29 MED ORDER — ATORVASTATIN CALCIUM 10 MG PO TABS
10.0000 mg | ORAL_TABLET | Freq: Every day | ORAL | Status: DC
  Administered 2017-09-29 – 2017-10-02 (×4): 10 mg via ORAL
  Filled 2017-09-29 (×4): qty 1

## 2017-09-29 MED ORDER — BUPIVACAINE-EPINEPHRINE (PF) 0.5% -1:200000 IJ SOLN
INTRAMUSCULAR | Status: AC
  Filled 2017-09-29: qty 30

## 2017-09-29 MED ORDER — HYDROMORPHONE HCL 1 MG/ML IJ SOLN: 0.2500 mg | INTRAMUSCULAR | Status: DC | PRN

## 2017-09-29 MED ORDER — FENTANYL CITRATE (PF) 250 MCG/5ML IJ SOLN
INTRAMUSCULAR | Status: DC | PRN
  Administered 2017-09-29: 50 ug via INTRAVENOUS

## 2017-09-29 MED ORDER — GLYCOPYRROLATE PF 0.2 MG/ML IJ SOSY
PREFILLED_SYRINGE | INTRAMUSCULAR | Status: AC
  Filled 2017-09-29: qty 1

## 2017-09-29 MED ORDER — FERROUS SULFATE 325 (65 FE) MG PO TABS
325.0000 mg | ORAL_TABLET | Freq: Every day | ORAL | Status: DC
  Administered 2017-09-30 – 2017-10-02 (×3): 325 mg via ORAL
  Filled 2017-09-29 (×3): qty 1

## 2017-09-29 MED ORDER — LORATADINE 10 MG PO TABS: 10.0000 mg | ORAL_TABLET | Freq: Every day | ORAL | Status: DC | PRN

## 2017-09-29 MED ORDER — ASPIRIN EC 81 MG PO TBEC
81.0000 mg | DELAYED_RELEASE_TABLET | Freq: Every day | ORAL | Status: DC
  Administered 2017-09-29 – 2017-10-01 (×3): 81 mg via ORAL
  Filled 2017-09-29 (×3): qty 1

## 2017-09-29 MED ORDER — 0.9 % SODIUM CHLORIDE (POUR BTL) OPTIME
TOPICAL | Status: DC | PRN
  Administered 2017-09-29 (×3): 1000 mL

## 2017-09-29 MED ORDER — METHOCARBAMOL 500 MG PO TABS: 500.0000 mg | ORAL_TABLET | Freq: Four times a day (QID) | ORAL | Status: DC | PRN

## 2017-09-29 MED ORDER — VANCOMYCIN HCL 1000 MG IV SOLR
INTRAVENOUS | Status: DC | PRN
  Administered 2017-09-29: 1000 mg via TOPICAL

## 2017-09-29 MED ORDER — GLYCOPYRROLATE 0.2 MG/ML IJ SOLN
INTRAMUSCULAR | Status: DC | PRN
  Administered 2017-09-29: 0.2 mg via INTRAVENOUS

## 2017-09-29 MED ORDER — KETAMINE HCL 10 MG/ML IJ SOLN
INTRAMUSCULAR | Status: AC
  Filled 2017-09-29: qty 1

## 2017-09-29 MED ORDER — ESTRADIOL 1 MG PO TABS
1.0000 mg | ORAL_TABLET | Freq: Every day | ORAL | Status: DC
  Administered 2017-09-30 – 2017-10-02 (×3): 1 mg via ORAL
  Filled 2017-09-29 (×3): qty 1

## 2017-09-29 MED ORDER — CEFAZOLIN SODIUM-DEXTROSE 2-4 GM/100ML-% IV SOLN
2.0000 g | Freq: Four times a day (QID) | INTRAVENOUS | Status: AC
  Administered 2017-09-29 – 2017-09-30 (×2): 2 g via INTRAVENOUS
  Filled 2017-09-29 (×2): qty 100

## 2017-09-29 MED ORDER — DIPHENHYDRAMINE HCL 50 MG/ML IJ SOLN
INTRAMUSCULAR | Status: AC
  Filled 2017-09-29: qty 1

## 2017-09-29 MED ORDER — PROMETHAZINE HCL 25 MG/ML IJ SOLN: 6.2500 mg | INTRAMUSCULAR | Status: DC | PRN

## 2017-09-29 MED ORDER — MIDAZOLAM HCL 2 MG/2ML IJ SOLN
INTRAMUSCULAR | Status: DC | PRN
  Administered 2017-09-29: 2 mg via INTRAVENOUS

## 2017-09-29 MED ORDER — DEXAMETHASONE SODIUM PHOSPHATE 10 MG/ML IJ SOLN
INTRAMUSCULAR | Status: AC
  Filled 2017-09-29: qty 1

## 2017-09-29 MED ORDER — BISOPROLOL-HYDROCHLOROTHIAZIDE 10-6.25 MG PO TABS
1.0000 | ORAL_TABLET | Freq: Every day | ORAL | Status: DC
  Administered 2017-09-30 – 2017-10-02 (×3): 1 via ORAL
  Filled 2017-09-29 (×3): qty 1

## 2017-09-29 MED ORDER — DOCUSATE SODIUM 100 MG PO CAPS
100.0000 mg | ORAL_CAPSULE | Freq: Two times a day (BID) | ORAL | Status: DC
  Administered 2017-09-29 – 2017-10-02 (×6): 100 mg via ORAL
  Filled 2017-09-29 (×6): qty 1

## 2017-09-29 MED ORDER — CEFAZOLIN SODIUM-DEXTROSE 2-4 GM/100ML-% IV SOLN
2.0000 g | Freq: Four times a day (QID) | INTRAVENOUS | Status: DC
  Filled 2017-09-29: qty 100

## 2017-09-29 MED ORDER — PROPOFOL 10 MG/ML IV BOLUS
INTRAVENOUS | Status: DC | PRN
  Administered 2017-09-29: 120 mg via INTRAVENOUS

## 2017-09-29 MED ORDER — METOCLOPRAMIDE HCL 5 MG/ML IJ SOLN: 5.0000 mg | Freq: Three times a day (TID) | INTRAMUSCULAR | Status: DC | PRN

## 2017-09-29 MED ORDER — INSULIN ASPART 100 UNIT/ML ~~LOC~~ SOLN: 0.0000 [IU] | Freq: Every day | SUBCUTANEOUS | Status: DC

## 2017-09-29 MED ORDER — ONDANSETRON HCL 4 MG/2ML IJ SOLN
INTRAMUSCULAR | Status: DC | PRN
  Administered 2017-09-29: 4 mg via INTRAVENOUS

## 2017-09-29 MED ORDER — PROPOFOL 10 MG/ML IV BOLUS
INTRAVENOUS | Status: AC
  Filled 2017-09-29: qty 20

## 2017-09-29 MED ORDER — DIPHENHYDRAMINE HCL 12.5 MG/5ML PO ELIX: 25.0000 mg | ORAL_SOLUTION | ORAL | Status: DC | PRN

## 2017-09-29 MED ORDER — POLYETHYLENE GLYCOL 3350 17 G PO PACK: 17.0000 g | PACK | Freq: Every day | ORAL | Status: DC | PRN

## 2017-09-29 MED ORDER — POTASSIUM CHLORIDE CRYS ER 20 MEQ PO TBCR
20.0000 meq | EXTENDED_RELEASE_TABLET | Freq: Every day | ORAL | Status: DC
  Administered 2017-09-30 – 2017-10-02 (×3): 20 meq via ORAL
  Filled 2017-09-29 (×3): qty 1

## 2017-09-29 MED ORDER — LIDOCAINE 2% (20 MG/ML) 5 ML SYRINGE
INTRAMUSCULAR | Status: AC
  Filled 2017-09-29: qty 5

## 2017-09-29 MED ORDER — LACTATED RINGERS IV SOLN
INTRAVENOUS | Status: DC | PRN
  Administered 2017-09-29: 16:00:00 via INTRAVENOUS

## 2017-09-29 MED ORDER — ESCITALOPRAM OXALATE 10 MG PO TABS
10.0000 mg | ORAL_TABLET | Freq: Every day | ORAL | Status: DC
  Administered 2017-09-29 – 2017-10-02 (×4): 10 mg via ORAL
  Filled 2017-09-29 (×4): qty 1

## 2017-09-29 MED ORDER — ONDANSETRON HCL 4 MG PO TABS: 4.0000 mg | ORAL_TABLET | Freq: Four times a day (QID) | ORAL | Status: DC | PRN

## 2017-09-29 MED ORDER — MIDAZOLAM HCL 2 MG/2ML IJ SOLN: 0.5000 mg | Freq: Once | INTRAMUSCULAR | Status: DC | PRN

## 2017-09-29 MED ORDER — AMLODIPINE BESYLATE 5 MG PO TABS
5.0000 mg | ORAL_TABLET | Freq: Every day | ORAL | Status: DC
  Administered 2017-09-30 – 2017-10-02 (×3): 5 mg via ORAL
  Filled 2017-09-29 (×3): qty 1

## 2017-09-29 MED ORDER — DIPHENHYDRAMINE HCL 50 MG/ML IJ SOLN
INTRAMUSCULAR | Status: DC | PRN
  Administered 2017-09-29: 12.5 mg via INTRAVENOUS

## 2017-09-29 MED ORDER — DEXAMETHASONE SODIUM PHOSPHATE 10 MG/ML IJ SOLN
INTRAMUSCULAR | Status: DC | PRN
  Administered 2017-09-29: 10 mg via INTRAVENOUS

## 2017-09-29 MED ORDER — FOLIC ACID 1 MG PO TABS
1.0000 mg | ORAL_TABLET | Freq: Every day | ORAL | Status: DC
  Administered 2017-09-30 – 2017-10-02 (×3): 1 mg via ORAL
  Filled 2017-09-29 (×3): qty 1

## 2017-09-29 MED ORDER — SORBITOL 70 % SOLN: 30.0000 mL | Freq: Every day | Status: DC | PRN

## 2017-09-29 MED ORDER — MAGNESIUM CITRATE PO SOLN: 1.0000 | Freq: Once | ORAL | Status: DC | PRN

## 2017-09-29 MED ORDER — KETAMINE HCL 10 MG/ML IJ SOLN
INTRAMUSCULAR | Status: DC | PRN
  Administered 2017-09-29: 40 mg via INTRAVENOUS

## 2017-09-29 MED ORDER — KETAMINE HCL 100 MG/ML IJ SOLN
INTRAMUSCULAR | Status: AC
  Filled 2017-09-29: qty 1

## 2017-09-29 MED ORDER — VANCOMYCIN HCL 1000 MG IV SOLR
INTRAVENOUS | Status: AC
  Filled 2017-09-29: qty 1000

## 2017-09-29 MED ORDER — GABAPENTIN 400 MG PO CAPS
800.0000 mg | ORAL_CAPSULE | Freq: Four times a day (QID) | ORAL | Status: DC
  Administered 2017-09-29 – 2017-10-02 (×10): 800 mg via ORAL
  Filled 2017-09-29 (×10): qty 2

## 2017-09-29 MED ORDER — SODIUM CHLORIDE 0.9 % IV SOLN: INTRAVENOUS | Status: DC

## 2017-09-29 MED ORDER — LACTATED RINGERS IV SOLN: INTRAVENOUS | Status: DC

## 2017-09-29 MED ORDER — MORPHINE SULFATE ER 30 MG PO TBCR
60.0000 mg | EXTENDED_RELEASE_TABLET | Freq: Three times a day (TID) | ORAL | Status: DC
  Administered 2017-09-29 – 2017-10-02 (×8): 60 mg via ORAL
  Filled 2017-09-29 (×8): qty 2

## 2017-09-29 MED ORDER — EPHEDRINE SULFATE-NACL 50-0.9 MG/10ML-% IV SOSY
PREFILLED_SYRINGE | INTRAVENOUS | Status: DC | PRN
  Administered 2017-09-29: 10 mg via INTRAVENOUS
  Administered 2017-09-29: 15 mg via INTRAVENOUS

## 2017-09-29 MED ORDER — MIDAZOLAM HCL 2 MG/2ML IJ SOLN
INTRAMUSCULAR | Status: AC
  Filled 2017-09-29: qty 2

## 2017-09-29 SURGICAL SUPPLY — 43 items
BANDAGE GAUZE 4  KLING STR (GAUZE/BANDAGES/DRESSINGS) ×2 IMPLANT
BLADE AVERAGE 25X9 (BLADE) ×2 IMPLANT
COVER LIGHT HANDLE STERIS (MISCELLANEOUS) ×2 IMPLANT
COVER SURGICAL LIGHT HANDLE (MISCELLANEOUS) ×2 IMPLANT
CUFF TOURNIQUET SINGLE 24IN (TOURNIQUET CUFF) ×2 IMPLANT
DRAPE INCISE IOBAN 66X45 STRL (DRAPES) ×1 IMPLANT
DRSG EMULSION OIL 3X3 NADH (GAUZE/BANDAGES/DRESSINGS) ×2 IMPLANT
DRSG VAC ATS SM SENSATRAC (GAUZE/BANDAGES/DRESSINGS) ×1 IMPLANT
DURAPREP 26ML APPLICATOR (WOUND CARE) ×2 IMPLANT
ELECT CAUTERY BLADE 6.4 (BLADE) IMPLANT
ELECT REM PT RETURN 9FT ADLT (ELECTROSURGICAL) ×2
ELECTRODE REM PT RTRN 9FT ADLT (ELECTROSURGICAL) ×1 IMPLANT
FACESHIELD WRAPAROUND (MASK) IMPLANT
FACESHIELD WRAPAROUND OR TEAM (MASK) IMPLANT
GAUZE SPONGE 4X4 12PLY STRL (GAUZE/BANDAGES/DRESSINGS) ×2 IMPLANT
GLOVE BIOGEL PI IND STRL 7.0 (GLOVE) ×1 IMPLANT
GLOVE BIOGEL PI INDICATOR 7.0 (GLOVE) ×1
GLOVE ECLIPSE 7.0 STRL STRAW (GLOVE) ×2 IMPLANT
GLOVE SKINSENSE NS SZ7.5 (GLOVE) ×3
GLOVE SKINSENSE STRL SZ7.5 (GLOVE) ×3 IMPLANT
GLOVE SURG SYN 7.5  E (GLOVE) ×1
GLOVE SURG SYN 7.5 E (GLOVE) ×1 IMPLANT
GLOVE SURG SYN 7.5 PF PI (GLOVE) ×1 IMPLANT
GOWN STRL REIN XL XLG (GOWN DISPOSABLE) ×2 IMPLANT
KIT BASIN OR (CUSTOM PROCEDURE TRAY) ×2 IMPLANT
KIT TURNOVER KIT B (KITS) ×2 IMPLANT
MANIFOLD NEPTUNE II (INSTRUMENTS) ×2 IMPLANT
NS IRRIG 1000ML POUR BTL (IV SOLUTION) ×4 IMPLANT
PACK ORTHO EXTREMITY (CUSTOM PROCEDURE TRAY) ×2 IMPLANT
PAD ARMBOARD 7.5X6 YLW CONV (MISCELLANEOUS) ×4 IMPLANT
SPONGE LAP 18X18 X RAY DECT (DISPOSABLE) ×2 IMPLANT
SUCTION FRAZIER HANDLE 10FR (MISCELLANEOUS) ×1
SUCTION TUBE FRAZIER 10FR DISP (MISCELLANEOUS) ×1 IMPLANT
SUT ETHILON 2 0 PSLX (SUTURE) ×2 IMPLANT
SUT ETHILON 3 0 PS 1 (SUTURE) ×2 IMPLANT
SUT PDS AB 0 CT 36 (SUTURE) IMPLANT
TOWEL OR 17X24 6PK STRL BLUE (TOWEL DISPOSABLE) IMPLANT
TOWEL OR 17X26 10 PK STRL BLUE (TOWEL DISPOSABLE) ×2 IMPLANT
TUBE CONNECTING 12X1/4 (SUCTIONS) ×2 IMPLANT
TUBING CYSTO DISP (UROLOGICAL SUPPLIES) ×1 IMPLANT
UNDERPAD 30X30 (UNDERPADS AND DIAPERS) ×2 IMPLANT
WATER STERILE IRR 1000ML POUR (IV SOLUTION) IMPLANT
WND VAC CANISTER 500ML (MISCELLANEOUS) ×2 IMPLANT

## 2017-09-29 NOTE — Anesthesia Postprocedure Evaluation (Signed)
Anesthesia Post Note  Patient: Brittany [name]  Procedure(s) Performed: AMPUTATION RIGHT 1ST RAY (Right )     Patient location during evaluation: PACU Anesthesia Type: General Level of consciousness: sedated, oriented and patient cooperative Pain management: pain level controlled Vital Signs Assessment: post-procedure vital signs reviewed and stable Respiratory status: spontaneous breathing, nonlabored ventilation, respiratory function stable and patient connected to nasal cannula oxygen Cardiovascular status: blood pressure returned to baseline and stable Postop Assessment: no apparent nausea or vomiting Anesthetic complications: no    Last Vitals:  Vitals:   09/29/17 1724 09/29/17 1739  BP: (!) 112/53 (!) 126/59  Pulse: 72 71  Resp: 14 11  Temp:    SpO2: 100% 100%    Last Pain:  Vitals:   09/29/17 1725  TempSrc:   PainSc: Asleep                 Dequon Schnebly,E. Julius Matus

## 2017-09-29 NOTE — Anesthesia Procedure Notes (Signed)
Procedure Name: LMA Insertion Date/Time: 09/29/2017 4:01 PM Performed by: Barrington Ellison, CRNA Pre-anesthesia Checklist: Patient identified, Emergency Drugs available, Suction available and Patient being monitored Patient Re-evaluated:Patient Re-evaluated prior to induction Oxygen Delivery Method: Circle System Utilized Preoxygenation: Pre-oxygenation with 100% oxygen Induction Type: IV induction Ventilation: Mask ventilation without difficulty LMA: LMA inserted LMA Size: 4.0 Number of attempts: 1 Placement Confirmation: positive ETCO2 Tube secured with: Tape Dental Injury: Teeth and Oropharynx as per pre-operative assessment

## 2017-09-29 NOTE — H&P (Signed)
PREOPERATIVE H&P  Chief Complaint: right foot osteomyelitis  HPI: Brittany [name] is a 72 y.o. female who presents for surgical treatment of right foot osteomyelitis.  She denies any changes in medical history.  Past Medical History:  Diagnosis Date  . Anemia    as a younger woman  . Arthritis   . Chronic pain    In pain clinic   . Diabetes mellitus   . GERD (gastroesophageal reflux disease)   . History of hiatal hernia   . Hypertension   . Neuropathy    Past Surgical History:  Procedure Laterality Date  . ABDOMINAL HYSTERECTOMY    . COLONOSCOPY    . I&D EXTREMITY Right 08/10/2017   Procedure: IRRIGATION AND DEBRIDEMENT FOOT;  Surgeon: Leandrew Koyanagi, MD;  Location: [place];  Service: Orthopedics;  Laterality: Right;  . LUMBAR FUSION    . TOE SURGERY Right    bone removed   Social History   Socioeconomic History  . Marital status: Widowed    Spouse name: Not on file  . Number of children: Not on file  . Years of education: Not on file  . Highest education level: Not on file  Occupational History  . Not on file  Social Needs  . Financial resource strain: Not on file  . Food insecurity:    Worry: Not on file    Inability: Not on file  . Transportation needs:    Medical: Not on file    Non-medical: Not on file  Tobacco Use  . Smoking status: Never Smoker  . Smokeless tobacco: Never Used  Substance and Sexual Activity  . Alcohol use: No  . Drug use: No  . Sexual activity: Not Currently    Partners: Male  Lifestyle  . Physical activity:    Days per week: Not on file    Minutes per session: Not on file  . Stress: Not on file  Relationships  . Social connections:    Talks on phone: Not on file    Gets together: Not on file    Attends religious service: Not on file    Active member of club or organization: Not on file    Attends meetings of clubs or organizations: Not on file    Relationship status: Not on file  Other Topics Concern  . Not on file    Social History Narrative  . Not on file   Family History  Problem Relation Age of Onset  . Cancer Mother        breast  . Depression Mother        bipolar  . Breast cancer Mother 109  . Heart disease Father 79       MI  . Diabetes Brother   . Kidney disease Brother   . Hypertension Brother   . Hyperlipidemia Brother   . Stroke Brother   . Coronary artery disease Unknown   . Diabetes Unknown    Allergies  Allergen Reactions  . Sulfonamide Derivatives Hives   Prior to Admission medications   Medication Sig Start Date End Date Taking? Authorizing Provider  amLODipine (NORVASC) 5 MG tablet TAKE 1 TABLET DAILY 06/24/17  Yes Roma Schanz R, DO  aspirin 81 MG tablet Take 81 mg by mouth at bedtime.    Yes [provider]  atorvastatin (LIPITOR) 10 MG tablet Take 1 tablet (10 mg total) by mouth daily. 06/18/17  Yes Ann Held, DO  bisoprolol-hydrochlorothiazide (ZIAC) 10-6.25 MG  tablet Take 1 tablet by mouth daily.   Yes [provider]  calcium carbonate (TUMS EX) 750 MG chewable tablet Chew 1 tablet by mouth daily as needed for heartburn.    Yes [provider]  Cholecalciferol (VITAMIN D) 1000 UNITS capsule Take 1,000 Units by mouth daily.   Yes [provider]  collagenase (SANTYL) ointment Apply 1 application topically daily. 08/22/17  Yes Leandrew Koyanagi, MD  docusate sodium (COLACE) 100 MG capsule Take 100 mg by mouth 2 (two) times daily.   Yes [provider]  escitalopram (LEXAPRO) 10 MG tablet Take 1 tablet (10 mg total) by mouth daily. 06/13/17  Yes Roma Schanz R, DO  estradiol (ESTRACE) 1 MG tablet TAKE 1 TABLET DAILY 06/13/17  Yes Ann Held, DO  Ferrous Sulfate 27 MG TABS Take 27 mg by mouth daily.   Yes [provider]  folic acid (FOLVITE) 836 MCG tablet Take 800 mcg by mouth daily.    Yes [provider]  furosemide (LASIX) 40 MG tablet Take 40 mg by mouth daily.  08/18/17  Yes  [provider]  gabapentin (NEURONTIN) 800 MG tablet Take 1 tablet (800 mg total) by mouth 4 (four) times daily. 05/04/15  Yes Lowne Chase, Yvonne R, DO  KLOR-CON M20 20 MEQ tablet Take 20 mEq by mouth daily.  08/18/17  Yes [provider]  morphine (MS CONTIN) 60 MG 12 hr tablet Take 60 mg by mouth 3 (three) times daily. 04/22/11  Yes Ann Held, DO  Multiple Vitamins-Minerals (MULTIVITAMIN GUMMIES WOMENS PO) Take 2 tablets by mouth daily.   Yes [provider]  omega-3 acid ethyl esters (LOVAZA) 1 G capsule Take 1 g by mouth daily.   Yes [provider]  omeprazole (PRILOSEC) 40 MG capsule TAKE 1 CAPSULE DAILY 06/13/17  Yes Roma Schanz R, DO  polyvinyl alcohol (LIQUIFILM TEARS) 1.4 % ophthalmic solution Place 1 drop into both eyes as needed for dry eyes. Patient taking differently: Place 1 drop into both eyes 2 (two) times daily as needed for dry eyes.  08/12/17  Yes Bonnell Public, MD  sitaGLIPtin-metformin (JANUMET) 50-1000 MG tablet Take 2 tablets po at bedtime daily 09/09/17  Yes Ann Held, DO  Specialty Vitamins Products (BIOTIN PLUS KERATIN) 10000-100 MCG-MG TABS Take 1 tablet by mouth daily.   Yes [provider]  tiZANidine (ZANAFLEX) 4 MG tablet Take 4 mg by mouth 3 (three) times daily.  08/26/10  Yes [provider]  vitamin B-12 (CYANOCOBALAMIN) 100 MCG tablet Take 100 mcg by mouth daily.   Yes [provider]  Blood Glucose Monitoring Suppl (ONE TOUCH ULTRA MINI) w/Device KIT Use as directed once a day.  Dx Code:  E11.42 05/01/17   Carollee Herter, Alferd Apa, DO  doxycycline (VIBRA-TABS) 100 MG tablet Take 1 tablet (100 mg total) by mouth 2 (two) times daily. Patient not taking: Reported on 09/26/2017 08/22/17   Leandrew Koyanagi, MD  doxycycline (VIBRA-TABS) 100 MG tablet TAKE 1  TAB bid x 10 days 09/17/17   Leandrew Koyanagi, MD  glucose blood (ONE TOUCH ULTRA TEST) test strip Check Blood sugar once daily  02/07/17   Carollee Herter, Alferd Apa, DO  l-methylfolate-B6-B12 (METANX) 3-35-2 MG TABS Take 1 tablet by mouth daily.    [provider]  loratadine (CLARITIN) 10 MG tablet Take 10 mg by mouth daily as needed. For allergies    [provider]  ONETOUCH DELICA LANCETS 94J MISC Check blood sugar once daily 02/07/17   Carollee Herter, Kendrick Fries R, DO     Positive ROS: All other systems have been reviewed and were otherwise negative with the exception of those mentioned in the HPI and as above.  Physical Exam: General: Alert, no acute distress Cardiovascular: No pedal edema Respiratory: No cyanosis, no use of accessory musculature GI: abdomen soft Skin: No lesions in the area of chief complaint Neurologic: Sensation intact distally Psychiatric: Patient is competent for consent with normal mood and affect Lymphatic: no lymphedema  MUSCULOSKELETAL: exam stable  Assessment: right foot osteomyelitis  Plan: Plan for Procedure(s): AMPUTATION RIGHT 1ST RAY  The risks benefits and alternatives were discussed with the patient including but not limited to the risks of nonoperative treatment, versus surgical intervention including infection, bleeding, nerve injury,  blood clots, cardiopulmonary complications, morbidity, mortality, among others, and they were willing to proceed.   Eduard Roux, MD   09/29/2017 7:12 AM

## 2017-09-29 NOTE — Transfer of Care (Addendum)
Immediate Anesthesia Transfer of Care Note  Patient: Brittany [name]  Procedure(s) Performed: AMPUTATION RIGHT 1ST RAY (Right )  Patient Location: PACU  Anesthesia Type:General  Level of Consciousness: lethargic and responds to stimulation  Airway & Oxygen Therapy: Patient Spontanous Breathing, Patient connected to face mask oxygen and oral airway in place  Post-op Assessment: Report given to RN  Post vital signs: Reviewed and stable  Last Vitals:  Vitals Value Taken Time  BP 115/44 09/29/2017  4:54 PM  Temp 36.5 C 09/29/2017  4:54 PM  Pulse 70 09/29/2017  4:55 PM  Resp 15 09/29/2017  4:55 PM  SpO2 91 % 09/29/2017  4:55 PM  Vitals shown include unvalidated device data.  Last Pain:  Vitals:   09/29/17 1654  TempSrc:   PainSc: Asleep      Patients Stated Pain Goal: 4 (30/85/69 4370)  Complications: No apparent anesthesia complications

## 2017-09-29 NOTE — Op Note (Addendum)
   Date of Surgery: 09/29/2017  INDICATIONS: Ms. Larkin is a 72 y.o.-year-old female with a right great toe osteomyelitis and chronic diabetic foot ulcer;  The patient did consent to the procedure after discussion of the risks and benefits.  PREOPERATIVE DIAGNOSIS: Right great toe osteomyelitis and plantar 1st metatarsal diabetic ulcer  POSTOPERATIVE DIAGNOSIS: Same.  PROCEDURE:  1.  Amputation of right great toe and partial first metatarsal 2.  Adjacent tissue rearrangement right foot 9 cm 3.  Application of wound VAC less than 50 cm 4.  Irrigation debridement of bone, muscle, skin, subcutaneous tissue 45 cm  SURGEON: N. Eduard Roux, M.D.  ASSIST: Ciro Backer [place], [place]; necessary for the timely completion of procedure and due to complexity of procedure.  ANESTHESIA:  general  IV FLUIDS AND URINE: See anesthesia.  ESTIMATED BLOOD LOSS: Minimal mL.  IMPLANTS: None  DRAINS: Wound VAC  COMPLICATIONS: None.  DESCRIPTION OF PROCEDURE: The patient was brought to the operating room and placed supine on the operating table.  The patient had been signed prior to the procedure and this was documented. The patient had the anesthesia placed by the anesthesiologist.  A time-out was performed to confirm that this was the correct patient, site, side and location. The patient did receive antibiotics prior to the incision and was re-dosed during the procedure as needed at indicated intervals.  A tourniquet was placed.  The patient had the operative extremity prepped and draped in the standard surgical fashion.    A racquet type incision based over the medial aspect of the first metatarsal was created.  Full-thickness flaps were elevated.  The soft tissues were gently handled with forceps.  The plantar ulcer was excised completely with a 15 blade.  Sharp excisional debridement of the bone, muscle, skin, subcutaneous tissue was performed using a rondure and knife.  I then performed a first ray  amputation through the midshaft of the first metatarsal.  There is no evidence of infection tracking proximally.  The tourniquet was then deflated and the tissues exhibited good punctate bleeding.  Hemostasis was obtained.  This was then thoroughly irrigated with 3 L normal saline.  Adjacent tissue rearrangement was performed in order to partially close the wound.  The remaining open wound could not be closed without tension therefore a wound VAC was applied.  Patient tolerated the procedure well had no immediate complications.  POSTOPERATIVE PLAN: Patient will be nonweightbearing until the wound is healed.  She will need wound VAC changes every other day.  We will get her set up for home wound VAC.  Dimensions of the wound are 4 cm wide x 4 cm long x 2 cm deep.  No tunneling.  Mild undermining.    [name], MD [place] 4:47 PM

## 2017-09-29 NOTE — Anesthesia Preprocedure Evaluation (Addendum)
Anesthesia Evaluation  Patient identified by MRN, date of birth, ID band Patient awake    Reviewed: Allergy & Precautions, NPO status , Patient's Chart, lab work & pertinent test results, reviewed documented beta blocker date and time   Airway Mallampati: II  TM Distance: >3 FB Neck ROM: Full    Dental  (+) Teeth Intact, Poor Dentition, Chipped, Dental Advisory Given,    Pulmonary    breath sounds clear to auscultation       Cardiovascular hypertension, Pt. on medications and Pt. on home beta blockers + Peripheral Vascular Disease   Rhythm:Regular Rate:Normal     Neuro/Psych Chronic pain with diabetic peripheral neuropathy: morphine    GI/Hepatic Neg liver ROS, GERD  Medicated,  Endo/Other  diabetes (glu 96), Oral Hypoglycemic Agents  Renal/GU negative Renal ROS     Musculoskeletal   Abdominal   Peds  Hematology negative hematology ROS (+)   Anesthesia Other Findings   Reproductive/Obstetrics                            Anesthesia Physical Anesthesia Plan  ASA: III  Anesthesia Plan: General   Post-op Pain Management:    Induction: Intravenous  PONV Risk Score and Plan: 3 and Ondansetron, Dexamethasone, Diphenhydramine and Treatment may vary due to age or medical condition  Airway Management Planned: LMA  Additional Equipment:   Intra-op Plan:   Post-operative Plan:   Informed Consent: I have reviewed the patients History and Physical, chart, labs and discussed the procedure including the risks, benefits and alternatives for the proposed anesthesia with the patient or authorized representative who has indicated his/her understanding and acceptance.   Dental advisory given  Plan Discussed with: Surgeon and CRNA  Anesthesia Plan Comments: (Plan routine monitors, GA- LMA OK)        Anesthesia Quick Evaluation

## 2017-09-30 ENCOUNTER — Encounter (HOSPITAL_COMMUNITY): Payer: Self-pay | Admitting: Orthopaedic Surgery

## 2017-09-30 LAB — GLUCOSE, CAPILLARY
Glucose-Capillary: 138 mg/dL — ABNORMAL HIGH (ref 70–99)
Glucose-Capillary: 128 mg/dL — ABNORMAL HIGH (ref 70–99)
Glucose-Capillary: 181 mg/dL — ABNORMAL HIGH (ref 70–99)
Glucose-Capillary: 123 mg/dL — ABNORMAL HIGH (ref 70–99)

## 2017-09-30 NOTE — Discharge Summary (Addendum)
Patient ID: Brittany [name] MRN: [id number] DOB/AGE: 17-May-1945 72 y.o.  Admit date: 09/29/2017 Discharge date: 10/02/2017  Admission Diagnoses:  Active Problems:   Osteomyelitis of foot, right, acute [facility])   Discharge Diagnoses:  Same  Past Medical History:  Diagnosis Date  . Anemia    as a younger woman  . Arthritis    "all over my body" (10/01/2017)  . Chronic pain    In pain clinic   . Diabetic peripheral neuropathy ([place])   . GERD (gastroesophageal reflux disease)   . High cholesterol   . History of hiatal hernia   . Hypertension   . 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 female who was admitted 09/29/2017 for operative treatment of right foot osteomyelitis. Patient has severe unremitting pain that affects sleep, daily activities, and work/hobbies. After pre-op clearance the patient was taken to the operating room on 09/29/2017 and underwent  Procedure(s): AMPUTATION RIGHT 1ST RAY.    Patient was given perioperative antibiotics:  Anti-infectives (From admission, onward)   Start     Dose/Rate Route Frequency Ordered Stop   09/29/17 2200  ceFAZolin (ANCEF) IVPB 2g/100 mL premix     2 g 200 mL/hr over 30 Minutes Intravenous Every 6 hours 09/29/17 1847 09/30/17 1337   09/29/17 1830  ceFAZolin (ANCEF) IVPB 2g/100 mL premix  Status:  Discontinued     2 g 200 mL/hr over 30 Minutes Intravenous Every 6 hours 09/29/17 1828 09/29/17 1847   09/29/17 1633  vancomycin (VANCOCIN) powder  Status:  Discontinued       As needed 09/29/17 1633 09/29/17 1649   09/29/17 1200  ceFAZolin (ANCEF) IVPB 2g/100 mL premix     2 g 200 mL/hr over 30 Minutes Intravenous On call to O.R. 09/29/17 1157 09/29/17 1555       Patient was given sequential compression devices, early ambulation, and chemoprophylaxis to prevent  DVT.  Patient benefited maximally from hospital stay and there were no complications.    Recent vital signs:  Patient Vitals for the past 24 hrs:  BP Temp Temp src Pulse Resp SpO2  10/02/17 0400 137/62 97.8 F (36.6 C) Oral 60 16 100 %  10/01/17 2120 132/64 98.8 F (37.1 C) Oral (!) 55 18 98 %  10/01/17 1436 126/63 98.5 F (36.9 C) Oral 63 18 95 %     Recent laboratory studies:  Recent Labs    09/29/17 1337  WBC 10.4  HGB 11.4*  HCT 36.3  PLT 243  NA 140  K 3.6  CL 99*  CO2 31  BUN 15  CREATININE 0.75  GLUCOSE 96  CALCIUM 9.3     Discharge Medications:   Allergies as of 10/02/2017      Reactions   Sulfonamide Derivatives Hives      Medication List    STOP taking these medications   doxycycline 100 MG tablet Commonly known as:  VIBRA-TABS   morphine 60 MG 12 hr tablet Commonly known as:  MS CONTIN     TAKE these medications   amLODipine 5 MG tablet Commonly known as:  NORVASC TAKE 1 TABLET DAILY   aspirin 81 MG tablet Take 81 mg by mouth at bedtime.   atorvastatin 10 MG tablet Commonly known as:  LIPITOR Take 1 tablet (10 mg total) by mouth daily.   Biotin  Plus Keratin 10000-100 MCG-MG Tabs Take 1 tablet by mouth daily.   bisoprolol-hydrochlorothiazide 10-6.25 MG tablet Commonly known as:  ZIAC Take 1 tablet by mouth daily.   calcium carbonate 750 MG chewable tablet Commonly known as:  TUMS EX Chew 1 tablet by mouth daily as needed for heartburn.   collagenase ointment Commonly known as:  SANTYL Apply 1 application topically daily.   docusate sodium 100 MG capsule Commonly known as:  COLACE Take 100 mg by mouth 2 (two) times daily.   escitalopram 10 MG tablet Commonly known as:  LEXAPRO Take 1 tablet (10 mg total) by mouth daily.   estradiol 1 MG tablet Commonly known as:  ESTRACE TAKE 1 TABLET DAILY   Ferrous Sulfate 27 MG Tabs Take 27 mg by mouth daily.   folic acid 223 MCG tablet Commonly known as:  FOLVITE Take 800 mcg by  mouth daily.   furosemide 40 MG tablet Commonly known as:  LASIX Take 40 mg by mouth daily.   gabapentin 800 MG tablet Commonly known as:  NEURONTIN Take 1 tablet (800 mg total) by mouth 4 (four) times daily.   glucose blood test strip Commonly known as:  ONE TOUCH ULTRA TEST Check Blood sugar once daily   KLOR-CON M20 20 MEQ tablet Generic drug:  potassium chloride SA Take 20 mEq by mouth daily.   l-methylfolate-B6-B12 3-35-2 MG Tabs tablet Commonly known as:  METANX Take 1 tablet by mouth daily.   loratadine 10 MG tablet Commonly known as:  CLARITIN Take 10 mg by mouth daily as needed. For allergies   MULTIVITAMIN GUMMIES WOMENS PO Take 2 tablets by mouth daily.   omega-3 acid ethyl esters 1 g capsule Commonly known as:  LOVAZA Take 1 g by mouth daily.   omeprazole 40 MG capsule Commonly known as:  PRILOSEC TAKE 1 CAPSULE DAILY   ONE TOUCH ULTRA MINI w/Device Kit Use as directed once a day.  Dx Code:  V61.22   ONETOUCH DELICA LANCETS 44L Misc Check blood sugar once daily   oxyCODONE-acetaminophen 5-325 MG tablet Commonly known as:  PERCOCET Take 1-2 tablets by mouth every 4 (four) hours as needed for severe pain.   polyvinyl alcohol 1.4 % ophthalmic solution Commonly known as:  LIQUIFILM TEARS Place 1 drop into both eyes as needed for dry eyes. What changed:  when to take this   sitaGLIPtin-metformin 50-1000 MG tablet Commonly known as:  JANUMET Take 2 tablets po at bedtime daily   tiZANidine 4 MG tablet Commonly known as:  ZANAFLEX Take 4 mg by mouth 3 (three) times daily.   vitamin B-12 100 MCG tablet Commonly known as:  CYANOCOBALAMIN Take 100 mcg by mouth daily.   Vitamin D 1000 units capsule Take 1,000 Units by mouth daily.            Durable Medical Equipment  (From admission, onward)        Start     Ordered   09/29/17 1828  DME Walker rolling  Once    Question:  Patient needs a walker to treat with the following condition   Answer:  Osteomyelitis of foot, right, acute ([place])   09/29/17 1828   09/29/17 1828  DME 3 n 1  Once     09/29/17 1828   09/29/17 1828  DME Bedside commode  Once    Question:  Patient needs a bedside commode to treat with the following condition  Answer:  Osteomyelitis of foot, right, acute ([place])   09/29/17  0964      Diagnostic Studies: Mr Foot Right W Wo Contrast  Result Date: 09/21/2017 CLINICAL DATA:  Chronic ulceration on the right great toe with pain and swelling in a diabetic patient. The patient was discharged from the hospital 08/12/2017 after debridement of wound 08/10/2017. EXAM: MRI OF THE RIGHT FOREFOOT WITHOUT AND WITH CONTRAST TECHNIQUE: Multiplanar, multisequence MR imaging of the right forefoot was performed before and after the administration of intravenous contrast. CONTRAST:  15 ml MULTIHANCE GADOBENATE DIMEGLUMINE 529 MG/ML IV SOLN COMPARISON:  MRI right forefoot 08/09/2017. Plain films right foot 08/08/2017. FINDINGS: Bones/Joint/Cartilage The patient is status post hallux valgus repair. Artifact from a screw for fixation of first metatarsal osteotomy limits the study. The distal first metatarsal is largely obscured. There is mildly decreased T1 signal in the proximal phalanx of the great toe with increased signal on T2 and postcontrast imaging. No fracture is identified. Degenerative change is seen about the tarsometatarsal joints, worst at the second, third and fourth. Ligaments Intact. Muscles and Tendons Atrophy of intrinsic musculature the foot is identified. No intramuscular fluid collection. Soft tissues No abscess is identified. Skin ulceration on the plantar surface of the foot at the level of the first MTP joint is noted. IMPRESSION: Artifact from a screw in the distal first metatarsal limits the study. Mildly increased T2 signal and enhancement with corresponding mild decreased T1 signal in the proximal phalanx of the great toe is worrisome for osteomyelitis. Negative for  abscess or septic joint. Skin ulceration on the plantar surface of the foot Electronically Signed   By: Inge Rise M.D.   On: 09/21/2017 17:39    Disposition: Discharge disposition: 01-Home or Self Care         Follow-up Information    Leandrew Koyanagi, MD In 2 weeks.   Specialty:  Orthopedic Surgery Why:  For wound re-check Contact information: [address] [place] [address] [phone]            Signed: Aundra Dubin 10/02/2017, 7:42 AM

## 2017-09-30 NOTE — Care Management Note (Signed)
Case Management Note  Patient Details  Name: Brittany [name] MRN: [id number] Date of Birth: 11/20/1945  Subjective/Objective:   72 yr old female admitted with osteo of right foot 1st ray. Patient underwent amputation of right foot 1st ray.   Action/Plan:Patient will need shortterm rehab at [facility].        Expected Discharge Date:  pending              Expected Discharge Plan:     In-House Referral:     Discharge planning Services     Post Acute Care Choice:    Choice offered to:     DME Arranged:    DME Agency:     HH Arranged:    HH Agency:     Status of Service:     If discussed at [employer] of Stay Meetings, dates discussed:    Additional Comments:  Ninfa Meeker, RN 09/30/2017, 2:13 PM

## 2017-09-30 NOTE — Social Work (Signed)
CSW f/u for disposition.  Pt will need OT/PT evaluation.  Pt will also need Insurance Auth for SNF placement.  CSW will continue to follow.  Elissa Hefty, LCSW Clinical Social Worker [phone]

## 2017-09-30 NOTE — Progress Notes (Addendum)
Subjective: 1 Day Post-Op Procedure(s) (LRB): AMPUTATION RIGHT 1ST RAY (Right) Patient reports pain as mild.  Doing well this am. Had some burning with urination last night and a foley was placed. Concerned about going home as she lives alone and does not have anyone to stay with her.  Objective: Vital signs in last 24 hours: Temp:  [97.7 F (36.5 C)-98.3 F (36.8 C)] 98.2 F (36.8 C) (06/25 0503) Pulse Rate:  [59-76] 63 (06/25 0503) Resp:  [11-18] 15 (06/25 0503) BP: (112-142)/(44-70) 127/63 (06/25 0503) SpO2:  [92 %-100 %] 96 % (06/25 0503) Weight:  [175 lb (79.4 kg)] 175 lb (79.4 kg) (06/24 1346)  Intake/Output from previous day: 06/24 0701 - 06/25 0700 In: 240 [P.O.:240] Out: 30 [Blood:30] Intake/Output this shift: No intake/output data recorded.  Recent Labs    09/29/17 1337  HGB 11.4*   Recent Labs    09/29/17 1337  WBC 10.4  RBC 4.17  HCT 36.3  PLT 243   Recent Labs    09/29/17 1337  NA 140  K 3.6  CL 99*  CO2 31  BUN 15  CREATININE 0.75  GLUCOSE 96  CALCIUM 9.3   No results for input(s): LABPT, INR in the last 72 hours.  Neurologically intact Neurovascular intact Sensation intact distally Intact pulses distally Dorsiflexion/Plantar flexion intact Incision: dressing C/D/I No cellulitis present Compartment soft  Wound vac functioning well- 50cc serosanguinous fluid in canister    Assessment/Plan: 1 Day Post-Op Procedure(s) (LRB): AMPUTATION RIGHT 1ST RAY (Right) Advance diet Up with therapy D/C IV fluids Discharge to SNF once bed available  NWB RLE Continue wound vac changes every other day Will order urinalysis     Aundra Dubin 09/30/2017, 7:44 AM

## 2017-10-01 ENCOUNTER — Encounter (HOSPITAL_COMMUNITY): Payer: Self-pay | Admitting: General Practice

## 2017-10-01 LAB — GLUCOSE, CAPILLARY
Glucose-Capillary: 94 mg/dL (ref 70–99)
Glucose-Capillary: 115 mg/dL — ABNORMAL HIGH (ref 70–99)
Glucose-Capillary: 115 mg/dL — ABNORMAL HIGH (ref 70–99)
Glucose-Capillary: 104 mg/dL — ABNORMAL HIGH (ref 70–99)

## 2017-10-01 MED ORDER — BLISTEX MEDICATED EX OINT
TOPICAL_OINTMENT | CUTANEOUS | Status: DC | PRN
  Filled 2017-10-01: qty 6.3

## 2017-10-01 MED ORDER — ENSURE ENLIVE PO LIQD
237.0000 mL | Freq: Two times a day (BID) | ORAL | Status: DC
  Administered 2017-10-01 – 2017-10-02 (×2): 237 mL via ORAL

## 2017-10-01 NOTE — Progress Notes (Signed)
Subjective: 2 Days Post-Op Procedure(s) (LRB): AMPUTATION RIGHT 1ST RAY (Right) Patient reports pain as mild.    Objective: Vital signs in last 24 hours: Temp:  [97.6 F (36.4 C)-98.1 F (36.7 C)] 97.6 F (36.4 C) (06/26 0500) Pulse Rate:  [59-64] 64 (06/26 0500) Resp:  [18] 18 (06/25 1426) BP: (116-129)/(52-73) 126/66 (06/26 0500) SpO2:  [99 %-100 %] 100 % (06/26 0500)  Intake/Output from previous day: 06/25 0701 - 06/26 0700 In: 960 [P.O.:960] Out: -  Intake/Output this shift: No intake/output data recorded.  Recent Labs    09/29/17 1337  HGB 11.4*   Recent Labs    09/29/17 1337  WBC 10.4  RBC 4.17  HCT 36.3  PLT 243   Recent Labs    09/29/17 1337  NA 140  K 3.6  CL 99*  CO2 31  BUN 15  CREATININE 0.75  GLUCOSE 96  CALCIUM 9.3   No results for input(s): LABPT, INR in the last 72 hours.  Neurologically intact Neurovascular intact Intact pulses distally Compartment soft  Wound vac in place and functioning-100cc serosang fluid in canister    Assessment/Plan: 2 Days Post-Op Procedure(s) (LRB): AMPUTATION RIGHT 1ST RAY (Right) Advance diet Up with therapy Discharge home with home health once she mobilizes ok with PT and [facility] unit approved/delivered NWB RLE Continue wound vac with changes every other day    Brittany [name] 10/01/2017, 7:18 AM

## 2017-10-01 NOTE — Evaluation (Signed)
Occupational Therapy Evaluation Patient Details Name: Brittany [name] MRN: [id number] DOB: [birth year] Today's Date: 10/01/2017    History of Present Illness 72 yo female with onset of R foot amputation of 1st ray after onset osteomyelitis was admitted and found to have persistent pain and loss of daily activities as a result.  PMHx:  DM, GERD, PN, HTN, hiatal hernia, chronic pain, anemia   Clinical Impression   Pt with decline in function and safety with ADLs and ADL mobility with decreased strength, balance and endurance. Pt would benefit from acute OT services to address impairments to maximize level of function and safety    Follow Up Recommendations  SNF    Equipment Recommendations  Other (comment)(TBD at next venue of care)    Recommendations for Other Services       Precautions / Restrictions Precautions Precautions: Fall Precaution Comments: wound vac R foot Restrictions Weight Bearing Restrictions: Yes Other Position/Activity Restrictions: NWB      Mobility Bed Mobility Overal bed mobility: Independent                Transfers Overall transfer level: Needs assistance Equipment used: Rolling walker (2 wheeled);1 person hand held assist Transfers: Sit to/from Stand Sit to Stand: Min assist              Balance Overall balance assessment: Needs assistance Sitting-balance support: Feet supported Sitting balance-Leahy Scale: Good     Standing balance support: Bilateral upper extremity supported;During functional activity                               ADL either performed or assessed with clinical judgement   ADL Overall ADL's : Needs assistance/impaired Eating/Feeding: Independent;Sitting   Grooming: Wash/dry hands;Wash/dry face;Min guard;Sitting   Upper Body Bathing: Min guard;Sitting   Lower Body Bathing: Moderate assistance   Upper Body Dressing : Min guard;Sitting   Lower Body Dressing: Moderate assistance   Toilet  Transfer: Minimal assistance;Stand-pivot;BSC;RW   Toileting- Clothing Manipulation and Hygiene: Moderate assistance       Functional mobility during ADLs: Minimal assistance;Rolling walker       Vision Baseline Vision/History: Wears glasses Wears Glasses: Reading only Patient Visual Report: No change from baseline       Perception     Praxis      Pertinent Vitals/Pain Pain Assessment: 0-10 Pain Score: 3  Pain Location: R foot when dependent Pain Descriptors / Indicators: Operative site guarding Pain Intervention(s): Monitored during session;RN gave pain meds during session;Repositioned     Hand Dominance Right   Extremity/Trunk Assessment Upper Extremity Assessment Upper Extremity Assessment: Generalized weakness   Lower Extremity Assessment Lower Extremity Assessment: Defer to PT evaluation   Cervical / Trunk Assessment Cervical / Trunk Assessment: Normal   Communication Communication Communication: No difficulties   Cognition Arousal/Alertness: Awake/alert Behavior During Therapy: WFL for tasks assessed/performed Overall Cognitive Status: Within Functional Limits for tasks assessed                                     General Comments       Exercises     Shoulder Instructions      Home Living Family/patient expects to be discharged to:: Private residence Living Arrangements: Alone Available Help at Discharge: Friend(s);Available 24 hours/day Type of Home: House Home Access: Level entry     Home Layout: One level  Bathroom Shower/Tub: Occupational psychologist: Handicapped height Bathroom Accessibility: Yes   Home Equipment: Environmental consultant - standard          Prior Functioning/Environment Level of Independence: Independent with assistive device(s)                 OT Problem List: Decreased strength;Decreased activity tolerance;Decreased knowledge of use of DME or AE;Impaired balance (sitting and/or  standing);Decreased coordination;Pain      OT Treatment/Interventions: Self-care/ADL training;Therapeutic exercise;Balance training;DME and/or AE instruction;Neuromuscular education;Therapeutic activities;Patient/family education    OT Goals(Current goals can be found in the care plan section) Acute Rehab OT Goals Patient Stated Goal: to go home with family to assist OT Goal Formulation: With patient Time For Goal Achievement: 10/15/17 Potential to Achieve Goals: Good ADL Goals Pt Will Perform Grooming: with min assist;standing Pt Will Perform Upper Body Bathing: with set-up;with supervision;sitting Pt Will Perform Lower Body Bathing: with min assist;sitting/lateral leans Pt Will Perform Upper Body Dressing: with set-up;with supervision;sitting Pt Will Transfer to Toilet: with min guard assist;stand pivot transfer;bedside commode;ambulating;grab bars Pt Will Perform Toileting - Clothing Manipulation and hygiene: with min assist;sitting/lateral leans;sit to/from stand  OT Frequency: Min 2X/week   Barriers to D/C: Decreased caregiver support          Co-evaluation              AM-PAC PT "6 Clicks" Daily Activity     Outcome Measure Help from another person eating meals?: None Help from another person taking care of personal grooming?: A Little Help from another person toileting, which includes using toliet, bedpan, or urinal?: A Little Help from another person bathing (including washing, rinsing, drying)?: A Lot Help from another person to put on and taking off regular upper body clothing?: A Little Help from another person to put on and taking off regular lower body clothing?: A Lot 6 Click Score: 17   End of Session Equipment Utilized During Treatment: Gait belt;Rolling walker;Other (comment)(BSC)  Activity Tolerance: Patient tolerated treatment well Patient left: in chair;with call bell/phone within reach  OT Visit Diagnosis: Unsteadiness on feet (R26.81);Other  abnormalities of gait and mobility (R26.89);Muscle weakness (generalized) (M62.81);Pain Pain - Right/Left: Right Pain - part of body: Ankle and joints of foot                Time: 8338-2505 OT Time Calculation (min): 28 min Charges:  OT General Charges $OT Visit: 1 Visit OT Evaluation $OT Eval Moderate Complexity: 1 Mod OT Treatments $Therapeutic Activity: 8-22 mins G-Codes: OT G-codes **NOT FOR INPATIENT CLASS** Functional Assessment Tool Used: AM-PAC 6 Clicks Daily Activity    Britt Bottom 10/01/2017, 2:17 PM

## 2017-10-01 NOTE — Progress Notes (Signed)
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  PMHx:  DM, GERD, PN, HTN, hiatal hernia, chronic pain, anemia    PT Comments    Pt previously expressed interest in ambulating with crutches, but experienced frequent LOB when performing sit-stand transfers. She tolerated RW more, ambulating to the door and back, limited by fatigue. Pt was incontinent of bladder during treatment session.  Follow Up Recommendations  SNF     Equipment Recommendations  None recommended by PT    Recommendations for Other Services       Precautions / Restrictions Precautions Precautions: Fall Precaution Comments: wound vac R foot Restrictions Weight Bearing Restrictions: Yes Other Position/Activity Restrictions: NWB    Mobility  Bed Mobility Overal bed mobility: Independent                Transfers Overall transfer level: Needs assistance Equipment used: Rolling walker (2 wheeled);Crutches Transfers: Sit to/from [facility] Sit to Stand: Min assist;Min guard(Min assist with crutches, min guard with walker) Stand pivot transfers: Min guard       General transfer comment: Unsteady when transferring sit-stand with crutches, LOB with each of 3 attempts. SPTA and pt agreed to switch to RW.  Ambulation/Gait Ambulation/Gait assistance: Min guard(for safety) Gait Distance (Feet): 40 Feet Assistive device: Rolling walker (2 wheeled)   Gait velocity: reduced   General Gait Details: hopping steps on LLE   Stairs             Wheelchair Mobility    Modified Rankin (Stroke Patients Only)       Balance Overall balance assessment: Needs assistance Sitting-balance support: Feet  supported Sitting balance-Leahy Scale: Good     Standing balance support: Bilateral upper extremity supported;During functional activity Standing balance-Leahy Scale: Poor Standing balance comment: Inadequately supported with crutches, 3x LOB to R; switched to RW                            Cognition Arousal/Alertness: Awake/alert Behavior During Therapy: WFL for tasks assessed/performed Overall Cognitive Status: Within Functional Limits for tasks assessed                                        Exercises      General Comments General comments (skin integrity, edema, etc.): Pt had wound vac on R foot      Pertinent Vitals/Pain Pain Assessment: Faces Pain Score: 3  Faces Pain Scale: Hurts little more Pain Location: R foot when dependent Pain Descriptors / Indicators: Operative site guarding Pain Intervention(s): Monitored during session;Repositioned    Home Living Family/patient expects to be discharged to:: Unsure Living Arrangements: Alone Available Help at Discharge: Friend(s);Available 24 hours/day Type of Home: House Home Access: Level entry   Home Layout: One level Home Equipment: Walker - standard      Prior Function Level of Independence: Independent with assistive device(s)          PT Goals (current goals can now be found in the care plan section) Acute Rehab PT Goals Patient Stated Goal: to go home with family to assist PT Goal Formulation:  With patient Potential to Achieve Goals: Good    Frequency    Min 3X/week      PT Plan      Co-evaluation              AM-PAC PT "6 Clicks" Daily Activity  Outcome Measure  Difficulty turning over in bed (including adjusting bedclothes, sheets and blankets)?: A Little Difficulty moving from lying on back to sitting on the side of the bed? : A Little Difficulty sitting down on and standing up from a chair with arms (e.g., wheelchair, bedside commode, etc,.)?: A Lot Help  needed moving to and from a bed to chair (including a wheelchair)?: A Little Help needed walking in hospital room?: A Lot Help needed climbing 3-5 steps with a railing? : A Lot 6 Click Score: 15    End of Session Equipment Utilized During Treatment: Gait belt Activity Tolerance: Patient tolerated treatment well;Patient limited by fatigue Patient left: with call bell/phone within reach;in bed;with bed alarm set Nurse Communication: Mobility status(Nurse case manager) PT Visit Diagnosis: Unsteadiness on feet (R26.81);Other abnormalities of gait and mobility (R26.89);Repeated falls (R29.6);Muscle weakness (generalized) (M62.81);Difficulty in walking, not elsewhere classified (R26.2);Adult, failure to thrive (R62.7)     Time: 1341-1430 PT Time Calculation (min) (ACUTE ONLY): 49 min  Charges:  $Gait Training: 8-22 mins $Therapeutic Activity: 23-37 mins                    G Codes:       Scarlett Presto, SPTA   Liba Hulsey Sheila Oats 10/01/2017, 4:25 PM

## 2017-10-01 NOTE — Progress Notes (Signed)
Pt has received evaluation and was visited by her brother who may be helpful with her care after rehab.  Pt will receive PT for strengthening and balance control of the use of equipment.  Has fairly level home but will be unable to do all her own care with the transition.  Follow acutely to ease the change to home.    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  PMHx:  DM, GERD, PN, HTN, hiatal hernia, chronic pain, anemia  Subjective Data  Patient Stated Goal to go home with family to assist  Precautions  Precautions Fall  Precaution Comments wound vac R foot  Restrictions  Weight Bearing Restrictions Yes  Other Position/Activity Restrictions NWB  Pain Assessment  Pain Assessment Faces  Faces Pain Scale 6  Pain Location R foot when dependent  Pain Descriptors / Indicators Operative site guarding  Pain Intervention(s) Monitored during session;Premedicated before session;Repositioned  Cognition  Arousal/Alertness Awake/alert  Behavior During Therapy WFL for tasks assessed/performed  Overall Cognitive Status Within Functional Limits for tasks assessed  Bed Mobility  Overal bed mobility Independent  Transfers  Overall transfer level Needs assistance  Equipment used Rolling walker (2 wheeled);1 person hand held assist  Transfers Sit to/from Stand  Sit to Stand Min assist  General transfer comment min assist from higher surfaces  Ambulation/Gait  Gait Distance (Feet) 5 Feet  Assistive device Rolling walker (2 wheeled);1 person hand held assist  General Gait Details hopping steps on LLE after getting up to side of bed  Gait velocity reduced  Gait velocity interpretation <1.8 ft/sec, indicate of risk for recurrent falls  Balance  Overall  balance assessment Needs assistance  Sitting-balance support Feet supported  Sitting balance-Leahy Scale Good  Standing balance support Bilateral upper extremity supported;During functional activity  Standing balance-Leahy Scale Poor  General Comments  General comments (skin integrity, edema, etc.) pt had sealed vac dressing on R foot  PT - End of Session  Equipment Utilized During Treatment Gait belt  Activity Tolerance Patient tolerated treatment well;Patient limited by fatigue  Patient left in chair;with call bell/phone within reach;with chair alarm set  Nurse Communication Mobility status   PT - Assessment/Plan  PT Visit Diagnosis Unsteadiness on feet (R26.81);Other abnormalities of gait and mobility (R26.89);Repeated falls (R29.6);Muscle weakness (generalized) (M62.81);Difficulty in walking, not elsewhere classified (R26.2);Adult, failure to thrive (R62.7)  PT Frequency (ACUTE ONLY) Min 3X/week  Follow Up Recommendations SNF  PT equipment None recommended by PT  AM-PAC PT "6 Clicks" Daily Activity Outcome Measure  Difficulty turning over in bed (including adjusting bedclothes, sheets and blankets)? 3  Difficulty moving from lying on back to sitting on the side of the bed?  1  Difficulty sitting down on and standing up from a chair with arms (e.g., wheelchair, bedside commode, etc,.)? 1  Help needed moving to and from a bed to chair (including a wheelchair)? 3  Help needed walking in hospital room? 2  Help needed climbing 3-5 steps with a railing?  2  6 Click Score 12  Mobility G Code  CL  Acute Rehab PT Goals  PT Goal Formulation With patient  Time For Goal Achievement 10/14/17  Potential to Achieve Goals Good  PT Time Calculation  PT Start Time (  ACUTE ONLY) 1612  PT Stop Time (ACUTE ONLY) 1640  PT Time Calculation (min) (ACUTE ONLY) 28 min  PT G-Codes **NOT FOR INPATIENT CLASS**  Functional Assessment Tool Used AM-PAC 6 Clicks Basic Mobility  PT General Charges  $$ ACUTE PT  VISIT 1 Visit  PT Evaluation  $PT Eval Moderate Complexity 1 Mod  PT Treatments  $Gait Training 8-22 mins    Mee Hives, PT MS Acute Rehab Dept. Number: [place] and [place]

## 2017-10-01 NOTE — Plan of Care (Signed)

## 2017-10-02 LAB — URINALYSIS, ROUTINE W REFLEX MICROSCOPIC
Bacteria, UA: NONE SEEN
Bilirubin Urine: NEGATIVE
GLUCOSE, UA: NEGATIVE mg/dL
Hgb urine dipstick: NEGATIVE
Ketones, ur: NEGATIVE mg/dL
Nitrite: NEGATIVE
Protein, ur: NEGATIVE mg/dL
SPECIFIC GRAVITY, URINE: 1.006 (ref 1.005–1.030)
pH: 8 (ref 5.0–8.0)

## 2017-10-02 LAB — GLUCOSE, CAPILLARY
GLUCOSE-CAPILLARY: 101 mg/dL — AB (ref 70–99)
Glucose-Capillary: 83 mg/dL (ref 70–99)

## 2017-10-02 NOTE — Plan of Care (Signed)
  Problem: Health Behavior/Discharge Planning: Goal: Ability to manage health-related needs will improve Outcome: Progressing   

## 2017-10-02 NOTE — Progress Notes (Addendum)
Nutrition Brief Note  Patient identified on the Malnutrition Screening Tool (MST) Report.  Per readings below, pt has had a 5% weight loss since March 2019.   Weight loss not significant for time frame.  Wt Readings from Last 15 Encounters:  09/29/17 175 lb (79.4 kg)  08/08/17 172 lb (78 kg)  08/06/17 172 lb (78 kg)  06/13/17 185 lb 6.4 oz (84.1 kg)  05/15/17 187 lb 12.8 oz (85.2 kg)  05/01/17 189 lb 9.6 oz (86 kg)  03/31/17 190 lb 3 oz (86.3 kg)  09/20/16 176 lb 9.6 oz (80.1 kg)  02/19/16 180 lb 3.2 oz (81.7 kg)  05/04/15 179 lb 9.6 oz (81.5 kg)  12/07/14 179 lb 3.2 oz (81.3 kg)  02/11/14 181 lb 12.8 oz (82.5 kg)  07/02/13 188 lb 9.6 oz (85.5 kg)  05/14/13 195 lb 3.2 oz (88.5 kg)  09/03/12 187 lb 6.4 oz (85 kg)   Body mass index is 25.47 kg/m. Patient meets criteria for Overweight based on current BMI.   Current diet order is Carbohydrate Modified, patient is consuming approximately 75% of meals at this time. Labs and medications reviewed.   No nutrition interventions warranted at this time. If nutrition issues arise, please consult RD.   Arthur Holms, RD, LDN Pager #: [phone] After-Hours Pager #: [phone]

## 2017-10-02 NOTE — Progress Notes (Signed)
Subjective: 3 Days Post-Op Procedure(s) (LRB): AMPUTATION RIGHT 1ST RAY (Right) Patient reports pain as mild.  Doing well this am.  Ready to go home.  Objective: Vital signs in last 24 hours: Temp:  [97.8 F (36.6 C)-98.8 F (37.1 C)] 97.8 F (36.6 C) (06/27 0400) Pulse Rate:  [55-63] 60 (06/27 0400) Resp:  [16-18] 16 (06/27 0400) BP: (126-137)/(62-64) 137/62 (06/27 0400) SpO2:  [95 %-100 %] 100 % (06/27 0400)  Intake/Output from previous day: 06/26 0701 - 06/27 0700 In: 360 [P.O.:360] Out: -  Intake/Output this shift: No intake/output data recorded.  Recent Labs    09/29/17 1337  HGB 11.4*   Recent Labs    09/29/17 1337  WBC 10.4  RBC 4.17  HCT 36.3  PLT 243   Recent Labs    09/29/17 1337  NA 140  K 3.6  CL 99*  CO2 31  BUN 15  CREATININE 0.75  GLUCOSE 96  CALCIUM 9.3   No results for input(s): LABPT, INR in the last 72 hours.  Neurologically intact Neurovascular intact Intact pulses distally Dorsiflexion/Plantar flexion intact Incision: dressing C/D/I No cellulitis present Compartment soft  Wound vac- 125cc serosanguinous fluid in canister    Assessment/Plan: 3 Days Post-Op Procedure(s) (LRB): AMPUTATION RIGHT 1ST RAY (Right) Up with therapy Discharge home with home health RN once [facility] approved by insurance (paperwork signed in chart) NWB RLE Continue wound vac changes every other day    Aundra Dubin 10/02/2017, 7:39 AM

## 2017-10-02 NOTE — Progress Notes (Signed)
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  PMHx:  DM, GERD, PN, HTN, hiatal hernia, chronic pain, anemia   OT comments  Pt making good progress with functional goals, able to ambulate to bathroom with RW and maintains R LE NWB. Pt will now d/c home with sup and assist from a friend. Pt very motivated, pleasant and cooperative  Follow Up Recommendations  Home health OT    Equipment Recommendations  3 in 1 bedside commode    Recommendations for Other Services      Precautions / Restrictions Precautions Precautions: Fall Precaution Comments: wound vac R foot Restrictions Weight Bearing Restrictions: Yes Other Position/Activity Restrictions: NWB       Mobility Bed Mobility Overal bed mobility: Independent                Transfers Overall transfer level: Needs assistance Equipment used: Rolling walker (2 wheeled) Transfers: Sit to/from [employer] Transfers Sit to Stand: Min guard;Supervision Stand pivot transfers: Supervision            Balance Overall balance assessment: Needs assistance Sitting-balance support: Feet supported Sitting balance-Leahy Scale: Good     Standing balance support: Bilateral upper extremity supported;During functional activity Standing balance-Leahy Scale: Poor                             ADL either performed or assessed with clinical judgement   ADL Overall ADL's : Needs assistance/impaired     Grooming: Wash/dry hands;Wash/dry face;Min guard;Standing   Upper Body Bathing: Sitting;Set up;Supervision/ safety Upper Body Bathing Details (indicate cue type and reason): simulated Lower Body Bathing: Minimal assistance;Sitting/lateral  leans;Sit to/from stand Lower Body Bathing Details (indicate cue type and reason): simulated Upper Body Dressing : Set up;Sitting       Toilet Transfer: RW;Min guard;Supervision/safety;Ambulation;Comfort height toilet;Grab bars   Toileting- Clothing Manipulation and Hygiene: Minimal assistance;Sit to/from stand       Functional mobility during ADLs: Rolling walker;Min guard;Supervision/safety       Vision Patient Visual Report: No change from baseline     Perception     Praxis      Cognition Arousal/Alertness: Awake/alert Behavior During Therapy: WFL for tasks assessed/performed Overall Cognitive Status: Within Functional Limits for tasks assessed                                          Exercises     Shoulder Instructions       General Comments      Pertinent Vitals/ Pain       Pain Assessment: 0-10 Pain Score: 2  Pain Location: R foot  Pain Descriptors / Indicators: Discomfort;Sore Pain Intervention(s): Monitored during session;Repositioned  Home Living Family/patient expects to be discharged to:: Private residence Living Arrangements: Alone Available Help at Discharge: Friend(s);Available 24 hours/day Type of Home: House Home Access: Level entry     Home Layout: One level                          Prior Functioning/Environment  Frequency  Min 2X/week        Progress Toward Goals  OT Goals(current goals can now be found in the care plan section)  Progress towards OT goals: Progressing toward goals     Plan Discharge plan remains appropriate    Co-evaluation                 AM-PAC PT "6 Clicks" Daily Activity     Outcome Measure   Help from another person eating meals?: None Help from another person taking care of personal grooming?: A Little Help from another person toileting, which includes using toliet, bedpan, or urinal?: A Little Help from another person bathing (including  washing, rinsing, drying)?: A Little Help from another person to put on and taking off regular upper body clothing?: A Little Help from another person to put on and taking off regular lower body clothing?: A Little 6 Click Score: 19    End of Session Equipment Utilized During Treatment: Gait belt;Rolling walker;Other (comment)(3 in 1 over toilet)  OT Visit Diagnosis: Unsteadiness on feet (R26.81);Other abnormalities of gait and mobility (R26.89);Muscle weakness (generalized) (M62.81);Pain Pain - Right/Left: Right Pain - part of body: Ankle and joints of foot   Activity Tolerance Patient tolerated treatment well   Patient Left in chair;with call bell/phone within reach   Nurse Communication      Functional Assessment Tool Used: AM-PAC 6 Clicks Daily Activity   Time: 6203-5597 OT Time Calculation (min): 21 min  Charges: OT G-codes **NOT FOR INPATIENT CLASS** Functional Assessment Tool Used: AM-PAC 6 Clicks Daily Activity OT General Charges $OT Visit: 1 Visit OT Treatments $Self Care/Home Management : 8-22 mins     Britt Bottom 10/02/2017, 12:50 PM

## 2017-10-02 NOTE — Plan of Care (Signed)
  Problem: Health Behavior/Discharge Planning: Goal: Ability to manage health-related needs will improve 10/02/2017 1325 by Rance Muir, RN Outcome: Adequate for Discharge 10/02/2017 1024 by Rance Muir, RN Outcome: Progressing

## 2017-10-02 NOTE — Care Management Note (Signed)
Case Management Note  Patient Details  Name: Brittany [name] MRN: [id number] Date of Birth: [birth year]  Action/Plan: CM following for progression of care; DCP - home with [facility] services provided by [place] [facility]; also pt will go home on wound vac; CM talked to [facility] with [facility], paperwork has been faxed out to the insurance co for approval/ verification.  Expected Discharge Date:  10/02/17               Expected Discharge Plan:  [place]  Discharge planning Services  CM Consult  Post Acute Care Choice:  Durable Medical Equipment, Home Health Choice offered to:     DME Arranged:  (S) Walker rolling, Vac, Crutches(has 3in1) DME Agency:  [place]:  RN, PT [facility] Agency:  [place]  Status of Service:  Completed, signed off  Sherrilyn Rist [phone] 10/02/2017, 10:13 AM

## 2017-10-02 NOTE — Care Management Important Message (Signed)
Important Message  Patient Details  Name: Brittany [name] MRN: [id number] Date of Birth: 12-Jan-1946   Medicare Important Message Given:  Yes    Orbie Pyo 10/02/2017, 2:35 PM

## 2017-10-02 NOTE — Progress Notes (Signed)
Patient has been approved for wound VAC; [facility] has wound vac for home; CM informed RN that wound vac will be changed to [facility] wound vac prior to discharge. Dan with [facility] is at the bedside; Aneta Mins [phone]

## 2017-10-03 ENCOUNTER — Telehealth: Payer: Self-pay

## 2017-10-03 NOTE — Telephone Encounter (Signed)
10/03/17   Transition Care Management Follow-up Telephone Call  ADMISSION DATE: 09/29/17  DISCHARGE DATE: 10/02/17   How have you been since you were released from the hospital? Doing ok per patient Do you understand why you were in the hospital? Yes, great toe amputation.    Do you understand the discharge instrcutions? Yes per patient.  Items Reviewed:  Medications reviewed: Yes   Allergies reviewed: Yes  Dietary changes reviewed: Diabetic diet   Referrals reviewed: Appointment scheduled with Dr..Lowne-Chase. Patient also has follow up appointment scheduled with Dr. Erlinda Hong.   Functional Questionnaire:   Activities of Daily Living (ADLs): Patient states she is able to perform all independently.  Any patient concerns? Not at this time.   Confirmed importance and date/time of follow-up visits scheduled:Yes    Confirmed with patient if condition begins to worsen call PCP or go to the ER. Yes     Patient was given the office number and encouragred to call back with questions or concerns. Yes

## 2017-10-04 DIAGNOSIS — L97511 Non-pressure chronic ulcer of other part of right foot limited to breakdown of skin: Secondary | ICD-10-CM | POA: Diagnosis not present

## 2017-10-04 DIAGNOSIS — L02611 Cutaneous abscess of right foot: Secondary | ICD-10-CM | POA: Diagnosis not present

## 2017-10-04 DIAGNOSIS — Z981 Arthrodesis status: Secondary | ICD-10-CM | POA: Diagnosis not present

## 2017-10-04 DIAGNOSIS — E11649 Type 2 diabetes mellitus with hypoglycemia without coma: Secondary | ICD-10-CM | POA: Diagnosis not present

## 2017-10-04 DIAGNOSIS — E11621 Type 2 diabetes mellitus with foot ulcer: Secondary | ICD-10-CM | POA: Diagnosis not present

## 2017-10-04 DIAGNOSIS — L03115 Cellulitis of right lower limb: Secondary | ICD-10-CM | POA: Diagnosis not present

## 2017-10-04 DIAGNOSIS — E1140 Type 2 diabetes mellitus with diabetic neuropathy, unspecified: Secondary | ICD-10-CM | POA: Diagnosis not present

## 2017-10-04 DIAGNOSIS — L02415 Cutaneous abscess of right lower limb: Secondary | ICD-10-CM | POA: Diagnosis not present

## 2017-10-04 DIAGNOSIS — I10 Essential (primary) hypertension: Secondary | ICD-10-CM | POA: Diagnosis not present

## 2017-10-04 DIAGNOSIS — Z7984 Long term (current) use of oral hypoglycemic drugs: Secondary | ICD-10-CM | POA: Diagnosis not present

## 2017-10-06 ENCOUNTER — Telehealth (INDEPENDENT_AMBULATORY_CARE_PROVIDER_SITE_OTHER): Payer: Self-pay | Admitting: Orthopaedic Surgery

## 2017-10-06 DIAGNOSIS — T8189XA Other complications of procedures, not elsewhere classified, initial encounter: Secondary | ICD-10-CM | POA: Diagnosis not present

## 2017-10-06 NOTE — Telephone Encounter (Signed)
Patient states [place] [facility] was to come out an change dressing M/W/F. They came out on Friday but did not come out today 10/06/17, per patient they states they did not get order from Dr Erlinda Hong

## 2017-10-07 DIAGNOSIS — E1169 Type 2 diabetes mellitus with other specified complication: Secondary | ICD-10-CM | POA: Diagnosis not present

## 2017-10-07 DIAGNOSIS — L02611 Cutaneous abscess of right foot: Secondary | ICD-10-CM | POA: Diagnosis not present

## 2017-10-07 DIAGNOSIS — L97511 Non-pressure chronic ulcer of other part of right foot limited to breakdown of skin: Secondary | ICD-10-CM | POA: Diagnosis not present

## 2017-10-07 DIAGNOSIS — Z981 Arthrodesis status: Secondary | ICD-10-CM | POA: Diagnosis not present

## 2017-10-07 DIAGNOSIS — E11621 Type 2 diabetes mellitus with foot ulcer: Secondary | ICD-10-CM | POA: Diagnosis not present

## 2017-10-07 DIAGNOSIS — L03115 Cellulitis of right lower limb: Secondary | ICD-10-CM | POA: Diagnosis not present

## 2017-10-07 DIAGNOSIS — Z7984 Long term (current) use of oral hypoglycemic drugs: Secondary | ICD-10-CM | POA: Diagnosis not present

## 2017-10-07 DIAGNOSIS — I10 Essential (primary) hypertension: Secondary | ICD-10-CM | POA: Diagnosis not present

## 2017-10-07 DIAGNOSIS — M86171 Other acute osteomyelitis, right ankle and foot: Secondary | ICD-10-CM | POA: Diagnosis not present

## 2017-10-07 DIAGNOSIS — E11649 Type 2 diabetes mellitus with hypoglycemia without coma: Secondary | ICD-10-CM | POA: Diagnosis not present

## 2017-10-07 DIAGNOSIS — E1140 Type 2 diabetes mellitus with diabetic neuropathy, unspecified: Secondary | ICD-10-CM | POA: Diagnosis not present

## 2017-10-07 DIAGNOSIS — L02415 Cutaneous abscess of right lower limb: Secondary | ICD-10-CM | POA: Diagnosis not present

## 2017-10-07 DIAGNOSIS — Z4781 Encounter for orthopedic aftercare following surgical amputation: Secondary | ICD-10-CM | POA: Diagnosis not present

## 2017-10-07 DIAGNOSIS — Z89411 Acquired absence of right great toe: Secondary | ICD-10-CM | POA: Diagnosis not present

## 2017-10-07 NOTE — Telephone Encounter (Signed)
She called this morning, approved orders

## 2017-10-10 ENCOUNTER — Telehealth: Payer: Self-pay | Admitting: Family Medicine

## 2017-10-10 DIAGNOSIS — E1169 Type 2 diabetes mellitus with other specified complication: Secondary | ICD-10-CM | POA: Diagnosis not present

## 2017-10-10 DIAGNOSIS — L02415 Cutaneous abscess of right lower limb: Secondary | ICD-10-CM | POA: Diagnosis not present

## 2017-10-10 DIAGNOSIS — M86171 Other acute osteomyelitis, right ankle and foot: Secondary | ICD-10-CM | POA: Diagnosis not present

## 2017-10-10 DIAGNOSIS — E11621 Type 2 diabetes mellitus with foot ulcer: Secondary | ICD-10-CM | POA: Diagnosis not present

## 2017-10-10 DIAGNOSIS — L03115 Cellulitis of right lower limb: Secondary | ICD-10-CM | POA: Diagnosis not present

## 2017-10-10 DIAGNOSIS — Z4781 Encounter for orthopedic aftercare following surgical amputation: Secondary | ICD-10-CM | POA: Diagnosis not present

## 2017-10-10 DIAGNOSIS — L02611 Cutaneous abscess of right foot: Secondary | ICD-10-CM | POA: Diagnosis not present

## 2017-10-10 DIAGNOSIS — I10 Essential (primary) hypertension: Secondary | ICD-10-CM | POA: Diagnosis not present

## 2017-10-10 DIAGNOSIS — Z89411 Acquired absence of right great toe: Secondary | ICD-10-CM | POA: Diagnosis not present

## 2017-10-10 DIAGNOSIS — L97511 Non-pressure chronic ulcer of other part of right foot limited to breakdown of skin: Secondary | ICD-10-CM | POA: Diagnosis not present

## 2017-10-10 NOTE — Telephone Encounter (Signed)
Copied from [place] [id number]. Topic: Inquiry >> Oct 10, 2017 11:23 AM Scherrie Gerlach wrote: Reason for CRM: Donnita with [facility] calling for verbal for home health PT to evaluate pt.

## 2017-10-12 DIAGNOSIS — G629 Polyneuropathy, unspecified: Secondary | ICD-10-CM | POA: Diagnosis not present

## 2017-10-12 DIAGNOSIS — L0291 Cutaneous abscess, unspecified: Secondary | ICD-10-CM | POA: Diagnosis not present

## 2017-10-12 DIAGNOSIS — L0390 Cellulitis, unspecified: Secondary | ICD-10-CM | POA: Diagnosis not present

## 2017-10-14 ENCOUNTER — Encounter (INDEPENDENT_AMBULATORY_CARE_PROVIDER_SITE_OTHER): Payer: Self-pay | Admitting: Orthopaedic Surgery

## 2017-10-14 ENCOUNTER — Ambulatory Visit (INDEPENDENT_AMBULATORY_CARE_PROVIDER_SITE_OTHER): Payer: Medicare HMO | Admitting: Orthopaedic Surgery

## 2017-10-14 DIAGNOSIS — Z89411 Acquired absence of right great toe: Secondary | ICD-10-CM

## 2017-10-14 NOTE — Progress Notes (Signed)
Post-Op Visit Note   Patient: Brittany [name]           Date of Birth: [birth year]           MRN: [id number] Visit Date: 10/14/2017 PCP: Ann Held, DO   Assessment & Plan:  Chief Complaint:  Chief Complaint  Patient presents with  . Right Foot - Routine Post Op    09/29/17 right first ray amputation, wound vac   Visit Diagnoses:  1. 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  15 days status post amputation right first ray, date of surgery 09/29/2017.  She has been weightbearing as tolerated with with her wound VAC since being discharged home from the hospital.  She does note that there is an air leak in her wound VAC over the weekend and she went 2 days without using the [facility] due to this and the fact that [facility] did not have anyone that could come out and change this until last night.  She has mild to moderate pain.  No fevers, chills or any other systemic symptoms.  She states that she has been up on her feet only when necessary.  She has been elevating at waist level.  Examination of her right foot reveals moderate erythema with nylon sutures in place.  Minimal tenderness.  No purulent drainage.  Beefy red tissue.  Today, nylon sutures were removed.  Follow-Up Instructions: No follow-ups on file.   Orders:  No orders of the defined types were placed in this encounter.  No orders of the defined types were placed in this encounter.   Imaging: No results found.  PMFS History: Patient Active Problem List   Diagnosis Date Noted  . 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 Osteomyelitis of foot, right, acute ([place]) 09/29/2017  . Osteomyelitis of great toe of right foot ([place]) 09/25/2017  . Pain in right foot 09/16/2017  . Open wound of right foot 08/15/2017  . Cellulitis 08/08/2017  . Hypoglycemia without diagnosis of diabetes mellitus 08/08/2017  .  Peripheral neuropathy 08/08/2017  . Abscess 08/08/2017  . Hyperlipidemia LDL goal <70 09/22/2016  . Tooth pain 09/03/2012  . Edema 09/03/2012  . Supraclavicular fossa fullness 06/18/2012  . Hair loss 09/18/2010  . Fatigue 09/18/2010  . DYSPEPSIA&OTHER [facility] DISORDERS FUNCTION STOMACH 05/08/2010  . DIARRHEA 05/08/2010  . DIZZINESS 04/19/2010  . OTHER ACUTE SINUSITIS 02/28/2010  . NAUSEA 02/28/2010  . KNEE PAIN, LEFT 02/15/2010  . URI 07/09/2007  . GERD 02/12/2007  . LOW BACK PAIN 02/12/2007  . Essential hypertension 09/03/2006  . HOT FLASHES 09/03/2006  . INSOMNIA 09/03/2006   Past Medical History:  Diagnosis Date  . Anemia    as a younger woman  . Arthritis    "all over my body" (10/01/2017)  . Chronic pain    In pain clinic   . Diabetic peripheral neuropathy ([place])   . GERD (gastroesophageal reflux disease)   . High cholesterol   . History of hiatal hernia   . Hypertension   . Type II diabetes mellitus ([place]) dx'd ~ 2008    Family History  Problem Relation Age of Onset  . Cancer Mother        breast  . Depression Mother        bipolar  . Breast cancer Mother 81  . Heart disease Father 52  MI  . Diabetes Brother   . Kidney disease Brother   . Hypertension Brother   . Hyperlipidemia Brother   . Stroke Brother   . Coronary artery disease Unknown   . Diabetes Unknown     Past Surgical History:  Procedure Laterality Date  . AMPUTATION Right 09/29/2017   Procedure: AMPUTATION RIGHT 1ST RAY;  Surgeon: Leandrew Koyanagi, MD;  Location: [facility];  Service: Orthopedics;  Laterality: Right;  . APPLICATION OF WOUND VAC Right 09/29/2017   "foot"  . BACK SURGERY    . COLONOSCOPY    . I&D EXTREMITY Right 08/10/2017   Procedure: IRRIGATION AND DEBRIDEMENT FOOT;  Surgeon: Leandrew Koyanagi, MD;  Location: [facility];  Service: Orthopedics;  Laterality: Right;  . [place]  . POSTERIOR LUMBAR FUSION  1996  . TOE SURGERY Right 1980s   bone removed "inbetween my toes"  .  VAGINAL HYSTERECTOMY     Social History   Occupational History  . Not on file  Tobacco Use  . Smoking status: Never Smoker  . Smokeless tobacco: Never Used  Substance and Sexual Activity  . Alcohol use: Never    Frequency: Never  . Drug use: Never  . Sexual activity: Not Currently    Partners: Male

## 2017-10-16 ENCOUNTER — Encounter: Payer: Self-pay | Admitting: Family Medicine

## 2017-10-16 ENCOUNTER — Ambulatory Visit (INDEPENDENT_AMBULATORY_CARE_PROVIDER_SITE_OTHER): Payer: Medicare HMO | Admitting: Family Medicine

## 2017-10-16 VITALS — BP 98/50 | HR 66 | Temp 98.5°F | Resp 16 | Ht 69.0 in | Wt 171.6 lb

## 2017-10-16 DIAGNOSIS — R69 Illness, unspecified: Secondary | ICD-10-CM | POA: Diagnosis not present

## 2017-10-16 DIAGNOSIS — E1169 Type 2 diabetes mellitus with other specified complication: Secondary | ICD-10-CM | POA: Diagnosis not present

## 2017-10-16 DIAGNOSIS — Z780 Asymptomatic menopausal state: Secondary | ICD-10-CM | POA: Insufficient documentation

## 2017-10-16 DIAGNOSIS — K219 Gastro-esophageal reflux disease without esophagitis: Secondary | ICD-10-CM | POA: Diagnosis not present

## 2017-10-16 DIAGNOSIS — E1110 Type 2 diabetes mellitus with ketoacidosis without coma: Secondary | ICD-10-CM | POA: Diagnosis not present

## 2017-10-16 DIAGNOSIS — F411 Generalized anxiety disorder: Secondary | ICD-10-CM | POA: Diagnosis not present

## 2017-10-16 DIAGNOSIS — M869 Osteomyelitis, unspecified: Secondary | ICD-10-CM

## 2017-10-16 DIAGNOSIS — E785 Hyperlipidemia, unspecified: Secondary | ICD-10-CM | POA: Diagnosis not present

## 2017-10-16 DIAGNOSIS — E1165 Type 2 diabetes mellitus with hyperglycemia: Secondary | ICD-10-CM

## 2017-10-16 DIAGNOSIS — I10 Essential (primary) hypertension: Secondary | ICD-10-CM | POA: Diagnosis not present

## 2017-10-16 DIAGNOSIS — E1151 Type 2 diabetes mellitus with diabetic peripheral angiopathy without gangrene: Secondary | ICD-10-CM | POA: Insufficient documentation

## 2017-10-16 DIAGNOSIS — IMO0002 Reserved for concepts with insufficient information to code with codable children: Secondary | ICD-10-CM | POA: Insufficient documentation

## 2017-10-16 DIAGNOSIS — E119 Type 2 diabetes mellitus without complications: Secondary | ICD-10-CM | POA: Insufficient documentation

## 2017-10-16 LAB — COMPREHENSIVE METABOLIC PANEL
ALT: 11 U/L (ref 0–35)
AST: 14 U/L (ref 0–37)
Albumin: 3.6 g/dL (ref 3.5–5.2)
Alkaline Phosphatase: 57 U/L (ref 39–117)
BILIRUBIN TOTAL: 0.3 mg/dL (ref 0.2–1.2)
BUN: 17 mg/dL (ref 6–23)
CO2: 37 meq/L — AB (ref 19–32)
Calcium: 9.4 mg/dL (ref 8.4–10.5)
Chloride: 98 mEq/L (ref 96–112)
Creatinine, Ser: 0.84 mg/dL (ref 0.40–1.20)
GFR: 71.33 mL/min (ref >60.00)
GLUCOSE: 94 mg/dL (ref 70–99)
POTASSIUM: 4.5 meq/L (ref 3.5–5.1)
SODIUM: 141 meq/L (ref 135–145)
TOTAL PROTEIN: 6.8 g/dL (ref 6.0–8.3)

## 2017-10-16 LAB — LIPID PANEL
CHOL/HDL RATIO: 2
Cholesterol: 153 mg/dL (ref 0–200)
HDL: 71.3 mg/dL (ref >39.00)
LDL Cholesterol: 65 mg/dL (ref 0–99)
NONHDL: 81.32
Triglycerides: 82 mg/dL (ref 0.0–149.0)
VLDL: 16.4 mg/dL (ref 0.0–40.0)

## 2017-10-16 MED ORDER — SITAGLIPTIN PHOS-METFORMIN HCL 50-1000 MG PO TABS: ORAL_TABLET | ORAL | 1 refills | Status: DC

## 2017-10-16 MED ORDER — AMLODIPINE BESYLATE 5 MG PO TABS: 5.0000 mg | ORAL_TABLET | Freq: Every day | ORAL | 1 refills | Status: DC

## 2017-10-16 MED ORDER — GABAPENTIN 800 MG PO TABS: 800.0000 mg | ORAL_TABLET | Freq: Four times a day (QID) | ORAL | 3 refills | Status: DC

## 2017-10-16 MED ORDER — OMEPRAZOLE 40 MG PO CPDR: 40.0000 mg | DELAYED_RELEASE_CAPSULE | Freq: Every day | ORAL | 1 refills | Status: DC

## 2017-10-16 MED ORDER — ESCITALOPRAM OXALATE 10 MG PO TABS: 10.0000 mg | ORAL_TABLET | Freq: Every day | ORAL | 1 refills | Status: DC

## 2017-10-16 MED ORDER — BISOPROLOL-HYDROCHLOROTHIAZIDE 10-6.25 MG PO TABS: 1.0000 | ORAL_TABLET | Freq: Every day | ORAL | 1 refills | Status: DC

## 2017-10-16 MED ORDER — ATORVASTATIN CALCIUM 10 MG PO TABS: 10.0000 mg | ORAL_TABLET | Freq: Every day | ORAL | 1 refills | Status: DC

## 2017-10-16 MED ORDER — ESTRADIOL 1 MG PO TABS: 1.0000 mg | ORAL_TABLET | Freq: Every day | ORAL | 1 refills | Status: DC

## 2017-10-16 MED ORDER — KLOR-CON M20 20 MEQ PO TBCR: 20.0000 meq | EXTENDED_RELEASE_TABLET | Freq: Every day | ORAL | 3 refills | Status: DC

## 2017-10-16 NOTE — Assessment & Plan Note (Signed)
Tolerating statin, encouraged heart healthy diet, avoid trans fats, minimize simple carbs and saturated fats. Increase exercise as tolerated 

## 2017-10-16 NOTE — Assessment & Plan Note (Signed)
hgba1c to be checked minimize simple carbs. Increase exercise as tolerated. Continue current meds 

## 2017-10-16 NOTE — Telephone Encounter (Signed)
Verbal given to [facility]

## 2017-10-16 NOTE — Assessment & Plan Note (Signed)
stable °

## 2017-10-16 NOTE — Progress Notes (Signed)
Patient ID: Brittany [name], female   DOB: 12/21/1945, 72 y.o.   MRN: [id number]    Subjective:  I acted as a Education administrator for Dr. Carollee Herter.  Guerry Bruin, [place]   Patient ID: Brittany [name], female    DOB: 1945-09-16, 72 y.o.   MRN: [id number]  Chief Complaint  Patient presents with  . Hospitalization Follow-up    09/29/17-10/02/17    HPI  Patient is in today for hospital follow up right great toe amputation. And f/u dm , chol and htn.  Pt is doing well since d/c -- sees Dr Mechele Claude for her foot/ toe.  Still has wound vac.    HPI HYPERTENSION   Blood pressure range-not checking   Chest pain- no      Dyspnea- no Lightheadedness- no   Edema- no  Other side effects - no   Medication compliance: good Low salt diet- yes    DIABETES    Blood Sugar ranges-not checking   Polyuria- no New Visual problems- no  Hypoglycemic symptoms- no  Other side effects-no Medication compliance - yes Foot exam- per ortho--  R foot big toe amp--   HYPERLIPIDEMIA  Medication compliance- good RUQ pain- no  Muscle aches- no Other side effects-no   Patient Care Team: Ann Held, DO as PCP - General Nicholaus Bloom, MD as Consulting Physician (Neurology) Milus Banister, MD as Attending Physician (Gastroenterology) Leandrew Koyanagi, MD as Attending Physician (Orthopedic Surgery)   Past Medical History:  Diagnosis Date  . Anemia    as a younger woman  . Arthritis    "all over my body" (10/01/2017)  . Chronic pain    In pain clinic   . Diabetic peripheral neuropathy ([place])   . GERD (gastroesophageal reflux disease)   . High cholesterol   . History of hiatal hernia   . Hypertension   . Type II diabetes mellitus ([place]) dx'd ~ 2008    Past Surgical History:  Procedure Laterality Date  . AMPUTATION Right 09/29/2017   Procedure: AMPUTATION RIGHT 1ST RAY;  Surgeon: Leandrew Koyanagi, MD;  Location: [facility];  Service: Orthopedics;  Laterality: Right;  . APPLICATION OF WOUND VAC Right 09/29/2017   "foot"  .  BACK SURGERY    . COLONOSCOPY    . I&D EXTREMITY Right 08/10/2017   Procedure: IRRIGATION AND DEBRIDEMENT FOOT;  Surgeon: Leandrew Koyanagi, MD;  Location: [facility];  Service: Orthopedics;  Laterality: Right;  . [place]  . POSTERIOR LUMBAR FUSION  1996  . TOE SURGERY Right 1980s   bone removed "inbetween my toes"  . VAGINAL HYSTERECTOMY      Family History  Problem Relation Age of Onset  . Cancer Mother        breast  . Depression Mother        bipolar  . Breast cancer Mother 68  . Heart disease Father 36       MI  . Diabetes Brother   . Kidney disease Brother   . Hypertension Brother   . Hyperlipidemia Brother   . Stroke Brother   . Coronary artery disease Unknown   . Diabetes Unknown     Social History   Socioeconomic History  . Marital status: Widowed    Spouse name: Not on file  . Number of children: Not on file  . Years of education: Not on file  . Highest education level: Not on file  Occupational History  . Not on file  Social Needs  . Financial resource strain: Not on file  . Food insecurity:    Worry: Not on file    Inability: Not on file  . Transportation needs:    Medical: Not on file    Non-medical: Not on file  Tobacco Use  . Smoking status: Never Smoker  . Smokeless tobacco: Never Used  Substance and Sexual Activity  . Alcohol use: Never    Frequency: Never  . Drug use: Never  . Sexual activity: Not Currently    Partners: Male  Lifestyle  . Physical activity:    Days per week: Not on file    Minutes per session: Not on file  . Stress: Not on file  Relationships  . Social connections:    Talks on phone: Not on file    Gets together: Not on file    Attends religious service: Not on file    Active member of club or organization: Not on file    Attends meetings of clubs or organizations: Not on file    Relationship status: Not on file  . Intimate partner violence:    Fear of current or ex partner: Not on file    Emotionally  abused: Not on file    Physically abused: Not on file    Forced sexual activity: Not on file  Other Topics Concern  . Not on file  Social History Narrative  . Not on file    Outpatient Medications Prior to Visit  Medication Sig Dispense Refill  . aspirin 81 MG tablet Take 81 mg by mouth at bedtime.     . Blood Glucose Monitoring Suppl (ONE TOUCH ULTRA MINI) w/Device KIT Use as directed once a day.  Dx Code:  E11.42 1 each 0  . calcium carbonate (TUMS EX) 750 MG chewable tablet Chew 1 tablet by mouth daily as needed for heartburn.     . Cholecalciferol (VITAMIN D) 1000 UNITS capsule Take 1,000 Units by mouth daily.    . collagenase (SANTYL) ointment Apply 1 application topically daily. 15 g 2  . docusate sodium (COLACE) 100 MG capsule Take 100 mg by mouth 2 (two) times daily.    . Ferrous Sulfate 27 MG TABS Take 27 mg by mouth daily.    . folic acid (FOLVITE) 656 MCG tablet Take 800 mcg by mouth daily.     . furosemide (LASIX) 40 MG tablet Take 40 mg by mouth daily.     [name] glucose blood (ONE TOUCH ULTRA TEST) test strip Check Blood sugar once daily 100 each 12  . l-methylfolate-B6-B12 (METANX) 3-35-2 MG TABS Take 1 tablet by mouth daily.    [name] loratadine (CLARITIN) 10 MG tablet Take 10 mg by mouth daily as needed. For allergies    . morphine (MS CONTIN) 60 MG 12 hr tablet TK 1 T PO Q 8 H  0  . Multiple Vitamins-Minerals (MULTIVITAMIN GUMMIES WOMENS PO) Take 2 tablets by mouth daily.    [name] omega-3 acid ethyl esters (LOVAZA) 1 G capsule Take 1 g by mouth daily.    [name] DELICA LANCETS 81E MISC Check blood sugar once daily 100 each 12  . oxyCODONE-acetaminophen (PERCOCET) 5-325 MG tablet Take 1-2 tablets by mouth every 4 (four) hours as needed for severe pain. 30 tablet 0  . polyvinyl alcohol (LIQUIFILM TEARS) 1.4 % ophthalmic solution Place 1 drop into both eyes as needed for dry eyes. (Patient taking differently: Place 1 drop into both eyes 2 (two) times daily as  needed for dry eyes. ) 15  mL 0  . Specialty Vitamins Products (BIOTIN PLUS KERATIN) 10000-100 MCG-MG TABS Take 1 tablet by mouth daily.    [name] tiZANidine (ZANAFLEX) 4 MG tablet Take 4 mg by mouth 3 (three) times daily.     . vitamin B-12 (CYANOCOBALAMIN) 100 MCG tablet Take 100 mcg by mouth daily.    [name] amLODipine (NORVASC) 5 MG tablet TAKE 1 TABLET DAILY 90 tablet 1  . atorvastatin (LIPITOR) 10 MG tablet Take 1 tablet (10 mg total) by mouth daily. 90 tablet 0  . bisoprolol-hydrochlorothiazide (ZIAC) 10-6.25 MG tablet Take 1 tablet by mouth daily.    [name] escitalopram (LEXAPRO) 10 MG tablet Take 1 tablet (10 mg total) by mouth daily. 90 tablet 1  . estradiol (ESTRACE) 1 MG tablet TAKE 1 TABLET DAILY 90 tablet 1  . gabapentin (NEURONTIN) 800 MG tablet Take 1 tablet (800 mg total) by mouth 4 (four) times daily.    [name] KLOR-CON M20 20 MEQ tablet Take 20 mEq by mouth daily.     [name] omeprazole (PRILOSEC) 40 MG capsule TAKE 1 CAPSULE DAILY 90 capsule 1  . sitaGLIPtin-metformin (JANUMET) 50-1000 MG tablet Take 2 tablets po at bedtime daily 60 tablet 3   No facility-administered medications prior to visit.     Allergies  Allergen Reactions  . Sulfonamide Derivatives Hives    ROS     Objective:    Physical Exam  Constitutional: She is oriented to person, place, and time. She appears well-developed and well-nourished.  HENT:  Head: Normocephalic and atraumatic.  Eyes: Conjunctivae and EOM are normal.  Neck: Normal range of motion. Neck supple. No JVD present. Carotid bruit is not present. No thyromegaly present.  Cardiovascular: Normal rate, regular rhythm and normal heart sounds.  No murmur heard. Pulmonary/Chest: Effort normal and breath sounds normal. No respiratory distress. She has no wheezes. She has no rales. She exhibits no tenderness.  Musculoskeletal: She exhibits deformity. She exhibits no edema.  R big toe amputated  Bandage in place and wound vac in place  Neurological: She is alert and oriented to person,  place, and time.  Psychiatric: She has a normal mood and affect.    BP (!) 98/50   Pulse 66   Temp 98.5 F (36.9 C) (Oral)   Resp 16   Ht _0  (1.753 m)   Wt 171 lb 9.6 oz (77.8 kg)   SpO2 98%   BMI 25.34 kg/m  Wt Readings from Last 3 Encounters:  10/16/17 171 lb 9.6 oz (77.8 kg)  09/29/17 175 lb (79.4 kg)  08/08/17 172 lb (78 kg)   BP Readings from Last 3 Encounters:  10/16/17 (!) 98/50  10/02/17 137/62  08/12/17 (!) 132/54     Immunization History  Administered Date(s) Administered  . DTP 07/14/2002  . Influenza Whole 02/12/2007  . Influenza, High Dose Seasonal PF 02/11/2014, 02/10/2015, 02/10/2015  . Influenza,inj,Quad PF,6+ Mos 05/14/2013, 02/19/2016  . Pneumococcal Conjugate-13 05/04/2015  . Pneumococcal Polysaccharide-23 05/14/2013  . Zoster 01/02/2010    Health Maintenance  Topic Date Due  . FOOT EXAM  09/20/2017  . TETANUS/TDAP  06/14/2023 (Originally 03/25/1967)  . INFLUENZA VACCINE  11/06/2017  . OPHTHALMOLOGY EXAM  11/27/2017  . HEMOGLOBIN A1C  02/08/2018  . MAMMOGRAM  05/01/2018  . URINE MICROALBUMIN  05/15/2018  . COLONOSCOPY  06/11/2020  . DEXA SCAN  Completed  . Hepatitis C Screening  Completed  . PNA vac Low Risk Adult  Completed  Lab Results  Component Value Date   WBC 10.4 09/29/2017   HGB 11.4 (L) 09/29/2017   HCT 36.3 09/29/2017   PLT 243 09/29/2017   GLUCOSE 94 10/16/2017   CHOL 153 10/16/2017   TRIG 82.0 10/16/2017   HDL 71.30 10/16/2017   LDLCALC 65 10/16/2017   ALT 11 10/16/2017   AST 14 10/16/2017   NA 141 10/16/2017   K 4.5 10/16/2017   CL 98 10/16/2017   CREATININE 0.84 10/16/2017   BUN 17 10/16/2017   CO2 37 (H) 10/16/2017   TSH 1.39 05/15/2017   HGBA1C 6.2 (H) 08/08/2017   MICROALBUR 1.2 05/15/2017    Lab Results  Component Value Date   TSH 1.39 05/15/2017   Lab Results  Component Value Date   WBC 10.4 09/29/2017   HGB 11.4 (L) 09/29/2017   HCT 36.3 09/29/2017   MCV 87.1 09/29/2017   PLT 243 09/29/2017    Lab Results  Component Value Date   NA 141 10/16/2017   K 4.5 10/16/2017   CO2 37 (H) 10/16/2017   GLUCOSE 94 10/16/2017   BUN 17 10/16/2017   CREATININE 0.84 10/16/2017   BILITOT 0.3 10/16/2017   ALKPHOS 57 10/16/2017   AST 14 10/16/2017   ALT 11 10/16/2017   PROT 6.8 10/16/2017   ALBUMIN 3.6 10/16/2017   CALCIUM 9.4 10/16/2017   ANIONGAP 10 09/29/2017   GFR 71.33 10/16/2017   Lab Results  Component Value Date   CHOL 153 10/16/2017   Lab Results  Component Value Date   HDL 71.30 10/16/2017   Lab Results  Component Value Date   LDLCALC 65 10/16/2017   Lab Results  Component Value Date   TRIG 82.0 10/16/2017   Lab Results  Component Value Date   CHOLHDL 2 10/16/2017   Lab Results  Component Value Date   HGBA1C 6.2 (H) 08/08/2017         Assessment & Plan:   Problem List Items Addressed This Visit      Unprioritized   Generalized anxiety disorder    stable      Relevant Medications   escitalopram (LEXAPRO) 10 MG tablet   GERD - Primary   Relevant Medications   omeprazole (PRILOSEC) 40 MG capsule   Hyperlipidemia associated with type 2 diabetes mellitus (HCC)   Relevant Medications   sitaGLIPtin-metformin (JANUMET) 50-1000 MG tablet   amLODipine (NORVASC) 5 MG tablet   atorvastatin (LIPITOR) 10 MG tablet   bisoprolol-hydrochlorothiazide (ZIAC) 10-6.25 MG tablet   Other Relevant Orders   Lipid panel (Completed)   Comprehensive metabolic panel (Completed)   Hyperlipidemia LDL goal <70    Tolerating statin, encouraged heart healthy diet, avoid trans fats, minimize simple carbs and saturated fats. Increase exercise as tolerated      Relevant Medications   amLODipine (NORVASC) 5 MG tablet   atorvastatin (LIPITOR) 10 MG tablet   bisoprolol-hydrochlorothiazide (ZIAC) 10-6.25 MG tablet   Hypertension    Well controlled, no changes to meds. Encouraged heart healthy diet such as the DASH diet and exercise as tolerated.       Relevant Medications    amLODipine (NORVASC) 5 MG tablet   atorvastatin (LIPITOR) 10 MG tablet   bisoprolol-hydrochlorothiazide (ZIAC) 10-6.25 MG tablet   KLOR-CON M20 20 MEQ tablet   Menopause   Relevant Medications   estradiol (ESTRACE) 1 MG tablet   Osteomyelitis of great toe of right foot ([place])    Per ortho      Uncontrolled type 2 diabetes mellitus  with ketoacidosis without coma, without long-term current use of insulin ([place])    hgba1c to be checked  minimize simple carbs. Increase exercise as tolerated. Continue current meds      Relevant Medications   sitaGLIPtin-metformin (JANUMET) 50-1000 MG tablet   atorvastatin (LIPITOR) 10 MG tablet   bisoprolol-hydrochlorothiazide (ZIAC) 10-6.25 MG tablet   gabapentin (NEURONTIN) 800 MG tablet      I have changed Lelan Pons E. [place]estone's amLODipine, estradiol, KLOR-CON M20, and omeprazole. I am also having her maintain her tiZANidine, docusate sodium, calcium carbonate, omega-3 acid ethyl esters, Vitamin D, loratadine, [id number], folic acid, aspirin, ONETOUCH DELICA LANCETS 25K, glucose blood, ONE TOUCH ULTRA MINI, polyvinyl alcohol, collagenase, furosemide, Multiple Vitamins-Minerals (MULTIVITAMIN GUMMIES WOMENS PO), vitamin B-12, Ferrous Sulfate, Biotin Plus Keratin, oxyCODONE-acetaminophen, morphine, sitaGLIPtin-metformin, atorvastatin, bisoprolol-hydrochlorothiazide, escitalopram, and gabapentin.  Meds ordered this encounter  Medications  . sitaGLIPtin-metformin (JANUMET) 50-1000 MG tablet    Sig: Take 2 tablets po at bedtime daily    Dispense:  180 tablet    Refill:  1  . amLODipine (NORVASC) 5 MG tablet    Sig: Take 1 tablet (5 mg total) by mouth daily.    Dispense:  90 tablet    Refill:  1  . atorvastatin (LIPITOR) 10 MG tablet    Sig: Take 1 tablet (10 mg total) by mouth daily.    Dispense:  90 tablet    Refill:  1  . bisoprolol-hydrochlorothiazide (ZIAC) 10-6.25 MG tablet    Sig: Take 1 tablet by mouth daily.    Dispense:  90 tablet     Refill:  1  . escitalopram (LEXAPRO) 10 MG tablet    Sig: Take 1 tablet (10 mg total) by mouth daily.    Dispense:  90 tablet    Refill:  1  . estradiol (ESTRACE) 1 MG tablet    Sig: Take 1 tablet (1 mg total) by mouth daily.    Dispense:  90 tablet    Refill:  1  . gabapentin (NEURONTIN) 800 MG tablet    Sig: Take 1 tablet (800 mg total) by mouth 4 (four) times daily.    Dispense:  360 tablet    Refill:  3  . KLOR-CON M20 20 MEQ tablet    Sig: Take 1 tablet (20 mEq total) by mouth daily.    Dispense:  90 tablet    Refill:  3  . omeprazole (PRILOSEC) 40 MG capsule    Sig: Take 1 capsule (40 mg total) by mouth daily.    Dispense:  90 capsule    Refill:  1    CMA served as scribe during this visit. History, Physical and Plan performed by medical provider. Documentation and orders reviewed and attested to.  Ann Held, DO

## 2017-10-16 NOTE — Assessment & Plan Note (Signed)
Per ortho.  

## 2017-10-16 NOTE — Assessment & Plan Note (Deleted)
.  sblipid 

## 2017-10-16 NOTE — Assessment & Plan Note (Signed)
Well controlled, no changes to meds. Encouraged heart healthy diet such as the DASH diet and exercise as tolerated.  °

## 2017-10-16 NOTE — Patient Instructions (Signed)
Toe Amputation Toe amputation is a surgical procedure to remove all or part of a toe. You may have this procedure if:  Tissue in your toe is dying because of poor blood supply.  You have a severe infection in your toe.  Removing your toe keeps nearby tissue healthy. If the toe is infected, removing it helps to keep the infection from spreading. Tell a health care provider about:  Any allergies you have.  All medicines you are taking, including vitamins, herbs, eye drops, creams, and over-the-counter medicines.  Any problems you or family members have had with anesthetic medicines.  Any blood disorders you have.  Any surgeries you have had.  Any medical conditions you have.  Whether you are pregnant or may be pregnant. What are the risks? Generally, this is a safe procedure. However, problems may occur, including:  Bleeding.  Buildup of blood and fluid (hematoma).  Infection.  Allergic reactions to medicines.  Tissue death in the flap of skin (flap necrosis).  Trouble with healing.  Minor changes in the way that you walk (your gait).  Feeling pain in the area that was removed (phantom pain). This is rare.  What happens before the procedure?  Follow instructions from your health care provider about eating or drinking restrictions.  Ask your health care provider about: ? Changing or stopping your regular medicines. This is especially important if you are taking diabetes medicines or blood thinners. ? Taking medicines such as aspirin and ibuprofen. These medicines can thin your blood. Do not take these medicines before your procedure if your health care provider instructs you not to.  You may be given antibiotic medicine to help prevent infection.  Plan to have someone take you home after the procedure.  If you will be going home right after the procedure, plan to have someone with you for 24 hours. What happens during the procedure?  You will be given one or more  of the following: ? A medicine to help you relax (sedative). ? A medicine to numb the area (local anesthetic). ? A medicine to make you fall asleep (general anesthetic). ? A medicine that is injected into your spine to numb the area below and slightly above the injection site (spinal anesthetic). ? A medicine that is injected into an area of your body to numb everything below the injection site (regional anesthetic).  To reduce your risk of infection: ? Your health care team will wash or sanitize their hands. ? Your skin will be washed with soap.  Your surgeon will mark the area of your toe for removal.  Your surgeon will make a surgical cut (incision) in your toe.  The dead tissue and bone will be removed.  Nerves and vessels will be tied or heated with a special tool to stop bleeding.  The area will be drained and cleaned.  If only part of a toe is removed, the remaining part will be covered with a flap of skin.  The incision will be treated in one of these ways: ? It will be closed with stitches (sutures). ? It will be left open to heal if there is an infection.  The incision area may be packed with gauze and covered with bandages (dressings).  Tissue samples may be sent to a lab to be examined under a microscope. The procedure may vary among health care providers and hospitals. What happens after the procedure?  Your blood pressure, heart rate, breathing rate, and blood oxygen level will be monitored  often until the medicines you were given have worn off.  Your foot will be raised up high (elevated) to relieve swelling.  You will be monitored for pain.  You will be given pain medicines and antibiotics.  Your health care provider or physical therapist will help you to move around as soon as possible.  Do not drive for 24 hours if you received a sedative. This information is not intended to replace advice given to you by your health care provider. Make sure you discuss  any questions you have with your health care provider. Document Released: 03/06/2015 Document Revised: 11/27/2015 Document Reviewed: 12/17/2014 Elsevier Interactive Patient Education  [employer].

## 2017-10-17 ENCOUNTER — Telehealth (INDEPENDENT_AMBULATORY_CARE_PROVIDER_SITE_OTHER): Payer: Self-pay | Admitting: Radiology

## 2017-10-17 ENCOUNTER — Other Ambulatory Visit: Payer: Self-pay | Admitting: Family Medicine

## 2017-10-17 ENCOUNTER — Telehealth: Payer: Self-pay | Admitting: Family Medicine

## 2017-10-17 DIAGNOSIS — L03115 Cellulitis of right lower limb: Secondary | ICD-10-CM | POA: Diagnosis not present

## 2017-10-17 DIAGNOSIS — E11649 Type 2 diabetes mellitus with hypoglycemia without coma: Secondary | ICD-10-CM | POA: Diagnosis not present

## 2017-10-17 DIAGNOSIS — E11621 Type 2 diabetes mellitus with foot ulcer: Secondary | ICD-10-CM | POA: Diagnosis not present

## 2017-10-17 DIAGNOSIS — I10 Essential (primary) hypertension: Secondary | ICD-10-CM | POA: Diagnosis not present

## 2017-10-17 DIAGNOSIS — L02415 Cutaneous abscess of right lower limb: Secondary | ICD-10-CM | POA: Diagnosis not present

## 2017-10-17 DIAGNOSIS — L02611 Cutaneous abscess of right foot: Secondary | ICD-10-CM | POA: Diagnosis not present

## 2017-10-17 DIAGNOSIS — L97511 Non-pressure chronic ulcer of other part of right foot limited to breakdown of skin: Secondary | ICD-10-CM | POA: Diagnosis not present

## 2017-10-17 DIAGNOSIS — Z7984 Long term (current) use of oral hypoglycemic drugs: Secondary | ICD-10-CM | POA: Diagnosis not present

## 2017-10-17 DIAGNOSIS — E1140 Type 2 diabetes mellitus with diabetic neuropathy, unspecified: Secondary | ICD-10-CM | POA: Diagnosis not present

## 2017-10-17 DIAGNOSIS — Z981 Arthrodesis status: Secondary | ICD-10-CM | POA: Diagnosis not present

## 2017-10-17 NOTE — Telephone Encounter (Signed)
Donita RN with [facility] called to the triage line and said that the patient's foot was red and swollen, and she is concerned.  I also s/w patient, and per patient report it is no more swollen and red than when she was in the office on 10/14/17.  Patient has a temp of 99.1, and denies any fever or chills.  I advised HHRN to change wound vac today, and I gave verbal auth for 60 days wound vac/care thru [facility].  I asked RN to advise patient to go to [facility] ED if she develops any fever or chills.  HHRN will be out Monday again to change the wound vac.  I have asked her to have that RN call us to update if needed.  Dr. Erlinda Hong advised as well.

## 2017-10-17 NOTE — Telephone Encounter (Unsigned)
Copied from [place] [id number]. Topic: Quick Communication - See Telephone Encounter >> Oct 17, 2017 11:53 AM Neva Seat wrote: Charlynn Grimes w/ [place] - [phone]  Need Verbal orders:  Recertification to continue to see for the next 60 days  3 times a wound vac for dressing her foot.

## 2017-10-20 DIAGNOSIS — Z89411 Acquired absence of right great toe: Secondary | ICD-10-CM | POA: Diagnosis not present

## 2017-10-20 DIAGNOSIS — E1169 Type 2 diabetes mellitus with other specified complication: Secondary | ICD-10-CM | POA: Diagnosis not present

## 2017-10-20 DIAGNOSIS — E11621 Type 2 diabetes mellitus with foot ulcer: Secondary | ICD-10-CM | POA: Diagnosis not present

## 2017-10-20 DIAGNOSIS — I10 Essential (primary) hypertension: Secondary | ICD-10-CM | POA: Diagnosis not present

## 2017-10-20 DIAGNOSIS — L97511 Non-pressure chronic ulcer of other part of right foot limited to breakdown of skin: Secondary | ICD-10-CM | POA: Diagnosis not present

## 2017-10-20 DIAGNOSIS — Z4781 Encounter for orthopedic aftercare following surgical amputation: Secondary | ICD-10-CM | POA: Diagnosis not present

## 2017-10-20 DIAGNOSIS — L02611 Cutaneous abscess of right foot: Secondary | ICD-10-CM | POA: Diagnosis not present

## 2017-10-20 DIAGNOSIS — M86171 Other acute osteomyelitis, right ankle and foot: Secondary | ICD-10-CM | POA: Diagnosis not present

## 2017-10-20 DIAGNOSIS — L02415 Cutaneous abscess of right lower limb: Secondary | ICD-10-CM | POA: Diagnosis not present

## 2017-10-20 DIAGNOSIS — L03115 Cellulitis of right lower limb: Secondary | ICD-10-CM | POA: Diagnosis not present

## 2017-10-21 ENCOUNTER — Other Ambulatory Visit: Payer: Self-pay | Admitting: *Deleted

## 2017-10-21 ENCOUNTER — Encounter: Payer: Self-pay | Admitting: *Deleted

## 2017-10-21 DIAGNOSIS — E785 Hyperlipidemia, unspecified: Secondary | ICD-10-CM

## 2017-10-21 DIAGNOSIS — I10 Essential (primary) hypertension: Secondary | ICD-10-CM

## 2017-10-21 DIAGNOSIS — E1169 Type 2 diabetes mellitus with other specified complication: Secondary | ICD-10-CM | POA: Diagnosis not present

## 2017-10-21 DIAGNOSIS — E11621 Type 2 diabetes mellitus with foot ulcer: Secondary | ICD-10-CM | POA: Diagnosis not present

## 2017-10-21 DIAGNOSIS — Z89411 Acquired absence of right great toe: Secondary | ICD-10-CM | POA: Diagnosis not present

## 2017-10-21 DIAGNOSIS — T8189XA Other complications of procedures, not elsewhere classified, initial encounter: Secondary | ICD-10-CM | POA: Diagnosis not present

## 2017-10-21 DIAGNOSIS — L03115 Cellulitis of right lower limb: Secondary | ICD-10-CM | POA: Diagnosis not present

## 2017-10-21 DIAGNOSIS — M86171 Other acute osteomyelitis, right ankle and foot: Secondary | ICD-10-CM | POA: Diagnosis not present

## 2017-10-21 DIAGNOSIS — L02611 Cutaneous abscess of right foot: Secondary | ICD-10-CM | POA: Diagnosis not present

## 2017-10-21 DIAGNOSIS — L02415 Cutaneous abscess of right lower limb: Secondary | ICD-10-CM | POA: Diagnosis not present

## 2017-10-21 DIAGNOSIS — Z4781 Encounter for orthopedic aftercare following surgical amputation: Secondary | ICD-10-CM | POA: Diagnosis not present

## 2017-10-21 DIAGNOSIS — L97511 Non-pressure chronic ulcer of other part of right foot limited to breakdown of skin: Secondary | ICD-10-CM | POA: Diagnosis not present

## 2017-10-21 NOTE — Telephone Encounter (Signed)
Left message on machine with verbal orders. 

## 2017-10-22 DIAGNOSIS — E1169 Type 2 diabetes mellitus with other specified complication: Secondary | ICD-10-CM | POA: Diagnosis not present

## 2017-10-22 DIAGNOSIS — L02611 Cutaneous abscess of right foot: Secondary | ICD-10-CM | POA: Diagnosis not present

## 2017-10-22 DIAGNOSIS — L03115 Cellulitis of right lower limb: Secondary | ICD-10-CM | POA: Diagnosis not present

## 2017-10-22 DIAGNOSIS — M86171 Other acute osteomyelitis, right ankle and foot: Secondary | ICD-10-CM | POA: Diagnosis not present

## 2017-10-22 DIAGNOSIS — E11621 Type 2 diabetes mellitus with foot ulcer: Secondary | ICD-10-CM | POA: Diagnosis not present

## 2017-10-22 DIAGNOSIS — L97511 Non-pressure chronic ulcer of other part of right foot limited to breakdown of skin: Secondary | ICD-10-CM | POA: Diagnosis not present

## 2017-10-22 DIAGNOSIS — L02415 Cutaneous abscess of right lower limb: Secondary | ICD-10-CM | POA: Diagnosis not present

## 2017-10-22 DIAGNOSIS — Z89411 Acquired absence of right great toe: Secondary | ICD-10-CM | POA: Diagnosis not present

## 2017-10-22 DIAGNOSIS — I10 Essential (primary) hypertension: Secondary | ICD-10-CM | POA: Diagnosis not present

## 2017-10-22 DIAGNOSIS — Z4781 Encounter for orthopedic aftercare following surgical amputation: Secondary | ICD-10-CM | POA: Diagnosis not present

## 2017-10-23 ENCOUNTER — Telehealth: Payer: Self-pay | Admitting: *Deleted

## 2017-10-23 DIAGNOSIS — G894 Chronic pain syndrome: Secondary | ICD-10-CM | POA: Diagnosis not present

## 2017-10-23 DIAGNOSIS — E1142 Type 2 diabetes mellitus with diabetic polyneuropathy: Secondary | ICD-10-CM | POA: Diagnosis not present

## 2017-10-23 DIAGNOSIS — Z79891 Long term (current) use of opiate analgesic: Secondary | ICD-10-CM | POA: Diagnosis not present

## 2017-10-23 DIAGNOSIS — G4700 Insomnia, unspecified: Secondary | ICD-10-CM | POA: Diagnosis not present

## 2017-10-23 NOTE — Telephone Encounter (Signed)
Copied from [place] [phone]. Topic: General - Other >> Oct 22, 2017  3:47 PM Judyann Munson wrote: Reason for CRM: [facility]  w/ [place] [phone]  Need Verbal orders:  twice a week for three weeks for balance exercise and strengthening    [phone]

## 2017-10-24 ENCOUNTER — Telehealth: Payer: Self-pay | Admitting: Family Medicine

## 2017-10-24 DIAGNOSIS — Z4781 Encounter for orthopedic aftercare following surgical amputation: Secondary | ICD-10-CM | POA: Diagnosis not present

## 2017-10-24 DIAGNOSIS — Z89411 Acquired absence of right great toe: Secondary | ICD-10-CM | POA: Diagnosis not present

## 2017-10-24 DIAGNOSIS — M86171 Other acute osteomyelitis, right ankle and foot: Secondary | ICD-10-CM | POA: Diagnosis not present

## 2017-10-24 DIAGNOSIS — L03115 Cellulitis of right lower limb: Secondary | ICD-10-CM | POA: Diagnosis not present

## 2017-10-24 DIAGNOSIS — L02415 Cutaneous abscess of right lower limb: Secondary | ICD-10-CM | POA: Diagnosis not present

## 2017-10-24 DIAGNOSIS — L97511 Non-pressure chronic ulcer of other part of right foot limited to breakdown of skin: Secondary | ICD-10-CM | POA: Diagnosis not present

## 2017-10-24 DIAGNOSIS — E11621 Type 2 diabetes mellitus with foot ulcer: Secondary | ICD-10-CM | POA: Diagnosis not present

## 2017-10-24 DIAGNOSIS — I10 Essential (primary) hypertension: Secondary | ICD-10-CM | POA: Diagnosis not present

## 2017-10-24 DIAGNOSIS — E1169 Type 2 diabetes mellitus with other specified complication: Secondary | ICD-10-CM | POA: Diagnosis not present

## 2017-10-24 DIAGNOSIS — L02611 Cutaneous abscess of right foot: Secondary | ICD-10-CM | POA: Diagnosis not present

## 2017-10-24 NOTE — Telephone Encounter (Signed)
Copied from [place] [phone]. Topic: General - Other >> Oct 22, 2017  3:47 PM Judyann Munson wrote: Reason for CRM: [facility]  w/ [place] [phone]  Need Verbal orders:  twice a week for three weeks for balance exercise and strengthening    [phone]

## 2017-10-27 DIAGNOSIS — E1169 Type 2 diabetes mellitus with other specified complication: Secondary | ICD-10-CM | POA: Diagnosis not present

## 2017-10-27 DIAGNOSIS — T8189XA Other complications of procedures, not elsewhere classified, initial encounter: Secondary | ICD-10-CM | POA: Diagnosis not present

## 2017-10-27 DIAGNOSIS — Z89411 Acquired absence of right great toe: Secondary | ICD-10-CM | POA: Diagnosis not present

## 2017-10-27 DIAGNOSIS — I10 Essential (primary) hypertension: Secondary | ICD-10-CM | POA: Diagnosis not present

## 2017-10-27 DIAGNOSIS — Z4781 Encounter for orthopedic aftercare following surgical amputation: Secondary | ICD-10-CM | POA: Diagnosis not present

## 2017-10-27 DIAGNOSIS — L02415 Cutaneous abscess of right lower limb: Secondary | ICD-10-CM | POA: Diagnosis not present

## 2017-10-27 DIAGNOSIS — E11621 Type 2 diabetes mellitus with foot ulcer: Secondary | ICD-10-CM | POA: Diagnosis not present

## 2017-10-27 DIAGNOSIS — L02611 Cutaneous abscess of right foot: Secondary | ICD-10-CM | POA: Diagnosis not present

## 2017-10-27 DIAGNOSIS — M86171 Other acute osteomyelitis, right ankle and foot: Secondary | ICD-10-CM | POA: Diagnosis not present

## 2017-10-27 DIAGNOSIS — L97511 Non-pressure chronic ulcer of other part of right foot limited to breakdown of skin: Secondary | ICD-10-CM | POA: Diagnosis not present

## 2017-10-27 DIAGNOSIS — L03115 Cellulitis of right lower limb: Secondary | ICD-10-CM | POA: Diagnosis not present

## 2017-10-27 NOTE — Telephone Encounter (Signed)
Duplicate telephone note- verbals given.

## 2017-10-27 NOTE — Telephone Encounter (Signed)
Verbal orders given  

## 2017-10-28 ENCOUNTER — Telehealth: Payer: Self-pay | Admitting: *Deleted

## 2017-10-28 ENCOUNTER — Ambulatory Visit (INDEPENDENT_AMBULATORY_CARE_PROVIDER_SITE_OTHER): Payer: Medicare HMO | Admitting: Orthopaedic Surgery

## 2017-10-28 DIAGNOSIS — Z89411 Acquired absence of right great toe: Secondary | ICD-10-CM

## 2017-10-28 NOTE — Progress Notes (Signed)
Post-Op Visit Note   Patient: Brittany [name]           Date of Birth: 1946-02-29           MRN: [id number] Visit Date: 10/28/2017 PCP: Ann Held, DO   Assessment & Plan:  Chief Complaint:  Chief Complaint  Patient presents with  . Right Foot - Routine Post Op    09/29/17 1st ray amputation   Visit Diagnoses:  1. 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  She has been compliant in her Darco shoe heel weightbearing.  She has been getting wound VAC changes 3 times a week.  No fevers, chills or any other systemic symptoms.  No pain.  Examination of her left foot reveals slight erythema.  Good granulation tissue.  No signs of infection.  Today, we will place a wet-to-dry dressing.  She will continue with the wound VAC starting tomorrow.  She will follow-up with [place] in 3 weeks time for recheck.  Call with concerns or questions in the meantime.  Follow-Up Instructions: Return in about 3 weeks (around 11/18/2017).   Orders:  No orders of the defined types were placed in this encounter.  No orders of the defined types were placed in this encounter.   Imaging: No results found.  PMFS History: Patient Active Problem List   Diagnosis Date Noted  . Generalized anxiety disorder 10/16/2017  . Menopause 10/16/2017  . Uncontrolled type 2 diabetes mellitus with ketoacidosis without coma, without long-term current use of insulin ([place]) 10/16/2017  . Hyperlipidemia associated with type 2 diabetes mellitus ([place]) 10/16/2017  . 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 Osteomyelitis of great toe of right foot ([place]) 09/25/2017  . Pain in right foot 09/16/2017  . Cellulitis 08/08/2017  . Hypoglycemia without diagnosis of diabetes mellitus 08/08/2017  .  Peripheral neuropathy 08/08/2017  . Abscess 08/08/2017  . Hyperlipidemia LDL goal <70 09/22/2016  . Tooth pain 09/03/2012  . Edema 09/03/2012  . Supraclavicular fossa fullness 06/18/2012  . Hair loss 09/18/2010  . Fatigue 09/18/2010  . DYSPEPSIA&OTHER [facility] DISORDERS FUNCTION STOMACH 05/08/2010  . DIARRHEA 05/08/2010  . DIZZINESS 04/19/2010  . OTHER ACUTE SINUSITIS 02/28/2010  . NAUSEA 02/28/2010  . KNEE PAIN, LEFT 02/15/2010  . URI 07/09/2007  . GERD 02/12/2007  . LOW BACK PAIN 02/12/2007  . Hypertension 09/03/2006  . HOT FLASHES 09/03/2006  . INSOMNIA 09/03/2006   Past Medical History:  Diagnosis Date  . Anemia    as a younger woman  . Arthritis    "all over my body" (10/01/2017)  . Chronic pain    In pain clinic   . Diabetic peripheral neuropathy ([place])   . GERD (gastroesophageal reflux disease)   . High cholesterol   . History of hiatal hernia   . Hypertension   . Type II diabetes mellitus ([place]) dx'd ~ 2008    Family History  Problem Relation Age of Onset  . Cancer Mother        breast  . Depression Mother        bipolar  . Breast cancer Mother 11  . Heart disease Father 2       MI  . Diabetes Brother   . Kidney disease  Brother   . Hypertension Brother   . Hyperlipidemia Brother   . Stroke Brother   . Coronary artery disease Unknown   . Diabetes Unknown     Past Surgical History:  Procedure Laterality Date  . AMPUTATION Right 09/29/2017   Procedure: AMPUTATION RIGHT 1ST RAY;  Surgeon: Leandrew Koyanagi, MD;  Location: [place];  Service: Orthopedics;  Laterality: Right;  . APPLICATION OF WOUND VAC Right 09/29/2017   "foot"  . BACK SURGERY    . COLONOSCOPY    . I&D EXTREMITY Right 08/10/2017   Procedure: IRRIGATION AND DEBRIDEMENT FOOT;  Surgeon: Leandrew Koyanagi, MD;  Location: [place];  Service: Orthopedics;  Laterality: Right;  . [place]  . POSTERIOR LUMBAR FUSION  1996  . TOE SURGERY Right 1980s   bone removed "inbetween my toes"  . VAGINAL  HYSTERECTOMY     Social History   Occupational History  . Not on file  Tobacco Use  . Smoking status: Never Smoker  . Smokeless tobacco: Never Used  Substance and Sexual Activity  . Alcohol use: Never    Frequency: Never  . Drug use: Never  . Sexual activity: Not Currently    Partners: Male

## 2017-10-28 NOTE — Telephone Encounter (Signed)
Received Home Health Certification and Plan of Care Update; forwarded to provider/SLS 07/23  

## 2017-10-29 DIAGNOSIS — Z89411 Acquired absence of right great toe: Secondary | ICD-10-CM | POA: Diagnosis not present

## 2017-10-29 DIAGNOSIS — I10 Essential (primary) hypertension: Secondary | ICD-10-CM | POA: Diagnosis not present

## 2017-10-29 DIAGNOSIS — L97511 Non-pressure chronic ulcer of other part of right foot limited to breakdown of skin: Secondary | ICD-10-CM | POA: Diagnosis not present

## 2017-10-29 DIAGNOSIS — E1169 Type 2 diabetes mellitus with other specified complication: Secondary | ICD-10-CM | POA: Diagnosis not present

## 2017-10-29 DIAGNOSIS — M86171 Other acute osteomyelitis, right ankle and foot: Secondary | ICD-10-CM | POA: Diagnosis not present

## 2017-10-29 DIAGNOSIS — L03115 Cellulitis of right lower limb: Secondary | ICD-10-CM | POA: Diagnosis not present

## 2017-10-29 DIAGNOSIS — Z4781 Encounter for orthopedic aftercare following surgical amputation: Secondary | ICD-10-CM | POA: Diagnosis not present

## 2017-10-29 DIAGNOSIS — E11621 Type 2 diabetes mellitus with foot ulcer: Secondary | ICD-10-CM | POA: Diagnosis not present

## 2017-10-29 DIAGNOSIS — L02611 Cutaneous abscess of right foot: Secondary | ICD-10-CM | POA: Diagnosis not present

## 2017-10-29 DIAGNOSIS — L02415 Cutaneous abscess of right lower limb: Secondary | ICD-10-CM | POA: Diagnosis not present

## 2017-10-31 ENCOUNTER — Telehealth: Payer: Self-pay | Admitting: Family Medicine

## 2017-10-31 DIAGNOSIS — L03115 Cellulitis of right lower limb: Secondary | ICD-10-CM | POA: Diagnosis not present

## 2017-10-31 DIAGNOSIS — L97511 Non-pressure chronic ulcer of other part of right foot limited to breakdown of skin: Secondary | ICD-10-CM | POA: Diagnosis not present

## 2017-10-31 DIAGNOSIS — I10 Essential (primary) hypertension: Secondary | ICD-10-CM | POA: Diagnosis not present

## 2017-10-31 DIAGNOSIS — E11621 Type 2 diabetes mellitus with foot ulcer: Secondary | ICD-10-CM | POA: Diagnosis not present

## 2017-10-31 DIAGNOSIS — L02611 Cutaneous abscess of right foot: Secondary | ICD-10-CM | POA: Diagnosis not present

## 2017-10-31 DIAGNOSIS — L02415 Cutaneous abscess of right lower limb: Secondary | ICD-10-CM | POA: Diagnosis not present

## 2017-10-31 DIAGNOSIS — E1169 Type 2 diabetes mellitus with other specified complication: Secondary | ICD-10-CM | POA: Diagnosis not present

## 2017-10-31 DIAGNOSIS — Z4781 Encounter for orthopedic aftercare following surgical amputation: Secondary | ICD-10-CM | POA: Diagnosis not present

## 2017-10-31 DIAGNOSIS — Z89411 Acquired absence of right great toe: Secondary | ICD-10-CM | POA: Diagnosis not present

## 2017-10-31 DIAGNOSIS — M86171 Other acute osteomyelitis, right ankle and foot: Secondary | ICD-10-CM | POA: Diagnosis not present

## 2017-10-31 NOTE — Telephone Encounter (Signed)
If no pain/ discomfort --  Nothing to do Happy to see her if she is having any problems

## 2017-10-31 NOTE — Telephone Encounter (Signed)
Please advise 

## 2017-10-31 NOTE — Telephone Encounter (Signed)
Copied from [place] [id number]. Topic: Quick Communication - See Telephone Encounter >> Oct 31, 2017 11:20 AM Conception Chancy, NT wrote: CRM for notification. See Telephone encounter for: 10/31/17.  Claiborne Billings is a physical therapist with [facility] calling to inform the office that the patient fell yesterday, she tripped on her shoe. Patient is okay, she hit her under right arm but it is not causing her any pain.   Cb# [phone]

## 2017-11-03 DIAGNOSIS — Z89411 Acquired absence of right great toe: Secondary | ICD-10-CM | POA: Diagnosis not present

## 2017-11-03 DIAGNOSIS — Z4781 Encounter for orthopedic aftercare following surgical amputation: Secondary | ICD-10-CM | POA: Diagnosis not present

## 2017-11-03 DIAGNOSIS — L02611 Cutaneous abscess of right foot: Secondary | ICD-10-CM | POA: Diagnosis not present

## 2017-11-03 DIAGNOSIS — L97511 Non-pressure chronic ulcer of other part of right foot limited to breakdown of skin: Secondary | ICD-10-CM | POA: Diagnosis not present

## 2017-11-03 DIAGNOSIS — L03115 Cellulitis of right lower limb: Secondary | ICD-10-CM | POA: Diagnosis not present

## 2017-11-03 DIAGNOSIS — E1169 Type 2 diabetes mellitus with other specified complication: Secondary | ICD-10-CM | POA: Diagnosis not present

## 2017-11-03 DIAGNOSIS — I10 Essential (primary) hypertension: Secondary | ICD-10-CM | POA: Diagnosis not present

## 2017-11-03 DIAGNOSIS — M86171 Other acute osteomyelitis, right ankle and foot: Secondary | ICD-10-CM | POA: Diagnosis not present

## 2017-11-03 DIAGNOSIS — E11621 Type 2 diabetes mellitus with foot ulcer: Secondary | ICD-10-CM | POA: Diagnosis not present

## 2017-11-03 DIAGNOSIS — L02415 Cutaneous abscess of right lower limb: Secondary | ICD-10-CM | POA: Diagnosis not present

## 2017-11-05 DIAGNOSIS — L02611 Cutaneous abscess of right foot: Secondary | ICD-10-CM | POA: Diagnosis not present

## 2017-11-05 DIAGNOSIS — M86171 Other acute osteomyelitis, right ankle and foot: Secondary | ICD-10-CM | POA: Diagnosis not present

## 2017-11-05 DIAGNOSIS — Z89411 Acquired absence of right great toe: Secondary | ICD-10-CM | POA: Diagnosis not present

## 2017-11-05 DIAGNOSIS — L97511 Non-pressure chronic ulcer of other part of right foot limited to breakdown of skin: Secondary | ICD-10-CM | POA: Diagnosis not present

## 2017-11-05 DIAGNOSIS — Z4781 Encounter for orthopedic aftercare following surgical amputation: Secondary | ICD-10-CM | POA: Diagnosis not present

## 2017-11-05 DIAGNOSIS — L03115 Cellulitis of right lower limb: Secondary | ICD-10-CM | POA: Diagnosis not present

## 2017-11-05 DIAGNOSIS — I10 Essential (primary) hypertension: Secondary | ICD-10-CM | POA: Diagnosis not present

## 2017-11-05 DIAGNOSIS — E11621 Type 2 diabetes mellitus with foot ulcer: Secondary | ICD-10-CM | POA: Diagnosis not present

## 2017-11-05 DIAGNOSIS — L02415 Cutaneous abscess of right lower limb: Secondary | ICD-10-CM | POA: Diagnosis not present

## 2017-11-05 DIAGNOSIS — E1169 Type 2 diabetes mellitus with other specified complication: Secondary | ICD-10-CM | POA: Diagnosis not present

## 2017-11-07 DIAGNOSIS — L02611 Cutaneous abscess of right foot: Secondary | ICD-10-CM | POA: Diagnosis not present

## 2017-11-07 DIAGNOSIS — E1169 Type 2 diabetes mellitus with other specified complication: Secondary | ICD-10-CM | POA: Diagnosis not present

## 2017-11-07 DIAGNOSIS — L97511 Non-pressure chronic ulcer of other part of right foot limited to breakdown of skin: Secondary | ICD-10-CM | POA: Diagnosis not present

## 2017-11-07 DIAGNOSIS — E11621 Type 2 diabetes mellitus with foot ulcer: Secondary | ICD-10-CM | POA: Diagnosis not present

## 2017-11-07 DIAGNOSIS — Z4781 Encounter for orthopedic aftercare following surgical amputation: Secondary | ICD-10-CM | POA: Diagnosis not present

## 2017-11-07 DIAGNOSIS — L02415 Cutaneous abscess of right lower limb: Secondary | ICD-10-CM | POA: Diagnosis not present

## 2017-11-07 DIAGNOSIS — L03115 Cellulitis of right lower limb: Secondary | ICD-10-CM | POA: Diagnosis not present

## 2017-11-07 DIAGNOSIS — M86171 Other acute osteomyelitis, right ankle and foot: Secondary | ICD-10-CM | POA: Diagnosis not present

## 2017-11-07 DIAGNOSIS — Z89411 Acquired absence of right great toe: Secondary | ICD-10-CM | POA: Diagnosis not present

## 2017-11-07 DIAGNOSIS — I10 Essential (primary) hypertension: Secondary | ICD-10-CM | POA: Diagnosis not present

## 2017-11-10 DIAGNOSIS — Z89411 Acquired absence of right great toe: Secondary | ICD-10-CM | POA: Diagnosis not present

## 2017-11-10 DIAGNOSIS — M86171 Other acute osteomyelitis, right ankle and foot: Secondary | ICD-10-CM | POA: Diagnosis not present

## 2017-11-10 DIAGNOSIS — L02415 Cutaneous abscess of right lower limb: Secondary | ICD-10-CM | POA: Diagnosis not present

## 2017-11-10 DIAGNOSIS — L97511 Non-pressure chronic ulcer of other part of right foot limited to breakdown of skin: Secondary | ICD-10-CM | POA: Diagnosis not present

## 2017-11-10 DIAGNOSIS — E1169 Type 2 diabetes mellitus with other specified complication: Secondary | ICD-10-CM | POA: Diagnosis not present

## 2017-11-10 DIAGNOSIS — E11621 Type 2 diabetes mellitus with foot ulcer: Secondary | ICD-10-CM | POA: Diagnosis not present

## 2017-11-10 DIAGNOSIS — I10 Essential (primary) hypertension: Secondary | ICD-10-CM | POA: Diagnosis not present

## 2017-11-10 DIAGNOSIS — L02611 Cutaneous abscess of right foot: Secondary | ICD-10-CM | POA: Diagnosis not present

## 2017-11-10 DIAGNOSIS — Z4781 Encounter for orthopedic aftercare following surgical amputation: Secondary | ICD-10-CM | POA: Diagnosis not present

## 2017-11-10 DIAGNOSIS — L03115 Cellulitis of right lower limb: Secondary | ICD-10-CM | POA: Diagnosis not present

## 2017-11-12 DIAGNOSIS — E11621 Type 2 diabetes mellitus with foot ulcer: Secondary | ICD-10-CM | POA: Diagnosis not present

## 2017-11-12 DIAGNOSIS — Z89411 Acquired absence of right great toe: Secondary | ICD-10-CM | POA: Diagnosis not present

## 2017-11-12 DIAGNOSIS — G629 Polyneuropathy, unspecified: Secondary | ICD-10-CM | POA: Diagnosis not present

## 2017-11-12 DIAGNOSIS — M86171 Other acute osteomyelitis, right ankle and foot: Secondary | ICD-10-CM | POA: Diagnosis not present

## 2017-11-12 DIAGNOSIS — L02415 Cutaneous abscess of right lower limb: Secondary | ICD-10-CM | POA: Diagnosis not present

## 2017-11-12 DIAGNOSIS — E1169 Type 2 diabetes mellitus with other specified complication: Secondary | ICD-10-CM | POA: Diagnosis not present

## 2017-11-12 DIAGNOSIS — L97511 Non-pressure chronic ulcer of other part of right foot limited to breakdown of skin: Secondary | ICD-10-CM | POA: Diagnosis not present

## 2017-11-12 DIAGNOSIS — L0291 Cutaneous abscess, unspecified: Secondary | ICD-10-CM | POA: Diagnosis not present

## 2017-11-12 DIAGNOSIS — I10 Essential (primary) hypertension: Secondary | ICD-10-CM | POA: Diagnosis not present

## 2017-11-12 DIAGNOSIS — L03115 Cellulitis of right lower limb: Secondary | ICD-10-CM | POA: Diagnosis not present

## 2017-11-12 DIAGNOSIS — L0390 Cellulitis, unspecified: Secondary | ICD-10-CM | POA: Diagnosis not present

## 2017-11-12 DIAGNOSIS — L02611 Cutaneous abscess of right foot: Secondary | ICD-10-CM | POA: Diagnosis not present

## 2017-11-12 DIAGNOSIS — Z4781 Encounter for orthopedic aftercare following surgical amputation: Secondary | ICD-10-CM | POA: Diagnosis not present

## 2017-11-18 ENCOUNTER — Ambulatory Visit (INDEPENDENT_AMBULATORY_CARE_PROVIDER_SITE_OTHER): Payer: Medicare HMO | Admitting: Orthopaedic Surgery

## 2017-11-18 ENCOUNTER — Encounter (INDEPENDENT_AMBULATORY_CARE_PROVIDER_SITE_OTHER): Payer: Self-pay | Admitting: Orthopaedic Surgery

## 2017-11-18 DIAGNOSIS — S91301D Unspecified open wound, right foot, subsequent encounter: Secondary | ICD-10-CM

## 2017-11-18 DIAGNOSIS — M869 Osteomyelitis, unspecified: Secondary | ICD-10-CM

## 2017-11-18 NOTE — Progress Notes (Signed)
Post-Op Visit Note   Patient: Brittany [name]           Date of Birth: 1945-09-01           MRN: [id number] Visit Date: 11/18/2017 PCP: Ann Held, DO   Assessment & Plan:  Chief Complaint:  Chief Complaint  Patient presents with  . Right Foot - Pain, Follow-up   Visit Diagnoses:  1. Osteomyelitis of great toe of right foot ([place])   2. Open wound of right foot, subsequent encounter     Plan: Patient is coming in for wound check today.  Overall she is doing well.  Her open wound has greatly.  She has good granulation tissue at this point.  No evidence of infection.  She has some local surrounding erythema that is typical for her surgery.  She has no drainage or evidence of infection.  At this point we will convert her to wet-to-dry dressings twice a day.  Continue with Darco shoe.  Recheck in 4 weeks.  Follow-Up Instructions: Return in about 4 weeks (around 12/16/2017).   Orders:  No orders of the defined types were placed in this encounter.  No orders of the defined types were placed in this encounter.   Imaging: No results found.  PMFS History: Patient Active Problem List   Diagnosis Date Noted  . Generalized anxiety disorder 10/16/2017  . Menopause 10/16/2017  . Uncontrolled type 2 diabetes mellitus with ketoacidosis without coma, without long-term current use of insulin ([place]) 10/16/2017  . Hyperlipidemia associated with type 2 diabetes mellitus ([place]) 10/16/2017  . 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 Osteomyelitis of great toe of right foot ([place]) 09/25/2017  . Pain in right foot 09/16/2017  . Cellulitis 08/08/2017  . Hypoglycemia without diagnosis of diabetes mellitus 08/08/2017  . Peripheral neuropathy 08/08/2017  . Abscess 08/08/2017  . Hyperlipidemia LDL goal <70 09/22/2016  . Tooth pain 09/03/2012  . Edema 09/03/2012  . Supraclavicular fossa fullness 06/18/2012  . Hair loss 09/18/2010  . Fatigue  09/18/2010  . DYSPEPSIA&OTHER [facility] DISORDERS FUNCTION STOMACH 05/08/2010  . DIARRHEA 05/08/2010  . DIZZINESS 04/19/2010  . OTHER ACUTE SINUSITIS 02/28/2010  . NAUSEA 02/28/2010  . KNEE PAIN, LEFT 02/15/2010  . URI 07/09/2007  . GERD 02/12/2007  . LOW BACK PAIN 02/12/2007  . Hypertension 09/03/2006  . HOT FLASHES 09/03/2006  . INSOMNIA 09/03/2006   Past Medical History:  Diagnosis Date  . Anemia    as a younger woman  . Arthritis    "all over my body" (10/01/2017)  . Chronic pain    In pain clinic   . Diabetic peripheral neuropathy ([place])   . GERD (gastroesophageal reflux disease)   . High cholesterol   . History of hiatal hernia   . Hypertension   . Type II diabetes mellitus ([place]) dx'd ~ 2008    Family History  Problem Relation Age of Onset  . Cancer Mother        breast  . Depression Mother        bipolar  . Breast cancer Mother 66  . Heart disease Father 65       MI  . Diabetes Brother   . Kidney disease Brother   . Hypertension Brother   . Hyperlipidemia Brother   . Stroke Brother   . Coronary artery disease Unknown   . Diabetes Unknown     Past Surgical History:  Procedure Laterality Date  .  AMPUTATION Right 09/29/2017   Procedure: AMPUTATION RIGHT 1ST RAY;  Surgeon: Leandrew Koyanagi, MD;  Location: [place];  Service: Orthopedics;  Laterality: Right;  . APPLICATION OF WOUND VAC Right 09/29/2017   "foot"  . BACK SURGERY    . COLONOSCOPY    . I&D EXTREMITY Right 08/10/2017   Procedure: IRRIGATION AND DEBRIDEMENT FOOT;  Surgeon: Leandrew Koyanagi, MD;  Location: [place];  Service: Orthopedics;  Laterality: Right;  . [place]  . POSTERIOR LUMBAR FUSION  1996  . TOE SURGERY Right 1980s   bone removed "inbetween my toes"  . VAGINAL HYSTERECTOMY     Social History   Occupational History  . Not on file  Tobacco Use  . Smoking status: Never Smoker  . Smokeless tobacco: Never Used  Substance and Sexual Activity  . Alcohol use: Never    Frequency:  Never  . Drug use: Never  . Sexual activity: Not Currently    Partners: Male

## 2017-11-19 ENCOUNTER — Telehealth: Payer: Self-pay | Admitting: *Deleted

## 2017-11-19 ENCOUNTER — Telehealth (INDEPENDENT_AMBULATORY_CARE_PROVIDER_SITE_OTHER): Payer: Self-pay | Admitting: Orthopaedic Surgery

## 2017-11-19 NOTE — Telephone Encounter (Signed)
Please advise 

## 2017-11-19 NOTE — Telephone Encounter (Signed)
Donita Odem, Nurse, [facility], left a message yesterday wanting to know if there are any changes in her orders since her appointment with Dr. Erlinda Hong.  [phone].  Thank you.

## 2017-11-19 NOTE — Telephone Encounter (Signed)
Received Physician Orders from [facility]; forwarded to provider/SLS 08/14  

## 2017-11-19 NOTE — Telephone Encounter (Signed)
Wet to dry BID

## 2017-11-20 NOTE — Telephone Encounter (Signed)
Called to advise Brittany [name]

## 2017-11-21 DIAGNOSIS — L97511 Non-pressure chronic ulcer of other part of right foot limited to breakdown of skin: Secondary | ICD-10-CM | POA: Diagnosis not present

## 2017-11-21 DIAGNOSIS — L03115 Cellulitis of right lower limb: Secondary | ICD-10-CM | POA: Diagnosis not present

## 2017-11-21 DIAGNOSIS — E11621 Type 2 diabetes mellitus with foot ulcer: Secondary | ICD-10-CM | POA: Diagnosis not present

## 2017-11-21 DIAGNOSIS — I10 Essential (primary) hypertension: Secondary | ICD-10-CM | POA: Diagnosis not present

## 2017-11-21 DIAGNOSIS — L02415 Cutaneous abscess of right lower limb: Secondary | ICD-10-CM | POA: Diagnosis not present

## 2017-11-21 DIAGNOSIS — E1169 Type 2 diabetes mellitus with other specified complication: Secondary | ICD-10-CM | POA: Diagnosis not present

## 2017-11-21 DIAGNOSIS — Z4781 Encounter for orthopedic aftercare following surgical amputation: Secondary | ICD-10-CM | POA: Diagnosis not present

## 2017-11-21 DIAGNOSIS — M86171 Other acute osteomyelitis, right ankle and foot: Secondary | ICD-10-CM | POA: Diagnosis not present

## 2017-11-21 DIAGNOSIS — Z89411 Acquired absence of right great toe: Secondary | ICD-10-CM | POA: Diagnosis not present

## 2017-11-21 DIAGNOSIS — L02611 Cutaneous abscess of right foot: Secondary | ICD-10-CM | POA: Diagnosis not present

## 2017-12-11 DIAGNOSIS — E1142 Type 2 diabetes mellitus with diabetic polyneuropathy: Secondary | ICD-10-CM | POA: Diagnosis not present

## 2017-12-11 DIAGNOSIS — G4700 Insomnia, unspecified: Secondary | ICD-10-CM | POA: Diagnosis not present

## 2017-12-11 DIAGNOSIS — G894 Chronic pain syndrome: Secondary | ICD-10-CM | POA: Diagnosis not present

## 2017-12-11 DIAGNOSIS — Z79891 Long term (current) use of opiate analgesic: Secondary | ICD-10-CM | POA: Diagnosis not present

## 2017-12-12 ENCOUNTER — Telehealth: Payer: Self-pay | Admitting: *Deleted

## 2017-12-12 NOTE — Telephone Encounter (Signed)
Copied from [place] [id number]. Topic: Inquiry >> Dec 10, 2017  2:06 PM Conception Chancy, NT wrote: Reason for CRM: patient is calling and states that she spoke with Dr. Etter Sjogren about going and getting a bladder tact. She does not remember the provider name that Dr. Etter Sjogren mentioned. Please contact.

## 2017-12-12 NOTE — Telephone Encounter (Signed)
Patient notified

## 2017-12-12 NOTE — Telephone Encounter (Signed)
Probably [facility]

## 2017-12-16 ENCOUNTER — Ambulatory Visit (INDEPENDENT_AMBULATORY_CARE_PROVIDER_SITE_OTHER): Payer: Medicare HMO | Admitting: Orthopaedic Surgery

## 2017-12-16 ENCOUNTER — Encounter (INDEPENDENT_AMBULATORY_CARE_PROVIDER_SITE_OTHER): Payer: Self-pay | Admitting: Orthopaedic Surgery

## 2017-12-16 DIAGNOSIS — S91301D Unspecified open wound, right foot, subsequent encounter: Secondary | ICD-10-CM

## 2017-12-16 NOTE — Progress Notes (Signed)
Post-Op Visit Note   Patient: Brittany [name]           Date of Birth: [birth year]           MRN: [id number] Visit Date: 12/16/2017 PCP: Ann Held, DO   Assessment & Plan:  Chief Complaint:  Chief Complaint  Patient presents with  . Right Foot - Routine Post Op, Follow-up   Visit Diagnoses:  1. Open wound of right foot, subsequent encounter     Plan: Meleny is 4 months status post first ray amputation.  Overall she is doing well and reports no pain.  Her wound has healed significantly.  She now just has a dry scab.  There is minimal swelling.  No drainage.  There is no open wound.  At this point she is appropriate for compression wrap and eventual prosthetic fitting.  Referral to [employer] was provided today.  Dry dressing to the scab.  Recheck in 6 weeks.  Follow-Up Instructions: Return in about 6 weeks (around 01/27/2018).   Orders:  No orders of the defined types were placed in this encounter.  No orders of the defined types were placed in this encounter.   Imaging: No results found.  PMFS History: Patient Active Problem List   Diagnosis Date Noted  . Generalized anxiety disorder 10/16/2017  . Menopause 10/16/2017  . Uncontrolled type 2 diabetes mellitus with ketoacidosis without coma, without long-term current use of insulin ([place]) 10/16/2017  . Hyperlipidemia associated with type 2 diabetes mellitus ([place]) 10/16/2017  . 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 Osteomyelitis of great toe of right foot ([place]) 09/25/2017  . Pain in right foot 09/16/2017  . Cellulitis 08/08/2017  . Hypoglycemia without diagnosis of diabetes mellitus 08/08/2017  . Peripheral neuropathy 08/08/2017  . Abscess 08/08/2017  . Hyperlipidemia LDL goal <70 09/22/2016  . Tooth pain 09/03/2012  . Edema 09/03/2012  . Supraclavicular fossa fullness 06/18/2012  . Hair loss 09/18/2010  . Fatigue 09/18/2010  . DYSPEPSIA&OTHER [facility] DISORDERS  FUNCTION STOMACH 05/08/2010  . DIARRHEA 05/08/2010  . DIZZINESS 04/19/2010  . OTHER ACUTE SINUSITIS 02/28/2010  . NAUSEA 02/28/2010  . KNEE PAIN, LEFT 02/15/2010  . URI 07/09/2007  . GERD 02/12/2007  . LOW BACK PAIN 02/12/2007  . Hypertension 09/03/2006  . HOT FLASHES 09/03/2006  . INSOMNIA 09/03/2006   Past Medical History:  Diagnosis Date  . Anemia    as a younger woman  . Arthritis    "all over my body" (10/01/2017)  . Chronic pain    In pain clinic   . Diabetic peripheral neuropathy ([place])   . GERD (gastroesophageal reflux disease)   . High cholesterol   . History of hiatal hernia   . Hypertension   . Type II diabetes mellitus ([place]) dx'd ~ 2008    Family History  Problem Relation Age of Onset  . Cancer Mother        breast  . Depression Mother        bipolar  . Breast cancer Mother 42  . Heart disease Father 66       MI  . Diabetes Brother   . Kidney disease Brother   . Hypertension Brother   . Hyperlipidemia Brother   . Stroke Brother   . Coronary artery disease Unknown   . Diabetes Unknown     Past Surgical History:  Procedure Laterality Date  . AMPUTATION Right 09/29/2017   Procedure: AMPUTATION RIGHT 1ST  RAY;  Surgeon: Leandrew Koyanagi, MD;  Location: [place];  Service: Orthopedics;  Laterality: Right;  . APPLICATION OF WOUND VAC Right 09/29/2017   "foot"  . BACK SURGERY    . COLONOSCOPY    . I&D EXTREMITY Right 08/10/2017   Procedure: IRRIGATION AND DEBRIDEMENT FOOT;  Surgeon: Leandrew Koyanagi, MD;  Location: [place];  Service: Orthopedics;  Laterality: Right;  . [place]  . POSTERIOR LUMBAR FUSION  1996  . TOE SURGERY Right 1980s   bone removed "inbetween my toes"  . VAGINAL HYSTERECTOMY     Social History   Occupational History  . Not on file  Tobacco Use  . Smoking status: Never Smoker  . Smokeless tobacco: Never Used  Substance and Sexual Activity  . Alcohol use: Never    Frequency: Never  . Drug use: Never  . Sexual activity: Not  Currently    Partners: Male

## 2017-12-17 ENCOUNTER — Telehealth (INDEPENDENT_AMBULATORY_CARE_PROVIDER_SITE_OTHER): Payer: Self-pay

## 2017-12-17 NOTE — Telephone Encounter (Signed)
Please advise 

## 2017-12-17 NOTE — Telephone Encounter (Signed)
Let's that's fine to use.  Just have her watch it closely

## 2017-12-17 NOTE — Telephone Encounter (Signed)
Patient called stating that she has a blister on her skin below her wound.  Stated that she noticed the blister after removing the band-aid.  She used neosporin on the blister and would like to know if that would be okay to use that ointment?  Cb# is [phone].  Please advise.  Thank you.

## 2017-12-18 NOTE — Telephone Encounter (Signed)
Called patient to let her know

## 2017-12-19 ENCOUNTER — Other Ambulatory Visit: Payer: Self-pay | Admitting: Family Medicine

## 2017-12-19 ENCOUNTER — Telehealth: Payer: Self-pay | Admitting: Family Medicine

## 2017-12-19 DIAGNOSIS — R600 Localized edema: Secondary | ICD-10-CM

## 2017-12-19 NOTE — Telephone Encounter (Signed)
Copied from [place] [id number]. Topic: Inquiry >> Dec 18, 2017  4:16 PM Bea Graff, NT wrote: Reason for CRM: Leah a pharmacy tech with [facility] calling to see if a nurse can call her back to verify which medications this pt is on. CB#: [phone]. States pts name and DOB.

## 2017-12-25 NOTE — Telephone Encounter (Signed)
Pharmacy notified of medications

## 2018-01-13 ENCOUNTER — Encounter (INDEPENDENT_AMBULATORY_CARE_PROVIDER_SITE_OTHER): Payer: Self-pay | Admitting: Orthopaedic Surgery

## 2018-01-13 ENCOUNTER — Ambulatory Visit (INDEPENDENT_AMBULATORY_CARE_PROVIDER_SITE_OTHER): Payer: Medicare HMO | Admitting: Orthopaedic Surgery

## 2018-01-13 DIAGNOSIS — L84 Corns and callosities: Secondary | ICD-10-CM | POA: Diagnosis not present

## 2018-01-13 NOTE — Progress Notes (Signed)
Post-Op Visit Note   Patient: Brittany [name]           Date of Birth: 1945-07-09           MRN: [id number] Visit Date: 01/13/2018 PCP: Ann Held, DO   Assessment & Plan:  Chief Complaint:  Chief Complaint  Patient presents with  . Right Foot - Follow-up   Visit Diagnoses:  1. Callus     Plan: Stanton Kidney is a pleasant 72 year old female who presents to our clinic today with concerns about her right foot.  She is 5 months out irrigation debridement first ray amputation right foot 08/10/2017.  She has been doing well weightbearing as tolerated but has recently noticed a callus that is started to form along the second metatarsal head.  There is no associated pain, however she does have neuropathy.  No fevers or chills.  No drainage from the area.  Examination of the right foot reveals a well-healed surgical incision.  There does appear to be a 2 cm diameter callus formation along the second metatarsal head.  There is a small area of redness which looks like blood.  No active drainage.  No tenderness.  No signs of infection.  At this point, we provided the patient with a doughnut to wear in her shoe to off load the pressure to that area.  She is scheduled to follow-up with [place] in a few weeks for her regularly scheduled appointment from her surgery.  We will reassess her callus at that time.  She will call with any worsening symptoms in the meantime.  Follow-Up Instructions: Return for at regularly scheduled appointment.   Orders:  No orders of the defined types were placed in this encounter.  No orders of the defined types were placed in this encounter.   Imaging: No results found.  PMFS History: Patient Active Problem List   Diagnosis Date Noted  . Callus 01/13/2018  . Generalized anxiety disorder 10/16/2017  . Menopause 10/16/2017  . Uncontrolled type 2 diabetes mellitus with ketoacidosis without coma, without long-term current use of insulin ([place]) 10/16/2017  .  Hyperlipidemia associated with type 2 diabetes mellitus ([place]) 10/16/2017  . 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 Osteomyelitis of great toe of right foot ([place]) 09/25/2017  . Pain in right foot 09/16/2017  . Cellulitis 08/08/2017  . Hypoglycemia without diagnosis of diabetes mellitus 08/08/2017  . Peripheral neuropathy 08/08/2017  . Abscess 08/08/2017  . Hyperlipidemia LDL goal <70 09/22/2016  . Tooth pain 09/03/2012  . Edema 09/03/2012  . Supraclavicular fossa fullness 06/18/2012  . Hair loss 09/18/2010  . Fatigue 09/18/2010  . DYSPEPSIA&OTHER [facility] DISORDERS FUNCTION STOMACH 05/08/2010  . DIARRHEA 05/08/2010  . DIZZINESS 04/19/2010  . OTHER ACUTE SINUSITIS 02/28/2010  . NAUSEA 02/28/2010  . KNEE PAIN, LEFT 02/15/2010  . URI 07/09/2007  . GERD 02/12/2007  . LOW BACK PAIN 02/12/2007  . Hypertension 09/03/2006  . HOT FLASHES 09/03/2006  . INSOMNIA 09/03/2006   Past Medical History:  Diagnosis Date  . Anemia    as a younger woman  . Arthritis    "all over my body" (10/01/2017)  . Chronic pain    In pain clinic   . Diabetic peripheral neuropathy ([place])   . GERD (gastroesophageal reflux disease)   . High cholesterol   . History of hiatal hernia   . Hypertension   . Type II diabetes mellitus ([place]) dx'd ~ 2008  Family History  Problem Relation Age of Onset  . Cancer Mother        breast  . Depression Mother        bipolar  . Breast cancer Mother 33  . Heart disease Father 32       MI  . Diabetes Brother   . Kidney disease Brother   . Hypertension Brother   . Hyperlipidemia Brother   . Stroke Brother   . Coronary artery disease Unknown   . Diabetes Unknown     Past Surgical History:  Procedure Laterality Date  . AMPUTATION Right 09/29/2017   Procedure: AMPUTATION RIGHT 1ST RAY;  Surgeon: Leandrew Koyanagi, MD;  Location: [place];  Service: Orthopedics;  Laterality: Right;  . APPLICATION OF WOUND VAC Right 09/29/2017   "foot"   . BACK SURGERY    . COLONOSCOPY    . I&D EXTREMITY Right 08/10/2017   Procedure: IRRIGATION AND DEBRIDEMENT FOOT;  Surgeon: Leandrew Koyanagi, MD;  Location: [place];  Service: Orthopedics;  Laterality: Right;  . [place]  . POSTERIOR LUMBAR FUSION  1996  . TOE SURGERY Right 1980s   bone removed "inbetween my toes"  . VAGINAL HYSTERECTOMY     Social History   Occupational History  . Not on file  Tobacco Use  . Smoking status: Never Smoker  . Smokeless tobacco: Never Used  Substance and Sexual Activity  . Alcohol use: Never    Frequency: Never  . Drug use: Never  . Sexual activity: Not Currently    Partners: Male

## 2018-01-27 ENCOUNTER — Ambulatory Visit (INDEPENDENT_AMBULATORY_CARE_PROVIDER_SITE_OTHER): Payer: Medicare HMO

## 2018-01-27 ENCOUNTER — Ambulatory Visit (INDEPENDENT_AMBULATORY_CARE_PROVIDER_SITE_OTHER): Payer: Medicare HMO | Admitting: Orthopaedic Surgery

## 2018-01-27 ENCOUNTER — Encounter (INDEPENDENT_AMBULATORY_CARE_PROVIDER_SITE_OTHER): Payer: Self-pay | Admitting: Orthopaedic Surgery

## 2018-01-27 DIAGNOSIS — M25571 Pain in right ankle and joints of right foot: Secondary | ICD-10-CM

## 2018-01-27 NOTE — Progress Notes (Signed)
Office Visit Note   Patient: Brittany [name]           Date of Birth: [birth year]           MRN: [id number] Visit Date: 01/27/2018              Requested by: [address] [place], [place] [address] [place], [place] [address] PCP: Carollee Herter, Alferd Apa, DO   Assessment & Plan: Visit Diagnoses:  1. Pain in right ankle and joints of right foot     Plan: At this point, I am not concerned for osteomyelitis or soft tissue infection.  The main issue is needing to avoid the top of her toes pressing up against the shoe.  We will write her a prescription for an extra-depth shoe today.  She will follow-up with [place] in 4 weeks time for recheck.  She will call with any worrisome symptoms in the meantime.  Follow-Up Instructions: Return in about 4 weeks (around 02/24/2018).   Orders:  Orders Placed This Encounter  Procedures  . XR Foot Complete Right   No orders of the defined types were placed in this encounter.     Procedures: No procedures performed   Clinical Data: No additional findings.   Subjective: Chief Complaint  Patient presents with  . Right Foot - Follow-up    HPI patient is a pleasant 72 year old female who presents to our clinic today with concerns about her right foot.  She has a diabetic with peripheral neuropathy.  She is about 5 months out from a right foot first ray amputation.  Doing well there.  About 5 days ago, she bought a new pair of tennis shoes.  About 3 days ago, she noticed swelling and redness to the second third and fourth toes.  No drainage.  We have been following her for a small callus to the second metatarsal head dorsum of the foot.  No fevers or chills.  Review of Systems as detailed in HPI.  All others reviewed and are negative.   Objective: Vital Signs: There were no vitals taken for this visit.  Physical Exam well-developed and well-nourished female in no acute distress.  Alert and oriented x3.  Ortho Exam examination of her  right foot reveals a well-healed surgical incision without evidence of infection.  She does have a small callus to the dorsum of the second metatarsal head.  This is nontender.  This is soft and not draining.  She does have moderate swelling to the second toe and erythema to the top of the second, third and fourth toes.  No drainage to any aspect of these.  Specialty Comments:  No specialty comments available.  Imaging: Xr Foot Complete Right  Result Date: 01/27/2018 No evidence of osteomyelitis or other acute findings    PMFS History: Patient Active Problem List   Diagnosis Date Noted  . Callus 01/13/2018  . Generalized anxiety disorder 10/16/2017  . Menopause 10/16/2017  . Uncontrolled type 2 diabetes mellitus with ketoacidosis without coma, without long-term current use of insulin ([place]) 10/16/2017  . Hyperlipidemia associated with type 2 diabetes mellitus ([place]) 10/16/2017  . 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 Osteomyelitis of great toe of right foot ([place]) 09/25/2017  . Pain in right foot 09/16/2017  . Cellulitis 08/08/2017  . Hypoglycemia without diagnosis of diabetes mellitus 08/08/2017  . Peripheral neuropathy 08/08/2017  . Abscess 08/08/2017  . Hyperlipidemia LDL goal <  70 09/22/2016  . Tooth pain 09/03/2012  . Edema 09/03/2012  . Supraclavicular fossa fullness 06/18/2012  . Hair loss 09/18/2010  . Fatigue 09/18/2010  . DYSPEPSIA&OTHER [facility] DISORDERS FUNCTION STOMACH 05/08/2010  . DIARRHEA 05/08/2010  . DIZZINESS 04/19/2010  . OTHER ACUTE SINUSITIS 02/28/2010  . NAUSEA 02/28/2010  . Pain in right ankle and joints of right foot 02/15/2010  . URI 07/09/2007  . GERD 02/12/2007  . LOW BACK PAIN 02/12/2007  . Hypertension 09/03/2006  . HOT FLASHES 09/03/2006  . INSOMNIA 09/03/2006   Past Medical History:  Diagnosis Date  . Anemia    as a younger woman  . Arthritis    "all over my body" (10/01/2017)  . Chronic pain     In pain clinic   . Diabetic peripheral neuropathy ([place])   . GERD (gastroesophageal reflux disease)   . High cholesterol   . History of hiatal hernia   . Hypertension   . Type II diabetes mellitus ([place]) dx'd ~ 2008    Family History  Problem Relation Age of Onset  . Cancer Mother        breast  . Depression Mother        bipolar  . Breast cancer Mother 36  . Heart disease Father 45       MI  . Diabetes Brother   . Kidney disease Brother   . Hypertension Brother   . Hyperlipidemia Brother   . Stroke Brother   . Coronary artery disease Unknown   . Diabetes Unknown     Past Surgical History:  Procedure Laterality Date  . AMPUTATION Right 09/29/2017   Procedure: AMPUTATION RIGHT 1ST RAY;  Surgeon: Leandrew Koyanagi, MD;  Location: [facility];  Service: Orthopedics;  Laterality: Right;  . APPLICATION OF WOUND VAC Right 09/29/2017   "foot"  . BACK SURGERY    . COLONOSCOPY    . I&D EXTREMITY Right 08/10/2017   Procedure: IRRIGATION AND DEBRIDEMENT FOOT;  Surgeon: Leandrew Koyanagi, MD;  Location: [facility];  Service: Orthopedics;  Laterality: Right;  . [place]  . POSTERIOR LUMBAR FUSION  1996  . TOE SURGERY Right 1980s   bone removed "inbetween my toes"  . VAGINAL HYSTERECTOMY     Social History   Occupational History  . Not on file  Tobacco Use  . Smoking status: Never Smoker  . Smokeless tobacco: Never Used  Substance and Sexual Activity  . Alcohol use: Never    Frequency: Never  . Drug use: Never  . Sexual activity: Not Currently    Partners: Male

## 2018-02-04 DIAGNOSIS — G4700 Insomnia, unspecified: Secondary | ICD-10-CM | POA: Diagnosis not present

## 2018-02-04 DIAGNOSIS — G894 Chronic pain syndrome: Secondary | ICD-10-CM | POA: Diagnosis not present

## 2018-02-04 DIAGNOSIS — Z79891 Long term (current) use of opiate analgesic: Secondary | ICD-10-CM | POA: Diagnosis not present

## 2018-02-04 DIAGNOSIS — E1142 Type 2 diabetes mellitus with diabetic polyneuropathy: Secondary | ICD-10-CM | POA: Diagnosis not present

## 2018-02-24 ENCOUNTER — Ambulatory Visit (INDEPENDENT_AMBULATORY_CARE_PROVIDER_SITE_OTHER): Payer: Medicare HMO | Admitting: Orthopaedic Surgery

## 2018-02-26 DIAGNOSIS — G4700 Insomnia, unspecified: Secondary | ICD-10-CM | POA: Diagnosis not present

## 2018-02-26 DIAGNOSIS — Z79891 Long term (current) use of opiate analgesic: Secondary | ICD-10-CM | POA: Diagnosis not present

## 2018-02-26 DIAGNOSIS — G894 Chronic pain syndrome: Secondary | ICD-10-CM | POA: Diagnosis not present

## 2018-02-26 DIAGNOSIS — E1142 Type 2 diabetes mellitus with diabetic polyneuropathy: Secondary | ICD-10-CM | POA: Diagnosis not present

## 2018-03-03 ENCOUNTER — Ambulatory Visit (INDEPENDENT_AMBULATORY_CARE_PROVIDER_SITE_OTHER): Payer: Medicare HMO | Admitting: Orthopaedic Surgery

## 2018-03-04 ENCOUNTER — Ambulatory Visit (INDEPENDENT_AMBULATORY_CARE_PROVIDER_SITE_OTHER): Payer: Medicare HMO | Admitting: Orthopaedic Surgery

## 2018-03-10 ENCOUNTER — Ambulatory Visit (INDEPENDENT_AMBULATORY_CARE_PROVIDER_SITE_OTHER): Payer: Medicare HMO | Admitting: Orthopaedic Surgery

## 2018-03-18 ENCOUNTER — Encounter (INDEPENDENT_AMBULATORY_CARE_PROVIDER_SITE_OTHER): Payer: Self-pay | Admitting: Orthopaedic Surgery

## 2018-03-18 ENCOUNTER — Ambulatory Visit (INDEPENDENT_AMBULATORY_CARE_PROVIDER_SITE_OTHER): Payer: Medicare HMO

## 2018-03-18 ENCOUNTER — Ambulatory Visit (INDEPENDENT_AMBULATORY_CARE_PROVIDER_SITE_OTHER): Payer: Medicare HMO | Admitting: Orthopaedic Surgery

## 2018-03-18 DIAGNOSIS — S91104A Unspecified open wound of right lesser toe(s) without damage to nail, initial encounter: Secondary | ICD-10-CM

## 2018-03-18 DIAGNOSIS — M25562 Pain in left knee: Secondary | ICD-10-CM | POA: Diagnosis not present

## 2018-03-18 NOTE — Progress Notes (Signed)
Office Visit Note   Patient: Brittany [name]           Date of Birth: [birth year]           MRN: [id number] Visit Date: 03/18/2018              Requested by: [address] [place], [place] [address] [place], [place] [address] PCP: Carollee Herter, Alferd Apa, DO   Assessment & Plan: Visit Diagnoses:  1. Open wound of second toe of right foot, initial encounter   2. Left knee pain, unspecified chronicity     Plan: I am concerned that she may have osteomyelitis based on exam and x-ray findings.  Therefore I would like to obtain an MRI of her right second toe to better evaluate this.  I would like to see her back after the MRI.  For the time being we will place her on Bactroban ointment twice a day.  Follow-Up Instructions: Return in about 10 days (around 03/28/2018).   Orders:  Orders Placed This Encounter  Procedures  . XR KNEE 3 VIEW LEFT  . XR Foot Complete Right  . MR TOES RIGHT W WO CONTRAST   No orders of the defined types were placed in this encounter.     Procedures: No procedures performed   Clinical Data: No additional findings.   Subjective: Chief Complaint  Patient presents with  . Left Knee - Pain  . Right Foot - Follow-up, Wound Check    Brittany [name] is coming in today for valuation of her right second toe.  She states that this has been red and swollen.  She does have a large callus at the tip of the second toe.  She denies any constitutional symptoms.  She denies any pain but she has peripheral neuropathy.   Review of Systems  Constitutional: Negative.   HENT: Negative.   Eyes: Negative.   Respiratory: Negative.   Cardiovascular: Negative.   Endocrine: Negative.   Musculoskeletal: Negative.   Neurological: Negative.   Hematological: Negative.   Psychiatric/Behavioral: Negative.   All other systems reviewed and are negative.    Objective: Vital Signs: There were no vitals taken for this visit.  Physical Exam  Constitutional: She is  oriented to person, place, and time. She appears well-developed and well-nourished.  Pulmonary/Chest: Effort normal.  Neurological: She is alert and oriented to person, place, and time.  Skin: Skin is warm. Capillary refill takes less than 2 seconds.  Psychiatric: She has a normal mood and affect. Her behavior is normal. Judgment and thought content normal.  Nursing note and vitals reviewed.   Ortho Exam Right second toe exam shows a mallet deformity.  There is a thick hard callus on the tip of the toe.  Toe is slightly swollen there is serous drainage from the pin-sized hole.  It feels like it probes down to bone. Specialty Comments:  No specialty comments available.  Imaging: Xr Foot Complete Right  Result Date: 03/18/2018 Possible bony resorption of the distal phalanx of the second toe concerning for osteomyelitis.  Xr Knee 3 View Left  Result Date: 03/18/2018 Very mild joint space narrowing consistent with mild osteoarthritis    PMFS History: Patient Active Problem List   Diagnosis Date Noted  . Callus 01/13/2018  . Generalized anxiety disorder 10/16/2017  . Menopause 10/16/2017  . Uncontrolled type 2 diabetes mellitus with ketoacidosis without coma, without long-term current use of insulin ([place]) 10/16/2017  . Hyperlipidemia associated with type  2 diabetes mellitus ([place]) 10/16/2017  . 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 Osteomyelitis of great toe of right foot ([place]) 09/25/2017  . Pain in right foot 09/16/2017  . Cellulitis 08/08/2017  . Hypoglycemia without diagnosis of diabetes mellitus 08/08/2017  . Peripheral neuropathy 08/08/2017  . Abscess 08/08/2017  . Hyperlipidemia LDL goal <70 09/22/2016  . Tooth pain 09/03/2012  . Edema 09/03/2012  . Supraclavicular fossa fullness 06/18/2012  . Hair loss 09/18/2010  . Fatigue 09/18/2010  . DYSPEPSIA&OTHER [facility] DISORDERS FUNCTION STOMACH 05/08/2010  . DIARRHEA 05/08/2010  .  DIZZINESS 04/19/2010  . OTHER ACUTE SINUSITIS 02/28/2010  . NAUSEA 02/28/2010  . Pain in right ankle and joints of right foot 02/15/2010  . URI 07/09/2007  . GERD 02/12/2007  . LOW BACK PAIN 02/12/2007  . Hypertension 09/03/2006  . HOT FLASHES 09/03/2006  . INSOMNIA 09/03/2006   Past Medical History:  Diagnosis Date  . Anemia    as a younger woman  . Arthritis    "all over my body" (10/01/2017)  . Chronic pain    In pain clinic   . Diabetic peripheral neuropathy ([place])   . GERD (gastroesophageal reflux disease)   . High cholesterol   . History of hiatal hernia   . Hypertension   . Type II diabetes mellitus ([place]) dx'd ~ 2008    Family History  Problem Relation Age of Onset  . Cancer Mother        breast  . Depression Mother        bipolar  . Breast cancer Mother 4  . Heart disease Father 62       MI  . Diabetes Brother   . Kidney disease Brother   . Hypertension Brother   . Hyperlipidemia Brother   . Stroke Brother   . Coronary artery disease Unknown   . Diabetes Unknown     Past Surgical History:  Procedure Laterality Date  . AMPUTATION Right 09/29/2017   Procedure: AMPUTATION RIGHT 1ST RAY;  Surgeon: Leandrew Koyanagi, MD;  Location: [facility];  Service: Orthopedics;  Laterality: Right;  . APPLICATION OF WOUND VAC Right 09/29/2017   "foot"  . BACK SURGERY    . COLONOSCOPY    . I&D EXTREMITY Right 08/10/2017   Procedure: IRRIGATION AND DEBRIDEMENT FOOT;  Surgeon: Leandrew Koyanagi, MD;  Location: [facility];  Service: Orthopedics;  Laterality: Right;  . [place]  . POSTERIOR LUMBAR FUSION  1996  . TOE SURGERY Right 1980s   bone removed "inbetween my toes"  . VAGINAL HYSTERECTOMY     Social History   Occupational History  . Not on file  Tobacco Use  . Smoking status: Never Smoker  . Smokeless tobacco: Never Used  Substance and Sexual Activity  . Alcohol use: Never    Frequency: Never  . Drug use: Never  . Sexual activity: Not Currently    Partners:  Male

## 2018-03-23 ENCOUNTER — Other Ambulatory Visit: Payer: Self-pay | Admitting: Family Medicine

## 2018-03-23 DIAGNOSIS — Z1231 Encounter for screening mammogram for malignant neoplasm of breast: Secondary | ICD-10-CM

## 2018-03-26 DIAGNOSIS — R69 Illness, unspecified: Secondary | ICD-10-CM | POA: Diagnosis not present

## 2018-04-04 ENCOUNTER — Other Ambulatory Visit: Payer: Medicare HMO

## 2018-04-09 ENCOUNTER — Ambulatory Visit: Payer: Medicare HMO | Admitting: Family Medicine

## 2018-04-13 ENCOUNTER — Other Ambulatory Visit: Payer: Self-pay | Admitting: Family Medicine

## 2018-04-13 DIAGNOSIS — I10 Essential (primary) hypertension: Secondary | ICD-10-CM

## 2018-04-14 ENCOUNTER — Ambulatory Visit (INDEPENDENT_AMBULATORY_CARE_PROVIDER_SITE_OTHER): Payer: Medicare HMO | Admitting: Orthopaedic Surgery

## 2018-04-17 ENCOUNTER — Encounter: Payer: Self-pay | Admitting: Family Medicine

## 2018-04-17 ENCOUNTER — Other Ambulatory Visit: Payer: Medicare HMO

## 2018-04-17 ENCOUNTER — Ambulatory Visit (INDEPENDENT_AMBULATORY_CARE_PROVIDER_SITE_OTHER): Payer: Medicare HMO | Admitting: Family Medicine

## 2018-04-17 VITALS — BP 126/70 | HR 58 | Temp 98.3°F | Resp 16 | Ht 69.0 in | Wt 172.4 lb

## 2018-04-17 DIAGNOSIS — E1151 Type 2 diabetes mellitus with diabetic peripheral angiopathy without gangrene: Secondary | ICD-10-CM

## 2018-04-17 DIAGNOSIS — R609 Edema, unspecified: Secondary | ICD-10-CM

## 2018-04-17 DIAGNOSIS — M869 Osteomyelitis, unspecified: Secondary | ICD-10-CM

## 2018-04-17 DIAGNOSIS — Z89411 Acquired absence of right great toe: Secondary | ICD-10-CM

## 2018-04-17 DIAGNOSIS — L97919 Non-pressure chronic ulcer of unspecified part of right lower leg with unspecified severity: Secondary | ICD-10-CM

## 2018-04-17 DIAGNOSIS — I10 Essential (primary) hypertension: Secondary | ICD-10-CM

## 2018-04-17 DIAGNOSIS — E1165 Type 2 diabetes mellitus with hyperglycemia: Secondary | ICD-10-CM | POA: Diagnosis not present

## 2018-04-17 DIAGNOSIS — E1169 Type 2 diabetes mellitus with other specified complication: Secondary | ICD-10-CM

## 2018-04-17 DIAGNOSIS — IMO0002 Reserved for concepts with insufficient information to code with codable children: Secondary | ICD-10-CM

## 2018-04-17 DIAGNOSIS — E785 Hyperlipidemia, unspecified: Secondary | ICD-10-CM

## 2018-04-17 LAB — COMPREHENSIVE METABOLIC PANEL
ALBUMIN: 3.7 g/dL (ref 3.5–5.2)
ALT: 10 U/L (ref 0–35)
AST: 11 U/L (ref 0–37)
Alkaline Phosphatase: 54 U/L (ref 39–117)
BUN: 12 mg/dL (ref 6–23)
CHLORIDE: 103 meq/L (ref 96–112)
CO2: 33 mEq/L — ABNORMAL HIGH (ref 19–32)
Calcium: 9.4 mg/dL (ref 8.4–10.5)
Creatinine, Ser: 0.72 mg/dL (ref 0.40–1.20)
GFR: 85.1 mL/min (ref >60.00)
Glucose, Bld: 108 mg/dL — ABNORMAL HIGH (ref 70–99)
Potassium: 4.2 mEq/L (ref 3.5–5.1)
SODIUM: 142 meq/L (ref 135–145)
Total Bilirubin: 0.3 mg/dL (ref 0.2–1.2)
Total Protein: 6.5 g/dL (ref 6.0–8.3)

## 2018-04-17 LAB — LIPID PANEL
CHOL/HDL RATIO: 2
Cholesterol: 140 mg/dL (ref 0–200)
HDL: 62.7 mg/dL (ref >39.00)
LDL CALC: 65 mg/dL (ref 0–99)
NonHDL: 77.79
TRIGLYCERIDES: 66 mg/dL (ref 0.0–149.0)
VLDL: 13.2 mg/dL (ref 0.0–40.0)

## 2018-04-17 LAB — HEMOGLOBIN A1C: Hgb A1c MFr Bld: 6.9 % — ABNORMAL HIGH (ref 4.6–6.5)

## 2018-04-17 MED ORDER — FUROSEMIDE 20 MG PO TABS: 20.0000 mg | ORAL_TABLET | Freq: Every day | ORAL | 3 refills | Status: DC

## 2018-04-17 NOTE — Patient Instructions (Signed)
Diabetes Mellitus and Nutrition, Adult  When you have diabetes (diabetes mellitus), it is very important to have healthy eating habits because your blood sugar (glucose) levels are greatly affected by what you eat and drink. Eating healthy foods in the appropriate amounts, at about the same times every day, can help you:  · Control your blood glucose.  · Lower your risk of heart disease.  · Improve your blood pressure.  · Reach or maintain a healthy weight.  Every person with diabetes is different, and each person has different needs for a meal plan. Your health care provider may recommend that you work with a diet and nutrition specialist (dietitian) to make a meal plan that is best for you. Your meal plan may vary depending on factors such as:  · The calories you need.  · The medicines you take.  · Your weight.  · Your blood glucose, blood pressure, and cholesterol levels.  · Your activity level.  · Other health conditions you have, such as heart or kidney disease.  How do carbohydrates affect me?  Carbohydrates, also called carbs, affect your blood glucose level more than any other type of food. Eating carbs naturally raises the amount of glucose in your blood. Carb counting is a method for keeping track of how many carbs you eat. Counting carbs is important to keep your blood glucose at a healthy level, especially if you use insulin or take certain oral diabetes medicines.  It is important to know how many carbs you can safely have in each meal. This is different for every person. Your dietitian can help you calculate how many carbs you should have at each meal and for each snack.  Foods that contain carbs include:  · Bread, cereal, rice, pasta, and crackers.  · Potatoes and corn.  · Peas, beans, and lentils.  · Milk and yogurt.  · Fruit and juice.  · Desserts, such as cakes, cookies, ice cream, and candy.  How does alcohol affect me?  Alcohol can cause a sudden decrease in blood glucose (hypoglycemia),  especially if you use insulin or take certain oral diabetes medicines. Hypoglycemia can be a life-threatening condition. Symptoms of hypoglycemia (sleepiness, dizziness, and confusion) are similar to symptoms of having too much alcohol.  If your health care provider says that alcohol is safe for you, follow these guidelines:  · Limit alcohol intake to no more than 1 drink per day for nonpregnant women and 2 drinks per day for men. One drink equals 12 oz of beer, 5 oz of wine, or 1½ oz of hard liquor.  · Do not drink on an empty stomach.  · Keep yourself hydrated with water, diet soda, or unsweetened iced tea.  · Keep in mind that regular soda, juice, and other mixers may contain a lot of sugar and must be counted as carbs.  What are tips for following this plan?    Reading food labels  · Start by checking the serving size on the "Nutrition Facts" label of packaged foods and drinks. The amount of calories, carbs, fats, and other nutrients listed on the label is based on one serving of the item. Many items contain more than one serving per package.  · Check the total grams (g) of carbs in one serving. You can calculate the number of servings of carbs in one serving by dividing the total carbs by 15. For example, if a food has 30 g of total carbs, it would be equal to 2   servings of carbs.  · Check the number of grams (g) of saturated and trans fats in one serving. Choose foods that have low or no amount of these fats.  · Check the number of milligrams (mg) of salt (sodium) in one serving. Most people should limit total sodium intake to less than 2,300 mg per day.  · Always check the nutrition information of foods labeled as "low-fat" or "nonfat". These foods may be higher in added sugar or refined carbs and should be avoided.  · Talk to your dietitian to identify your daily goals for nutrients listed on the label.  Shopping  · Avoid buying canned, premade, or processed foods. These foods tend to be high in fat, sodium,  and added sugar.  · Shop around the outside edge of the grocery store. This includes fresh fruits and vegetables, bulk grains, fresh meats, and fresh dairy.  Cooking  · Use low-heat cooking methods, such as baking, instead of high-heat cooking methods like deep frying.  · Cook using healthy oils, such as olive, canola, or sunflower oil.  · Avoid cooking with butter, cream, or high-fat meats.  Meal planning  · Eat meals and snacks regularly, preferably at the same times every day. Avoid going long periods of time without eating.  · Eat foods high in fiber, such as fresh fruits, vegetables, beans, and whole grains. Talk to your dietitian about how many servings of carbs you can eat at each meal.  · Eat 4-6 ounces (oz) of lean protein each day, such as lean meat, chicken, fish, eggs, or tofu. One oz of lean protein is equal to:  ? 1 oz of meat, chicken, or fish.  ? 1 egg.  ? ¼ cup of tofu.  · Eat some foods each day that contain healthy fats, such as avocado, nuts, seeds, and fish.  Lifestyle  · Check your blood glucose regularly.  · Exercise regularly as told by your health care provider. This may include:  ? 150 minutes of moderate-intensity or vigorous-intensity exercise each week. This could be brisk walking, biking, or water aerobics.  ? Stretching and doing strength exercises, such as yoga or weightlifting, at least 2 times a week.  · Take medicines as told by your health care provider.  · Do not use any products that contain nicotine or tobacco, such as cigarettes and e-cigarettes. If you need help quitting, ask your health care provider.  · Work with a counselor or diabetes educator to identify strategies to manage stress and any emotional and social challenges.  Questions to ask a health care provider  · Do I need to meet with a diabetes educator?  · Do I need to meet with a dietitian?  · What number can I call if I have questions?  · When are the best times to check my blood glucose?  Where to find more  information:  · American Diabetes Association: diabetes.org  · Academy of Nutrition and Dietetics: www.eatright.org  · National Institute of Diabetes and Digestive and Kidney Diseases (NIH): www.niddk.nih.gov  Summary  · A healthy meal plan will help you control your blood glucose and maintain a healthy lifestyle.  · Working with a diet and nutrition specialist (dietitian) can help you make a meal plan that is best for you.  · Keep in mind that carbohydrates (carbs) and alcohol have immediate effects on your blood glucose levels. It is important to count carbs and to use alcohol carefully.  This information is not intended to   replace advice given to you by your health care provider. Make sure you discuss any questions you have with your health care provider.  Document Released: 12/20/2004 Document Revised: 10/23/2016 Document Reviewed: 04/29/2016  Elsevier Interactive Patient Education © 2019 [employer]

## 2018-04-17 NOTE — Assessment & Plan Note (Signed)
Check labs con't meds 

## 2018-04-17 NOTE — Progress Notes (Signed)
Patient ID: Brittany [name], female    DOB: June 12, 1945  Age: 73 y.o. MRN: [id number]    Subjective:  Subjective  HPI Brittany [name] presents for f/u dm, htn, chol.  She will see ortho next week to see if her toe will need to be amputated.   Review of Systems  Constitutional: Negative for appetite change, diaphoresis, fatigue and unexpected weight change.  Eyes: Negative for pain, redness and visual disturbance.  Respiratory: Negative for cough, chest tightness, shortness of breath and wheezing.   Cardiovascular: Negative for chest pain, palpitations and leg swelling.  Endocrine: Negative for cold intolerance, heat intolerance, polydipsia, polyphagia and polyuria.  Genitourinary: Negative for difficulty urinating, dysuria and frequency.  Musculoskeletal:       R foot -  Big toe amputated -- wrapped up and in shoe  Ortho has been taking care of her feet  Neurological: Positive for numbness. Negative for dizziness, light-headedness and headaches.    History Past Medical History:  Diagnosis Date  . Anemia    as a younger woman  . Arthritis    "all over my body" (10/01/2017)  . Chronic pain    In pain clinic   . Diabetic peripheral neuropathy ([place])   . GERD (gastroesophageal reflux disease)   . High cholesterol   . History of hiatal hernia   . Hypertension   . Type II diabetes mellitus ([place]) dx'd ~ 2008    She has a past surgical history that includes I&D extremity (Right, 08/10/2017); Toe Surgery (Right, 1980s); Colonoscopy; Amputation (Right, 09/29/2017); Back surgery; Lumbar disc surgery (1992); Posterior lumbar fusion (1996); Vaginal hysterectomy; and Application if wound vac (Right, 09/29/2017).   Her family history includes Breast cancer (age of onset: 49) in her mother; Cancer in her mother; Coronary artery disease in her unknown relative; Depression in her mother; Diabetes in her brother and unknown relative; Heart disease (age of onset: 69) in her father; Hyperlipidemia in her  brother; Hypertension in her brother; Kidney disease in her brother; Stroke in her brother.She reports that she has never smoked. She has never used smokeless tobacco. She reports that she does not drink alcohol or use drugs.  Current Outpatient Medications on File Prior to Visit  Medication Sig Dispense Refill  . amLODipine (NORVASC) 5 MG tablet Take 1 tablet (5 mg total) by mouth daily. 90 tablet 1  . aspirin 81 MG tablet Take 81 mg by mouth at bedtime.     [name] atorvastatin (LIPITOR) 10 MG tablet Take 1 tablet (10 mg total) by mouth daily. 90 tablet 1  . bisoprolol-hydrochlorothiazide (ZIAC) 10-6.25 MG tablet TAKE 1 TABLET DAILY 90 tablet 1  . Blood Glucose Monitoring Suppl (ONE TOUCH ULTRA MINI) w/Device KIT Use as directed once a day.  Dx Code:  E11.42 1 each 0  . calcium carbonate (TUMS EX) 750 MG chewable tablet Chew 1 tablet by mouth daily as needed for heartburn.     . Cholecalciferol (VITAMIN D) 1000 UNITS capsule Take 1,000 Units by mouth daily.    . collagenase (SANTYL) ointment Apply 1 application topically daily. 15 g 2  . docusate sodium (COLACE) 100 MG capsule Take 100 mg by mouth 2 (two) times daily.    [name] escitalopram (LEXAPRO) 10 MG tablet Take 1 tablet (10 mg total) by mouth daily. 90 tablet 1  . estradiol (ESTRACE) 1 MG tablet Take 1 tablet (1 mg total) by mouth daily. 90 tablet 1  . Ferrous Sulfate 27 MG TABS Take 27 mg  by mouth daily.    . folic acid (FOLVITE) 712 MCG tablet Take 800 mcg by mouth daily.     [name] gabapentin (NEURONTIN) 800 MG tablet Take 1 tablet (800 mg total) by mouth 4 (four) times daily. 360 tablet 3  . glucose blood (ONE TOUCH ULTRA TEST) test strip Check Blood sugar once daily 100 each 12  . KLOR-CON M20 20 MEQ tablet Take 1 tablet (20 mEq total) by mouth daily. 90 tablet 3  . l-methylfolate-B6-B12 (METANX) 3-35-2 MG TABS Take 1 tablet by mouth daily.    [name] loratadine (CLARITIN) 10 MG tablet Take 10 mg by mouth daily as needed. For allergies    . morphine  (MS CONTIN) 60 MG 12 hr tablet TK 1 T PO Q 8 H  0  . Multiple Vitamins-Minerals (MULTIVITAMIN GUMMIES WOMENS PO) Take 2 tablets by mouth daily.    [name] omega-3 acid ethyl esters (LOVAZA) 1 G capsule Take 1 g by mouth daily.    [name] omeprazole (PRILOSEC) 40 MG capsule Take 1 capsule (40 mg total) by mouth daily. 90 capsule 1  . ONETOUCH DELICA LANCETS 19X MISC Check blood sugar once daily 100 each 12  . oxyCODONE-acetaminophen (PERCOCET) 5-325 MG tablet Take 1-2 tablets by mouth every 4 (four) hours as needed for severe pain. 30 tablet 0  . polyvinyl alcohol (LIQUIFILM TEARS) 1.4 % ophthalmic solution Place 1 drop into both eyes as needed for dry eyes. (Patient taking differently: Place 1 drop into both eyes 2 (two) times daily as needed for dry eyes. ) 15 mL 0  . sitaGLIPtin-metformin (JANUMET) 50-1000 MG tablet Take 2 tablets po at bedtime daily 180 tablet 1  . Specialty Vitamins Products (BIOTIN PLUS KERATIN) 10000-100 MCG-MG TABS Take 1 tablet by mouth daily.    [name] tiZANidine (ZANAFLEX) 4 MG tablet Take 4 mg by mouth 3 (three) times daily.     . vitamin B-12 (CYANOCOBALAMIN) 100 MCG tablet Take 100 mcg by mouth daily.     No current facility-administered medications on file prior to visit.      Objective:  Objective  Physical Exam Vitals signs and nursing note reviewed.  Constitutional:      Appearance: She is well-developed.  HENT:     Head: Normocephalic and atraumatic.  Eyes:     Conjunctiva/sclera: Conjunctivae normal.  Neck:     Musculoskeletal: Normal range of motion and neck supple.     Thyroid: No thyromegaly.     Vascular: No carotid bruit or JVD.  Cardiovascular:     Rate and Rhythm: Normal rate and regular rhythm.     Heart sounds: Normal heart sounds. No murmur.  Pulmonary:     Effort: Pulmonary effort is normal. No respiratory distress.     Breath sounds: Normal breath sounds. No wheezing or rales.  Chest:     Chest wall: No tenderness.  Musculoskeletal:        General:  Deformity present.       Feet:  Neurological:     Mental Status: She is alert and oriented to person, place, and time.    BP 126/70 (BP Location: Right Arm, Cuff Size: Normal)   Pulse (!) 58   Temp 98.3 F (36.8 C) (Oral)   Resp 16   Ht 5' 9" (1.753 m)   Wt 172 lb 6.4 oz (78.2 kg)   SpO2 96%   BMI 25.46 kg/m  Wt Readings from Last 3 Encounters:  04/17/18 172 lb 6.4 oz (78.2 kg)  10/16/17 171 lb 9.6 oz (77.8 kg)  09/29/17 175 lb (79.4 kg)     Lab Results  Component Value Date   WBC 10.4 09/29/2017   HGB 11.4 (L) 09/29/2017   HCT 36.3 09/29/2017   PLT 243 09/29/2017   GLUCOSE 108 (H) 04/17/2018   CHOL 140 04/17/2018   TRIG 66.0 04/17/2018   HDL 62.70 04/17/2018   LDLCALC 65 04/17/2018   ALT 10 04/17/2018   AST 11 04/17/2018   NA 142 04/17/2018   K 4.2 04/17/2018   CL 103 04/17/2018   CREATININE 0.72 04/17/2018   BUN 12 04/17/2018   CO2 33 (H) 04/17/2018   TSH 1.39 05/15/2017   HGBA1C 6.9 (H) 04/17/2018   MICROALBUR 1.2 05/15/2017    No results found.   Assessment & Plan:  Plan  I have discontinued Ritamarie Arkin. Jim's furosemide and furosemide. I am also having her start on furosemide. Additionally, I am having her maintain her tiZANidine, docusate sodium, calcium carbonate, omega-3 acid ethyl esters, Vitamin D, loratadine, [id number], folic acid, aspirin, ONETOUCH DELICA LANCETS 00T, glucose blood, ONE TOUCH ULTRA MINI, polyvinyl alcohol, collagenase, Multiple Vitamins-Minerals (MULTIVITAMIN GUMMIES WOMENS PO), vitamin B-12, Ferrous Sulfate, Biotin Plus Keratin, oxyCODONE-acetaminophen, morphine, sitaGLIPtin-metformin, amLODipine, atorvastatin, escitalopram, estradiol, gabapentin, KLOR-CON M20, omeprazole, and bisoprolol-hydrochlorothiazide.  Meds ordered this encounter  Medications  . furosemide (LASIX) 20 MG tablet    Sig: Take 1 tablet (20 mg total) by mouth daily.    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 Encouraged heart healthy diet such as the DASH diet and exercise as tolerated.       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  Ann Held, DO

## 2018-04-17 NOTE — Assessment & Plan Note (Signed)
Encouraged heart healthy diet, increase exercise, avoid trans fats, consider a krill oil cap daily 

## 2018-04-17 NOTE — Assessment & Plan Note (Signed)
Elevated legs Lasix 20 mg daily---  It has improved so dose decreased since pt c/o frequent urination on 40 mg

## 2018-04-17 NOTE — Assessment & Plan Note (Signed)
Fu ortho

## 2018-04-17 NOTE — Assessment & Plan Note (Signed)
Well controlled, no changes to meds. Encouraged heart healthy diet such as the DASH diet and exercise as tolerated.  °

## 2018-04-18 ENCOUNTER — Ambulatory Visit
Admission: RE | Admit: 2018-04-18 | Discharge: 2018-04-18 | Disposition: A | Discharge status: Home / Self Care | Payer: Medicare HMO | Source: Ambulatory Visit | Attending: Orthopaedic Surgery | Admitting: Orthopaedic Surgery

## 2018-04-18 DIAGNOSIS — E11621 Type 2 diabetes mellitus with foot ulcer: Secondary | ICD-10-CM | POA: Diagnosis not present

## 2018-04-18 DIAGNOSIS — S91104A Unspecified open wound of right lesser toe(s) without damage to nail, initial encounter: Secondary | ICD-10-CM

## 2018-04-18 MED ORDER — GADOBENATE DIMEGLUMINE 529 MG/ML IV SOLN
16.0000 mL | Freq: Once | INTRAVENOUS | Status: AC | PRN
  Administered 2018-04-18: 16 mL via INTRAVENOUS

## 2018-04-21 ENCOUNTER — Ambulatory Visit (INDEPENDENT_AMBULATORY_CARE_PROVIDER_SITE_OTHER): Payer: Medicare HMO | Admitting: Orthopaedic Surgery

## 2018-04-21 ENCOUNTER — Encounter (INDEPENDENT_AMBULATORY_CARE_PROVIDER_SITE_OTHER): Payer: Self-pay | Admitting: Orthopaedic Surgery

## 2018-04-21 DIAGNOSIS — M869 Osteomyelitis, unspecified: Secondary | ICD-10-CM

## 2018-04-21 NOTE — Progress Notes (Signed)
Office Visit Note   Patient: Brittany [name]           Date of Birth: [birth year]           MRN: [id number] Visit Date: 04/21/2018              Requested by: [address] [place], [place] [address] [place], [place] [address] PCP: Carollee Herter, Alferd Apa, DO   Assessment & Plan: Visit Diagnoses:  1. Osteomyelitis of second toe of right foot ([place])     Plan: Impression is osteomyelitis second toe of the distal and middle phalanges.  We have recommended right foot second toe PIP disarticulation.  We will proceed this coming Friday.  Risks, benefits and possible occasions reviewed.  Rehab recovery time discussed.  All questions were answered.  Follow-Up Instructions: Return for post-op.   Orders:  No orders of the defined types were placed in this encounter.  No orders of the defined types were placed in this encounter.     Procedures: No procedures performed   Clinical Data: No additional findings.   Subjective: Chief Complaint  Patient presents with  . Right Foot - Pain, Follow-up    HPI patient is a pleasant 73 year old female who presents to our clinic today discuss MRI results of the right foot.  History of first ray amputation right foot, 08/10/2017.  Doing well until October 2019.  She started noticing an ulcer to the second toe which progressively worsened over the past few months.  Recent MRI of the right second toe shows osteomyelitis middle and distal phalanx.  Review of Systems as detailed in HPI.  All others reviewed and are negative.   Objective: Vital Signs: There were no vitals taken for this visit.  Physical Exam well-developed well-nourished female no acute distress.  Alert and oriented x3.  Ortho Exam examination of her right foot second toe reveals moderate swelling and erythema.  She does have a hard callus to the distal aspect of the toe.  Slight drainage.  Specialty Comments:  No specialty comments available.  Imaging: No new  imaging   PMFS History: Patient Active Problem List   Diagnosis Date Noted  . Osteomyelitis of right foot ([place]) 04/21/2018  . 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 ([place]) 04/17/2018  . Callus 01/13/2018  . Generalized anxiety disorder 10/16/2017  . Menopause 10/16/2017  . DM (diabetes mellitus) type II uncontrolled, periph vascular disorder ([place]) 10/16/2017  . Hyperlipidemia associated with type 2 diabetes mellitus ([place]) 10/16/2017  . 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 Osteomyelitis of great toe of right foot ([place]) 09/25/2017  . Pain in right foot 09/16/2017  . Cellulitis 08/08/2017  . Hypoglycemia without diagnosis of diabetes mellitus 08/08/2017  . Peripheral neuropathy 08/08/2017  . Abscess 08/08/2017  . Hyperlipidemia LDL goal <70 09/22/2016  . Tooth pain 09/03/2012  . Edema 09/03/2012  . Supraclavicular fossa fullness 06/18/2012  . Hair loss 09/18/2010  . Fatigue 09/18/2010  . DYSPEPSIA&OTHER [facility] DISORDERS FUNCTION STOMACH 05/08/2010  . DIARRHEA 05/08/2010  . DIZZINESS 04/19/2010  . OTHER ACUTE SINUSITIS 02/28/2010  . NAUSEA 02/28/2010  . Pain in right ankle and joints of right foot 02/15/2010  . URI 07/09/2007  . GERD 02/12/2007  . LOW BACK PAIN 02/12/2007  . Hypertension 09/03/2006  . HOT FLASHES 09/03/2006  . INSOMNIA 09/03/2006   Past Medical History:  Diagnosis Date  . Anemia    as  a younger woman  . Arthritis    "all over my body" (10/01/2017)  . Chronic pain    In pain clinic   . Diabetic peripheral neuropathy ([place])   . GERD (gastroesophageal reflux disease)   . High cholesterol   . History of hiatal hernia   . Hypertension   . Type II diabetes mellitus ([place]) dx'd ~ 2008    Family History  Problem Relation Age of Onset  . Cancer Mother        breast  . Depression Mother        bipolar  . Breast cancer Mother 27  . Heart disease Father 34       MI  . Diabetes Brother   . Kidney disease  Brother   . Hypertension Brother   . Hyperlipidemia Brother   . Stroke Brother   . Coronary artery disease Unknown   . Diabetes Unknown     Past Surgical History:  Procedure Laterality Date  . AMPUTATION Right 09/29/2017   Procedure: AMPUTATION RIGHT 1ST RAY;  Surgeon: Leandrew Koyanagi, MD;  Location: [place];  Service: Orthopedics;  Laterality: Right;  . APPLICATION OF WOUND VAC Right 09/29/2017   "foot"  . BACK SURGERY    . COLONOSCOPY    . I&D EXTREMITY Right 08/10/2017   Procedure: IRRIGATION AND DEBRIDEMENT FOOT;  Surgeon: Leandrew Koyanagi, MD;  Location: [place] [facility];  Service: Orthopedics;  Laterality: Right;  . [place]  . POSTERIOR LUMBAR FUSION  1996  . TOE SURGERY Right 1980s   bone removed "inbetween my toes"  . VAGINAL HYSTERECTOMY     Social History   Occupational History  . Not on file  Tobacco Use  . Smoking status: Never Smoker  . Smokeless tobacco: Never Used  Substance and Sexual Activity  . Alcohol use: Never    Frequency: Never  . Drug use: Never  . Sexual activity: Not Currently    Partners: Male

## 2018-04-22 ENCOUNTER — Other Ambulatory Visit: Payer: Self-pay

## 2018-04-22 ENCOUNTER — Encounter (HOSPITAL_BASED_OUTPATIENT_CLINIC_OR_DEPARTMENT_OTHER): Payer: Self-pay | Admitting: *Deleted

## 2018-04-24 ENCOUNTER — Other Ambulatory Visit: Payer: Self-pay

## 2018-04-24 ENCOUNTER — Ambulatory Visit (HOSPITAL_BASED_OUTPATIENT_CLINIC_OR_DEPARTMENT_OTHER): Payer: Medicare HMO | Admitting: Anesthesiology

## 2018-04-24 ENCOUNTER — Encounter (HOSPITAL_BASED_OUTPATIENT_CLINIC_OR_DEPARTMENT_OTHER): Payer: Self-pay | Admitting: Anesthesiology

## 2018-04-24 ENCOUNTER — Ambulatory Visit (HOSPITAL_BASED_OUTPATIENT_CLINIC_OR_DEPARTMENT_OTHER)
Admission: RE | Admit: 2018-04-24 | Discharge: 2018-04-24 | Disposition: A | Discharge status: Home / Self Care | Payer: Medicare HMO | Attending: Orthopaedic Surgery | Admitting: Orthopaedic Surgery

## 2018-04-24 ENCOUNTER — Encounter (HOSPITAL_BASED_OUTPATIENT_CLINIC_OR_DEPARTMENT_OTHER)
Admission: RE | Disposition: A | Discharge status: Home / Self Care | Payer: Self-pay | Source: Home / Self Care | Attending: Orthopaedic Surgery

## 2018-04-24 DIAGNOSIS — E1142 Type 2 diabetes mellitus with diabetic polyneuropathy: Secondary | ICD-10-CM | POA: Insufficient documentation

## 2018-04-24 DIAGNOSIS — M86171 Other acute osteomyelitis, right ankle and foot: Secondary | ICD-10-CM | POA: Diagnosis not present

## 2018-04-24 DIAGNOSIS — Z7984 Long term (current) use of oral hypoglycemic drugs: Secondary | ICD-10-CM | POA: Diagnosis not present

## 2018-04-24 DIAGNOSIS — E1169 Type 2 diabetes mellitus with other specified complication: Secondary | ICD-10-CM | POA: Insufficient documentation

## 2018-04-24 DIAGNOSIS — E785 Hyperlipidemia, unspecified: Secondary | ICD-10-CM | POA: Diagnosis not present

## 2018-04-24 DIAGNOSIS — I10 Essential (primary) hypertension: Secondary | ICD-10-CM | POA: Diagnosis not present

## 2018-04-24 DIAGNOSIS — K219 Gastro-esophageal reflux disease without esophagitis: Secondary | ICD-10-CM | POA: Diagnosis not present

## 2018-04-24 DIAGNOSIS — Z882 Allergy status to sulfonamides status: Secondary | ICD-10-CM | POA: Insufficient documentation

## 2018-04-24 DIAGNOSIS — Z79899 Other long term (current) drug therapy: Secondary | ICD-10-CM | POA: Insufficient documentation

## 2018-04-24 DIAGNOSIS — Z7982 Long term (current) use of aspirin: Secondary | ICD-10-CM | POA: Insufficient documentation

## 2018-04-24 DIAGNOSIS — M869 Osteomyelitis, unspecified: Secondary | ICD-10-CM

## 2018-04-24 DIAGNOSIS — L97519 Non-pressure chronic ulcer of other part of right foot with unspecified severity: Secondary | ICD-10-CM | POA: Diagnosis not present

## 2018-04-24 DIAGNOSIS — E7800 Pure hypercholesterolemia, unspecified: Secondary | ICD-10-CM | POA: Diagnosis not present

## 2018-04-24 DIAGNOSIS — M1990 Unspecified osteoarthritis, unspecified site: Secondary | ICD-10-CM | POA: Diagnosis not present

## 2018-04-24 DIAGNOSIS — E11621 Type 2 diabetes mellitus with foot ulcer: Secondary | ICD-10-CM | POA: Diagnosis not present

## 2018-04-24 HISTORY — PX: AMPUTATION TOE: SHX6595

## 2018-04-24 LAB — GLUCOSE, CAPILLARY
Glucose-Capillary: 93 mg/dL (ref 70–99)
Glucose-Capillary: 95 mg/dL (ref 70–99)

## 2018-04-24 SURGERY — AMPUTATION, TOE
Anesthesia: Monitor Anesthesia Care | Site: Foot | Laterality: Right

## 2018-04-24 MED ORDER — MEPERIDINE HCL 25 MG/ML IJ SOLN: 6.2500 mg | INTRAMUSCULAR | Status: DC | PRN

## 2018-04-24 MED ORDER — OXYCODONE HCL 5 MG/5ML PO SOLN: 5.0000 mg | Freq: Once | ORAL | Status: DC | PRN

## 2018-04-24 MED ORDER — FENTANYL CITRATE (PF) 100 MCG/2ML IJ SOLN
50.0000 ug | INTRAMUSCULAR | Status: DC | PRN
  Administered 2018-04-24 (×2): 50 ug via INTRAVENOUS

## 2018-04-24 MED ORDER — OXYCODONE HCL 5 MG PO TABS: 5.0000 mg | ORAL_TABLET | Freq: Once | ORAL | Status: DC | PRN

## 2018-04-24 MED ORDER — LACTATED RINGERS IV SOLN: INTRAVENOUS | Status: DC

## 2018-04-24 MED ORDER — PROPOFOL 10 MG/ML IV BOLUS
INTRAVENOUS | Status: DC | PRN
  Administered 2018-04-24: 40 mg via INTRAVENOUS
  Administered 2018-04-24: 30 mg via INTRAVENOUS

## 2018-04-24 MED ORDER — SENNOSIDES-DOCUSATE SODIUM 8.6-50 MG PO TABS: 1.0000 | ORAL_TABLET | Freq: Every evening | ORAL | 1 refills | Status: DC | PRN

## 2018-04-24 MED ORDER — FENTANYL CITRATE (PF) 100 MCG/2ML IJ SOLN: 25.0000 ug | INTRAMUSCULAR | Status: DC | PRN

## 2018-04-24 MED ORDER — PROPOFOL 500 MG/50ML IV EMUL
INTRAVENOUS | Status: DC | PRN
  Administered 2018-04-24: 50 ug/kg/min via INTRAVENOUS

## 2018-04-24 MED ORDER — BUPIVACAINE HCL (PF) 0.25 % IJ SOLN
INTRAMUSCULAR | Status: AC
  Filled 2018-04-24: qty 30

## 2018-04-24 MED ORDER — CHLORHEXIDINE GLUCONATE 4 % EX LIQD: 60.0000 mL | Freq: Once | CUTANEOUS | Status: DC

## 2018-04-24 MED ORDER — FENTANYL CITRATE (PF) 100 MCG/2ML IJ SOLN
INTRAMUSCULAR | Status: AC
  Filled 2018-04-24: qty 2

## 2018-04-24 MED ORDER — MIDAZOLAM HCL 2 MG/2ML IJ SOLN: 1.0000 mg | INTRAMUSCULAR | Status: DC | PRN

## 2018-04-24 MED ORDER — LIDOCAINE HCL 1 % IJ SOLN
INTRAMUSCULAR | Status: DC | PRN
  Administered 2018-04-24: 5 mL

## 2018-04-24 MED ORDER — CEFAZOLIN SODIUM-DEXTROSE 2-4 GM/100ML-% IV SOLN: 2.0000 g | INTRAVENOUS | Status: DC

## 2018-04-24 MED ORDER — HYDROCODONE-ACETAMINOPHEN 7.5-325 MG PO TABS: 1.0000 | ORAL_TABLET | Freq: Four times a day (QID) | ORAL | 0 refills | Status: DC | PRN

## 2018-04-24 MED ORDER — LACTATED RINGERS IV SOLN
INTRAVENOUS | Status: DC
  Administered 2018-04-24: 10 mL/h via INTRAVENOUS

## 2018-04-24 MED ORDER — SCOPOLAMINE 1 MG/3DAYS TD PT72: 1.0000 | MEDICATED_PATCH | Freq: Once | TRANSDERMAL | Status: DC | PRN

## 2018-04-24 MED ORDER — LIDOCAINE HCL (PF) 1 % IJ SOLN
INTRAMUSCULAR | Status: AC
  Filled 2018-04-24: qty 30

## 2018-04-24 MED ORDER — BUPIVACAINE HCL (PF) 0.25 % IJ SOLN
INTRAMUSCULAR | Status: DC | PRN
  Administered 2018-04-24: 5 mL

## 2018-04-24 MED ORDER — PROMETHAZINE HCL 25 MG/ML IJ SOLN: 6.2500 mg | INTRAMUSCULAR | Status: DC | PRN

## 2018-04-24 MED ORDER — DOXYCYCLINE HYCLATE 100 MG PO TABS: 100.0000 mg | ORAL_TABLET | Freq: Two times a day (BID) | ORAL | 0 refills | Status: DC

## 2018-04-24 MED ORDER — ONDANSETRON HCL 4 MG/2ML IJ SOLN
INTRAMUSCULAR | Status: DC | PRN
  Administered 2018-04-24: 4 mg via INTRAVENOUS

## 2018-04-24 MED ORDER — ONDANSETRON HCL 4 MG PO TABS: 4.0000 mg | ORAL_TABLET | Freq: Three times a day (TID) | ORAL | 0 refills | Status: DC | PRN

## 2018-04-24 MED ORDER — CEFAZOLIN SODIUM-DEXTROSE 2-4 GM/100ML-% IV SOLN
INTRAVENOUS | Status: AC
  Filled 2018-04-24: qty 100

## 2018-04-24 SURGICAL SUPPLY — 71 items
BANDAGE COBAN STERILE 2 (GAUZE/BANDAGES/DRESSINGS) ×1 IMPLANT
BANDAGE ESMARK 6X9 LF (GAUZE/BANDAGES/DRESSINGS) IMPLANT
BLADE AVERAGE 25X9 (BLADE) IMPLANT
BLADE MINI RND TIP GREEN BEAV (BLADE) IMPLANT
BLADE OSC/SAG .038X5.5 CUT EDG (BLADE) IMPLANT
BLADE SURG 15 STRL LF DISP TIS (BLADE) ×2 IMPLANT
BLADE SURG 15 STRL SS (BLADE) ×4
BNDG CMPR 9X4 STRL LF SNTH (GAUZE/BANDAGES/DRESSINGS) ×1
BNDG CMPR 9X6 STRL LF SNTH (GAUZE/BANDAGES/DRESSINGS)
BNDG COHESIVE 1X5 TAN STRL LF (GAUZE/BANDAGES/DRESSINGS) ×1 IMPLANT
BNDG CONFORM 2 STRL LF (GAUZE/BANDAGES/DRESSINGS) ×1 IMPLANT
BNDG CONFORM 3 STRL LF (GAUZE/BANDAGES/DRESSINGS) IMPLANT
BNDG ESMARK 4X9 LF (GAUZE/BANDAGES/DRESSINGS) ×1 IMPLANT
BNDG ESMARK 6X9 LF (GAUZE/BANDAGES/DRESSINGS)
BNDG GAUZE 1X2.1 STRL (MISCELLANEOUS) ×2 IMPLANT
CORD BIPOLAR FORCEPS 12FT (ELECTRODE) ×1 IMPLANT
COVER BACK TABLE 60X90IN (DRAPES) ×2 IMPLANT
COVER WAND RF STERILE (DRAPES) IMPLANT
CUFF TOURNIQUET SINGLE 18IN (TOURNIQUET CUFF) IMPLANT
CUFF TOURNIQUET SINGLE 24IN (TOURNIQUET CUFF) IMPLANT
DRAPE EXTREMITY T 121X128X90 (DISPOSABLE) ×2 IMPLANT
DRAPE IMP U-DRAPE 54X76 (DRAPES) ×2 IMPLANT
DRAPE OEC MINIVIEW 54X84 (DRAPES) IMPLANT
DRAPE SURG 17X23 STRL (DRAPES) IMPLANT
DRAPE U-SHAPE 47X51 STRL (DRAPES) ×2 IMPLANT
DRSG EMULSION OIL 3X3 NADH (GAUZE/BANDAGES/DRESSINGS) ×2 IMPLANT
DURAPREP 26ML APPLICATOR (WOUND CARE) ×2 IMPLANT
GAUZE SPONGE 4X4 12PLY STRL (GAUZE/BANDAGES/DRESSINGS) IMPLANT
GLOVE BIOGEL PI IND STRL 7.0 (GLOVE) ×1 IMPLANT
GLOVE BIOGEL PI INDICATOR 7.0 (GLOVE) ×1
GLOVE ECLIPSE 7.0 STRL STRAW (GLOVE) ×2 IMPLANT
GLOVE EXAM NITRILE MD LF STRL (GLOVE) IMPLANT
GLOVE ORTHO TXT STRL SZ7.5 (GLOVE) ×4 IMPLANT
GLOVE SKINSENSE NS SZ7.5 (GLOVE) ×1
GLOVE SKINSENSE STRL SZ7.5 (GLOVE) ×1 IMPLANT
GLOVE SURG SYN 7.5  E (GLOVE) ×1
GLOVE SURG SYN 7.5 E (GLOVE) ×1 IMPLANT
GLOVE SURG SYN 7.5 PF PI (GLOVE) ×1 IMPLANT
GOWN STRL REIN XL XLG (GOWN DISPOSABLE) ×2 IMPLANT
GOWN STRL REUS W/ TWL XL LVL3 (GOWN DISPOSABLE) ×1 IMPLANT
GOWN STRL REUS W/TWL XL LVL3 (GOWN DISPOSABLE) ×2
NDL HYPO 25X1 1.5 SAFETY (NEEDLE) IMPLANT
NEEDLE HYPO 22GX1.5 SAFETY (NEEDLE) IMPLANT
NEEDLE HYPO 25X1 1.5 SAFETY (NEEDLE) IMPLANT
NS IRRIG 1000ML POUR BTL (IV SOLUTION) ×2 IMPLANT
PACK BASIN DAY SURGERY FS (CUSTOM PROCEDURE TRAY) ×2 IMPLANT
PAD CAST 4YDX4 CTTN HI CHSV (CAST SUPPLIES) ×1 IMPLANT
PADDING CAST ABS 4INX4YD NS (CAST SUPPLIES) ×1
PADDING CAST ABS COTTON 4X4 ST (CAST SUPPLIES) ×1 IMPLANT
PADDING CAST COTTON 4X4 STRL (CAST SUPPLIES) ×2
SHEET MEDIUM DRAPE 40X70 STRL (DRAPES) ×2 IMPLANT
SLEEVE SCD COMPRESS KNEE MED (MISCELLANEOUS) ×2 IMPLANT
SPONGE GAUZE 2X2 8PLY STRL LF (GAUZE/BANDAGES/DRESSINGS) IMPLANT
SPONGE LAP 18X18 RF (DISPOSABLE) ×4 IMPLANT
STOCKINETTE 6  STRL (DRAPES) ×1
STOCKINETTE 6 STRL (DRAPES) ×1 IMPLANT
SUCTION FRAZIER HANDLE 10FR (MISCELLANEOUS)
SUCTION TUBE FRAZIER 10FR DISP (MISCELLANEOUS) IMPLANT
SUT BONE WAX W31G (SUTURE) ×2 IMPLANT
SUT ETHILON 3 0 PS 1 (SUTURE) ×1 IMPLANT
SUT ETHILON 4 0 PS 2 18 (SUTURE) IMPLANT
SUT MNCRL AB 3-0 PS2 18 (SUTURE) IMPLANT
SUT MNCRL AB 4-0 PS2 18 (SUTURE) IMPLANT
SUT VIC AB 3-0 PS1 18 (SUTURE)
SUT VIC AB 3-0 PS1 18XBRD (SUTURE) IMPLANT
SYR BULB 3OZ (MISCELLANEOUS) IMPLANT
SYR CONTROL 10ML LL (SYRINGE) ×1 IMPLANT
TOWEL GREEN STERILE FF (TOWEL DISPOSABLE) ×2 IMPLANT
TUBE CONNECTING 20X1/4 (TUBING) ×2 IMPLANT
UNDERPAD 30X30 (UNDERPADS AND DIAPERS) ×2 IMPLANT
YANKAUER SUCT BULB TIP NO VENT (SUCTIONS) ×2 IMPLANT

## 2018-04-24 NOTE — H&P (Signed)
PREOPERATIVE H&P  Chief Complaint: right 2nd toe osteomyelitis  HPI: Brittany [name] is a 73 y.o. female who presents for surgical treatment of right 2nd toe osteomyelitis.  She denies any changes in medical history.  Past Medical History:  Diagnosis Date  . Anemia    as a younger woman  . Arthritis    "all over my body" (10/01/2017)  . Chronic pain    In pain clinic   . Diabetic peripheral neuropathy ([place])   . GERD (gastroesophageal reflux disease)   . High cholesterol   . History of hiatal hernia   . Hypertension   . Type II diabetes mellitus ([place]) dx'd ~ 2008   Past Surgical History:  Procedure Laterality Date  . AMPUTATION Right 09/29/2017   Procedure: AMPUTATION RIGHT 1ST RAY;  Surgeon: Leandrew Koyanagi, MD;  Location: [facility];  Service: Orthopedics;  Laterality: Right;  . APPLICATION OF WOUND VAC Right 09/29/2017   "foot"  . BACK SURGERY    . COLONOSCOPY    . I&D EXTREMITY Right 08/10/2017   Procedure: IRRIGATION AND DEBRIDEMENT FOOT;  Surgeon: Leandrew Koyanagi, MD;  Location: [facility];  Service: Orthopedics;  Laterality: Right;  . [place]  . POSTERIOR LUMBAR FUSION  1996  . TOE SURGERY Right 1980s   bone removed "inbetween my toes"  . VAGINAL HYSTERECTOMY     Social History   Socioeconomic History  . Marital status: Widowed    Spouse name: Not on file  . Number of children: Not on file  . Years of education: Not on file  . Highest education level: Not on file  Occupational History  . Not on file  Social Needs  . Financial resource strain: Not on file  . Food insecurity:    Worry: Not on file    Inability: Not on file  . Transportation needs:    Medical: Not on file    Non-medical: Not on file  Tobacco Use  . Smoking status: Never Smoker  . Smokeless tobacco: Never Used  Substance and Sexual Activity  . Alcohol use: Never    Frequency: Never  . Drug use: Never  . Sexual activity: Not Currently    Partners: Male  Lifestyle  . Physical  activity:    Days per week: Not on file    Minutes per session: Not on file  . Stress: Not on file  Relationships  . Social connections:    Talks on phone: Not on file    Gets together: Not on file    Attends religious service: Not on file    Active member of club or organization: Not on file    Attends meetings of clubs or organizations: Not on file    Relationship status: Not on file  Other Topics Concern  . Not on file  Social History Narrative  . Not on file   Family History  Problem Relation Age of Onset  . Cancer Mother        breast  . Depression Mother        bipolar  . Breast cancer Mother 35  . Heart disease Father 31       MI  . Diabetes Brother   . Kidney disease Brother   . Hypertension Brother   . Hyperlipidemia Brother   . Stroke Brother   . Coronary artery disease Other   . Diabetes Other    Allergies  Allergen Reactions  . Sulfonamide Derivatives Hives  Prior to Admission medications   Medication Sig Start Date End Date Taking? Authorizing Provider  amLODipine (NORVASC) 5 MG tablet Take 1 tablet (5 mg total) by mouth daily. 10/16/17  Yes Roma Schanz R, DO  aspirin 81 MG tablet Take 81 mg by mouth at bedtime.    Yes [provider]  atorvastatin (LIPITOR) 10 MG tablet Take 1 tablet (10 mg total) by mouth daily. 10/16/17  Yes Ann Held, DO  bisoprolol-hydrochlorothiazide [facility]) 10-6.25 MG tablet TAKE 1 TABLET DAILY 04/14/18  Yes Roma Schanz R, DO  calcium carbonate (TUMS EX) 750 MG chewable tablet Chew 1 tablet by mouth daily as needed for heartburn.    Yes [provider]  Cholecalciferol (VITAMIN D) 1000 UNITS capsule Take 1,000 Units by mouth daily.   Yes [provider]  collagenase (SANTYL) ointment Apply 1 application topically daily. 08/22/17  Yes Leandrew Koyanagi, MD  docusate sodium (COLACE) 100 MG capsule Take 100 mg by mouth 2 (two) times daily.   Yes [provider]  escitalopram  (LEXAPRO) 10 MG tablet Take 1 tablet (10 mg total) by mouth daily. 10/16/17  Yes Ann Held, DO  estradiol (ESTRACE) 1 MG tablet Take 1 tablet (1 mg total) by mouth daily. 10/16/17  Yes Ann Held, DO  Ferrous Sulfate 27 MG TABS Take 27 mg by mouth daily.   Yes [provider]  folic acid (FOLVITE) 527 MCG tablet Take 800 mcg by mouth daily.    Yes [provider]  furosemide (LASIX) 20 MG tablet Take 1 tablet (20 mg total) by mouth daily. 04/17/18  Yes Roma Schanz R, DO  gabapentin (NEURONTIN) 800 MG tablet Take 1 tablet (800 mg total) by mouth 4 (four) times daily. 10/16/17  Yes Lowne Chase, Yvonne R, DO  KLOR-CON M20 20 MEQ tablet Take 1 tablet (20 mEq total) by mouth daily. 10/16/17  Yes Roma Schanz R, DO  l-methylfolate-B6-B12 (METANX) 3-35-2 MG TABS Take 1 tablet by mouth daily.   Yes [provider]  morphine (MS CONTIN) 60 MG 12 hr tablet TK 1 T PO Q 8 H 10/08/17  Yes [provider]  Multiple Vitamins-Minerals (MULTIVITAMIN GUMMIES WOMENS PO) Take 2 tablets by mouth daily.   Yes [provider]  omega-3 acid ethyl esters (LOVAZA) 1 G capsule Take 1 g by mouth daily.   Yes [provider]  omeprazole (PRILOSEC) 40 MG capsule Take 1 capsule (40 mg total) by mouth daily. 10/16/17  Yes Ann Held, DO  oxyCODONE-acetaminophen (PERCOCET) 5-325 MG tablet Take 1-2 tablets by mouth every 4 (four) hours as needed for severe pain. 09/29/17  Yes Leandrew Koyanagi, MD  polyvinyl alcohol (LIQUIFILM TEARS) 1.4 % ophthalmic solution Place 1 drop into both eyes as needed for dry eyes. Patient taking differently: Place 1 drop into both eyes 2 (two) times daily as needed for dry eyes.  08/12/17  Yes Bonnell Public, MD  sitaGLIPtin-metformin (JANUMET) 50-1000 MG tablet Take 2 tablets po at bedtime daily 10/16/17  Yes Ann Held, DO  Specialty Vitamins Products (BIOTIN PLUS KERATIN) 10000-100 MCG-MG TABS Take 1  tablet by mouth daily.   Yes [provider]  tiZANidine (ZANAFLEX) 4 MG tablet Take 4 mg by mouth 3 (three) times daily.  08/26/10  Yes [provider]  vitamin B-12 (CYANOCOBALAMIN) 100 MCG tablet Take 100 mcg by mouth daily.   Yes [provider]  Blood Glucose Monitoring Suppl (ONE TOUCH ULTRA MINI) w/Device KIT Use as directed once a day.  Dx Code:  E11.42 05/01/17   Roma Schanz R, DO  glucose blood (ONE TOUCH ULTRA TEST) test strip Check Blood sugar once daily 02/07/17   Carollee Herter, Alferd Apa, DO  loratadine (CLARITIN) 10 MG tablet Take 10 mg by mouth daily as needed. For allergies    [provider]  [facility] DELICA LANCETS 55K MISC Check blood sugar once daily 02/07/17   Roma Schanz R, DO     Positive ROS: All other systems have been reviewed and were otherwise negative with the exception of those mentioned in the HPI and as above.  Physical Exam: General: Alert, no acute distress Cardiovascular: No pedal edema Respiratory: No cyanosis, no use of accessory musculature GI: abdomen soft Skin: No lesions in the area of chief complaint Neurologic: Sensation intact distally Psychiatric: Patient is competent for consent with normal mood and affect Lymphatic: no lymphedema  MUSCULOSKELETAL: exam stable  Assessment: right 2nd toe osteomyelitis  Plan: Plan for Procedure(s): RIGHT 2ND TOE PROXIMAL INTERPHALANGEAL JOINT DISARTICULATION  The risks benefits and alternatives were discussed with the patient including but not limited to the risks of nonoperative treatment, versus surgical intervention including infection, bleeding, nerve injury,  blood clots, cardiopulmonary complications, morbidity, mortality, among others, and they were willing to proceed.   Eduard Roux, MD   04/24/2018 11:51 AM

## 2018-04-24 NOTE — Op Note (Signed)
   Date of Surgery: 04/24/2018  INDICATIONS: Brittany [name] is a 73 y.o.-year-old female with a right 2nd toe osteomyelitis;  The patient did consent to the procedure after discussion of the risks and benefits.  PREOPERATIVE DIAGNOSIS: Right 2nd toe osteomyelitis of distal and middle phalanx  POSTOPERATIVE DIAGNOSIS: Same.  PROCEDURE:  1. Amputation of right 2nd toe through PIP joint 2. Adjacent tissue rearrangement right 2nd toe 1.5 cm  SURGEON: N. Eduard Roux, M.D.  ASSIST: Ciro Backer [place], [place]; necessary for the timely completion of procedure and due to complexity of procedure.  ANESTHESIA:  MAC and digital block  IV FLUIDS AND URINE: See anesthesia.  ESTIMATED BLOOD LOSS: minimal mL.  IMPLANTS: none  DRAINS: none  COMPLICATIONS: None.  DESCRIPTION OF PROCEDURE: The patient was brought to the operating room and placed supine on the operating table.  The patient had been signed prior to the procedure and this was documented. The patient had the anesthesia placed by the anesthesiologist.  A time-out was performed to confirm that this was the correct patient, site, side and location. The patient did receive antibiotics prior to the incision and was re-dosed during the procedure as needed at indicated intervals. The patient had the operative extremity prepped and draped in the standard surgical fashion.    I placed a digital block for the second toe using a combination of 0.25% bupivacaine and 1% lidocaine.  After adequate anesthesia I then created a fishmouth incision based over the PIP joint.  The pressure ulcer on the tip of the toe was fully excised as a result.  Full-thickness flaps were raised off of the middle phalanx.  The PIP joint was then identified and disarticulated.  The surgical wound was then thoroughly irrigated and hemostasis was obtained.  Adjacent tissue rearrangement was performed in order to primarily approximate the skin with 4-0 nylon sutures.  Sterile dressings  were applied.  Patient tolerated procedure well had no immediate complications.  POSTOPERATIVE PLAN: Patient will be nonweightbearing to the forefoot until the surgical wound is healed.  Brittany Cecil, MD [place] 1:21 PM

## 2018-04-24 NOTE — Transfer of Care (Signed)
Immediate Anesthesia Transfer of Care Note  Patient: Brittany [name]  Procedure(s) Performed: RIGHT 2ND TOE PROXIMAL INTERPHALANGEAL JOINT DISARTICULATION (Right Foot)  Patient Location: PACU  Anesthesia Type:MAC  Level of Consciousness: awake, alert  and oriented  Airway & Oxygen Therapy: Patient Spontanous Breathing  Post-op Assessment: Report given to RN and Post -op Vital signs reviewed and stable  Post vital signs: Reviewed and stable  Last Vitals:  Vitals Value Taken Time  BP 105/52 04/24/2018  1:30 PM  Temp    Pulse 54 04/24/2018  1:32 PM  Resp 12 04/24/2018  1:32 PM  SpO2 99 % 04/24/2018  1:32 PM  Vitals shown include unvalidated device data.  Last Pain:  Vitals:   04/24/18 1135  TempSrc: Oral         Complications: No apparent anesthesia complications

## 2018-04-24 NOTE — Anesthesia Preprocedure Evaluation (Addendum)
Anesthesia Evaluation  Patient identified by MRN, date of birth, ID band Patient awake    Reviewed: Allergy & Precautions, Patient's Chart, lab work & pertinent test results  Airway Mallampati: II  TM Distance: >3 FB     Dental   Pulmonary neg pulmonary ROS,    breath sounds clear to auscultation       Cardiovascular hypertension, Pt. on medications and Pt. on home beta blockers  Rhythm:Regular Rate:Normal     Neuro/Psych Anxiety  Neuromuscular disease    GI/Hepatic Neg liver ROS, hiatal hernia, GERD  Medicated,  Endo/Other  diabetes, Type 2, Oral Hypoglycemic Agents  Renal/GU negative Renal ROS     Musculoskeletal  (+) Arthritis ,   Abdominal   Peds  Hematology  (+) anemia ,   Anesthesia Other Findings   Reproductive/Obstetrics                            Anesthesia Physical Anesthesia Plan  ASA: III  Anesthesia Plan: MAC   Post-op Pain Management:    Induction: Intravenous  PONV Risk Score and Plan: 4 or greater and Ondansetron, Dexamethasone and Treatment may vary due to age or medical condition  Airway Management Planned: Simple Face Mask and Nasal Cannula  Additional Equipment: None  Intra-op Plan:   Post-operative Plan:   Informed Consent: I have reviewed the patients History and Physical, chart, labs and discussed the procedure including the risks, benefits and alternatives for the proposed anesthesia with the patient or authorized representative who has indicated his/her understanding and acceptance.     Dental advisory given  Plan Discussed with: CRNA and Anesthesiologist  Anesthesia Plan Comments:        Anesthesia Quick Evaluation

## 2018-04-24 NOTE — Discharge Instructions (Signed)
Post Anesthesia Home Care Instructions  Activity: Get plenty of rest for the remainder of the day. A responsible individual must stay with you for 24 hours following the procedure.  For the next 24 hours, DO NOT: -Drive a car -Paediatric nurse -Drink alcoholic beverages -Take any medication unless instructed by your physician -Make any legal decisions or sign important papers.  Meals: Start with liquid foods such as gelatin or soup. Progress to regular foods as tolerated. Avoid greasy, spicy, heavy foods. If nausea and/or vomiting occur, drink only clear liquids until the nausea and/or vomiting subsides. Call your physician if vomiting continues.  Special Instructions/Symptoms: Your throat may feel dry or sore from the anesthesia or the breathing tube placed in your throat during surgery. If this causes discomfort, gargle with warm salt water. The discomfort should disappear within 24 hours.  If you had a scopolamine patch placed behind your ear for the management of post- operative nausea and/or vomiting:  1. The medication in the patch is effective for 72 hours, after which it should be removed.  Wrap patch in a tissue and discard in the trash. Wash hands thoroughly with soap and water. 2. You may remove the patch earlier than 72 hours if you experience unpleasant side effects which may include dry mouth, dizziness or visual disturbances. 3. Avoid touching the patch. Wash your hands with soap and water after contact with the patch.      Postoperative instructions:  Weightbearing instructions: no weight bearing on front half of foot  Dressing instructions: Keep your dressing and/or splint clean and dry at all times.  It will be removed at your first post-operative appointment.  Your stitches and/or staples will be removed at this visit.  Incision instructions:  Do not soak your incision for 3 weeks after surgery.  If the incision gets wet, pat dry and do not scrub the  incision.  Pain control:  You have been given a prescription to be taken as directed for post-operative pain control.  In addition, elevate the operative extremity above the heart at all times to prevent swelling and throbbing pain.  Take over-the-counter Colace, 100mg  by mouth twice a day while taking narcotic pain medications to help prevent constipation.  Follow up appointments: 1) 12-14 days for suture removal and wound check. 2) Dr. Erlinda Hong as scheduled.   -------------------------------------------------------------------------------------------------------------  After Surgery Pain Control:  After your surgery, post-surgical discomfort or pain is likely. This discomfort can last several days to a few weeks. At certain times of the day your discomfort may be more intense.  Did you receive a nerve block?  A nerve block can provide pain relief for one hour to two days after your surgery. As long as the nerve block is working, you will experience little or no sensation in the area the surgeon operated on.  As the nerve block wears off, you will begin to experience pain or discomfort. It is very important that you begin taking your prescribed pain medication before the nerve block fully wears off. Treating your pain at the first sign of the block wearing off will ensure your pain is better controlled and more tolerable when full-sensation returns. Do not wait until the pain is intolerable, as the medicine will be less effective. It is better to treat pain in advance than to try and catch up.  General Anesthesia:  If you did not receive a nerve block during your surgery, you will need to start taking your pain medication shortly after your  surgery and should continue to do so as prescribed by your surgeon.  Pain Medication:  Most commonly we prescribe Vicodin and Percocet for post-operative pain. Both of these medications contain a combination of acetaminophen (Tylenol) and a narcotic to help  control pain.   It takes between 30 and 45 minutes before pain medication starts to work. It is important to take your medication before your pain level gets too intense.   Nausea is a common side effect of many pain medications. You will want to eat something before taking your pain medicine to help prevent nausea.   If you are taking a prescription pain medication that contains acetaminophen, we recommend that you do not take additional over the counter acetaminophen (Tylenol).  Other pain relieving options:   Using a cold pack to ice the affected area a few times a day (15 to 20 minutes at a time) can help to relieve pain, reduce swelling and bruising.   Elevation of the affected area can also help to reduce pain and swelling.

## 2018-04-24 NOTE — Anesthesia Postprocedure Evaluation (Signed)
Anesthesia Post Note  Patient: Brittany [name]  Procedure(s) Performed: RIGHT 2ND TOE PROXIMAL INTERPHALANGEAL JOINT DISARTICULATION (Right Foot)     Patient location during evaluation: PACU Anesthesia Type: MAC Level of consciousness: awake and alert Pain management: pain level controlled Vital Signs Assessment: post-procedure vital signs reviewed and stable Respiratory status: spontaneous breathing, nonlabored ventilation, respiratory function stable and patient connected to nasal cannula oxygen Cardiovascular status: stable and blood pressure returned to baseline Postop Assessment: no apparent nausea or vomiting Anesthetic complications: no    Last Vitals:  Vitals:   04/24/18 1400 04/24/18 1420  BP: 119/62 119/82  Pulse: (!) 53 (!) 56  Resp: (!) 21 18  Temp:  36.6 C  SpO2: 100% 100%    Last Pain:  Vitals:   04/24/18 1420  TempSrc:   PainSc: 0-No pain                 Effie Berkshire

## 2018-04-27 ENCOUNTER — Encounter (HOSPITAL_BASED_OUTPATIENT_CLINIC_OR_DEPARTMENT_OTHER): Payer: Self-pay | Admitting: Orthopaedic Surgery

## 2018-05-01 ENCOUNTER — Other Ambulatory Visit: Payer: Self-pay | Admitting: *Deleted

## 2018-05-01 ENCOUNTER — Ambulatory Visit (INDEPENDENT_AMBULATORY_CARE_PROVIDER_SITE_OTHER): Payer: Medicare HMO | Admitting: Physician Assistant

## 2018-05-01 DIAGNOSIS — E1169 Type 2 diabetes mellitus with other specified complication: Secondary | ICD-10-CM

## 2018-05-01 DIAGNOSIS — Z780 Asymptomatic menopausal state: Secondary | ICD-10-CM

## 2018-05-01 DIAGNOSIS — I10 Essential (primary) hypertension: Secondary | ICD-10-CM

## 2018-05-01 DIAGNOSIS — E785 Hyperlipidemia, unspecified: Secondary | ICD-10-CM

## 2018-05-01 DIAGNOSIS — M869 Osteomyelitis, unspecified: Secondary | ICD-10-CM

## 2018-05-01 MED ORDER — MUPIROCIN 2 % EX OINT: TOPICAL_OINTMENT | CUTANEOUS | 0 refills | Status: DC

## 2018-05-01 MED ORDER — BISOPROLOL-HYDROCHLOROTHIAZIDE 10-6.25 MG PO TABS: 1.0000 | ORAL_TABLET | Freq: Every day | ORAL | 1 refills | Status: DC

## 2018-05-01 MED ORDER — ESTRADIOL 1 MG PO TABS: 1.0000 mg | ORAL_TABLET | Freq: Every day | ORAL | 1 refills | Status: DC

## 2018-05-01 MED ORDER — AMLODIPINE BESYLATE 5 MG PO TABS: 5.0000 mg | ORAL_TABLET | Freq: Every day | ORAL | 1 refills | Status: DC

## 2018-05-01 MED ORDER — ATORVASTATIN CALCIUM 10 MG PO TABS: 10.0000 mg | ORAL_TABLET | Freq: Every day | ORAL | 1 refills | Status: DC

## 2018-05-01 NOTE — Progress Notes (Signed)
Post-Op Visit Note   Patient: Brittany [name]           Date of Birth: 08-Dec-1945           MRN: [id number] Visit Date: 05/01/2018 PCP: Ann Held, DO   Assessment & Plan:  Chief Complaint: No chief complaint on file.  Visit Diagnoses:  1. Osteomyelitis of second toe of right foot [facility])     Plan: Patient is a pleasant 73 year old female who presents to our clinic today 7 days status post right second toe disarticulation PIP joint, date of surgery 04/24/2018.  She has been doing well.  No pain.  No fevers or chills.  She has been nonweightbearing to the forefoot.  Wearing her Darco shoe.  Examination of the right second toe reveals a well healing surgical incision with nylon sutures in place.  No signs of cellulitis or infection.  No drainage.  Today, we will apply mupirocin to the wound.  I will call in a prescription as well.  She will do this twice daily.  Follow-up with [place] in 2 weeks time for suture removal.  Continue nonweightbearing to the forefoot in the meantime.  Follow-Up Instructions: Return in about 2 weeks (around 05/15/2018).   Orders:  No orders of the defined types were placed in this encounter.  Meds ordered this encounter  Medications  . mupirocin ointment (BACTROBAN) 2 %    Sig: Apply to affected area 2 times daily    Dispense:  22 g    Refill:  0    Imaging: No new imaging  PMFS History: Patient Active Problem List   Diagnosis Date Noted  . Osteomyelitis of second toe of right foot ([place]) 04/24/2018  . Osteomyelitis of right foot ([place]) 04/21/2018  . 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 Callus 01/13/2018  . Generalized anxiety disorder 10/16/2017  . Menopause 10/16/2017  . DM (diabetes mellitus) type II uncontrolled, periph vascular disorder ([place]) 10/16/2017  . Hyperlipidemia associated with type 2 diabetes mellitus ([place]) 10/16/2017  . 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 Osteomyelitis of great toe of right foot ([place]) 09/25/2017  . Pain in right foot 09/16/2017  . Cellulitis 08/08/2017  . Hypoglycemia without diagnosis of diabetes mellitus 08/08/2017  . Peripheral neuropathy 08/08/2017  . Abscess 08/08/2017  . Hyperlipidemia LDL goal <70 09/22/2016  . Tooth pain 09/03/2012  . Edema 09/03/2012  . Supraclavicular fossa fullness 06/18/2012  . Hair loss 09/18/2010  . Fatigue 09/18/2010  . DYSPEPSIA&OTHER [facility] DISORDERS FUNCTION STOMACH 05/08/2010  . DIARRHEA 05/08/2010  . DIZZINESS 04/19/2010  . OTHER ACUTE SINUSITIS 02/28/2010  . NAUSEA 02/28/2010  . Pain in right ankle and joints of right foot 02/15/2010  . URI 07/09/2007  . GERD 02/12/2007  . LOW BACK PAIN 02/12/2007  . Hypertension 09/03/2006  . HOT FLASHES 09/03/2006  . INSOMNIA 09/03/2006   Past Medical History:  Diagnosis Date  . Anemia    as a younger woman  . Arthritis    "all over my body" (10/01/2017)  . Chronic pain    In pain clinic   . Diabetic peripheral neuropathy ([place])   . GERD (gastroesophageal reflux disease)   . High cholesterol   . History of hiatal hernia   . Hypertension   . Type II diabetes mellitus ([place]) dx'd ~ 2008    Family History  Problem Relation Age of Onset  . Cancer Mother  breast  . Depression Mother        bipolar  . Breast cancer Mother 80  . Heart disease Father 15       MI  . Diabetes Brother   . Kidney disease Brother   . Hypertension Brother   . Hyperlipidemia Brother   . Stroke Brother   . Coronary artery disease Other   . Diabetes Other     Past Surgical History:  Procedure Laterality Date  . AMPUTATION Right 09/29/2017   Procedure: AMPUTATION RIGHT 1ST RAY;  Surgeon: Leandrew Koyanagi, MD;  Location: [place];  Service: Orthopedics;  Laterality: Right;  . AMPUTATION TOE Right 04/24/2018   Procedure: RIGHT 2ND TOE PROXIMAL INTERPHALANGEAL JOINT DISARTICULATION;  Surgeon: Leandrew Koyanagi, MD;  Location: [place];   Service: Orthopedics;  Laterality: Right;  . APPLICATION OF WOUND VAC Right 09/29/2017   "foot"  . BACK SURGERY    . COLONOSCOPY    . I&D EXTREMITY Right 08/10/2017   Procedure: IRRIGATION AND DEBRIDEMENT FOOT;  Surgeon: Leandrew Koyanagi, MD;  Location: [place];  Service: Orthopedics;  Laterality: Right;  . [place]  . POSTERIOR LUMBAR FUSION  1996  . TOE SURGERY Right 1980s   bone removed "inbetween my toes"  . VAGINAL HYSTERECTOMY     Social History   Occupational History  . Not on file  Tobacco Use  . Smoking status: Never Smoker  . Smokeless tobacco: Never Used  Substance and Sexual Activity  . Alcohol use: Never    Frequency: Never  . Drug use: Never  . Sexual activity: Not Currently    Partners: Male

## 2018-05-05 NOTE — Telephone Encounter (Signed)
Pt states that [facility] advised her today they never did receive her medications. I advised her they were sent on 1/24. She would like someone to call these in [phone]

## 2018-05-07 DIAGNOSIS — G894 Chronic pain syndrome: Secondary | ICD-10-CM | POA: Diagnosis not present

## 2018-05-07 DIAGNOSIS — Z79891 Long term (current) use of opiate analgesic: Secondary | ICD-10-CM | POA: Diagnosis not present

## 2018-05-07 DIAGNOSIS — E1142 Type 2 diabetes mellitus with diabetic polyneuropathy: Secondary | ICD-10-CM | POA: Diagnosis not present

## 2018-05-07 DIAGNOSIS — G4700 Insomnia, unspecified: Secondary | ICD-10-CM | POA: Diagnosis not present

## 2018-05-07 MED ORDER — ATORVASTATIN CALCIUM 10 MG PO TABS: 10.0000 mg | ORAL_TABLET | Freq: Every day | ORAL | 1 refills | Status: DC

## 2018-05-07 MED ORDER — AMLODIPINE BESYLATE 5 MG PO TABS: 5.0000 mg | ORAL_TABLET | Freq: Every day | ORAL | 1 refills | Status: DC

## 2018-05-07 MED ORDER — ESTRADIOL 1 MG PO TABS: 1.0000 mg | ORAL_TABLET | Freq: Every day | ORAL | 1 refills | Status: DC

## 2018-05-07 MED ORDER — BISOPROLOL-HYDROCHLOROTHIAZIDE 10-6.25 MG PO TABS: 1.0000 | ORAL_TABLET | Freq: Every day | ORAL | 1 refills | Status: DC

## 2018-05-07 NOTE — Addendum Note (Signed)
Addended by: Magdalene Molly A on: 05/07/2018 10:47 AM   Modules accepted: Orders

## 2018-05-07 NOTE — Telephone Encounter (Signed)
Resent medications in  

## 2018-05-13 ENCOUNTER — Ambulatory Visit (INDEPENDENT_AMBULATORY_CARE_PROVIDER_SITE_OTHER): Payer: Medicare HMO | Admitting: Orthopaedic Surgery

## 2018-05-13 ENCOUNTER — Encounter (INDEPENDENT_AMBULATORY_CARE_PROVIDER_SITE_OTHER): Payer: Self-pay | Admitting: Orthopaedic Surgery

## 2018-05-13 DIAGNOSIS — Z89421 Acquired absence of other right toe(s): Secondary | ICD-10-CM

## 2018-05-13 DIAGNOSIS — M869 Osteomyelitis, unspecified: Secondary | ICD-10-CM

## 2018-05-13 DIAGNOSIS — Z4781 Encounter for orthopedic aftercare following surgical amputation: Secondary | ICD-10-CM

## 2018-05-13 NOTE — Progress Notes (Signed)
Post-Op Visit Note   Patient: Brittany [name]           Date of Birth: 08-22-1945           MRN: [id number] Visit Date: 05/13/2018 PCP: Ann Held, DO   Assessment & Plan:  Chief Complaint:  Chief Complaint  Patient presents with  . Right 2nd Toe - Routine Post Op, Follow-up   Visit Diagnoses:  1. Osteomyelitis of second toe of right foot ([place])     Plan: [name] comes back today for a wound check status post partial right second toe amputation.  She is doing well.  She reports no pain.  Surgical incision is healed without any signs of infection.  We remove the sutures today.  Bactroban ointment daily with a Band-Aid.  She may shower at this point.  Recheck the wound in about 4 weeks.  Questions encouraged and answered.  Follow-Up Instructions: Return in about 4 weeks (around 06/10/2018).   Orders:  No orders of the defined types were placed in this encounter.  No orders of the defined types were placed in this encounter.   Imaging: No results found.  PMFS History: Patient Active Problem List   Diagnosis Date Noted  . Osteomyelitis of second toe of right foot ([place]) 04/24/2018  . Osteomyelitis of right foot ([place]) 04/21/2018  . 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 ([place]) 04/17/2018  . Callus 01/13/2018  . Generalized anxiety disorder 10/16/2017  . Menopause 10/16/2017  . DM (diabetes mellitus) type II uncontrolled, periph vascular disorder ([place]) 10/16/2017  . Hyperlipidemia associated with type 2 diabetes mellitus ([place]) 10/16/2017  . 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 Osteomyelitis of great toe of right foot ([place]) 09/25/2017  . Pain in right foot 09/16/2017  . Cellulitis 08/08/2017  . Hypoglycemia without diagnosis of diabetes mellitus 08/08/2017  . Peripheral neuropathy 08/08/2017  . Abscess 08/08/2017  . Hyperlipidemia LDL goal <70 09/22/2016  . Tooth pain 09/03/2012  . Edema 09/03/2012  .  Supraclavicular fossa fullness 06/18/2012  . Hair loss 09/18/2010  . Fatigue 09/18/2010  . DYSPEPSIA&OTHER [facility] DISORDERS FUNCTION STOMACH 05/08/2010  . DIARRHEA 05/08/2010  . DIZZINESS 04/19/2010  . OTHER ACUTE SINUSITIS 02/28/2010  . NAUSEA 02/28/2010  . Pain in right ankle and joints of right foot 02/15/2010  . URI 07/09/2007  . GERD 02/12/2007  . LOW BACK PAIN 02/12/2007  . Hypertension 09/03/2006  . HOT FLASHES 09/03/2006  . INSOMNIA 09/03/2006   Past Medical History:  Diagnosis Date  . Anemia    as a younger woman  . Arthritis    "all over my body" (10/01/2017)  . Chronic pain    In pain clinic   . Diabetic peripheral neuropathy ([place])   . GERD (gastroesophageal reflux disease)   . High cholesterol   . History of hiatal hernia   . Hypertension   . Type II diabetes mellitus ([place]) dx'd ~ 2008    Family History  Problem Relation Age of Onset  . Cancer Mother        breast  . Depression Mother        bipolar  . Breast cancer Mother 39  . Heart disease Father 1       MI  . Diabetes Brother   . Kidney disease Brother   . Hypertension Brother   . Hyperlipidemia Brother   . Stroke Brother   . Coronary artery disease Other   .  Diabetes Other     Past Surgical History:  Procedure Laterality Date  . AMPUTATION Right 09/29/2017   Procedure: AMPUTATION RIGHT 1ST RAY;  Surgeon: Leandrew Koyanagi, MD;  Location: [place];  Service: Orthopedics;  Laterality: Right;  . AMPUTATION TOE Right 04/24/2018   Procedure: RIGHT 2ND TOE PROXIMAL INTERPHALANGEAL JOINT DISARTICULATION;  Surgeon: Leandrew Koyanagi, MD;  Location: [place];  Service: Orthopedics;  Laterality: Right;  . APPLICATION OF WOUND VAC Right 09/29/2017   "foot"  . BACK SURGERY    . COLONOSCOPY    . I&D EXTREMITY Right 08/10/2017   Procedure: IRRIGATION AND DEBRIDEMENT FOOT;  Surgeon: Leandrew Koyanagi, MD;  Location: [place];  Service: Orthopedics;  Laterality: Right;  . [place]  . POSTERIOR  LUMBAR FUSION  1996  . TOE SURGERY Right 1980s   bone removed "inbetween my toes"  . VAGINAL HYSTERECTOMY     Social History   Occupational History  . Not on file  Tobacco Use  . Smoking status: Never Smoker  . Smokeless tobacco: Never Used  Substance and Sexual Activity  . Alcohol use: Never    Frequency: Never  . Drug use: Never  . Sexual activity: Not Currently    Partners: Male

## 2018-05-14 ENCOUNTER — Ambulatory Visit: Payer: Medicare HMO

## 2018-06-07 ENCOUNTER — Other Ambulatory Visit: Payer: Self-pay | Admitting: Family Medicine

## 2018-06-07 DIAGNOSIS — F411 Generalized anxiety disorder: Secondary | ICD-10-CM

## 2018-06-10 ENCOUNTER — Ambulatory Visit (INDEPENDENT_AMBULATORY_CARE_PROVIDER_SITE_OTHER): Payer: Medicare HMO

## 2018-06-10 ENCOUNTER — Encounter (INDEPENDENT_AMBULATORY_CARE_PROVIDER_SITE_OTHER): Payer: Self-pay | Admitting: Orthopaedic Surgery

## 2018-06-10 ENCOUNTER — Ambulatory Visit (INDEPENDENT_AMBULATORY_CARE_PROVIDER_SITE_OTHER): Payer: Medicare HMO | Admitting: Orthopaedic Surgery

## 2018-06-10 DIAGNOSIS — M869 Osteomyelitis, unspecified: Secondary | ICD-10-CM

## 2018-06-10 DIAGNOSIS — M1612 Unilateral primary osteoarthritis, left hip: Secondary | ICD-10-CM

## 2018-06-10 DIAGNOSIS — Z89421 Acquired absence of other right toe(s): Secondary | ICD-10-CM | POA: Diagnosis not present

## 2018-06-10 DIAGNOSIS — M25552 Pain in left hip: Secondary | ICD-10-CM

## 2018-06-10 NOTE — Progress Notes (Signed)
Office Visit Note   Patient: Brittany Brittany [name]           Date of Birth: [birth year]           MRN: [id number] Visit Date: 06/10/2018              Requested by: [address], [place], [place] [address] [place],  [address] PCP: Brittany Brittany [name], Brittany Apa, DO   Assessment & Plan: Visit Diagnoses:  1. Primary osteoarthritis of left hip   2. Osteomyelitis of second toe of right foot (Brittany [name])     Plan: Impression is status post second toe amputation right foot PIP joint and end-stage left hip osteoarthritis.  I believe the pain she is getting to the left thigh and knee is actually coming from her hip joint.  She has failed over-the-counter anti-inflammatories and has politely declined cortisone injection.  She would like to proceed with definitive treatment of a left anterior total hip replacement.  Risks, benefits and possible occasions reviewed.  Rehab recovery time discussed.  All questions were answered.  We will need medical clearance prior to surgical intervention.  In regards to the right foot, second toe, she will continue with Bactroban.  In regards to the great toe, we will provide her with a prescription for a orthotic.  She will follow-up with [place] in 6 weeks time for recheck.  Follow-Up Instructions: Return in about 6 weeks (around 07/22/2018).   Orders:  Orders Placed This Encounter  Procedures  . XR Pelvis 1-2 Views   No orders of the defined types were placed in this encounter.     Procedures: No procedures performed   Clinical Data: No additional findings.   Subjective: Chief Complaint  Patient presents with  . Right Foot - Pain    HPI patient is a pleasant 73 year old female who presents our clinic today 47 days status post right second toe PIP joint disarticulation, date of surgery 04/24/2018.  She has been doing fairly well.  She has been ambulating in her Darco shoe.  She has recently noticed a very small bloody scab to the tip of the second toe.  She  has not noticed any drainage.  No other issue she brings up today is left leg pain.  This began following a fall she had back in December.  She was seen in our office where x-rays were obtained.  Negative for fracture or other acute findings.  She has had pain to the top of the thigh and the knee since.  Worse going from a seated to standing position as well as with hip flexion as well as putting on her shoes.  She has tried over-the-counter anti-inflammatories without relief of symptoms.  No groin pain.  No pain down the back of her leg.  She does note to remote back surgeries.  Review of Systems as detailed in HPI.  All others reviewed and are negative.   Objective: Vital Signs: There were no vitals taken for this visit.  Physical Exam well-developed and well-nourished female in no acute distress.  Alert and oriented x3 per  Ortho Exam examination of her second toe right foot reveals a very small 3 mm area of dried blood to the very distal aspect.  No drainage.  Slight erythema.  Examination of her left knee shows no effusion.  Range of motion 0 to 95 degrees.  She does have pain with flexion.  She has pain with hip flexion.  Positive logroll.  Decreased sensation left lower extremity.  Specialty Comments:  No specialty comments available.  Imaging: Xr Pelvis 1-2 Views  Result Date: 06/10/2018 Marked joint space narrowing left hip    PMFS History: Patient Active Problem List   Diagnosis Date Noted  . Pain in left hip 06/10/2018  . Osteomyelitis of second toe of right foot (Brittany Brittany [name]) 04/24/2018  . Osteomyelitis of right foot (Brittany Brittany [name]) 04/21/2018  . 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 (Brittany [name]) 04/17/2018  . Callus 01/13/2018  . Generalized anxiety disorder 10/16/2017  . Menopause 10/16/2017  . DM (diabetes mellitus) type II uncontrolled, periph vascular disorder (Brittany Brittany [name]) 10/16/2017  . Hyperlipidemia associated with type 2 diabetes mellitus (Brittany Brittany [name]) 10/16/2017  . 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 (Brittany Brittany [name]) 10/14/2017  . Osteomyelitis of great toe of right foot (Brittany Brittany [name]) 09/25/2017  . Pain in right foot 09/16/2017  . Cellulitis 08/08/2017  . Hypoglycemia without diagnosis of diabetes mellitus 08/08/2017  . Peripheral neuropathy 08/08/2017  . Abscess 08/08/2017  . Hyperlipidemia LDL goal <70 09/22/2016  . Tooth pain 09/03/2012  . Edema 09/03/2012  . Supraclavicular fossa fullness 06/18/2012  . Hair loss 09/18/2010  . Fatigue 09/18/2010  . DYSPEPSIA&OTHER [name] 05/08/2010  . DIARRHEA 05/08/2010  . DIZZINESS 04/19/2010  . OTHER ACUTE SINUSITIS 02/28/2010  . NAUSEA 02/28/2010  . Pain in right ankle and joints of right foot 02/15/2010  . URI 07/09/2007  . GERD 02/12/2007  . LOW BACK PAIN 02/12/2007  . Hypertension 09/03/2006  . HOT FLASHES 09/03/2006  . INSOMNIA 09/03/2006   Past Medical History:  Diagnosis Date  . Anemia    as a younger woman  . Arthritis    "all over my body" (10/01/2017)  . Chronic pain    In pain clinic   . Diabetic peripheral neuropathy ([name])   . GERD (gastroesophageal reflux disease)   . High cholesterol   . History of hiatal hernia   . Hypertension   . Type II diabetes mellitus ([name]) dx'd ~ 2008    Family History  Problem Relation Age of Onset  . Cancer Mother        breast  . Depression Mother        bipolar  . Breast cancer Mother 50  . Heart disease Father 40       MI  . Diabetes Brother   . Kidney disease Brother   . Hypertension Brother   . Hyperlipidemia Brother   . Stroke Brother   . Coronary artery disease Other   . Diabetes Other     Past Surgical History:  Procedure Laterality Date  . AMPUTATION Right 09/29/2017   Procedure: AMPUTATION RIGHT 1ST RAY;  Surgeon: Leandrew Koyanagi, MD;  Location: [place];  Service: Orthopedics;  Laterality: Right;  . AMPUTATION TOE Right 04/24/2018   Procedure: RIGHT 2ND TOE PROXIMAL INTERPHALANGEAL JOINT DISARTICULATION;  Surgeon: Leandrew Koyanagi, MD;  Location: [place];  Service: Orthopedics;  Laterality: Right;  . APPLICATION OF WOUND VAC Right 09/29/2017   "foot"  . BACK SURGERY    . COLONOSCOPY    . I&D EXTREMITY Right 08/10/2017   Procedure: IRRIGATION AND DEBRIDEMENT FOOT;  Surgeon: Leandrew Koyanagi, MD;  Location: [place];  Service: Orthopedics;  Laterality: Right;  . [place]  . POSTERIOR LUMBAR FUSION  1996  . TOE SURGERY Right 1980s   bone removed "inbetween my toes"  . VAGINAL HYSTERECTOMY  Social History   Occupational History  . Not on file  Tobacco Use  . Smoking status: Never Smoker  . Smokeless tobacco: Never Used  Substance and Sexual Activity  . Alcohol use: Never    Frequency: Never  . Drug use: Never  . Sexual activity: Not Currently    Partners: Male

## 2018-06-15 ENCOUNTER — Ambulatory Visit: Payer: Self-pay | Admitting: Family Medicine

## 2018-06-15 NOTE — Telephone Encounter (Signed)
Pt called in stating that her "Bladder is prolapsed".   "It's been that way for years".   "I've just been so busy with family matters and taking care of my husband and my brother and deaths in my family".   "I'm ready to have it fixed now".   "Dr. Carollee Herter is aware of it".  So I stopped the triage process and made her an appt with Dr. Carollee Herter for Friday 06/19/2018 at 11:00 per her request for a Friday appt.    Reason for Disposition . [1] Something is hanging out of the vagina AND [2] can't easily be pushed back inside  Answer Assessment - Initial Assessment Questions 1. SYMPTOM: "What's the main symptom you're concerned about?" (e.g., pain, itching, dryness)     My bladder has come out.   It has been out for a while.   I've had deaths in my family and been so busy.    Dr. Carollee Herter knows all about this. 2. LOCATION: "Where is the  I located?" (e.g., inside/outside, left/right)     It's the bladder. 3. ONSET: "When did the  *No Answer*  start?"     *No Answer* 4. PAIN: "Is there any pain?" If so, ask: "How bad is it?" (Scale: 1-10; mild, moderate, severe)     *No Answer* 5. ITCHING: "Is there any itching?" If so, ask: "How bad is it?" (Scale: 1-10; mild, moderate, severe)     *No Answer* 6. CAUSE: "What do you think is causing the discharge?" "Have you had the same problem before? What happened then?"     *No Answer* 7. OTHER SYMPTOMS: "Do you have any other symptoms?" (e.g., fever, itching, vaginal bleeding, pain with urination, injury to genital area, vaginal foreign body)     *No Answer* 8. PREGNANCY: "Is there any chance you are pregnant?" "When was your last menstrual period?"     *No Answer*  Protocols used: VAGINAL [facility]

## 2018-06-19 ENCOUNTER — Ambulatory Visit: Payer: Medicare HMO | Admitting: Family Medicine

## 2018-06-23 ENCOUNTER — Other Ambulatory Visit: Payer: Self-pay | Admitting: Family Medicine

## 2018-06-23 ENCOUNTER — Telehealth: Payer: Self-pay

## 2018-06-23 DIAGNOSIS — N8110 Cystocele, unspecified: Secondary | ICD-10-CM

## 2018-06-23 NOTE — Telephone Encounter (Signed)
Referral in

## 2018-06-23 NOTE — Telephone Encounter (Signed)
Copied from [place] [id number]. Topic: Referral - Request for Referral >> Jun 19, 2018  8:54 AM Sheran Luz wrote: Patient returning call to [facility]. Cancelled OV and she states she would like to have referral for vaginal prolapse. She states she does not care which office, she just prefers a female provider.

## 2018-07-02 DIAGNOSIS — Z79891 Long term (current) use of opiate analgesic: Secondary | ICD-10-CM | POA: Diagnosis not present

## 2018-07-02 DIAGNOSIS — G894 Chronic pain syndrome: Secondary | ICD-10-CM | POA: Diagnosis not present

## 2018-07-02 DIAGNOSIS — G4700 Insomnia, unspecified: Secondary | ICD-10-CM | POA: Diagnosis not present

## 2018-07-02 DIAGNOSIS — E1142 Type 2 diabetes mellitus with diabetic polyneuropathy: Secondary | ICD-10-CM | POA: Diagnosis not present

## 2018-07-02 NOTE — Telephone Encounter (Signed)
Pt calling back to check status. Please advise  °

## 2018-07-22 ENCOUNTER — Ambulatory Visit (INDEPENDENT_AMBULATORY_CARE_PROVIDER_SITE_OTHER): Payer: Medicare HMO | Admitting: Orthopaedic Surgery

## 2018-07-23 ENCOUNTER — Ambulatory Visit (INDEPENDENT_AMBULATORY_CARE_PROVIDER_SITE_OTHER): Payer: Medicare HMO | Admitting: Orthopaedic Surgery

## 2018-07-24 ENCOUNTER — Ambulatory Visit (INDEPENDENT_AMBULATORY_CARE_PROVIDER_SITE_OTHER): Payer: Medicare HMO | Admitting: Orthopaedic Surgery

## 2018-07-30 ENCOUNTER — Other Ambulatory Visit: Payer: Self-pay | Admitting: Family Medicine

## 2018-07-30 DIAGNOSIS — K219 Gastro-esophageal reflux disease without esophagitis: Secondary | ICD-10-CM

## 2018-08-07 ENCOUNTER — Ambulatory Visit (INDEPENDENT_AMBULATORY_CARE_PROVIDER_SITE_OTHER): Payer: Medicare HMO | Admitting: Orthopaedic Surgery

## 2018-08-11 ENCOUNTER — Other Ambulatory Visit: Payer: Self-pay | Admitting: *Deleted

## 2018-08-11 DIAGNOSIS — E1110 Type 2 diabetes mellitus with ketoacidosis without coma: Secondary | ICD-10-CM

## 2018-08-11 MED ORDER — SITAGLIPTIN PHOS-METFORMIN HCL 50-1000 MG PO TABS: ORAL_TABLET | ORAL | 1 refills | Status: DC

## 2018-08-12 ENCOUNTER — Encounter: Payer: Self-pay | Admitting: Orthopaedic Surgery

## 2018-08-12 ENCOUNTER — Other Ambulatory Visit: Payer: Self-pay

## 2018-08-12 ENCOUNTER — Ambulatory Visit (INDEPENDENT_AMBULATORY_CARE_PROVIDER_SITE_OTHER): Payer: Medicare HMO | Admitting: Orthopaedic Surgery

## 2018-08-12 DIAGNOSIS — M869 Osteomyelitis, unspecified: Secondary | ICD-10-CM

## 2018-08-12 DIAGNOSIS — M25552 Pain in left hip: Secondary | ICD-10-CM

## 2018-08-12 DIAGNOSIS — M1612 Unilateral primary osteoarthritis, left hip: Secondary | ICD-10-CM

## 2018-08-12 MED ORDER — METHYLPREDNISOLONE ACETATE 40 MG/ML IJ SUSP: 40.0000 mg | Freq: Once | INTRAMUSCULAR | Status: DC

## 2018-08-12 NOTE — Progress Notes (Signed)
Subjective: Patient is here for ultrasound-guided intra-articular left hip injection.  Objective: Tender over the greater trochanter, very limited passive range of motion of her hip with pain on any movement.  Procedure: Ultrasound-guided left hip injection: After sterile prep with Betadine, injected 8 cc 1% lidocaine without epinephrine and 40 mg methylprednisolone using a 22-gauge spinal needle, passing the needle through the iliofemoral ligament into the femoral head/neck junction.  Injectate was seen filling the joint capsule.  Clear yellow synovial fluid was aspirated prior to injection, further confirming intra-articular placement.  She had very good relief during the immediate anesthetic phase.  Follow-up as directed.

## 2018-08-12 NOTE — Addendum Note (Signed)
Addended by: Hortencia Pilar on: 08/12/2018 02:31 PM   Modules accepted: Orders

## 2018-08-12 NOTE — Progress Notes (Signed)
Office Visit Note   Patient: Brittany [name]           Date of Birth: Nov 07, 1945           MRN: [id number] Visit Date: 08/12/2018              Requested by: [address], [place], [place] [address] [place], [place] [address] PCP: Carollee Herter, Alferd Apa, DO   Assessment & Plan: Visit Diagnoses:  1. Osteomyelitis of second toe of right foot ([place])   2. Primary osteoarthritis of left hip     Plan: Overall Brittany [name] is doing well from my standpoint.  Until she can have her hip replacement I did offer a cortisone injection in her left hip which she agreed to today.  She will get her prolapsed bladder corrected first before undergoing the hip replacement.  She will give [place] a call when she is ready for that.  Otherwise she is doing well.  Her diabetes is well controlled.  Follow-Up Instructions: Return if symptoms worsen or fail to improve.   Orders:  No orders of the defined types were placed in this encounter.  No orders of the defined types were placed in this encounter.     Procedures: No procedures performed   Clinical Data: No additional findings.   Subjective: Chief Complaint  Patient presents with  . Right Foot - Pain, Follow-up  . Left Hip - Pain    Brittany [name] follows up today for checkup of her right second toe partial amputation and follow-up of her left hip degenerative joint disease.  She has decided she would like to undergo left total hip replacement but she would first like to have her prolapsed bladder corrected.  The toe is doing well.  She is walking with a walking stick due to the hip pain.   Review of Systems  Constitutional: Negative.   HENT: Negative.   Eyes: Negative.   Respiratory: Negative.   Cardiovascular: Negative.   Endocrine: Negative.   Musculoskeletal: Negative.   Neurological: Negative.   Hematological: Negative.   Psychiatric/Behavioral: Negative.   All other systems reviewed and are negative.    Objective: Vital Signs:  There were no vitals taken for this visit.  Physical Exam Vitals signs and nursing note reviewed.  Constitutional:      Appearance: She is well-developed.  Pulmonary:     Effort: Pulmonary effort is normal.  Skin:    General: Skin is warm.     Capillary Refill: Capillary refill takes less than 2 seconds.  Neurological:     Mental Status: She is alert and oriented to person, place, and time.  Psychiatric:        Behavior: Behavior normal.        Thought Content: Thought content normal.        Judgment: Judgment normal.     Ortho Exam Right second toe examination fully healed surgical scar and stable partial toe amputation.  No complications. Left hip exam is stable.  She walks with an antalgic gait. Specialty Comments:  No specialty comments available.  Imaging: No results found.   PMFS History: Patient Active Problem List   Diagnosis Date Noted  . Pain in left hip 06/10/2018  . Osteomyelitis of second toe of right foot ([place]) 04/24/2018  . Osteomyelitis of right foot ([place]) 04/21/2018  . 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 Callus 01/13/2018  . Generalized anxiety disorder 10/16/2017  .  Menopause 10/16/2017  . DM (diabetes mellitus) type II uncontrolled, periph vascular disorder ([place]) 10/16/2017  . Hyperlipidemia associated with type 2 diabetes mellitus ([place]) 10/16/2017  . 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 Osteomyelitis of great toe of right foot ([place]) 09/25/2017  . Pain in right foot 09/16/2017  . Cellulitis 08/08/2017  . Hypoglycemia without diagnosis of diabetes mellitus 08/08/2017  . Peripheral neuropathy 08/08/2017  . Abscess 08/08/2017  . Hyperlipidemia LDL goal <70 09/22/2016  . Tooth pain 09/03/2012  . Edema 09/03/2012  . Supraclavicular fossa fullness 06/18/2012  . Hair loss 09/18/2010  . Fatigue 09/18/2010  . DYSPEPSIA&OTHER [facility] DISORDERS FUNCTION STOMACH 05/08/2010  .  DIARRHEA 05/08/2010  . DIZZINESS 04/19/2010  . OTHER ACUTE SINUSITIS 02/28/2010  . NAUSEA 02/28/2010  . Pain in right ankle and joints of right foot 02/15/2010  . URI 07/09/2007  . GERD 02/12/2007  . LOW BACK PAIN 02/12/2007  . Hypertension 09/03/2006  . HOT FLASHES 09/03/2006  . INSOMNIA 09/03/2006   Past Medical History:  Diagnosis Date  . Anemia    as a younger woman  . Arthritis    "all over my body" (10/01/2017)  . Chronic pain    In pain clinic   . Diabetic peripheral neuropathy ([place])   . GERD (gastroesophageal reflux disease)   . High cholesterol   . History of hiatal hernia   . Hypertension   . Type II diabetes mellitus ([place]) dx'd ~ 2008    Family History  Problem Relation Age of Onset  . Cancer Mother        breast  . Depression Mother        bipolar  . Breast cancer Mother 35  . Heart disease Father 50       MI  . Diabetes Brother   . [name] disease Brother   . Hypertension Brother   . Hyperlipidemia Brother   . Stroke Brother   . Coronary artery disease Other   . Diabetes Other     Past Surgical History:  Procedure Laterality Date  . AMPUTATION Right 09/29/2017   Procedure: AMPUTATION RIGHT 1ST RAY;  Surgeon: Leandrew Koyanagi, MD;  Location: [place];  Service: Orthopedics;  Laterality: Right;  . AMPUTATION TOE Right 04/24/2018   Procedure: RIGHT 2ND TOE PROXIMAL INTERPHALANGEAL JOINT DISARTICULATION;  Surgeon: Leandrew Koyanagi, MD;  Location: [place];  Service: Orthopedics;  Laterality: Right;  . APPLICATION OF WOUND VAC Right 09/29/2017   "foot"  . BACK SURGERY    . COLONOSCOPY    . I&D EXTREMITY Right 08/10/2017   Procedure: IRRIGATION AND DEBRIDEMENT FOOT;  Surgeon: Leandrew Koyanagi, MD;  Location: [place];  Service: Orthopedics;  Laterality: Right;  . [place]  . POSTERIOR LUMBAR FUSION  1996  . TOE SURGERY Right 1980s   bone removed "inbetween my toes"  . VAGINAL HYSTERECTOMY     Social History   Occupational History  .  Not on file  Tobacco Use  . Smoking status: Never Smoker  . Smokeless tobacco: Never Used  Substance and Sexual Activity  . Alcohol use: Never    Frequency: Never  . Drug use: Never  . Sexual activity: Not Currently    Partners: Male

## 2018-09-01 DIAGNOSIS — G4700 Insomnia, unspecified: Secondary | ICD-10-CM | POA: Diagnosis not present

## 2018-09-01 DIAGNOSIS — Z79891 Long term (current) use of opiate analgesic: Secondary | ICD-10-CM | POA: Diagnosis not present

## 2018-09-01 DIAGNOSIS — G894 Chronic pain syndrome: Secondary | ICD-10-CM | POA: Diagnosis not present

## 2018-09-01 DIAGNOSIS — E1142 Type 2 diabetes mellitus with diabetic polyneuropathy: Secondary | ICD-10-CM | POA: Diagnosis not present

## 2018-09-02 ENCOUNTER — Telehealth: Payer: Self-pay | Admitting: Orthopaedic Surgery

## 2018-09-02 NOTE — Telephone Encounter (Signed)
See message.

## 2018-09-02 NOTE — Telephone Encounter (Signed)
Patient wanted to let Dr Erlinda Hong know she is ready to schedule surgery.

## 2018-09-02 NOTE — Telephone Encounter (Signed)
We can schedule her.  It will have to be at least 6 weeks from 08/12/18 which is when she got her cortisone injection.  Please let her know that.  Thanks.

## 2018-09-23 NOTE — Telephone Encounter (Signed)
Spoke with patient, she is going 09/30/18 to get bladder/vaginal prolapse addressed for possible surgery.  Once she has had that procedure and is cleared for THA, she will call back to reschedule.

## 2018-09-30 ENCOUNTER — Ambulatory Visit: Payer: Medicare HMO | Admitting: Family Medicine

## 2018-09-30 ENCOUNTER — Encounter: Payer: Self-pay | Admitting: Family Medicine

## 2018-09-30 ENCOUNTER — Other Ambulatory Visit: Payer: Self-pay

## 2018-09-30 VITALS — BP 104/43 | HR 59 | Ht 69.5 in | Wt 170.1 lb

## 2018-09-30 DIAGNOSIS — N8110 Cystocele, unspecified: Secondary | ICD-10-CM | POA: Diagnosis not present

## 2018-09-30 NOTE — Assessment & Plan Note (Signed)
Longstanding and she is not a great surgical candidate. Will proceed with ordering pessary. Return for placement in 2 wks.

## 2018-09-30 NOTE — Progress Notes (Signed)
   Subjective:    Patient ID: Brittany [name] is a 74 y.o. female presenting with Vaginal Prolapse  on 09/30/2018  HPI: New patient today w/ vaginal prolapse. Complicated PMH.Had vaginal hysterectomy for abnormal bleeding about 45 years ago. They took one ovary out then. On Estrogen  Since age 37, 25 years ago. Notes vaginal prolapse x 10 years. Stays out most of the time now. Has had an injury with the shower head. Prefers to not have surgery.  Review of Systems  Constitutional: Negative for chills and fever.  Respiratory: Negative for shortness of breath.   Cardiovascular: Negative for chest pain.  Gastrointestinal: Negative for abdominal pain, nausea and vomiting.  Genitourinary: Negative for dysuria.  Skin: Negative for rash.      Objective:    BP (!) 104/43   Pulse (!) 59   Ht 5' 9.5" (1.765 m)   Wt 170 lb 1.3 oz (77.1 kg)   BMI 24.76 kg/m  Physical Exam Constitutional:      General: She is not in acute distress.    Appearance: She is well-developed.  HENT:     Head: Normocephalic and atraumatic.  Eyes:     General: No scleral icterus. Neck:     Musculoskeletal: Neck supple.  Cardiovascular:     Rate and Rhythm: Normal rate.  Pulmonary:     Effort: Pulmonary effort is normal.  Abdominal:     Palpations: Abdomen is soft.  Genitourinary:    Comments: 8 x 5 cm fleshy tissue protruding from vagina. 1.5 x 2.5 cm healing area noted. Skin:    General: Skin is warm and dry.  Neurological:     Mental Status: She is alert and oriented to person, place, and time.   Procedure: Vaginal mucosa replaced in vaginal vault. #4, #5, and #6 pessary (ring with support) used to fit appropriate pessary. Patient tolerated well, stood, ambulated, sat without discomfort.      Assessment & Plan:   Problem List Items Addressed This Visit      Unprioritized   Vaginal prolapse - Primary (Chronic)    Longstanding and she is not a great surgical candidate. Will proceed with ordering  pessary. Return for placement in 2 wks.         Total face-to-face time with patient: 20 minutes. Over 50% of encounter was spent on counseling and coordination of care. Return in about 2 weeks (around 10/14/2018) for pessary placement.  [name] 09/30/2018 1:27 PM

## 2018-10-05 ENCOUNTER — Telehealth: Payer: Self-pay

## 2018-10-05 DIAGNOSIS — N8110 Cystocele, unspecified: Secondary | ICD-10-CM

## 2018-10-05 NOTE — Telephone Encounter (Signed)
Patient made aware that referral has been placed with Dr. [place] Pink since the type of surgery is not done within our [facility]. [name] RN

## 2018-10-05 NOTE — Addendum Note (Signed)
Addended by: Phill Myron on: 10/05/2018 01:40 PM   Modules accepted: Orders

## 2018-10-05 NOTE — Telephone Encounter (Signed)
Patient called and was seen for pessary last week. Patient states she has now decided she would like to proceed with surgery instead.   Informed patient I will notify the provider and return a call to her with plan.  [name] RN

## 2018-10-16 ENCOUNTER — Ambulatory Visit: Payer: Medicare HMO | Admitting: Obstetrics & Gynecology

## 2018-10-19 ENCOUNTER — Encounter: Payer: Medicare HMO | Admitting: Family Medicine

## 2018-10-27 ENCOUNTER — Other Ambulatory Visit: Payer: Self-pay | Admitting: Family Medicine

## 2018-10-27 DIAGNOSIS — I10 Essential (primary) hypertension: Secondary | ICD-10-CM

## 2018-11-03 ENCOUNTER — Other Ambulatory Visit: Payer: Self-pay

## 2018-11-11 NOTE — Progress Notes (Addendum)
[place], [place] [address] Phone: [phone] Fax: [place], [place] [address] [place] [address] Phone: [phone] Fax: [phone]  [facility] [place] [place] to [address] [place] [address] Phone: [phone] Fax: [phone]      Your procedure is scheduled on Monday, November 16, 2018.  Report to [facility] Main Entrance "A" at [place].M., and check in at the Admitting office.  Call this number if you have problems the morning of surgery:  [phone]  Call [phone] if you have any questions prior to your surgery date Monday-Friday 8am-4pm    Remember:  Do not eat after midnight the night before your surgery  You may drink clear liquids until 0715 the morning of your surgery.   Clear liquids allowed are: Water, Non-Citrus Juices (without pulp), Carbonated Beverages, Clear Tea, Black Coffee Only (No creamer or milk), and Gatorade  Please complete your PRE-SURGERY G2 that was provided to you by 07:15 the morning of surgery.  Please, if able, drink it in one sitting. DO NOT SIP.     Take these medicines the morning of surgery with A SIP OF WATER: Amlodipine (Norvasc) Atorvastatin (Lipitor) Escitalopram (Lexapro) Gabapentin (Neurontin) Morphine (MS Contin) Omeprazole (Prilosec) Tizanidine (Zanaflex)   7 days prior to surgery STOP taking any Aspirin (unless otherwise instructed by your surgeon), Aleve, Naproxen, Ibuprofen, Motrin, Advil, Goody's, BC's, all herbal medications, fish oil, and all vitamins.  Follow your surgeon's instructions on when to stop Aspirin.  If no instructions were given by your surgeon then you will need to call the office to get those instructions.     WHAT DO I DO ABOUT MY DIABETES  MEDICATION?  [name] Do not take oral diabetes medicines (pills) the morning of surgery. - DO NOT take your Sitagliptin-metformin (Janumet)  . If your CBG is greater than 220 mg/dL, you may take  of your sliding scale (correction) dose of insulin.   How to Manage Your Diabetes Before and After Surgery  Why is it important to control my blood sugar before and after surgery? . Improving blood sugar levels before and after surgery helps healing and can limit problems. . A way of improving blood sugar control is eating a healthy diet by: o  Eating less sugar and carbohydrates o  Increasing activity/exercise o  Talking with your doctor about reaching your blood sugar goals . High blood sugars (greater than 180 mg/dL) can raise your risk of infections and slow your recovery, so you will need to focus on controlling your diabetes during the weeks before surgery. . Make sure that the doctor who takes care of your diabetes knows about your planned surgery including the date and location.  How do I manage my blood sugar before surgery? . Check your blood sugar at least 4 times a day, starting 2 days before surgery, to make sure that the level is not too high or low. o Check your blood sugar the morning of your surgery when you wake up and every 2 hours until you get to the Short Stay unit. . If your blood sugar is less than 70 mg/dL, you will need to treat for low blood sugar: o Do not take insulin. o Treat a low blood sugar (less than 70  mg/dL) with  cup of clear juice (cranberry or apple), 4 glucose tablets, OR glucose gel. o Recheck blood sugar in 15 minutes after treatment (to make sure it is greater than 70 mg/dL). If your blood sugar is not greater than 70 mg/dL on recheck, call [phone] for further instructions. . Report your blood sugar to the short stay nurse when you get to Short Stay.  . If you are admitted to the hospital after surgery: o Your blood sugar will be checked by the staff  and you will probably be given insulin after surgery (instead of oral diabetes medicines) to make sure you have good blood sugar levels. o The goal for blood sugar control after surgery is 80-180 mg/dL.   The Morning of Surgery  Do not wear jewelry, make-up or nail polish.  Do not wear lotions, powders, or perfumes/colognes, or deodorant  Do not shave 48 hours prior to surgery.  Do not bring valuables to the hospital.  [facility] is not responsible for any belongings or valuables.  IF you are a smoker, DO NOT Smoke 24 hours prior to surgery  IF you wear a CPAP at night please bring your mask, tubing, and machine the morning of surgery   Remember that you must have someone to transport you home after your surgery, and remain with you for 24 hours if you are discharged the same day.   Contacts, eyeglasses, hearing aids, dentures or bridgework may not be worn into surgery.    Leave your suitcase in the car.  After surgery it may be brought to your room.  For patients admitted to the hospital, discharge time will be determined by your treatment team.  Patients discharged the day of surgery will not be allowed to drive home.    Special instructions:   [place]- Preparing For Surgery  Before surgery, you can play an important role. Because skin is not sterile, your skin needs to be as free of germs as possible. You can reduce the number of germs on your skin by washing with CHG (chlorahexidine gluconate) Soap before surgery.  CHG is an antiseptic cleaner which kills germs and bonds with the skin to continue killing germs even after washing.    Oral Hygiene is also important to reduce your risk of infection.  Remember - BRUSH YOUR TEETH THE MORNING OF SURGERY WITH YOUR REGULAR TOOTHPASTE  Please do not use if you have an allergy to CHG or antibacterial soaps. If your skin becomes reddened/irritated stop using the CHG.  Do not shave (including legs and underarms) for at least 48 hours  prior to first CHG shower. It is OK to shave your face.  Please follow these instructions carefully.   1. Shower the NIGHT BEFORE SURGERY and the MORNING OF SURGERY with CHG Soap.   2. If you chose to wash your hair, wash your hair first as usual with your normal shampoo.  3. After you shampoo, rinse your hair and body thoroughly to remove the shampoo.  4. Use CHG as you would any other liquid soap. You can apply CHG directly to the skin and wash gently with a scrungie or a clean washcloth.   5. Apply the CHG Soap to your body ONLY FROM THE NECK DOWN.  Do not use on open wounds or open sores. Avoid contact with your eyes, ears, mouth and genitals (private parts). Wash Face and genitals (private parts)  with your normal soap.   6. Wash thoroughly, paying special attention to  the area where your surgery will be performed.  7. Thoroughly rinse your body with warm water from the neck down.  8. DO NOT shower/wash with your normal soap after using and rinsing off the CHG Soap.  9. Pat yourself dry with a CLEAN TOWEL.  10. Wear CLEAN PAJAMAS to bed the night before surgery, wear comfortable clothes the morning of surgery  11. Place CLEAN SHEETS on your bed the night of your first shower and DO NOT SLEEP WITH PETS.    Day of Surgery:  Please shower the morning of surgery with the CHG soap  Do not apply any deodorants/lotions. Please wear clean clothes to the hospital/surgery center.   Remember to brush your teeth WITH YOUR REGULAR TOOTHPASTE.   Please read over the following fact sheets that you were given.

## 2018-11-12 ENCOUNTER — Other Ambulatory Visit: Payer: Self-pay

## 2018-11-12 ENCOUNTER — Encounter (HOSPITAL_COMMUNITY)
Admission: RE | Admit: 2018-11-12 | Discharge: 2018-11-12 | Disposition: A | Discharge status: Home / Self Care | Payer: Medicare HMO | Source: Ambulatory Visit | Attending: Physician Assistant | Admitting: Physician Assistant

## 2018-11-12 ENCOUNTER — Encounter (HOSPITAL_COMMUNITY): Payer: Self-pay

## 2018-11-12 ENCOUNTER — Other Ambulatory Visit (HOSPITAL_COMMUNITY)
Admission: RE | Admit: 2018-11-12 | Discharge: 2018-11-12 | Disposition: A | Discharge status: Home / Self Care | Payer: Medicare HMO | Source: Ambulatory Visit | Attending: Orthopaedic Surgery | Admitting: Orthopaedic Surgery

## 2018-11-12 ENCOUNTER — Encounter (HOSPITAL_COMMUNITY)
Admission: RE | Admit: 2018-11-12 | Discharge: 2018-11-12 | Disposition: A | Discharge status: Home / Self Care | Payer: Medicare HMO | Source: Ambulatory Visit | Attending: Orthopaedic Surgery | Admitting: Orthopaedic Surgery

## 2018-11-12 DIAGNOSIS — M1612 Unilateral primary osteoarthritis, left hip: Secondary | ICD-10-CM | POA: Diagnosis not present

## 2018-11-12 DIAGNOSIS — Z01818 Encounter for other preprocedural examination: Secondary | ICD-10-CM | POA: Diagnosis not present

## 2018-11-12 DIAGNOSIS — E119 Type 2 diabetes mellitus without complications: Secondary | ICD-10-CM | POA: Insufficient documentation

## 2018-11-12 DIAGNOSIS — I10 Essential (primary) hypertension: Secondary | ICD-10-CM | POA: Diagnosis not present

## 2018-11-12 DIAGNOSIS — Z20828 Contact with and (suspected) exposure to other viral communicable diseases: Secondary | ICD-10-CM | POA: Diagnosis not present

## 2018-11-12 DIAGNOSIS — K219 Gastro-esophageal reflux disease without esophagitis: Secondary | ICD-10-CM | POA: Insufficient documentation

## 2018-11-12 LAB — COMPREHENSIVE METABOLIC PANEL
ALT: 17 U/L (ref 0–44)
AST: 20 U/L (ref 15–41)
Albumin: 3.6 g/dL (ref 3.5–5.0)
Alkaline Phosphatase: 55 U/L (ref 38–126)
Anion gap: 10 (ref 5–15)
BUN: 16 mg/dL (ref 8–23)
CO2: 26 mmol/L (ref 22–32)
Calcium: 9.2 mg/dL (ref 8.9–10.3)
Chloride: 104 mmol/L (ref 98–111)
Creatinine, Ser: 0.66 mg/dL (ref 0.44–1.00)
GFR calc Af Amer: >60 mL/min (ref >60)
GFR calc non Af Amer: >60 mL/min (ref >60)
Glucose, Bld: 92 mg/dL (ref 70–99)
Potassium: 4 mmol/L (ref 3.5–5.1)
Sodium: 140 mmol/L (ref 135–145)
Total Bilirubin: 0.3 mg/dL (ref 0.3–1.2)
Total Protein: 6.7 g/dL (ref 6.5–8.1)

## 2018-11-12 LAB — CBC WITH DIFFERENTIAL/PLATELET
Abs Immature Granulocytes: 0.01 10*3/uL (ref 0.00–0.07)
Basophils Absolute: 0 10*3/uL (ref 0.0–0.1)
Basophils Relative: 0 %
Eosinophils Absolute: 0.1 10*3/uL (ref 0.0–0.5)
Eosinophils Relative: 1 %
HCT: 41.1 % (ref 36.0–46.0)
Hemoglobin: 13 g/dL (ref 12.0–15.0)
Immature Granulocytes: 0 %
Lymphocytes Relative: 19 %
Lymphs Abs: 1.6 10*3/uL (ref 0.7–4.0)
MCH: 28 pg (ref 26.0–34.0)
MCHC: 31.6 g/dL (ref 30.0–36.0)
MCV: 88.6 fL (ref 80.0–100.0)
Monocytes Absolute: 0.8 10*3/uL (ref 0.1–1.0)
Monocytes Relative: 10 %
Neutro Abs: 5.9 10*3/uL (ref 1.7–7.7)
Neutrophils Relative %: 70 %
Platelets: 222 10*3/uL (ref 150–400)
RBC: 4.64 MIL/uL (ref 3.87–5.11)
RDW: 12.8 % (ref 11.5–15.5)
WBC: 8.3 10*3/uL (ref 4.0–10.5)
nRBC: 0 % (ref 0.0–0.2)

## 2018-11-12 LAB — PROTIME-INR
INR: 1 (ref 0.8–1.2)
Prothrombin Time: 13.5 seconds (ref 11.4–15.2)

## 2018-11-12 LAB — GLUCOSE, CAPILLARY: Glucose-Capillary: 107 mg/dL — ABNORMAL HIGH (ref 70–99)

## 2018-11-12 LAB — ABO/RH: ABO/RH(D): O NEG

## 2018-11-12 LAB — TYPE AND SCREEN
ABO/RH(D): O NEG
Antibody Screen: NEGATIVE

## 2018-11-12 LAB — SURGICAL PCR SCREEN
MRSA, PCR: NEGATIVE
Staphylococcus aureus: NEGATIVE

## 2018-11-12 LAB — SARS CORONAVIRUS 2 (TAT 6-24 HRS): SARS Coronavirus 2: NEGATIVE

## 2018-11-12 LAB — APTT: aPTT: 30 seconds (ref 24–36)

## 2018-11-12 LAB — HEMOGLOBIN A1C
Hgb A1c MFr Bld: 6.3 % — ABNORMAL HIGH (ref 4.8–5.6)
Mean Plasma Glucose: 134.11 mg/dL

## 2018-11-12 NOTE — Progress Notes (Addendum)
PCP -  Roma Schanz, DO Cardiologist - pt denies  Chest x-ray - 11/12/2018 at PAT appt EKG - 11/12/2018 at PAT appt  Stress Test - pt denies ECHO - pt denies  Cardiac Cath - pt denies  Sleep Study - pt denies CPAP - pt denies  Fasting Blood Sugar - does not remember Checks Blood Sugar 1 times a day- if she remembers, she has not lately  Blood Thinner Instructions: Aspirin Instructions: Aspirin 81 mg- begin holding 11/13/2018  Anesthesia review: No  Patient denies shortness of breath, fever, cough and chest pain at PAT appointment  Patient verbalized understanding of instructions that were given to them at the PAT appointment. Patient was also instructed that they will need to review over the PAT instructions again at home before surgery   Coronavirus Screening  Have you experienced the following symptoms:  Cough yes/no: No Fever (>100.63F)  yes/no: No Runny nose yes/no: No Sore throat yes/no: No Difficulty breathing/shortness of breath  yes/no: No  Have you or a family member traveled in the last 14 days and where? yes/no: No   If the patient indicates "YES" to the above questions, their PAT will be rescheduled to limit the exposure to others and, the surgeon will be notified. THE PATIENT WILL NEED TO BE ASYMPTOMATIC FOR 14 DAYS.   If the patient is not experiencing any of these symptoms, the PAT nurse will instruct them to NOT bring anyone with them to their appointment since they may have these symptoms or traveled as well.   Please remind your patients and families that hospital visitation restrictions are in effect and the importance of the restrictions.

## 2018-11-13 ENCOUNTER — Telehealth: Payer: Self-pay

## 2018-11-13 MED ORDER — CEFAZOLIN SODIUM-DEXTROSE 2-4 GM/100ML-% IV SOLN
2.0000 g | INTRAVENOUS | Status: AC
  Administered 2018-11-16: 2 g via INTRAVENOUS
  Filled 2018-11-13: qty 100

## 2018-11-13 MED ORDER — TRANEXAMIC ACID 1000 MG/10ML IV SOLN
2000.0000 mg | INTRAVENOUS | Status: AC
  Administered 2018-11-16: 2000 mg via TOPICAL
  Filled 2018-11-13: qty 20

## 2018-11-13 MED ORDER — TRANEXAMIC ACID-NACL 1000-0.7 MG/100ML-% IV SOLN
1000.0000 mg | INTRAVENOUS | Status: AC
  Administered 2018-11-16: 1000 mg via INTRAVENOUS
  Filled 2018-11-13: qty 100

## 2018-11-13 NOTE — Telephone Encounter (Signed)
Copied from [place] [phone]. Topic: General - Other >> Nov 13, 2018 12:19 PM Leward Quan A wrote: Reason for CRM: Patient called to inquire from Dr Etter Sjogren that she will be having hip surgery on 11/16/2018 and need to know how long she need to be off her furosemide (LASIX) 20 MG tablet asking for a call back at ph# [phone]

## 2018-11-13 NOTE — Telephone Encounter (Signed)
She should be able to take it right up to day before surgery but surgeon usually lets pt know what to take when

## 2018-11-16 ENCOUNTER — Encounter (HOSPITAL_COMMUNITY): Payer: Self-pay | Admitting: Orthopedic Surgery

## 2018-11-16 ENCOUNTER — Other Ambulatory Visit: Payer: Self-pay

## 2018-11-16 ENCOUNTER — Observation Stay (HOSPITAL_COMMUNITY)
Admission: RE | Admit: 2018-11-16 | Discharge: 2018-11-19 | Disposition: A | Discharge status: Home / Self Care | Payer: Medicare HMO | Attending: Orthopaedic Surgery | Admitting: Orthopaedic Surgery

## 2018-11-16 ENCOUNTER — Encounter (HOSPITAL_COMMUNITY)
Admission: RE | Disposition: A | Discharge status: Home / Self Care | Payer: Self-pay | Source: Home / Self Care | Attending: Orthopaedic Surgery

## 2018-11-16 ENCOUNTER — Ambulatory Visit (HOSPITAL_COMMUNITY): Payer: Medicare HMO

## 2018-11-16 ENCOUNTER — Ambulatory Visit (HOSPITAL_COMMUNITY): Payer: Medicare HMO | Admitting: Certified Registered"

## 2018-11-16 ENCOUNTER — Observation Stay (HOSPITAL_COMMUNITY): Payer: Medicare HMO

## 2018-11-16 DIAGNOSIS — Z89421 Acquired absence of other right toe(s): Secondary | ICD-10-CM | POA: Diagnosis not present

## 2018-11-16 DIAGNOSIS — K449 Diaphragmatic hernia without obstruction or gangrene: Secondary | ICD-10-CM | POA: Diagnosis not present

## 2018-11-16 DIAGNOSIS — G709 Myoneural disorder, unspecified: Secondary | ICD-10-CM | POA: Insufficient documentation

## 2018-11-16 DIAGNOSIS — Z818 Family history of other mental and behavioral disorders: Secondary | ICD-10-CM | POA: Insufficient documentation

## 2018-11-16 DIAGNOSIS — E7800 Pure hypercholesterolemia, unspecified: Secondary | ICD-10-CM | POA: Diagnosis not present

## 2018-11-16 DIAGNOSIS — Z7984 Long term (current) use of oral hypoglycemic drugs: Secondary | ICD-10-CM | POA: Diagnosis not present

## 2018-11-16 DIAGNOSIS — F419 Anxiety disorder, unspecified: Secondary | ICD-10-CM | POA: Diagnosis not present

## 2018-11-16 DIAGNOSIS — Z7982 Long term (current) use of aspirin: Secondary | ICD-10-CM | POA: Diagnosis not present

## 2018-11-16 DIAGNOSIS — K219 Gastro-esophageal reflux disease without esophagitis: Secondary | ICD-10-CM | POA: Insufficient documentation

## 2018-11-16 DIAGNOSIS — Z803 Family history of malignant neoplasm of breast: Secondary | ICD-10-CM | POA: Diagnosis not present

## 2018-11-16 DIAGNOSIS — I10 Essential (primary) hypertension: Secondary | ICD-10-CM | POA: Insufficient documentation

## 2018-11-16 DIAGNOSIS — Z90710 Acquired absence of both cervix and uterus: Secondary | ICD-10-CM | POA: Diagnosis not present

## 2018-11-16 DIAGNOSIS — Z89411 Acquired absence of right great toe: Secondary | ICD-10-CM | POA: Diagnosis not present

## 2018-11-16 DIAGNOSIS — G8929 Other chronic pain: Secondary | ICD-10-CM | POA: Insufficient documentation

## 2018-11-16 DIAGNOSIS — M1990 Unspecified osteoarthritis, unspecified site: Secondary | ICD-10-CM | POA: Diagnosis not present

## 2018-11-16 DIAGNOSIS — Z96649 Presence of unspecified artificial hip joint: Secondary | ICD-10-CM

## 2018-11-16 DIAGNOSIS — M1612 Unilateral primary osteoarthritis, left hip: Secondary | ICD-10-CM | POA: Diagnosis not present

## 2018-11-16 DIAGNOSIS — Z8249 Family history of ischemic heart disease and other diseases of the circulatory system: Secondary | ICD-10-CM | POA: Insufficient documentation

## 2018-11-16 DIAGNOSIS — Z96642 Presence of left artificial hip joint: Secondary | ICD-10-CM

## 2018-11-16 DIAGNOSIS — Z981 Arthrodesis status: Secondary | ICD-10-CM | POA: Diagnosis not present

## 2018-11-16 DIAGNOSIS — D649 Anemia, unspecified: Secondary | ICD-10-CM | POA: Diagnosis not present

## 2018-11-16 DIAGNOSIS — E1142 Type 2 diabetes mellitus with diabetic polyneuropathy: Secondary | ICD-10-CM | POA: Diagnosis not present

## 2018-11-16 DIAGNOSIS — Z823 Family history of stroke: Secondary | ICD-10-CM | POA: Diagnosis not present

## 2018-11-16 DIAGNOSIS — M160 Bilateral primary osteoarthritis of hip: Secondary | ICD-10-CM | POA: Diagnosis present

## 2018-11-16 DIAGNOSIS — Z419 Encounter for procedure for purposes other than remedying health state, unspecified: Secondary | ICD-10-CM

## 2018-11-16 DIAGNOSIS — Z882 Allergy status to sulfonamides status: Secondary | ICD-10-CM | POA: Insufficient documentation

## 2018-11-16 DIAGNOSIS — Z79899 Other long term (current) drug therapy: Secondary | ICD-10-CM | POA: Insufficient documentation

## 2018-11-16 DIAGNOSIS — Z833 Family history of diabetes mellitus: Secondary | ICD-10-CM | POA: Insufficient documentation

## 2018-11-16 DIAGNOSIS — Z471 Aftercare following joint replacement surgery: Secondary | ICD-10-CM | POA: Diagnosis not present

## 2018-11-16 HISTORY — PX: TOTAL HIP ARTHROPLASTY: SHX124

## 2018-11-16 LAB — GLUCOSE, CAPILLARY
Glucose-Capillary: 102 mg/dL — ABNORMAL HIGH (ref 70–99)
Glucose-Capillary: 94 mg/dL (ref 70–99)
Glucose-Capillary: 149 mg/dL — ABNORMAL HIGH (ref 70–99)
Glucose-Capillary: 110 mg/dL — ABNORMAL HIGH (ref 70–99)

## 2018-11-16 SURGERY — ARTHROPLASTY, HIP, TOTAL, ANTERIOR APPROACH
Anesthesia: Spinal | Laterality: Left

## 2018-11-16 MED ORDER — ATORVASTATIN CALCIUM 10 MG PO TABS
10.0000 mg | ORAL_TABLET | Freq: Every day | ORAL | Status: DC
  Administered 2018-11-17 – 2018-11-19 (×3): 10 mg via ORAL
  Filled 2018-11-16 (×3): qty 1

## 2018-11-16 MED ORDER — ASPIRIN EC 81 MG PO TBEC: 81.0000 mg | DELAYED_RELEASE_TABLET | Freq: Two times a day (BID) | ORAL | 0 refills | Status: DC

## 2018-11-16 MED ORDER — OXYCODONE HCL 5 MG PO TABS
ORAL_TABLET | ORAL | Status: AC
  Filled 2018-11-16: qty 1

## 2018-11-16 MED ORDER — LINAGLIPTIN 5 MG PO TABS
5.0000 mg | ORAL_TABLET | Freq: Every day | ORAL | Status: DC
  Administered 2018-11-16 – 2018-11-19 (×4): 5 mg via ORAL
  Filled 2018-11-16 (×4): qty 1

## 2018-11-16 MED ORDER — POVIDONE-IODINE 10 % EX SWAB: 2.0000 "application " | Freq: Once | CUTANEOUS | Status: DC

## 2018-11-16 MED ORDER — VANCOMYCIN HCL 1 G IV SOLR
INTRAVENOUS | Status: DC | PRN
  Administered 2018-11-16: 1000 mg via TOPICAL

## 2018-11-16 MED ORDER — HYDROMORPHONE HCL 1 MG/ML IJ SOLN
0.2500 mg | INTRAMUSCULAR | Status: DC | PRN
  Administered 2018-11-16: 0.5 mg via INTRAVENOUS

## 2018-11-16 MED ORDER — METFORMIN HCL 500 MG PO TABS
2000.0000 mg | ORAL_TABLET | Freq: Every day | ORAL | Status: DC
  Administered 2018-11-16 – 2018-11-19 (×4): 2000 mg via ORAL
  Filled 2018-11-16 (×4): qty 4

## 2018-11-16 MED ORDER — OXYCODONE HCL 5 MG PO TABS
5.0000 mg | ORAL_TABLET | Freq: Once | ORAL | Status: AC | PRN
  Administered 2018-11-16: 5 mg via ORAL

## 2018-11-16 MED ORDER — POLYETHYLENE GLYCOL 3350 17 G PO PACK: 17.0000 g | PACK | Freq: Every day | ORAL | Status: DC | PRN

## 2018-11-16 MED ORDER — PHENOL 1.4 % MT LIQD: 1.0000 | OROMUCOSAL | Status: DC | PRN

## 2018-11-16 MED ORDER — GABAPENTIN 400 MG PO CAPS
800.0000 mg | ORAL_CAPSULE | Freq: Four times a day (QID) | ORAL | Status: DC
  Administered 2018-11-16 – 2018-11-19 (×13): 800 mg via ORAL
  Filled 2018-11-16 (×13): qty 2

## 2018-11-16 MED ORDER — OXYCODONE HCL 5 MG PO TABS: 5.0000 mg | ORAL_TABLET | ORAL | Status: DC | PRN

## 2018-11-16 MED ORDER — METOCLOPRAMIDE HCL 5 MG/ML IJ SOLN: 5.0000 mg | Freq: Three times a day (TID) | INTRAMUSCULAR | Status: DC | PRN

## 2018-11-16 MED ORDER — DEXAMETHASONE SODIUM PHOSPHATE 10 MG/ML IJ SOLN
10.0000 mg | Freq: Once | INTRAMUSCULAR | Status: AC
  Administered 2018-11-17: 10 mg via INTRAVENOUS
  Filled 2018-11-16: qty 1

## 2018-11-16 MED ORDER — ONDANSETRON HCL 4 MG PO TABS: 4.0000 mg | ORAL_TABLET | Freq: Four times a day (QID) | ORAL | Status: DC | PRN

## 2018-11-16 MED ORDER — CHLORHEXIDINE GLUCONATE 4 % EX LIQD: 60.0000 mL | Freq: Once | CUTANEOUS | Status: DC

## 2018-11-16 MED ORDER — FENTANYL CITRATE (PF) 250 MCG/5ML IJ SOLN
INTRAMUSCULAR | Status: AC
  Filled 2018-11-16: qty 5

## 2018-11-16 MED ORDER — MIDAZOLAM HCL 2 MG/2ML IJ SOLN
INTRAMUSCULAR | Status: AC
  Filled 2018-11-16: qty 2

## 2018-11-16 MED ORDER — BISOPROLOL-HYDROCHLOROTHIAZIDE 10-6.25 MG PO TABS
1.0000 | ORAL_TABLET | Freq: Every day | ORAL | Status: DC
  Administered 2018-11-17 – 2018-11-19 (×3): 1 via ORAL
  Filled 2018-11-16 (×3): qty 1

## 2018-11-16 MED ORDER — DOXEPIN HCL 10 MG PO CAPS
10.0000 mg | ORAL_CAPSULE | Freq: Every evening | ORAL | Status: DC | PRN
  Filled 2018-11-16: qty 1

## 2018-11-16 MED ORDER — INSULIN ASPART 100 UNIT/ML ~~LOC~~ SOLN
0.0000 [IU] | Freq: Three times a day (TID) | SUBCUTANEOUS | Status: DC
  Administered 2018-11-16 – 2018-11-17 (×2): 2 [IU] via SUBCUTANEOUS
  Administered 2018-11-17: 3 [IU] via SUBCUTANEOUS
  Administered 2018-11-18: 5 [IU] via SUBCUTANEOUS
  Administered 2018-11-18 – 2018-11-19 (×2): 2 [IU] via SUBCUTANEOUS

## 2018-11-16 MED ORDER — ASPIRIN 81 MG PO CHEW
81.0000 mg | CHEWABLE_TABLET | Freq: Two times a day (BID) | ORAL | Status: DC
  Administered 2018-11-16 – 2018-11-19 (×6): 81 mg via ORAL
  Filled 2018-11-16 (×6): qty 1

## 2018-11-16 MED ORDER — METHOCARBAMOL 500 MG PO TABS
750.0000 mg | ORAL_TABLET | Freq: Four times a day (QID) | ORAL | Status: DC | PRN
  Administered 2018-11-16: 750 mg via ORAL
  Filled 2018-11-16: qty 2

## 2018-11-16 MED ORDER — SODIUM CHLORIDE 0.9 % IR SOLN
Status: DC | PRN
  Administered 2018-11-16: 3000 mL

## 2018-11-16 MED ORDER — MAGNESIUM CITRATE PO SOLN: 1.0000 | Freq: Once | ORAL | Status: DC | PRN

## 2018-11-16 MED ORDER — AMLODIPINE BESYLATE 5 MG PO TABS
5.0000 mg | ORAL_TABLET | Freq: Every day | ORAL | Status: DC
  Administered 2018-11-18 – 2018-11-19 (×2): 5 mg via ORAL
  Filled 2018-11-16 (×2): qty 1

## 2018-11-16 MED ORDER — MIDAZOLAM HCL 2 MG/2ML IJ SOLN
INTRAMUSCULAR | Status: DC | PRN
  Administered 2018-11-16 (×2): 1 mg via INTRAVENOUS

## 2018-11-16 MED ORDER — ESTRADIOL 1 MG PO TABS
1.0000 mg | ORAL_TABLET | Freq: Every day | ORAL | Status: DC
  Administered 2018-11-17 – 2018-11-19 (×3): 1 mg via ORAL
  Filled 2018-11-16 (×3): qty 1

## 2018-11-16 MED ORDER — MENTHOL 3 MG MT LOZG: 1.0000 | LOZENGE | OROMUCOSAL | Status: DC | PRN

## 2018-11-16 MED ORDER — METHOCARBAMOL 750 MG PO TABS: 750.0000 mg | ORAL_TABLET | Freq: Two times a day (BID) | ORAL | 0 refills | Status: DC | PRN

## 2018-11-16 MED ORDER — SITAGLIPTIN PHOS-METFORMIN HCL 50-1000 MG PO TABS: 2.0000 | ORAL_TABLET | Freq: Every day | ORAL | Status: DC

## 2018-11-16 MED ORDER — MORPHINE SULFATE ER 15 MG PO TBCR
60.0000 mg | EXTENDED_RELEASE_TABLET | Freq: Two times a day (BID) | ORAL | Status: DC
  Administered 2018-11-16 – 2018-11-19 (×6): 60 mg via ORAL
  Filled 2018-11-16 (×6): qty 4

## 2018-11-16 MED ORDER — PROPOFOL 500 MG/50ML IV EMUL
INTRAVENOUS | Status: DC | PRN
  Administered 2018-11-16: 100 ug/kg/min via INTRAVENOUS

## 2018-11-16 MED ORDER — HYDROMORPHONE HCL 1 MG/ML IJ SOLN: 0.5000 mg | INTRAMUSCULAR | Status: DC | PRN

## 2018-11-16 MED ORDER — OXYCODONE HCL 5 MG/5ML PO SOLN: 5.0000 mg | Freq: Once | ORAL | Status: AC | PRN

## 2018-11-16 MED ORDER — LACTATED RINGERS IV SOLN
INTRAVENOUS | Status: DC
  Administered 2018-11-16 (×2): via INTRAVENOUS

## 2018-11-16 MED ORDER — DOCUSATE SODIUM 100 MG PO CAPS
100.0000 mg | ORAL_CAPSULE | Freq: Two times a day (BID) | ORAL | Status: DC
  Administered 2018-11-16 – 2018-11-19 (×6): 100 mg via ORAL
  Filled 2018-11-16 (×6): qty 1

## 2018-11-16 MED ORDER — OXYCODONE HCL ER 10 MG PO T12A: 10.0000 mg | EXTENDED_RELEASE_TABLET | Freq: Two times a day (BID) | ORAL | 0 refills | Status: AC

## 2018-11-16 MED ORDER — ONDANSETRON HCL 4 MG PO TABS: 4.0000 mg | ORAL_TABLET | Freq: Three times a day (TID) | ORAL | 0 refills | Status: DC | PRN

## 2018-11-16 MED ORDER — FUROSEMIDE 20 MG PO TABS
20.0000 mg | ORAL_TABLET | Freq: Every day | ORAL | Status: DC
  Administered 2018-11-17 – 2018-11-19 (×3): 20 mg via ORAL
  Filled 2018-11-16 (×3): qty 1

## 2018-11-16 MED ORDER — ACETAMINOPHEN 325 MG PO TABS: 325.0000 mg | ORAL_TABLET | Freq: Four times a day (QID) | ORAL | Status: DC | PRN

## 2018-11-16 MED ORDER — OXYCODONE HCL 5 MG PO TABS: 5.0000 mg | ORAL_TABLET | Freq: Three times a day (TID) | ORAL | 0 refills | Status: DC | PRN

## 2018-11-16 MED ORDER — OXYCODONE HCL 5 MG PO TABS: 10.0000 mg | ORAL_TABLET | ORAL | Status: DC | PRN

## 2018-11-16 MED ORDER — ONDANSETRON HCL 4 MG/2ML IJ SOLN: 4.0000 mg | Freq: Four times a day (QID) | INTRAMUSCULAR | Status: DC | PRN

## 2018-11-16 MED ORDER — PROPOFOL 10 MG/ML IV BOLUS
INTRAVENOUS | Status: AC
  Filled 2018-11-16: qty 20

## 2018-11-16 MED ORDER — CEFAZOLIN SODIUM-DEXTROSE 2-4 GM/100ML-% IV SOLN
2.0000 g | Freq: Four times a day (QID) | INTRAVENOUS | Status: AC
  Administered 2018-11-16 – 2018-11-17 (×3): 2 g via INTRAVENOUS
  Filled 2018-11-16 (×3): qty 100

## 2018-11-16 MED ORDER — EPHEDRINE SULFATE 50 MG/ML IJ SOLN
INTRAMUSCULAR | Status: DC | PRN
  Administered 2018-11-16 (×2): 10 mg via INTRAVENOUS

## 2018-11-16 MED ORDER — PANTOPRAZOLE SODIUM 40 MG PO TBEC
40.0000 mg | DELAYED_RELEASE_TABLET | Freq: Every day | ORAL | Status: DC
  Administered 2018-11-17 – 2018-11-19 (×3): 40 mg via ORAL
  Filled 2018-11-16 (×3): qty 1

## 2018-11-16 MED ORDER — FOLIC ACID 1 MG PO TABS
1.0000 mg | ORAL_TABLET | Freq: Every day | ORAL | Status: DC
  Administered 2018-11-17 – 2018-11-19 (×3): 1 mg via ORAL
  Filled 2018-11-16 (×3): qty 1

## 2018-11-16 MED ORDER — ACETAMINOPHEN 500 MG PO TABS
1000.0000 mg | ORAL_TABLET | Freq: Four times a day (QID) | ORAL | Status: AC
  Administered 2018-11-16 – 2018-11-17 (×3): 1000 mg via ORAL
  Filled 2018-11-16 (×3): qty 2

## 2018-11-16 MED ORDER — SODIUM CHLORIDE 0.9 % IV SOLN
INTRAVENOUS | Status: DC
  Administered 2018-11-16: 15:00:00 via INTRAVENOUS

## 2018-11-16 MED ORDER — ONDANSETRON HCL 4 MG/2ML IJ SOLN
INTRAMUSCULAR | Status: DC | PRN
  Administered 2018-11-16: 4 mg via INTRAVENOUS

## 2018-11-16 MED ORDER — METOCLOPRAMIDE HCL 5 MG PO TABS: 5.0000 mg | ORAL_TABLET | Freq: Three times a day (TID) | ORAL | Status: DC | PRN

## 2018-11-16 MED ORDER — DIPHENHYDRAMINE HCL 12.5 MG/5ML PO ELIX: 25.0000 mg | ORAL_SOLUTION | ORAL | Status: DC | PRN

## 2018-11-16 MED ORDER — OXYCODONE HCL ER 10 MG PO T12A: 10.0000 mg | EXTENDED_RELEASE_TABLET | Freq: Two times a day (BID) | ORAL | Status: DC

## 2018-11-16 MED ORDER — ESCITALOPRAM OXALATE 10 MG PO TABS
10.0000 mg | ORAL_TABLET | Freq: Every day | ORAL | Status: DC
  Administered 2018-11-17 – 2018-11-19 (×3): 10 mg via ORAL
  Filled 2018-11-16 (×3): qty 1

## 2018-11-16 MED ORDER — FENTANYL CITRATE (PF) 250 MCG/5ML IJ SOLN
INTRAMUSCULAR | Status: DC | PRN
  Administered 2018-11-16: 50 ug via INTRAVENOUS

## 2018-11-16 MED ORDER — ALUM & MAG HYDROXIDE-SIMETH 200-200-20 MG/5ML PO SUSP: 30.0000 mL | ORAL | Status: DC | PRN

## 2018-11-16 MED ORDER — FERROUS SULFATE 325 (65 FE) MG PO TABS
325.0000 mg | ORAL_TABLET | Freq: Every day | ORAL | Status: DC
  Administered 2018-11-17 – 2018-11-19 (×3): 325 mg via ORAL
  Filled 2018-11-16 (×3): qty 1

## 2018-11-16 MED ORDER — INSULIN ASPART 100 UNIT/ML ~~LOC~~ SOLN: 0.0000 [IU] | Freq: Every day | SUBCUTANEOUS | Status: DC

## 2018-11-16 MED ORDER — EPHEDRINE 5 MG/ML INJ
INTRAVENOUS | Status: AC
  Filled 2018-11-16: qty 10

## 2018-11-16 MED ORDER — 0.9 % SODIUM CHLORIDE (POUR BTL) OPTIME
TOPICAL | Status: DC | PRN
  Administered 2018-11-16: 1000 mL

## 2018-11-16 MED ORDER — PROMETHAZINE HCL 25 MG/ML IJ SOLN
INTRAMUSCULAR | Status: AC
  Filled 2018-11-16: qty 1

## 2018-11-16 MED ORDER — GABAPENTIN 300 MG PO CAPS: 300.0000 mg | ORAL_CAPSULE | Freq: Three times a day (TID) | ORAL | Status: DC

## 2018-11-16 MED ORDER — METHOCARBAMOL 1000 MG/10ML IJ SOLN
500.0000 mg | Freq: Four times a day (QID) | INTRAVENOUS | Status: DC | PRN
  Filled 2018-11-16: qty 5

## 2018-11-16 MED ORDER — PROMETHAZINE HCL 25 MG/ML IJ SOLN
6.2500 mg | INTRAMUSCULAR | Status: DC | PRN
  Administered 2018-11-16: 6.25 mg via INTRAVENOUS

## 2018-11-16 MED ORDER — SORBITOL 70 % SOLN: 30.0000 mL | Freq: Every day | Status: DC | PRN

## 2018-11-16 MED ORDER — DOCUSATE SODIUM 100 MG PO CAPS: 100.0000 mg | ORAL_CAPSULE | Freq: Two times a day (BID) | ORAL | Status: DC

## 2018-11-16 MED ORDER — SENNOSIDES-DOCUSATE SODIUM 8.6-50 MG PO TABS: 1.0000 | ORAL_TABLET | Freq: Every evening | ORAL | 1 refills | Status: DC | PRN

## 2018-11-16 MED ORDER — TRANEXAMIC ACID-NACL 1000-0.7 MG/100ML-% IV SOLN
1000.0000 mg | Freq: Once | INTRAVENOUS | Status: AC
  Administered 2018-11-16: 1000 mg via INTRAVENOUS
  Filled 2018-11-16: qty 100

## 2018-11-16 MED ORDER — CEPHALEXIN 500 MG PO CAPS: 500.0000 mg | ORAL_CAPSULE | Freq: Four times a day (QID) | ORAL | 0 refills | Status: DC

## 2018-11-16 MED ORDER — HYDROMORPHONE HCL 1 MG/ML IJ SOLN
INTRAMUSCULAR | Status: AC
  Filled 2018-11-16: qty 1

## 2018-11-16 MED ORDER — SODIUM CHLORIDE 0.9 % IV SOLN
INTRAVENOUS | Status: DC | PRN
  Administered 2018-11-16: 15 ug/min via INTRAVENOUS

## 2018-11-16 MED ORDER — VANCOMYCIN HCL 1000 MG IV SOLR
INTRAVENOUS | Status: AC
  Filled 2018-11-16: qty 1000

## 2018-11-16 MED ORDER — PROMETHAZINE HCL 25 MG PO TABS: 25.0000 mg | ORAL_TABLET | Freq: Four times a day (QID) | ORAL | 1 refills | Status: DC | PRN

## 2018-11-16 MED ORDER — POTASSIUM CHLORIDE CRYS ER 20 MEQ PO TBCR
20.0000 meq | EXTENDED_RELEASE_TABLET | Freq: Every day | ORAL | Status: DC
  Administered 2018-11-17 – 2018-11-19 (×3): 20 meq via ORAL
  Filled 2018-11-16 (×3): qty 1

## 2018-11-16 MED ORDER — KETOROLAC TROMETHAMINE 15 MG/ML IJ SOLN
15.0000 mg | Freq: Four times a day (QID) | INTRAMUSCULAR | Status: AC
  Administered 2018-11-16 – 2018-11-17 (×3): 15 mg via INTRAVENOUS
  Filled 2018-11-16 (×3): qty 1

## 2018-11-16 SURGICAL SUPPLY — 63 items
BAG DECANTER FOR FLEXI CONT (MISCELLANEOUS) ×2 IMPLANT
CELLS DAT CNTRL 66122 CELL SVR (MISCELLANEOUS) ×1 IMPLANT
CLSR STERI-STRIP ANTIMIC 1/2X4 (GAUZE/BANDAGES/DRESSINGS) ×1 IMPLANT
COVER PERINEAL POST (MISCELLANEOUS) ×2 IMPLANT
COVER SURGICAL LIGHT HANDLE (MISCELLANEOUS) ×2 IMPLANT
COVER WAND RF STERILE (DRAPES) ×1 IMPLANT
CUP ACET PNNCL SECTR W/GRIP 56 (Hips) ×1 IMPLANT
CUP SECTOR GRIPTON 58MM (Orthopedic Implant) ×2 IMPLANT
DRAPE C-ARM 42X72 X-RAY (DRAPES) ×2 IMPLANT
DRAPE POUCH INSTRU U-SHP 10X18 (DRAPES) ×2 IMPLANT
DRAPE STERI IOBAN 125X83 (DRAPES) ×2 IMPLANT
DRAPE U-SHAPE 47X51 STRL (DRAPES) ×4 IMPLANT
DRSG AQUACEL AG ADV 3.5X10 (GAUZE/BANDAGES/DRESSINGS) ×2 IMPLANT
DURAPREP 26ML APPLICATOR (WOUND CARE) ×3 IMPLANT
ELECT BLADE 4.0 EZ CLEAN MEGAD (MISCELLANEOUS) ×2
ELECT REM PT RETURN 9FT ADLT (ELECTROSURGICAL) ×2
ELECTRODE BLDE 4.0 EZ CLN MEGD (MISCELLANEOUS) ×1 IMPLANT
ELECTRODE REM PT RTRN 9FT ADLT (ELECTROSURGICAL) ×1 IMPLANT
GLOVE BIOGEL PI IND STRL 7.0 (GLOVE) ×1 IMPLANT
GLOVE BIOGEL PI INDICATOR 7.0 (GLOVE) ×3
GLOVE ECLIPSE 7.0 STRL STRAW (GLOVE) ×4 IMPLANT
GLOVE SKINSENSE NS SZ7.5 (GLOVE) ×1
GLOVE SKINSENSE STRL SZ7.5 (GLOVE) ×1 IMPLANT
GLOVE SURG SYN 7.5  E (GLOVE) ×4
GLOVE SURG SYN 7.5 E (GLOVE) ×4 IMPLANT
GLOVE SURG SYN 7.5 PF PI (GLOVE) ×4 IMPLANT
GOWN STRL REIN XL XLG (GOWN DISPOSABLE) ×2 IMPLANT
GOWN STRL REUS W/ TWL LRG LVL3 (GOWN DISPOSABLE) IMPLANT
GOWN STRL REUS W/ TWL XL LVL3 (GOWN DISPOSABLE) ×1 IMPLANT
GOWN STRL REUS W/TWL LRG LVL3 (GOWN DISPOSABLE)
GOWN STRL REUS W/TWL XL LVL3 (GOWN DISPOSABLE) ×2
HANDPIECE INTERPULSE COAX TIP (DISPOSABLE) ×2
HEAD CERAMIC BIO DELTA 36 (Hips) ×1 IMPLANT
HOOD PEEL AWAY FLYTE STAYCOOL (MISCELLANEOUS) ×4 IMPLANT
IV NS IRRIG 3000ML ARTHROMATIC (IV SOLUTION) ×2 IMPLANT
KIT BASIN OR (CUSTOM PROCEDURE TRAY) ×2 IMPLANT
LINER NEUTRAL 36X58 PLUS4 ×1 IMPLANT
LINER NEUTRAL 52X36MM PLUS 4 (Liner) ×1 IMPLANT
MARKER SKIN DUAL TIP RULER LAB (MISCELLANEOUS) ×2 IMPLANT
NDL SPNL 18GX3.5 QUINCKE PK (NEEDLE) ×1 IMPLANT
NEEDLE SPNL 18GX3.5 QUINCKE PK (NEEDLE) ×2 IMPLANT
PACK TOTAL JOINT (CUSTOM PROCEDURE TRAY) ×2 IMPLANT
PACK UNIVERSAL I (CUSTOM PROCEDURE TRAY) ×2 IMPLANT
PIN SECTOR W/GRIP ACE CUP 52MM (Hips) ×1 IMPLANT
PINN SECTOR W/GRIP ACE CUP 56 (Hips) ×2 IMPLANT
PINNACLE ALTRX PLUS 4 N 36X56 (Hips) ×1 IMPLANT
RETRACTOR WND ALEXIS 18 MED (MISCELLANEOUS) IMPLANT
RTRCTR WOUND ALEXIS 18CM MED (MISCELLANEOUS) ×2
SAW OSC TIP CART 19.5X105X1.3 (SAW) ×2 IMPLANT
SCREW 6.5MMX25MM (Screw) ×2 IMPLANT
SCREW 6.5MMX35MM (Screw) ×2 IMPLANT
SET HNDPC FAN SPRY TIP SCT (DISPOSABLE) ×1 IMPLANT
STAPLER VISISTAT 35W (STAPLE) IMPLANT
STEM FEMORAL SZ 5MM STD ACTIS (Stem) ×1 IMPLANT
SUT ETHIBOND 2 V 37 (SUTURE) ×2 IMPLANT
SUT VIC AB 1 CTX 36 (SUTURE) ×2
SUT VIC AB 1 CTX36XBRD ANBCTR (SUTURE) ×1 IMPLANT
SUT VIC AB 2-0 CT1 27 (SUTURE) ×4
SUT VIC AB 2-0 CT1 TAPERPNT 27 (SUTURE) ×2 IMPLANT
SYR 50ML LL SCALE MARK (SYRINGE) ×2 IMPLANT
TOWEL GREEN STERILE (TOWEL DISPOSABLE) ×2 IMPLANT
TRAY CATH 16FR W/PLASTIC CATH (SET/KITS/TRAYS/PACK) IMPLANT
YANKAUER SUCT BULB TIP NO VENT (SUCTIONS) ×2 IMPLANT

## 2018-11-16 NOTE — Discharge Instructions (Signed)

## 2018-11-16 NOTE — Anesthesia Preprocedure Evaluation (Signed)
Anesthesia Evaluation  Patient identified by MRN, date of birth, ID band Patient awake    Reviewed: Allergy & Precautions, Patient's Chart, lab work & pertinent test results  Airway Mallampati: II  TM Distance: >3 FB Neck ROM: Full    Dental no notable dental hx.    Pulmonary neg pulmonary ROS,    Pulmonary exam normal breath sounds clear to auscultation       Cardiovascular hypertension, Pt. on medications and Pt. on home beta blockers Normal cardiovascular exam Rhythm:Regular Rate:Normal     Neuro/Psych Anxiety  Neuromuscular disease    GI/Hepatic Neg liver ROS, hiatal hernia, GERD  Medicated,  Endo/Other  diabetes, Type 2, Oral Hypoglycemic Agents  Renal/GU negative Renal ROS     Musculoskeletal  (+) Arthritis ,   Abdominal   Peds  Hematology  (+) anemia ,   Anesthesia Other Findings   Reproductive/Obstetrics                             Anesthesia Physical  Anesthesia Plan  ASA: III  Anesthesia Plan: Spinal   Post-op Pain Management:    Induction: Intravenous  PONV Risk Score and Plan: 2 and Ondansetron, Treatment may vary due to age or medical condition and Midazolam  Airway Management Planned: Simple Face Mask  Additional Equipment: None  Intra-op Plan:   Post-operative Plan:   Informed Consent: I have reviewed the patients History and Physical, chart, labs and discussed the procedure including the risks, benefits and alternatives for the proposed anesthesia with the patient or authorized representative who has indicated his/her understanding and acceptance.     Dental advisory given  Plan Discussed with: CRNA and Anesthesiologist  Anesthesia Plan Comments:         Anesthesia Quick Evaluation

## 2018-11-16 NOTE — Transfer of Care (Signed)
Immediate Anesthesia Transfer of Care Note  Patient: Brittany [name]  Procedure(s) Performed: LEFT TOTAL HIP ARTHROPLASTY ANTERIOR APPROACH (Left )  Patient Location: PACU  Anesthesia Type:MAC and Spinal  Level of Consciousness: awake, alert  and oriented  Airway & Oxygen Therapy: Patient Spontanous Breathing  Post-op Assessment: Report given to RN  Post vital signs: Reviewed and stable  Last Vitals:  Vitals Value Taken Time  BP    Temp    Pulse 60 11/16/18 1229  Resp 8 11/16/18 1229  SpO2 94 % 11/16/18 1229  Vitals shown include unvalidated device data.  Last Pain:  Vitals:   11/16/18 0846  TempSrc:   PainSc: 10-Worst pain ever      Patients Stated Pain Goal: 3 (05/39/76 7341)  Complications: No apparent anesthesia complications

## 2018-11-16 NOTE — H&P (Signed)
PREOPERATIVE H&P  Chief Complaint: left hip degenerative joint disease  HPI: Brittany [name] is a 73 y.o. female who presents for surgical treatment of left hip degenerative joint disease.  She denies any changes in medical history.  Past Medical History:  Diagnosis Date  . Anemia    as a younger woman  . Arthritis    "all over my body" (10/01/2017)  . Chronic pain    In pain clinic   . Diabetic peripheral neuropathy ([place])   . GERD (gastroesophageal reflux disease)   . High cholesterol   . History of hiatal hernia   . Hypertension   . Type II diabetes mellitus ([place]) dx'd ~ 2008   Past Surgical History:  Procedure Laterality Date  . AMPUTATION Right 09/29/2017   Procedure: AMPUTATION RIGHT 1ST RAY;  Surgeon: Leandrew Koyanagi, MD;  Location: [facility];  Service: Orthopedics;  Laterality: Right;  . AMPUTATION TOE Right 04/24/2018   Procedure: RIGHT 2ND TOE PROXIMAL INTERPHALANGEAL JOINT DISARTICULATION;  Surgeon: Leandrew Koyanagi, MD;  Location: [facility];  Service: Orthopedics;  Laterality: Right;  . APPLICATION OF WOUND VAC Right 09/29/2017   "foot"  . BACK SURGERY    . COLONOSCOPY    . I&D EXTREMITY Right 08/10/2017   Procedure: IRRIGATION AND DEBRIDEMENT FOOT;  Surgeon: Leandrew Koyanagi, MD;  Location: [facility];  Service: Orthopedics;  Laterality: Right;  . [place]  . POSTERIOR LUMBAR FUSION  1996  . TOE SURGERY Right 1980s   bone removed "inbetween my toes"  . VAGINAL HYSTERECTOMY     Social History   Socioeconomic History  . Marital status: Widowed    Spouse name: Not on file  . Number of children: Not on file  . Years of education: Not on file  . Highest education level: Not on file  Occupational History  . Not on file  Social Needs  . Financial resource strain: Not on file  . Food insecurity    Worry: Not on file    Inability: Not on file  . Transportation needs    Medical: Not on file    Non-medical: Not on file  Tobacco Use  .  Smoking status: Never Smoker  . Smokeless tobacco: Never Used  Substance and Sexual Activity  . Alcohol use: Never    Frequency: Never  . Drug use: Never  . Sexual activity: Not Currently    Partners: Male  Lifestyle  . Physical activity    Days per week: Not on file    Minutes per session: Not on file  . Stress: Not on file  Relationships  . Social Herbalist on phone: Not on file    Gets together: Not on file    Attends religious service: Not on file    Active member of club or organization: Not on file    Attends meetings of clubs or organizations: Not on file    Relationship status: Not on file  Other Topics Concern  . Not on file  Social History Narrative  . Not on file   Family History  Problem Relation Age of Onset  . Cancer Mother        breast  . Depression Mother        bipolar  . Breast cancer Mother 81  . Heart disease Father 60       MI  . Diabetes Brother   . Kidney disease Brother   .  Hypertension Brother   . Hyperlipidemia Brother   . Stroke Brother   . Coronary artery disease Other   . Diabetes Other    Allergies  Allergen Reactions  . Sulfonamide Derivatives Hives   Prior to Admission medications   Medication Sig Start Date End Date Taking? Authorizing Provider  amLODipine (NORVASC) 5 MG tablet Take 1 tablet (5 mg total) by mouth daily. 05/07/18  Yes Roma Schanz R, DO  aspirin 81 MG tablet Take 81 mg by mouth at bedtime.    Yes [provider]  atorvastatin (LIPITOR) 10 MG tablet Take 1 tablet (10 mg total) by mouth daily. 05/07/18  Yes Ann Held, DO  bisoprolol-hydrochlorothiazide (ZIAC) 10-6.25 MG tablet Take 1 tablet by mouth daily. 05/07/18  Yes Roma Schanz R, DO  docusate sodium (COLACE) 100 MG capsule Take 100 mg by mouth 2 (two) times daily.   Yes [provider]  doxepin (SINEQUAN) 10 MG capsule Take 10 mg by mouth at bedtime as needed (for sleep).   Yes [provider]   escitalopram (LEXAPRO) 10 MG tablet TAKE 1 TABLET DAILY Patient taking differently: Take 10 mg by mouth daily.  06/08/18  Yes Roma Schanz R, DO  estradiol (ESTRACE) 1 MG tablet Take 1 tablet (1 mg total) by mouth daily. 05/07/18  Yes Roma Schanz R, DO  ferrous sulfate 325 (65 FE) MG tablet Take 325 mg by mouth daily with breakfast.   Yes [provider]  folic acid (FOLVITE) 948 MCG tablet Take 800 mcg by mouth daily.    Yes [provider]  furosemide (LASIX) 20 MG tablet Take 1 tablet (20 mg total) by mouth daily. 04/17/18  Yes Roma Schanz R, DO  gabapentin (NEURONTIN) 800 MG tablet Take 1 tablet (800 mg total) by mouth 4 (four) times daily. 10/16/17  Yes Lowne Chase, Yvonne R, DO  KLOR-CON M20 20 MEQ tablet TAKE 1 TABLET DAILY Patient taking differently: Take 20 mEq by mouth daily.  10/28/18  Yes Roma Schanz R, DO  morphine (MS CONTIN) 60 MG 12 hr tablet Take 60 mg by mouth every 12 (twelve) hours.  10/08/17  Yes [provider]  Multiple Vitamins-Minerals (MULTIVITAMIN GUMMIES WOMENS PO) Take 1 tablet by mouth daily.    Yes [provider]  Omega-3 Fatty Acids (FISH OIL) 1000 MG CAPS Take 1,000 mg by mouth daily.   Yes [provider]  omeprazole (PRILOSEC) 40 MG capsule TAKE 1 CAPSULE DAILY Patient taking differently: Take 40 mg by mouth daily.  07/31/18  Yes Ann Held, DO  sitaGLIPtin-metformin (JANUMET) 50-1000 MG tablet Take 2 tablets po at bedtime daily Patient taking differently: Take 2 tablets by mouth at bedtime.  08/11/18  Yes Ann Held, DO  Specialty Vitamins Products (BIOTIN PLUS KERATIN) 10000-100 MCG-MG TABS Take 1 tablet by mouth daily.   Yes [provider]  tiZANidine (ZANAFLEX) 4 MG tablet Take 4 mg by mouth 3 (three) times daily.  08/26/10  Yes [provider]  Blood Glucose Monitoring Suppl (ONE TOUCH ULTRA MINI) w/Device KIT Use as directed once a day.  Dx Code:   E11.42 Patient not taking: Reported on 11/10/2018 05/01/17   Roma Schanz R, DO  glucose blood (ONE TOUCH ULTRA TEST) test strip Check Blood sugar once daily Patient not taking: Reported on 11/10/2018 02/07/17   Carollee Herter, Alferd Apa, DO  ONETOUCH DELICA LANCETS 54O MISC Check blood sugar once daily  Patient not taking: Reported on 11/10/2018 02/07/17   Roma Schanz R, DO     Positive ROS: All other systems have been reviewed and were otherwise negative with the exception of those mentioned in the HPI and as above.  Physical Exam: General: Alert, no acute distress Cardiovascular: No pedal edema Respiratory: No cyanosis, no use of accessory musculature GI: abdomen soft Skin: No lesions in the area of chief complaint Neurologic: Sensation intact distally Psychiatric: Patient is competent for consent with normal mood and affect Lymphatic: no lymphedema  MUSCULOSKELETAL: exam stable  Assessment: left hip degenerative joint disease  Plan: Plan for Procedure(s): LEFT TOTAL HIP ARTHROPLASTY ANTERIOR APPROACH  The risks benefits and alternatives were discussed with the patient including but not limited to the risks of nonoperative treatment, versus surgical intervention including infection, bleeding, nerve injury,  blood clots, cardiopulmonary complications, morbidity, mortality, among others, and they were willing to proceed.   Preoperative templating of the joint replacement has been completed, documented, and submitted to the Operating Room personnel in order to optimize intra-operative equipment management.  Eduard Roux, MD   11/16/2018 6:55 AM

## 2018-11-16 NOTE — Evaluation (Signed)
Physical Therapy Evaluation Patient Details Name: Brittany [name] MRN: [id number] DOB: December 29, 1945 Today's Date: 11/16/2018   History of Present Illness  Pt is a 73 y/o female s/p L THA. 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   Clinical Impression  Pt is s/p surgery above with deficits below. Pt requiring min to mod A +2 for transfer to chair using RW. Required continuous cues to maintain appropriate weightbearing status and pt with difficulty maintaining. Educated about supine HEP. Pt reports she plans to go home with a friend. Will continue to follow acutely to maximize functional mobility independence and safety.     Follow Up Recommendations Follow surgeon's recommendation for DC plan and follow-up therapies;Supervision for mobility/OOB    Equipment Recommendations  None recommended by PT    Recommendations for Other Services OT consult     Precautions / Restrictions Precautions Precautions: Fall Restrictions Weight Bearing Restrictions: Yes LLE Weight Bearing: Touchdown weight bearing      Mobility  Bed Mobility Overal bed mobility: Needs Assistance Bed Mobility: Supine to Sit     Supine to sit: Min assist     General bed mobility comments: Min A for LLE assist. INcreased time required to come to EOB.   Transfers Overall transfer level: Needs assistance Equipment used: Rolling walker (2 wheeled) Transfers: Sit to/from [facility] Sit to Stand: Mod assist;+2 physical assistance Stand pivot transfers: Min assist;+2 physical assistance       General transfer comment: Mod A +2 for lift assist and steadying assist to stand. Cues to maintain touchdown weightbearing on LLE throughout, however, unsure if pt was able to maintain. Min A +2 to perform stand pivot to chair. Cues for sequencing using RW.   Ambulation/Gait                Stairs            Wheelchair Mobility    Modified Rankin (Stroke  Patients Only)       Balance Overall balance assessment: Needs assistance Sitting-balance support: No upper extremity supported;Feet supported Sitting balance-Leahy Scale: Fair     Standing balance support: Bilateral upper extremity supported;During functional activity Standing balance-Leahy Scale: Poor Standing balance comment: Reliant on UE and external support                             Pertinent Vitals/Pain Pain Assessment: 0-10 Pain Score: 8  Pain Location: L hip Pain Descriptors / Indicators: Aching;Operative site guarding Pain Intervention(s): Limited activity within patient's tolerance;Monitored during session;Repositioned    Home Living Family/patient expects to be discharged to:: Private residence Living Arrangements: Alone Available Help at Discharge: Friend(s);Available 24 hours/day Type of Home: House Home Access: Stairs to enter Entrance Stairs-Rails: None Entrance Stairs-Number of Steps: 1 Home Layout: One level Home Equipment: Grab bars - tub/shower;Other (comment);Walker - 2 wheels;Bedside commode(walking stick )      Prior Function Level of Independence: Independent with assistive device(s)         Comments: Was using walking stick for mobility     Hand Dominance        Extremity/Trunk Assessment   Upper Extremity Assessment Upper Extremity Assessment: Defer to OT evaluation    Lower Extremity Assessment Lower Extremity Assessment: LLE deficits/detail LLE Deficits / Details: Deficits consistent with post op pain and weakness. Limited ROM secondary to pain.        Communication   Communication:  No difficulties  Cognition Arousal/Alertness: Awake/alert Behavior During Therapy: WFL for tasks assessed/performed Overall Cognitive Status: Within Functional Limits for tasks assessed                                        General Comments      Exercises Total Joint Exercises Ankle Circles/Pumps:  AROM;Both;10 reps;Supine Quad Sets: AROM;Left;10 reps;Supine Heel Slides: AROM;Left;5 reps;Supine;Limitations Heel Slides Limitations: Limited ROM    Assessment/Plan    PT Assessment Patient needs continued PT services  PT Problem List Decreased strength;Decreased range of motion;Decreased activity tolerance;Decreased balance;Decreased mobility;Decreased knowledge of use of DME;Decreased knowledge of precautions;Pain       PT Treatment Interventions DME instruction;Gait training;Functional mobility training;Therapeutic activities;Stair training;Therapeutic exercise;Balance training;Patient/family education    PT Goals (Current goals can be found in the Care Plan section)  Acute Rehab PT Goals Patient Stated Goal: to go home PT Goal Formulation: With patient Time For Goal Achievement: 11/30/18 Potential to Achieve Goals: Good    Frequency 7X/week   Barriers to discharge        Co-evaluation               AM-PAC PT "6 Clicks" Mobility  Outcome Measure Help needed turning from your back to your side while in a flat bed without using bedrails?: A Little Help needed moving from lying on your back to sitting on the side of a flat bed without using bedrails?: A Little Help needed moving to and from a bed to a chair (including a wheelchair)?: A Lot Help needed standing up from a chair using your arms (e.g., wheelchair or bedside chair)?: A Lot Help needed to walk in hospital room?: A Lot Help needed climbing 3-5 steps with a railing? : Total 6 Click Score: 13    End of Session Equipment Utilized During Treatment: Gait belt Activity Tolerance: Patient limited by pain Patient left: in chair;with call bell/phone within reach;with family/visitor present;with nursing/sitter in room Nurse Communication: Mobility status PT Visit Diagnosis: Other abnormalities of gait and mobility (R26.89);Muscle weakness (generalized) (M62.81);Difficulty in walking, not elsewhere classified  (R26.2);Pain Pain - Right/Left: Left Pain - part of body: Hip    Time: 5643-3295 PT Time Calculation (min) (ACUTE ONLY): 31 min   Charges:   PT Evaluation $PT Eval Low Complexity: 1 Low PT Treatments $Therapeutic Activity: 8-22 mins        Leighton Ruff, PT, DPT  Acute Rehabilitation Services  Pager: [phone] Office: [phone]   Rudean Hitt 11/16/2018, 6:20 PM

## 2018-11-16 NOTE — Anesthesia Procedure Notes (Signed)
Procedure Name: MAC Date/Time: 11/16/2018 9:52 AM Performed by: Barrington Ellison, CRNA Pre-anesthesia Checklist: Patient identified, Emergency Drugs available, Suction available and Patient being monitored Patient Re-evaluated:Patient Re-evaluated prior to induction Oxygen Delivery Method: Simple face mask

## 2018-11-16 NOTE — Op Note (Signed)
LEFT TOTAL HIP ARTHROPLASTY ANTERIOR APPROACH  Procedure Note NELIA ROGOFF   [id number]  Pre-op Diagnosis: left hip degenerative joint disease     Post-op Diagnosis: same   Operative Procedures  1. Total hip replacement; Left hip; uncemented [id number]   Personnel  Surgeon(s): Leandrew Koyanagi, MD  Assist: April Green, RNFA   Anesthesia: spinal  Prosthesis: Depuy Acetabulum: Pinnacle 58 mm Femur: Actis 5 STD Head: 36 mm size: +12 Liner: +4 neutral Bearing Type: ceramic on poly  Total Hip Arthroplasty (Anterior Approach) Op Note:  After informed consent was obtained and the operative extremity marked in the holding area, the patient was brought back to the operating room and placed supine on the HANA table. Next, the operative extremity was prepped and draped in normal sterile fashion. Surgical timeout occurred verifying patient identification, surgical site, surgical procedure and administration of antibiotics.  A modified anterior Smith-Peterson approach to the hip was performed, using the interval between tensor fascia lata and sartorius.  Dissection was carried bluntly down onto the anterior hip capsule. The lateral femoral circumflex vessels were identified and coagulated. A capsulotomy was performed and the capsular flaps tagged for later repair.  Fluoroscopy was utilized to prepare for the femoral neck cut. The neck osteotomy was performed. The femoral head was removed, the acetabular rim was cleared of soft tissue and attention was turned to reaming the acetabulum.  Sequential reaming was performed under fluoroscopic guidance. We reamed to a size 57 mm, and then impacted the acetabular shell.  A nondisplaced posterior column acetabular fracture was recognized.  Two cancellous screws were inserted for additional fixation and stability.  The liner was then placed after irrigation and attention turned to the femur.  After placing the femoral hook, the leg was taken to externally  rotated, extended and adducted position taking care to perform soft tissue releases to allow for adequate mobilization of the femur. Soft tissue was cleared from the shoulder of the greater trochanter and the hook elevator used to improve exposure of the proximal femur. Sequential broaching performed up to a size 5. Trial neck and head were placed. The leg was brought back up to neutral and the construct reduced. The position and sizing of components, offset and leg lengths were checked using fluoroscopy. Stability of the construct was checked in extension and external rotation without any subluxation or impingement of prosthesis. We dislocated the prosthesis, dropped the leg back into position, removed trial components, and irrigated copiously. The final stem and head was then placed, the leg brought back up, the system reduced and fluoroscopy used to verify positioning.  We irrigated, obtained hemostasis and closed the capsule using #2 ethibond suture.  One gram of vancomycin powder was placed in the surgical bed. The fascia was closed with #1 vicryl plus, the deep fat layer was closed with 0 vicryl, the subcutaneous layers closed with 2.0 Vicryl Plus and the skin closed with 4.0 monocryl and steri strips. A sterile dressing was applied. The patient was awakened in the operating room and taken to recovery in stable condition.  All sponge, needle, and instrument counts were correct at the end of the case.   Position: supine  Complications: see description of procedure.  Time Out: performed   Drains/Packing: none  Estimated blood loss: see anesthesia record  Returned to Recovery Room: in good condition.   Antibiotics: yes   Mechanical VTE (DVT) Prophylaxis: sequential compression devices, TED thigh-high  Chemical VTE (DVT) Prophylaxis: aspirin   Fluid Replacement:  see anesthesia record  Specimens Removed: 1 to pathology   Sponge and Instrument Count Correct? yes   PACU: portable radiograph -  low AP   Plan/RTC: Return in 2 weeks for staple removal. Weight Bearing/Load Lower Extremity: touchdown weight bearing  Hip precautions: none Suture Removal: 2 weeks   N. Eduard Roux, MD [facility] 12:22 PM   Implant Name Type Inv. Item Serial No. Manufacturer Lot No. LRB No. Used Action  LINER NEUTRAL 52X36MM PLUS 4 - [id number] Liner LINER NEUTRAL 52X36MM PLUS 4  DEPUY SYNTHES [id number] Left 1 Implanted and Explanted  PIN SECTOR W/GRIP ACE CUP 52MM - [id number] Hips PIN SECTOR W/GRIP ACE CUP 52MM  DEPUY SYNTHES [id number] Left 1 Implanted and Explanted  PINN SECTOR W/GRIP ACE CUP 56 - [id number] Hips PINN SECTOR W/GRIP ACE CUP 56  DEPUY SYNTHES [id number] Left 1 Implanted and Explanted  PINNACLE ALTRX PLUS 4 N 36X56 - [id number] Hips PINNACLE ALTRX PLUS 4 N 36X56  DEPUY SYNTHES [id number] Left 1 Implanted and Explanted  SCREW 6.5MMX25MM - [id number] Screw SCREW 6.5MMX25MM  DEPUY SYNTHES [id number] Left 1 Implanted and Explanted  SCREW 6.5MMX25MM - [id number] Screw SCREW 6.5MMX25MM  DEPUY SYNTHES [id number] Left 1 Implanted and Explanted  CUP SECTOR GRIPTON 58MM - [id number] Orthopedic Implant CUP SECTOR GRIPTON 58MM  DEPUY SYNTHES [id number] Left 1 Implanted  LINER NEUTRAL 57X36MM PLUS4 - [id number]  LINER NEUTRAL 57X36MM PLUS4  DEPUY SYNTHES [id number] Left 1 Implanted  SCREW 6.5MMX35MM - [id number] Screw SCREW 6.5MMX35MM  DEPUY SYNTHES [id number] Left 1 Implanted  SCREW 6.5MMX35MM - [id number] Screw SCREW 6.5MMX35MM  DEPUY SYNTHES [id number] Left 1 Implanted  STEM FEMORAL SZ 5MM STD ACTIS - [id number] Stem STEM FEMORAL SZ 5MM STD ACTIS  DEPUY ORTHOPAEDICS [id number] Left 1 Implanted  HEAD CERAMIC BIO DELTA 36 - [id number] Hips HEAD CERAMIC BIO DELTA 36  DEPUY SYNTHES [id number] Left 1 Implanted

## 2018-11-17 ENCOUNTER — Encounter (HOSPITAL_COMMUNITY): Payer: Self-pay | Admitting: Orthopaedic Surgery

## 2018-11-17 DIAGNOSIS — M160 Bilateral primary osteoarthritis of hip: Secondary | ICD-10-CM | POA: Diagnosis not present

## 2018-11-17 LAB — CBC
HCT: 30 % — ABNORMAL LOW (ref 36.0–46.0)
Hemoglobin: 9.9 g/dL — ABNORMAL LOW (ref 12.0–15.0)
MCH: 28.7 pg (ref 26.0–34.0)
MCHC: 33 g/dL (ref 30.0–36.0)
MCV: 87 fL (ref 80.0–100.0)
Platelets: 165 10*3/uL (ref 150–400)
RBC: 3.45 MIL/uL — ABNORMAL LOW (ref 3.87–5.11)
RDW: 12.9 % (ref 11.5–15.5)
WBC: 10.2 10*3/uL (ref 4.0–10.5)
nRBC: 0 % (ref 0.0–0.2)

## 2018-11-17 LAB — BASIC METABOLIC PANEL
Anion gap: 7 (ref 5–15)
BUN: 9 mg/dL (ref 8–23)
CO2: 28 mmol/L (ref 22–32)
Calcium: 8.2 mg/dL — ABNORMAL LOW (ref 8.9–10.3)
Chloride: 104 mmol/L (ref 98–111)
Creatinine, Ser: 0.63 mg/dL (ref 0.44–1.00)
GFR calc Af Amer: >60 mL/min (ref >60)
GFR calc non Af Amer: >60 mL/min (ref >60)
Glucose, Bld: 121 mg/dL — ABNORMAL HIGH (ref 70–99)
Potassium: 3.7 mmol/L (ref 3.5–5.1)
Sodium: 139 mmol/L (ref 135–145)

## 2018-11-17 LAB — GLUCOSE, CAPILLARY
Glucose-Capillary: 109 mg/dL — ABNORMAL HIGH (ref 70–99)
Glucose-Capillary: 150 mg/dL — ABNORMAL HIGH (ref 70–99)
Glucose-Capillary: 172 mg/dL — ABNORMAL HIGH (ref 70–99)
Glucose-Capillary: 138 mg/dL — ABNORMAL HIGH (ref 70–99)

## 2018-11-17 NOTE — Progress Notes (Signed)
   Subjective:  Patient reports pain as mild.    Objective:   VITALS:   Vitals:   11/16/18 1406 11/16/18 1953 11/17/18 0508 11/17/18 0718  BP: 133/62 (!) 107/49 (!) 83/54 (!) 109/55  Pulse: 65 64 78 69  Resp: 16 16 14 18   Temp: 97.7 F (36.5 C) 98.3 F (36.8 C) 98.1 F (36.7 C) 99 F (37.2 C)  TempSrc: Oral Oral Oral Oral  SpO2: 95% 98% 93% 90%  Weight:      Height:        Neurologically intact Neurovascular intact Sensation intact distally Intact pulses distally Dorsiflexion/Plantar flexion intact Incision: dressing C/D/I and no drainage No cellulitis present Compartment soft   Lab Results  Component Value Date   WBC 10.2 11/17/2018   HGB 9.9 (L) 11/17/2018   HCT 30.0 (L) 11/17/2018   MCV 87.0 11/17/2018   PLT 165 11/17/2018     Assessment/Plan:  1 Day Post-Op   - Expected postop acute blood loss anemia - will monitor for symptoms, repeat CBC in am - Up with PT/OT - DVT ppx - SCDs, ambulation, aspirin - TDWB operative extremity - Pain control - IVFs at 100, repeat BMP in am - Discharge planning - plan to keep patient here until wed or thur to maximize PT   Eduard Roux 11/17/2018, 12:31 PM [phone]

## 2018-11-17 NOTE — Progress Notes (Signed)
Physical Therapy Treatment Patient Details Name: Brittany [name] MRN: [id number] DOB: 12/13/1945 Today's Date: 11/17/2018    History of Present Illness Pt is a 73 y/o female s/p L THA. 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     PT Comments    Pt performed transfer training, bouts of short gt trials and supine LE strengthening. She is requiring decreased assistance this am.  She remains unsteady and will require physical assistance at home.  Pt continues to plan to return home with support from a friend at d/c.     Follow Up Recommendations  Follow surgeon's recommendation for DC plan and follow-up therapies;Supervision for mobility/OOB     Equipment Recommendations  None recommended by PT    Recommendations for Other Services       Precautions / Restrictions Precautions Precautions: Fall Restrictions Weight Bearing Restrictions: Yes LLE Weight Bearing: Touchdown weight bearing(Unable to understand TWB due to neuropathy responds better to NWB)    Mobility  Bed Mobility Overal bed mobility: Needs Assistance Bed Mobility: Supine to Sit     Supine to sit: Supervision     General bed mobility comments: Able to advance to edge of bed unassisted.  Transfers Overall transfer level: Needs assistance Equipment used: Rolling walker (2 wheeled) Transfers: Sit to/from Stand Sit to Stand: Mod assist         General transfer comment: Cues for hand placement.  Pt does better pushing with one hand from seated surface and one on RW hand grip.  Cues for pushing and forward weight shifting required.  Ambulation/Gait Ambulation/Gait assistance: Min assist Gait Distance (Feet): 8 Feet(+ 4 ft x2 to pivot to and from commode.) Assistive device: Rolling walker (2 wheeled) Gait Pattern/deviations: Step-to pattern(hop to pattern.)     General Gait Details: Cues for upper trunk control and maintaining weight bearing.  She presents with L NWB as she  is able to maintain this better than TWB.   Stairs             Wheelchair Mobility    Modified Rankin (Stroke Patients Only)       Balance Overall balance assessment: Needs assistance Sitting-balance support: No upper extremity supported;Feet supported Sitting balance-Leahy Scale: Fair     Standing balance support: Bilateral upper extremity supported;During functional activity Standing balance-Leahy Scale: Poor Standing balance comment: Reliant on UE and external support                            Cognition Arousal/Alertness: Awake/alert Behavior During Therapy: WFL for tasks assessed/performed Overall Cognitive Status: Within Functional Limits for tasks assessed                                        Exercises Total Joint Exercises Ankle Circles/Pumps: AROM;Both;20 reps;Supine Quad Sets: AROM;Left;10 reps;Supine Heel Slides: Left;10 reps;AAROM;Supine Hip ABduction/ADduction: AAROM;Left;10 reps;Supine    General Comments        Pertinent Vitals/Pain Pain Assessment: 0-10 Pain Score: 5  Pain Location: L hip Pain Descriptors / Indicators: Aching;Operative site guarding Pain Intervention(s): Monitored during session;Repositioned    Home Living                      Prior Function            PT Goals (current goals can  now be found in the care plan section) Acute Rehab PT Goals PT Goal Formulation: With patient Potential to Achieve Goals: Good Progress towards PT goals: Progressing toward goals    Frequency    7X/week      PT Plan Current plan remains appropriate    Co-evaluation              AM-PAC PT "6 Clicks" Mobility   Outcome Measure  Help needed turning from your back to your side while in a flat bed without using bedrails?: A Little Help needed moving from lying on your back to sitting on the side of a flat bed without using bedrails?: A Little Help needed moving to and from a bed to a  chair (including a wheelchair)?: A Little Help needed standing up from a chair using your arms (e.g., wheelchair or bedside chair)?: A Little Help needed to walk in hospital room?: A Little Help needed climbing 3-5 steps with a railing? : A Little 6 Click Score: 18    End of Session Equipment Utilized During Treatment: Gait belt Activity Tolerance: Patient limited by pain Patient left: in chair;with call bell/phone within reach;with family/visitor present;with nursing/sitter in room Nurse Communication: Mobility status PT Visit Diagnosis: Other abnormalities of gait and mobility (R26.89);Muscle weakness (generalized) (M62.81);Difficulty in walking, not elsewhere classified (R26.2);Pain Pain - Right/Left: Left Pain - part of body: Hip     Time: 8563-1497 PT Time Calculation (min) (ACUTE ONLY): 21 min  Charges:  $Therapeutic Activity: 8-22 mins                     Governor Rooks, PTA Acute Rehabilitation Services Pager [phone] Office [phone]     Algernon Mundie Eli Hose 11/17/2018, 11:34 AM

## 2018-11-17 NOTE — Evaluation (Addendum)
Occupational Therapy Evaluation Patient Details Name: Brittany [name] MRN: [id number] DOB: [birth year] Today's Date: 11/17/2018    History of Present Illness Pt is a 73 y/o female s/p L THA. 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    Clinical Impression   Pt with decline in function and safety with impaired strength, balance and endurance. PTA, pt lived at home alone with her dog and was independent with ADLs/selfcare and home mgt. Pt now requires max - mod A with LB ADLs, mod A with transfers/mobiliyt using RW and is TWB of L LE. Pt would benefit from acute OT services to address impairments to maximize level of function and safety    Follow Up Recommendations   Follow surgeon's recommendation for DC plan and follow-up therapies;Supervision    Equipment Recommendations  Tub/shower bench;Other (comment)(reacher)    Recommendations for Other Services  none     Precautions / Restrictions Precautions Precautions: Fall Restrictions Weight Bearing Restrictions: Yes LLE Weight Bearing: Touchdown weight bearing      Mobility Bed Mobility Overal bed mobility: Needs Assistance Bed Mobility: Sit to Supine     Supine to sit: Supervision Sit to supine: Min assist   General bed mobility comments: min A with LEs back onto bed  Transfers Overall transfer level: Needs assistance Equipment used: Rolling walker (2 wheeled) Transfers: Sit to/from Stand Sit to Stand: Mod assist         General transfer comment: cues for correct hand placement    Balance Overall balance assessment: Needs assistance Sitting-balance support: No upper extremity supported;Feet supported Sitting balance-Leahy Scale: Fair     Standing balance support: Bilateral upper extremity supported;During functional activity Standing balance-Leahy Scale: Poor Standing balance comment: Reliant on UE and external support                           ADL either performed  or assessed with clinical judgement   ADL Overall ADL's : Needs assistance/impaired Eating/Feeding: Independent;Sitting   Grooming: Wash/dry hands;Wash/dry face;Set up;Supervision/safety;Sitting   Upper Body Bathing: Set up;Supervision/ safety;Sitting   Lower Body Bathing: Moderate assistance   Upper Body Dressing : Supervision/safety;Set up;Sitting   Lower Body Dressing: Maximal assistance;Moderate assistance;Sitting/lateral leans;Cueing for sequencing;Cueing for safety   Toilet Transfer: Moderate assistance;Ambulation;RW;BSC;Cueing for safety;Cueing for sequencing   Toileting- Clothing Manipulation and Hygiene: Moderate assistance;Sit to/from stand       Functional mobility during ADLs: Rolling walker;Supervision/safety;Moderate assistance;Cueing for sequencing;Cueing for safety       Vision Patient Visual Report: No change from baseline       Perception     Praxis      Pertinent Vitals/Pain Pain Assessment: 0-10 Pain Score: 6  Pain Location: L hip Pain Descriptors / Indicators: Aching;Operative site guarding Pain Intervention(s): Monitored during session;Repositioned     Hand Dominance Right   Extremity/Trunk Assessment Upper Extremity Assessment Upper Extremity Assessment: Overall WFL for tasks assessed   Lower Extremity Assessment Lower Extremity Assessment: Defer to PT evaluation   Cervical / Trunk Assessment Cervical / Trunk Assessment: Normal   Communication Communication Communication: No difficulties   Cognition Arousal/Alertness: Awake/alert Behavior During Therapy: WFL for tasks assessed/performed Overall Cognitive Status: Within Functional Limits for tasks assessed                                     General Comments  Exercises Total Joint Exercises Ankle Circles/Pumps: AROM;Both;20 reps;Supine Quad Sets: AROM;Left;10 reps;Supine Heel Slides: Left;10 reps;AAROM;Supine Hip ABduction/ADduction: AAROM;Left;10  reps;Supine   Shoulder Instructions      Home Living Family/patient expects to be discharged to:: Private residence Living Arrangements: Alone Available Help at Discharge: Friend(s);Available 24 hours/day Type of Home: House Home Access: Stairs to enter [employer] of Steps: 1 Entrance Stairs-Rails: None       Bathroom Shower/Tub: Tub/shower unit         Home Equipment: Grab bars - tub/shower;Other (comment);Walker - 2 wheels;Bedside commode(cane)          Prior Functioning/Environment Level of Independence: Independent with assistive device(s)    ADL's / Homemaking Assistance Needed: independent with ADLs/selfcare and home mgt            OT Problem List: Impaired balance (sitting and/or standing);Pain;Decreased activity tolerance;Decreased knowledge of use of DME or AE      OT Treatment/Interventions: Self-care/ADL training;DME and/or AE instruction;Therapeutic activities;Patient/family education    OT Goals(Current goals can be found in the care plan section) Acute Rehab OT Goals Patient Stated Goal: to go home OT Goal Formulation: With patient Time For Goal Achievement: 11/24/18 Potential to Achieve Goals: Good ADL Goals Pt Will Perform Grooming: with set-up;sitting Pt Will Perform Upper Body Bathing: with set-up;sitting Pt Will Perform Lower Body Bathing: with min assist;sitting/lateral leans;with caregiver independent in assisting Pt Will Perform Upper Body Dressing: with set-up;sitting Pt Will Perform Lower Body Dressing: with mod assist;sitting/lateral leans;sit to/from stand;with adaptive equipment;with caregiver independent in assisting;with min assist Pt Will Transfer to Toilet: with min assist;with min guard assist;ambulating;regular height toilet;bedside commode;grab bars Pt Will Perform Toileting - Clothing Manipulation and hygiene: with min assist;sit to/from stand  OT Frequency: Min 2X/week   Barriers to D/C:    no barriers        Co-evaluation              AM-PAC OT "6 Clicks" Daily Activity     Outcome Measure Help from another person eating meals?: None Help from another person taking care of personal grooming?: A Little Help from another person toileting, which includes using toliet, bedpan, or urinal?: A Lot Help from another person bathing (including washing, rinsing, drying)?: A Lot Help from another person to put on and taking off regular upper body clothing?: A Little Help from another person to put on and taking off regular lower body clothing?: A Lot 6 Click Score: 16   End of Session Equipment Utilized During Treatment: Gait belt;Rolling walker;Other (comment)(BSC)  Activity Tolerance: Patient tolerated treatment well Patient left: in bed;with call bell/phone within reach  OT Visit Diagnosis: Unsteadiness on feet (R26.81);Other abnormalities of gait and mobility (R26.89);Muscle weakness (generalized) (M62.81);Pain Pain - Right/Left: Left Pain - part of body: Hip                Time: 8657-8469 OT Time Calculation (min): 27 min Charges:  OT General Charges $OT Visit: 1 Visit OT Evaluation $OT Eval Moderate Complexity: 1 Mod OT Treatments $Self Care/Home Management : 8-22 mins    Britt Bottom 11/17/2018, 2:34 PM

## 2018-11-17 NOTE — Plan of Care (Signed)

## 2018-11-17 NOTE — Progress Notes (Addendum)
Physical Therapy Treatment Patient Details Name: Brittany [name] MRN: [id number] DOB: [birth year] Today's Date: 11/17/2018    History of Present Illness Pt is a 73 y/o female s/p L THA. 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     PT Comments    Pt performed supine LLE exercises and progression of gt training.  Pt remains to require min assistance to mobilize but is showing progress.  Will continue to follow during acute stay.     Follow Up Recommendations  Follow surgeon's recommendation for DC plan and follow-up therapies;Supervision for mobility/OOB     Equipment Recommendations  None recommended by PT    Recommendations for Other Services       Precautions / Restrictions Precautions Precautions: Fall Restrictions Weight Bearing Restrictions: Yes LLE Weight Bearing: Touchdown weight bearing    Mobility  Bed Mobility Overal bed mobility: Needs Assistance Bed Mobility: Supine to Sit     Supine to sit: Supervision Sit to supine: Min assist   General bed mobility comments: min A with LEs back onto bed  Transfers Overall transfer level: Needs assistance Equipment used: Rolling walker (2 wheeled) Transfers: Sit to/from Stand Sit to Stand: Mod assist Stand pivot transfers: Min assist;+2 physical assistance       General transfer comment: cues for correct hand placement  Ambulation/Gait Ambulation/Gait assistance: Min assist Gait Distance (Feet): 16 Feet Assistive device: Rolling walker (2 wheeled) Gait Pattern/deviations: Step-to pattern     General Gait Details: Cues for upper trunk control and maintaining weight bearing.  She presents with L NWB as she is able to maintain this better than TWB.   Stairs             Wheelchair Mobility    Modified Rankin (Stroke Patients Only)       Balance Overall balance assessment: Needs assistance Sitting-balance support: No upper extremity supported;Feet  supported Sitting balance-Leahy Scale: Fair     Standing balance support: Bilateral upper extremity supported;During functional activity Standing balance-Leahy Scale: Poor Standing balance comment: Reliant on UE and external support                            Cognition Arousal/Alertness: Awake/alert Behavior During Therapy: WFL for tasks assessed/performed Overall Cognitive Status: Within Functional Limits for tasks assessed                                        Exercises Total Joint Exercises Ankle Circles/Pumps: AROM;Both;20 reps;Supine Quad Sets: AROM;Left;10 reps;Supine Heel Slides: Left;10 reps;AAROM;Supine Hip ABduction/ADduction: AAROM;Left;10 reps;Supine    General Comments        Pertinent Vitals/Pain Pain Assessment: 0-10 Pain Score: 6  Pain Location: L hip Pain Descriptors / Indicators: Aching;Operative site guarding Pain Intervention(s): Monitored during session;Repositioned    Home Living Family/patient expects to be discharged to:: Private residence Living Arrangements: Alone Available Help at Discharge: Friend(s);Available 24 hours/day Type of Home: House Home Access: Stairs to enter Entrance Stairs-Rails: None   Home Equipment: Grab bars - tub/shower;Other (comment);Walker - 2 wheels;Bedside commode(cane)      Prior Function Level of Independence: Independent with assistive device(s)    ADL's / Homemaking Assistance Needed: independent with ADLs/selfcare and home mgt     PT Goals (current goals can now be found in the care plan section) Acute Rehab PT Goals Patient  Stated Goal: to go home PT Goal Formulation: With patient Potential to Achieve Goals: Good Progress towards PT goals: Progressing toward goals    Frequency    7X/week      PT Plan Current plan remains appropriate    Co-evaluation              AM-PAC PT "6 Clicks" Mobility   Outcome Measure  Help needed turning from your back to your  side while in a flat bed without using bedrails?: A Little Help needed moving from lying on your back to sitting on the side of a flat bed without using bedrails?: A Little Help needed moving to and from a bed to a chair (including a wheelchair)?: A Little Help needed standing up from a chair using your arms (e.g., wheelchair or bedside chair)?: A Little Help needed to walk in hospital room?: A Little Help needed climbing 3-5 steps with a railing? : A Little 6 Click Score: 18    End of Session Equipment Utilized During Treatment: Gait belt Activity Tolerance: Patient limited by pain Patient left: in chair;with call bell/phone within reach;with family/visitor present;with nursing/sitter in room Nurse Communication: Mobility status PT Visit Diagnosis: Other abnormalities of gait and mobility (R26.89);Muscle weakness (generalized) (M62.81);Difficulty in walking, not elsewhere classified (R26.2);Pain Pain - Right/Left: Left Pain - part of body: Hip     Time: 9833-8250 PT Time Calculation (min) (ACUTE ONLY): 15 min  Charges:  $Gait Training: 8-22 mins                     Governor Rooks, PTA Acute Rehabilitation Services Pager [phone] Office [phone]     Maui Britten Eli Hose 11/17/2018, 5:39 PM

## 2018-11-17 NOTE — Telephone Encounter (Signed)
Pt currently admitted for surgery

## 2018-11-18 ENCOUNTER — Other Ambulatory Visit: Payer: Self-pay | Admitting: Family Medicine

## 2018-11-18 DIAGNOSIS — I10 Essential (primary) hypertension: Secondary | ICD-10-CM

## 2018-11-18 DIAGNOSIS — M160 Bilateral primary osteoarthritis of hip: Secondary | ICD-10-CM | POA: Diagnosis not present

## 2018-11-18 LAB — BASIC METABOLIC PANEL
Anion gap: 8 (ref 5–15)
BUN: 8 mg/dL (ref 8–23)
CO2: 29 mmol/L (ref 22–32)
Calcium: 8.4 mg/dL — ABNORMAL LOW (ref 8.9–10.3)
Chloride: 103 mmol/L (ref 98–111)
Creatinine, Ser: 0.64 mg/dL (ref 0.44–1.00)
GFR calc Af Amer: >60 mL/min (ref >60)
GFR calc non Af Amer: >60 mL/min (ref >60)
Glucose, Bld: 129 mg/dL — ABNORMAL HIGH (ref 70–99)
Potassium: 3.7 mmol/L (ref 3.5–5.1)
Sodium: 140 mmol/L (ref 135–145)

## 2018-11-18 LAB — CBC
HCT: 28.8 % — ABNORMAL LOW (ref 36.0–46.0)
Hemoglobin: 9.6 g/dL — ABNORMAL LOW (ref 12.0–15.0)
MCH: 28.7 pg (ref 26.0–34.0)
MCHC: 33.3 g/dL (ref 30.0–36.0)
MCV: 86.2 fL (ref 80.0–100.0)
Platelets: 164 10*3/uL (ref 150–400)
RBC: 3.34 MIL/uL — ABNORMAL LOW (ref 3.87–5.11)
RDW: 13.1 % (ref 11.5–15.5)
WBC: 14 10*3/uL — ABNORMAL HIGH (ref 4.0–10.5)
nRBC: 0 % (ref 0.0–0.2)

## 2018-11-18 LAB — GLUCOSE, CAPILLARY
Glucose-Capillary: 126 mg/dL — ABNORMAL HIGH (ref 70–99)
Glucose-Capillary: 110 mg/dL — ABNORMAL HIGH (ref 70–99)
Glucose-Capillary: 207 mg/dL — ABNORMAL HIGH (ref 70–99)
Glucose-Capillary: 104 mg/dL — ABNORMAL HIGH (ref 70–99)

## 2018-11-18 NOTE — Progress Notes (Signed)
   Subjective:  Patient reports pain as mild.    Objective:   VITALS:   Vitals:   11/17/18 0718 11/17/18 1305 11/17/18 2013 11/18/18 0401  BP: (!) 109/55 (!) 122/47 (!) 118/41 (!) 137/32  Pulse: 69 78 79 73  Resp: 18 18  14   Temp: 99 F (37.2 C) 99.6 F (37.6 C) 99.5 F (37.5 C) 98.7 F (37.1 C)  TempSrc: Oral Oral Oral Oral  SpO2: 90% 97% 100% 96%  Weight:      Height:        Neurologically intact Neurovascular intact Sensation intact distally Intact pulses distally Dorsiflexion/Plantar flexion intact Incision: dressing C/D/I and no drainage No cellulitis present Compartment soft   Lab Results  Component Value Date   WBC 14.0 (H) 11/18/2018   HGB 9.6 (L) 11/18/2018   HCT 28.8 (L) 11/18/2018   MCV 86.2 11/18/2018   PLT 164 11/18/2018     Assessment/Plan:  2 Days Post-Op   - Expected postop acute blood loss anemia - will monitor for symptoms, repeat CBC in am - continue PT - TDWB LLE - continue to maximize PT today - d/c home tomorrow   Eduard Roux 11/18/2018, 8:04 AM [phone]

## 2018-11-18 NOTE — Progress Notes (Signed)
Physical Therapy Treatment Patient Details Name: Brittany [name] MRN: [id number] DOB: June 03, 1945 Today's Date: 11/18/2018    History of Present Illness Pt is a 73 y/o female s/p L THA. 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     PT Comments    Pt performed gt training and functional mobility with emphasis on weight bearing and independence.  Pt presents with minor LOB entering bathroom required cues to push through the RW vs pushing device forward when hopping.  Pt is progressing well.  Will plan for stair training in am.      Follow Up Recommendations  Follow surgeon's recommendation for DC plan and follow-up therapies;Supervision for mobility/OOB     Equipment Recommendations  None recommended by PT    Recommendations for Other Services OT consult     Precautions / Restrictions Precautions Precautions: Fall Restrictions Weight Bearing Restrictions: Yes LLE Weight Bearing: Touchdown weight bearing    Mobility  Bed Mobility Overal bed mobility: Needs Assistance Bed Mobility: Supine to Sit;Sit to Supine     Supine to sit: Supervision Sit to supine: Supervision   General bed mobility comments: Increased time and effort required cues to reposition after moving back to bed.  Transfers Overall transfer level: Needs assistance Equipment used: Rolling walker (2 wheeled) Transfers: Sit to/from Stand Sit to Stand: Min guard         General transfer comment: Cues for hand placement and forward weight shifting.  Pt is slow and gaurded required reminders for weight bearing status.  Ambulation/Gait Ambulation/Gait assistance: Min assist Gait Distance (Feet): 40 Feet Assistive device: Rolling walker (2 wheeled) Gait Pattern/deviations: Step-to pattern;Narrow base of support;Trunk flexed(hop to)     General Gait Details: Cues for safety with hop to pattern.  Ambulates as NWB and rests in TWB.  Pt is progressing well.   Stairs              Wheelchair Mobility    Modified Rankin (Stroke Patients Only)       Balance Overall balance assessment: Needs assistance Sitting-balance support: No upper extremity supported;Feet supported Sitting balance-Leahy Scale: Fair     Standing balance support: Bilateral upper extremity supported;During functional activity Standing balance-Leahy Scale: Poor Standing balance comment: Reliant on UE and external support                            Cognition Arousal/Alertness: Awake/alert Behavior During Therapy: WFL for tasks assessed/performed Overall Cognitive Status: Within Functional Limits for tasks assessed                                 General Comments: for basic functional tasks, may benefit from higher level cognitive assessment       Exercises      General Comments        Pertinent Vitals/Pain Pain Assessment: 0-10 Pain Score: 6  Pain Location: L hip Pain Descriptors / Indicators: Aching;Operative site guarding Pain Intervention(s): Monitored during session;Repositioned    Home Living                      Prior Function            PT Goals (current goals can now be found in the care plan section) Acute Rehab PT Goals Patient Stated Goal: to go home Potential to Achieve Goals: Good Progress  towards PT goals: Progressing toward goals    Frequency    7X/week      PT Plan Current plan remains appropriate    Co-evaluation              AM-PAC PT "6 Clicks" Mobility   Outcome Measure  Help needed turning from your back to your side while in a flat bed without using bedrails?: A Little Help needed moving from lying on your back to sitting on the side of a flat bed without using bedrails?: A Little Help needed moving to and from a bed to a chair (including a wheelchair)?: A Little Help needed standing up from a chair using your arms (e.g., wheelchair or bedside chair)?: A Little Help needed to walk in  hospital room?: A Little Help needed climbing 3-5 steps with a railing? : A Little 6 Click Score: 18    End of Session Equipment Utilized During Treatment: Gait belt Activity Tolerance: Patient limited by pain Patient left: in chair;with call bell/phone within reach;with family/visitor present;with nursing/sitter in room Nurse Communication: Mobility status PT Visit Diagnosis: Other abnormalities of gait and mobility (R26.89);Muscle weakness (generalized) (M62.81);Difficulty in walking, not elsewhere classified (R26.2);Pain Pain - Right/Left: Left Pain - part of body: Hip     Time: 3846-6599 PT Time Calculation (min) (ACUTE ONLY): 19 min  Charges:  $Gait Training: 8-22 mins                     Brittany [name], PTA Acute Rehabilitation Services Pager [phone] Office [phone]     Brittany [name] 11/18/2018, 5:58 PM

## 2018-11-18 NOTE — Anesthesia Postprocedure Evaluation (Signed)
Anesthesia Post Note  Patient: ALYZE LAUF  Procedure(s) Performed: LEFT TOTAL HIP ARTHROPLASTY ANTERIOR APPROACH (Left )     Patient location during evaluation: PACU Anesthesia Type: Spinal Level of consciousness: oriented and awake and alert Pain management: pain level controlled Vital Signs Assessment: post-procedure vital signs reviewed and stable Respiratory status: spontaneous breathing and respiratory function stable Cardiovascular status: blood pressure returned to baseline and stable Postop Assessment: no headache, no backache and no apparent nausea or vomiting Anesthetic complications: no    Last Vitals:  Vitals:   11/18/18 0843 11/18/18 1533  BP: (!) 125/42 (!) 106/51  Pulse: 80 79  Resp:  16  Temp:  37.2 C  SpO2:  96%    Last Pain:  Vitals:   11/18/18 1533  TempSrc: Oral  PainSc:    Pain Goal: Patients Stated Pain Goal: 3 (11/17/18 2147)                 Lynda Rainwater

## 2018-11-18 NOTE — Progress Notes (Signed)
Physical Therapy Treatment Patient Details Name: Brittany [name] MRN: [id number] DOB: 05-23-1945 Today's Date: 11/18/2018    History of Present Illness Pt is a 73 y/o female s/p L THA. 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     PT Comments    Pt performed gt training and functional mobility during session this am.  Pt tolerated LLE exercises with AAROM.  She continues to present with weakness.  Pt continues to benefit from HHPT at d/c.    Follow Up Recommendations  Follow surgeon's recommendation for DC plan and follow-up therapies;Supervision for mobility/OOB     Equipment Recommendations  None recommended by PT    Recommendations for Other Services       Precautions / Restrictions Precautions Precautions: Fall Restrictions Weight Bearing Restrictions: Yes LLE Weight Bearing: Touchdown weight bearing    Mobility  Bed Mobility Overal bed mobility: Needs Assistance Bed Mobility: Supine to Sit;Sit to Supine     Supine to sit: Supervision     General bed mobility comments: Increased time and effort to move to edge of bed.  HOB elevated and + rail use.  Transfers Overall transfer level: Needs assistance Equipment used: Rolling walker (2 wheeled) Transfers: Sit to/from Stand Sit to Stand: Min assist         General transfer comment: Cues for hand placement and forward weight shifting.  Pt is slow and gaurded required reminders for weight bearing status.  Ambulation/Gait Ambulation/Gait assistance: Min assist Gait Distance (Feet): 35 Feet Assistive device: Rolling walker (2 wheeled) Gait Pattern/deviations: Step-to pattern;Narrow base of support;Trunk flexed(hop to)     General Gait Details: Cues for safety with hop to pattern.  Ambulates as NWB and rests in TWB.  Pt is progressing well.   Stairs             Wheelchair Mobility    Modified Rankin (Stroke Patients Only)       Balance Overall balance  assessment: Needs assistance Sitting-balance support: No upper extremity supported;Feet supported Sitting balance-Leahy Scale: Fair     Standing balance support: Bilateral upper extremity supported;During functional activity Standing balance-Leahy Scale: Poor Standing balance comment: Reliant on UE and external support                            Cognition Arousal/Alertness: Awake/alert Behavior During Therapy: WFL for tasks assessed/performed Overall Cognitive Status: Within Functional Limits for tasks assessed                                        Exercises Total Joint Exercises Ankle Circles/Pumps: AROM;Both;20 reps;Supine Quad Sets: AROM;Left;10 reps;Supine Heel Slides: Left;10 reps;AAROM;Supine Hip ABduction/ADduction: AAROM;Left;10 reps;Supine Straight Leg Raises: AAROM;Left;10 reps;Supine    General Comments        Pertinent Vitals/Pain Pain Assessment: 0-10 Pain Score: 6  Pain Location: L hip Pain Descriptors / Indicators: Aching;Operative site guarding Pain Intervention(s): Monitored during session;Repositioned;Ice applied    Home Living                      Prior Function            PT Goals (current goals can now be found in the care plan section) Acute Rehab PT Goals Patient Stated Goal: to go home Potential to Achieve Goals: Good Progress towards PT goals:  Progressing toward goals    Frequency    7X/week      PT Plan Current plan remains appropriate    Co-evaluation              AM-PAC PT "6 Clicks" Mobility   Outcome Measure  Help needed turning from your back to your side while in a flat bed without using bedrails?: A Little Help needed moving from lying on your back to sitting on the side of a flat bed without using bedrails?: A Little Help needed moving to and from a bed to a chair (including a wheelchair)?: A Little Help needed standing up from a chair using your arms (e.g., wheelchair or  bedside chair)?: A Little Help needed to walk in hospital room?: A Little Help needed climbing 3-5 steps with a railing? : A Little 6 Click Score: 18    End of Session Equipment Utilized During Treatment: Gait belt Activity Tolerance: Patient limited by pain Patient left: in chair;with call bell/phone within reach;with family/visitor present;with nursing/sitter in room Nurse Communication: Mobility status PT Visit Diagnosis: Other abnormalities of gait and mobility (R26.89);Muscle weakness (generalized) (M62.81);Difficulty in walking, not elsewhere classified (R26.2);Pain Pain - Right/Left: Left Pain - part of body: Hip     Time: 6811-5726 PT Time Calculation (min) (ACUTE ONLY): 23 min  Charges:  $Gait Training: 8-22 mins $Therapeutic Exercise: 8-22 mins                     Governor Rooks, PTA Acute Rehabilitation Services Pager [phone] Office [phone]     Lucilla Petrenko Eli Hose 11/18/2018, 11:55 AM

## 2018-11-18 NOTE — Anesthesia Procedure Notes (Signed)
Spinal  Patient location during procedure: OR Start time: 11/16/2018 9:51 AM End time: 11/16/2018 9:56 AM Staffing Anesthesiologist: Lynda Rainwater, MD Performed: anesthesiologist  Preanesthetic Checklist Completed: patient identified, site marked, surgical consent, pre-op evaluation, timeout performed, IV checked, risks and benefits discussed and monitors and equipment checked Spinal Block Patient position: sitting Prep: DuraPrep Patient monitoring: heart rate, cardiac monitor, continuous pulse ox and blood pressure Approach: midline Location: L3-4 Injection technique: single-shot Needle Needle type: Sprotte  Needle gauge: 24 G Needle length: 9 cm Assessment Sensory level: T4

## 2018-11-18 NOTE — Progress Notes (Addendum)
Occupational Therapy Treatment Patient Details Name: Brittany [name] MRN: [id number] DOB: [birth year] Today's Date: 11/18/2018    History of present illness Pt is a 73 y/o female s/p L THA. 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    OT comments  Pt progressing towards OT goals, presents seated in recliner pleasant and willing to participate in therapies. Pt completing room level mobility using RW with minA; overall able to maintain TDWB status; pt mostly with tendency to maintain NWB in LLE with mobility in room. Pt demonstrating toileting and standing grooming ADL with minA for balance and safety. Further discussion/education held regarding plans for home and safety with performing ADL including bathing ADL. Discussed option of performing sponge bath at sink level initially to increase pt safety and reduce risk for falls, pt in agreement. Pt progressing well but do recommend she have 24hr hands on assist/supervision at time of discharge to ensure her safety with ADL and mobility at time of discharge. Will follow.   Follow Up Recommendations  Follow surgeon's recommendation for DC plan and follow-up therapies;Supervision/Assistance - 24 hour;Home health OT    Equipment Recommendations  3 in 1 bedside commode(for use in shower)          Precautions / Restrictions Precautions Precautions: Fall Restrictions Weight Bearing Restrictions: Yes LLE Weight Bearing: Touchdown weight bearing       Mobility Bed Mobility Overal bed mobility: Needs Assistance Bed Mobility: Sit to Supine     Supine to sit: Min assist     General bed mobility comments: light assist for LLE onto EOB  Transfers Overall transfer level: Needs assistance Equipment used: Rolling walker (2 wheeled) Transfers: Sit to/from Stand Sit to Stand: Min assist         General transfer comment: Cues for hand placement and forward weight shifting.  Pt is slow and gaurded required  reminders for weight bearing status.    Balance Overall balance assessment: Needs assistance Sitting-balance support: No upper extremity supported;Feet supported Sitting balance-Leahy Scale: Fair     Standing balance support: Bilateral upper extremity supported;During functional activity Standing balance-Leahy Scale: Poor Standing balance comment: Reliant on UE and external support                           ADL either performed or assessed with clinical judgement   ADL Overall ADL's : Needs assistance/impaired     Grooming: Wash/dry hands;Minimal assistance;Standing                   Toilet Transfer: Minimal assistance;Ambulation;BSC;RW Toilet Transfer Details (indicate cue type and reason): BSC over toilet  Toileting- Clothing Manipulation and Hygiene: Minimal assistance;Sit to/from stand Toileting - Clothing Manipulation Details (indicate cue type and reason): assist for standing balance while pt performs peri-care in standing   Tub/Shower Transfer Details (indicate cue type and reason): pt reporting recent fall was while she was in the shower; given TDWB status and need for physical assist with mobility discussed with pt performing sponge bathing initially from sit<>stand level at sink; pt verbalizing agreement Functional mobility during ADLs: Minimal assistance;Rolling walker                         Cognition Arousal/Alertness: Awake/alert Behavior During Therapy: WFL for tasks assessed/performed Overall Cognitive Status: Within Functional Limits for tasks assessed  General Comments: for basic functional tasks, may benefit from higher level cognitive assessment         Exercises Total Joint Exercises Ankle Circles/Pumps: AROM;Both;20 reps;Supine Quad Sets: AROM;Left;10 reps;Supine Heel Slides: Left;10 reps;AAROM;Supine Hip ABduction/ADduction: AAROM;Left;10 reps;Supine Straight Leg Raises:  AAROM;Left;10 reps;Supine   Shoulder Instructions       General Comments      Pertinent Vitals/ Pain       Pain Assessment: 0-10 Pain Score: 8 (faces indicate lesser pain level) Pain Location: L hip Pain Descriptors / Indicators: Aching;Operative site guarding Pain Intervention(s): Limited activity within patient's tolerance;Monitored during session;Repositioned;Ice applied(pt declined pain meds at this time)  Home Living                                          Prior Functioning/Environment              Frequency  Min 2X/week        Progress Toward Goals  OT Goals(current goals can now be found in the care plan section)  Progress towards OT goals: Progressing toward goals  Acute Rehab OT Goals Patient Stated Goal: to go home OT Goal Formulation: With patient Time For Goal Achievement: 11/24/18 Potential to Achieve Goals: Good ADL Goals Pt Will Perform Grooming: with set-up;sitting Pt Will Perform Upper Body Bathing: with set-up;sitting Pt Will Perform Lower Body Bathing: with min assist;sitting/lateral leans;with caregiver independent in assisting Pt Will Perform Upper Body Dressing: with set-up;sitting Pt Will Perform Lower Body Dressing: with mod assist;sitting/lateral leans;sit to/from stand;with adaptive equipment;with caregiver independent in assisting;with min assist Pt Will Transfer to Toilet: with min assist;with min guard assist;ambulating;regular height toilet;bedside commode;grab bars Pt Will Perform Toileting - Clothing Manipulation and hygiene: with min assist;sit to/from stand  Plan Discharge plan remains appropriate    Co-evaluation                 AM-PAC OT "6 Clicks" Daily Activity     Outcome Measure   Help from another person eating meals?: None Help from another person taking care of personal grooming?: A Little Help from another person toileting, which includes using toliet, bedpan, or urinal?: A Little Help  from another person bathing (including washing, rinsing, drying)?: A Lot Help from another person to put on and taking off regular upper body clothing?: None Help from another person to put on and taking off regular lower body clothing?: A Lot 6 Click Score: 18    End of Session Equipment Utilized During Treatment: Gait belt;Rolling walker  OT Visit Diagnosis: Unsteadiness on feet (R26.81);Other abnormalities of gait and mobility (R26.89);Muscle weakness (generalized) (M62.81);Pain Pain - Right/Left: Left Pain - part of body: Hip   Activity Tolerance Patient tolerated treatment well   Patient Left in bed;with call bell/phone within reach   Nurse Communication Mobility status        Time: 7035-0093 OT Time Calculation (min): 20 min  Charges: OT General Charges $OT Visit: 1 Visit OT Treatments $Self Care/Home Management : 8-22 mins  Brittany [name], [place] Pager [phone] Office [phone]    Raymondo Band 11/18/2018, 2:24 PM

## 2018-11-19 DIAGNOSIS — M160 Bilateral primary osteoarthritis of hip: Secondary | ICD-10-CM | POA: Diagnosis not present

## 2018-11-19 LAB — GLUCOSE, CAPILLARY
Glucose-Capillary: 105 mg/dL — ABNORMAL HIGH (ref 70–99)
Glucose-Capillary: 127 mg/dL — ABNORMAL HIGH (ref 70–99)
Glucose-Capillary: 120 mg/dL — ABNORMAL HIGH (ref 70–99)

## 2018-11-19 NOTE — Progress Notes (Signed)
   Subjective:  Patient reports pain as mild.    Objective:   VITALS:   Vitals:   11/18/18 1533 11/18/18 1933 11/19/18 0342 11/19/18 0621  BP: (!) 106/51 (!) 112/53 (!) 113/35 118/64  Pulse: 79 78 74   Resp: 16 16 16    Temp: 98.9 F (37.2 C) 98.9 F (37.2 C) 98.6 F (37 C)   TempSrc: Oral Oral    SpO2: 96% 98% 97%   Weight:      Height:        Neurologically intact Neurovascular intact Sensation intact distally Intact pulses distally Dorsiflexion/Plantar flexion intact Incision: dressing C/D/I and no drainage No cellulitis present Compartment soft   Lab Results  Component Value Date   WBC 14.0 (H) 11/18/2018   HGB 9.6 (L) 11/18/2018   HCT 28.8 (L) 11/18/2018   MCV 86.2 11/18/2018   PLT 164 11/18/2018     Assessment/Plan:  3 Days Post-Op   - progressing well with PT, stair training this morning - plan for d/c home today with son   Eduard Roux 11/19/2018, 7:51 AM [phone]

## 2018-11-19 NOTE — Plan of Care (Signed)

## 2018-11-19 NOTE — Progress Notes (Signed)
Physical Therapy Treatment Patient Details Name: Brittany [name] MRN: [id number] DOB: 1945-05-13 Today's Date: 11/19/2018    History of Present Illness Pt is a 73 y/o female s/p L THA. 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     PT Comments    Pt performed gait training and functional mobility during session this am.  Pt negotiated x1 stair with min assistance.  Plan for d/c home with support from her son.     Follow Up Recommendations  Follow surgeon's recommendation for DC plan and follow-up therapies;Supervision for mobility/OOB     Equipment Recommendations  None recommended by PT    Recommendations for Other Services OT consult     Precautions / Restrictions Precautions Precautions: Fall Restrictions Weight Bearing Restrictions: Yes LLE Weight Bearing: Touchdown weight bearing    Mobility  Bed Mobility               General bed mobility comments: Pt seated in recliner on arrival.  Transfers Overall transfer level: Needs assistance Equipment used: Rolling walker (2 wheeled) Transfers: Sit to/from Stand Sit to Stand: Min guard         General transfer comment: Cues for hand placement and forward weight shifting.  Pt is slow and gaurded required reminders for weight bearing status.  Pt performed transfer from chair and bedside commode.  Ambulation/Gait Ambulation/Gait assistance: Min assist Gait Distance (Feet): 20 Feet Assistive device: Rolling walker (2 wheeled) Gait Pattern/deviations: Step-to pattern;Narrow base of support;Trunk flexed(hop to pattern)     General Gait Details: Cues for safety with hop to pattern.  Ambulates as NWB and rests in TWB.  Pt is progressing well.   Stairs Stairs: Yes Stairs assistance: Min assist Stair Management: Backwards;No rails Number of Stairs: 1 General stair comments: Cues for sequencing and placement of RW.  Pt practiced bring RW up to stair after negotiating  stair.   Wheelchair Mobility    Modified Rankin (Stroke Patients Only)       Balance Overall balance assessment: Needs assistance Sitting-balance support: No upper extremity supported;Feet supported Sitting balance-Leahy Scale: Fair     Standing balance support: Bilateral upper extremity supported;During functional activity Standing balance-Leahy Scale: Poor                              Cognition Arousal/Alertness: Awake/alert Behavior During Therapy: WFL for tasks assessed/performed Overall Cognitive Status: Within Functional Limits for tasks assessed                                        Exercises      General Comments        Pertinent Vitals/Pain Pain Assessment: 0-10 Pain Score: 3  Pain Location: L hip Pain Descriptors / Indicators: Aching;Operative site guarding Pain Intervention(s): Monitored during session;Repositioned;Ice applied    Home Living                      Prior Function            PT Goals (current goals can now be found in the care plan section) Acute Rehab PT Goals Patient Stated Goal: to go home Potential to Achieve Goals: Good    Frequency    7X/week      PT Plan Current plan remains appropriate    Co-evaluation  AM-PAC PT "6 Clicks" Mobility   Outcome Measure  Help needed turning from your back to your side while in a flat bed without using bedrails?: A Little Help needed moving from lying on your back to sitting on the side of a flat bed without using bedrails?: A Little Help needed moving to and from a bed to a chair (including a wheelchair)?: A Little Help needed standing up from a chair using your arms (e.g., wheelchair or bedside chair)?: A Little Help needed to walk in hospital room?: A Little Help needed climbing 3-5 steps with a railing? : A Little 6 Click Score: 18    End of Session Equipment Utilized During Treatment: Gait belt Activity Tolerance:  Patient limited by pain Patient left: in chair;with call bell/phone within reach;with family/visitor present;with nursing/sitter in room Nurse Communication: Mobility status PT Visit Diagnosis: Other abnormalities of gait and mobility (R26.89);Muscle weakness (generalized) (M62.81);Difficulty in walking, not elsewhere classified (R26.2);Pain Pain - Right/Left: Left Pain - part of body: Hip     Time: 0737-1062 PT Time Calculation (min) (ACUTE ONLY): 32 min  Charges:  $Gait Training: 8-22 mins $Therapeutic Activity: 8-22 mins                     Governor Rooks, PTA Acute Rehabilitation Services Pager [phone] Office [phone]     Kynzee Devinney Eli Hose 11/19/2018, 12:49 PM

## 2018-11-19 NOTE — Progress Notes (Signed)
Physical Therapy Treatment Patient Details Name: Brittany [name] MRN: [id number] DOB: 1945-11-03 Today's Date: 11/19/2018    History of Present Illness Pt is a 73 y/o female s/p L THA. 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     PT Comments    Pt performed gt training and functional mobility.  Pt performed HEP both supine and standing exercises.  Pt is slow and guarded and continues to fatigue quickly.  Pt to d/c home today with support from her son.  HEP issued and reviewed for accuracy.   Follow Up Recommendations  Follow surgeon's recommendation for DC plan and follow-up therapies;Supervision for mobility/OOB     Equipment Recommendations  None recommended by PT    Recommendations for Other Services OT consult     Precautions / Restrictions Precautions Precautions: Fall Restrictions Weight Bearing Restrictions: Yes LLE Weight Bearing: Touchdown weight bearing    Mobility  Bed Mobility Overal bed mobility: Needs Assistance Bed Mobility: Sit to Supine       Sit to supine: Min assist   General bed mobility comments: Min assistance for LLE to achieve back to bed.  Pt able to boost in supine with supervision.  Transfers Overall transfer level: Needs assistance Equipment used: Rolling walker (2 wheeled) Transfers: Sit to/from Stand Sit to Stand: Min guard         General transfer comment: Cues for hand placement and weight bearing.  Ambulation/Gait Ambulation/Gait assistance: Min guard Gait Distance (Feet): 20 Feet Assistive device: Rolling walker (2 wheeled) Gait Pattern/deviations: Step-to pattern;Narrow base of support;Trunk flexed(hop to)     General Gait Details: Cues for safety with hop to pattern.  Ambulates as NWB and rests in TWB.  Pt is progressing well.  Cues for upper trunk control.   Stairs Stairs: Yes Stairs assistance: Min assist Stair Management: Backwards;No rails Number of Stairs: 1 General stair  comments: Cues for sequencing and placement of RW.  Pt practiced bring RW up to stair after negotiating stair.   Wheelchair Mobility    Modified Rankin (Stroke Patients Only)       Balance Overall balance assessment: Needs assistance Sitting-balance support: No upper extremity supported;Feet supported Sitting balance-Leahy Scale: Fair     Standing balance support: Bilateral upper extremity supported;During functional activity Standing balance-Leahy Scale: Poor Standing balance comment: Reliant on UE and external support                            Cognition Arousal/Alertness: Awake/alert Behavior During Therapy: WFL for tasks assessed/performed Overall Cognitive Status: Within Functional Limits for tasks assessed                                        Exercises Total Joint Exercises Ankle Circles/Pumps: AROM;Both;20 reps;Supine Quad Sets: AROM;Left;10 reps;Supine Heel Slides: Left;10 reps;AAROM;Supine Hip ABduction/ADduction: AAROM;Left;10 reps;Supine Straight Leg Raises: AAROM;Left;10 reps;Supine Long Arc Quad: AROM;Left;10 reps;Seated Knee Flexion: AROM;Left;10 reps;Standing Marching in Standing: AROM;Left;10 reps;Standing Standing Hip Extension: AROM;10 reps;Left;Standing;AAROM(cues and tactile assistance to keep knee straight) General Exercises - Lower Extremity Hip ABduction/ADduction: AROM;Left;10 reps;Standing    General Comments        Pertinent Vitals/Pain Pain Assessment: 0-10 Pain Score: 4  Pain Location: L hip Pain Descriptors / Indicators: Aching;Operative site guarding Pain Intervention(s): Monitored during session;Repositioned;Ice applied    Home Living  Prior Function            PT Goals (current goals can now be found in the care plan section) Acute Rehab PT Goals Patient Stated Goal: to go home Potential to Achieve Goals: Good Progress towards PT goals: Progressing toward goals     Frequency    7X/week      PT Plan Current plan remains appropriate    Co-evaluation              AM-PAC PT "6 Clicks" Mobility   Outcome Measure  Help needed turning from your back to your side while in a flat bed without using bedrails?: A Little Help needed moving from lying on your back to sitting on the side of a flat bed without using bedrails?: A Little Help needed moving to and from a bed to a chair (including a wheelchair)?: A Little Help needed standing up from a chair using your arms (e.g., wheelchair or bedside chair)?: A Little Help needed to walk in hospital room?: A Little Help needed climbing 3-5 steps with a railing? : A Little 6 Click Score: 18    End of Session Equipment Utilized During Treatment: Gait belt Activity Tolerance: Patient limited by pain Patient left: in chair;with call bell/phone within reach;with family/visitor present;with nursing/sitter in room Nurse Communication: Mobility status PT Visit Diagnosis: Other abnormalities of gait and mobility (R26.89);Muscle weakness (generalized) (M62.81);Difficulty in walking, not elsewhere classified (R26.2);Pain Pain - Right/Left: Left Pain - part of body: Hip     Time: 1341-1414 PT Time Calculation (min) (ACUTE ONLY): 33 min  Charges:  $Gait Training: 8-22 mins $Therapeutic Exercise: 8-22 mins                      Governor Rooks, PTA Acute Rehabilitation Services Pager [phone] Office [place] Rapheal Masso 11/19/2018, 2:22 PM

## 2018-11-19 NOTE — Discharge Summary (Signed)
Physician Discharge Summary      Patient ID: Brittany [name] MRN: [id number] DOB/AGE: 1945/07/02 73 y.o.  Admit date: 11/16/2018 Discharge date: 11/19/2018  Admission Diagnoses:  Primary osteoarthritis of left hip  Discharge Diagnoses:  Principal Problem:   Primary osteoarthritis of left hip Active Problems:   Status post total replacement of left hip   Past Medical History:  Diagnosis Date  . Anemia    as a younger woman  . Arthritis    "all over my body" (10/01/2017)  . Chronic pain    In pain clinic   . Diabetic peripheral neuropathy ([place])   . GERD (gastroesophageal reflux disease)   . High cholesterol   . History of hiatal hernia   . Hypertension   . Type II diabetes mellitus ([place]) dx'd ~ 2008    Surgeries: Procedure(s): LEFT TOTAL HIP ARTHROPLASTY ANTERIOR APPROACH on 11/16/2018   Consultants (if any):   Discharged Condition: Improved  Hospital Course: Brittany [name] is an 73 y.o. female who was admitted 11/16/2018 with a diagnosis of Primary osteoarthritis of left hip and went to the operating room on 11/16/2018 and underwent the above named procedures.    She was given perioperative antibiotics:  Anti-infectives (From admission, onward)   Start     Dose/Rate Route Frequency Ordered Stop   11/16/18 1600  ceFAZolin (ANCEF) IVPB 2g/100 mL premix     2 g 200 mL/hr over 30 Minutes Intravenous Every 6 hours 11/16/18 1406 11/17/18 0601   11/16/18 0948  vancomycin (VANCOCIN) powder  Status:  Discontinued       As needed 11/16/18 0948 11/16/18 1224   11/16/18 0930  ceFAZolin (ANCEF) IVPB 2g/100 mL premix     2 g 200 mL/hr over 30 Minutes Intravenous To [facility] 11/13/18 1150 11/16/18 0954   11/16/18 0000  cephALEXin (KEFLEX) 500 MG capsule     500 mg Oral 4 times daily 11/16/18 0900      .  She was given sequential compression devices, early ambulation, and appropriate chemoprophylaxis for DVT prophylaxis.  She benefited maximally from the hospital  stay and there were no complications.    Recent vital signs:  Vitals:   11/19/18 0342 11/19/18 0621  BP: (!) 113/35 118/64  Pulse: 74   Resp: 16   Temp: 98.6 F (37 C)   SpO2: 97%     Recent laboratory studies:  Lab Results  Component Value Date   HGB 9.6 (L) 11/18/2018   HGB 9.9 (L) 11/17/2018   HGB 13.0 11/12/2018   Lab Results  Component Value Date   WBC 14.0 (H) 11/18/2018   PLT 164 11/18/2018   Lab Results  Component Value Date   INR 1.0 11/12/2018   Lab Results  Component Value Date   NA 140 11/18/2018   K 3.7 11/18/2018   CL 103 11/18/2018   CO2 29 11/18/2018   BUN 8 11/18/2018   CREATININE 0.64 11/18/2018   GLUCOSE 129 (H) 11/18/2018    Discharge Medications:   Allergies as of 11/19/2018      Reactions   Sulfonamide Derivatives Hives      Medication List    STOP taking these medications   aspirin 81 MG tablet Replaced by: aspirin EC 81 MG tablet     TAKE these medications   amLODipine 5 MG tablet Commonly known as: NORVASC TAKE 1 TABLET DAILY   aspirin EC 81 MG tablet Take 1 tablet (81 mg total) by mouth 2 (two) times daily. Replaces:  aspirin 81 MG tablet   atorvastatin 10 MG tablet Commonly known as: LIPITOR Take 1 tablet (10 mg total) by mouth daily.   Biotin Plus Keratin 10000-100 MCG-MG Tabs Take 1 tablet by mouth daily.   bisoprolol-hydrochlorothiazide 10-6.25 MG tablet Commonly known as: ZIAC Take 1 tablet by mouth daily.   cephALEXin 500 MG capsule Commonly known as: KEFLEX Take 1 capsule (500 mg total) by mouth 4 (four) times daily.   docusate sodium 100 MG capsule Commonly known as: COLACE Take 100 mg by mouth 2 (two) times daily.   doxepin 10 MG capsule Commonly known as: SINEQUAN Take 10 mg by mouth at bedtime as needed (for sleep).   escitalopram 10 MG tablet Commonly known as: LEXAPRO TAKE 1 TABLET DAILY   estradiol 1 MG tablet Commonly known as: ESTRACE Take 1 tablet (1 mg total) by mouth daily.    ferrous sulfate 325 (65 FE) MG tablet Take 325 mg by mouth daily with breakfast.   Fish Oil 1000 MG Caps Take 1,000 mg by mouth daily.   folic acid 614 MCG tablet Commonly known as: FOLVITE Take 800 mcg by mouth daily.   furosemide 20 MG tablet Commonly known as: LASIX Take 1 tablet (20 mg total) by mouth daily.   gabapentin 800 MG tablet Commonly known as: NEURONTIN Take 1 tablet (800 mg total) by mouth 4 (four) times daily.   glucose blood test strip Commonly known as: ONE TOUCH ULTRA TEST Check Blood sugar once daily   Klor-Con M20 20 MEQ tablet Generic drug: potassium chloride SA TAKE 1 TABLET DAILY What changed: how much to take   methocarbamol 750 MG tablet Commonly known as: ROBAXIN Take 1 tablet (750 mg total) by mouth 2 (two) times daily as needed for muscle spasms.   morphine 60 MG 12 hr tablet Commonly known as: MS CONTIN Take 60 mg by mouth every 12 (twelve) hours.   MULTIVITAMIN GUMMIES WOMENS PO Take 1 tablet by mouth daily.   omeprazole 40 MG capsule Commonly known as: PRILOSEC TAKE 1 CAPSULE DAILY   ondansetron 4 MG tablet Commonly known as: ZOFRAN Take 1-2 tablets (4-8 mg total) by mouth every 8 (eight) hours as needed for nausea or vomiting.   ONE TOUCH ULTRA MINI w/Device Kit Use as directed once a day.  Dx Code:  E31.54   OneTouch Delica Lancets 00Q Misc Check blood sugar once daily   oxyCODONE 5 MG immediate release tablet Commonly known as: Oxy IR/ROXICODONE Take 1-2 tablets (5-10 mg total) by mouth every 8 (eight) hours as needed for severe pain.   oxyCODONE 10 mg 12 hr tablet Commonly known as: OXYCONTIN Take 1 tablet (10 mg total) by mouth every 12 (twelve) hours for 3 days.   promethazine 25 MG tablet Commonly known as: PHENERGAN Take 1 tablet (25 mg total) by mouth every 6 (six) hours as needed for nausea.   senna-docusate 8.6-50 MG tablet Commonly known as: Senokot S Take 1-2 tablets by mouth at bedtime as needed.    sitaGLIPtin-metformin 50-1000 MG tablet Commonly known as: Janumet Take 2 tablets po at bedtime daily What changed:   how much to take  how to take this  when to take this  additional instructions   tiZANidine 4 MG tablet Commonly known as: ZANAFLEX Take 4 mg by mouth 3 (three) times daily.            Durable Medical Equipment  (From admission, onward)         Start  Ordered   11/16/18 1407  DME Walker rolling  Once    Question:  Patient needs a walker to treat with the following condition  Answer:  History of hip replacement   11/16/18 1406   11/16/18 1407  DME 3 n 1  Once     11/16/18 1406   11/16/18 1407  DME Bedside commode  Once    Question:  Patient needs a bedside commode to treat with the following condition  Answer:  History of hip replacement   11/16/18 1406          Diagnostic Studies: Dg Chest 2 View  Result Date: 11/12/2018 CLINICAL DATA:  Preop EXAM: CHEST - 2 VIEW COMPARISON:  May 15, 2017 FINDINGS: The heart size and mediastinal contours are within normal limits. Both lungs are clear. Degenerative changes seen in the midthoracic spine. No acute osseous findings. IMPRESSION: No acute cardiopulmonary disease. Electronically Signed   By: Prudencio Pair M.D.   On: 11/12/2018 17:14   Dg Pelvis Portable  Result Date: 11/16/2018 CLINICAL DATA:  Status post left hip replacement. EXAM: PORTABLE PELVIS 1-2 VIEWS COMPARISON:  None. FINDINGS: The left femoral and acetabular components appear to be well situated. No fracture or dislocation is noted. Mild degenerative changes seen involving the right hip joint. IMPRESSION: Status post left hip arthroplasty.  No acute abnormality is noted. Electronically Signed   By: Marijo Conception M.D.   On: 11/16/2018 14:05   Dg C-arm 1-60 Min  Result Date: 11/16/2018 CLINICAL DATA:  Left hip replacement. EXAM: OPERATIVE LEFT HIP (WITH PELVIS IF PERFORMED) 7 VIEWS TECHNIQUE: Fluoroscopic spot image(s) were submitted for  interpretation post-operatively. FLUOROSCOPY TIME:  Fluoroscopy Time: 1 minute 44 seconds Radiation Exposure Index: 11.001 mGy COMPARISON:  Left hip radiographs 06/10/2018 FINDINGS: Seven intraoperative fluoroscopic images are provided with the initial images again demonstrating advanced left hip osteoarthrosis. Subsequent images demonstrate performance of a left total hip arthroplasty with the prosthetic femoral and acetabular components appearing normally positioned on the final images. No acute fracture is identified on these limited fluoroscopic images. IMPRESSION: Intraoperative images during left total hip arthroplasty. Electronically Signed   By: Logan Bores M.D.   On: 11/16/2018 12:57   Dg Hip Operative Unilat With Pelvis Left  Result Date: 11/16/2018 CLINICAL DATA:  Left hip replacement. EXAM: OPERATIVE LEFT HIP (WITH PELVIS IF PERFORMED) 7 VIEWS TECHNIQUE: Fluoroscopic spot image(s) were submitted for interpretation post-operatively. FLUOROSCOPY TIME:  Fluoroscopy Time: 1 minute 44 seconds Radiation Exposure Index: 11.001 mGy COMPARISON:  Left hip radiographs 06/10/2018 FINDINGS: Seven intraoperative fluoroscopic images are provided with the initial images again demonstrating advanced left hip osteoarthrosis. Subsequent images demonstrate performance of a left total hip arthroplasty with the prosthetic femoral and acetabular components appearing normally positioned on the final images. No acute fracture is identified on these limited fluoroscopic images. IMPRESSION: Intraoperative images during left total hip arthroplasty. Electronically Signed   By: Logan Bores M.D.   On: 11/16/2018 12:57    Disposition: Discharge disposition: 01-Home or Self Care       Discharge Instructions    Call MD / Call 911   Complete by: As directed    If you experience chest pain or shortness of breath, CALL 911 and be transported to the hospital emergency room.  If you develope a fever above 101.5 F, pus  (white drainage) or increased drainage or redness at the wound, or calf pain, call your surgeon's office.   Constipation Prevention   Complete by: As directed  Drink plenty of fluids.  Prune juice may be helpful.  You may use a stool softener, such as Colace (over the counter) 100 mg twice a day.  Use MiraLax (over the counter) for constipation as needed.   Driving restrictions   Complete by: As directed    No driving while taking narcotic pain meds.   Increase activity slowly as tolerated   Complete by: As directed       Follow-up Information    Leandrew Koyanagi, MD In 2 weeks.   Specialty: Orthopedic Surgery Why: For suture removal, For wound re-check Contact information: [place] [address] [phone]            Signed: Eduard Roux 11/19/2018, 7:55 AM

## 2018-11-20 ENCOUNTER — Telehealth: Payer: Self-pay | Admitting: *Deleted

## 2018-11-20 NOTE — Telephone Encounter (Signed)
Patient was discharged from hospital after surgery for Left total hip arthroplasty. Discharge instructions state for her to follow up with orthopedic surgeon Dr. Erlinda Hong.

## 2018-11-21 NOTE — Care Management (Addendum)
11/21/18 1521: Case manager received notification via email that [facility] can not provide [facility] for patient d/t being out of network. CM called Cory with [facility] and they will accept patient.Start of Care will be Monday 11/23/18. CM will contact patient and update her. CM left voice message on patient's home phone [phone].    Ricki Miller, RN BSN Case Manager [phone]

## 2018-11-23 ENCOUNTER — Other Ambulatory Visit: Payer: Self-pay | Admitting: Family Medicine

## 2018-11-23 DIAGNOSIS — K219 Gastro-esophageal reflux disease without esophagitis: Secondary | ICD-10-CM | POA: Diagnosis not present

## 2018-11-23 DIAGNOSIS — M47814 Spondylosis without myelopathy or radiculopathy, thoracic region: Secondary | ICD-10-CM | POA: Diagnosis not present

## 2018-11-23 DIAGNOSIS — E785 Hyperlipidemia, unspecified: Secondary | ICD-10-CM

## 2018-11-23 DIAGNOSIS — G8929 Other chronic pain: Secondary | ICD-10-CM | POA: Diagnosis not present

## 2018-11-23 DIAGNOSIS — K449 Diaphragmatic hernia without obstruction or gangrene: Secondary | ICD-10-CM | POA: Diagnosis not present

## 2018-11-23 DIAGNOSIS — I10 Essential (primary) hypertension: Secondary | ICD-10-CM | POA: Diagnosis not present

## 2018-11-23 DIAGNOSIS — E1169 Type 2 diabetes mellitus with other specified complication: Secondary | ICD-10-CM

## 2018-11-23 DIAGNOSIS — E1142 Type 2 diabetes mellitus with diabetic polyneuropathy: Secondary | ICD-10-CM | POA: Diagnosis not present

## 2018-11-23 DIAGNOSIS — E7800 Pure hypercholesterolemia, unspecified: Secondary | ICD-10-CM | POA: Diagnosis not present

## 2018-11-23 DIAGNOSIS — Z471 Aftercare following joint replacement surgery: Secondary | ICD-10-CM | POA: Diagnosis not present

## 2018-11-23 DIAGNOSIS — D649 Anemia, unspecified: Secondary | ICD-10-CM | POA: Diagnosis not present

## 2018-11-24 DIAGNOSIS — M47814 Spondylosis without myelopathy or radiculopathy, thoracic region: Secondary | ICD-10-CM | POA: Diagnosis not present

## 2018-11-24 DIAGNOSIS — G8929 Other chronic pain: Secondary | ICD-10-CM | POA: Diagnosis not present

## 2018-11-24 DIAGNOSIS — K219 Gastro-esophageal reflux disease without esophagitis: Secondary | ICD-10-CM | POA: Diagnosis not present

## 2018-11-24 DIAGNOSIS — Z471 Aftercare following joint replacement surgery: Secondary | ICD-10-CM | POA: Diagnosis not present

## 2018-11-24 DIAGNOSIS — E1142 Type 2 diabetes mellitus with diabetic polyneuropathy: Secondary | ICD-10-CM | POA: Diagnosis not present

## 2018-11-24 DIAGNOSIS — E785 Hyperlipidemia, unspecified: Secondary | ICD-10-CM | POA: Diagnosis not present

## 2018-11-24 DIAGNOSIS — D649 Anemia, unspecified: Secondary | ICD-10-CM | POA: Diagnosis not present

## 2018-11-24 DIAGNOSIS — I10 Essential (primary) hypertension: Secondary | ICD-10-CM | POA: Diagnosis not present

## 2018-11-24 DIAGNOSIS — K449 Diaphragmatic hernia without obstruction or gangrene: Secondary | ICD-10-CM | POA: Diagnosis not present

## 2018-11-24 DIAGNOSIS — E7800 Pure hypercholesterolemia, unspecified: Secondary | ICD-10-CM | POA: Diagnosis not present

## 2018-11-25 ENCOUNTER — Telehealth: Payer: Self-pay | Admitting: Orthopaedic Surgery

## 2018-11-25 DIAGNOSIS — I10 Essential (primary) hypertension: Secondary | ICD-10-CM | POA: Diagnosis not present

## 2018-11-25 DIAGNOSIS — K449 Diaphragmatic hernia without obstruction or gangrene: Secondary | ICD-10-CM | POA: Diagnosis not present

## 2018-11-25 DIAGNOSIS — Z471 Aftercare following joint replacement surgery: Secondary | ICD-10-CM | POA: Diagnosis not present

## 2018-11-25 DIAGNOSIS — E785 Hyperlipidemia, unspecified: Secondary | ICD-10-CM | POA: Diagnosis not present

## 2018-11-25 DIAGNOSIS — G8929 Other chronic pain: Secondary | ICD-10-CM | POA: Diagnosis not present

## 2018-11-25 DIAGNOSIS — E7800 Pure hypercholesterolemia, unspecified: Secondary | ICD-10-CM | POA: Diagnosis not present

## 2018-11-25 DIAGNOSIS — K219 Gastro-esophageal reflux disease without esophagitis: Secondary | ICD-10-CM | POA: Diagnosis not present

## 2018-11-25 DIAGNOSIS — E1142 Type 2 diabetes mellitus with diabetic polyneuropathy: Secondary | ICD-10-CM | POA: Diagnosis not present

## 2018-11-25 DIAGNOSIS — M47814 Spondylosis without myelopathy or radiculopathy, thoracic region: Secondary | ICD-10-CM | POA: Diagnosis not present

## 2018-11-25 DIAGNOSIS — D649 Anemia, unspecified: Secondary | ICD-10-CM | POA: Diagnosis not present

## 2018-11-25 NOTE — Telephone Encounter (Signed)
Tommi Emery with [facility] called to confirm touch down weight bearing status and also any hip precautions.  EL#[phone].  Thank you.

## 2018-11-26 ENCOUNTER — Telehealth: Payer: Self-pay | Admitting: Orthopaedic Surgery

## 2018-11-26 NOTE — Telephone Encounter (Signed)
IC LMVM advising Brittany [name]

## 2018-11-26 NOTE — Telephone Encounter (Signed)
TDWB, otherwise, No hip precautions.

## 2018-11-26 NOTE — Telephone Encounter (Signed)
Please advise. Thanks.  

## 2018-11-26 NOTE — Telephone Encounter (Signed)
IC verbal given.  

## 2018-11-26 NOTE — Telephone Encounter (Signed)
[facility]/OT called for verbal order 1x week2 0x week1 1x week for 1 week  For ADLIDAL transfer and ecercise  Please call Timmothy Sours [phone]

## 2018-11-26 NOTE — Telephone Encounter (Signed)
Any other restrictions other than TDWB?

## 2018-11-26 NOTE — Telephone Encounter (Signed)
No hip precautions.  TDWB

## 2018-11-30 DIAGNOSIS — T8189XA Other complications of procedures, not elsewhere classified, initial encounter: Secondary | ICD-10-CM | POA: Diagnosis not present

## 2018-12-01 ENCOUNTER — Ambulatory Visit (HOSPITAL_COMMUNITY)
Admission: RE | Admit: 2018-12-01 | Discharge: 2018-12-01 | Disposition: A | Discharge status: Home / Self Care | Payer: Medicare HMO | Source: Ambulatory Visit | Attending: Physician Assistant | Admitting: Physician Assistant

## 2018-12-01 ENCOUNTER — Encounter: Payer: Self-pay | Admitting: Orthopaedic Surgery

## 2018-12-01 ENCOUNTER — Other Ambulatory Visit: Payer: Self-pay

## 2018-12-01 ENCOUNTER — Telehealth: Payer: Self-pay

## 2018-12-01 ENCOUNTER — Ambulatory Visit (INDEPENDENT_AMBULATORY_CARE_PROVIDER_SITE_OTHER): Payer: Medicare HMO | Admitting: Physician Assistant

## 2018-12-01 ENCOUNTER — Ambulatory Visit (INDEPENDENT_AMBULATORY_CARE_PROVIDER_SITE_OTHER): Payer: Medicare HMO

## 2018-12-01 DIAGNOSIS — Z96642 Presence of left artificial hip joint: Secondary | ICD-10-CM | POA: Diagnosis not present

## 2018-12-01 NOTE — Progress Notes (Signed)
Post-Op Visit Note   Patient: Brittany [name]           Date of Birth: November 11, 1945           MRN: [id number] Visit Date: 12/01/2018 PCP: Ann Held, DO   Assessment & Plan:  Chief Complaint:  Chief Complaint  Patient presents with  . Follow-up   Visit Diagnoses:  1. Status post total hip replacement, left     Plan: Patient is a pleasant 73 year old female who presents our clinic today 2 weeks status post left anterior total hip replacement, date of surgery 11/16/2018.  During operative intervention she sustained a nondisplaced posterior column acetabular fracture.  She has been somewhat compliant nonweightbearing but does admit to weightbearing on occasion.  She has moderate pain.  No fevers or chills.  No chest pain and no shortness of breath.  No personal or family history of DVT/PE.  She is a non-smoker no history of oral contraceptives.  Examination of her left hip reveals a well-healing surgical incision without evidence of infection or cellulitis.  She does have fairly significant swelling to the entire left lower extremity.  She has moderate tenderness to the calf with a positive Homan.  Decreased sensation distally, but this is baseline as she has neuropathy.  At this point, her x-rays show a well-seated prosthesis with stable alignment of the acetabular fracture.  We will allow her to progress to 50% weightbearing.  She will continue with home health physical therapy.  We will also obtain a venous Doppler ultrasound left lower extremity to rule out DVT.  Follow-up with [place] in 2 weeks time for repeat evaluation and AP pelvis lateral left hip x-rays.  Call with concerns or questions in the meantime.  Follow-Up Instructions: Return in about 2 weeks (around 12/15/2018).   Orders:  Orders Placed This Encounter  Procedures  . XR HIP UNILAT W OR W/O PELVIS 2-3 VIEWS LEFT  . VAS [place] LOWER EXTREMITY VENOUS (DVT)   No orders of the defined types were placed in this encounter.    Imaging: No results found.  PMFS History: Patient Active Problem List   Diagnosis Date Noted  . 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 Vaginal prolapse 09/30/2018  . Primary osteoarthritis of left hip 06/10/2018  . Osteomyelitis of second toe of right foot ([place]) 04/24/2018  . Osteomyelitis of right foot ([place]) 04/21/2018  . 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 Callus 01/13/2018  . Generalized anxiety disorder 10/16/2017  . Menopause 10/16/2017  . DM (diabetes mellitus) type II uncontrolled, periph vascular disorder ([place]) 10/16/2017  . Hyperlipidemia associated with type 2 diabetes mellitus ([place]) 10/16/2017  . 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 Osteomyelitis of great toe of right foot ([place]) 09/25/2017  . Pain in right foot 09/16/2017  . Cellulitis 08/08/2017  . Hypoglycemia without diagnosis of diabetes mellitus 08/08/2017  . Peripheral neuropathy 08/08/2017  . Abscess 08/08/2017  . Hyperlipidemia LDL goal <70 09/22/2016  . Tooth pain 09/03/2012  . Edema 09/03/2012  . Supraclavicular fossa fullness 06/18/2012  . Hair loss 09/18/2010  . Fatigue 09/18/2010  . DYSPEPSIA&OTHER [facility] DISORDERS FUNCTION STOMACH 05/08/2010  . DIARRHEA 05/08/2010  . DIZZINESS 04/19/2010  . OTHER ACUTE SINUSITIS 02/28/2010  . NAUSEA 02/28/2010  . Pain in right ankle and joints of right foot 02/15/2010  . URI 07/09/2007  . GERD 02/12/2007  . LOW BACK PAIN  02/12/2007  . Hypertension 09/03/2006  . HOT FLASHES 09/03/2006  . INSOMNIA 09/03/2006   Past Medical History:  Diagnosis Date  . Anemia    as a younger woman  . Arthritis    "all over my body" (10/01/2017)  . Chronic pain    In pain clinic   . Diabetic peripheral neuropathy ([place])   . GERD (gastroesophageal reflux disease)   . High cholesterol   . History of hiatal hernia   . Hypertension   . Type II diabetes mellitus ([place]) dx'd ~ 2008    Family  History  Problem Relation Age of Onset  . Cancer Mother        breast  . Depression Mother        bipolar  . Breast cancer Mother 43  . Heart disease Father 15       MI  . Diabetes Brother   . Kidney disease Brother   . Hypertension Brother   . Hyperlipidemia Brother   . Stroke Brother   . Coronary artery disease Other   . Diabetes Other     Past Surgical History:  Procedure Laterality Date  . AMPUTATION Right 09/29/2017   Procedure: AMPUTATION RIGHT 1ST RAY;  Surgeon: Leandrew Koyanagi, MD;  Location: [facility];  Service: Orthopedics;  Laterality: Right;  . AMPUTATION TOE Right 04/24/2018   Procedure: RIGHT 2ND TOE PROXIMAL INTERPHALANGEAL JOINT DISARTICULATION;  Surgeon: Leandrew Koyanagi, MD;  Location: [facility];  Service: Orthopedics;  Laterality: Right;  . APPLICATION OF WOUND VAC Right 09/29/2017   "foot"  . BACK SURGERY    . COLONOSCOPY    . I&D EXTREMITY Right 08/10/2017   Procedure: IRRIGATION AND DEBRIDEMENT FOOT;  Surgeon: Leandrew Koyanagi, MD;  Location: [facility];  Service: Orthopedics;  Laterality: Right;  . [place]  . POSTERIOR LUMBAR FUSION  1996  . TOE SURGERY Right 1980s   bone removed "inbetween my toes"  . TOTAL HIP ARTHROPLASTY Left 11/16/2018   Procedure: LEFT TOTAL HIP ARTHROPLASTY ANTERIOR APPROACH;  Surgeon: Leandrew Koyanagi, MD;  Location: [facility];  Service: Orthopedics;  Laterality: Left;  [name] VAGINAL HYSTERECTOMY     Social History   Occupational History  . Not on file  Tobacco Use  . Smoking status: Never Smoker  . Smokeless tobacco: Never Used  Substance and Sexual Activity  . Alcohol use: Never    Frequency: Never  . Drug use: Never  . Sexual activity: Not Currently    Partners: Male

## 2018-12-01 NOTE — Telephone Encounter (Signed)
Diona Browner with [facility] called stating that patient is Negative for DVT, Left LE.

## 2018-12-01 NOTE — Progress Notes (Signed)
Left lower extremity venous duplex has been completed. Preliminary results can be found in CV Proc through chart review.  Results were given to April at [place] office.  12/01/18 4:58 PM Carlos Levering RVT

## 2018-12-02 ENCOUNTER — Telehealth: Payer: Self-pay | Admitting: Orthopaedic Surgery

## 2018-12-02 NOTE — Telephone Encounter (Signed)
Patient wants to know when can she take a shower.  CB#[phone].  Thank you.

## 2018-12-02 NOTE — Telephone Encounter (Signed)
Ok, thanks.

## 2018-12-02 NOTE — Telephone Encounter (Signed)
Noted  

## 2018-12-03 DIAGNOSIS — K219 Gastro-esophageal reflux disease without esophagitis: Secondary | ICD-10-CM | POA: Diagnosis not present

## 2018-12-03 DIAGNOSIS — K449 Diaphragmatic hernia without obstruction or gangrene: Secondary | ICD-10-CM | POA: Diagnosis not present

## 2018-12-03 DIAGNOSIS — E1142 Type 2 diabetes mellitus with diabetic polyneuropathy: Secondary | ICD-10-CM | POA: Diagnosis not present

## 2018-12-03 DIAGNOSIS — E785 Hyperlipidemia, unspecified: Secondary | ICD-10-CM | POA: Diagnosis not present

## 2018-12-03 DIAGNOSIS — G8929 Other chronic pain: Secondary | ICD-10-CM | POA: Diagnosis not present

## 2018-12-03 DIAGNOSIS — D649 Anemia, unspecified: Secondary | ICD-10-CM | POA: Diagnosis not present

## 2018-12-03 DIAGNOSIS — E7800 Pure hypercholesterolemia, unspecified: Secondary | ICD-10-CM | POA: Diagnosis not present

## 2018-12-03 DIAGNOSIS — M47814 Spondylosis without myelopathy or radiculopathy, thoracic region: Secondary | ICD-10-CM | POA: Diagnosis not present

## 2018-12-03 DIAGNOSIS — Z471 Aftercare following joint replacement surgery: Secondary | ICD-10-CM | POA: Diagnosis not present

## 2018-12-03 DIAGNOSIS — I10 Essential (primary) hypertension: Secondary | ICD-10-CM | POA: Diagnosis not present

## 2018-12-03 NOTE — Telephone Encounter (Signed)
I called patient and advised, ok for shower. No submerging in bath tub.

## 2018-12-04 ENCOUNTER — Telehealth: Payer: Self-pay | Admitting: Orthopaedic Surgery

## 2018-12-04 DIAGNOSIS — K449 Diaphragmatic hernia without obstruction or gangrene: Secondary | ICD-10-CM | POA: Diagnosis not present

## 2018-12-04 DIAGNOSIS — K219 Gastro-esophageal reflux disease without esophagitis: Secondary | ICD-10-CM | POA: Diagnosis not present

## 2018-12-04 DIAGNOSIS — G8929 Other chronic pain: Secondary | ICD-10-CM | POA: Diagnosis not present

## 2018-12-04 DIAGNOSIS — D649 Anemia, unspecified: Secondary | ICD-10-CM | POA: Diagnosis not present

## 2018-12-04 DIAGNOSIS — I10 Essential (primary) hypertension: Secondary | ICD-10-CM | POA: Diagnosis not present

## 2018-12-04 DIAGNOSIS — Z471 Aftercare following joint replacement surgery: Secondary | ICD-10-CM | POA: Diagnosis not present

## 2018-12-04 DIAGNOSIS — E1142 Type 2 diabetes mellitus with diabetic polyneuropathy: Secondary | ICD-10-CM | POA: Diagnosis not present

## 2018-12-04 DIAGNOSIS — M47814 Spondylosis without myelopathy or radiculopathy, thoracic region: Secondary | ICD-10-CM | POA: Diagnosis not present

## 2018-12-04 DIAGNOSIS — E785 Hyperlipidemia, unspecified: Secondary | ICD-10-CM | POA: Diagnosis not present

## 2018-12-04 DIAGNOSIS — E7800 Pure hypercholesterolemia, unspecified: Secondary | ICD-10-CM | POA: Diagnosis not present

## 2018-12-04 NOTE — Telephone Encounter (Signed)
Verbal order given  

## 2018-12-04 NOTE — Telephone Encounter (Signed)
Ree, from [facility] left a message yesterday requesting VO for Partial Weight Bearing for 1x a week for 1 week, and then 1x a week for one week.  They will resume PT after next appointment.  CB#[phone].  Thank you.

## 2018-12-07 ENCOUNTER — Other Ambulatory Visit: Payer: Self-pay | Admitting: Family Medicine

## 2018-12-07 DIAGNOSIS — G894 Chronic pain syndrome: Secondary | ICD-10-CM | POA: Diagnosis not present

## 2018-12-07 DIAGNOSIS — F411 Generalized anxiety disorder: Secondary | ICD-10-CM

## 2018-12-07 DIAGNOSIS — G4700 Insomnia, unspecified: Secondary | ICD-10-CM | POA: Diagnosis not present

## 2018-12-07 DIAGNOSIS — E1142 Type 2 diabetes mellitus with diabetic polyneuropathy: Secondary | ICD-10-CM | POA: Diagnosis not present

## 2018-12-07 DIAGNOSIS — Z79891 Long term (current) use of opiate analgesic: Secondary | ICD-10-CM | POA: Diagnosis not present

## 2018-12-09 ENCOUNTER — Ambulatory Visit: Payer: Medicare HMO | Admitting: Orthopaedic Surgery

## 2018-12-10 DIAGNOSIS — E1142 Type 2 diabetes mellitus with diabetic polyneuropathy: Secondary | ICD-10-CM | POA: Diagnosis not present

## 2018-12-10 DIAGNOSIS — M47814 Spondylosis without myelopathy or radiculopathy, thoracic region: Secondary | ICD-10-CM | POA: Diagnosis not present

## 2018-12-10 DIAGNOSIS — K449 Diaphragmatic hernia without obstruction or gangrene: Secondary | ICD-10-CM | POA: Diagnosis not present

## 2018-12-10 DIAGNOSIS — E785 Hyperlipidemia, unspecified: Secondary | ICD-10-CM | POA: Diagnosis not present

## 2018-12-10 DIAGNOSIS — I10 Essential (primary) hypertension: Secondary | ICD-10-CM | POA: Diagnosis not present

## 2018-12-10 DIAGNOSIS — G8929 Other chronic pain: Secondary | ICD-10-CM | POA: Diagnosis not present

## 2018-12-10 DIAGNOSIS — D649 Anemia, unspecified: Secondary | ICD-10-CM | POA: Diagnosis not present

## 2018-12-10 DIAGNOSIS — K219 Gastro-esophageal reflux disease without esophagitis: Secondary | ICD-10-CM | POA: Diagnosis not present

## 2018-12-10 DIAGNOSIS — E7800 Pure hypercholesterolemia, unspecified: Secondary | ICD-10-CM | POA: Diagnosis not present

## 2018-12-10 DIAGNOSIS — Z471 Aftercare following joint replacement surgery: Secondary | ICD-10-CM | POA: Diagnosis not present

## 2018-12-15 ENCOUNTER — Ambulatory Visit (INDEPENDENT_AMBULATORY_CARE_PROVIDER_SITE_OTHER): Payer: Medicare HMO

## 2018-12-15 ENCOUNTER — Encounter: Payer: Self-pay | Admitting: Orthopaedic Surgery

## 2018-12-15 ENCOUNTER — Ambulatory Visit: Payer: Medicare HMO

## 2018-12-15 ENCOUNTER — Ambulatory Visit (INDEPENDENT_AMBULATORY_CARE_PROVIDER_SITE_OTHER): Payer: Medicare HMO | Admitting: Orthopaedic Surgery

## 2018-12-15 DIAGNOSIS — M25562 Pain in left knee: Secondary | ICD-10-CM

## 2018-12-15 DIAGNOSIS — Z96642 Presence of left artificial hip joint: Secondary | ICD-10-CM

## 2018-12-15 DIAGNOSIS — M1611 Unilateral primary osteoarthritis, right hip: Secondary | ICD-10-CM | POA: Diagnosis not present

## 2018-12-15 DIAGNOSIS — G8929 Other chronic pain: Secondary | ICD-10-CM

## 2018-12-15 DIAGNOSIS — M5136 Other intervertebral disc degeneration, lumbar region: Secondary | ICD-10-CM | POA: Diagnosis not present

## 2018-12-15 MED ORDER — LIDOCAINE HCL 1 % IJ SOLN
2.0000 mL | INTRAMUSCULAR | Status: AC | PRN
  Administered 2018-12-15: 2 mL

## 2018-12-15 MED ORDER — METHYLPREDNISOLONE ACETATE 40 MG/ML IJ SUSP
40.0000 mg | INTRAMUSCULAR | Status: AC | PRN
  Administered 2018-12-15: 40 mg via INTRA_ARTICULAR

## 2018-12-15 MED ORDER — BUPIVACAINE HCL 0.5 % IJ SOLN
2.0000 mL | INTRAMUSCULAR | Status: AC | PRN
  Administered 2018-12-15: 2 mL via INTRA_ARTICULAR

## 2018-12-15 NOTE — Progress Notes (Signed)
Office Visit Note   Patient: Brittany [name]           Date of Birth: 1945/07/13           MRN: [id number] Visit Date: 12/15/2018              Requested by: [address], [place], [place] [address] [place],  [place] [address] PCP: Carollee Herter, Alferd Apa, DO   Assessment & Plan: Visit Diagnoses:  1. Status post total hip replacement, left   2. Chronic pain of left knee     Plan: She is doing well status post left total hip replacement.  She has not having any significant discomfort from the hip.  Her main complaint is the left knee and how swollen it is.  I attempted aspiration but did not give much joint fluid therefore I think that the biggest problem is her swelling.  We have prescribed thigh-high TED hose.  I like to see her back in 2 more weeks with standing AP pelvis.  She will continue 50% weightbearing for now.  Follow-Up Instructions: Return in about 2 weeks (around 12/29/2018).   Orders:  Orders Placed This Encounter  Procedures   XR HIP UNILAT W OR W/O PELVIS 2-3 VIEWS LEFT   No orders of the defined types were placed in this encounter.     Procedures: Large Joint Inj: L knee on 12/15/2018 1:31 PM Details: 22 G needle Medications: 2 mL bupivacaine 0.5 %; 2 mL lidocaine 1 %; 40 mg methylPREDNISolone acetate 40 MG/ML Outcome: tolerated well, no immediate complications Patient was prepped and draped in the usual sterile fashion.       Clinical Data: No additional findings.   Subjective: Chief Complaint  Patient presents with   Left Hip - Follow-up    Brittany [name] is 4 weeks status post left total hip replacement.  She is also complaining of significant left knee pain and swelling.  The Doppler was negative for DVT.  She is a limited mainly because of how painful the left knee is.   Review of Systems   Objective: Vital Signs: There were no vitals taken for this visit.  Physical Exam  Ortho Exam Left knee exam shows pitting edema.  Difficult to  assess whether there is a true joint effusion versus just pitting edema. Surgical scar is fully healed.  No signs of infection.  No pain with hip movement. Specialty Comments:  No specialty comments available.  Imaging: No results found.   PMFS History: Patient Active Problem List   Diagnosis Date Noted   Status post total replacement of left hip 11/16/2018   Vaginal prolapse 09/30/2018   Primary osteoarthritis of left hip 06/10/2018   Osteomyelitis of second toe of right foot ([place]) 04/24/2018   Osteomyelitis of right foot ([place]) 04/21/2018   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 ([place]) 04/17/2018   Callus 01/13/2018   Generalized anxiety disorder 10/16/2017   Menopause 10/16/2017   DM (diabetes mellitus) type II uncontrolled, periph vascular disorder ([place]) 10/16/2017   Hyperlipidemia associated with type 2 diabetes mellitus ([place]) 10/16/2017   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 ([place]) 10/14/2017   Osteomyelitis of great toe of right foot ([place]) 09/25/2017   Pain in right foot 09/16/2017   Cellulitis 08/08/2017   Hypoglycemia without diagnosis of diabetes mellitus 08/08/2017   Peripheral neuropathy 08/08/2017   Abscess 08/08/2017   Hyperlipidemia LDL goal <70 09/22/2016   Tooth  pain 09/03/2012   Edema 09/03/2012   Supraclavicular fossa fullness 06/18/2012   Hair loss 09/18/2010   Fatigue 09/18/2010   DYSPEPSIA&OTHER SPEC DISORDERS FUNCTION STOMACH 05/08/2010   DIARRHEA 05/08/2010   DIZZINESS 04/19/2010   OTHER ACUTE SINUSITIS 02/28/2010   NAUSEA 02/28/2010   Pain in right ankle and joints of right foot 02/15/2010   URI 07/09/2007   GERD 02/12/2007   LOW BACK PAIN 02/12/2007   Hypertension 09/03/2006   HOT FLASHES 09/03/2006   INSOMNIA 09/03/2006   Past Medical History:  Diagnosis Date   Anemia    as a younger woman   Arthritis    "all over my body" (10/01/2017)   Chronic pain    In pain clinic     Diabetic peripheral neuropathy (HCC)    GERD (gastroesophageal reflux disease)    High cholesterol    History of hiatal hernia    Hypertension    Type II diabetes mellitus ([place]) dx'd ~ 2008    Family History  Problem Relation Age of Onset   Cancer Mother        breast   Depression Mother        bipolar   Breast cancer Mother 35   Heart disease Father 71       MI   Diabetes Brother    Kidney disease Brother    Hypertension Brother    Hyperlipidemia Brother    Stroke Brother    Coronary artery disease Other    Diabetes Other     Past Surgical History:  Procedure Laterality Date   AMPUTATION Right 09/29/2017   Procedure: AMPUTATION RIGHT 1ST RAY;  Surgeon: Leandrew Koyanagi, MD;  Location: [facility];  Service: Orthopedics;  Laterality: Right;   AMPUTATION TOE Right 04/24/2018   Procedure: RIGHT 2ND TOE PROXIMAL INTERPHALANGEAL JOINT DISARTICULATION;  Surgeon: Leandrew Koyanagi, MD;  Location: [facility];  Service: Orthopedics;  Laterality: Right;   APPLICATION OF WOUND VAC Right 09/29/2017   "foot"   BACK SURGERY     COLONOSCOPY     I&D EXTREMITY Right 08/10/2017   Procedure: IRRIGATION AND DEBRIDEMENT FOOT;  Surgeon: Leandrew Koyanagi, MD;  Location: [facility];  Service: Orthopedics;  Laterality: Right;   LUMBAR DISC SURGERY  1992   POSTERIOR LUMBAR FUSION  1996   TOE SURGERY Right 1980s   bone removed "inbetween my toes"   TOTAL HIP ARTHROPLASTY Left 11/16/2018   Procedure: LEFT TOTAL HIP ARTHROPLASTY ANTERIOR APPROACH;  Surgeon: Leandrew Koyanagi, MD;  Location: [facility];  Service: Orthopedics;  Laterality: Left;   VAGINAL HYSTERECTOMY     Social History   Occupational History   Not on file  Tobacco Use   Smoking status: Never Smoker   Smokeless tobacco: Never Used  Substance and Sexual Activity   Alcohol use: Never    Frequency: Never   Drug use: Never   Sexual activity: Not Currently    Partners: Male

## 2018-12-17 ENCOUNTER — Telehealth: Payer: Self-pay | Admitting: Orthopaedic Surgery

## 2018-12-17 NOTE — Telephone Encounter (Signed)
Don from [facility] called. Said he needs a verbal order for 1x wk pt. She refused this weeks. His number is [phone]

## 2018-12-18 ENCOUNTER — Telehealth: Payer: Self-pay | Admitting: Orthopaedic Surgery

## 2018-12-18 NOTE — Telephone Encounter (Signed)
Ok thanks 

## 2018-12-18 NOTE — Telephone Encounter (Signed)
Sree from [facility] left a voicemail message to let Dr. Erlinda Hong know that the patient refused therapy this week due to pain and wanted to change the VO to starting therapy next week instead of this week.  CB#[phone].  Thank you.

## 2018-12-18 NOTE — Telephone Encounter (Signed)
Duplicate message in chart forwarded to Dr. Erlinda Hong.

## 2018-12-18 NOTE — Telephone Encounter (Signed)
FYI - patient refused PT this week Verbal order given for PT next week.

## 2018-12-21 ENCOUNTER — Telehealth: Payer: Self-pay | Admitting: Orthopaedic Surgery

## 2018-12-21 DIAGNOSIS — D649 Anemia, unspecified: Secondary | ICD-10-CM | POA: Diagnosis not present

## 2018-12-21 DIAGNOSIS — K219 Gastro-esophageal reflux disease without esophagitis: Secondary | ICD-10-CM | POA: Diagnosis not present

## 2018-12-21 DIAGNOSIS — Z471 Aftercare following joint replacement surgery: Secondary | ICD-10-CM | POA: Diagnosis not present

## 2018-12-21 DIAGNOSIS — E1142 Type 2 diabetes mellitus with diabetic polyneuropathy: Secondary | ICD-10-CM | POA: Diagnosis not present

## 2018-12-21 DIAGNOSIS — K449 Diaphragmatic hernia without obstruction or gangrene: Secondary | ICD-10-CM | POA: Diagnosis not present

## 2018-12-21 DIAGNOSIS — G8929 Other chronic pain: Secondary | ICD-10-CM | POA: Diagnosis not present

## 2018-12-21 DIAGNOSIS — E7800 Pure hypercholesterolemia, unspecified: Secondary | ICD-10-CM | POA: Diagnosis not present

## 2018-12-21 DIAGNOSIS — M47814 Spondylosis without myelopathy or radiculopathy, thoracic region: Secondary | ICD-10-CM | POA: Diagnosis not present

## 2018-12-21 DIAGNOSIS — I10 Essential (primary) hypertension: Secondary | ICD-10-CM | POA: Diagnosis not present

## 2018-12-21 DIAGNOSIS — E785 Hyperlipidemia, unspecified: Secondary | ICD-10-CM | POA: Diagnosis not present

## 2018-12-21 NOTE — Telephone Encounter (Signed)
Don/BAYADA/OT called to get verbal order for OP since one session was cancelled.  Please call Don @ [phone]

## 2018-12-21 NOTE — Telephone Encounter (Signed)
Verbal order given  

## 2018-12-22 ENCOUNTER — Other Ambulatory Visit: Payer: Self-pay | Admitting: Family Medicine

## 2018-12-22 DIAGNOSIS — Z780 Asymptomatic menopausal state: Secondary | ICD-10-CM

## 2018-12-29 ENCOUNTER — Ambulatory Visit: Payer: Medicare HMO | Admitting: Orthopaedic Surgery

## 2019-01-04 ENCOUNTER — Telehealth: Payer: Self-pay

## 2019-01-04 NOTE — Telephone Encounter (Signed)
Physical therapist with [facility] called stating that patient is refusing HHPT at this time, due to sister being in the hospital and she is taking care of her.  Cb# is [phone].  Please advise.  Thank You.

## 2019-01-05 ENCOUNTER — Ambulatory Visit: Payer: Medicare HMO | Admitting: Orthopaedic Surgery

## 2019-01-05 NOTE — Telephone Encounter (Signed)
FYI

## 2019-01-12 ENCOUNTER — Other Ambulatory Visit: Payer: Self-pay | Admitting: Family Medicine

## 2019-01-12 DIAGNOSIS — I10 Essential (primary) hypertension: Secondary | ICD-10-CM

## 2019-01-19 ENCOUNTER — Other Ambulatory Visit: Payer: Self-pay

## 2019-01-19 ENCOUNTER — Ambulatory Visit (INDEPENDENT_AMBULATORY_CARE_PROVIDER_SITE_OTHER): Payer: Medicare HMO

## 2019-01-19 ENCOUNTER — Encounter: Payer: Self-pay | Admitting: Orthopaedic Surgery

## 2019-01-19 ENCOUNTER — Ambulatory Visit (INDEPENDENT_AMBULATORY_CARE_PROVIDER_SITE_OTHER): Payer: Medicare HMO | Admitting: Orthopaedic Surgery

## 2019-01-19 VITALS — Ht 69.0 in | Wt 166.0 lb

## 2019-01-19 DIAGNOSIS — M1612 Unilateral primary osteoarthritis, left hip: Secondary | ICD-10-CM

## 2019-01-19 DIAGNOSIS — L97511 Non-pressure chronic ulcer of other part of right foot limited to breakdown of skin: Secondary | ICD-10-CM

## 2019-01-19 MED ORDER — DOXYCYCLINE HYCLATE 100 MG PO TABS: 100.0000 mg | ORAL_TABLET | Freq: Two times a day (BID) | ORAL | 0 refills | Status: DC

## 2019-01-19 NOTE — Addendum Note (Signed)
Addended by: Precious Bard on: 01/19/2019 04:56 PM   Modules accepted: Orders

## 2019-01-19 NOTE — Progress Notes (Signed)
Office Visit Note   Patient: Brittany [name]           Date of Birth: 03-Nov-1945           MRN: [id number] Visit Date: 01/19/2019              Requested by: [address] [place], [place] [address] [place],  [place] [address] PCP: Carollee Herter, Alferd Apa, DO   Assessment & Plan: Visit Diagnoses:  1. Primary osteoarthritis of left hip   2. Skin ulcer of third toe of right foot, limited to breakdown of skin ([place])     Plan: Impression is status post left total hip replacement and doing well.  New problem of right third toe ulcer.  Mupirocin ointment was provided today to apply twice a day with a Band-Aid as well as a toe sleeve.  Prescription for doxycycline was sent in today.  We will make a referral to the wound center.  I would like to recheck her right foot in 3 weeks.  Follow-Up Instructions: Return in about 3 weeks (around 02/09/2019).   Orders:  Orders Placed This Encounter  Procedures  . XR HIP UNILAT W OR W/O PELVIS 2-3 VIEWS LEFT  . XR Foot Complete Right   Meds ordered this encounter  Medications  . doxycycline (VIBRA-TABS) 100 MG tablet    Sig: Take 1 tablet (100 mg total) by mouth 2 (two) times daily.    Dispense:  20 tablet    Refill:  0      Procedures: No procedures performed   Clinical Data: No additional findings.   Subjective: Chief Complaint  Patient presents with  . Left Hip - Pain  . Left Knee - Pain  . Right Foot - Pain    Brittany [name] is a 73 year old female who is approximately 6 to 7 weeks status post left total hip replacement.  She is doing well overall in that regard.  She is ambulating with a trekking pole.  She does have a separate issue that she is coming in for today which is a new ulcer to the right third toe.  She does have insensate diabetic peripheral neuropathy.  She is status post first ray amputation and partial second toe amputation.   Review of Systems  Constitutional: Negative.   HENT: Negative.   Eyes: Negative.    Respiratory: Negative.   Cardiovascular: Negative.   Endocrine: Negative.   Musculoskeletal: Negative.   Neurological: Negative.   Hematological: Negative.   Psychiatric/Behavioral: Negative.   All other systems reviewed and are negative.    Objective: Vital Signs: Ht 5\' 9"  (1.753 m)   Wt 166 lb (75.3 kg)   BMI 24.51 kg/m   Physical Exam Vitals signs and nursing note reviewed.  Constitutional:      Appearance: She is well-developed.  Pulmonary:     Effort: Pulmonary effort is normal.  Skin:    General: Skin is warm.     Capillary Refill: Capillary refill takes less than 2 seconds.  Neurological:     Mental Status: She is alert and oriented to person, place, and time.  Psychiatric:        Behavior: Behavior normal.        Thought Content: Thought content normal.        Judgment: Judgment normal.     Ortho Exam Left hip exam shows a fully healed surgical scar.  No signs of infection.  No pain with hip range of motion.  Right foot exam shows a superficial Wagner 1 ulcer of the tip of the toe.  This was probed with a Q-tip.  There is scant serous drainage. Specialty Comments:  No specialty comments available.  Imaging: Xr Hip Unilat W Or W/o Pelvis 2-3 Views Left  Result Date: 01/19/2019 Stable left total hip replacement without complication.  Acetabular component is stable.  There is no worsening or displacement of the acetabular fracture.  Xr Foot Complete Right  Result Date: 01/19/2019 No evidence of bony erosion    PMFS History: Patient Active Problem List   Diagnosis Date Noted  . 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 Vaginal prolapse 09/30/2018  . Primary osteoarthritis of left hip 06/10/2018  . Osteomyelitis of second toe of right foot ([place]) 04/24/2018  . Osteomyelitis of right foot ([place]) 04/21/2018  . 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 ([place]) 04/17/2018  . Callus 01/13/2018  . Generalized anxiety disorder 10/16/2017  .  Menopause 10/16/2017  . DM (diabetes mellitus) type II uncontrolled, periph vascular disorder ([place]) 10/16/2017  . Hyperlipidemia associated with type 2 diabetes mellitus ([place]) 10/16/2017  . 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 Osteomyelitis of great toe of right foot ([place]) 09/25/2017  . Pain in right foot 09/16/2017  . Cellulitis 08/08/2017  . Hypoglycemia without diagnosis of diabetes mellitus 08/08/2017  . Peripheral neuropathy 08/08/2017  . Abscess 08/08/2017  . Hyperlipidemia LDL goal <70 09/22/2016  . Tooth pain 09/03/2012  . Edema 09/03/2012  . Supraclavicular fossa fullness 06/18/2012  . Hair loss 09/18/2010  . Fatigue 09/18/2010  . DYSPEPSIA&OTHER [facility] DISORDERS FUNCTION STOMACH 05/08/2010  . DIARRHEA 05/08/2010  . DIZZINESS 04/19/2010  . OTHER ACUTE SINUSITIS 02/28/2010  . NAUSEA 02/28/2010  . Pain in right ankle and joints of right foot 02/15/2010  . URI 07/09/2007  . GERD 02/12/2007  . LOW BACK PAIN 02/12/2007  . Hypertension 09/03/2006  . HOT FLASHES 09/03/2006  . INSOMNIA 09/03/2006   Past Medical History:  Diagnosis Date  . Anemia    as a younger woman  . Arthritis    "all over my body" (10/01/2017)  . Chronic pain    In pain clinic   . Diabetic peripheral neuropathy ([place])   . GERD (gastroesophageal reflux disease)   . High cholesterol   . History of hiatal hernia   . Hypertension   . Type II diabetes mellitus ([place]) dx'd ~ 2008    Family History  Problem Relation Age of Onset  . Cancer Mother        breast  . Depression Mother        bipolar  . Breast cancer Mother 66  . Heart disease Father 56       MI  . Diabetes Brother   . Kidney disease Brother   . Hypertension Brother   . Hyperlipidemia Brother   . Stroke Brother   . Coronary artery disease Other   . Diabetes Other     Past Surgical History:  Procedure Laterality Date  . AMPUTATION Right 09/29/2017   Procedure: AMPUTATION RIGHT 1ST RAY;   Surgeon: Leandrew Koyanagi, MD;  Location: [facility];  Service: Orthopedics;  Laterality: Right;  . AMPUTATION TOE Right 04/24/2018   Procedure: RIGHT 2ND TOE PROXIMAL INTERPHALANGEAL JOINT DISARTICULATION;  Surgeon: Leandrew Koyanagi, MD;  Location: [facility];  Service: Orthopedics;  Laterality: Right;  . APPLICATION OF WOUND VAC Right 09/29/2017   "  foot"  . BACK SURGERY    . COLONOSCOPY    . I&D EXTREMITY Right 08/10/2017   Procedure: IRRIGATION AND DEBRIDEMENT FOOT;  Surgeon: Leandrew Koyanagi, MD;  Location: [facility];  Service: Orthopedics;  Laterality: Right;  . [place]  . POSTERIOR LUMBAR FUSION  1996  . TOE SURGERY Right 1980s   bone removed "inbetween my toes"  . TOTAL HIP ARTHROPLASTY Left 11/16/2018   Procedure: LEFT TOTAL HIP ARTHROPLASTY ANTERIOR APPROACH;  Surgeon: Leandrew Koyanagi, MD;  Location: [facility];  Service: Orthopedics;  Laterality: Left;  [name] VAGINAL HYSTERECTOMY     Social History   Occupational History  . Not on file  Tobacco Use  . Smoking status: Never Smoker  . Smokeless tobacco: Never Used  Substance and Sexual Activity  . Alcohol use: Never    Frequency: Never  . Drug use: Never  . Sexual activity: Not Currently    Partners: Male

## 2019-01-21 ENCOUNTER — Telehealth: Payer: Self-pay | Admitting: Orthopaedic Surgery

## 2019-01-21 NOTE — Telephone Encounter (Signed)
APPT MADE WITH DUDA

## 2019-01-21 NOTE — Telephone Encounter (Signed)
Ok I would like her to see Dr. Sharol Given ASAP then please.  Thanks.

## 2019-01-21 NOTE — Telephone Encounter (Signed)
See message.

## 2019-01-21 NOTE — Telephone Encounter (Signed)
Patient left a voicemail stating that she could not get an appointment with the wound care until 02/08/19 and is very upset with this because she does not want to lose another toe.  The doctor that Dr. Erlinda Hong referred her to is also needing the notes from her visit.  CB#[phone].  Thank you.

## 2019-01-21 NOTE — Telephone Encounter (Signed)
PATIENT AWARE

## 2019-01-25 ENCOUNTER — Ambulatory Visit (INDEPENDENT_AMBULATORY_CARE_PROVIDER_SITE_OTHER): Payer: Medicare HMO | Admitting: Orthopedic Surgery

## 2019-01-25 ENCOUNTER — Other Ambulatory Visit: Payer: Self-pay

## 2019-01-25 ENCOUNTER — Encounter: Payer: Self-pay | Admitting: Orthopedic Surgery

## 2019-01-25 VITALS — Ht 69.0 in | Wt 166.0 lb

## 2019-01-25 DIAGNOSIS — L97511 Non-pressure chronic ulcer of other part of right foot limited to breakdown of skin: Secondary | ICD-10-CM

## 2019-01-25 DIAGNOSIS — E1142 Type 2 diabetes mellitus with diabetic polyneuropathy: Secondary | ICD-10-CM | POA: Diagnosis not present

## 2019-01-25 DIAGNOSIS — M869 Osteomyelitis, unspecified: Secondary | ICD-10-CM

## 2019-01-26 ENCOUNTER — Encounter: Payer: Self-pay | Admitting: Orthopedic Surgery

## 2019-01-26 NOTE — Progress Notes (Signed)
Office Visit Note   Patient: Brittany [name]           Date of Birth: 12-15-1945           MRN: [id number] Visit Date: 01/25/2019              Requested by: [address], [place], [place] [address] [place],  [place] [address] PCP: Carollee Herter, Alferd Apa, DO  Chief Complaint  Patient presents with  . Left Foot - Pain    Left 3rd toe wound per Dr Erlinda Hong      HPI: Patient is a 73 year old woman who was seen for initial evaluation for ulceration right foot third toe.  Patient has been on doxycycline as well as Bactroban ointment.  Patient states she does have neuropathy from her diabetes.  Assessment & Plan: Visit Diagnoses:  1. Skin ulcer of third toe of right foot, limited to breakdown of skin ([place])     Plan: Patient has osteomyelitis of the third toe status post second toe partial amputation and great toe amputation.  Patient will require a transmetatarsal amputation she states she would like to set this up on Friday.  Anticipate outpatient surgery.  Follow-Up Instructions: Return in about 1 week (around 02/01/2019).   Ortho Exam  Patient is alert, oriented, no adenopathy, well-dressed, normal affect, normal respiratory effort. Examination patient is a strong dorsalis pedis and posterior tibial pulse.  She has ulceration of the third toe right foot that probes to bone with osteomyelitis.  Patient is status post a great toe amputation as well as a partial amputation of the second toe.  There is sausage digit swelling of the third toe with cellulitis.  Imaging: No results found. No images are attached to the encounter.  Labs: Lab Results  Component Value Date   HGBA1C 6.3 (H) 11/12/2018   HGBA1C 6.9 (H) 04/17/2018   HGBA1C 6.2 (H) 08/08/2017   REPTSTATUS 08/15/2017 FINAL 08/10/2017   GRAMSTAIN NO WBC SEEN NO ORGANISMS SEEN  08/10/2017   CULT  08/10/2017    No growth aerobically or anaerobically. Performed at [facility], [place] [address],  [place],  [address]    Doy Hutching  03/11/2016    Three or more organisms present,each greater than 10,000 CFU/mL.These organisms,commonly found on external and internal genitalia,are considered to be colonizers.No further testing performed.      Lab Results  Component Value Date   ALBUMIN 3.6 11/12/2018   ALBUMIN 3.7 04/17/2018   ALBUMIN 3.6 10/16/2017    No results found for: MG No results found for: VD25OH  No results found for: PREALBUMIN CBC EXTENDED Latest Ref Rng & Units 11/18/2018 11/17/2018 11/12/2018  WBC 4.0 - 10.5 K/uL 14.0(H) 10.2 8.3  RBC 3.87 - 5.11 MIL/uL 3.34(L) 3.45(L) 4.64  HGB 12.0 - 15.0 g/dL 9.6(L) 9.9(L) 13.0  HCT 36.0 - 46.0 % 28.8(L) 30.0(L) 41.1  PLT 150 - 400 K/uL 164 165 222  NEUTROABS 1.7 - 7.7 K/uL - - 5.9  LYMPHSABS 0.7 - 4.0 K/uL - - 1.6     Body mass index is 24.51 kg/m.  Orders:  No orders of the defined types were placed in this encounter.  No orders of the defined types were placed in this encounter.    Procedures: No procedures performed  Clinical Data: No additional findings.  ROS:  All other systems negative, except as noted in the HPI. Review of Systems  Objective: Vital Signs: Ht 5\' 9"  (1.753 m)  Wt 166 lb (75.3 kg)   BMI 24.51 kg/m   Specialty Comments:  No specialty comments available.  PMFS History: Patient Active Problem List   Diagnosis Date Noted  . 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 Vaginal prolapse 09/30/2018  . Primary osteoarthritis of left hip 06/10/2018  . Osteomyelitis of second toe of right foot ([place]) 04/24/2018  . Osteomyelitis of right foot ([place]) 04/21/2018  . 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 ([place]) 04/17/2018  . Callus 01/13/2018  . Generalized anxiety disorder 10/16/2017  . Menopause 10/16/2017  . DM (diabetes mellitus) type II uncontrolled, periph vascular disorder ([place]) 10/16/2017  . Hyperlipidemia associated with type 2 diabetes mellitus ([place]) 10/16/2017   . 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 Osteomyelitis of great toe of right foot ([place]) 09/25/2017  . Pain in right foot 09/16/2017  . Cellulitis 08/08/2017  . Hypoglycemia without diagnosis of diabetes mellitus 08/08/2017  . Peripheral neuropathy 08/08/2017  . Abscess 08/08/2017  . Hyperlipidemia LDL goal <70 09/22/2016  . Tooth pain 09/03/2012  . Edema 09/03/2012  . Supraclavicular fossa fullness 06/18/2012  . Hair loss 09/18/2010  . Fatigue 09/18/2010  . DYSPEPSIA&OTHER [facility] DISORDERS FUNCTION STOMACH 05/08/2010  . DIARRHEA 05/08/2010  . DIZZINESS 04/19/2010  . OTHER ACUTE SINUSITIS 02/28/2010  . NAUSEA 02/28/2010  . Pain in right ankle and joints of right foot 02/15/2010  . URI 07/09/2007  . GERD 02/12/2007  . LOW BACK PAIN 02/12/2007  . Hypertension 09/03/2006  . HOT FLASHES 09/03/2006  . INSOMNIA 09/03/2006   Past Medical History:  Diagnosis Date  . Anemia    as a younger woman  . Arthritis    "all over my body" (10/01/2017)  . Chronic pain    In pain clinic   . Diabetic peripheral neuropathy ([place])   . GERD (gastroesophageal reflux disease)   . High cholesterol   . History of hiatal hernia   . Hypertension   . Type II diabetes mellitus ([place]) dx'd ~ 2008    Family History  Problem Relation Age of Onset  . Cancer Mother        breast  . Depression Mother        bipolar  . Breast cancer Mother 23  . Heart disease Father 30       MI  . Diabetes Brother   . Kidney disease Brother   . Hypertension Brother   . Hyperlipidemia Brother   . Stroke Brother   . Coronary artery disease Other   . Diabetes Other     Past Surgical History:  Procedure Laterality Date  . AMPUTATION Right 09/29/2017   Procedure: AMPUTATION RIGHT 1ST RAY;  Surgeon: Leandrew Koyanagi, MD;  Location: [facility];  Service: Orthopedics;  Laterality: Right;  . AMPUTATION TOE Right 04/24/2018   Procedure: RIGHT 2ND TOE PROXIMAL INTERPHALANGEAL JOINT  DISARTICULATION;  Surgeon: Leandrew Koyanagi, MD;  Location: [facility];  Service: Orthopedics;  Laterality: Right;  . APPLICATION OF WOUND VAC Right 09/29/2017   "foot"  . BACK SURGERY    . COLONOSCOPY    . I&D EXTREMITY Right 08/10/2017   Procedure: IRRIGATION AND DEBRIDEMENT FOOT;  Surgeon: Leandrew Koyanagi, MD;  Location: [facility];  Service: Orthopedics;  Laterality: Right;  . [place]  . POSTERIOR LUMBAR FUSION  1996  . TOE SURGERY Right 1980s   bone removed "inbetween my toes"  . TOTAL  HIP ARTHROPLASTY Left 11/16/2018   Procedure: LEFT TOTAL HIP ARTHROPLASTY ANTERIOR APPROACH;  Surgeon: Leandrew Koyanagi, MD;  Location: [facility];  Service: Orthopedics;  Laterality: Left;  [name] VAGINAL HYSTERECTOMY     Social History   Occupational History  . Not on file  Tobacco Use  . Smoking status: Never Smoker  . Smokeless tobacco: Never Used  Substance and Sexual Activity  . Alcohol use: Never    Frequency: Never  . Drug use: Never  . Sexual activity: Not Currently    Partners: Male

## 2019-01-28 ENCOUNTER — Other Ambulatory Visit: Payer: Self-pay

## 2019-01-28 ENCOUNTER — Other Ambulatory Visit (HOSPITAL_COMMUNITY)
Admission: RE | Admit: 2019-01-28 | Discharge: 2019-01-28 | Disposition: A | Discharge status: Home / Self Care | Payer: Medicare HMO | Source: Ambulatory Visit | Attending: Orthopedic Surgery | Admitting: Orthopedic Surgery

## 2019-01-28 ENCOUNTER — Encounter (HOSPITAL_COMMUNITY): Payer: Self-pay | Admitting: *Deleted

## 2019-01-28 ENCOUNTER — Other Ambulatory Visit: Payer: Self-pay | Admitting: Physician Assistant

## 2019-01-28 DIAGNOSIS — Z01812 Encounter for preprocedural laboratory examination: Secondary | ICD-10-CM | POA: Insufficient documentation

## 2019-01-28 DIAGNOSIS — Z20828 Contact with and (suspected) exposure to other viral communicable diseases: Secondary | ICD-10-CM | POA: Insufficient documentation

## 2019-01-28 LAB — SARS CORONAVIRUS 2 (TAT 6-24 HRS): SARS Coronavirus 2: NEGATIVE

## 2019-01-28 NOTE — Progress Notes (Addendum)
Brittany [name] denies chest pain or shortness of breath. Patient was tested for Covid 01/28/2019 and has been in quarantine since that time. Patient has type II diabetes. I instructed patient to check CBG after awaking and every 2 hours until arrival  to the hospital.  I Instructed patient if CBG is less than 70 to drink  1/2 cup of a clear juice. Recheck CBG in 15 minutes then call pre- op desk at [phone] for further instructions.

## 2019-01-29 ENCOUNTER — Ambulatory Visit (HOSPITAL_COMMUNITY)
Admission: RE | Admit: 2019-01-29 | Discharge: 2019-01-29 | Disposition: A | Discharge status: Home / Self Care | Payer: Medicare HMO | Attending: Orthopedic Surgery | Admitting: Orthopedic Surgery

## 2019-01-29 ENCOUNTER — Ambulatory Visit (HOSPITAL_COMMUNITY): Payer: Medicare HMO | Admitting: Anesthesiology

## 2019-01-29 ENCOUNTER — Encounter (HOSPITAL_COMMUNITY)
Admission: RE | Disposition: A | Discharge status: Home / Self Care | Payer: Self-pay | Source: Home / Self Care | Attending: Orthopedic Surgery

## 2019-01-29 DIAGNOSIS — I10 Essential (primary) hypertension: Secondary | ICD-10-CM | POA: Insufficient documentation

## 2019-01-29 DIAGNOSIS — E1142 Type 2 diabetes mellitus with diabetic polyneuropathy: Secondary | ICD-10-CM | POA: Diagnosis not present

## 2019-01-29 DIAGNOSIS — Z882 Allergy status to sulfonamides status: Secondary | ICD-10-CM | POA: Diagnosis not present

## 2019-01-29 DIAGNOSIS — G8918 Other acute postprocedural pain: Secondary | ICD-10-CM | POA: Diagnosis not present

## 2019-01-29 DIAGNOSIS — M1990 Unspecified osteoarthritis, unspecified site: Secondary | ICD-10-CM | POA: Diagnosis not present

## 2019-01-29 DIAGNOSIS — Z89421 Acquired absence of other right toe(s): Secondary | ICD-10-CM | POA: Insufficient documentation

## 2019-01-29 DIAGNOSIS — E11621 Type 2 diabetes mellitus with foot ulcer: Secondary | ICD-10-CM | POA: Diagnosis not present

## 2019-01-29 DIAGNOSIS — M868X7 Other osteomyelitis, ankle and foot: Secondary | ICD-10-CM | POA: Diagnosis not present

## 2019-01-29 DIAGNOSIS — Z8249 Family history of ischemic heart disease and other diseases of the circulatory system: Secondary | ICD-10-CM | POA: Diagnosis not present

## 2019-01-29 DIAGNOSIS — E1169 Type 2 diabetes mellitus with other specified complication: Secondary | ICD-10-CM | POA: Diagnosis not present

## 2019-01-29 DIAGNOSIS — Z981 Arthrodesis status: Secondary | ICD-10-CM | POA: Diagnosis not present

## 2019-01-29 DIAGNOSIS — Z96642 Presence of left artificial hip joint: Secondary | ICD-10-CM | POA: Insufficient documentation

## 2019-01-29 DIAGNOSIS — L97519 Non-pressure chronic ulcer of other part of right foot with unspecified severity: Secondary | ICD-10-CM | POA: Diagnosis not present

## 2019-01-29 DIAGNOSIS — M86271 Subacute osteomyelitis, right ankle and foot: Secondary | ICD-10-CM

## 2019-01-29 HISTORY — DX: Depression, unspecified: F32.A

## 2019-01-29 HISTORY — DX: Constipation, unspecified: K59.00

## 2019-01-29 HISTORY — PX: AMPUTATION: SHX166

## 2019-01-29 LAB — CBC
HCT: 39.6 % (ref 36.0–46.0)
Hemoglobin: 12.2 g/dL (ref 12.0–15.0)
MCH: 26.8 pg (ref 26.0–34.0)
MCHC: 30.8 g/dL (ref 30.0–36.0)
MCV: 87 fL (ref 80.0–100.0)
Platelets: 244 10*3/uL (ref 150–400)
RBC: 4.55 MIL/uL (ref 3.87–5.11)
RDW: 13.3 % (ref 11.5–15.5)
WBC: 10.6 10*3/uL — ABNORMAL HIGH (ref 4.0–10.5)
nRBC: 0 % (ref 0.0–0.2)

## 2019-01-29 LAB — BASIC METABOLIC PANEL
Anion gap: 12 (ref 5–15)
BUN: 19 mg/dL (ref 8–23)
CO2: 26 mmol/L (ref 22–32)
Calcium: 9.1 mg/dL (ref 8.9–10.3)
Chloride: 101 mmol/L (ref 98–111)
Creatinine, Ser: 0.75 mg/dL (ref 0.44–1.00)
GFR calc Af Amer: >60 mL/min (ref >60)
GFR calc non Af Amer: >60 mL/min (ref >60)
Glucose, Bld: 111 mg/dL — ABNORMAL HIGH (ref 70–99)
Potassium: 3.5 mmol/L (ref 3.5–5.1)
Sodium: 139 mmol/L (ref 135–145)

## 2019-01-29 LAB — GLUCOSE, CAPILLARY
Glucose-Capillary: 108 mg/dL — ABNORMAL HIGH (ref 70–99)
Glucose-Capillary: 113 mg/dL — ABNORMAL HIGH (ref 70–99)

## 2019-01-29 SURGERY — AMPUTATION, FOOT, RAY
Anesthesia: Monitor Anesthesia Care | Site: Foot | Laterality: Right

## 2019-01-29 MED ORDER — CHLORHEXIDINE GLUCONATE 4 % EX LIQD: 60.0000 mL | Freq: Once | CUTANEOUS | Status: DC

## 2019-01-29 MED ORDER — EPHEDRINE SULFATE 50 MG/ML IJ SOLN
INTRAMUSCULAR | Status: DC | PRN
  Administered 2019-01-29: 10 mg via INTRAVENOUS

## 2019-01-29 MED ORDER — POVIDONE-IODINE 10 % EX SWAB: 2.0000 "application " | Freq: Once | CUTANEOUS | Status: DC

## 2019-01-29 MED ORDER — ACETAMINOPHEN 500 MG PO TABS: 1000.0000 mg | ORAL_TABLET | Freq: Once | ORAL | Status: DC

## 2019-01-29 MED ORDER — FENTANYL CITRATE (PF) 100 MCG/2ML IJ SOLN: 25.0000 ug | INTRAMUSCULAR | Status: DC | PRN

## 2019-01-29 MED ORDER — CEFAZOLIN SODIUM-DEXTROSE 2-4 GM/100ML-% IV SOLN
INTRAVENOUS | Status: AC
  Filled 2019-01-29: qty 100

## 2019-01-29 MED ORDER — FENTANYL CITRATE (PF) 250 MCG/5ML IJ SOLN
INTRAMUSCULAR | Status: AC
  Filled 2019-01-29: qty 5

## 2019-01-29 MED ORDER — FENTANYL CITRATE (PF) 100 MCG/2ML IJ SOLN
100.0000 ug | Freq: Once | INTRAMUSCULAR | Status: AC
  Administered 2019-01-29: 50 ug via INTRAVENOUS

## 2019-01-29 MED ORDER — LACTATED RINGERS IV SOLN
INTRAVENOUS | Status: DC
  Administered 2019-01-29 (×2): via INTRAVENOUS

## 2019-01-29 MED ORDER — ACETAMINOPHEN 500 MG PO TABS
ORAL_TABLET | ORAL | Status: AC
  Administered 2019-01-29: 1000 mg
  Filled 2019-01-29: qty 2

## 2019-01-29 MED ORDER — MIDAZOLAM HCL 2 MG/2ML IJ SOLN
INTRAMUSCULAR | Status: AC
  Administered 2019-01-29: 1 mg
  Filled 2019-01-29: qty 2

## 2019-01-29 MED ORDER — PROPOFOL 500 MG/50ML IV EMUL
INTRAVENOUS | Status: DC | PRN
  Administered 2019-01-29: 75 ug/kg/min via INTRAVENOUS

## 2019-01-29 MED ORDER — FENTANYL CITRATE (PF) 100 MCG/2ML IJ SOLN
INTRAMUSCULAR | Status: AC
  Filled 2019-01-29: qty 2

## 2019-01-29 MED ORDER — ROPIVACAINE HCL 5 MG/ML IJ SOLN
INTRAMUSCULAR | Status: DC | PRN
  Administered 2019-01-29: 30 mL via PERINEURAL
  Administered 2019-01-29: 10 mL via PERINEURAL

## 2019-01-29 MED ORDER — CEFAZOLIN SODIUM-DEXTROSE 2-4 GM/100ML-% IV SOLN
2.0000 g | INTRAVENOUS | Status: AC
  Administered 2019-01-29: 2 g via INTRAVENOUS

## 2019-01-29 MED ORDER — ONDANSETRON HCL 4 MG/2ML IJ SOLN: 4.0000 mg | Freq: Once | INTRAMUSCULAR | Status: DC | PRN

## 2019-01-29 MED ORDER — FENTANYL CITRATE (PF) 100 MCG/2ML IJ SOLN
INTRAMUSCULAR | Status: DC | PRN
  Administered 2019-01-29: 50 ug via INTRAVENOUS

## 2019-01-29 MED ORDER — MIDAZOLAM HCL 2 MG/2ML IJ SOLN: 2.0000 mg | Freq: Once | INTRAMUSCULAR | Status: DC

## 2019-01-29 MED ORDER — ONDANSETRON HCL 4 MG/2ML IJ SOLN
INTRAMUSCULAR | Status: DC | PRN
  Administered 2019-01-29: 4 mg via INTRAVENOUS

## 2019-01-29 MED ORDER — MIDAZOLAM HCL 2 MG/2ML IJ SOLN
INTRAMUSCULAR | Status: AC
  Filled 2019-01-29: qty 2

## 2019-01-29 MED ORDER — 0.9 % SODIUM CHLORIDE (POUR BTL) OPTIME
TOPICAL | Status: DC | PRN
  Administered 2019-01-29: 1000 mL

## 2019-01-29 SURGICAL SUPPLY — 31 items
BLADE SAW SGTL MED 73X18.5 STR (BLADE) IMPLANT
BLADE SURG 21 STRL SS (BLADE) ×3 IMPLANT
BNDG COHESIVE 4X5 TAN STRL (GAUZE/BANDAGES/DRESSINGS) ×3 IMPLANT
BNDG GAUZE ELAST 4 BULKY (GAUZE/BANDAGES/DRESSINGS) ×3 IMPLANT
COVER SURGICAL LIGHT HANDLE (MISCELLANEOUS) ×6 IMPLANT
COVER WAND RF STERILE (DRAPES) ×3 IMPLANT
DRAPE U-SHAPE 47X51 STRL (DRAPES) ×6 IMPLANT
DRSG ADAPTIC 3X8 NADH LF (GAUZE/BANDAGES/DRESSINGS) ×3 IMPLANT
DRSG PAD ABDOMINAL 8X10 ST (GAUZE/BANDAGES/DRESSINGS) ×6 IMPLANT
DURAPREP 26ML APPLICATOR (WOUND CARE) ×3 IMPLANT
ELECT REM PT RETURN 9FT ADLT (ELECTROSURGICAL) ×3
ELECTRODE REM PT RTRN 9FT ADLT (ELECTROSURGICAL) ×1 IMPLANT
GAUZE SPONGE 4X4 12PLY STRL (GAUZE/BANDAGES/DRESSINGS) ×3 IMPLANT
GAUZE SPONGE 4X4 12PLY STRL LF (GAUZE/BANDAGES/DRESSINGS) ×2 IMPLANT
GLOVE BIOGEL PI IND STRL 9 (GLOVE) ×1 IMPLANT
GLOVE BIOGEL PI INDICATOR 9 (GLOVE) ×2
GLOVE SURG ORTHO 9.0 STRL STRW (GLOVE) ×3 IMPLANT
GOWN STRL REUS W/ TWL XL LVL3 (GOWN DISPOSABLE) ×2 IMPLANT
GOWN STRL REUS W/TWL XL LVL3 (GOWN DISPOSABLE) ×6
KIT BASIN OR (CUSTOM PROCEDURE TRAY) ×3 IMPLANT
KIT TURNOVER KIT B (KITS) ×3 IMPLANT
NS IRRIG 1000ML POUR BTL (IV SOLUTION) ×3 IMPLANT
PACK ORTHO EXTREMITY (CUSTOM PROCEDURE TRAY) ×3 IMPLANT
PAD ABD 8X10 STRL (GAUZE/BANDAGES/DRESSINGS) ×2 IMPLANT
PAD ARMBOARD 7.5X6 YLW CONV (MISCELLANEOUS) ×6 IMPLANT
STOCKINETTE IMPERVIOUS LG (DRAPES) IMPLANT
SUT ETHILON 2 0 PSLX (SUTURE) ×5 IMPLANT
TOWEL GREEN STERILE (TOWEL DISPOSABLE) ×3 IMPLANT
TUBE CONNECTING 12'X1/4 (SUCTIONS) ×1
TUBE CONNECTING 12X1/4 (SUCTIONS) ×2 IMPLANT
YANKAUER SUCT BULB TIP NO VENT (SUCTIONS) ×3 IMPLANT

## 2019-01-29 NOTE — Progress Notes (Signed)
Orthopedic Tech Progress Note Patient Details:  Brittany [name] 1945-09-07 [id number]  Ortho Devices Type of Ortho Device: Crutches, Postop shoe/boot Ortho Device/Splint Location: right Ortho Device/Splint Interventions: Application   Post Interventions Patient Tolerated: Well Instructions Provided: Care of device   [place] Pink 01/29/2019, 1:41 PM

## 2019-01-29 NOTE — Anesthesia Procedure Notes (Signed)
Anesthesia Regional Block: Popliteal block   Pre-Anesthetic Checklist: ,, timeout performed, Correct Patient, Correct Site, Correct Laterality, Correct Procedure, Correct Position, site marked, Risks and benefits discussed,  Surgical consent,  Pre-op evaluation,  At surgeon's request and post-op pain management  Laterality: Right  Prep: chloraprep       Needles:  Injection technique: Single-shot  Needle Type: Echogenic Needle     Needle Length: 9cm  Needle Gauge: 21     Additional Needles:   Procedures:,,,, ultrasound used (permanent image in chart),,,,  Narrative:  Start time: 01/29/2019 10:16 AM End time: 01/29/2019 10:21 AM Injection made incrementally with aspirations every 5 mL.  Performed by: Personally  Anesthesiologist: Catalina Gravel, MD  Additional Notes: No pain on injection. No increased resistance to injection. Injection made in 5cc increments.  Good needle visualization.  Patient tolerated procedure well.

## 2019-01-29 NOTE — Op Note (Signed)
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    The risks benefits and alternatives were discussed with the patient preoperatively including but not limited to the risks of infection, bleeding, nerve injury, cardiopulmonary complications, the need for revision surgery, among others, and the patient was willing to proceed.  OPERATIVE IMPLANTS: None  @ENCIMAGES @  OPERATIVE FINDINGS: Good petechial bleeding along the wound edges  OPERATIVE PROCEDURE: Patient was brought the operating room underwent a regional anesthetic.  After adequate levels anesthesia were obtained patient's right lower extremity was prepped using DuraPrep draped into a sterile field a timeout was called.  A fishmouth incision was made at the level of the MTP joints.  A oscillating saw was used to complete the transmetatarsal amputations beveled plantarly.  Electrocautery was used for hemostasis the wound was irrigated with normal saline.  2-0 nylon was used to close the wound.  Sterile dressing was applied patient was taken the PACU in stable condition.   DISCHARGE PLANNING:  Antibiotic duration: Preoperative antibiotics only  Weightbearing: Touchdown weightbearing on the right  Pain medication: Patient states she has Neurontin and morphine at home  Dressing care/ Wound VAC: Follow-up in the office in 1 week to change the dressing  Ambulatory devices: Walker and wheelchair  Discharge to: Home.  Follow-up: In the office 1 week post operative.

## 2019-01-29 NOTE — Anesthesia Postprocedure Evaluation (Signed)
Anesthesia Post Note  Patient: Brittany [name]  Procedure(s) Performed: RIGHT TRANSMETATARSAL AMPUTATION (Right Foot)     Patient location during evaluation: PACU Anesthesia Type: Regional Level of consciousness: awake and alert Pain management: pain level controlled Vital Signs Assessment: post-procedure vital signs reviewed and stable Respiratory status: spontaneous breathing, nonlabored ventilation, respiratory function stable and patient connected to nasal cannula oxygen Cardiovascular status: stable and blood pressure returned to baseline Postop Assessment: no apparent nausea or vomiting Anesthetic complications: no    Last Vitals:  Vitals:   01/29/19 1323 01/29/19 1340  BP: 132/60 126/60  Pulse: (!) 57 (!) 56  Resp: 18 14  Temp:  36.4 C  SpO2: 93% 97%    Last Pain:  Vitals:   01/29/19 1340  TempSrc:   PainSc: 0-No pain        RLE Motor Response: No movement due to regional block (01/29/19 1340) RLE Sensation: No sensation (absent) (01/29/19 1340)      Catalina Gravel

## 2019-01-29 NOTE — H&P (Signed)
Brittany [name] is an 73 y.o. female.   Chief Complaint: Osteomyelitis right foot third toe HPI: The patient is a 73 year old woman with insensate diabetic neuropathy, who has previously undergone a second toe partial amputation and a great toe amputation of the right foot who has now developed osteomyelitis and ulceration of the right foot third toe.  She presents today for right transmetatarsal amputation.  Past Medical History:  Diagnosis Date  . Anemia    as a younger woman  . Arthritis    "all over my body" (10/01/2017)  . Chronic pain    In pain clinic   . Constipation   . Depression   . Diabetic peripheral neuropathy ([place])   . GERD (gastroesophageal reflux disease)   . High cholesterol   . History of hiatal hernia   . Hypertension   . Type II diabetes mellitus ([place]) dx'd ~ 2008    Past Surgical History:  Procedure Laterality Date  . AMPUTATION Right 09/29/2017   Procedure: AMPUTATION RIGHT 1ST RAY;  Surgeon: Leandrew Koyanagi, MD;  Location: [facility];  Service: Orthopedics;  Laterality: Right;  . AMPUTATION TOE Right 04/24/2018   Procedure: RIGHT 2ND TOE PROXIMAL INTERPHALANGEAL JOINT DISARTICULATION;  Surgeon: Leandrew Koyanagi, MD;  Location: [facility];  Service: Orthopedics;  Laterality: Right;  . APPLICATION OF WOUND VAC Right 09/29/2017   "foot"  . COLONOSCOPY    . I&D EXTREMITY Right 08/10/2017   Procedure: IRRIGATION AND DEBRIDEMENT FOOT;  Surgeon: Leandrew Koyanagi, MD;  Location: [facility];  Service: Orthopedics;  Laterality: Right;  . [place]  . POSTERIOR LUMBAR FUSION  1996  . TOE SURGERY Right 1980s   bone removed "inbetween my toes"  . TOTAL HIP ARTHROPLASTY Left 11/16/2018   Procedure: LEFT TOTAL HIP ARTHROPLASTY ANTERIOR APPROACH;  Surgeon: Leandrew Koyanagi, MD;  Location: [facility];  Service: Orthopedics;  Laterality: Left;  [name] VAGINAL HYSTERECTOMY      Family History  Problem Relation Age of Onset  . Cancer Mother        breast  . Depression  Mother        bipolar  . Breast cancer Mother 52  . Heart disease Father 44       MI  . Diabetes Brother   . Kidney disease Brother   . Hypertension Brother   . Hyperlipidemia Brother   . Stroke Brother   . Coronary artery disease Other   . Diabetes Other    Social History:  reports that she has never smoked. She has never used smokeless tobacco. She reports that she does not drink alcohol or use drugs.  Allergies:  Allergies  Allergen Reactions  . Sulfonamide Derivatives Hives    No medications prior to admission.    Results for orders placed or performed during the hospital encounter of 01/28/19 (from the past 48 hour(s))  SARS CORONAVIRUS 2 (TAT 6-24 HRS) Nasopharyngeal Nasopharyngeal Swab     Status: None   Collection Time: 01/28/19  3:08 PM   Specimen: Nasopharyngeal Swab  Result Value Ref Range   SARS Coronavirus 2 NEGATIVE NEGATIVE    Comment: (NOTE) SARS-CoV-2 target nucleic acids are NOT DETECTED. The SARS-CoV-2 RNA is generally detectable in upper and lower respiratory specimens during the acute phase of infection. Negative results do not preclude SARS-CoV-2 infection, do not rule out co-infections with other pathogens, and should not be used as the sole basis for treatment or other patient management decisions. Negative  results must be combined with clinical observations, patient history, and epidemiological information. The expected result is Negative. Fact Sheet for Patients: SugarRoll.be Fact Sheet for Healthcare Providers: https://www.woods-mathews.com/ This test is not yet approved or cleared by the [place] FDA and  has been authorized for detection and/or diagnosis of SARS-CoV-2 by FDA under an Emergency Use Authorization (EUA). This EUA will remain  in effect (meaning this test can be used) for the duration of the COVID-19 declaration under Section 56 4(b)(1) of the Act, 21 U.S.C. section 360bbb-3(b)(1),  unless the authorization is terminated or revoked sooner. Performed at [facility], [place] [address], [place], [place] [address]    No results found.  Review of Systems  All other systems reviewed and are negative.   Height 5\' 9"  (1.753 m), weight 73.5 kg. Physical Exam  Constitutional: She is oriented to person, place, and time. She appears well-developed and well-nourished. No distress.  HENT:  Head: Normocephalic and atraumatic.  Neck: No tracheal deviation present. No thyromegaly present.  Cardiovascular: Normal rate.  Respiratory: Effort normal. No stridor. No respiratory distress.  GI: Soft. She exhibits no distension.  Musculoskeletal:     Comments: Examination patient is a strong dorsalis pedis and posterior tibial pulse.  She has ulceration of the third toe right foot that probes to bone with osteomyelitis.  Patient is status post a great toe amputation as well as a partial amputation of the second toe.  There is sausage digit swelling of the third toe with cellulitis.  Neurological: She is alert and oriented to person, place, and time. No cranial nerve deficit.  Skin: Skin is warm.  Psychiatric: She has a normal mood and affect. Her behavior is normal. Judgment and thought content normal.     Assessment/Plan Osteomyelitis right foot third toe with history of previous second toe partial amputation and right great toe amputations-plan right transmetatarsal amputation-the procedure and benefits and risk including the risk of bleeding, infection, neurovascular injury, and possible need for further surgery were discussed at length with the patient and her questions answered to her satisfaction.  The patient wishes to proceed with surgery at this time.  Erlinda Hong, PA-C 01/29/2019, 7:22 AM [facility] [phone]

## 2019-01-29 NOTE — Anesthesia Preprocedure Evaluation (Signed)
Anesthesia Evaluation  Patient identified by MRN, date of birth, ID band Patient awake    Reviewed: Allergy & Precautions, NPO status , Patient's Chart, lab work & pertinent test results, reviewed documented beta blocker date and time   Airway Mallampati: II  TM Distance: >3 FB Neck ROM: Full    Dental  (+) Dental Advisory Given, Poor Dentition, Missing   Pulmonary neg pulmonary ROS,    Pulmonary exam normal breath sounds clear to auscultation       Cardiovascular hypertension, Pt. on home beta blockers and Pt. on medications + Peripheral Vascular Disease  Normal cardiovascular exam Rhythm:Regular Rate:Normal     Neuro/Psych PSYCHIATRIC DISORDERS Anxiety Depression  Neuromuscular disease    GI/Hepatic Neg liver ROS, hiatal hernia, GERD  Medicated,  Endo/Other  diabetes, Type 2  Renal/GU negative Renal ROS     Musculoskeletal  (+) Arthritis , Osteomyelitis Right 3rd Toe   Abdominal   Peds  Hematology negative hematology ROS (+)   Anesthesia Other Findings Day of surgery medications reviewed with the patient.  Reproductive/Obstetrics                             Anesthesia Physical Anesthesia Plan  ASA: III  Anesthesia Plan:    Post-op Pain Management:    Induction:   PONV Risk Score and Plan:   Airway Management Planned:   Additional Equipment:   Intra-op Plan:   Post-operative Plan:   Informed Consent:   Plan Discussed with:   Anesthesia Plan Comments:         Anesthesia Quick Evaluation

## 2019-01-29 NOTE — Progress Notes (Signed)
Pt c/o left buttocks hurting, RN checked the pt's skin and there appears to be skin breakdown at the left buttocks. Pt states this skin breakdown has been present for weeks. Pt educated to change positions frequently and to make a follow up doctors appoint with their PCP. Pt agreeable to education.

## 2019-01-29 NOTE — Transfer of Care (Signed)
Immediate Anesthesia Transfer of Care Note  Patient: Brittany [name]  Procedure(s) Performed: RIGHT TRANSMETATARSAL AMPUTATION (Right Foot)  Patient Location: PACU  Anesthesia Type:MAC  Level of Consciousness: awake, alert  and oriented  Airway & Oxygen Therapy: Patient connected to face mask oxygen  Post-op Assessment: Post -op Vital signs reviewed and stable  Post vital signs: stable  Last Vitals:  Vitals Value Taken Time  BP 132/60 01/29/19 1323  Temp 36.1 C 01/29/19 1253  Pulse 55 01/29/19 1334  Resp 12 01/29/19 1334  SpO2 94 % 01/29/19 1334  Vitals shown include unvalidated device data.  Last Pain:  Vitals:   01/29/19 1323  TempSrc:   PainSc: 0-No pain      Patients Stated Pain Goal: 3 (29/24/46 2863)  Complications: No apparent anesthesia complications

## 2019-01-29 NOTE — Anesthesia Procedure Notes (Signed)
Anesthesia Regional Block: Adductor canal block   Pre-Anesthetic Checklist: ,, timeout performed, Correct Patient, Correct Site, Correct Laterality, Correct Procedure, Correct Position, site marked, Risks and benefits discussed,  Surgical consent,  Pre-op evaluation,  At surgeon's request and post-op pain management  Laterality: Right  Prep: chloraprep       Needles:  Injection technique: Single-shot  Needle Type: Echogenic Needle     Needle Length: 9cm  Needle Gauge: 21     Additional Needles:   Procedures:,,,, ultrasound used (permanent image in chart),,,,  Narrative:  Start time: 01/29/2019 10:21 AM End time: 01/29/2019 10:25 AM Injection made incrementally with aspirations every 5 mL.  Performed by: Personally  Anesthesiologist: Catalina Gravel, MD  Additional Notes: No pain on injection. No increased resistance to injection. Injection made in 5cc increments.  Good needle visualization.  Patient tolerated procedure well.

## 2019-01-30 ENCOUNTER — Encounter (HOSPITAL_COMMUNITY): Payer: Self-pay | Admitting: Orthopedic Surgery

## 2019-01-30 ENCOUNTER — Other Ambulatory Visit: Payer: Self-pay | Admitting: Family Medicine

## 2019-01-30 DIAGNOSIS — K219 Gastro-esophageal reflux disease without esophagitis: Secondary | ICD-10-CM

## 2019-01-30 DIAGNOSIS — I10 Essential (primary) hypertension: Secondary | ICD-10-CM

## 2019-01-30 DIAGNOSIS — E1110 Type 2 diabetes mellitus with ketoacidosis without coma: Secondary | ICD-10-CM

## 2019-01-30 DIAGNOSIS — E785 Hyperlipidemia, unspecified: Secondary | ICD-10-CM

## 2019-01-30 DIAGNOSIS — E1169 Type 2 diabetes mellitus with other specified complication: Secondary | ICD-10-CM

## 2019-02-05 ENCOUNTER — Telehealth: Payer: Self-pay | Admitting: Orthopedic Surgery

## 2019-02-05 NOTE — Telephone Encounter (Signed)
Patient called wanting to know if she needs to be on an oral antibiotic even though she received an antibiotic injection in the hospital.  CB#[phone].  Thank you.

## 2019-02-05 NOTE — Telephone Encounter (Signed)
Pt is s/p a right transmet on 01/29/19. Called pt to advise that she does not need abx that is given IV prior to surgery. To call with questions. LM ON VM

## 2019-02-08 ENCOUNTER — Other Ambulatory Visit: Payer: Self-pay

## 2019-02-08 ENCOUNTER — Encounter (HOSPITAL_BASED_OUTPATIENT_CLINIC_OR_DEPARTMENT_OTHER): Payer: Medicare HMO | Attending: Internal Medicine | Admitting: Internal Medicine

## 2019-02-08 DIAGNOSIS — Z882 Allergy status to sulfonamides status: Secondary | ICD-10-CM | POA: Diagnosis not present

## 2019-02-08 DIAGNOSIS — Z96642 Presence of left artificial hip joint: Secondary | ICD-10-CM | POA: Insufficient documentation

## 2019-02-08 DIAGNOSIS — K219 Gastro-esophageal reflux disease without esophagitis: Secondary | ICD-10-CM | POA: Diagnosis not present

## 2019-02-08 DIAGNOSIS — F329 Major depressive disorder, single episode, unspecified: Secondary | ICD-10-CM | POA: Diagnosis not present

## 2019-02-08 DIAGNOSIS — E1142 Type 2 diabetes mellitus with diabetic polyneuropathy: Secondary | ICD-10-CM | POA: Diagnosis not present

## 2019-02-08 DIAGNOSIS — R69 Illness, unspecified: Secondary | ICD-10-CM | POA: Diagnosis not present

## 2019-02-08 DIAGNOSIS — E11622 Type 2 diabetes mellitus with other skin ulcer: Secondary | ICD-10-CM | POA: Diagnosis not present

## 2019-02-08 DIAGNOSIS — I10 Essential (primary) hypertension: Secondary | ICD-10-CM | POA: Diagnosis not present

## 2019-02-08 DIAGNOSIS — L89152 Pressure ulcer of sacral region, stage 2: Secondary | ICD-10-CM | POA: Diagnosis not present

## 2019-02-08 DIAGNOSIS — L89150 Pressure ulcer of sacral region, unstageable: Secondary | ICD-10-CM | POA: Diagnosis not present

## 2019-02-09 ENCOUNTER — Encounter: Payer: Self-pay | Admitting: Family

## 2019-02-09 ENCOUNTER — Ambulatory Visit (INDEPENDENT_AMBULATORY_CARE_PROVIDER_SITE_OTHER): Payer: Medicare HMO | Admitting: Family

## 2019-02-09 ENCOUNTER — Ambulatory Visit: Payer: Medicare HMO | Admitting: Orthopaedic Surgery

## 2019-02-09 VITALS — Ht 69.0 in | Wt 162.0 lb

## 2019-02-09 DIAGNOSIS — S31809D Unspecified open wound of unspecified buttock, subsequent encounter: Secondary | ICD-10-CM | POA: Diagnosis not present

## 2019-02-09 DIAGNOSIS — Z89431 Acquired absence of right foot: Secondary | ICD-10-CM

## 2019-02-09 NOTE — Progress Notes (Signed)
Post-Op Visit Note   Patient: Brittany [name]           Date of Birth: 18-Mar-1946           MRN: [id number] Visit Date: 02/09/2019 PCP: Ann Held, DO  Chief Complaint:  Chief Complaint  Patient presents with  . Right Foot - Routine Post Op    01/29/2019 right TMA    HPI:  HPI The patient is a 73 year old woman seen today status post right transmetatarsal amputation on October 23.  She has worked with [employer] in the past for orthotics.  Ortho Exam On examination of the incision this is well approximated sutures this is dry.  There is no surrounding erythema no gaping no drainage no odor.  Visit Diagnoses:  1. History of transmetatarsal amputation of right foot ([place])     Plan: Doing daily Dial soap cleansing.  Dry dressing changes.  Minimize her weightbearing.  She will follow-up in 2 weeks for suture removal.  Discussed custom orthotics with a spacer and fabricated.  Follow-Up Instructions: Return in about 2 weeks (around 02/23/2019).   Imaging: No results found.  Orders:  No orders of the defined types were placed in this encounter.  No orders of the defined types were placed in this encounter.    PMFS History: Patient Active Problem List   Diagnosis Date Noted  . 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 Vaginal prolapse 09/30/2018  . Primary osteoarthritis of left hip 06/10/2018  . Osteomyelitis of second toe of right foot ([place]) 04/24/2018  . Osteomyelitis of right foot ([place]) 04/21/2018  . 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 Callus 01/13/2018  . Generalized anxiety disorder 10/16/2017  . Menopause 10/16/2017  . DM (diabetes mellitus) type II uncontrolled, periph vascular disorder ([place]) 10/16/2017  . Hyperlipidemia associated with type 2 diabetes mellitus ([place]) 10/16/2017  . 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 ([place]) 10/14/2017  . Osteomyelitis of great toe of right foot ([place])  09/25/2017  . Pain in right foot 09/16/2017  . Cellulitis 08/08/2017  . Hypoglycemia without diagnosis of diabetes mellitus 08/08/2017  . Peripheral neuropathy 08/08/2017  . Abscess 08/08/2017  . Hyperlipidemia LDL goal <70 09/22/2016  . Tooth pain 09/03/2012  . Edema 09/03/2012  . Supraclavicular fossa fullness 06/18/2012  . Hair loss 09/18/2010  . Fatigue 09/18/2010  . DYSPEPSIA&OTHER [facility] DISORDERS FUNCTION STOMACH 05/08/2010  . DIARRHEA 05/08/2010  . DIZZINESS 04/19/2010  . OTHER ACUTE SINUSITIS 02/28/2010  . NAUSEA 02/28/2010  . Pain in right ankle and joints of right foot 02/15/2010  . URI 07/09/2007  . GERD 02/12/2007  . LOW BACK PAIN 02/12/2007  . Hypertension 09/03/2006  . HOT FLASHES 09/03/2006  . INSOMNIA 09/03/2006   Past Medical History:  Diagnosis Date  . Anemia    as a younger woman  . Arthritis    "all over my body" (10/01/2017)  . Chronic pain    In pain clinic   . Constipation   . Depression   . Diabetic peripheral neuropathy ([place])   . GERD (gastroesophageal reflux disease)   . High cholesterol   . History of hiatal hernia   . Hypertension   . Type II diabetes mellitus ([place]) dx'd ~ 2008    Family History  Problem Relation Age of Onset  . Cancer Mother        breast  . Depression Mother  bipolar  . Breast cancer Mother 22  . Heart disease Father 42       MI  . Diabetes Brother   . Kidney disease Brother   . Hypertension Brother   . Hyperlipidemia Brother   . Stroke Brother   . Coronary artery disease Other   . Diabetes Other     Past Surgical History:  Procedure Laterality Date  . AMPUTATION Right 09/29/2017   Procedure: AMPUTATION RIGHT 1ST RAY;  Surgeon: Leandrew Koyanagi, MD;  Location: [facility];  Service: Orthopedics;  Laterality: Right;  . AMPUTATION Right 01/29/2019   Procedure: RIGHT TRANSMETATARSAL AMPUTATION;  Surgeon: Newt Minion, MD;  Location: [facility];  Service: Orthopedics;  Laterality: Right;  . AMPUTATION TOE Right  04/24/2018   Procedure: RIGHT 2ND TOE PROXIMAL INTERPHALANGEAL JOINT DISARTICULATION;  Surgeon: Leandrew Koyanagi, MD;  Location: [facility];  Service: Orthopedics;  Laterality: Right;  . APPLICATION OF WOUND VAC Right 09/29/2017   "foot"  . COLONOSCOPY    . I&D EXTREMITY Right 08/10/2017   Procedure: IRRIGATION AND DEBRIDEMENT FOOT;  Surgeon: Leandrew Koyanagi, MD;  Location: [facility];  Service: Orthopedics;  Laterality: Right;  . [place]  . POSTERIOR LUMBAR FUSION  1996  . TOE SURGERY Right 1980s   bone removed "inbetween my toes"  . TOTAL HIP ARTHROPLASTY Left 11/16/2018   Procedure: LEFT TOTAL HIP ARTHROPLASTY ANTERIOR APPROACH;  Surgeon: Leandrew Koyanagi, MD;  Location: [facility];  Service: Orthopedics;  Laterality: Left;  [name] VAGINAL HYSTERECTOMY     Social History   Occupational History  . Not on file  Tobacco Use  . Smoking status: Never Smoker  . Smokeless tobacco: Never Used  Substance and Sexual Activity  . Alcohol use: Never    Frequency: Never  . Drug use: Never  . Sexual activity: Not Currently    Partners: Male

## 2019-02-12 ENCOUNTER — Telehealth: Payer: Self-pay | Admitting: Orthopedic Surgery

## 2019-02-12 NOTE — Telephone Encounter (Signed)
Pt called in said she just had surgery with dr.duda and the shoe she was told to ear for after surgery is causing her a lot of discomfort and pain, also causing her bandages to come off. Pt would like a call back to ask if she could wear a different shoe?  [phone]

## 2019-02-15 ENCOUNTER — Ambulatory Visit (HOSPITAL_BASED_OUTPATIENT_CLINIC_OR_DEPARTMENT_OTHER): Payer: Medicare HMO | Admitting: Internal Medicine

## 2019-02-15 NOTE — Telephone Encounter (Signed)
I called and lm on vm to advise that her post op shoe should not be causing her dressing to come off and she is to be nwtb as she is s/p a transmet amp on 01/29/19 so this should not be causing pain. Would like to see her in the office to eval this to call back and make an appt.

## 2019-02-16 ENCOUNTER — Other Ambulatory Visit: Payer: Self-pay

## 2019-02-16 ENCOUNTER — Encounter: Payer: Self-pay | Admitting: Family

## 2019-02-16 ENCOUNTER — Ambulatory Visit (INDEPENDENT_AMBULATORY_CARE_PROVIDER_SITE_OTHER): Payer: Medicare HMO | Admitting: Family

## 2019-02-16 VITALS — Ht 69.0 in | Wt 162.0 lb

## 2019-02-16 DIAGNOSIS — Z89431 Acquired absence of right foot: Secondary | ICD-10-CM | POA: Diagnosis not present

## 2019-02-16 NOTE — Progress Notes (Signed)
Post-Op Visit Note   Patient: Brittany [name]           Date of Birth: [birth year]           MRN: [id number] Visit Date: 02/16/2019 PCP: Ann Held, DO  Chief Complaint:  Chief Complaint  Patient presents with  . Right Foot - Routine Post Op    01/29/19 right transmet amputation     HPI:  HPI The patient is a 73 year old woman who presents today 2 weeks status post right transmetatarsal amputation sutures remain in place she is in a wheelchair for mobility however she does report when she ambulates in her postop shoe she has difficulty this is loose she is twisted her ankle because of it and had many falls.  She is requesting a postop shoe. Ortho Exam On examination of the right foot the incision is well approximated sutures this is well-healed there is no open area no drainage no erythema very mild swelling.  Visit Diagnoses:  1. History of transmetatarsal amputation of right foot ([place])     Plan: Sutures harvested without incident.  She will continue dry dressing changes.  I did provide another order for a custom orthotic with a spacer as well as a carbon fiber plate she will follow-up in the office in 2 weeks she will slowly advance her weightbearing.  She was given a new postop shoe.  Follow-Up Instructions: Return in about 2 weeks (around 03/02/2019).   Imaging: No results found.  Orders:  No orders of the defined types were placed in this encounter.  No orders of the defined types were placed in this encounter.    PMFS History: Patient Active Problem List   Diagnosis Date Noted  . 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 Vaginal prolapse 09/30/2018  . Primary osteoarthritis of left hip 06/10/2018  . Osteomyelitis of second toe of right foot ([place]) 04/24/2018  . Osteomyelitis of right foot () 04/21/2018  . 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 Callus 01/13/2018  . Generalized anxiety disorder 10/16/2017  .  Menopause 10/16/2017  . DM (diabetes mellitus) type II uncontrolled, periph vascular disorder ([place]) 10/16/2017  . Hyperlipidemia associated with type 2 diabetes mellitus ([place]) 10/16/2017  . 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 ([place]) 10/14/2017  . Osteomyelitis of great toe of right foot ([place]) 09/25/2017  . Pain in right foot 09/16/2017  . Cellulitis 08/08/2017  . Hypoglycemia without diagnosis of diabetes mellitus 08/08/2017  . Peripheral neuropathy 08/08/2017  . Abscess 08/08/2017  . Hyperlipidemia LDL goal <70 09/22/2016  . Tooth pain 09/03/2012  . Edema 09/03/2012  . Supraclavicular fossa fullness 06/18/2012  . Hair loss 09/18/2010  . Fatigue 09/18/2010  . DYSPEPSIA&OTHER [facility] DISORDERS FUNCTION STOMACH 05/08/2010  . DIARRHEA 05/08/2010  . DIZZINESS 04/19/2010  . OTHER ACUTE SINUSITIS 02/28/2010  . NAUSEA 02/28/2010  . Pain in right ankle and joints of right foot 02/15/2010  . URI 07/09/2007  . GERD 02/12/2007  . LOW BACK PAIN 02/12/2007  . Hypertension 09/03/2006  . HOT FLASHES 09/03/2006  . INSOMNIA 09/03/2006   Past Medical History:  Diagnosis Date  . Anemia    as a younger woman  . Arthritis    "all over my body" (10/01/2017)  . Chronic pain    In pain clinic   . Constipation   . Depression   . Diabetic peripheral neuropathy ([place])   . GERD (gastroesophageal reflux  disease)   . High cholesterol   . History of hiatal hernia   . Hypertension   . Type II diabetes mellitus ([place]) dx'd ~ 2008    Family History  Problem Relation Age of Onset  . Cancer Mother        breast  . Depression Mother        bipolar  . Breast cancer Mother 75  . Heart disease Father 53       MI  . Diabetes Brother   . Kidney disease Brother   . Hypertension Brother   . Hyperlipidemia Brother   . Stroke Brother   . Coronary artery disease Other   . Diabetes Other     Past Surgical History:  Procedure Laterality Date  . AMPUTATION Right 09/29/2017    Procedure: AMPUTATION RIGHT 1ST RAY;  Surgeon: Leandrew Koyanagi, MD;  Location: [facility];  Service: Orthopedics;  Laterality: Right;  . AMPUTATION Right 01/29/2019   Procedure: RIGHT TRANSMETATARSAL AMPUTATION;  Surgeon: Newt Minion, MD;  Location: [place];  Service: Orthopedics;  Laterality: Right;  . AMPUTATION TOE Right 04/24/2018   Procedure: RIGHT 2ND TOE PROXIMAL INTERPHALANGEAL JOINT DISARTICULATION;  Surgeon: Leandrew Koyanagi, MD;  Location: [place];  Service: Orthopedics;  Laterality: Right;  . APPLICATION OF WOUND VAC Right 09/29/2017   "foot"  . COLONOSCOPY    . I&D EXTREMITY Right 08/10/2017   Procedure: IRRIGATION AND DEBRIDEMENT FOOT;  Surgeon: Leandrew Koyanagi, MD;  Location: [facility];  Service: Orthopedics;  Laterality: Right;  . [place]  . POSTERIOR LUMBAR FUSION  1996  . TOE SURGERY Right 1980s   bone removed "inbetween my toes"  . TOTAL HIP ARTHROPLASTY Left 11/16/2018   Procedure: LEFT TOTAL HIP ARTHROPLASTY ANTERIOR APPROACH;  Surgeon: Leandrew Koyanagi, MD;  Location: [facility];  Service: Orthopedics;  Laterality: Left;  [name] VAGINAL HYSTERECTOMY     Social History   Occupational History  . Not on file  Tobacco Use  . Smoking status: Never Smoker  . Smokeless tobacco: Never Used  Substance and Sexual Activity  . Alcohol use: Never    Frequency: Never  . Drug use: Never  . Sexual activity: Not Currently    Partners: Male

## 2019-02-19 ENCOUNTER — Telehealth: Payer: Self-pay | Admitting: Family Medicine

## 2019-02-19 NOTE — Telephone Encounter (Signed)
**  Patient calling and states that Dr Etter Sjogren mentioned at one point that she could refill this medication. Patient states that she has had all her toes on one foot amputated. States that she was unable to get in touch with the doctor that normally prescribes this. Would like ot know if Dr Etter Sjogren could prescribe?**   Medication Refill - Medication: morphine (MS CONTIN) 60 MG 12 hr tablet   Has the patient contacted their pharmacy? Yes.   (Agent: If no, request that the patient contact the pharmacy for the refill.) (Agent: If yes, when and what did the pharmacy advise?)  Preferred Pharmacy (with phone number or street name): [facility] [place], [place]  Agent: Please be advised that RX refills may take up to 3 business days. We ask that you follow-up with your pharmacy.

## 2019-02-22 NOTE — Telephone Encounter (Signed)
Are you willing to take over refills for this?

## 2019-02-22 NOTE — Telephone Encounter (Signed)
Not morphine---  She just had surgery so she really needs to f/u with the surgeon

## 2019-02-22 NOTE — Telephone Encounter (Signed)
Patient notified that we are unable to do.  She will Dentist.

## 2019-02-23 ENCOUNTER — Ambulatory Visit: Payer: Medicare HMO | Admitting: Family

## 2019-03-02 ENCOUNTER — Encounter: Payer: Self-pay | Admitting: Family

## 2019-03-02 ENCOUNTER — Ambulatory Visit (INDEPENDENT_AMBULATORY_CARE_PROVIDER_SITE_OTHER): Payer: Medicare HMO | Admitting: Family

## 2019-03-02 ENCOUNTER — Other Ambulatory Visit: Payer: Self-pay

## 2019-03-02 VITALS — Ht 69.0 in | Wt 162.0 lb

## 2019-03-02 DIAGNOSIS — Z89431 Acquired absence of right foot: Secondary | ICD-10-CM

## 2019-03-02 NOTE — Progress Notes (Signed)
Post-Op Visit Note   Patient: Brittany [name]           Date of Birth: 09-29-1945           MRN: [id number] Visit Date: 03/02/2019 PCP: Ann Held, DO  Chief Complaint:  Chief Complaint  Patient presents with  . Right Foot - Routine Post Op    01/29/2019 right TMA    HPI:  HPI The patient is a 73 year old woman who is seen today status post right transmetatarsal amputation.  She is in regular shoewear and is well-healed she is got a sock in her shoe wear.  She has new new balance extrawide walking shoes Ortho Exam Patient is well-healed does have a little buildup of callus this was debrided.  There is no erythema no edema no open ulcers  Visit Diagnoses:  1. History of transmetatarsal amputation of right foot ([place])     Plan: Reassurance provided as her amputation is well-healed.  Discussed shoewear options as well as a custom orthotic with a spacer.  At this time patient would like to continue with her sock.  She also would like to proceed with physical therapy for gait training.  Instability.  Follow-Up Instructions: Return if symptoms worsen or fail to improve.   Imaging: No results found.  Orders:  No orders of the defined types were placed in this encounter.  No orders of the defined types were placed in this encounter.    PMFS History: Patient Active Problem List   Diagnosis Date Noted  . 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 Vaginal prolapse 09/30/2018  . Primary osteoarthritis of left hip 06/10/2018  . Osteomyelitis of second toe of right foot ([place]) 04/24/2018  . Osteomyelitis of right foot ([place]) 04/21/2018  . 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 Callus 01/13/2018  . Generalized anxiety disorder 10/16/2017  . Menopause 10/16/2017  . DM (diabetes mellitus) type II uncontrolled, periph vascular disorder ([place]) 10/16/2017  . Hyperlipidemia associated with type 2 diabetes mellitus ([place]) 10/16/2017  .  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 ([place]) 10/14/2017  . Osteomyelitis of great toe of right foot ([place]) 09/25/2017  . Pain in right foot 09/16/2017  . Cellulitis 08/08/2017  . Hypoglycemia without diagnosis of diabetes mellitus 08/08/2017  . Peripheral neuropathy 08/08/2017  . Abscess 08/08/2017  . Hyperlipidemia LDL goal <70 09/22/2016  . Tooth pain 09/03/2012  . Edema 09/03/2012  . Supraclavicular fossa fullness 06/18/2012  . Hair loss 09/18/2010  . Fatigue 09/18/2010  . DYSPEPSIA&OTHER [facility] DISORDERS FUNCTION STOMACH 05/08/2010  . DIARRHEA 05/08/2010  . DIZZINESS 04/19/2010  . OTHER ACUTE SINUSITIS 02/28/2010  . NAUSEA 02/28/2010  . Pain in right ankle and joints of right foot 02/15/2010  . URI 07/09/2007  . GERD 02/12/2007  . LOW BACK PAIN 02/12/2007  . Hypertension 09/03/2006  . HOT FLASHES 09/03/2006  . INSOMNIA 09/03/2006   Past Medical History:  Diagnosis Date  . Anemia    as a younger woman  . Arthritis    "all over my body" (10/01/2017)  . Chronic pain    In pain clinic   . Constipation   . Depression   . Diabetic peripheral neuropathy ([place])   . GERD (gastroesophageal reflux disease)   . High cholesterol   . History of hiatal hernia   . Hypertension   . Type II diabetes mellitus ([place]) dx'd ~ 2008    Family History  Problem Relation Age of Onset  . Cancer Mother        breast  . Depression Mother        bipolar  . Breast cancer Mother 56  . Heart disease Father 20       MI  . Diabetes Brother   . Kidney disease Brother   . Hypertension Brother   . Hyperlipidemia Brother   . Stroke Brother   . Coronary artery disease Other   . Diabetes Other     Past Surgical History:  Procedure Laterality Date  . AMPUTATION Right 09/29/2017   Procedure: AMPUTATION RIGHT 1ST RAY;  Surgeon: Leandrew Koyanagi, MD;  Location: [place];  Service: Orthopedics;  Laterality: Right;  . AMPUTATION Right 01/29/2019   Procedure: RIGHT TRANSMETATARSAL  AMPUTATION;  Surgeon: Newt Minion, MD;  Location: [facility];  Service: Orthopedics;  Laterality: Right;  . AMPUTATION TOE Right 04/24/2018   Procedure: RIGHT 2ND TOE PROXIMAL INTERPHALANGEAL JOINT DISARTICULATION;  Surgeon: Leandrew Koyanagi, MD;  Location: [facility];  Service: Orthopedics;  Laterality: Right;  . APPLICATION OF WOUND VAC Right 09/29/2017   "foot"  . COLONOSCOPY    . I&D EXTREMITY Right 08/10/2017   Procedure: IRRIGATION AND DEBRIDEMENT FOOT;  Surgeon: Leandrew Koyanagi, MD;  Location: [facility];  Service: Orthopedics;  Laterality: Right;  . [place]  . POSTERIOR LUMBAR FUSION  1996  . TOE SURGERY Right 1980s   bone removed "inbetween my toes"  . TOTAL HIP ARTHROPLASTY Left 11/16/2018   Procedure: LEFT TOTAL HIP ARTHROPLASTY ANTERIOR APPROACH;  Surgeon: Leandrew Koyanagi, MD;  Location: [facility];  Service: Orthopedics;  Laterality: Left;  [name] VAGINAL HYSTERECTOMY     Social History   Occupational History  . Not on file  Tobacco Use  . Smoking status: Never Smoker  . Smokeless tobacco: Never Used  Substance and Sexual Activity  . Alcohol use: Never    Frequency: Never  . Drug use: Never  . Sexual activity: Not Currently    Partners: Male

## 2019-03-08 ENCOUNTER — Other Ambulatory Visit: Payer: Self-pay | Admitting: Family Medicine

## 2019-03-08 DIAGNOSIS — I10 Essential (primary) hypertension: Secondary | ICD-10-CM

## 2019-03-08 MED ORDER — KLOR-CON M20 20 MEQ PO TBCR: 20.0000 meq | EXTENDED_RELEASE_TABLET | Freq: Every day | ORAL | 0 refills | Status: DC

## 2019-03-08 NOTE — Telephone Encounter (Signed)
Requested medication (s) are due for refill today: ys  Requested medication (s) are on the active medication list: yes  Last refill:  03/02/2019  Future visit scheduled:yes  Notes to clinic:  Patient has appointment on 03/18/2019   Requested Prescriptions  Pending Prescriptions Disp Refills   KLOR-CON M20 20 MEQ tablet 30 tablet 0    Sig: Take 1 tablet (20 mEq total) by mouth daily.     Endocrinology:  Minerals - Potassium Supplementation Passed - 03/08/2019  8:45 AM      Passed - K in normal range and within 360 days    Potassium  Date Value Ref Range Status  01/29/2019 3.5 3.5 - 5.1 mmol/L Final         Passed - Cr in normal range and within 360 days    Creatinine, Ser  Date Value Ref Range Status  01/29/2019 0.75 0.44 - 1.00 mg/dL Final         Passed - Valid encounter within last 12 months    Recent Outpatient Visits          10 months ago Edema, unspecified type   [facility], DO   1 year ago Gastroesophageal reflux disease, esophagitis presence not specified   [facility], DO   1 year ago Lower extremity edema   [facility], DO   1 year ago Lower extremity edema   [facility], DO   1 year ago DM (diabetes mellitus) type II uncontrolled, periph vascular disorder ([place])   [facility], DO      Future Appointments            In 1 week [place], DO [employer] [facility] [employer] [facility], [place]   In 2 months Suzan Slick, NP [place]

## 2019-03-08 NOTE — Telephone Encounter (Signed)
Medication Refill - Medication: KLOR-CON M20 20 MEQ tablet  Has the patient contacted their pharmacy? yes (Agent: If no, request that the patient contact the pharmacy for the refill.) (Agent: If yes, when and what did the pharmacy advise?)  Preferred Pharmacy (with phone number or street name):  [facility], [place] [phone] (Phone) [phone] (Fax)   Agent: Please be advised that RX refills may take up to 3 business days. We ask that you follow-up with your pharmacy. 3

## 2019-03-10 ENCOUNTER — Other Ambulatory Visit: Payer: Self-pay | Admitting: Family Medicine

## 2019-03-10 DIAGNOSIS — E1110 Type 2 diabetes mellitus with ketoacidosis without coma: Secondary | ICD-10-CM

## 2019-03-10 DIAGNOSIS — I10 Essential (primary) hypertension: Secondary | ICD-10-CM

## 2019-03-10 DIAGNOSIS — E1169 Type 2 diabetes mellitus with other specified complication: Secondary | ICD-10-CM

## 2019-03-10 DIAGNOSIS — E785 Hyperlipidemia, unspecified: Secondary | ICD-10-CM

## 2019-03-10 DIAGNOSIS — K219 Gastro-esophageal reflux disease without esophagitis: Secondary | ICD-10-CM

## 2019-03-10 DIAGNOSIS — F411 Generalized anxiety disorder: Secondary | ICD-10-CM

## 2019-03-11 DIAGNOSIS — R69 Illness, unspecified: Secondary | ICD-10-CM | POA: Diagnosis not present

## 2019-03-17 NOTE — Progress Notes (Signed)
Brittany [name], Brittany [name] ([id number]) Visit Report for 02/08/2019 Debridement Details Patient Name: Date of Service: Brittany [name], Brittany [name] 02/08/2019 2:45 PM Medical Record [id number] Patient Account Number: [id number] Date of Birth/Sex: 02-04-1946 (72 y.o. Female) Treating RN: Levan Hurst Primary Care Provider: Carollee Herter, Kendrick Fries Other Clinician: Referring Provider: Treating Provider/Extender:Robson, Tennis Ship, Tora Kindred in Treatment: 0 Debridement Performed for Wound #1 Sacrum Assessment: Performed By: Physician Ricard Dillon., MD Debridement Type: Debridement Level of Consciousness (Pre- Awake and Alert procedure): Pre-procedure Yes - 16:08 Verification/Time Out Taken: Start Time: 16:08 Total Area Debrided (L x W): 1.1 (cm) x 0.3 (cm) = 0.33 (cm) Tissue and other material Viable, Non-Viable, Slough, Subcutaneous, Slough debrided: Level: Skin/Subcutaneous Tissue Debridement Description: Excisional Instrument: Curette Bleeding: Minimum Hemostasis Achieved: Pressure End Time: 16:08 Procedural Pain: 0 Post Procedural Pain: 0 Response to Treatment: Procedure was tolerated well Level of Consciousness Awake and Alert (Post-procedure): Post Debridement Measurements of Total Wound Length: (cm) 1.1 Stage: Unstageable/Unclassified Width: (cm) 0.3 Depth: (cm) 0.2 Volume: (cm) 0.052 Character of Wound/Ulcer Post Improved Debridement: Post Procedure Diagnosis Same as Pre-procedure Electronic Signature(s) Signed: 02/08/2019 5:57:33 PM By: Linton Ham MD Signed: 02/08/2019 6:00:40 PM By: Levan Hurst RN, BSN Entered By: Linton Ham on 02/08/2019 16:21:32 -------------------------------------------------------------------------------- HPI Details Patient Name: Date of Service: Brittany [name] 02/08/2019 2:45 PM Medical Record [id number] Patient Account Number: [id number] Date of Birth/Sex: Treating RN: 1946-02-19 (73 y.o. Female) Levan Hurst Primary Care Provider: Roma Schanz Other Clinician: Referring Provider: Treating Provider/Extender:Robson, Enedina Finner Weeks in Treatment: 0 History of Present Illness HPI Description: ADMISSION 02/08/2019 This is a 73 year old woman who had a left total hip replacement in August of this year. Subsequently she had to have a transmetatarsal amputation i.e. removal of her remaining 4 toes by Dr. Sharol Given on 01/29/2019. Because of this she has become relatively immobilized. She also sleeps in a recliner. She developed some pain in this area several months ago and is developed a small wound over her lower coccyx perhaps as long as 6 months ago. She lives alone she has been using Desitin on this area. 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 Electronic Signature(s) Signed: 02/08/2019 5:57:33 PM By: Linton Ham MD Entered By: Linton Ham on 02/08/2019 16:25:20 -------------------------------------------------------------------------------- Physical Exam Details Patient Name: Date of Service: Brittany [name], Brittany [name] 02/08/2019 2:45 PM Medical Record [id number] Patient Account Number: [id number] Date of Birth/Sex: Treating RN: [birth year] (73 y.o. Female) Levan Hurst Primary Care Provider: Roma Schanz Other Clinician: Referring Provider: Treating Provider/Extender:Robson, Lennie Odor CHASE, Tora Kindred in Treatment: 0 Constitutional Sitting or standing Blood Pressure is within target range for patient.. Pulse regular and within target range for patient.[name] Respirations regular, non-labored and within target range.. Temperature is normal and within the target range for the patient.[name] Appears in no distress. Eyes Conjunctivae clear. No  discharge.no icterus. Respiratory work of breathing is normal. Bilateral breath sounds are clear and equal in all lobes with no wheezes, rales or rhonchi.. Cardiovascular Heart rhythm and rate regular, without murmur or gallop.. Gastrointestinal (GI) Abdomen is soft and non-distended without masses or tenderness.. Genitourinary (GU) Bladder is not distended no tenderness. Integumentary (Hair, Skin) No erythema around the wound. Psychiatric Patient appears depressed today.. Notes Wound exam; the patient has a small area right at the tip of her coccyx. This was covered and adherent debris  which I removed with some difficulty with a #3 curette. Hemostasis with direct pressure. Afterwards this is superficial but again in a difficult wound area in terms of trying to apply a dressing on her own. She states she has a bladder prolapse I did not actually see this today. Her right foot which is the previous transmetatarsal amputation site from last week is wrapped. We did not remove this today Electronic Signature(s) Signed: 02/08/2019 5:57:33 PM By: Linton Ham MD Entered By: Linton Ham on 02/08/2019 16:27:02 -------------------------------------------------------------------------------- Physician Orders Details Patient Name: Date of Service: Brittany [name], Brittany [name] 02/08/2019 2:45 PM Medical Record [id number] Patient Account Number: [id number] Date of Birth/Sex: Treating RN: 1945/09/01 (73 y.o. Female) Levan Hurst Primary Care Provider: Roma Schanz Other Clinician: Referring Provider: Treating Provider/Extender:Robson, Tennis Ship, Tora Kindred in Treatment: 0 Verbal / Phone Orders: No Diagnosis Coding Follow-up Appointments Return Appointment in 1 week. Dressing Change Frequency Wound #1 Sacrum Change dressing every day. Wound Cleansing Wound #1 Sacrum May shower and wash wound with soap and water. Primary Wound Dressing Wound #1 Sacrum Calcium  Alginate with Silver Secondary Dressing Wound #1 Sacrum Foam Border Off-Loading Turn and reposition every 2 hours Electronic Signature(s) Signed: 02/08/2019 5:57:33 PM By: Linton Ham MD Signed: 02/08/2019 6:00:40 PM By: Levan Hurst RN, BSN Entered By: Levan Hurst on 02/08/2019 16:16:47 -------------------------------------------------------------------------------- Problem List Details Patient Name: Date of Service: Brittany [name], Brittany [name] 02/08/2019 2:45 PM Medical Record [id number] Patient Account Number: [id number] Date of Birth/Sex: Treating RN: Dec 16, 1945 (73 y.o. Female) Levan Hurst Primary Care Provider: Roma Schanz Other Clinician: Referring Provider: Treating Provider/Extender:Robson, Enedina Finner Weeks in Treatment: 0 Active Problems ICD-10 Evaluated Encounter Code Description Active Date Today Diagnosis L89.152 Pressure ulcer of sacral region, stage 2 02/08/2019 No Yes Inactive Problems Resolved Problems Electronic Signature(s) Signed: 02/08/2019 5:57:33 PM By: Linton Ham MD Entered By: Linton Ham on 02/08/2019 16:21:17 -------------------------------------------------------------------------------- Progress Note Details Patient Name: Date of Service: Brittany [name] 02/08/2019 2:45 PM Medical Record [id number] Patient Account Number: [id number] Date of Birth/Sex: Treating RN: 12-Apr-1945 (73 y.o. Female) Levan Hurst Primary Care Provider: Roma Schanz Other Clinician: Referring Provider: Treating Provider/Extender:Robson, Enedina Finner Weeks in Treatment: 0 Subjective History of Present Illness (HPI) ADMISSION 02/08/2019 This is a 73 year old woman who had a left total hip replacement in August of this year. Subsequently she had to have a transmetatarsal amputation i.e. removal of her remaining 4 toes by Dr. Sharol Given on 01/29/2019. Because of this she has become relatively immobilized.  She also sleeps in a recliner. She developed some pain in this area several months ago and is developed a small wound over her lower coccyx perhaps as long as 6 months ago. She lives alone she has been using Desitin on this area. 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 Patient History Unable to Obtain Patient History due to Altered Mental Status. Information obtained from Patient. Allergies Sulfa (Sulfonamide Antibiotics) Family History Cancer - Mother, Diabetes - Mother,Siblings, Stroke - Siblings, Thyroid Problems - Mother, No family history of Heart Disease, Hereditary Spherocytosis, Hypertension, Kidney Disease, Lung Disease, Seizures, Tuberculosis. Social History Never smoker, Marital Status - Widowed, Alcohol Use - Never, Drug Use - No History, Caffeine Use - Moderate. Medical History Eyes Denies history of Cataracts, Glaucoma, Optic Neuritis Ear/Nose/Mouth/Throat Denies history of Chronic sinus problems/congestion, Middle ear problems  Hematologic/Lymphatic Denies history of Anemia, Hemophilia, Human Immunodeficiency Virus, Lymphedema, Sickle Cell Disease Respiratory Denies history of Aspiration, Asthma, Chronic Obstructive Pulmonary Disease (COPD), Pneumothorax, Sleep Apnea, Tuberculosis Cardiovascular Patient has history of Hypertension Denies history of Angina, Arrhythmia, Congestive Heart Failure, Coronary Artery Disease, Deep Vein Thrombosis, Hypotension, Myocardial Infarction, Peripheral Arterial Disease, Peripheral Venous Disease, Phlebitis, Vasculitis Gastrointestinal Denies history of Cirrhosis , Colitis, Crohnoos, Hepatitis A, Hepatitis B, Hepatitis C Endocrine Patient has history of Type II Diabetes Denies history of Type I Diabetes Genitourinary Denies  history of End Stage Renal Disease Immunological Denies history of Lupus Erythematosus, Raynaudoos Integumentary (Skin) Denies history of History of Burn Musculoskeletal Denies history of Gout, Rheumatoid Arthritis, Osteoarthritis, Osteomyelitis Neurologic Denies history of Dementia, Neuropathy, Quadriplegia, Paraplegia, Seizure Disorder Oncologic Denies history of Received Chemotherapy, Received Radiation Psychiatric Denies history of Anorexia/bulimia, Confinement Anxiety Patient is treated with Oral Agents. Blood sugar is not tested. Review of Systems (ROS) Constitutional Symptoms (General Health) Denies complaints or symptoms of Fatigue, Fever, Chills, Marked Weight Change. Eyes Denies complaints or symptoms of Dry Eyes, Vision Changes, Glasses / Contacts. Ear/Nose/Mouth/Throat Denies complaints or symptoms of Chronic sinus problems or rhinitis. Respiratory Denies complaints or symptoms of Chronic or frequent coughs, Shortness of Breath. Cardiovascular Denies complaints or symptoms of Chest pain. Gastrointestinal Denies complaints or symptoms of Frequent diarrhea, Nausea, Vomiting. Endocrine Denies complaints or symptoms of Heat/cold intolerance. Genitourinary Denies complaints or symptoms of Frequent urination. Integumentary (Skin) Complains or has symptoms of Wounds. Musculoskeletal Denies complaints or symptoms of Muscle Pain, Muscle Weakness. Neurologic Denies complaints or symptoms of Numbness/parasthesias. Psychiatric Denies complaints or symptoms of Claustrophobia, Suicidal. Objective Constitutional Sitting or standing Blood Pressure is within target range for patient.. Pulse regular and within target range for patient.[name] Respirations regular, non-labored and within target range.. Temperature is normal and within the target range for the patient.[name] Appears in no distress. Vitals Time Taken: 3:28 PM, Height: 70 in, Source: Stated, Weight: 162 lbs, Source: Stated,  BMI: 23.2, Temperature: 98.2 F, Pulse: 70 bpm, Respiratory Rate: 18 breaths/min, Blood Pressure: 135/52 mmHg. Eyes Conjunctivae clear. No discharge.no icterus. Respiratory work of breathing is normal. Bilateral breath sounds are clear and equal in all lobes with no wheezes, rales or rhonchi.. Cardiovascular Heart rhythm and rate regular, without murmur or gallop.. Gastrointestinal (GI) Abdomen is soft and non-distended without masses or tenderness.. Genitourinary (GU) Bladder is not distended no tenderness. Psychiatric Patient appears depressed today.. General Notes: Wound exam; the patient has a small area right at the tip of her coccyx. This was covered and adherent debris which I removed with some difficulty with a #3 curette. Hemostasis with direct pressure. Afterwards this is superficial but again in a difficult wound area in terms of trying to apply a dressing on her own. She states she has a bladder prolapse I did not actually see this today. Her right foot which is the previous transmetatarsal amputation site from last week is wrapped. We did not remove this today Integumentary (Hair, Skin) No erythema around the wound. Wound #1 status is Open. Original cause of wound was Gradually Appeared. The wound is located on the Sacrum. The wound measures 1.1cm length x 0.3cm width x 0.2cm depth; 0.259cm^2 area and 0.052cm^3 volume. There is Fat Layer (Subcutaneous Tissue) Exposed exposed. There is no tunneling or undermining noted. There is a medium amount of serosanguineous drainage noted. The wound margin is flat and intact. There is no granulation within the wound bed. There is a large (67-100%) amount of necrotic  tissue within the wound bed including Adherent Slough. Assessment Active Problems ICD-10 Pressure ulcer of sacral region, stage 2 Procedures Wound #1 Pre-procedure diagnosis of Wound #1 is a Pressure Ulcer located on the Sacrum . There was a  Excisional Skin/Subcutaneous Tissue Debridement with a total area of 0.33 sq cm performed by Ricard Dillon., MD. With the following instrument(s): Curette to remove Viable and Non-Viable tissue/material. Material removed includes Subcutaneous Tissue and Slough and. No specimens were taken. A time out was conducted at 16:08, prior to the start of the procedure. A Minimum amount of bleeding was controlled with Pressure. The procedure was tolerated well with a pain level of 0 throughout and a pain level of 0 following the procedure. Post Debridement Measurements: 1.1cm length x 0.3cm width x 0.2cm depth; 0.052cm^3 volume. Post debridement Stage noted as Unstageable/Unclassified. Character of Wound/Ulcer Post Debridement is improved. Post procedure Diagnosis Wound #1: Same as Pre-Procedure Plan Follow-up Appointments: Return Appointment in 1 week. Dressing Change Frequency: Wound #1 Sacrum: Change dressing every day. Wound Cleansing: Wound #1 Sacrum: May shower and wash wound with soap and water. Primary Wound Dressing: Wound #1 Sacrum: Calcium Alginate with Silver Secondary Dressing: Wound #1 Sacrum: Foam Border Off-Loading: Turn and reposition every 2 hours 1. After some discussion we elected to put silver alginate with border foam over this area. I think the patient should be able to do this through self. Will likely need to be changed daily. 2. We talked a little about pressure relief including the advisability to not sleep in a recliner 3. Right foot is wrapped we did not look at this today. 4. She is mobile which should allow her to keep the pressure off this area. I have asked her to get out of the recliner and sleep in the bed try to position herself off her lower back when in bed and when sitting Electronic Signature(s) Signed: 02/08/2019 5:57:33 PM By: Linton Ham MD Entered By: Linton Ham on 02/08/2019  16:28:26 -------------------------------------------------------------------------------- HxROS Details Patient Name: Date of Service: Brittany [name] 02/08/2019 2:45 PM Medical Record [id number] Patient Account Number: [id number] Date of Birth/Sex: Treating RN: 02-25-1946 (74 y.o. Female) Carlene Coria Primary Care Provider: Carollee Herter, Kendrick Fries Other Clinician: Referring Provider: Treating Provider/Extender:Robson, Tennis Ship, Tora Kindred in Treatment: 0 Unable to Obtain Patient History due to Altered Mental Status Information Obtained From Patient Constitutional Symptoms (General Health) Complaints and Symptoms: Negative for: Fatigue; Fever; Chills; Marked Weight Change Eyes Complaints and Symptoms: Negative for: Dry Eyes; Vision Changes; Glasses / Contacts Medical History: Negative for: Cataracts; Glaucoma; Optic Neuritis Ear/Nose/Mouth/Throat Complaints and Symptoms: Negative for: Chronic sinus problems or rhinitis Medical History: Negative for: Chronic sinus problems/congestion; Middle ear problems Respiratory Complaints and Symptoms: Negative for: Chronic or frequent coughs; Shortness of Breath Medical History: Negative for: Aspiration; Asthma; Chronic Obstructive Pulmonary Disease (COPD); Pneumothorax; Sleep Apnea; Tuberculosis Cardiovascular Complaints and Symptoms: Negative for: Chest pain Medical History: Positive for: Hypertension Negative for: Angina; Arrhythmia; Congestive Heart Failure; Coronary Artery Disease; Deep Vein Thrombosis; Hypotension; Myocardial Infarction; Peripheral Arterial Disease; Peripheral Venous Disease; Phlebitis; Vasculitis Gastrointestinal Complaints and Symptoms: Negative for: Frequent diarrhea; Nausea; Vomiting Medical History: Negative for: Cirrhosis ; Colitis; Crohns; Hepatitis A; Hepatitis B; Hepatitis C Endocrine Complaints and Symptoms: Negative for: Heat/cold intolerance Medical History: Positive for: Type  II Diabetes Negative for: Type I Diabetes Time with diabetes: 12 years Treated with: Oral agents Blood sugar tested every day: No Genitourinary Complaints and Symptoms: Negative for: Frequent urination Medical History: Negative  for: End Stage Renal Disease Integumentary (Skin) Complaints and Symptoms: Positive for: Wounds Medical History: Negative for: History of Burn Musculoskeletal Complaints and Symptoms: Negative for: Muscle Pain; Muscle Weakness Medical History: Negative for: Gout; Rheumatoid Arthritis; Osteoarthritis; Osteomyelitis Neurologic Complaints and Symptoms: Negative for: Numbness/parasthesias Medical History: Negative for: Dementia; Neuropathy; Quadriplegia; Paraplegia; Seizure Disorder Psychiatric Complaints and Symptoms: Negative for: Claustrophobia; Suicidal Medical History: Negative for: Anorexia/bulimia; Confinement Anxiety Hematologic/Lymphatic Medical History: Negative for: Anemia; Hemophilia; Human Immunodeficiency Virus; Lymphedema; Sickle Cell Disease Immunological Medical History: Negative for: Lupus Erythematosus; Raynauds Oncologic Medical History: Negative for: Received Chemotherapy; Received Radiation Immunizations Pneumococcal Vaccine: Received Pneumococcal Vaccination: No Implantable Devices None Family and Social History Cancer: Yes - Mother; Diabetes: Yes - Mother,Siblings; Heart Disease: No; Hereditary Spherocytosis: No; Hypertension: No; Kidney Disease: No; Lung Disease: No; Seizures: No; Stroke: Yes - Siblings; Thyroid Problems: Yes - Mother; Tuberculosis: No; Never smoker; Marital Status - Widowed; Alcohol Use: Never; Drug Use: No History; Caffeine Use: Moderate; Financial Concerns: No; Food, Clothing or Shelter Needs: No; Support System Lacking: No; Transportation Concerns: No Electronic Signature(s) Signed: 02/08/2019 5:57:33 PM By: Linton Ham MD Signed: 03/17/2019 12:05:18 PM By: Carlene Coria RN Entered By: Carlene Coria  on 02/08/2019 15:36:16 -------------------------------------------------------------------------------- Hope Details Patient Name: Date of Service: Brittany [name], Brittany [name] 02/08/2019 Medical Record [id number] Patient Account Number: [id number] Date of Birth/Sex: Treating RN: [birth year] (73 y.o. Female) Levan Hurst Primary Care Provider: Roma Schanz Other Clinician: Referring Provider: Treating Provider/Extender:Robson, Lennie Odor CHASE, Tora Kindred in Treatment: 0 Diagnosis Coding ICD-10 Codes Code Description L89.152 Pressure ulcer of sacral region, stage 2 Facility Procedures CPT4 Code: [id number] Description: [place] VISIT-LEV 3 EST PT Modifier: 25 Quantity: 1 CPT4 Code: [id number] Description: [id number] - DEB SUBQ TISSUE 20 SQ CM/< ICD-10 Diagnosis Description L89.152 Pressure ulcer of sacral region, stage 2 Modifier: Quantity: 1 Physician Procedures CPT4 Code: [id number] Description: [id number] - WC PHYS LEVEL 3 - EST PT ICD-10 Diagnosis Description L89.152 Pressure ulcer of sacral region, stage 2 Modifier: Quantity: 1 CPT4 Code: [id number] Description: [id number] - WC PHYS SUBQ TISS 20 SQ CM ICD-10 Diagnosis Description L89.152 Pressure ulcer of sacral region, stage 2 Modifier: Quantity: 1 Electronic Signature(s) Signed: 02/08/2019 5:57:33 PM By: Linton Ham MD Signed: 02/08/2019 6:00:40 PM By: Levan Hurst RN, BSN Entered By: Levan Hurst on 02/08/2019 17:32:57

## 2019-03-17 NOTE — Progress Notes (Signed)
Brittany [name], Brittany [name] ([id number]) Visit Report for 02/08/2019 Allergy List Details Patient Name: Date of Service: Brittany [name], Brittany [name] 02/08/2019 2:45 PM Medical Record [id number] Patient Account Number: [id number] Date of Birth/Sex: Treating RN: 1945/09/23 (73 y.o. Female) Carlene Coria Primary Care Rinda Rollyson: Carollee Herter, Kendrick Fries Other Clinician: Referring Dysen Edmondson: Treating Vinicio Lynk/Extender:Robson, Lennie Odor CHASE, YVONNE Weeks in Treatment: 0 Allergies Active Allergies Sulfa (Sulfonamide Antibiotics) Allergy Notes Electronic Signature(s) Signed: 03/17/2019 12:05:18 PM By: Carlene Coria RN Entered By: Carlene Coria on 02/08/2019 15:31:27 -------------------------------------------------------------------------------- Arrival Information Details Patient Name: Date of Service: Brittany [name] 02/08/2019 2:45 PM Medical Record [id number] Patient Account Number: [id number] Date of Birth/Sex: Treating RN: 1946-02-22 (73 y.o. Female) Carlene Coria Primary Care Caedan Sumler: Carollee Herter, Kendrick Fries Other Clinician: Referring Nikiya Starn: Treating Venesa Semidey/Extender:Robson, Tennis Ship, Tora Kindred in Treatment: 0 Visit Information Patient Arrived: Wheel Chair Arrival Time: 15:20 Accompanied By: friend Transfer Assistance: None Patient Identification Verified: Yes Secondary Verification Process Completed: Yes Patient Requires Transmission-Based No Precautions: Patient Has Alerts: No Electronic Signature(s) Signed: 03/17/2019 12:05:18 PM By: Carlene Coria RN Entered By: Carlene Coria on 02/08/2019 15:28:01 -------------------------------------------------------------------------------- Clinic Level of Care Assessment Details Patient Name: Date of Service: Brittany [name], Brittany [name] 02/08/2019 2:45 PM Medical Record [id number] Patient Account Number: [id number] Date of Birth/Sex: Treating RN: Nov 09, 1945 (73 y.o. Female) Levan Hurst Primary Care Donicia Druck: Roma Schanz Other Clinician: Referring Sanyia Dini: Treating Wileen Duncanson/Extender:Robson, Lennie Odor CHASE, Tora Kindred in Treatment: 0 Clinic Level of Care Assessment Items TOOL 1 Quantity Score X - Use when EandM and Procedure is performed on INITIAL visit 1 0 ASSESSMENTS - Nursing Assessment / Reassessment X - General Physical Exam (combine w/ comprehensive assessment (listed just below) 1 20 when performed on new pt. evals) X - Comprehensive Assessment (HX, ROS, Risk Assessments, Wounds Hx, etc.) 1 25 ASSESSMENTS - Wound and Skin Assessment / Reassessment []  - Dermatologic / Skin Assessment (not related to wound area) 0 ASSESSMENTS - Ostomy and/or Continence Assessment and Care []  - Incontinence Assessment and Management 0 []  - Ostomy Care Assessment and Management (repouching, etc.) 0 PROCESS - Coordination of Care X - Simple Patient / Family Education for ongoing care 1 15 []  - Complex (extensive) Patient / Family Education for ongoing care 0 X - Staff obtains Programmer, systems, Records, Test Results / Process Orders 1 10 []  - Staff telephones HHA, Nursing Homes / Clarify orders / etc 0 []  - Routine Transfer to another Facility (non-emergent condition) 0 []  - Routine Hospital Admission (non-emergent condition) 0 X - New Admissions / Biomedical engineer / Ordering NPWT, Apligraf, etc. 1 15 []  - Emergency Hospital Admission (emergent condition) 0 PROCESS - Special Needs []  - Pediatric / Minor Patient Management 0 []  - Isolation Patient Management 0 []  - Hearing / Language / Visual special needs 0 []  - Assessment of Community assistance (transportation, D/C planning, etc.) 0 []  - Additional assistance / Altered mentation 0 []  - Support Surface(s) Assessment (bed, cushion, seat, etc.) 0 INTERVENTIONS - Miscellaneous []  - External ear exam 0 []  - Patient Transfer (multiple staff / Civil Service fast streamer / Similar devices) 0 []  - Simple Staple / Suture removal (25 or less) 0 []  - Complex Staple /  Suture removal (26 or more) 0 []  - Hypo/Hyperglycemic Management (do not check if billed separately) 0 []  - Ankle / Brachial Index (ABI) - do not check if billed separately 0 Has the patient been seen at the hospital within the last three years: Yes Total Score: 85 Level Of Care: New/Established - Level 3  Electronic Signature(s) Signed: 02/08/2019 6:00:40 PM By: Levan Hurst RN, BSN Entered By: Levan Hurst on 02/08/2019 17:32:48 -------------------------------------------------------------------------------- Encounter Discharge Information Details Patient Name: Date of Service: Brittany [name], Brittany [name] 02/08/2019 2:45 PM Medical Record [id number] Patient Account Number: [id number] Date of Birth/Sex: Treating RN: 02/07/1946 (73 y.o. Female) Deon Pilling Primary Care Timmie Dugue: Carollee Herter, Kendrick Fries Other Clinician: Referring Jurgen Groeneveld: Treating Tyheim Vanalstyne/Extender:Robson, Lennie Odor CHASE, Tora Kindred in Treatment: 0 Encounter Discharge Information Items Post Procedure Vitals Discharge Condition: Stable Temperature (F): 98.2 Ambulatory Status: Wheelchair Pulse (bpm): 70 Discharge Destination: Home Respiratory Rate (breaths/min): 18 Transportation: Private Auto Blood Pressure (mmHg): 135/52 Accompanied By: self Schedule Follow-up Appointment: Yes Clinical Summary of Care: Electronic Signature(s) Signed: 02/08/2019 5:49:34 PM By: Deon Pilling Entered By: Deon Pilling on 02/08/2019 16:53:50 -------------------------------------------------------------------------------- Multi Wound Chart Details Patient Name: Date of Service: Brittany [name] 02/08/2019 2:45 PM Medical Record [id number] Patient Account Number: [id number] Date of Birth/Sex: Treating RN: 06/01/1945 (73 y.o. Female) Levan Hurst Primary Care Cathlin Buchan: Carollee Herter, Kendrick Fries Other Clinician: Referring Pernella Ackerley: Treating Oveda Dadamo/Extender:Robson, Lennie Odor CHASE, Tora Kindred in Treatment: 0 Vital  Signs Height(in): 70 Pulse(bpm): 70 Weight(lbs): 162 Blood Pressure(mmHg): 135/52 Body Mass Index(BMI): 23 Temperature(F): 98.2 Respiratory 18 Rate(breaths/min): Photos: [1:No Photos] [N/A:N/A] Wound Location: [1:Sacrum] [N/A:N/A] Wounding Event: [1:Gradually Appeared] [N/A:N/A] Primary Etiology: [1:Pressure Ulcer] [N/A:N/A] Comorbid History: [1:Hypertension, Type II Diabetes] [N/A:N/A] Date Acquired: [1:10/08/2018] [N/A:N/A] Weeks of Treatment: [1:0] [N/A:N/A] Wound Status: [1:Open] [N/A:N/A] Measurements L x W x D 1.1x0.3x0.2 [N/A:N/A] (cm) Area (cm) : [1:0.259] [N/A:N/A] Volume (cm) : [1:0.052] [N/A:N/A] % Reduction in Area: [1:0.00%] [N/A:N/A] % Reduction in Volume: 0.00% [N/A:N/A] Classification: [1:Unstageable/Unclassified] [N/A:N/A] Exudate Amount: [1:Medium] [N/A:N/A] Exudate Type: [1:Serosanguineous] [N/A:N/A] Exudate Color: [1:red, brown] [N/A:N/A] Wound Margin: [1:Flat and Intact] [N/A:N/A] Granulation Amount: [1:None Present (0%)] [N/A:N/A] Necrotic Amount: [1:Large (67-100%)] [N/A:N/A] Exposed Structures: [1:Fat Layer (Subcutaneous Tissue) Exposed: Yes Fascia: No Tendon: No Muscle: No Joint: No Bone: No] [N/A:N/A] Epithelialization: [1:None] [N/A:N/A] Debridement: [1:Debridement - Excisional] [N/A:N/A] Pre-procedure [1:16:08] [N/A:N/A] Verification/Time Out Taken: Tissue Debrided: [1:Subcutaneous, Slough] [N/A:N/A] Level: [1:Skin/Subcutaneous Tissue] [N/A:N/A] Debridement Area (sq cm):0.33 [N/A:N/A] Instrument: [1:Curette] [N/A:N/A] Bleeding: [1:Minimum] [N/A:N/A] Hemostasis Achieved: [1:Pressure] [N/A:N/A] Procedural Pain: [1:0] [N/A:N/A] Post Procedural Pain: [1:0] [N/A:N/A] Debridement Treatment Procedure was tolerated [N/A:N/A] Response: [1:well] Post Debridement [1:1.1x0.3x0.2] [N/A:N/A] Measurements L x W x D (cm) Post Debridement [1:0.052] [N/A:N/A] Volume: (cm) Post Debridement Stage: Unstageable/Unclassified [N/A:N/A N/A] Treatment  Notes Electronic Signature(s) Signed: 02/08/2019 5:57:33 PM By: Linton Ham MD Signed: 02/08/2019 6:00:40 PM By: Levan Hurst RN, BSN Entered By: Linton Ham on 02/08/2019 16:21:24 -------------------------------------------------------------------------------- Multi-Disciplinary Care Plan Details Patient Name: Date of Service: Brittany [name], Brittany [name] 02/08/2019 2:45 PM Medical Record [id number] Patient Account Number: [id number] Date of Birth/Sex: Treating RN: [birth year] (73 y.o. Female) Levan Hurst Primary Care Appollonia Klee: Carollee Herter, Kendrick Fries Other Clinician: Referring Marionna Gonia: Treating Makina Skow/Extender:Robson, Lennie Odor CHASE, Tora Kindred in Treatment: 0 Active Inactive Pressure Nursing Diagnoses: Knowledge deficit related to causes and risk factors for pressure ulcer development Knowledge deficit related to management of pressures ulcers Potential for impaired tissue integrity related to pressure, friction, moisture, and shear Goals: Patient/caregiver will verbalize risk factors for pressure ulcer development Date Initiated: 02/08/2019 Target Resolution Date: 03/12/2019 Goal Status: Active Patient/caregiver will verbalize understanding of pressure ulcer management Date Initiated: 02/08/2019 Target Resolution Date: 03/12/2019 Goal Status: Active Interventions: Assess: immobility, friction, shearing, incontinence upon admission and as needed Assess offloading mechanisms upon admission and as needed Assess potential for pressure ulcer upon admission and as needed Provide education on pressure ulcers Notes: Wound/Skin  Impairment Nursing Diagnoses: Impaired tissue integrity Knowledge deficit related to ulceration/compromised skin integrity Goals: Patient/caregiver will verbalize understanding of skin care regimen Date Initiated: 02/08/2019 Target Resolution Date: 03/12/2019 Goal Status: Active Ulcer/skin breakdown will have a volume reduction of 30% by week  4 Date Initiated: 02/08/2019 Target Resolution Date: 03/12/2019 Goal Status: Active Interventions: Assess patient/caregiver ability to obtain necessary supplies Assess patient/caregiver ability to perform ulcer/skin care regimen upon admission and as needed Assess ulceration(s) every visit Provide education on ulcer and skin care Notes: Electronic Signature(s) Signed: 02/08/2019 6:00:40 PM By: Levan Hurst RN, BSN Entered By: Levan Hurst on 02/08/2019 17:32:13 -------------------------------------------------------------------------------- Pain Assessment Details Patient Name: Date of Service: Brittany [name] 02/08/2019 2:45 PM Medical Record [id number] Patient Account Number: [id number] Date of Birth/Sex: Treating RN: October 10, 1945 (73 y.o. Female) Carlene Coria Primary Care Andreus Cure: Carollee Herter, Kendrick Fries Other Clinician: Referring Jawana Reagor: Treating Johnny Gorter/Extender:Robson, Lennie Odor CHASE, Tora Kindred in Treatment: 0 Active Problems Location of Pain Severity and Description of Pain Patient Has Paino No Site Locations Pain Management and Medication Current Pain Management: Electronic Signature(s) Signed: 03/17/2019 12:05:18 PM By: Carlene Coria RN Entered By: Carlene Coria on 02/08/2019 15:41:22 -------------------------------------------------------------------------------- Patient/Caregiver Education Details Patient Name: Brittany [name] 11/2/2020andnbsp2:45 Date of Service: PM Medical Record [id number] Number: Patient Account Number: [id number] Treating RN: Date of Birth/Gender: 1945/10/04 (72 y.o. Levan Hurst Female) Other Clinician: Primary Care Treating Byron, Gaetano Net Physician: Physician/Extender: Referring Physician: Ike Bene in Treatment: 0 Education Assessment Education Provided To: Patient Education Topics Provided Pressure: Methods: Explain/Verbal Responses: State content correctly Wound/Skin  Impairment: Methods: Explain/Verbal Responses: State content correctly Electronic Signature(s) Signed: 02/08/2019 6:00:40 PM By: Levan Hurst RN, BSN Entered By: Levan Hurst on 02/08/2019 17:32:27 -------------------------------------------------------------------------------- Wound Assessment Details Patient Name: Date of Service: Brittany [name] 02/08/2019 2:45 PM Medical Record [id number] Patient Account Number: [id number] Date of Birth/Sex: Treating RN: 13-Nov-1945 (73 y.o. Female) Carlene Coria Primary Care Iziah Cates: Carollee Herter, Kendrick Fries Other Clinician: Referring Amal Saiki: Treating Jakyren Fluegge/Extender:Robson, Lennie Odor CHASE, YVONNE Weeks in Treatment: 0 Wound Status Wound Number: 1 Primary Etiology: Pressure Ulcer Wound Location: Sacrum Wound Status: Open Wounding Event: Gradually Appeared Comorbid History: Hypertension, Type II Diabetes Date Acquired: 10/08/2018 Weeks Of Treatment: 0 Clustered Wound: No Photos Wound Measurements Length: (cm) 1.1 % Reduction in A Width: (cm) 0.3 % Reduction in V Depth: (cm) 0.2 Epithelializatio Area: (cm) 0.259 Tunneling: Volume: (cm) 0.052 Undermining: Wound Description Classification: Unstageable/Unclassified Foul Odor After Wound Margin: Flat and Intact Slough/Fibrino Exudate Amount: Medium Exudate Type: Serosanguineous Exudate Color: red, brown Wound Bed Granulation Amount: None Present (0%) Necrotic Amount: Large (67-100%) Fascia Exposed: Necrotic Quality: Adherent Slough Fat Layer (Subcu Tendon Exposed: Muscle Exposed: Joint Exposed: Bone Exposed: Treatment Notes Wound #1 (Sacrum) 1. Cleanse With Wound Cleanser 3. Primary Dressing Applied Calcium Alginate Ag 4. Secondary Dressing Foam Border Dressing 5. Secured With Self Adhesive Bandage Cleansing: No Yes Exposed Structure No taneous Tissue) Exposed: Yes No No No No rea: 0% olume: 0% n: None No No Notes educated patient how to apply  dressings, how often to change, when to return to wound center, and how supplies will arrive to her home. Patient in agreement. Electronic Signature(s) Signed: 02/15/2019 4:06:49 PM By: Mikeal Hawthorne EMT/HBOT Signed: 03/17/2019 12:05:18 PM By: Carlene Coria RN Previous Signature: 02/08/2019 6:00:40 PM Version By: Levan Hurst RN, BSN Entered By: Mikeal Hawthorne on 02/15/2019 10:43:23 -------------------------------------------------------------------------------- Vitals Details Patient Name: Date of Service: Brittany [name], Brittany [name] 02/08/2019 2:45 PM Medical Record [id number] Patient Account Number:  [id number] Date of Birth/Sex: Treating RN: July 02, 1945 (73 y.o. Female) Carlene Coria Primary Care Nashea Chumney: Carollee Herter, Kendrick Fries Other Clinician: Referring Timya Trimmer: Treating Klaire Court/Extender:Robson, Lennie Odor CHASE, Tora Kindred in Treatment: 0 Vital Signs Time Taken: 15:28 Temperature (F): 98.2 Height (in): 70 Pulse (bpm): 70 Source: Stated Respiratory Rate (breaths/min): 18 Weight (lbs): 162 Blood Pressure (mmHg): 135/52 Source: Stated Reference Range: 80 - 120 mg / dl Body Mass Index (BMI): 23.2 Electronic Signature(s) Signed: 03/17/2019 12:05:18 PM By: Carlene Coria RN Entered By: Carlene Coria on 02/08/2019 15:30:37

## 2019-03-17 NOTE — Progress Notes (Signed)
Brittany, [name] ([id number]) Visit Report for 02/08/2019 Abuse/Suicide Risk Screen Details Patient Name: Date of Service: Brittany [name], Brittany [name] 02/08/2019 2:45 PM Medical Record [id number] Patient Account Number: [id number] Date of Birth/Sex: Treating RN: 1945/06/25 (73 y.o. Female) Carlene Coria Primary Care Wilbon Obenchain: Carollee Herter, Kendrick Fries Other Clinician: Referring Bergen Melle: Treating Jann Milkovich/Extender:Robson, Lennie Odor CHASE, Tora Kindred in Treatment: 0 Abuse/Suicide Risk Screen Items Answer ABUSE RISK SCREEN: Has anyone close to you tried to hurt or harm you recentlyo No Do you feel uncomfortable with anyone in your familyo No Has anyone forced you do things that you didnt want to doo No Electronic Signature(s) Signed: 03/17/2019 12:05:18 PM By: Carlene Coria RN Entered By: Carlene Coria on 02/08/2019 15:36:25 -------------------------------------------------------------------------------- Activities of Daily Living Details Patient Name: Date of Service: TIVA, POPKO 02/08/2019 2:45 PM Medical Record [id number] Patient Account Number: [id number] Date of Birth/Sex: Treating RN: Sep 28, 1945 (73 y.o. Female) Carlene Coria Primary Care Ocean Schildt: Carollee Herter, Kendrick Fries Other Clinician: Referring Akaisha Truman: Treating Makayla Lanter/Extender:Robson, Lennie Odor CHASE, Tora Kindred in Treatment: 0 Activities of Daily Living Items Answer Activities of Daily Living (Please select one for each item) Drive Automobile Not Able Take Medications Completely Able Use Telephone Completely Able Care for Appearance Completely Able Use Toilet Completely Able Bath / Shower Completely Able Dress Self Completely Able Feed Self Completely Able Walk Completely Able Get In / Out Bed Completely Able Housework Completely Able Prepare Meals Completely Able Handle Money Completely Able Shop for Self Completely Able Electronic Signature(s) Signed: 03/17/2019 12:05:18 PM By: Carlene Coria  RN Entered By: Carlene Coria on 02/08/2019 15:37:04 -------------------------------------------------------------------------------- Education Screening Details Patient Name: Date of Service: Brittany [name] 02/08/2019 2:45 PM Medical Record [id number] Patient Account Number: [id number] Date of Birth/Sex: Treating RN: 1945/07/21 (73 y.o. Female) Carlene Coria Primary Care Xzavion Doswell: Carollee Herter, Kendrick Fries Other Clinician: Referring Britiany Silbernagel: Treating Edwards Mckelvie/Extender:Robson, [place] Folk in Treatment: 0 Primary Learner Assessed: Patient Learning Preferences/Education Level/Primary Language Learning Preference: Explanation Highest Education Level: College or Above Preferred Language: English Cognitive Barrier Language Barrier: No Translator Needed: No Memory Deficit: No Emotional Barrier: No Cultural/Religious Beliefs Affecting Medical Care: No Physical Barrier Impaired Vision: No Impaired Hearing: No Decreased Hand dexterity: No Knowledge/Comprehension Knowledge Level: High Comprehension Level: High Ability to understand written High instructions: Ability to understand verbal High instructions: Motivation Anxiety Level: Calm Cooperation: Cooperative Education Importance: Acknowledges Need Interest in Health Problems: Asks Questions Perception: Coherent Willingness to Engage in Self- High Management Activities: Readiness to Engage in Self- High Management Activities: Electronic Signature(s) Signed: 03/17/2019 12:05:18 PM By: Carlene Coria RN Entered By: Carlene Coria on 02/08/2019 15:37:44 -------------------------------------------------------------------------------- Fall Risk Assessment Details Patient Name: Date of Service: Brittany [name] 02/08/2019 2:45 PM Medical Record [id number] Patient Account Number: [id number] Date of Birth/Sex: Treating RN: December 24, 1945 (73 y.o. Female) Carlene Coria Primary Care Kaleena Corrow: Carollee Herter,  Kendrick Fries Other Clinician: Referring Dessa Ledee: Treating Thayer Inabinet/Extender:Robson, Tennis Ship, Tora Kindred in Treatment: 0 Fall Risk Assessment Items Have you had 2 or more falls in the last 12 monthso 0 No Have you had any fall that resulted in injury in the last 12 monthso 0 No FALLS RISK SCREEN History of falling - immediate or within 3 months 0 No Secondary diagnosis (Do you have 2 or more medical diagnoseso) 0 No Ambulatory aid None/bed rest/wheelchair/nurse 0 No Crutches/cane/walker 0 No Furniture 0 No Intravenous therapy Access/Saline/Heparin Lock 0 No Weak (short steps with or without shuffle, stooped but able to lift head 0 No while walking, may seek support from  furniture) Impaired (short steps with shuffle, may have difficulty arising from chair, 0 No head down, impaired balance) Mental Status Oriented to own ability 0 No Overestimates or forgets limitations 0 No Risk Level: Low Risk Score: 0 Electronic Signature(s) Signed: 03/17/2019 12:05:18 PM By: Carlene Coria RN Entered By: Carlene Coria on 02/08/2019 15:37:54 -------------------------------------------------------------------------------- Nutrition Risk Screening Details Patient Name: Date of Service: Brittany, [name] 02/08/2019 2:45 PM Medical Record [id number] Patient Account Number: [id number] Date of Birth/Sex: Treating RN: 07/02/1945 (73 y.o. Female) Epps, Morey Hummingbird Primary Care Crisol Muecke: Carollee Herter, Kendrick Fries Other Clinician: Referring Izac Faulkenberry: Treating Shateria Paternostro/Extender:Robson, Lennie Odor CHASE, YVONNE Weeks in Treatment: 0 Height (in): 70 Weight (lbs): 162 Body Mass Index (BMI): 23.2 Nutrition Risk Screening Items Score Screening NUTRITION RISK SCREEN: I have an illness or condition that made me change the kind and/or 0 No amount of food I eat I eat fewer than two meals per day 0 No I eat few fruits and vegetables, or milk products 0 No I have three or more drinks of beer, liquor or  wine almost every day 0 No I have tooth or mouth problems that make it hard for me to eat 0 No I don't always have enough money to buy the food I need 0 No I eat alone most of the time 1 Yes I take three or more different prescribed or over-the-counter drugs a day 1 Yes 0 No Without wanting to, I have lost or gained 10 pounds in the last six months I am not always physically able to shop, cook and/or feed myself 2 Yes Nutrition Protocols Good Risk Protocol Provide education on Moderate Risk Protocol 0 nutrition High Risk Proctocol Risk Level: Moderate Risk Score: 4 Electronic Signature(s) Signed: 03/17/2019 12:05:18 PM By: Carlene Coria RN Entered By: Carlene Coria on 02/08/2019 15:38:11

## 2019-03-18 ENCOUNTER — Other Ambulatory Visit: Payer: Self-pay

## 2019-03-18 ENCOUNTER — Ambulatory Visit (INDEPENDENT_AMBULATORY_CARE_PROVIDER_SITE_OTHER): Payer: Medicare HMO | Admitting: Family Medicine

## 2019-03-18 ENCOUNTER — Encounter: Payer: Self-pay | Admitting: Family Medicine

## 2019-03-18 VITALS — BP 140/60 | Temp 97.1°F | Ht 69.0 in | Wt 173.0 lb

## 2019-03-18 DIAGNOSIS — I10 Essential (primary) hypertension: Secondary | ICD-10-CM | POA: Diagnosis not present

## 2019-03-18 DIAGNOSIS — E1169 Type 2 diabetes mellitus with other specified complication: Secondary | ICD-10-CM | POA: Diagnosis not present

## 2019-03-18 DIAGNOSIS — E1110 Type 2 diabetes mellitus with ketoacidosis without coma: Secondary | ICD-10-CM | POA: Diagnosis not present

## 2019-03-18 DIAGNOSIS — E785 Hyperlipidemia, unspecified: Secondary | ICD-10-CM | POA: Diagnosis not present

## 2019-03-18 MED ORDER — JANUMET 50-1000 MG PO TABS: ORAL_TABLET | ORAL | 1 refills | Status: DC

## 2019-03-18 NOTE — Progress Notes (Signed)
Virtual Visit via Telephone Note  I connected with Brittany [name] on 03/18/19 at  4:00 PM EST by telephone and verified that I am speaking with the correct person using two identifiers.  Location: Patient: home  Provider: office    I discussed the limitations, risks, security and privacy concerns of performing an evaluation and management service by telephone and the availability of in person appointments. I also discussed with the patient that there may be a patient responsible charge related to this service. The patient expressed understanding and agreed to proceed.   History of Present Illness: Pt is home and needs f/u for dm, chol and bp.   No new complaints.  Pt is recuperating at home from having her toes amputated.[name]      HPI HYPERTENSION   Blood pressure range-good per pt   Chest pain- no      Dyspnea- no Lightheadedness- no   Edema- no  Other side effects - no   Medication compliance: good Low salt diet- yes    DIABETES    Blood Sugar ranges-120-130  Polyuria- no New Visual problems- no  Hypoglycemic symptoms- no  Other side effects-no Medication compliance - good Last eye exam- due     HYPERLIPIDEMIA  Medication compliance- good  RUQ pain- no  Muscle aches- no Other side effects-no   Past Medical History:  Diagnosis Date  . Anemia    as a younger woman  . Arthritis    "all over my body" (10/01/2017)  . Chronic pain    In pain clinic   . Constipation   . Depression   . Diabetic peripheral neuropathy ([place])   . GERD (gastroesophageal reflux disease)   . High cholesterol   . History of hiatal hernia   . Hypertension   . Type II diabetes mellitus ([place]) dx'd ~ 2008   Current Outpatient Medications on File Prior to Visit  Medication Sig Dispense Refill  . amLODipine (NORVASC) 5 MG tablet TAKE 1 TABLET DAILY 30 tablet 0  . aspirin EC 81 MG tablet Take 1 tablet (81 mg total) by mouth 2 (two) times daily. (Patient taking differently: Take 81 mg by mouth at  bedtime. ) 84 tablet 0  . atorvastatin (LIPITOR) 10 MG tablet TAKE 1 TABLET DAILY. OFFICEVISIT NEEDED 30 tablet 0  . bisoprolol-hydrochlorothiazide (ZIAC) 10-6.25 MG tablet TAKE 1 TABLET DAILY 30 tablet 0  . Blood Glucose Monitoring Suppl (ONE TOUCH ULTRA MINI) w/Device KIT Use as directed once a day.  Dx Code:  E11.42 1 each 0  . calcium carbonate (TUMS - DOSED IN MG ELEMENTAL CALCIUM) 500 MG chewable tablet Chew 1 tablet by mouth as needed for indigestion or heartburn.    . docusate sodium (COLACE) 100 MG capsule Take 100 mg by mouth 2 (two) times daily.    [name] doxycycline (VIBRA-TABS) 100 MG tablet Take 1 tablet (100 mg total) by mouth 2 (two) times daily. 20 tablet 0  . escitalopram (LEXAPRO) 10 MG tablet TAKE 1 TABLET DAILY 30 tablet 0  . estradiol (ESTRACE) 1 MG tablet TAKE 1 TABLET DAILY 90 tablet 1  . ferrous sulfate 325 (65 FE) MG tablet Take 325 mg by mouth daily with breakfast.    . folic acid (FOLVITE) 270 MCG tablet Take 800 mcg by mouth daily.     . furosemide (LASIX) 20 MG tablet Take 1 tablet (20 mg total) by mouth daily. 90 tablet 3  . gabapentin (NEURONTIN) 800 MG tablet Take 1 tablet (800 mg total)  by mouth 4 (four) times daily. 360 tablet 3  . glucose blood (ONE TOUCH ULTRA TEST) test strip Check Blood sugar once daily 100 each 12  . KLOR-CON M20 20 MEQ tablet TAKE 1 TABLET DAILY. PLEASEMAKE AN APPOINTMENT WITH   YOUR DOCTOR 30 tablet 0  . methocarbamol (ROBAXIN) 750 MG tablet Take 1 tablet (750 mg total) by mouth 2 (two) times daily as needed for muscle spasms. 60 tablet 0  . morphine (MS CONTIN) 60 MG 12 hr tablet Take 60 mg by mouth every 12 (twelve) hours.   0  . Multiple Vitamins-Minerals (MULTIVITAMIN GUMMIES WOMENS PO) Take 1 tablet by mouth daily.     . Omega-3 Fatty Acids (FISH OIL) 1000 MG CAPS Take 1,000 mg by mouth daily.    [name] omeprazole (PRILOSEC) 40 MG capsule TAKE 1 CAPSULE DAILY.      PLEASE MAKE AN APPOINTMENT WITH YOUR DOCTOR 30 capsule 0  . ONETOUCH DELICA  LANCETS 10G MISC Check blood sugar once daily 100 each 12  . promethazine (PHENERGAN) 25 MG tablet Take 1 tablet (25 mg total) by mouth every 6 (six) hours as needed for nausea. 30 tablet 1  . senna-docusate (SENOKOT S) 8.6-50 MG tablet Take 1-2 tablets by mouth at bedtime as needed. 30 tablet 1  . Specialty Vitamins Products (BIOTIN PLUS KERATIN) 10000-100 MCG-MG TABS Take 1 tablet by mouth daily.    [name] tiZANidine (ZANAFLEX) 4 MG tablet Take 4 mg by mouth 3 (three) times daily.      No current facility-administered medications on file prior to visit.   Allergies  Allergen Reactions  . Sulfonamide Derivatives Hives     Observations/Objective: Vitals:   03/18/19 1540  BP: 140/60  Temp: (!) 97.1 F (36.2 C)   Pt is in NAD  Assessment and Plan: 1. Uncontrolled type 2 diabetes mellitus with ketoacidosis without coma, without long-term current use of insulin (HCC) hgba1c acceptable, minimize simple carbs. Increase exercise as tolerated. Continue current meds   - sitaGLIPtin-metformin (JANUMET) 50-1000 MG tablet; TAKE 2 TABLETS AT BEDTIME. PLEASE MAKE AN APPOINTMENT WITH YOUR DOCTOR  Dispense: 180 tablet; Refill: 1 - Hemoglobin A1c; Future - Comprehensive metabolic panel; Future - Microalbumin / creatinine urine ratio; Future  2. Essential hypertension Well controlled, no changes to meds. Encouraged heart healthy diet such as the DASH diet and exercise as tolerated.   3. Hyperlipidemia associated with type 2 diabetes mellitus ([place]) Tolerating statin, encouraged heart healthy diet, avoid trans fats, minimize simple carbs and saturated fats. Increase exercise as tolerated - Lipid panel; Future - Comprehensive metabolic panel; Future  Follow Up Instructions:    I discussed the assessment and treatment plan with the patient. The patient was provided an opportunity to ask questions and all were answered. The patient agreed with the plan and demonstrated an understanding of the  instructions.   The patient was advised to call back or seek an in-person evaluation if the symptoms worsen or if the condition fails to improve as anticipated.  I provided 15 minutes of non-face-to-face time during this encounter.   Ann Held, DO

## 2019-03-18 NOTE — Patient Instructions (Signed)
Carbohydrate Counting for Diabetes Mellitus, Adult  Carbohydrate counting is a method of keeping track of how many carbohydrates you eat. Eating carbohydrates naturally increases the amount of sugar (glucose) in the blood. Counting how many carbohydrates you eat helps keep your blood glucose within normal limits, which helps you manage your diabetes (diabetes mellitus). It is important to know how many carbohydrates you can safely have in each meal. This is different for every person. A diet and nutrition specialist (registered dietitian) can help you make a meal plan and calculate how many carbohydrates you should have at each meal and snack. Carbohydrates are found in the following foods:  Grains, such as breads and cereals.  Dried beans and soy products.  Starchy vegetables, such as potatoes, peas, and corn.  Fruit and fruit juices.  Milk and yogurt.  Sweets and snack foods, such as cake, cookies, candy, chips, and soft drinks. How do I count carbohydrates? There are two ways to count carbohydrates in food. You can use either of the methods or a combination of both. Reading "Nutrition Facts" on packaged food The "Nutrition Facts" list is included on the labels of almost all packaged foods and beverages in the [place] It includes:  The serving size.  Information about nutrients in each serving, including the grams (g) of carbohydrate per serving. To use the "Nutrition Facts":  Decide how many servings you will have.  Multiply the number of servings by the number of carbohydrates per serving.  The resulting number is the total amount of carbohydrates that you will be having. Learning standard serving sizes of other foods When you eat carbohydrate foods that are not packaged or do not include "Nutrition Facts" on the label, you need to measure the servings in order to count the amount of carbohydrates:  Measure the foods that you will eat with a food scale or measuring cup, if needed.   Decide how many standard-size servings you will eat.  Multiply the number of servings by 15. Most carbohydrate-rich foods have about 15 g of carbohydrates per serving. ? For example, if you eat 8 oz (170 g) of strawberries, you will have eaten 2 servings and 30 g of carbohydrates (2 servings x 15 g = 30 g).  For foods that have more than one food mixed, such as soups and casseroles, you must count the carbohydrates in each food that is included. The following list contains standard serving sizes of common carbohydrate-rich foods. Each of these servings has about 15 g of carbohydrates:   hamburger bun or  English muffin.   oz (15 mL) syrup.   oz (14 g) jelly.  1 slice of bread.  1 six-inch tortilla.  3 oz (85 g) cooked rice or pasta.  4 oz (113 g) cooked dried beans.  4 oz (113 g) starchy vegetable, such as peas, corn, or potatoes.  4 oz (113 g) hot cereal.  4 oz (113 g) mashed potatoes or  of a large baked potato.  4 oz (113 g) canned or frozen fruit.  4 oz (120 mL) fruit juice.  4-6 crackers.  6 chicken nuggets.  6 oz (170 g) unsweetened dry cereal.  6 oz (170 g) plain fat-free yogurt or yogurt sweetened with artificial sweeteners.  8 oz (240 mL) milk.  8 oz (170 g) fresh fruit or one small piece of fruit.  24 oz (680 g) popped popcorn. Example of carbohydrate counting Sample meal  3 oz (85 g) chicken breast.  6 oz (170 g)   brown rice.  4 oz (113 g) corn.  8 oz (240 mL) milk.  8 oz (170 g) strawberries with sugar-free whipped topping. Carbohydrate calculation 1. Identify the foods that contain carbohydrates: ? Rice. ? Corn. ? Milk. ? Strawberries. 2. Calculate how many servings you have of each food: ? 2 servings rice. ? 1 serving corn. ? 1 serving milk. ? 1 serving strawberries. 3. Multiply each number of servings by 15 g: ? 2 servings rice x 15 g = 30 g. ? 1 serving corn x 15 g = 15 g. ? 1 serving milk x 15 g = 15 g. ? 1 serving  strawberries x 15 g = 15 g. 4. Add together all of the amounts to find the total grams of carbohydrates eaten: ? 30 g + 15 g + 15 g + 15 g = 75 g of carbohydrates total. Summary  Carbohydrate counting is a method of keeping track of how many carbohydrates you eat.  Eating carbohydrates naturally increases the amount of sugar (glucose) in the blood.  Counting how many carbohydrates you eat helps keep your blood glucose within normal limits, which helps you manage your diabetes.  A diet and nutrition specialist (registered dietitian) can help you make a meal plan and calculate how many carbohydrates you should have at each meal and snack. This information is not intended to replace advice given to you by your health care provider. Make sure you discuss any questions you have with your health care provider. Document Released: 03/25/2005 Document Revised: 10/17/2016 Document Reviewed: 09/06/2015 Elsevier Patient Education  2020 Elsevier Inc.  

## 2019-03-23 ENCOUNTER — Ambulatory Visit: Payer: Medicare HMO | Admitting: Physical Therapy

## 2019-04-05 DIAGNOSIS — S98311A Complete traumatic amputation of right midfoot, initial encounter: Secondary | ICD-10-CM | POA: Diagnosis not present

## 2019-04-13 DIAGNOSIS — G4700 Insomnia, unspecified: Secondary | ICD-10-CM | POA: Diagnosis not present

## 2019-04-13 DIAGNOSIS — E1142 Type 2 diabetes mellitus with diabetic polyneuropathy: Secondary | ICD-10-CM | POA: Diagnosis not present

## 2019-04-13 DIAGNOSIS — G894 Chronic pain syndrome: Secondary | ICD-10-CM | POA: Diagnosis not present

## 2019-04-13 DIAGNOSIS — Z79891 Long term (current) use of opiate analgesic: Secondary | ICD-10-CM | POA: Diagnosis not present

## 2019-04-26 ENCOUNTER — Other Ambulatory Visit: Payer: Self-pay | Admitting: Family Medicine

## 2019-04-26 DIAGNOSIS — I10 Essential (primary) hypertension: Secondary | ICD-10-CM

## 2019-05-07 ENCOUNTER — Other Ambulatory Visit: Payer: Self-pay | Admitting: Family Medicine

## 2019-05-07 DIAGNOSIS — K219 Gastro-esophageal reflux disease without esophagitis: Secondary | ICD-10-CM

## 2019-05-07 DIAGNOSIS — E785 Hyperlipidemia, unspecified: Secondary | ICD-10-CM

## 2019-05-07 DIAGNOSIS — I10 Essential (primary) hypertension: Secondary | ICD-10-CM

## 2019-05-07 DIAGNOSIS — E1169 Type 2 diabetes mellitus with other specified complication: Secondary | ICD-10-CM

## 2019-05-10 ENCOUNTER — Other Ambulatory Visit: Payer: Self-pay | Admitting: Family Medicine

## 2019-05-10 ENCOUNTER — Other Ambulatory Visit: Payer: Self-pay

## 2019-05-10 DIAGNOSIS — I10 Essential (primary) hypertension: Secondary | ICD-10-CM

## 2019-05-10 DIAGNOSIS — E1169 Type 2 diabetes mellitus with other specified complication: Secondary | ICD-10-CM

## 2019-05-10 DIAGNOSIS — K219 Gastro-esophageal reflux disease without esophagitis: Secondary | ICD-10-CM

## 2019-05-10 DIAGNOSIS — E785 Hyperlipidemia, unspecified: Secondary | ICD-10-CM

## 2019-05-10 MED ORDER — ATORVASTATIN CALCIUM 10 MG PO TABS: 10.0000 mg | ORAL_TABLET | Freq: Every day | ORAL | 0 refills | Status: DC

## 2019-05-10 MED ORDER — OMEPRAZOLE 40 MG PO CPDR: DELAYED_RELEASE_CAPSULE | ORAL | 0 refills | Status: DC

## 2019-05-10 NOTE — Telephone Encounter (Signed)
Caller Name: patient Phone: [phone]  Medication: atorvastatin, klorcon, omeprazole  Has the patient contacted their pharmacy? Yes - pt contacted [facility] and they sent this request - due to being out of medication pt has contact [facility] and they need an rx for 10 day supply - Pt is scheduled for lab appt 2/3  [facility] for 10 day supply of each  [facility] for 90 day supply of each

## 2019-05-12 ENCOUNTER — Other Ambulatory Visit: Payer: Medicare HMO

## 2019-05-17 ENCOUNTER — Other Ambulatory Visit: Payer: Medicare HMO

## 2019-05-20 ENCOUNTER — Other Ambulatory Visit: Payer: Self-pay

## 2019-05-20 ENCOUNTER — Telehealth: Payer: Self-pay | Admitting: Family Medicine

## 2019-05-20 ENCOUNTER — Other Ambulatory Visit (INDEPENDENT_AMBULATORY_CARE_PROVIDER_SITE_OTHER): Payer: Medicare HMO

## 2019-05-20 DIAGNOSIS — E1169 Type 2 diabetes mellitus with other specified complication: Secondary | ICD-10-CM

## 2019-05-20 DIAGNOSIS — E1110 Type 2 diabetes mellitus with ketoacidosis without coma: Secondary | ICD-10-CM

## 2019-05-20 DIAGNOSIS — E785 Hyperlipidemia, unspecified: Secondary | ICD-10-CM | POA: Diagnosis not present

## 2019-05-20 LAB — COMPREHENSIVE METABOLIC PANEL
ALT: 15 U/L (ref 0–35)
AST: 17 U/L (ref 0–37)
Albumin: 4 g/dL (ref 3.5–5.2)
Alkaline Phosphatase: 56 U/L (ref 39–117)
BUN: 15 mg/dL (ref 6–23)
CO2: 33 mEq/L — ABNORMAL HIGH (ref 19–32)
Calcium: 9.2 mg/dL (ref 8.4–10.5)
Chloride: 102 mEq/L (ref 96–112)
Creatinine, Ser: 0.77 mg/dL (ref 0.40–1.20)
GFR: 73.86 mL/min (ref >60.00)
Glucose, Bld: 96 mg/dL (ref 70–99)
Potassium: 4 mEq/L (ref 3.5–5.1)
Sodium: 140 mEq/L (ref 135–145)
Total Bilirubin: 0.4 mg/dL (ref 0.2–1.2)
Total Protein: 6.9 g/dL (ref 6.0–8.3)

## 2019-05-20 LAB — LIPID PANEL
Cholesterol: 167 mg/dL (ref 0–200)
HDL: 84.4 mg/dL (ref >39.00)
LDL Cholesterol: 65 mg/dL (ref 0–99)
NonHDL: 83.05
Total CHOL/HDL Ratio: 2
Triglycerides: 88 mg/dL (ref 0.0–149.0)
VLDL: 17.6 mg/dL (ref 0.0–40.0)

## 2019-05-20 LAB — MICROALBUMIN / CREATININE URINE RATIO
Creatinine,U: 89.4 mg/dL
Microalb Creat Ratio: 1 mg/g (ref 0.0–30.0)
Microalb, Ur: 0.9 mg/dL (ref 0.0–1.9)

## 2019-05-20 LAB — HEMOGLOBIN A1C: Hgb A1c MFr Bld: 6.6 % — ABNORMAL HIGH (ref 4.6–6.5)

## 2019-05-20 NOTE — Telephone Encounter (Signed)
Pt dropped off document for provider to see about pt prescription- pt states did not understand and wanted provider to have updated on her chart. 3 pages [facility]. Document put at front office tray under providers name.

## 2019-05-23 ENCOUNTER — Other Ambulatory Visit: Payer: Self-pay | Admitting: Family Medicine

## 2019-05-23 DIAGNOSIS — E1169 Type 2 diabetes mellitus with other specified complication: Secondary | ICD-10-CM

## 2019-05-23 DIAGNOSIS — E1165 Type 2 diabetes mellitus with hyperglycemia: Secondary | ICD-10-CM

## 2019-05-24 ENCOUNTER — Other Ambulatory Visit: Payer: Self-pay | Admitting: Family Medicine

## 2019-05-24 DIAGNOSIS — E1169 Type 2 diabetes mellitus with other specified complication: Secondary | ICD-10-CM

## 2019-05-24 DIAGNOSIS — E1165 Type 2 diabetes mellitus with hyperglycemia: Secondary | ICD-10-CM

## 2019-05-24 DIAGNOSIS — E785 Hyperlipidemia, unspecified: Secondary | ICD-10-CM

## 2019-05-26 NOTE — Telephone Encounter (Signed)
Rx Drug coverage and your rights notification

## 2019-06-02 ENCOUNTER — Ambulatory Visit: Payer: Medicare HMO | Admitting: Family

## 2019-06-09 ENCOUNTER — Telehealth: Payer: Self-pay | Admitting: Family Medicine

## 2019-06-09 NOTE — Chronic Care Management (AMB) (Signed)
  Chronic Care Management   Note  06/09/2019 Name: Brittany [name] MRN: [id number] DOB: [birth year]  Brittany [name] is a 74 y.o. year old female who is a primary care patient of Ann Held, DO. I reached out to [name] by phone today in response to a referral sent by [name]'s PCP, Carollee Herter, Alferd Apa, DO.   [name] was given information about Chronic Care Management services today including:  1. CCM service includes personalized support from designated clinical staff supervised by her physician, including individualized plan of care and coordination with other care providers 2. 24/7 contact phone numbers for assistance for urgent and routine care needs. 3. Service will only be billed when office clinical staff spend 20 minutes or more in a month to coordinate care. 4. Only one practitioner may furnish and bill the service in a calendar month. 5. The patient may stop CCM services at any time (effective at the end of the month) by phone call to the office staff. 6. The patient will be responsible for cost sharing (co-pay) of up to 20% of the service fee (after annual deductible is met).  Patient agreed to services and verbal consent obtained.   Follow up plan:   [name] [employer] Scheduler

## 2019-06-10 ENCOUNTER — Other Ambulatory Visit: Payer: Self-pay

## 2019-06-10 ENCOUNTER — Ambulatory Visit (INDEPENDENT_AMBULATORY_CARE_PROVIDER_SITE_OTHER): Payer: Medicare HMO

## 2019-06-10 ENCOUNTER — Ambulatory Visit (INDEPENDENT_AMBULATORY_CARE_PROVIDER_SITE_OTHER): Payer: Medicare HMO | Admitting: Orthopedic Surgery

## 2019-06-10 ENCOUNTER — Encounter: Payer: Self-pay | Admitting: Orthopedic Surgery

## 2019-06-10 VITALS — Ht 69.0 in | Wt 173.0 lb

## 2019-06-10 DIAGNOSIS — M79672 Pain in left foot: Secondary | ICD-10-CM | POA: Diagnosis not present

## 2019-06-10 DIAGNOSIS — G4700 Insomnia, unspecified: Secondary | ICD-10-CM | POA: Diagnosis not present

## 2019-06-10 DIAGNOSIS — L03116 Cellulitis of left lower limb: Secondary | ICD-10-CM

## 2019-06-10 DIAGNOSIS — E1142 Type 2 diabetes mellitus with diabetic polyneuropathy: Secondary | ICD-10-CM | POA: Diagnosis not present

## 2019-06-10 DIAGNOSIS — Z79891 Long term (current) use of opiate analgesic: Secondary | ICD-10-CM | POA: Diagnosis not present

## 2019-06-10 DIAGNOSIS — G894 Chronic pain syndrome: Secondary | ICD-10-CM | POA: Diagnosis not present

## 2019-06-10 MED ORDER — DOXYCYCLINE HYCLATE 100 MG PO TABS: 100.0000 mg | ORAL_TABLET | Freq: Two times a day (BID) | ORAL | 0 refills | Status: DC

## 2019-06-10 NOTE — Progress Notes (Signed)
Office Visit Note   Patient: Brittany [name]           Date of Birth: Oct 03, 1945           MRN: [id number] Visit Date: 06/10/2019              Requested by: [address], [place], [place] [address] [place],  [place] [address] PCP: Carollee Herter, Alferd Apa, DO  Chief Complaint  Patient presents with  . Right Foot - Follow-up    01/29/19 right transmet amputation       HPI: Patient is a 74 year old woman who presents with acute blistering cellulitis left foot great toe and second toe with cellulitis extending to the midfoot.  Patient states she started having blistering to her toe and she popped it with a pen.  Patient complains of bloody drainage.  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  Assessment & Plan: Visit Diagnoses: No diagnosis found.  Plan: No clinical signs of osteomyelitis at this time we will start on doxycycline for an oral antibiotic and Bactroban for topical antibiotic.  We will give her a postoperative shoe recommended the importance of minimizing weightbearing.  Follow-Up Instructions: No follow-ups on file.   Ortho Exam  Patient is alert, oriented, no adenopathy, well-dressed, normal affect, normal respiratory effort. Examination patient has a palpable dorsalis pedis and posterior tibial pulse Doppler was used she has a strong biphasic dorsalis pedis and posterior tibial pulse.  She has significant Achilles contracture on the left worse than the right with dorsiflexion 20 degrees short of neutral with her knee extended.  There is blistering and cellulitis of the great toe and second toe with cellulitis extending to the midfoot.  Most recent hemoglobin A1c is 6.6.  Imaging: No results found. No images are attached to the encounter.  Labs: Lab Results  Component Value Date   HGBA1C 6.6 (H) 05/20/2019   HGBA1C 6.3 (H) 11/12/2018   HGBA1C 6.9 (H) 04/17/2018   REPTSTATUS 08/15/2017 FINAL 08/10/2017   GRAMSTAIN NO WBC SEEN NO ORGANISMS SEEN  08/10/2017   CULT  08/10/2017    No growth aerobically or anaerobically. Performed at [facility], [place] [address] [place], [place] [address]    Doy Hutching  03/11/2016    Three or more organisms present,each greater than 10,000 CFU/mL.These organisms,commonly found on external and internal genitalia,are considered to be colonizers.No further testing performed.      Lab Results  Component Value Date   ALBUMIN 4.0 05/20/2019   ALBUMIN 3.6 11/12/2018   ALBUMIN 3.7 04/17/2018    No results found for: MG No results found for: VD25OH  No results found for: PREALBUMIN CBC EXTENDED Latest Ref Rng & Units 01/29/2019 11/18/2018 11/17/2018  WBC 4.0 - 10.5 K/uL 10.6(H) 14.0(H) 10.2  RBC 3.87 - 5.11 MIL/uL 4.55 3.34(L) 3.45(L)  HGB 12.0 - 15.0 g/dL 12.2 9.6(L) 9.9(L)  HCT 36.0 - 46.0 % 39.6 28.8(L) 30.0(L)  PLT 150 - 400 K/uL 244 164 165  NEUTROABS 1.7 - 7.7 K/uL - - -  LYMPHSABS 0.7 - 4.0 K/uL - - -     Body mass index is 25.55 kg/m.  Orders:  No orders of the defined types were placed in this encounter.  No orders of the defined types were placed in this encounter.    Procedures: No procedures performed  Clinical Data: No additional findings.  ROS:  All other systems negative, except as noted  in the HPI. Review of Systems  Objective: Vital Signs: Ht 5\' 9"  (1.753 m)   Wt 173 lb (78.5 kg)   BMI 25.55 kg/m   Specialty Comments:  No specialty comments available.  PMFS History: Patient Active Problem List   Diagnosis Date Noted  . 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 Vaginal prolapse 09/30/2018  . Primary osteoarthritis of left hip 06/10/2018  . Osteomyelitis of second toe of right foot ([place]) 04/24/2018  . Osteomyelitis of right foot ([place]) 04/21/2018  . 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 Callus 01/13/2018  . Generalized anxiety disorder 10/16/2017  . Menopause  10/16/2017  . DM (diabetes mellitus) type II uncontrolled, periph vascular disorder ([place]) 10/16/2017  . Hyperlipidemia associated with type 2 diabetes mellitus ([place]) 10/16/2017  . 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 Osteomyelitis of great toe of right foot ([place]) 09/25/2017  . Pain in right foot 09/16/2017  . Cellulitis 08/08/2017  . Hypoglycemia without diagnosis of diabetes mellitus 08/08/2017  . Peripheral neuropathy 08/08/2017  . Abscess 08/08/2017  . Hyperlipidemia LDL goal <70 09/22/2016  . Tooth pain 09/03/2012  . Edema 09/03/2012  . Supraclavicular fossa fullness 06/18/2012  . Hair loss 09/18/2010  . Fatigue 09/18/2010  . DYSPEPSIA&OTHER [facility] DISORDERS FUNCTION STOMACH 05/08/2010  . DIARRHEA 05/08/2010  . DIZZINESS 04/19/2010  . OTHER ACUTE SINUSITIS 02/28/2010  . NAUSEA 02/28/2010  . Pain in right ankle and joints of right foot 02/15/2010  . URI 07/09/2007  . GERD 02/12/2007  . LOW BACK PAIN 02/12/2007  . Hypertension 09/03/2006  . HOT FLASHES 09/03/2006  . INSOMNIA 09/03/2006   Past Medical History:  Diagnosis Date  . Anemia    as a younger woman  . Arthritis    "all over my body" (10/01/2017)  . Chronic pain    In pain clinic   . Constipation   . Depression   . Diabetic peripheral neuropathy ([place])   . GERD (gastroesophageal reflux disease)   . High cholesterol   . History of hiatal hernia   . Hypertension   . Type II diabetes mellitus ([place]) dx'd ~ 2008    Family History  Problem Relation Age of Onset  . Cancer Mother        breast  . Depression Mother        bipolar  . Breast cancer Mother 24  . Heart disease Father 17       MI  . Diabetes Brother   . Kidney disease Brother   . Hypertension Brother   . Hyperlipidemia Brother   . Stroke Brother   . Coronary artery disease Other   . Diabetes Other     Past Surgical History:  Procedure Laterality Date  . AMPUTATION Right 09/29/2017   Procedure:  AMPUTATION RIGHT 1ST RAY;  Surgeon: Leandrew Koyanagi, MD;  Location: [facility];  Service: Orthopedics;  Laterality: Right;  . AMPUTATION Right 01/29/2019   Procedure: RIGHT TRANSMETATARSAL AMPUTATION;  Surgeon: Newt Minion, MD;  Location: [facility];  Service: Orthopedics;  Laterality: Right;  . AMPUTATION TOE Right 04/24/2018   Procedure: RIGHT 2ND TOE PROXIMAL INTERPHALANGEAL JOINT DISARTICULATION;  Surgeon: Leandrew Koyanagi, MD;  Location: [facility];  Service: Orthopedics;  Laterality: Right;  . APPLICATION OF WOUND VAC Right 09/29/2017   "foot"  . COLONOSCOPY    . I & D EXTREMITY Right 08/10/2017   Procedure: IRRIGATION AND  DEBRIDEMENT FOOT;  Surgeon: Leandrew Koyanagi, MD;  Location: [facility];  Service: Orthopedics;  Laterality: Right;  . [place]  . POSTERIOR LUMBAR FUSION  1996  . TOE SURGERY Right 1980s   bone removed "inbetween my toes"  . TOTAL HIP ARTHROPLASTY Left 11/16/2018   Procedure: LEFT TOTAL HIP ARTHROPLASTY ANTERIOR APPROACH;  Surgeon: Leandrew Koyanagi, MD;  Location: [facility];  Service: Orthopedics;  Laterality: Left;  [name] VAGINAL HYSTERECTOMY     Social History   Occupational History  . Not on file  Tobacco Use  . Smoking status: Never Smoker  . Smokeless tobacco: Never Used  Substance and Sexual Activity  . Alcohol use: Never  . Drug use: Never  . Sexual activity: Not Currently    Partners: Male

## 2019-06-14 ENCOUNTER — Ambulatory Visit: Payer: Medicare HMO | Admitting: Orthopedic Surgery

## 2019-06-16 ENCOUNTER — Other Ambulatory Visit: Payer: Self-pay

## 2019-06-16 DIAGNOSIS — E1165 Type 2 diabetes mellitus with hyperglycemia: Secondary | ICD-10-CM

## 2019-06-16 DIAGNOSIS — I10 Essential (primary) hypertension: Secondary | ICD-10-CM

## 2019-06-16 DIAGNOSIS — E1169 Type 2 diabetes mellitus with other specified complication: Secondary | ICD-10-CM

## 2019-06-17 ENCOUNTER — Ambulatory Visit: Payer: Medicare HMO | Admitting: Orthopedic Surgery

## 2019-06-20 ENCOUNTER — Other Ambulatory Visit: Payer: Self-pay | Admitting: Family Medicine

## 2019-06-20 DIAGNOSIS — F411 Generalized anxiety disorder: Secondary | ICD-10-CM

## 2019-06-22 ENCOUNTER — Ambulatory Visit: Payer: Medicare HMO | Admitting: Orthopedic Surgery

## 2019-06-24 ENCOUNTER — Telehealth: Payer: Medicare HMO

## 2019-06-24 ENCOUNTER — Other Ambulatory Visit: Payer: Self-pay

## 2019-06-24 NOTE — Chronic Care Management (AMB) (Deleted)
Chronic Care Management Pharmacy  Name: Brittany [name]  MRN: [id number] DOB: [birth year]  Chief Complaint/ HPI  Brittany [name],  74 y.o. , female presents for their Initial CCM visit with the clinical pharmacist via telephone due to COVID-19 Pandemic.  PCP : Ann Held, DO  Their chronic conditions include: {CHL AMB CHRONIC MEDICAL CONDITIONS:[phone]}  Office Visits: 03/18/19: Visit w/ Dr. Etter Sjogren - Follow up for diabetes, lipids, and BP. Labs ordered (cmp, a1c, lipid, ma/cr). All controlled. Continue current medications.   Consult Visit: 06/10/19: Orthopedic surgery visit w/ Dr. Sharol Given - Patient has blistering cellulitis of L foot great toe and 2nd toe extending to the midfoot. Popped blister with pen. Had bloody drainage. 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 No clinical signs of ostemyelitis. Px doxycycline as oral antibiotic and bactroban as topical antibiotic. Given postoperative shoe with minimal weightbearing. Return in 1 week  03/02/19: Orthopedic surgery visit w/ Dondra Prader, NP - Follow up post transmetatarsal amputation. Well-healed. Discussed footwear options to include orthopedic with spacer. Pt happy with her sock. Would like to see PT for gait training/instability.   02/09/19: Orthopedic surgery visit w/ Dondra Prader, NP - Follow up post transmetatarsal amputation. Progressing well. Return in 2 weeks.   01/29/19: Surgery for transmetatarsal amputation with Dr. Sharol Given  01/25/19: Orthopedic surgery visit w/ Dr. Sharol Given - Patient with osteomyelitis treated with doxycycline. Status post partial amputation of great toe.  In need of transmetatarsal amputation  01/19/19: Orthopedic surgery visit w/ Dr. Erlinda Hong - New problem of right 3rd toe ulcer. Given mupirocin ointment and doxycycline with referral to wound center. Recheck in 3 weeks.   Medications: Outpatient Encounter Medications as of 06/24/2019  Medication Sig  . amLODipine (NORVASC) 5 MG tablet TAKE  1 TABLET DAILY  . aspirin EC 81 MG tablet Take 1 tablet (81 mg total) by mouth 2 (two) times daily. (Patient taking differently: Take 81 mg by mouth at bedtime. )  . atorvastatin (LIPITOR) 10 MG tablet Take 1 tablet (10 mg total) by mouth daily.  . bisoprolol-hydrochlorothiazide (ZIAC) 10-6.25 MG tablet TAKE 1 TABLET DAILY  . Blood Glucose Monitoring Suppl (ONE TOUCH ULTRA MINI) w/Device KIT Use as directed once a Kali Deadwyler.  Dx Code:  E11.42  . calcium carbonate (TUMS - DOSED IN MG ELEMENTAL CALCIUM) 500 MG chewable tablet Chew 1 tablet by mouth as needed for indigestion or heartburn.  . docusate sodium (COLACE) 100 MG capsule Take 100 mg by mouth 2 (two) times daily.  [name] doxycycline (VIBRA-TABS) 100 MG tablet Take 1 tablet (100 mg total) by mouth 2 (two) times daily.  [name] doxycycline (VIBRA-TABS) 100 MG tablet Take 1 tablet (100 mg total) by mouth 2 (two) times daily.  [name] escitalopram (LEXAPRO) 10 MG tablet TAKE 1 TABLET DAILY  . estradiol (ESTRACE) 1 MG tablet TAKE 1 TABLET DAILY  . ferrous sulfate 325 (65 FE) MG tablet Take 325 mg by mouth daily with breakfast.  . folic acid (FOLVITE) 035 MCG tablet Take 800 mcg by mouth daily.   . furosemide (LASIX) 20 MG tablet Take 1 tablet (20 mg total) by mouth daily.  [name] gabapentin (NEURONTIN) 800 MG tablet Take 1 tablet (800 mg total) by mouth 4 (four) times daily.  [name] glucose blood (ONE TOUCH ULTRA TEST) test strip Check Blood sugar once daily  . KLOR-CON M20 20 MEQ tablet TAKE 1 TABLET BY MOUTH ONCE DAILY **PT  NEEDS  OFFICE  VISIT**  . methocarbamol (ROBAXIN) 750  MG tablet Take 1 tablet (750 mg total) by mouth 2 (two) times daily as needed for muscle spasms.  [name] morphine (MS CONTIN) 60 MG 12 hr tablet Take 60 mg by mouth every 12 (twelve) hours.   . Multiple Vitamins-Minerals (MULTIVITAMIN GUMMIES WOMENS PO) Take 1 tablet by mouth daily.   . Omega-3 Fatty Acids (FISH OIL) 1000 MG CAPS Take 1,000 mg by mouth daily.  [name] omeprazole (PRILOSEC) 40 MG capsule TAKE 1  CAPSULE DAILY.      PLEASE MAKE AN APPOINTMENT WITH YOUR DOCTOR  . ONETOUCH DELICA LANCETS 19F MISC Check blood sugar once daily  . promethazine (PHENERGAN) 25 MG tablet Take 1 tablet (25 mg total) by mouth every 6 (six) hours as needed for nausea.  [name] senna-docusate (SENOKOT S) 8.6-50 MG tablet Take 1-2 tablets by mouth at bedtime as needed.  . sitaGLIPtin-metformin (JANUMET) 50-1000 MG tablet TAKE 2 TABLETS AT BEDTIME. PLEASE MAKE AN APPOINTMENT WITH YOUR DOCTOR  . Specialty Vitamins Products (BIOTIN PLUS KERATIN) 10000-100 MCG-MG TABS Take 1 tablet by mouth daily.  [name] tiZANidine (ZANAFLEX) 4 MG tablet Take 4 mg by mouth 3 (three) times daily.    No facility-administered encounter medications on file as of 06/24/2019.     Current Diagnosis/Assessment:  Goals Addressed   None     Diabetes   Recent Relevant Labs: Lab Results  Component Value Date/Time   HGBA1C 6.6 (H) 05/20/2019 10:42 AM   HGBA1C 6.3 (H) 11/12/2018 11:41 AM   MICROALBUR 0.9 05/20/2019 10:42 AM   MICROALBUR 1.2 05/15/2017 09:28 AM     Checking BG: {CHL HP Blood Glucose Monitoring Frequency:[id number]}  Recent FBG Readings: Recent pre-meal BG readings: *** Recent 2hr PP BG readings:  *** Recent HS BG readings: *** Patient has failed these meds in past: *** Patient is currently controlled on the following medications: ***  Last diabetic Foot exam: No results found for: HMDIABEYEEXA  Last diabetic Eye exam: No results found for: HMDIABFOOTEX   We discussed: {CHL HP Upstream Pharmacy [phone]}  Plan  Continue {CHL HP Upstream Pharmacy Plans:[phone]}   Hypertension   BP today is:  {CHL HP UPSTREAM Pharmacist BP [phone]}  Office blood pressures are  BP Readings from Last 3 Encounters:  03/18/19 140/60  01/29/19 (!) 124/46  11/19/18 (!) 133/47    Patient has failed these meds in the past: ***  Patient checks BP at home {CHL HP BP Monitoring [phone]}  Patient  home BP readings are ranging: ***  We discussed {CHL HP Upstream Pharmacy [phone]}  Plan  Continue {CHL HP Upstream Pharmacy Plans:[phone]}   Hyperlipidemia   Lipid Panel     Component Value Date/Time   CHOL 167 05/20/2019 1042   TRIG 88.0 05/20/2019 1042   HDL 84.40 05/20/2019 1042   CHOLHDL 2 05/20/2019 1042   VLDL 17.6 05/20/2019 1042   LDLCALC 65 05/20/2019 1042     The 10-year ASCVD risk score (Goff DC Jr., et al., 2013) is: 26.8%   Values used to calculate the score:     Age: 47 years     Sex: Female     Is Non-Hispanic African American: No     Diabetic: Yes     Tobacco smoker: No     Systolic Blood Pressure: 790 mmHg     Is BP treated: Yes     HDL Cholesterol: 84.4 mg/dL     Total Cholesterol: 167 mg/dL   Patient has failed these meds in past: *** Patient is currently controlled  on the following medications: ***  We discussed:  {CHL HP Upstream Pharmacy discussion:[phone]}  Plan  Continue {CHL HP Upstream Pharmacy [phone]}  Anxiety    Patient has failed these meds in past: *** Patient is currently {CHL Controlled/Uncontrolled:[phone]} on the following medications: ***  We discussed:  {CHL HP Upstream Pharmacy discussion:[phone]}  Plan  Continue {CHL HP Upstream Pharmacy [phone]}   Pain    Patient has failed these meds in past: *** Patient is currently {CHL Controlled/Uncontrolled:[phone]} on the following medications: ***  We discussed:  {CHL HP Upstream Pharmacy discussion:[phone]}  Plan  Continue {CHL HP Upstream Pharmacy [phone]}   Neuropathy    Patient has failed these meds in past: *** Patient is currently {CHL Controlled/Uncontrolled:[phone]} on the following medications: ***  We discussed:  {CHL HP Upstream Pharmacy discussion:[phone]}  Plan  Continue {CHL HP Upstream Pharmacy [phone]}   Constipation    Patient has failed these meds in past: ***  Patient is currently {CHL Controlled/Uncontrolled:[phone]} on the following medications: ***  We discussed:  {CHL HP Upstream Pharmacy discussion:[phone]}  Plan  Continue {CHL HP Upstream Pharmacy LEXNT:[id number]}    Miscellaneous Meds [id number]

## 2019-06-25 ENCOUNTER — Encounter: Payer: Self-pay | Admitting: Family Medicine

## 2019-06-25 ENCOUNTER — Other Ambulatory Visit: Payer: Self-pay

## 2019-06-25 ENCOUNTER — Ambulatory Visit (INDEPENDENT_AMBULATORY_CARE_PROVIDER_SITE_OTHER): Payer: Medicare HMO | Admitting: Family Medicine

## 2019-06-25 VITALS — BP 115/64 | HR 98 | Temp 97.2°F | Resp 16 | Ht 69.0 in | Wt 179.0 lb

## 2019-06-25 DIAGNOSIS — I10 Essential (primary) hypertension: Secondary | ICD-10-CM

## 2019-06-25 DIAGNOSIS — E785 Hyperlipidemia, unspecified: Secondary | ICD-10-CM

## 2019-06-25 DIAGNOSIS — E1151 Type 2 diabetes mellitus with diabetic peripheral angiopathy without gangrene: Secondary | ICD-10-CM | POA: Diagnosis not present

## 2019-06-25 DIAGNOSIS — F411 Generalized anxiety disorder: Secondary | ICD-10-CM

## 2019-06-25 DIAGNOSIS — E1169 Type 2 diabetes mellitus with other specified complication: Secondary | ICD-10-CM

## 2019-06-25 DIAGNOSIS — R609 Edema, unspecified: Secondary | ICD-10-CM | POA: Diagnosis not present

## 2019-06-25 DIAGNOSIS — E1165 Type 2 diabetes mellitus with hyperglycemia: Secondary | ICD-10-CM

## 2019-06-25 DIAGNOSIS — Z780 Asymptomatic menopausal state: Secondary | ICD-10-CM | POA: Diagnosis not present

## 2019-06-25 DIAGNOSIS — R69 Illness, unspecified: Secondary | ICD-10-CM | POA: Diagnosis not present

## 2019-06-25 DIAGNOSIS — IMO0002 Reserved for concepts with insufficient information to code with codable children: Secondary | ICD-10-CM

## 2019-06-25 DIAGNOSIS — K219 Gastro-esophageal reflux disease without esophagitis: Secondary | ICD-10-CM | POA: Diagnosis not present

## 2019-06-25 LAB — COMPREHENSIVE METABOLIC PANEL
ALT: 16 U/L (ref 0–35)
AST: 15 U/L (ref 0–37)
Albumin: 3.5 g/dL (ref 3.5–5.2)
Alkaline Phosphatase: 54 U/L (ref 39–117)
BUN: 15 mg/dL (ref 6–23)
CO2: 33 mEq/L — ABNORMAL HIGH (ref 19–32)
Calcium: 9 mg/dL (ref 8.4–10.5)
Chloride: 102 mEq/L (ref 96–112)
Creatinine, Ser: 0.69 mg/dL (ref 0.40–1.20)
GFR: 83.81 mL/min (ref >60.00)
Glucose, Bld: 101 mg/dL — ABNORMAL HIGH (ref 70–99)
Potassium: 4.3 mEq/L (ref 3.5–5.1)
Sodium: 141 mEq/L (ref 135–145)
Total Bilirubin: 0.3 mg/dL (ref 0.2–1.2)
Total Protein: 6.5 g/dL (ref 6.0–8.3)

## 2019-06-25 LAB — LIPID PANEL
Cholesterol: 132 mg/dL (ref 0–200)
HDL: 68.8 mg/dL (ref >39.00)
LDL Cholesterol: 51 mg/dL (ref 0–99)
NonHDL: 62.83
Total CHOL/HDL Ratio: 2
Triglycerides: 60 mg/dL (ref 0.0–149.0)
VLDL: 12 mg/dL (ref 0.0–40.0)

## 2019-06-25 LAB — MICROALBUMIN / CREATININE URINE RATIO
Creatinine,U: 112.5 mg/dL
Microalb Creat Ratio: 0.6 mg/g (ref 0.0–30.0)
Microalb, Ur: <0.7 mg/dL (ref 0.0–1.9)

## 2019-06-25 LAB — HEMOGLOBIN A1C: Hgb A1c MFr Bld: 6.5 % (ref 4.6–6.5)

## 2019-06-25 MED ORDER — OMEPRAZOLE 40 MG PO CPDR: DELAYED_RELEASE_CAPSULE | ORAL | 3 refills | Status: DC

## 2019-06-25 MED ORDER — ESTRADIOL 1 MG PO TABS: 1.0000 mg | ORAL_TABLET | Freq: Every day | ORAL | 3 refills | Status: DC

## 2019-06-25 MED ORDER — ATORVASTATIN CALCIUM 10 MG PO TABS: 10.0000 mg | ORAL_TABLET | Freq: Every day | ORAL | 3 refills | Status: DC

## 2019-06-25 MED ORDER — FUROSEMIDE 20 MG PO TABS: 20.0000 mg | ORAL_TABLET | Freq: Every day | ORAL | 3 refills | Status: DC

## 2019-06-25 MED ORDER — BISOPROLOL-HYDROCHLOROTHIAZIDE 10-6.25 MG PO TABS: 1.0000 | ORAL_TABLET | Freq: Every day | ORAL | 1 refills | Status: DC

## 2019-06-25 MED ORDER — AMLODIPINE BESYLATE 5 MG PO TABS: 5.0000 mg | ORAL_TABLET | Freq: Every day | ORAL | 3 refills | Status: DC

## 2019-06-25 MED ORDER — ESCITALOPRAM OXALATE 10 MG PO TABS: 10.0000 mg | ORAL_TABLET | Freq: Every day | ORAL | 3 refills | Status: DC

## 2019-06-25 MED ORDER — KLOR-CON M20 20 MEQ PO TBCR: EXTENDED_RELEASE_TABLET | ORAL | 3 refills | Status: DC

## 2019-06-25 NOTE — Assessment & Plan Note (Signed)
Check labs hgba1c to be checked.  minimize simple carbs. Increase exercise as tolerated. Continue current meds  

## 2019-06-25 NOTE — Patient Instructions (Signed)
Carbohydrate Counting for Diabetes Mellitus, Adult  Carbohydrate counting is a method of keeping track of how many carbohydrates you eat. Eating carbohydrates naturally increases the amount of sugar (glucose) in the blood. Counting how many carbohydrates you eat helps keep your blood glucose within normal limits, which helps you manage your diabetes (diabetes mellitus). It is important to know how many carbohydrates you can safely have in each meal. This is different for every person. A diet and nutrition specialist (registered dietitian) can help you make a meal plan and calculate how many carbohydrates you should have at each meal and snack. Carbohydrates are found in the following foods:  Grains, such as breads and cereals.  Dried beans and soy products.  Starchy vegetables, such as potatoes, peas, and corn.  Fruit and fruit juices.  Milk and yogurt.  Sweets and snack foods, such as cake, cookies, candy, chips, and soft drinks. How do I count carbohydrates? There are two ways to count carbohydrates in food. You can use either of the methods or a combination of both. Reading "Nutrition Facts" on packaged food The "Nutrition Facts" list is included on the labels of almost all packaged foods and beverages in the [place] It includes:  The serving size.  Information about nutrients in each serving, including the grams (g) of carbohydrate per serving. To use the "Nutrition Facts":  Decide how many servings you will have.  Multiply the number of servings by the number of carbohydrates per serving.  The resulting number is the total amount of carbohydrates that you will be having. Learning standard serving sizes of other foods When you eat carbohydrate foods that are not packaged or do not include "Nutrition Facts" on the label, you need to measure the servings in order to count the amount of carbohydrates:  Measure the foods that you will eat with a food scale or measuring cup, if  needed.  Decide how many standard-size servings you will eat.  Multiply the number of servings by 15. Most carbohydrate-rich foods have about 15 g of carbohydrates per serving. ? For example, if you eat 8 oz (170 g) of strawberries, you will have eaten 2 servings and 30 g of carbohydrates (2 servings x 15 g = 30 g).  For foods that have more than one food mixed, such as soups and casseroles, you must count the carbohydrates in each food that is included. The following list contains standard serving sizes of common carbohydrate-rich foods. Each of these servings has about 15 g of carbohydrates:   hamburger bun or  English muffin.   oz (15 mL) syrup.   oz (14 g) jelly.  1 slice of bread.  1 six-inch tortilla.  3 oz (85 g) cooked rice or pasta.  4 oz (113 g) cooked dried beans.  4 oz (113 g) starchy vegetable, such as peas, corn, or potatoes.  4 oz (113 g) hot cereal.  4 oz (113 g) mashed potatoes or  of a large baked potato.  4 oz (113 g) canned or frozen fruit.  4 oz (120 mL) fruit juice.  4-6 crackers.  6 chicken nuggets.  6 oz (170 g) unsweetened dry cereal.  6 oz (170 g) plain fat-free yogurt or yogurt sweetened with artificial sweeteners.  8 oz (240 mL) milk.  8 oz (170 g) fresh fruit or one small piece of fruit.  24 oz (680 g) popped popcorn. Example of carbohydrate counting Sample meal  3 oz (85 g) chicken breast.  6 oz (170 g)   brown rice.  4 oz (113 g) corn.  8 oz (240 mL) milk.  8 oz (170 g) strawberries with sugar-free whipped topping. Carbohydrate calculation 1. Identify the foods that contain carbohydrates: ? Rice. ? Corn. ? Milk. ? Strawberries. 2. Calculate how many servings you have of each food: ? 2 servings rice. ? 1 serving corn. ? 1 serving milk. ? 1 serving strawberries. 3. Multiply each number of servings by 15 g: ? 2 servings rice x 15 g = 30 g. ? 1 serving corn x 15 g = 15 g. ? 1 serving milk x 15 g = 15 g. ? 1  serving strawberries x 15 g = 15 g. 4. Add together all of the amounts to find the total grams of carbohydrates eaten: ? 30 g + 15 g + 15 g + 15 g = 75 g of carbohydrates total. Summary  Carbohydrate counting is a method of keeping track of how many carbohydrates you eat.  Eating carbohydrates naturally increases the amount of sugar (glucose) in the blood.  Counting how many carbohydrates you eat helps keep your blood glucose within normal limits, which helps you manage your diabetes.  A diet and nutrition specialist (registered dietitian) can help you make a meal plan and calculate how many carbohydrates you should have at each meal and snack. This information is not intended to replace advice given to you by your health care provider. Make sure you discuss any questions you have with your health care provider. Document Revised: 10/17/2016 Document Reviewed: 09/06/2015 Elsevier Patient Education  2020 Elsevier Inc.  

## 2019-06-25 NOTE — Assessment & Plan Note (Signed)
Well controlled, no changes to meds. Encouraged heart healthy diet such as the DASH diet and exercise as tolerated.  °

## 2019-06-25 NOTE — Assessment & Plan Note (Signed)
Encouraged heart healthy diet, increase exercise, avoid trans fats, consider a krill oil cap daily 

## 2019-06-25 NOTE — Progress Notes (Signed)
Patient ID: Brittany [name], female    DOB: Jun 28, 1945  Age: 74 y.o. MRN: [id number]    Subjective:  Subjective  HPI Brittany [name] presents for f/u dm, chol and bp.  No complaints She is seeing podiatry for her L foot sore and it is healing.   HYPERTENSION   Blood pressure range-not checking   Chest pain- no      Dyspnea- no Lightheadedness- no   Edema- no Other side effects - no   Medication compliance: good Low salt diet- yes    DIABETES    Blood Sugar ranges-good per pt --- 130-140  Polyuria- no New Visual problems- no  Hypoglycemic symptoms- no  Other side effects-no Medication compliance - good Last eye exam- due Foot exam- per podiatry   HYPERLIPIDEMIA  Medication compliance- good RUQ pain- no  Muscle aches- no Other side effects-no      Review of Systems  Constitutional: Negative for appetite change, diaphoresis, fatigue and unexpected weight change.  Eyes: Negative for pain, redness and visual disturbance.  Respiratory: Negative for cough, chest tightness, shortness of breath and wheezing.   Cardiovascular: Negative for chest pain, palpitations and leg swelling.  Endocrine: Negative for cold intolerance, heat intolerance, polydipsia, polyphagia and polyuria.  Genitourinary: Negative for difficulty urinating, dysuria and frequency.  Neurological: Negative for dizziness, light-headedness, numbness and headaches.    History Past Medical History:  Diagnosis Date  . Anemia    as a younger woman  . Arthritis    "all over my body" (10/01/2017)  . Chronic pain    In pain clinic   . Constipation   . Depression   . Diabetic peripheral neuropathy ([place])   . GERD (gastroesophageal reflux disease)   . High cholesterol   . History of hiatal hernia   . Hypertension   . Type II diabetes mellitus ([place]) dx'd ~ 2008    She has a past surgical history that includes I & D extremity (Right, 08/10/2017); Toe Surgery (Right, 1980s); Colonoscopy; Amputation (Right,  09/29/2017); Lumbar disc surgery (1992); Posterior lumbar fusion (1996); Vaginal hysterectomy; Application if wound vac (Right, 09/29/2017); Amputation toe (Right, 04/24/2018); Total hip arthroplasty (Left, 11/16/2018); and Amputation (Right, 01/29/2019).   Her family history includes Breast cancer (age of onset: 43) in her mother; Cancer in her mother; Coronary artery disease in an other family member; Depression in her mother; Diabetes in her brother and another family member; Heart disease (age of onset: 41) in her father; Hyperlipidemia in her brother; Hypertension in her brother; Kidney disease in her brother; Stroke in her brother.She reports that she has never smoked. She has never used smokeless tobacco. She reports that she does not drink alcohol or use drugs.  Current Outpatient Medications on File Prior to Visit  Medication Sig Dispense Refill  . aspirin EC 81 MG tablet Take 1 tablet (81 mg total) by mouth 2 (two) times daily. (Patient taking differently: Take 81 mg by mouth at bedtime. ) 84 tablet 0  . Blood Glucose Monitoring Suppl (ONE TOUCH ULTRA MINI) w/Device KIT Use as directed once a day.  Dx Code:  E11.42 1 each 0  . calcium carbonate (TUMS - DOSED IN MG ELEMENTAL CALCIUM) 500 MG chewable tablet Chew 1 tablet by mouth as needed for indigestion or heartburn.    . docusate sodium (COLACE) 100 MG capsule Take 100 mg by mouth 2 (two) times daily.    . ferrous sulfate 325 (65 FE) MG tablet Take 325 mg by mouth daily  with breakfast.    . folic acid (FOLVITE) 585 MCG tablet Take 800 mcg by mouth daily.     [name] gabapentin (NEURONTIN) 800 MG tablet Take 1 tablet (800 mg total) by mouth 4 (four) times daily. 360 tablet 3  . glucose blood (ONE TOUCH ULTRA TEST) test strip Check Blood sugar once daily 100 each 12  . methocarbamol (ROBAXIN) 750 MG tablet Take 1 tablet (750 mg total) by mouth 2 (two) times daily as needed for muscle spasms. 60 tablet 0  . morphine (MS CONTIN) 60 MG 12 hr tablet Take  60 mg by mouth every 12 (twelve) hours.   0  . Multiple Vitamins-Minerals (MULTIVITAMIN GUMMIES WOMENS PO) Take 1 tablet by mouth daily.     [name] DELICA LANCETS 27P MISC Check blood sugar once daily 100 each 12  . sitaGLIPtin-metformin (JANUMET) 50-1000 MG tablet TAKE 2 TABLETS AT BEDTIME. PLEASE MAKE AN APPOINTMENT WITH YOUR DOCTOR 180 tablet 1  . Specialty Vitamins Products (BIOTIN PLUS KERATIN) 10000-100 MCG-MG TABS Take 1 tablet by mouth daily.    [name] tiZANidine (ZANAFLEX) 4 MG tablet Take 4 mg by mouth 3 (three) times daily.      No current facility-administered medications on file prior to visit.     Objective:  Objective  Physical Exam Vitals and nursing note reviewed.  Constitutional:      Appearance: She is well-developed.  HENT:     Head: Normocephalic and atraumatic.  Eyes:     Conjunctiva/sclera: Conjunctivae normal.  Neck:     Thyroid: No thyromegaly.     Vascular: No carotid bruit or JVD.  Cardiovascular:     Rate and Rhythm: Normal rate and regular rhythm.     Heart sounds: Normal heart sounds. No murmur.  Pulmonary:     Effort: Pulmonary effort is normal. No respiratory distress.     Breath sounds: Normal breath sounds. No wheezing or rales.  Chest:     Chest wall: No tenderness.  Musculoskeletal:     Cervical back: Normal range of motion and neck supple.  Neurological:     Mental Status: She is alert and oriented to person, place, and time.      BP 115/64 (BP Location: Right Arm, Patient Position: Sitting, Cuff Size: Normal)   Pulse 98   Temp (!) 97.2 F (36.2 C) (Temporal)   Resp 16   Ht _0  (1.753 m)   Wt 179 lb (81.2 kg)   SpO2 97%   BMI 26.43 kg/m  Wt Readings from Last 3 Encounters:  06/25/19 179 lb (81.2 kg)  06/10/19 173 lb (78.5 kg)  03/18/19 173 lb (78.5 kg)     Lab Results  Component Value Date   WBC 10.6 (H) 01/29/2019   HGB 12.2 01/29/2019   HCT 39.6 01/29/2019   PLT 244 01/29/2019   GLUCOSE 96 05/20/2019   CHOL 167  05/20/2019   TRIG 88.0 05/20/2019   HDL 84.40 05/20/2019   LDLCALC 65 05/20/2019   ALT 15 05/20/2019   AST 17 05/20/2019   NA 140 05/20/2019   K 4.0 05/20/2019   CL 102 05/20/2019   CREATININE 0.77 05/20/2019   BUN 15 05/20/2019   CO2 33 (H) 05/20/2019   TSH 1.39 05/15/2017   INR 1.0 11/12/2018   HGBA1C 6.6 (H) 05/20/2019   MICROALBUR 0.9 05/20/2019    No results found.   Assessment & Plan:  Plan  I have discontinued Elesia Pemberton. Lasseigne's Fish Oil, promethazine, senna-docusate, doxycycline, and  doxycycline. I have also changed her estradiol, bisoprolol-hydrochlorothiazide, Klor-Con M20, escitalopram, and amLODipine. Additionally, I am having her maintain her tiZANidine, docusate sodium, folic acid, OneTouch Delica Lancets 81X, glucose blood, ONE TOUCH ULTRA MINI, Multiple Vitamins-Minerals (MULTIVITAMIN GUMMIES WOMENS PO), Biotin Plus Keratin, morphine, gabapentin, ferrous sulfate, aspirin EC, methocarbamol, calcium carbonate, Janumet, furosemide, atorvastatin, and omeprazole.  Meds ordered this encounter  Medications  . furosemide (LASIX) 20 MG tablet    Sig: Take 1 tablet (20 mg total) by mouth daily.    Dispense:  90 tablet    Refill:  3  . estradiol (ESTRACE) 1 MG tablet    Sig: Take 1 tablet (1 mg total) by mouth daily.    Dispense:  90 tablet    Refill:  3  . bisoprolol-hydrochlorothiazide (ZIAC) 10-6.25 MG tablet    Sig: Take 1 tablet by mouth daily.    Dispense:  90 tablet    Refill:  1  . KLOR-CON M20 20 MEQ tablet    Sig: 1 po qd    Dispense:  90 tablet    Refill:  3  . escitalopram (LEXAPRO) 10 MG tablet    Sig: Take 1 tablet (10 mg total) by mouth daily.    Dispense:  90 tablet    Refill:  3  . atorvastatin (LIPITOR) 10 MG tablet    Sig: Take 1 tablet (10 mg total) by mouth daily.    Dispense:  90 tablet    Refill:  3  . amLODipine (NORVASC) 5 MG tablet    Sig: Take 1 tablet (5 mg total) by mouth daily.    Dispense:  90 tablet    Refill:  3    Pt needs  apointment  . omeprazole (PRILOSEC) 40 MG capsule    Sig: TAKE 1 CAPSULE DAILY.      PLEASE MAKE AN APPOINTMENT WITH YOUR DOCTOR    Dispense:  90 capsule    Refill:  3    Problem List Items Addressed This Visit      Unprioritized   DM (diabetes mellitus) type II uncontrolled, periph vascular disorder ([place])    Check labs hgba1c to be checked  minimize simple carbs. Increase exercise as tolerated. Continue current meds      Relevant Medications   furosemide (LASIX) 20 MG tablet   bisoprolol-hydrochlorothiazide (ZIAC) 10-6.25 MG tablet   atorvastatin (LIPITOR) 10 MG tablet   amLODipine (NORVASC) 5 MG tablet   Edema   Relevant Medications   furosemide (LASIX) 20 MG tablet   Generalized anxiety disorder   Relevant Medications   escitalopram (LEXAPRO) 10 MG tablet   Hyperlipidemia associated with type 2 diabetes mellitus ([place]) - Primary    Encouraged heart healthy diet, increase exercise, avoid trans fats, consider a krill oil cap daily      Relevant Medications   bisoprolol-hydrochlorothiazide (ZIAC) 10-6.25 MG tablet   atorvastatin (LIPITOR) 10 MG tablet   Other Relevant Orders   Lipid panel   Comprehensive metabolic panel   Hypertension    Well controlled, no changes to meds. Encouraged heart healthy diet such as the DASH diet and exercise as tolerated.       Relevant Medications   furosemide (LASIX) 20 MG tablet   bisoprolol-hydrochlorothiazide (ZIAC) 10-6.25 MG tablet   KLOR-CON M20 20 MEQ tablet   atorvastatin (LIPITOR) 10 MG tablet   amLODipine (NORVASC) 5 MG tablet   Other Relevant Orders   Comprehensive metabolic panel   Menopause   Relevant Medications   estradiol (ESTRACE)  1 MG tablet    Other Visit Diagnoses    Uncontrolled type 2 diabetes mellitus with hyperglycemia ([facility])       Relevant Medications   bisoprolol-hydrochlorothiazide (ZIAC) 10-6.25 MG tablet   atorvastatin (LIPITOR) 10 MG tablet   Other Relevant Orders   Hemoglobin A1c   Comprehensive  metabolic panel   Microalbumin / creatinine urine ratio   Gastroesophageal reflux disease       Relevant Medications   omeprazole (PRILOSEC) 40 MG capsule      Follow-up: Return in about 6 months (around 12/26/2019) for hypertension, hyperlipidemia, diabetes II, annual exam, fasting.  Ann Held, DO

## 2019-06-28 ENCOUNTER — Encounter: Payer: Self-pay | Admitting: Orthopedic Surgery

## 2019-06-28 ENCOUNTER — Ambulatory Visit (INDEPENDENT_AMBULATORY_CARE_PROVIDER_SITE_OTHER): Payer: Medicare HMO | Admitting: Physician Assistant

## 2019-06-28 ENCOUNTER — Other Ambulatory Visit: Payer: Self-pay

## 2019-06-28 VITALS — Ht 69.0 in | Wt 179.0 lb

## 2019-06-28 DIAGNOSIS — M869 Osteomyelitis, unspecified: Secondary | ICD-10-CM

## 2019-06-28 MED ORDER — MUPIROCIN 2 % EX OINT: 1.0000 "application " | TOPICAL_OINTMENT | Freq: Every day | CUTANEOUS | 3 refills | Status: DC

## 2019-06-28 NOTE — Progress Notes (Signed)
Office Visit Note   Patient: Brittany [name]           Date of Birth: [birth year]           MRN: [id number] Visit Date: 06/28/2019              Requested by: [address], [place], [place] [address] [place],  [place] [address] PCP: Carollee Herter, Alferd Apa, DO  Chief Complaint  Patient presents with  . Left Foot - Follow-up      HPI: Patient is here in follow-up today for her left foot great and second toe.  She was seen couple weeks ago where she presented with acute blistering and cellulitis of her left foot great toe and second toe this extended to the midfoot.  She was treated with doxycycline as well as been doing daily dressing changes with Bactroban.  She thinks it is looking better denies any fever or chills  Assessment & Plan: Visit Diagnoses: No diagnosis found.  Plan: She will continue to limit her weightbearing and do daily dressing changes.  Her foot is improving at its still has some erythema but much less swollen.  She will follow up in another week  Follow-Up Instructions: No follow-ups on file.   Ortho Exam  Patient is alert, oriented, no adenopathy, well-dressed, normal affect, normal respiratory effort. Focused examination of her left foot demonstrates exfoliation of her great toe and second toe.  She does have some erythema on the toes that extends into the forefoot no fluctuance no foul odor.  Great toe and second toe have some exfoliation.  After obtaining verbal consent the great toe dead skin was carefully peeled away this revealed pink new healthy skin beneath.  Some of the skin was also debrided on the second toe.  No necrosis noted  Imaging: No results found. No images are attached to the encounter.  Labs: Lab Results  Component Value Date   HGBA1C 6.5 06/25/2019   HGBA1C 6.6 (H) 05/20/2019   HGBA1C 6.3 (H) 11/12/2018   REPTSTATUS 08/15/2017 FINAL 08/10/2017   GRAMSTAIN NO WBC SEEN NO ORGANISMS SEEN  08/10/2017   CULT  08/10/2017   No growth aerobically or anaerobically. Performed at [facility], [place] [address] [place], [place] [address]    Doy Hutching  03/11/2016    Three or more organisms present,each greater than 10,000 CFU/mL.These organisms,commonly found on external and internal genitalia,are considered to be colonizers.No further testing performed.      Lab Results  Component Value Date   ALBUMIN 3.5 06/25/2019   ALBUMIN 4.0 05/20/2019   ALBUMIN 3.6 11/12/2018    No results found for: MG No results found for: VD25OH  No results found for: PREALBUMIN CBC EXTENDED Latest Ref Rng & Units 01/29/2019 11/18/2018 11/17/2018  WBC 4.0 - 10.5 K/uL 10.6(H) 14.0(H) 10.2  RBC 3.87 - 5.11 MIL/uL 4.55 3.34(L) 3.45(L)  HGB 12.0 - 15.0 g/dL 12.2 9.6(L) 9.9(L)  HCT 36.0 - 46.0 % 39.6 28.8(L) 30.0(L)  PLT 150 - 400 K/uL 244 164 165  NEUTROABS 1.7 - 7.7 K/uL - - -  LYMPHSABS 0.7 - 4.0 K/uL - - -     Body mass index is 26.43 kg/m.  Orders:  No orders of the defined types were placed in this encounter.  Meds ordered this encounter  Medications  . mupirocin ointment (BACTROBAN) 2 %    Sig: Apply 1 application topically daily. Apply to the affected area 2 times a  day    Dispense:  22 g    Refill:  3     Procedures: No procedures performed  Clinical Data: No additional findings.  ROS:  All other systems negative, except as noted in the HPI. Review of Systems  Objective: Vital Signs: Ht 5\' 9"  (1.753 m)   Wt 179 lb (81.2 kg)   BMI 26.43 kg/m   Specialty Comments:  No specialty comments available.  PMFS History: Patient Active Problem List   Diagnosis Date Noted  . 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 Vaginal prolapse 09/30/2018  . Primary osteoarthritis of left hip 06/10/2018  . Osteomyelitis of second toe of right foot ([place]) 04/24/2018  . Osteomyelitis of right foot ([place]) 04/21/2018  . 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 Callus  01/13/2018  . Generalized anxiety disorder 10/16/2017  . Menopause 10/16/2017  . DM (diabetes mellitus) type II uncontrolled, periph vascular disorder ([place]) 10/16/2017  . Hyperlipidemia associated with type 2 diabetes mellitus ([place]) 10/16/2017  . 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 Osteomyelitis of great toe of right foot ([place]) 09/25/2017  . Pain in right foot 09/16/2017  . Cellulitis 08/08/2017  . Hypoglycemia without diagnosis of diabetes mellitus 08/08/2017  . Peripheral neuropathy 08/08/2017  . Abscess 08/08/2017  . Hyperlipidemia LDL goal <70 09/22/2016  . Tooth pain 09/03/2012  . Edema 09/03/2012  . Supraclavicular fossa fullness 06/18/2012  . Hair loss 09/18/2010  . Fatigue 09/18/2010  . DYSPEPSIA&OTHER [facility] DISORDERS FUNCTION STOMACH 05/08/2010  . DIARRHEA 05/08/2010  . DIZZINESS 04/19/2010  . OTHER ACUTE SINUSITIS 02/28/2010  . NAUSEA 02/28/2010  . Pain in right ankle and joints of right foot 02/15/2010  . URI 07/09/2007  . GERD 02/12/2007  . LOW BACK PAIN 02/12/2007  . Hypertension 09/03/2006  . HOT FLASHES 09/03/2006  . INSOMNIA 09/03/2006   Past Medical History:  Diagnosis Date  . Anemia    as a younger woman  . Arthritis    "all over my body" (10/01/2017)  . Chronic pain    In pain clinic   . Constipation   . Depression   . Diabetic peripheral neuropathy ([place])   . GERD (gastroesophageal reflux disease)   . High cholesterol   . History of hiatal hernia   . Hypertension   . Type II diabetes mellitus ([place]) dx'd ~ 2008    Family History  Problem Relation Age of Onset  . Cancer Mother        breast  . Depression Mother        bipolar  . Breast cancer Mother 52  . Heart disease Father 33       MI  . Diabetes Brother   . Kidney disease Brother   . Hypertension Brother   . Hyperlipidemia Brother   . Stroke Brother   . Coronary artery disease Other   . Diabetes Other     Past Surgical History:   Procedure Laterality Date  . AMPUTATION Right 09/29/2017   Procedure: AMPUTATION RIGHT 1ST RAY;  Surgeon: Leandrew Koyanagi, MD;  Location: [place];  Service: Orthopedics;  Laterality: Right;  . AMPUTATION Right 01/29/2019   Procedure: RIGHT TRANSMETATARSAL AMPUTATION;  Surgeon: Newt Minion, MD;  Location: [place];  Service: Orthopedics;  Laterality: Right;  . AMPUTATION TOE Right 04/24/2018   Procedure: RIGHT 2ND TOE PROXIMAL INTERPHALANGEAL JOINT DISARTICULATION;  Surgeon: Leandrew Koyanagi, MD;  Location: [place]  [place];  Service: Orthopedics;  Laterality: Right;  . APPLICATION OF WOUND VAC Right 09/29/2017   "foot"  . COLONOSCOPY    . I & D EXTREMITY Right 08/10/2017   Procedure: IRRIGATION AND DEBRIDEMENT FOOT;  Surgeon: Leandrew Koyanagi, MD;  Location: [place];  Service: Orthopedics;  Laterality: Right;  . [place]  . POSTERIOR LUMBAR FUSION  1996  . TOE SURGERY Right 1980s   bone removed "inbetween my toes"  . TOTAL HIP ARTHROPLASTY Left 11/16/2018   Procedure: LEFT TOTAL HIP ARTHROPLASTY ANTERIOR APPROACH;  Surgeon: Leandrew Koyanagi, MD;  Location: [place];  Service: Orthopedics;  Laterality: Left;  [name] VAGINAL HYSTERECTOMY     Social History   Occupational History  . Not on file  Tobacco Use  . Smoking status: Never Smoker  . Smokeless tobacco: Never Used  Substance and Sexual Activity  . Alcohol use: Never  . Drug use: Never  . Sexual activity: Not Currently    Partners: Male

## 2019-07-05 ENCOUNTER — Ambulatory Visit: Payer: Medicare HMO | Admitting: Physician Assistant

## 2019-07-13 ENCOUNTER — Other Ambulatory Visit: Payer: Self-pay

## 2019-07-13 ENCOUNTER — Ambulatory Visit (INDEPENDENT_AMBULATORY_CARE_PROVIDER_SITE_OTHER): Payer: Medicare HMO | Admitting: Physician Assistant

## 2019-07-13 ENCOUNTER — Encounter: Payer: Self-pay | Admitting: Physician Assistant

## 2019-07-13 VITALS — Ht 69.0 in | Wt 179.0 lb

## 2019-07-13 DIAGNOSIS — L97521 Non-pressure chronic ulcer of other part of left foot limited to breakdown of skin: Secondary | ICD-10-CM | POA: Insufficient documentation

## 2019-07-13 DIAGNOSIS — L03116 Cellulitis of left lower limb: Secondary | ICD-10-CM | POA: Diagnosis not present

## 2019-07-13 NOTE — Progress Notes (Signed)
Office Visit Note   Patient: Brittany [name]           Date of Birth: February 26, 1946           MRN: [id number] Visit Date: 07/13/2019              Requested by: [address], [place], [place] [address] [place],  [place] [address] PCP: Carollee Herter, Alferd Apa, DO  Chief Complaint  Patient presents with  . Left Foot - Follow-up      HPI: Patient is a 76 who presents today in follow-up for cellulitis and ulceration to her left foot.  She has been having issues with her great toe as well as her second toe left foot with some ulcers in the webspace.  She has been weightbearing in a postop shoe doing mupirocin dressing changes daily.  She has tenderness today pleased with resolution of the infection.     Assessment & Plan: Visit Diagnoses: No diagnosis found.  Plan: Have given her toe spacer she will continue with daily dose of cleansing and mupirocin dressing changes of the second toe continue to closely monitor the toe she will follow-up in 2 weeks.    Follow-Up Instructions: No follow-ups on file.   Ortho Exam  Patient is alert, oriented, no adenopathy, well-dressed, normal affect, normal respiratory effort.  On examination of the left foot she has resolution of the cellulitis she has some mild erythema and peeling skin to the second toe there is an ulcer to the second toe in the webspace this is 5 mm in diameter with proud granulation tissue this does not probe there is no drainage no surrounding erythema.  No odor.  The great toe has no open ulcer.  She does have thickened and onychomycotic nails to the left foot x2 these were trimmed today without incident.   Imaging: No results found. No images are attached to the encounter.  Labs: Lab Results  Component Value Date   HGBA1C 6.5 06/25/2019   HGBA1C 6.6 (H) 05/20/2019   HGBA1C 6.3 (H) 11/12/2018   REPTSTATUS 08/15/2017 FINAL 08/10/2017   GRAMSTAIN NO WBC SEEN NO ORGANISMS SEEN  08/10/2017   CULT  08/10/2017   No growth aerobically or anaerobically. Performed at [facility], [place] [address], [place], [place] [address]    Doy Hutching  03/11/2016    Three or more organisms present,each greater than 10,000 CFU/mL.These organisms,commonly found on external and internal genitalia,are considered to be colonizers.No further testing performed.      Lab Results  Component Value Date   ALBUMIN 3.5 06/25/2019   ALBUMIN 4.0 05/20/2019   ALBUMIN 3.6 11/12/2018    No results found for: MG No results found for: VD25OH  No results found for: PREALBUMIN CBC EXTENDED Latest Ref Rng & Units 01/29/2019 11/18/2018 11/17/2018  WBC 4.0 - 10.5 K/uL 10.6(H) 14.0(H) 10.2  RBC 3.87 - 5.11 MIL/uL 4.55 3.34(L) 3.45(L)  HGB 12.0 - 15.0 g/dL 12.2 9.6(L) 9.9(L)  HCT 36.0 - 46.0 % 39.6 28.8(L) 30.0(L)  PLT 150 - 400 K/uL 244 164 165  NEUTROABS 1.7 - 7.7 K/uL - - -  LYMPHSABS 0.7 - 4.0 K/uL - - -     Body mass index is 26.43 kg/m.  Orders:  No orders of the defined types were placed in this encounter.  No orders of the defined types were placed in this encounter.    Procedures: No procedures performed  Clinical Data: No additional findings.  ROS:  All other systems negative, except as noted in the HPI. Review of Systems  Constitutional: Negative for chills and fever.  Skin: Positive for wound. Negative for color change.    Objective: Vital Signs: Ht 5\' 9"  (1.753 m)   Wt 179 lb (81.2 kg)   BMI 26.43 kg/m   Specialty Comments:  No specialty comments available.  PMFS History: Patient Active Problem List   Diagnosis Date Noted  . 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 Vaginal prolapse 09/30/2018  . Primary osteoarthritis of left hip 06/10/2018  . Osteomyelitis of second toe of right foot ([place]) 04/24/2018  . Osteomyelitis of right foot ([place]) 04/21/2018  . 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 ([place]) 04/17/2018  . Callus 01/13/2018  . Generalized anxiety disorder  10/16/2017  . Menopause 10/16/2017  . DM (diabetes mellitus) type II uncontrolled, periph vascular disorder ([place]) 10/16/2017  . Hyperlipidemia associated with type 2 diabetes mellitus ([place]) 10/16/2017  . 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 Osteomyelitis of great toe of right foot ([place]) 09/25/2017  . Pain in right foot 09/16/2017  . Cellulitis 08/08/2017  . Hypoglycemia without diagnosis of diabetes mellitus 08/08/2017  . Peripheral neuropathy 08/08/2017  . Abscess 08/08/2017  . Hyperlipidemia LDL goal <70 09/22/2016  . Tooth pain 09/03/2012  . Edema 09/03/2012  . Supraclavicular fossa fullness 06/18/2012  . Hair loss 09/18/2010  . Fatigue 09/18/2010  . DYSPEPSIA&OTHER [facility] DISORDERS FUNCTION STOMACH 05/08/2010  . DIARRHEA 05/08/2010  . DIZZINESS 04/19/2010  . OTHER ACUTE SINUSITIS 02/28/2010  . NAUSEA 02/28/2010  . Pain in right ankle and joints of right foot 02/15/2010  . URI 07/09/2007  . GERD 02/12/2007  . LOW BACK PAIN 02/12/2007  . Hypertension 09/03/2006  . HOT FLASHES 09/03/2006  . INSOMNIA 09/03/2006   Past Medical History:  Diagnosis Date  . Anemia    as a younger woman  . Arthritis    "all over my body" (10/01/2017)  . Chronic pain    In pain clinic   . Constipation   . Depression   . Diabetic peripheral neuropathy ([place])   . GERD (gastroesophageal reflux disease)   . High cholesterol   . History of hiatal hernia   . Hypertension   . Type II diabetes mellitus ([place]) dx'd ~ 2008    Family History  Problem Relation Age of Onset  . Cancer Mother        breast  . Depression Mother        bipolar  . Breast cancer Mother 38  . Heart disease Father 68       MI  . Diabetes Brother   . Kidney disease Brother   . Hypertension Brother   . Hyperlipidemia Brother   . Stroke Brother   . Coronary artery disease Other   . Diabetes Other     Past Surgical History:  Procedure Laterality Date  . AMPUTATION Right  09/29/2017   Procedure: AMPUTATION RIGHT 1ST RAY;  Surgeon: Leandrew Koyanagi, MD;  Location: [facility];  Service: Orthopedics;  Laterality: Right;  . AMPUTATION Right 01/29/2019   Procedure: RIGHT TRANSMETATARSAL AMPUTATION;  Surgeon: Newt Minion, MD;  Location: [place];  Service: Orthopedics;  Laterality: Right;  . AMPUTATION TOE Right 04/24/2018   Procedure: RIGHT 2ND TOE PROXIMAL INTERPHALANGEAL JOINT DISARTICULATION;  Surgeon: Leandrew Koyanagi, MD;  Location: [facility];  Service: Orthopedics;  Laterality: Right;  .  APPLICATION OF WOUND VAC Right 09/29/2017   "foot"  . COLONOSCOPY    . I & D EXTREMITY Right 08/10/2017   Procedure: IRRIGATION AND DEBRIDEMENT FOOT;  Surgeon: Leandrew Koyanagi, MD;  Location: [facility];  Service: Orthopedics;  Laterality: Right;  . [place]  . POSTERIOR LUMBAR FUSION  1996  . TOE SURGERY Right 1980s   bone removed "inbetween my toes"  . TOTAL HIP ARTHROPLASTY Left 11/16/2018   Procedure: LEFT TOTAL HIP ARTHROPLASTY ANTERIOR APPROACH;  Surgeon: Leandrew Koyanagi, MD;  Location: [facility];  Service: Orthopedics;  Laterality: Left;  [name] VAGINAL HYSTERECTOMY     Social History   Occupational History  . Not on file  Tobacco Use  . Smoking status: Never Smoker  . Smokeless tobacco: Never Used  Substance and Sexual Activity  . Alcohol use: Never  . Drug use: Never  . Sexual activity: Not Currently    Partners: Male

## 2019-07-16 ENCOUNTER — Other Ambulatory Visit: Payer: Self-pay | Admitting: Family Medicine

## 2019-07-16 DIAGNOSIS — Z1231 Encounter for screening mammogram for malignant neoplasm of breast: Secondary | ICD-10-CM

## 2019-07-21 ENCOUNTER — Telehealth: Payer: Self-pay | Admitting: Family Medicine

## 2019-07-21 NOTE — Progress Notes (Signed)
Attempted to reschedule CCM appt, left message.      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

## 2019-07-27 ENCOUNTER — Other Ambulatory Visit: Payer: Self-pay

## 2019-07-27 ENCOUNTER — Encounter: Payer: Self-pay | Admitting: Physician Assistant

## 2019-07-27 ENCOUNTER — Ambulatory Visit (INDEPENDENT_AMBULATORY_CARE_PROVIDER_SITE_OTHER): Payer: Medicare HMO | Admitting: Family

## 2019-07-27 DIAGNOSIS — L97521 Non-pressure chronic ulcer of other part of left foot limited to breakdown of skin: Secondary | ICD-10-CM | POA: Diagnosis not present

## 2019-07-27 DIAGNOSIS — L03116 Cellulitis of left lower limb: Secondary | ICD-10-CM | POA: Diagnosis not present

## 2019-07-27 NOTE — Progress Notes (Signed)
Office Visit Note   Patient: Brittany [name]           Date of Birth: [birth year]           MRN: [id number] Visit Date: 07/27/2019              Requested by: [address], [place], [place] [address] [place],  [place] [address] PCP: Carollee Herter, Alferd Apa, DO  Chief Complaint  Patient presents with  . Left Foot - Wound Check      HPI: Patient is a 74 year old woman who presents today in follow up for cellulitis and ulceration second toe left foot.  She has been in a postop shoe is rubbing her foot she has been using a toe spacer.  And antibacterial ointment dressing changes    Assessment & Plan: Visit Diagnoses:  1. Ulcer of toe of left foot, limited to breakdown of skin ([place])   2. Cellulitis of left foot     Plan: she can advance her weight bearing in regular shoe wear. she will continue with daily dose of cleansing and mupirocin dressing changes of the second toe continue to closely monitor the toe . Pleased with resolution of ulcer. Follow up in office as needed.  Follow-Up Instructions: Return if symptoms worsen or fail to improve.   Ortho Exam  Patient is alert, oriented, no adenopathy, well-dressed, normal affect, normal respiratory effort.  On examination of the left foot she has resolution of the cellulitis there is no erythema, mild peeling skin to the second toe. there is an ulcer to the second toe in the webspace this is 1 mm in diameter, covered with eschar. No drainage. no surrounding erythema.  No odor.  The great toe has no open ulcer.   Imaging: No results found. No images are attached to the encounter.  Labs: Lab Results  Component Value Date   HGBA1C 6.5 06/25/2019   HGBA1C 6.6 (H) 05/20/2019   HGBA1C 6.3 (H) 11/12/2018   REPTSTATUS 08/15/2017 FINAL 08/10/2017   GRAMSTAIN NO WBC SEEN NO ORGANISMS SEEN  08/10/2017   CULT  08/10/2017    No growth aerobically or anaerobically. Performed at [facility], [place] [address]  [place], [place] [address]    Doy Hutching  03/11/2016    Three or more organisms present,each greater than 10,000 CFU/mL.These organisms,commonly found on external and internal genitalia,are considered to be colonizers.No further testing performed.      Lab Results  Component Value Date   ALBUMIN 3.5 06/25/2019   ALBUMIN 4.0 05/20/2019   ALBUMIN 3.6 11/12/2018    No results found for: MG No results found for: VD25OH  No results found for: PREALBUMIN CBC EXTENDED Latest Ref Rng & Units 01/29/2019 11/18/2018 11/17/2018  WBC 4.0 - 10.5 K/uL 10.6(H) 14.0(H) 10.2  RBC 3.87 - 5.11 MIL/uL 4.55 3.34(L) 3.45(L)  HGB 12.0 - 15.0 g/dL 12.2 9.6(L) 9.9(L)  HCT 36.0 - 46.0 % 39.6 28.8(L) 30.0(L)  PLT 150 - 400 K/uL 244 164 165  NEUTROABS 1.7 - 7.7 K/uL - - -  LYMPHSABS 0.7 - 4.0 K/uL - - -     There is no height or weight on file to calculate BMI.  Orders:  No orders of the defined types were placed in this encounter.  No orders of the defined types were placed in this encounter.    Procedures: No procedures performed  Clinical Data: No additional findings.  ROS:  All other systems  negative, except as noted in the HPI. Review of Systems  Constitutional: Negative for chills and fever.  Skin: Positive for wound. Negative for color change.    Objective: Vital Signs: There were no vitals taken for this visit.  Specialty Comments:  No specialty comments available.  PMFS History: Patient Active Problem List   Diagnosis Date Noted  . Ulcer of toe of left foot, limited to breakdown of skin ([place]) 07/13/2019  . 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 Vaginal prolapse 09/30/2018  . Primary osteoarthritis of left hip 06/10/2018  . Osteomyelitis of second toe of right foot ([place]) 04/24/2018  . Osteomyelitis of right foot ([place]) 04/21/2018  . 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 ([place]) 04/17/2018  . Callus 01/13/2018  . Generalized anxiety disorder 10/16/2017  .  Menopause 10/16/2017  . DM (diabetes mellitus) type II uncontrolled, periph vascular disorder ([place]) 10/16/2017  . Hyperlipidemia associated with type 2 diabetes mellitus ([place]) 10/16/2017  . 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 Osteomyelitis of great toe of right foot ([place]) 09/25/2017  . Pain in right foot 09/16/2017  . Cellulitis 08/08/2017  . Hypoglycemia without diagnosis of diabetes mellitus 08/08/2017  . Peripheral neuropathy 08/08/2017  . Abscess 08/08/2017  . Hyperlipidemia LDL goal <70 09/22/2016  . Tooth pain 09/03/2012  . Edema 09/03/2012  . Supraclavicular fossa fullness 06/18/2012  . Hair loss 09/18/2010  . Fatigue 09/18/2010  . DYSPEPSIA&OTHER [facility] DISORDERS FUNCTION STOMACH 05/08/2010  . DIARRHEA 05/08/2010  . DIZZINESS 04/19/2010  . OTHER ACUTE SINUSITIS 02/28/2010  . NAUSEA 02/28/2010  . Pain in right ankle and joints of right foot 02/15/2010  . URI 07/09/2007  . GERD 02/12/2007  . LOW BACK PAIN 02/12/2007  . Hypertension 09/03/2006  . HOT FLASHES 09/03/2006  . INSOMNIA 09/03/2006   Past Medical History:  Diagnosis Date  . Anemia    as a younger woman  . Arthritis    "all over my body" (10/01/2017)  . Chronic pain    In pain clinic   . Constipation   . Depression   . Diabetic peripheral neuropathy ([place])   . GERD (gastroesophageal reflux disease)   . High cholesterol   . History of hiatal hernia   . Hypertension   . Type II diabetes mellitus ([place]) dx'd ~ 2008    Family History  Problem Relation Age of Onset  . Cancer Mother        breast  . Depression Mother        bipolar  . Breast cancer Mother 87  . Heart disease Father 35       MI  . Diabetes Brother   . Kidney disease Brother   . Hypertension Brother   . Hyperlipidemia Brother   . Stroke Brother   . Coronary artery disease Other   . Diabetes Other     Past Surgical History:  Procedure Laterality Date  . AMPUTATION Right 09/29/2017    Procedure: AMPUTATION RIGHT 1ST RAY;  Surgeon: Leandrew Koyanagi, MD;  Location: [place] [facility];  Service: Orthopedics;  Laterality: Right;  . AMPUTATION Right 01/29/2019   Procedure: RIGHT TRANSMETATARSAL AMPUTATION;  Surgeon: Newt Minion, MD;  Location: [facility];  Service: Orthopedics;  Laterality: Right;  . AMPUTATION TOE Right 04/24/2018   Procedure: RIGHT 2ND TOE PROXIMAL INTERPHALANGEAL JOINT DISARTICULATION;  Surgeon: Leandrew Koyanagi, MD;  Location: [facility];  Service: Orthopedics;  Laterality: Right;  .  APPLICATION OF WOUND VAC Right 09/29/2017   "foot"  . COLONOSCOPY    . I & D EXTREMITY Right 08/10/2017   Procedure: IRRIGATION AND DEBRIDEMENT FOOT;  Surgeon: Leandrew Koyanagi, MD;  Location: [facility];  Service: Orthopedics;  Laterality: Right;  . [place]  . POSTERIOR LUMBAR FUSION  1996  . TOE SURGERY Right 1980s   bone removed "inbetween my toes"  . TOTAL HIP ARTHROPLASTY Left 11/16/2018   Procedure: LEFT TOTAL HIP ARTHROPLASTY ANTERIOR APPROACH;  Surgeon: Leandrew Koyanagi, MD;  Location: [facility];  Service: Orthopedics;  Laterality: Left;  [name] VAGINAL HYSTERECTOMY     Social History   Occupational History  . Not on file  Tobacco Use  . Smoking status: Never Smoker  . Smokeless tobacco: Never Used  Substance and Sexual Activity  . Alcohol use: Never  . Drug use: Never  . Sexual activity: Not Currently    Partners: Male

## 2019-08-07 ENCOUNTER — Other Ambulatory Visit: Payer: Self-pay | Admitting: Family Medicine

## 2019-08-07 DIAGNOSIS — I10 Essential (primary) hypertension: Secondary | ICD-10-CM

## 2019-08-19 DIAGNOSIS — G4700 Insomnia, unspecified: Secondary | ICD-10-CM | POA: Diagnosis not present

## 2019-08-19 DIAGNOSIS — E1142 Type 2 diabetes mellitus with diabetic polyneuropathy: Secondary | ICD-10-CM | POA: Diagnosis not present

## 2019-08-19 DIAGNOSIS — G894 Chronic pain syndrome: Secondary | ICD-10-CM | POA: Diagnosis not present

## 2019-08-19 DIAGNOSIS — Z79891 Long term (current) use of opiate analgesic: Secondary | ICD-10-CM | POA: Diagnosis not present

## 2019-08-23 ENCOUNTER — Other Ambulatory Visit: Payer: Medicare HMO

## 2019-08-23 ENCOUNTER — Telehealth: Payer: Medicare HMO

## 2019-08-23 NOTE — Chronic Care Management (AMB) (Deleted)
Chronic Care Management Pharmacy  Name: Brittany [name]  MRN: [id number] DOB: 1945/08/13   Chief Complaint/ HPI  Brittany [name],  74 y.o. , female presents for their Initial CCM visit with the clinical pharmacist via telephone due to COVID-19 Pandemic.  PCP : Ann Held, DO  Their chronic conditions include: {CHL AMB CHRONIC MEDICAL CONDITIONS:[phone]}  Office Visits:***  Consult Visit:***  Medications: Outpatient Encounter Medications as of 08/23/2019  Medication Sig  . amLODipine (NORVASC) 5 MG tablet Take 1 tablet (5 mg total) by mouth daily.  [name] aspirin EC 81 MG tablet Take 1 tablet (81 mg total) by mouth 2 (two) times daily. (Patient taking differently: Take 81 mg by mouth at bedtime. )  . atorvastatin (LIPITOR) 10 MG tablet Take 1 tablet (10 mg total) by mouth daily.  . bisoprolol-hydrochlorothiazide (ZIAC) 10-6.25 MG tablet TAKE 1 TABLET DAILY  . Blood Glucose Monitoring Suppl (ONE TOUCH ULTRA MINI) w/Device KIT Use as directed once a [name].  Dx Code:  E11.42  . calcium carbonate (TUMS - DOSED IN MG ELEMENTAL CALCIUM) 500 MG chewable tablet Chew 1 tablet by mouth as needed for indigestion or heartburn.  . docusate sodium (COLACE) 100 MG capsule Take 100 mg by mouth 2 (two) times daily.  [name] escitalopram (LEXAPRO) 10 MG tablet Take 1 tablet (10 mg total) by mouth daily.  [name] estradiol (ESTRACE) 1 MG tablet Take 1 tablet (1 mg total) by mouth daily.  . ferrous sulfate 325 (65 FE) MG tablet Take 325 mg by mouth daily with breakfast.  . folic acid (FOLVITE) 578 MCG tablet Take 800 mcg by mouth daily.   . furosemide (LASIX) 20 MG tablet Take 1 tablet (20 mg total) by mouth daily.  [name] gabapentin (NEURONTIN) 800 MG tablet Take 1 tablet (800 mg total) by mouth 4 (four) times daily.  [name] glucose blood (ONE TOUCH ULTRA TEST) test strip Check Blood sugar once daily  . KLOR-CON M20 20 MEQ tablet 1 po qd  . methocarbamol (ROBAXIN) 750 MG tablet Take 1 tablet (750 mg total) by mouth  2 (two) times daily as needed for muscle spasms.  [name] morphine (MS CONTIN) 60 MG 12 hr tablet Take 60 mg by mouth every 12 (twelve) hours.   . Multiple Vitamins-Minerals (MULTIVITAMIN GUMMIES WOMENS PO) Take 1 tablet by mouth daily.   . mupirocin ointment (BACTROBAN) 2 % Apply 1 application topically daily. Apply to the affected area 2 times a [name]  . omeprazole (PRILOSEC) 40 MG capsule TAKE 1 CAPSULE DAILY.      PLEASE MAKE AN APPOINTMENT WITH YOUR DOCTOR  . ONETOUCH DELICA LANCETS 46N MISC Check blood sugar once daily  . sitaGLIPtin-metformin (JANUMET) 50-1000 MG tablet TAKE 2 TABLETS AT BEDTIME. PLEASE MAKE AN APPOINTMENT WITH YOUR DOCTOR  . Specialty Vitamins Products (BIOTIN PLUS KERATIN) 10000-100 MCG-MG TABS Take 1 tablet by mouth daily.  [name] tiZANidine (ZANAFLEX) 4 MG tablet Take 4 mg by mouth 3 (three) times daily.    No facility-administered encounter medications on file as of 08/23/2019.     Current Diagnosis/Assessment:  Goals Addressed   None     Diabetes   Recent Relevant Labs: Lab Results  Component Value Date/Time   HGBA1C 6.5 06/25/2019 11:21 AM   HGBA1C 6.6 (H) 05/20/2019 10:42 AM   MICROALBUR <0.7 06/25/2019 11:21 AM   MICROALBUR 0.9 05/20/2019 10:42 AM     Checking BG: {CHL HP Blood Glucose Monitoring Frequency:[id number]}  Recent FBG Readings: Recent pre-meal BG readings: ***  Recent 2hr PP BG readings:  *** Recent HS BG readings: *** Patient has failed these meds in past: *** Patient is currently {CHL Controlled/Uncontrolled:[phone]} on the following medications: ***  Last diabetic Foot exam: No results found for: HMDIABEYEEXA  Last diabetic Eye exam: No results found for: HMDIABFOOTEX   We discussed: {CHL HP [facility] Pharmacy discussion:[phone]}  Plan  Continue {CHL HP [facility] Pharmacy Plans:[phone]}   Hypertension   BP today is:  {CHL HP UPSTREAM Pharmacist BP [phone]}  Office blood pressures are  BP Readings from Last 3  Encounters:  06/25/19 115/64  03/18/19 140/60  01/29/19 (!) 124/46    Patient has failed these meds in the past: ***  Patient checks BP at home {CHL HP BP Monitoring [phone]}  Patient home BP readings are ranging: ***  We discussed {CHL HP [facility] Pharmacy discussion:[phone]}  Plan  Continue {CHL HP [facility] Pharmacy Plans:[phone]}   Hyperlipidemia   Lipid Panel     Component Value Date/Time   CHOL 132 06/25/2019 1121   TRIG 60.0 06/25/2019 1121   HDL 68.80 06/25/2019 1121   CHOLHDL 2 06/25/2019 1121   VLDL 12.0 06/25/2019 1121   LDLCALC 51 06/25/2019 1121     The 10-year ASCVD risk score (Goff DC Jr., et al., 2013) is: 18.4%   Values used to calculate the score:     Age: 51 years     Sex: Female     Is Non-Hispanic African American: No     Diabetic: Yes     Tobacco smoker: No     Systolic Blood Pressure: 128 mmHg     Is BP treated: Yes     HDL Cholesterol: 68.8 mg/dL     Total Cholesterol: 132 mg/dL   Patient has failed these meds in past: *** Patient is currently {CHL Controlled/Uncontrolled:[phone]} on the following medications: ***  We discussed:  {CHL HP [facility] Pharmacy discussion:[phone]}  Plan  Continue {CHL HP [facility] Pharmacy Plans:[phone]}  Neuropathy    Patient has failed these meds in past: *** Patient is currently {CHL Controlled/Uncontrolled:[phone]} on the following medications: ***  We discussed:  {CHL HP [facility] Pharmacy discussion:[phone]}  Plan  Continue {CHL HP [facility] Pharmacy Plans:[phone]}   GERD    Patient has failed these meds in past: *** Patient is currently {CHL Controlled/Uncontrolled:[phone]} on the following medications: ***  We discussed:  {CHL HP [facility] Pharmacy discussion:[phone]}  Plan  Continue {CHL HP [facility] Pharmacy NOMVE:[id number]}   Miscellaneous Meds  Meds to D/C from list

## 2019-08-30 ENCOUNTER — Other Ambulatory Visit: Payer: Self-pay

## 2019-08-30 ENCOUNTER — Ambulatory Visit: Payer: Medicare HMO | Admitting: Pharmacist

## 2019-08-30 VITALS — BP 126/78 | HR 62 | Temp 97.9°F | Wt 173.0 lb

## 2019-08-30 DIAGNOSIS — I10 Essential (primary) hypertension: Secondary | ICD-10-CM

## 2019-08-30 DIAGNOSIS — IMO0002 Reserved for concepts with insufficient information to code with codable children: Secondary | ICD-10-CM

## 2019-08-30 DIAGNOSIS — E1169 Type 2 diabetes mellitus with other specified complication: Secondary | ICD-10-CM

## 2019-08-30 DIAGNOSIS — E1151 Type 2 diabetes mellitus with diabetic peripheral angiopathy without gangrene: Secondary | ICD-10-CM

## 2019-08-30 DIAGNOSIS — E785 Hyperlipidemia, unspecified: Secondary | ICD-10-CM

## 2019-08-30 NOTE — Chronic Care Management (AMB) (Signed)
Chronic Care Management Pharmacy  Name: Brittany [name]  MRN: [id number] DOB: April 23, 1945   Chief Complaint/ HPI  Brittany [name],  74 y.o. , female presents for their Initial CCM visit with the clinical pharmacist In office (scheduled for telephone, but pt presented to clinic and was seen in office).  PCP : Ann Held, DO  Their chronic conditions include: Diabetes, Hypertension, Hyperlipidemia, Depression, Neuropathy/Pain, Muscle Spasm, GERD, Estrogen Deficiency  Office Visits: 06/25/19: Visit w/ Dr. Etter Sjogren - Labs ordered (cmp, a1c, lipid, ma/cr). No med changes noted.   03/18/19: Visit w/ Dr. Etter Sjogren - Labs ordered (cmp, a1c, lipid, ma/cr). No med changes noted.   Consult Visit: 07/27/19: [facility] visit w/ Dondra Prader, NP - Wound check s/p cellulitis and ulceration of second toe on L foot. Pt can advance her weight bearing in regular shoe wear. Daily cleansing plus mupirocin. RTC prn.   07/13/19: Ortho visit w/ Bevely Palmer Persons, PA - Follow up for cellulitis and ulceration. Pt given toe spacers due to issues with toe rubbing. Continue cleansing and mupirocin.   06/28/19: Ortho visit w/ Bevely Palmer Persons, PA - Follow up post tx w/ doxycyline and mupirocin for acute blistering and cellulitis. Continue to limit weightbearing and do daily dressings. Foot still has erythema, but is less swollen. Follow up in 1 week.   06/10/19: Ortho visit w/ Dr. 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 Presents with acute blistering cellulitis of L foot great toe and second toe with cellulitis extending to midfoot. Popped blister with pen w/ bloody drainage. No clinical signs of osteomyelitis. Start on doxycycline and mupirocin. Pt given postoperative shoe with recommendation for minimal weight bearing.   Medications: Outpatient Encounter Medications as of 08/30/2019  Medication Sig  . amLODipine (NORVASC) 5 MG tablet Take 1 tablet (5 mg total) by mouth daily.  [name] aspirin EC 81 MG  tablet Take 1 tablet (81 mg total) by mouth 2 (two) times daily. (Patient taking differently: Take 81 mg by mouth at bedtime. )  . atorvastatin (LIPITOR) 10 MG tablet Take 1 tablet (10 mg total) by mouth daily.  . bisoprolol-hydrochlorothiazide (ZIAC) 10-6.25 MG tablet TAKE 1 TABLET DAILY  . calcium carbonate (TUMS - DOSED IN MG ELEMENTAL CALCIUM) 500 MG chewable tablet Chew 1 tablet by mouth as needed for indigestion or heartburn.  . docusate sodium (COLACE) 100 MG capsule Take 100 mg by mouth 2 (two) times daily.  [name] escitalopram (LEXAPRO) 10 MG tablet Take 1 tablet (10 mg total) by mouth daily.  [name] estradiol (ESTRACE) 1 MG tablet Take 1 tablet (1 mg total) by mouth daily.  . ferrous sulfate 325 (65 FE) MG tablet Take 325 mg by mouth daily with breakfast.  . folic acid (FOLVITE) 329 MCG tablet Take 800 mcg by mouth daily.   . furosemide (LASIX) 20 MG tablet Take 1 tablet (20 mg total) by mouth daily.  [name] gabapentin (NEURONTIN) 800 MG tablet Take 1 tablet (800 mg total) by mouth 4 (four) times daily.  [name] KLOR-CON M20 20 MEQ tablet 1 po qd  . morphine (MS CONTIN) 30 MG 12 hr tablet Take 30 mg by mouth every 12 (twelve) hours. Per Dr. Nicholaus Bloom with Pain Mgmt  . Multiple Vitamins-Minerals (MULTIVITAMIN GUMMIES WOMENS PO) Take 1 tablet by mouth daily.   [name] omeprazole (PRILOSEC) 40 MG capsule TAKE 1 CAPSULE DAILY.      PLEASE MAKE AN APPOINTMENT WITH YOUR DOCTOR  . sitaGLIPtin-metformin (JANUMET) 50-1000 MG  tablet TAKE 2 TABLETS AT BEDTIME. PLEASE MAKE AN APPOINTMENT WITH YOUR DOCTOR  . Specialty Vitamins Products (BIOTIN PLUS KERATIN) 10000-100 MCG-MG TABS Take 1 tablet by mouth daily.  [name] tiZANidine (ZANAFLEX) 4 MG tablet Take 4 mg by mouth 3 (three) times daily.   . Blood Glucose Monitoring Suppl (ONE TOUCH ULTRA MINI) w/Device KIT Use as directed once a [place]uneta Rueger.  Dx Code:  E11.42  . glucose blood (ONE TOUCH ULTRA TEST) test strip Check Blood sugar once daily  . methocarbamol (ROBAXIN) 750 MG tablet  Take 1 tablet (750 mg total) by mouth 2 (two) times daily as needed for muscle spasms. (Patient not taking: Reported on 08/30/2019)  . mupirocin ointment (BACTROBAN) 2 % Apply 1 application topically daily. Apply to the affected area 2 times a Derl Abalos (Patient not taking: Reported on 08/30/2019)  . ONETOUCH DELICA LANCETS 76L MISC Check blood sugar once daily   No facility-administered encounter medications on file as of 08/30/2019.   SDOH Screenings   Alcohol Screen:   . Last Alcohol Screening Score (AUDIT):   Depression (PHQ2-9): Medium Risk  . PHQ-2 Score: 9  Financial Resource Strain: Low Risk   . Difficulty of Paying Living Expenses: Not very hard  Food Insecurity:   . Worried About Charity fundraiser in the Last Year:   . [place] in the Last Year:   Housing:   . Last Housing Risk Score:   Physical Activity:   . Days of Exercise per Week:   . Minutes of Exercise per Session:   Social Connections:   . Frequency of Communication with Friends and Family:   . Frequency of Social Gatherings with Friends and Family:   . Attends Religious Services:   . Active Member of Clubs or Organizations:   . Attends Archivist Meetings:   [name] Marital Status:   Stress:   . Feeling of Stress :   Tobacco Use: Low Risk   . Smoking Tobacco Use: Never Smoker  . Smokeless Tobacco Use: Never Used  Transportation Needs:   . Film/video editor (Medical):   [name] Lack of Transportation (Non-Medical):      Current Diagnosis/Assessment:  Goals Addressed            This Visit's Progress   . Chronic Care Managment Pharmacy Care Plan       CARE PLAN ENTRY  Current Barriers:  . Chronic Disease Management support, education, and care coordination needs related to Diabetes, Hypertension, Hyperlipidemia, Depression, Neuropathy/Pain, Muscle Spasm, GERD, Estrogen Deficiency   Hypertension . Pharmacist Clinical Goal(s): o Over the next 90 days, patient will work with PharmD and providers  to maintain BP goal <140/90 . Current regimen:  o Amlodipine 68m daily, bisoprolol-hctz 10-6.25mg daily, furosemide 290mdaily . Intervention:  o Consider reducing furosemide to every other Dorianne Perret pending Dr. LoEtter Sjogrenpproval . Patient self care activities - Over the next 90 days, patient will: o Maintain hypertension medication regimen.   Hyperlipidemia . Pharmacist Clinical Goal(s): o Over the next 90 days, patient will work with PharmD and providers to maintain LDL goal <100 . Current regimen:  o Atorvastatin 1045maily . Patient self care activities - Over the next 90 days, patient will: o Maintain cholesterol medication regimen.   Diabetes . Pharmacist Clinical Goal(s): o Over the next 90 days, patient will work with PharmD and providers to maintain A1c goal <7% . Current regimen:  o Janumet 50-1000m58mtabs daily at bedtime . Patient self  care activities - Over the next 90 days, patient will: o .Maintain diabetes medication regimen  GERD . Pharmacist Clinical Goal(s) o Over the next 90 days, patient will work with PharmD and providers to reduce symptoms of GERD . Current regimen:  o Omeprazole 26m daily . Interventions: o Discussed how hernias could affect GERD Symptoms o Consult with Dr. LEtter Sjogrento determine if further assessment is needed regarding patient's concern for hernia . Patient self care activities - Over the next 90 days, patient will: o Follow up with Dr. LEtter Sjogrenas appropriate for hernia concerns   Estrogen Deficiency . Pharmacist Clinical Goal(s) o Over the next 90 days, patient will work with PharmD and providers to reduce symptoms of estrogen deficiency and polypharmacy . Current regimen:  o Estradiol 136mdaily . Interventions: o Collaboration with provider regarding medication management (estradiol continuation vs discontinuation) . Patient self care activities - Over the next 90 days, patient will: o Inform PCP or Pharmacist if hot flashes  recur  Medication management . Pharmacist Clinical Goal(s): o Over the next 90 days, patient will work with PharmD and providers to maintain optimal medication adherence . Current pharmacy: [facility]  Interventions o Comprehensive medication review performed. o Continue current medication management strategy . Patient self care activities - Over the next 90 days, patient will: o Focus on medication adherence by filling medications appropriately  o Take medications as prescribed o Report any questions or concerns to PharmD and/or provider(s)  Initial goal documentation       Social Hx: Sofie = dog chiuaua and weenie dog  Widowed x 8 years. Has a son. 3 grand daughters. Oldest is 1725ears old and has a birthday today.   Diabetes   Recent Relevant Labs: Lab Results  Component Value Date/Time   HGBA1C 6.5 06/25/2019 11:21 AM   HGBA1C 6.6 (H) 05/20/2019 10:42 AM   MICROALBUR <0.7 06/25/2019 11:21 AM   MICROALBUR 0.9 05/20/2019 10:42 AM     Checking BG: Weekly  Recent FBG Readings: Unable to report Patient has failed these meds in past: None noted  Patient is currently controlled on the following medications: Janumet 50-100049m tabs daily at bedtime  Last diabetic Eye exam: No results found for: HMDIABEYEEXA  Last diabetic Foot exam: No results found for: HMDIABFOOTEX   Plan -Continue current medications   Hypertension   CMP Latest Ref Rng & Units 06/25/2019 05/20/2019 01/29/2019  Glucose 70 - 99 mg/dL 101(H) 96 111(H)  BUN 6 - 23 mg/dL 15 15 19   Creatinine 0.40 - 1.20 mg/dL 0.69 0.77 0.75  Sodium 135 - 145 mEq/L 141 140 139  Potassium 3.5 - 5.1 mEq/L 4.3 4.0 3.5  Chloride 96 - 112 mEq/L 102 102 101  CO2 19 - 32 mEq/L 33(H) 33(H) 26  Calcium 8.4 - 10.5 mg/dL 9.0 9.2 9.1  Total Protein 6.0 - 8.3 g/dL 6.5 6.9 -  Total Bilirubin 0.2 - 1.2 mg/dL 0.3 0.4 -  Alkaline Phos 39 - 117 U/L 54 56 -  AST 0 - 37 U/L 15 17 -  ALT 0 - 35 U/L 16 15 -   Kidney  Function Lab Results  Component Value Date/Time   CREATININE 0.69 06/25/2019 11:21 AM   CREATININE 0.77 05/20/2019 10:42 AM   GFR 83.81 06/25/2019 11:21 AM   GFRNONAA >60 01/29/2019 09:48 AM   GFRAA >60 01/29/2019 09:48 AM    BP today is:  <140/90 as shown below  Office blood pressures are  BP Readings  from Last 3 Encounters:  08/30/19 126/78  06/25/19 115/64  03/18/19 140/60   Blood pressure goal <140/90  Patient has failed these meds in the past: None noted  Patient is currently controlled on the following medications: Amlodipine 90m daily, bisoprolol-hctz 10-6.25mg daily, furosemide 275mdaily  Patient states she does not like having to urinate so frequently while taking furosemide.  Denies much edema unless she is standing for extended periods of time.  We discussed the possibility of her doing every other Dezarai Prew dosing of furosemide pending Dr. LoEtter Sjogrenpproval.   Patient checks BP at home infrequently  Patient home BP readings are ranging: Could not define  Plan -Consider reducing furosemide to every other Soma Lizak pending Dr. LoEtter Sjogrenpproval    Hyperlipidemia   Lipid Panel     Component Value Date/Time   CHOL 132 06/25/2019 1121   TRIG 60.0 06/25/2019 1121   HDL 68.80 06/25/2019 1121   CHOLHDL 2 06/25/2019 1121   VLDL 12.0 06/25/2019 1121   LDLCALC 51 06/25/2019 1121    LDL goal <100   The 10-year ASCVD risk score (GMikey BussingC Jr., et al., 2013) is: 21.7%   Values used to calculate the score:     Age: [age]ears     Sex: Female     Is Non-Hispanic African American: No     Diabetic: Yes     Tobacco smoker: No     Systolic Blood Pressure: 12604mHg     Is BP treated: Yes     HDL Cholesterol: 68.8 mg/dL     Total Cholesterol: 132 mg/dL   Patient has failed these meds in past: simvastatin (possible drug intxn with amlodipine) Patient is currently controlled on the following medications: Atorvastatin 1047maily  Plan -Continue current medications   Depression     Patient has failed these meds in past: doxepin? Patient is currently controlled on the following medications: Escitalopram 37m84mily  Plan -Continue current medications   Neuropathy/Pain    Patient has failed these meds in past: None noted  Patient is currently controlled on the following medications: Gabapentin 800mg52mr times daily, morphine 12hr 30mg 56me daily  Followed by pain clinic. Sees. Dr. Mark PElta Guadeloupeps every 6 months  Plan -Continue current medications   Muscle Spasm    Patient has failed these meds in past: None noted  Patient is currently controlled on the following medications:  Tizanidine 4mg th69m times daily with meals  Plan -Continue current medications   GERD   Patient has failed these meds in past: None noted  Patient is currently controlled on the following medications: omeprazole 40mg da72m Pt states she has a hernia and wonders if this could be the cause of her stomach feeling enlarged.  She states she has done some heavy lifting that she knows she probably should not have done. She is concerned about causing the hernia to become larger. Will consult with Dr. Lowne  WEtter Sjogrencussed:  the risk/benefit of continuation vs discontinuation of long term PPI use   Plan -Continue current medications  -Consult with Dr. Lowne toEtter Sjogrenrmine if further evaluation needed regarding hernia   Estrogen Deficiency     Patient has failed these meds in past: None noted  Patient is currently controlled on the following medications: Estradiol 1mg dail30mPatient wonders if she still needs to take this medication. Will discuss further with Dr. Lowne.  SEtter Sjogrenates she has taken estradiol for many years. Has not had a hot flash in  years.   We discussed:  Risk/benefit of continuation/discontinuation of estradiol therapy  Plan -Consult with Dr. Etter Sjogren about discontinuation of estradiol. Can consider stopping abruptly or implementing tapering plan.   Memory     Patient has failed these meds in past: None noted  Patient is currently uncontrolled on the following medications: None  Patient is concerned about her memory. We discussed different evaluations that could assess her memory.  Will assess memory at next visit  Plan -Consider completing memory assessment such as MMSE or Mini-cog  Supplementation    We discussed: Discussed my philosophy on supplementation:  1) Is there evidence that it helps with the condition/symptom you are attempting to treat/resolve? (efficacy) 2) Does it cause side effects? (harm) 3) Does it interact with your other medications? (interactions) 4) Are you able to afford this in your budget? (affordability)  If all things are satisfied then the patient can make their choice whether they would like to continue with supplementation noting there may not be a benefit to the supplementation.   Recommended patient to use USP verified supplements when she can to assure she is getting the dosage of vitamins listed on the label  Plan -Consider purchasing USP verified vitamins   CBC Latest Ref Rng & Units 01/29/2019 11/18/2018 11/17/2018  WBC 4.0 - 10.5 K/uL 10.6(H) 14.0(H) 10.2  Hemoglobin 12.0 - 15.0 g/dL 12.2 9.6(L) 9.9(L)  Hematocrit 36.0 - 46.0 % 39.6 28.8(L) 30.0(L)  Platelets 150 - 400 K/uL 244 164 165    Miscellaneous Meds Tums Docusate Ferrous Sulfate (can consider D/C) Folic Acid (can consider D/C) Potassium Chloride 75mq Multivitamin Biotin + Keratin  Meds to D/C from list  Mupirocin Methocarbamol  Up[facility] Offered [facility], but patient states she is happy with her current medication regimen and might consider this when she gets older.

## 2019-09-01 NOTE — Patient Instructions (Addendum)
Visit Information  Goals Addressed            This Visit's Progress   . Chronic Care Managment Pharmacy Care Plan       CARE PLAN ENTRY  Current Barriers:  . Chronic Disease Management support, education, and care coordination needs related to Diabetes, Hypertension, Hyperlipidemia, Depression, Neuropathy/Pain, Muscle Spasm, GERD, Estrogen Deficiency   Hypertension . Pharmacist Clinical Goal(s): o Over the next 90 days, patient will work with PharmD and providers to maintain BP goal <140/90 . Current regimen:  o Amlodipine 71m daily, bisoprolol-hctz 10-6.25mg daily, furosemide 273mdaily . Intervention:  o Consider reducing furosemide to every other day pending Dr. LoEtter Sjogrenpproval . Patient self care activities - Over the next 90 days, patient will: o Maintain hypertension medication regimen.   Hyperlipidemia . Pharmacist Clinical Goal(s): o Over the next 90 days, patient will work with PharmD and providers to maintain LDL goal <100 . Current regimen:  o Atorvastatin 1059maily . Patient self care activities - Over the next 90 days, patient will: o Maintain cholesterol medication regimen.   Diabetes . Pharmacist Clinical Goal(s): o Over the next 90 days, patient will work with PharmD and providers to maintain A1c goal <7% . Current regimen:  o Janumet 50-1000m29mtabs daily at bedtime . Patient self care activities - Over the next 90 days, patient will: o .Maintain diabetes medication regimen  GERD . Pharmacist Clinical Goal(s) o Over the next 90 days, patient will work with PharmD and providers to reduce symptoms of GERD . Current regimen:  o Omeprazole 40mg78mly . Interventions: o Discussed how hernias could affect GERD Symptoms o Consult with Dr. LowneEtter Sjogrenetermine if further assessment is needed regarding patient's concern for hernia . Patient self care activities - Over the next 90 days, patient will: o Follow up with Dr. LowneEtter Sjogrenppropriate for hernia  concerns   Estrogen Deficiency . Pharmacist Clinical Goal(s) o Over the next 90 days, patient will work with PharmD and providers to reduce symptoms of estrogen deficiency and polypharmacy . Current regimen:  o Estradiol 1mg d55my . Interventions: o Collaboration with provider regarding medication management (estradiol continuation vs discontinuation) . Patient self care activities - Over the next 90 days, patient will: o Inform PCP or Pharmacist if hot flashes recur  Medication management . Pharmacist Clinical Goal(s): o Over the next 90 days, patient will work with PharmD and providers to maintain optimal medication adherence . [facility] o Comprehensive medication review performed. o Continue current medication management strategy . Patient self care activities - Over the next 90 days, patient will: o Focus on medication adherence by filling medications appropriately  o Take medications as prescribed o Report any questions or concerns to PharmD and/or provider(s)  Initial goal documentation        Ms. PattonSilveriaiven information about Chronic Care Management services today including:  1. CCM service includes personalized support from designated clinical staff supervised by her physician, including individualized plan of care and coordination with other care providers 2. 24/7 contact phone numbers for assistance for urgent and routine care needs. 3. Standard insurance, coinsurance, copays and deductibles apply for chronic care management only during months in which we provide at least 20 minutes of these services. Most insurances cover these services at 100%, however patients may be responsible for any copay, coinsurance and/or deductible if applicable. This service may help you avoid the need for more expensive face-to-face services.  4. Only one practitioner may furnish and bill the service in a calendar month. 5. The patient  may stop CCM services at any time (effective at the end of the month) by phone call to the office staff.  Patient agreed to services and verbal consent obtained.   The patient verbalized understanding of instructions provided today and agreed to receive a mailed copy of patient instruction and/or educational materials. Telephone follow up appointment with pharmacy team member scheduled for: 11/01/2019  Brittany [name], PharmD Clinical Pharmacist [facility] [phone]      Healthy Eating Following a healthy eating pattern may help you to achieve and maintain a healthy body weight, reduce the risk of chronic disease, and live a long and productive life. It is important to follow a healthy eating pattern at an appropriate calorie level for your body. Your nutritional needs should be met primarily through food by choosing a variety of nutrient-rich foods. What are tips for following this plan? Reading food labels  Read labels and choose the following: ? Reduced or low sodium. ? Juices with 100% fruit juice. ? Foods with low saturated fats and high polyunsaturated and monounsaturated fats. ? Foods with whole grains, such as whole wheat, cracked wheat, brown rice, and wild rice. ? Whole grains that are fortified with folic acid. This is recommended for women who are pregnant or who want to become pregnant.  Read labels and avoid the following: ? Foods with a lot of added sugars. These include foods that contain brown sugar, corn sweetener, corn syrup, dextrose, fructose, glucose, high-fructose corn syrup, honey, invert sugar, lactose, malt syrup, maltose, molasses, raw sugar, sucrose, trehalose, or turbinado sugar.  Do not eat more than the following amounts of added sugar per Brittany [name]:  6 teaspoons (25 g) for women.  9 teaspoons (38 g) for men. ? Foods that contain processed or refined starches and grains. ? Refined grain products, such as white flour,  degermed cornmeal, white bread, and white rice. Shopping  Choose nutrient-rich snacks, such as vegetables, whole fruits, and nuts. Avoid high-calorie and high-sugar snacks, such as potato chips, fruit snacks, and candy.  Use oil-based dressings and spreads on foods instead of solid fats such as butter, stick margarine, or cream cheese.  Limit pre-made sauces, mixes, and "instant" products such as flavored rice, instant noodles, and ready-made pasta.  Try more plant-protein sources, such as tofu, tempeh, black beans, edamame, lentils, nuts, and seeds.  Explore eating plans such as the Mediterranean diet or vegetarian diet. Cooking  Use oil to saut or stir-fry foods instead of solid fats such as butter, stick margarine, or lard.  Try baking, boiling, grilling, or broiling instead of frying.  Remove the fatty part of meats before cooking.  Steam vegetables in water or broth. Meal planning   At meals, imagine dividing your plate into fourths: ? One-half of your plate is fruits and vegetables. ? One-fourth of your plate is whole grains. ? One-fourth of your plate is protein, especially lean meats, poultry, eggs, tofu, beans, or nuts.  Include low-fat dairy as part of your daily diet. Lifestyle  Choose healthy options in all settings, including home, work, school, restaurants, or stores.  Prepare your food safely: ? Wash your hands after handling raw meats. ? Keep food preparation surfaces clean by regularly washing with hot, soapy water. ? Keep raw meats separate from ready-to-eat foods, such as fruits and vegetables. ? Cook seafood, meat, poultry, and eggs to the recommended  internal temperature. ? Store foods at safe temperatures. In general:  Keep cold foods at 19F (4.4C) or below.  Keep hot foods at 119F (60C) or above.  Keep your freezer at [facility] (-17.8C) or below.  Foods are no longer safe to eat when they have been between the temperatures of 40-119F (4.4-60C)  for more than 2 hours. What foods should I eat? Fruits Aim to eat 2 cup-equivalents of fresh, canned (in natural juice), or frozen fruits each Lonnie Reth. Examples of 1 cup-equivalent of fruit include 1 small apple, 8 large strawberries, 1 cup canned fruit,  cup dried fruit, or 1 cup 100% juice. Vegetables Aim to eat 2-3 cup-equivalents of fresh and frozen vegetables each Taavi Hoose, including different varieties and colors. Examples of 1 cup-equivalent of vegetables include 2 medium carrots, 2 cups raw, leafy greens, 1 cup chopped vegetable (raw or cooked), or 1 medium baked potato. Grains Aim to eat 6 ounce-equivalents of whole grains each Luwana Butrick. Examples of 1 ounce-equivalent of grains include 1 slice of bread, 1 cup ready-to-eat cereal, 3 cups popcorn, or  cup cooked rice, pasta, or cereal. Meats and other proteins Aim to eat 5-6 ounce-equivalents of protein each Mayline Dragon. Examples of 1 ounce-equivalent of protein include 1 egg, 1/2 cup nuts or seeds, or 1 tablespoon (16 g) peanut butter. A cut of meat or fish that is the size of a deck of cards is about 3-4 ounce-equivalents.  Of the protein you eat each week, try to have at least 8 ounces come from seafood. This includes salmon, trout, herring, and anchovies. Dairy Aim to eat 3 cup-equivalents of fat-free or low-fat dairy each Mishael Haran. Examples of 1 cup-equivalent of dairy include 1 cup (240 mL) milk, 8 ounces (250 g) yogurt, 1 ounces (44 g) natural cheese, or 1 cup (240 mL) fortified soy milk. Fats and oils  Aim for about 5 teaspoons (21 g) per Faysal Fenoglio. Choose monounsaturated fats, such as canola and olive oils, avocados, peanut butter, and most nuts, or polyunsaturated fats, such as sunflower, corn, and soybean oils, walnuts, pine nuts, sesame seeds, sunflower seeds, and flaxseed. Beverages  Aim for six 8-oz glasses of water per Redith Drach. Limit coffee to three to five 8-oz cups per Mayme Profeta.  Limit caffeinated beverages that have added calories, such as soda and energy  drinks.  Limit alcohol intake to no more than 1 drink a Lurlean Kernen for nonpregnant women and 2 drinks a Regan Mcbryar for men. One drink equals 12 oz of beer (355 mL), 5 oz of wine (148 mL), or 1 oz of hard liquor (44 mL). Seasoning and other foods  Avoid adding excess amounts of salt to your foods. Try flavoring foods with herbs and spices instead of salt.  Avoid adding sugar to foods.  Try using oil-based dressings, sauces, and spreads instead of solid fats. This information is based on general [place] nutrition guidelines. For more information, visit [employer].es. Exact amounts may vary based on your nutrition needs. Summary  A healthy eating plan may help you to maintain a healthy weight, reduce the risk of chronic diseases, and stay active throughout your life.  Plan your meals. Make sure you eat the right portions of a variety of nutrient-rich foods.  Try baking, boiling, grilling, or broiling instead of frying.  Choose healthy options in all settings, including home, work, school, restaurants, or stores. This information is not intended to replace advice given to you by your health care provider. Make sure you discuss any questions you have with your health  care provider. Document Revised: 07/07/2017 Document Reviewed: 07/07/2017 Elsevier Patient Education  [place].

## 2019-09-09 ENCOUNTER — Other Ambulatory Visit: Payer: Self-pay | Admitting: Family Medicine

## 2019-09-09 DIAGNOSIS — F411 Generalized anxiety disorder: Secondary | ICD-10-CM

## 2019-09-10 IMAGING — CR DG FOOT COMPLETE 3+V*R*
3 series · 3 of 3 positions shown · non-contrast
Comparison: None.

CLINICAL DATA: 71 y/o F; diabetic wound on the bottom of the right
foot on plantar surface around first and second metatarsals.

EXAM:
RIGHT FOOT COMPLETE - 3+ VIEW

[t foot ap right]
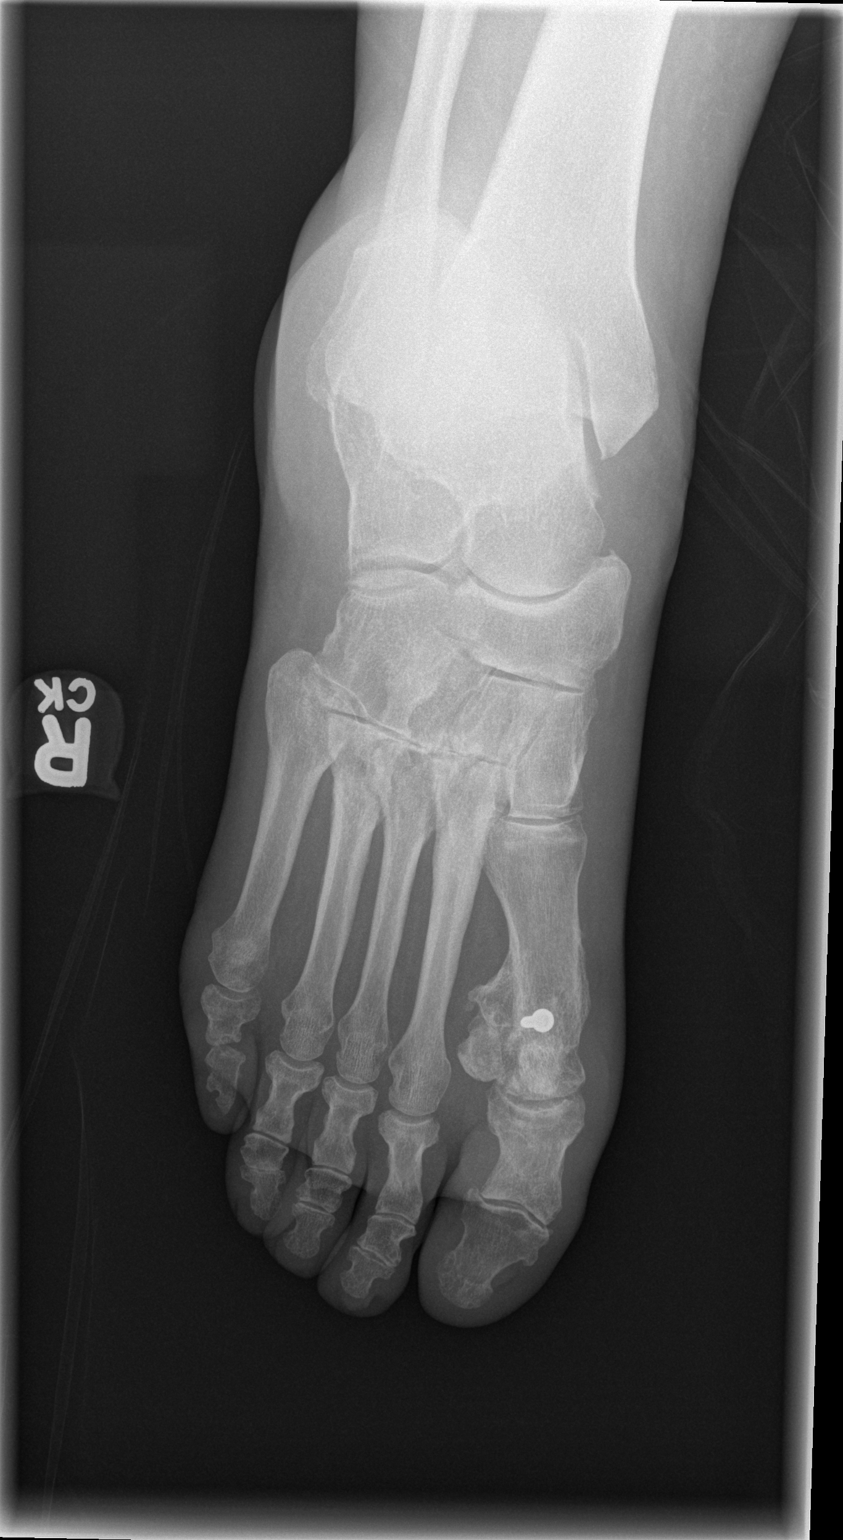

[t foot oblique right]
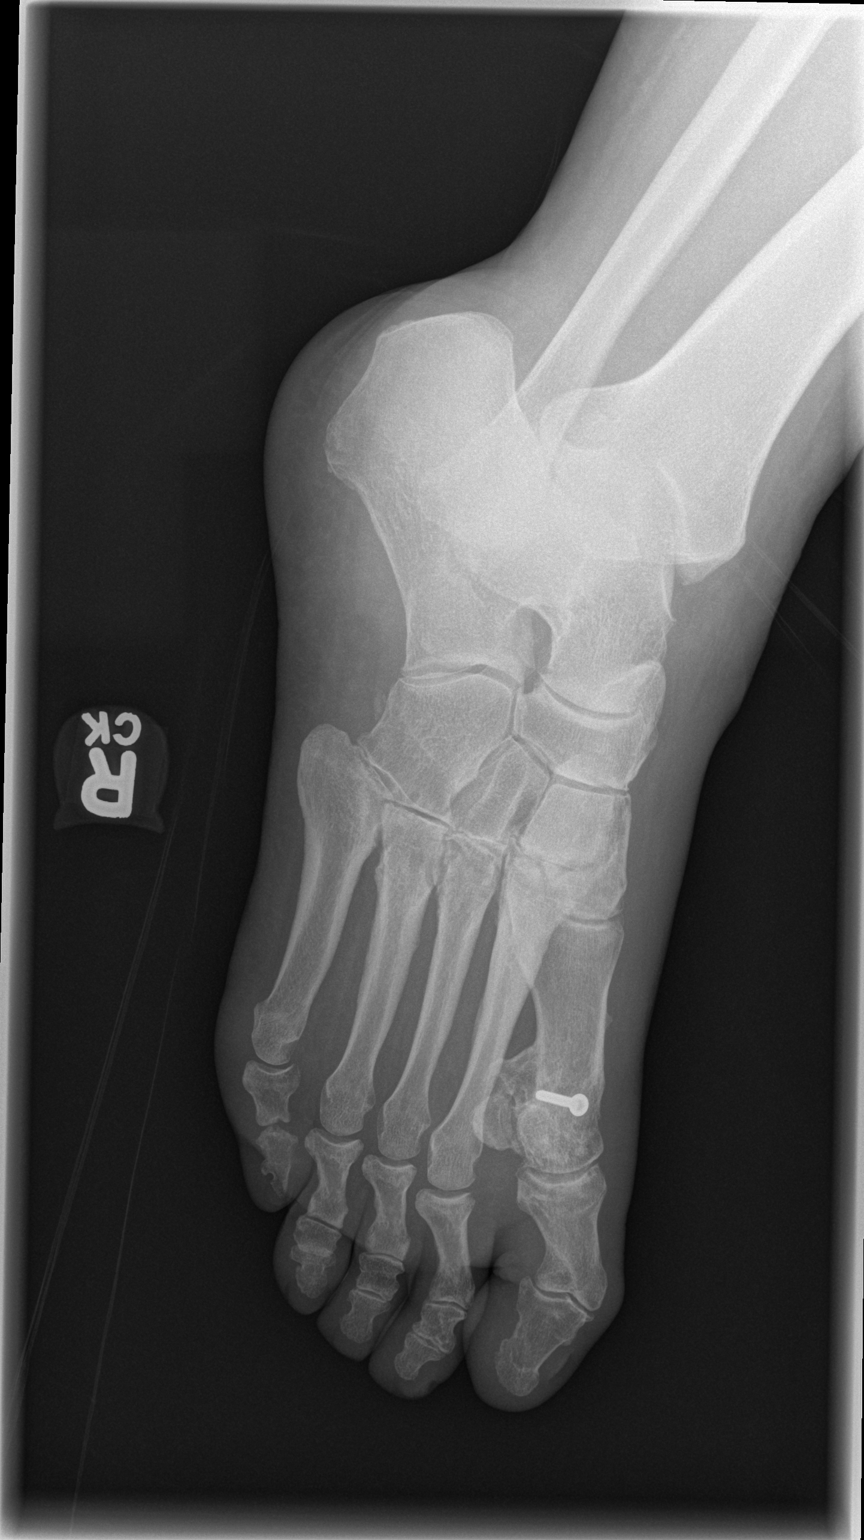

[t foot lat right]
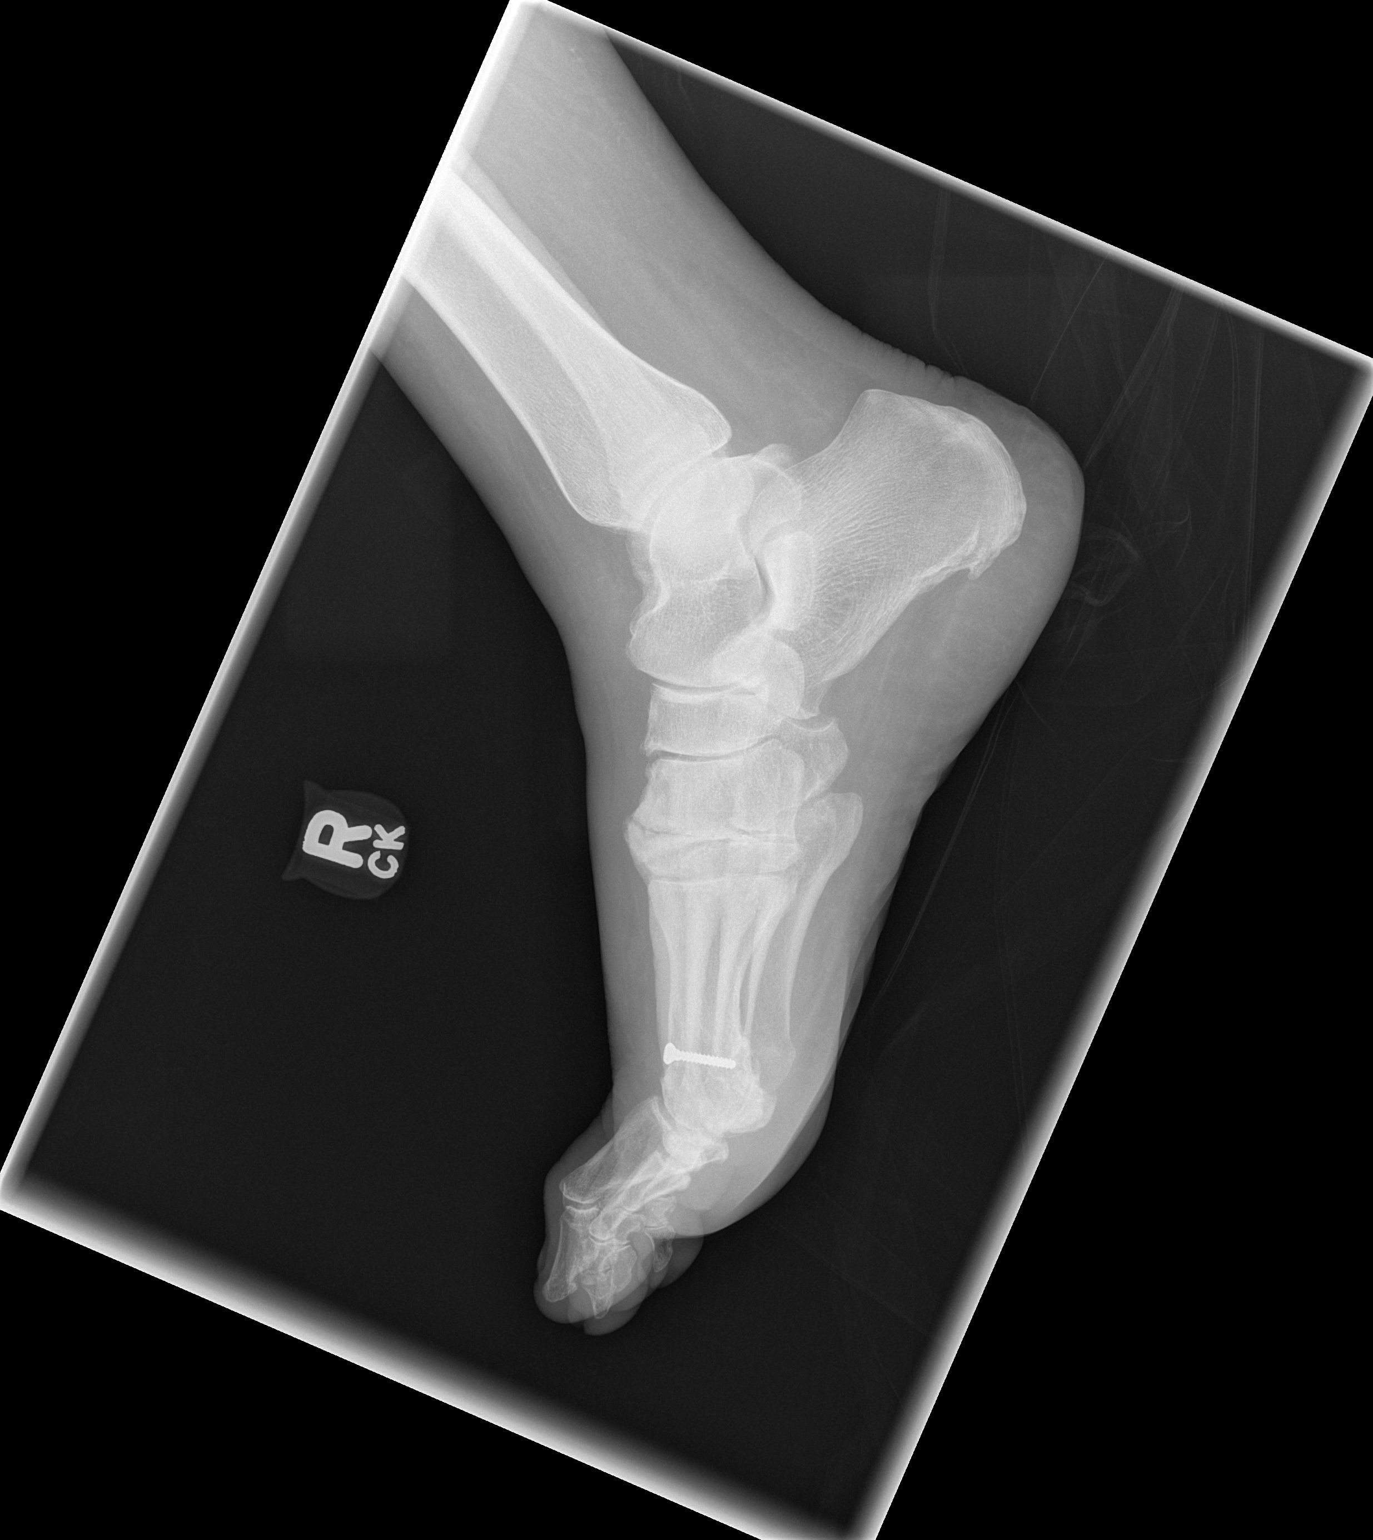

[3 of 3 positions shown; findings below may reference images not displayed]

FINDINGS: There is no evidence of fracture or dislocation. Chronic
postsurgical changes of the distal first metatarsal. There is no
evidence of arthropathy or other focal bone abnormality.
IMPRESSION: No findings of osteomyelitis.

By: Gudiela Bula M.D.

## 2019-09-22 ENCOUNTER — Other Ambulatory Visit: Payer: Self-pay

## 2019-09-22 DIAGNOSIS — I10 Essential (primary) hypertension: Secondary | ICD-10-CM

## 2019-09-24 ENCOUNTER — Telehealth: Payer: Self-pay

## 2019-09-24 DIAGNOSIS — Z780 Asymptomatic menopausal state: Secondary | ICD-10-CM

## 2019-09-24 DIAGNOSIS — E1169 Type 2 diabetes mellitus with other specified complication: Secondary | ICD-10-CM

## 2019-09-24 DIAGNOSIS — R609 Edema, unspecified: Secondary | ICD-10-CM

## 2019-09-24 DIAGNOSIS — F411 Generalized anxiety disorder: Secondary | ICD-10-CM

## 2019-09-24 DIAGNOSIS — I10 Essential (primary) hypertension: Secondary | ICD-10-CM

## 2019-09-24 DIAGNOSIS — K219 Gastro-esophageal reflux disease without esophagitis: Secondary | ICD-10-CM

## 2019-09-24 NOTE — Telephone Encounter (Signed)
Patient Pharmacy called in to speak with the nurse about the patient prescriptions. Please call the patient pharmacy at [phone] and the fax number [phone]

## 2019-09-27 NOTE — Telephone Encounter (Signed)
Called number provided and they couldn't find any concerns regarding medications.

## 2019-09-28 NOTE — Telephone Encounter (Signed)
Pt has switched over to [facility] with [facility].  Pt needs to get her meds switched over to [facility].  Please call them at [phone] to get medications sent over and fax # is [phone].

## 2019-09-29 MED ORDER — ATORVASTATIN CALCIUM 10 MG PO TABS: 10.0000 mg | ORAL_TABLET | Freq: Every day | ORAL | 0 refills | Status: DC

## 2019-09-29 MED ORDER — ESCITALOPRAM OXALATE 10 MG PO TABS: 10.0000 mg | ORAL_TABLET | Freq: Every day | ORAL | 0 refills | Status: DC

## 2019-09-29 MED ORDER — OMEPRAZOLE 40 MG PO CPDR: DELAYED_RELEASE_CAPSULE | ORAL | 0 refills | Status: DC

## 2019-09-29 MED ORDER — BISOPROLOL-HYDROCHLOROTHIAZIDE 10-6.25 MG PO TABS: 1.0000 | ORAL_TABLET | Freq: Every day | ORAL | 1 refills | Status: DC

## 2019-09-29 MED ORDER — ESTRADIOL 1 MG PO TABS: 1.0000 mg | ORAL_TABLET | Freq: Every day | ORAL | 0 refills | Status: DC

## 2019-09-29 MED ORDER — AMLODIPINE BESYLATE 5 MG PO TABS: 5.0000 mg | ORAL_TABLET | Freq: Every day | ORAL | 0 refills | Status: DC

## 2019-09-29 MED ORDER — FUROSEMIDE 20 MG PO TABS: 20.0000 mg | ORAL_TABLET | Freq: Every day | ORAL | 0 refills | Status: DC

## 2019-09-29 NOTE — Telephone Encounter (Signed)
Refills sent to new pharmacy.  

## 2019-09-30 ENCOUNTER — Other Ambulatory Visit: Payer: Self-pay

## 2019-09-30 DIAGNOSIS — I10 Essential (primary) hypertension: Secondary | ICD-10-CM

## 2019-09-30 MED ORDER — KLOR-CON M20 20 MEQ PO TBCR: EXTENDED_RELEASE_TABLET | ORAL | 1 refills | Status: DC

## 2019-09-30 NOTE — Telephone Encounter (Signed)
Patient called in to get a prescription refill for  KLOR-CON M20 20 MEQ tablet [[id number]]    Please send it to  [facility]  [place], [place] [address]  Phone:  [phone] Fax:  [phone]  DEA #:  --

## 2019-09-30 NOTE — Telephone Encounter (Signed)
Rx sent 

## 2019-10-11 ENCOUNTER — Other Ambulatory Visit: Payer: Self-pay | Admitting: Family Medicine

## 2019-10-11 DIAGNOSIS — R609 Edema, unspecified: Secondary | ICD-10-CM

## 2019-10-11 DIAGNOSIS — I10 Essential (primary) hypertension: Secondary | ICD-10-CM

## 2019-10-11 DIAGNOSIS — K219 Gastro-esophageal reflux disease without esophagitis: Secondary | ICD-10-CM

## 2019-10-11 DIAGNOSIS — F411 Generalized anxiety disorder: Secondary | ICD-10-CM

## 2019-10-11 DIAGNOSIS — Z780 Asymptomatic menopausal state: Secondary | ICD-10-CM

## 2019-10-11 DIAGNOSIS — E1169 Type 2 diabetes mellitus with other specified complication: Secondary | ICD-10-CM

## 2019-10-12 ENCOUNTER — Telehealth: Payer: Self-pay | Admitting: *Deleted

## 2019-10-12 DIAGNOSIS — E1110 Type 2 diabetes mellitus with ketoacidosis without coma: Secondary | ICD-10-CM

## 2019-10-12 DIAGNOSIS — I10 Essential (primary) hypertension: Secondary | ICD-10-CM

## 2019-10-12 MED ORDER — JANUMET 50-1000 MG PO TABS: ORAL_TABLET | ORAL | 0 refills | Status: DC

## 2019-10-12 MED ORDER — KLOR-CON M20 20 MEQ PO TBCR: EXTENDED_RELEASE_TABLET | ORAL | 0 refills | Status: DC

## 2019-10-12 NOTE — Telephone Encounter (Signed)
Received fax from [facility] requesting RXs for janumet, klor-con and tizanidine.  Refills sent for janumet and klor-con. Please advise Tizanidine request in PCP's absence?

## 2019-10-14 NOTE — Telephone Encounter (Signed)
Spoke with pt. She could not recall what she is taking tizanidine for. I did advise her not to take that along with robaxin. Pt then states maybe her pain management doctor had prescribed that for her. I advised her to reach out to them for further refills / recommendations and she voices understanding.

## 2019-10-14 NOTE — Telephone Encounter (Signed)
Looks like she has had Robaxin and Tizanidine in past but not from PCP. Check with her about why she needs it and let her know you cannot use both together. If she still wants it am willing to disp #30 of the Tizanidine til her PCP gets back.

## 2019-10-18 ENCOUNTER — Ambulatory Visit: Payer: Medicare HMO | Admitting: Family Medicine

## 2019-10-26 ENCOUNTER — Ambulatory Visit (INDEPENDENT_AMBULATORY_CARE_PROVIDER_SITE_OTHER): Payer: Medicare Other | Admitting: Family Medicine

## 2019-10-26 ENCOUNTER — Encounter: Payer: Self-pay | Admitting: Family Medicine

## 2019-10-26 ENCOUNTER — Other Ambulatory Visit: Payer: Self-pay

## 2019-10-26 VITALS — BP 118/60 | HR 64 | Temp 99.5°F | Resp 18 | Ht 69.0 in | Wt 182.6 lb

## 2019-10-26 DIAGNOSIS — I10 Essential (primary) hypertension: Secondary | ICD-10-CM

## 2019-10-26 DIAGNOSIS — R140 Abdominal distension (gaseous): Secondary | ICD-10-CM | POA: Insufficient documentation

## 2019-10-26 MED ORDER — KLOR-CON M20 20 MEQ PO TBCR: EXTENDED_RELEASE_TABLET | ORAL | 1 refills | Status: DC

## 2019-10-26 NOTE — Assessment & Plan Note (Signed)
Check labs  And [place] abd / pelvis F/u prn

## 2019-10-26 NOTE — Progress Notes (Signed)
Patient ID: Brittany [name], female    DOB: [birth year]  Age: 74 y.o. MRN: [id number]    Subjective:  Subjective  HPI ORLI DEGRAVE presents for f/u and refill on potassium She also c/o abd bloating --- she feels pregnant.  She denies constipation,  No burbing, heartburn , abd pain etc.   She had normal BM this am.    Review of Systems  Constitutional: Negative for appetite change, diaphoresis, fatigue and unexpected weight change.  Eyes: Negative for pain, redness and visual disturbance.  Respiratory: Negative for cough, chest tightness, shortness of breath and wheezing.   Cardiovascular: Negative for chest pain, palpitations and leg swelling.  Gastrointestinal: Positive for abdominal distention and blood in stool. Negative for abdominal pain, anal bleeding, constipation, diarrhea, nausea, rectal pain and vomiting.  Endocrine: Negative for cold intolerance, heat intolerance, polydipsia, polyphagia and polyuria.  Genitourinary: Negative for difficulty urinating, dysuria and frequency.  Neurological: Negative for dizziness, light-headedness, numbness and headaches.    History Past Medical History:  Diagnosis Date  . Anemia    as a younger woman  . Arthritis    "all over my body" (10/01/2017)  . Chronic pain    In pain clinic   . Constipation   . Depression   . Diabetic peripheral neuropathy ([place])   . GERD (gastroesophageal reflux disease)   . High cholesterol   . History of hiatal hernia   . Hypertension   . Type II diabetes mellitus ([place]) dx'd ~ 2008    She has a past surgical history that includes I & D extremity (Right, 08/10/2017); Toe Surgery (Right, 1980s); Colonoscopy; Amputation (Right, 09/29/2017); Lumbar disc surgery (1992); Posterior lumbar fusion (1996); Vaginal hysterectomy; Application if wound vac (Right, 09/29/2017); Amputation toe (Right, 04/24/2018); Total hip arthroplasty (Left, 11/16/2018); and Amputation (Right, 01/29/2019).   Her family history includes Breast  cancer (age of onset: 16) in her mother; Cancer in her mother; Coronary artery disease in an other family member; Depression in her mother; Diabetes in her brother and another family member; Heart disease (age of onset: 56) in her father; Hyperlipidemia in her brother; Hypertension in her brother; Kidney disease in her brother; Stroke in her brother.She reports that she has never smoked. She has never used smokeless tobacco. She reports that she does not drink alcohol and does not use drugs.  Current Outpatient Medications on File Prior to Visit  Medication Sig Dispense Refill  . amLODipine (NORVASC) 5 MG tablet TAKE 1 TABLET BY MOUTH  DAILY 90 tablet 3  . aspirin EC 81 MG tablet Take 1 tablet (81 mg total) by mouth 2 (two) times daily. (Patient taking differently: Take 81 mg by mouth at bedtime. ) 84 tablet 0  . atorvastatin (LIPITOR) 10 MG tablet Take 1 tablet (10 mg total) by mouth daily. 90 tablet 1  . bisoprolol-hydrochlorothiazide (ZIAC) 10-6.25 MG tablet Take 1 tablet by mouth daily. 90 tablet 1  . Blood Glucose Monitoring Suppl (ONE TOUCH ULTRA MINI) w/Device KIT Use as directed once a day.  Dx Code:  E11.42 1 each 0  . calcium carbonate (TUMS - DOSED IN MG ELEMENTAL CALCIUM) 500 MG chewable tablet Chew 1 tablet by mouth as needed for indigestion or heartburn.    . docusate sodium (COLACE) 100 MG capsule Take 100 mg by mouth 2 (two) times daily.    [name] escitalopram (LEXAPRO) 10 MG tablet Take 1 tablet (10 mg total) by mouth daily. 90 tablet 1  . estradiol (ESTRACE) 1 MG tablet  Take 1 tablet (1 mg total) by mouth daily. 90 tablet 1  . ferrous sulfate 325 (65 FE) MG tablet Take 325 mg by mouth daily with breakfast.    . folic acid (FOLVITE) 585 MCG tablet Take 800 mcg by mouth daily.     . furosemide (LASIX) 20 MG tablet Take 1 tablet (20 mg total) by mouth daily. 90 tablet 1  . gabapentin (NEURONTIN) 800 MG tablet Take 1 tablet (800 mg total) by mouth 4 (four) times daily. 360 tablet 3  .  glucose blood (ONE TOUCH ULTRA TEST) test strip Check Blood sugar once daily 100 each 12  . methocarbamol (ROBAXIN) 750 MG tablet Take 1 tablet (750 mg total) by mouth 2 (two) times daily as needed for muscle spasms. 60 tablet 0  . morphine (MS CONTIN) 30 MG 12 hr tablet Take 30 mg by mouth every 12 (twelve) hours. Per Dr. Nicholaus Bloom with Pain Mgmt  0  . Multiple Vitamins-Minerals (MULTIVITAMIN GUMMIES WOMENS PO) Take 1 tablet by mouth daily.     [name] omeprazole (PRILOSEC) 40 MG capsule Take 1 capsule (40 mg total) by mouth daily. 90 capsule 3  . ONETOUCH DELICA LANCETS 27P MISC Check blood sugar once daily 100 each 12  . sitaGLIPtin-metformin (JANUMET) 50-1000 MG tablet TAKE 2 TABLETS AT BEDTIME. PLEASE MAKE AN APPOINTMENT WITH YOUR DOCTOR 180 tablet 0  . Specialty Vitamins Products (BIOTIN PLUS KERATIN) 10000-100 MCG-MG TABS Take 1 tablet by mouth daily.    [name] tiZANidine (ZANAFLEX) 4 MG tablet Take 4 mg by mouth 3 (three) times daily.     . mupirocin ointment (BACTROBAN) 2 % Apply 1 application topically daily. Apply to the affected area 2 times a day (Patient not taking: Reported on 10/26/2019) 22 g 3   No current facility-administered medications on file prior to visit.     Objective:  Objective  Physical Exam Vitals and nursing note reviewed.  Constitutional:      Appearance: She is well-developed.  HENT:     Head: Normocephalic and atraumatic.  Eyes:     Conjunctiva/sclera: Conjunctivae normal.  Neck:     Thyroid: No thyromegaly.     Vascular: No carotid bruit or JVD.  Cardiovascular:     Rate and Rhythm: Normal rate and regular rhythm.     Heart sounds: Normal heart sounds. No murmur heard.   Pulmonary:     Effort: Pulmonary effort is normal. No respiratory distress.     Breath sounds: Normal breath sounds. No wheezing or rales.  Chest:     Chest wall: No tenderness.  Abdominal:     General: Abdomen is flat. Bowel sounds are normal. There is no distension.     Palpations:  Abdomen is soft. There is no mass.     Tenderness: There is no abdominal tenderness. There is no right CVA tenderness, left CVA tenderness, guarding or rebound.     Hernia: No hernia is present.  Musculoskeletal:     Cervical back: Normal range of motion and neck supple.  Neurological:     Mental Status: She is alert and oriented to person, place, and time.    BP 118/60 (BP Location: Left Arm, Patient Position: Sitting, Cuff Size: Normal)   Pulse 64   Temp 99.5 F (37.5 C) (Oral)   Resp 18   Ht 5' 9"  (1.753 m)   Wt 182 lb 9.6 oz (82.8 kg)   SpO2 97%   BMI 26.97 kg/m  Wt Readings from Last 3  Encounters:  10/26/19 182 lb 9.6 oz (82.8 kg)  08/30/19 173 lb (78.5 kg)  07/13/19 179 lb (81.2 kg)     Lab Results  Component Value Date   WBC 10.6 (H) 01/29/2019   HGB 12.2 01/29/2019   HCT 39.6 01/29/2019   PLT 244 01/29/2019   GLUCOSE 101 (H) 06/25/2019   CHOL 132 06/25/2019   TRIG 60.0 06/25/2019   HDL 68.80 06/25/2019   LDLCALC 51 06/25/2019   ALT 16 06/25/2019   AST 15 06/25/2019   NA 141 06/25/2019   K 4.3 06/25/2019   CL 102 06/25/2019   CREATININE 0.69 06/25/2019   BUN 15 06/25/2019   CO2 33 (H) 06/25/2019   TSH 1.39 05/15/2017   INR 1.0 11/12/2018   HGBA1C 6.5 06/25/2019   MICROALBUR <0.7 06/25/2019    No results found.   Assessment & Plan:  Plan  I am having Teri Legacy. Hams maintain her tiZANidine, docusate sodium, folic acid, OneTouch Delica Lancets 13Y, glucose blood, ONE TOUCH ULTRA MINI, Multiple Vitamins-Minerals (MULTIVITAMIN GUMMIES WOMENS PO), Biotin Plus Keratin, morphine, gabapentin, ferrous sulfate, aspirin EC, methocarbamol, calcium carbonate, mupirocin ointment, bisoprolol-hydrochlorothiazide, amLODipine, estradiol, furosemide, omeprazole, atorvastatin, escitalopram, Janumet, and Klor-Con M20.  Meds ordered this encounter  Medications  . KLOR-CON M20 20 MEQ tablet    Sig: Take 1 tablet by mouth once a day.    Dispense:  90 tablet    Refill:  1      Problem List Items Addressed This Visit      Unprioritized   Abdominal bloating - Primary    Check labs  And [place] abd / pelvis F/u prn       Relevant Orders   US Abdomen Complete   US Pelvic Complete With Transvaginal   CBC with Differential/Platelet   Comprehensive metabolic panel   Lipase   Amylase   Hypertension    Well controlled, no changes to meds. Encouraged heart healthy diet such as the DASH diet and exercise as tolerated.       Relevant Medications   KLOR-CON M20 20 MEQ tablet      Follow-up: No follow-ups on file.  Ann Held, DO

## 2019-10-26 NOTE — Assessment & Plan Note (Signed)
Well controlled, no changes to meds. Encouraged heart healthy diet such as the DASH diet and exercise as tolerated.  °

## 2019-10-26 NOTE — Patient Instructions (Signed)
Potassium Content of Foods  Potassium is a mineral found in many foods and drinks. It affects how the heart works, and helps keep fluids and minerals balanced in the body. The amount of potassium you need each day depends on your age and any medical conditions you may have. Talk to your health care provider or dietitian about how much potassium you need. The following lists of foods provide the general serving size for foods and the approximate amount of potassium in each serving, listed in milligrams (mg). Actual values may vary depending on the product and how it is processed. High in potassium The following foods and beverages have 200 mg or more of potassium per serving:  Apricots (raw) - 2 have 200 mg of potassium.  Apricots (dry) - 5 have 200 mg of potassium.  Artichoke - 1 medium has 345 mg of potassium.  Avocado -  fruit has 245 mg of potassium.  Banana - 1 medium fruit has 425 mg of potassium.  Lima or baked beans (canned) -  cup has 280 mg of potassium.  White beans (canned) -  cup has 595 mg potassium.  Beef roast - 3 oz has 320 mg of potassium.  Ground beef - 3 oz has 270 mg of potassium.  Beets (raw or cooked) -  cup has 260 mg of potassium.  Bran muffin - 2 oz has 300 mg of potassium.  Broccoli (cooked) -  cup has 230 mg of potassium.  Brussels sprouts -  cup has 250 mg of potassium.  Cantaloupe -  cup has 215 mg of potassium.  Cereal, 100% bran -  cup has 200-400 mg of potassium.  Cheeseburger -1 single fast food burger has 225-400 mg of potassium.  Chicken - 3 oz has 220 mg of potassium.  Clams (canned) - 3 oz has 535 mg of potassium.  Crab - 3 oz has 225 mg of potassium.  Dates - 5 have 270 mg of potassium.  Dried beans and peas -  cup has 300-475 mg of potassium.  Figs (dried) - 2 have 260 mg of potassium.  Fish (halibut, tuna, cod, snapper) - 3 oz has 480 mg of potassium.  Fish (salmon, haddock, swordfish, perch) - 3 oz has 300 mg of  potassium.  Fish (tuna, canned) - 3 oz has 200 mg of potassium.  French fries (fast food) - 3 oz has 470 mg of potassium.  Granola with fruit and nuts -  cup has 200 mg of potassium.  Grapefruit juice -  cup has 200 mg of potassium.  Honeydew melon -  cup has 200 mg of potassium.  Kale (raw) - 1 cup has 300 mg of potassium.  Kiwi - 1 medium fruit has 240 mg of potassium.  Kohlrabi, rutabaga, parsnips -  cup has 280 mg of potassium.  Lentils -  cup has 365 mg of potassium.  Mango - 1 each has 325 mg of potassium.  Milk (nonfat, low-fat, whole, buttermilk) - 1 cup has 350-380 mg of potassium.  Milk (chocolate) - 1 cup has 420 mg of potassium  Molasses - 1 Tbsp has 295 mg of potassium.  Mushrooms -  cup has 280 mg of potassium.  Nectarine - 1 each has 275 mg of potassium.  Nuts (almonds, peanuts, hazelnuts, [place], cashew, mixed) - 1 oz has 200 mg of potassium.  Nuts (pistachios) - 1 oz has 295 mg of potassium.  Orange - 1 fruit has 240 mg of potassium.  Orange juice -    cup has 235 mg of potassium.  Papaya -  medium fruit has 390 mg of potassium.  Peanut butter (chunky) - 2 Tbsp has 240 mg of potassium.  Peanut butter (smooth) - 2 Tbsp has 210 mg of potassium.  Pear - 1 medium (200 mg) of potassium.  Pomegranate - 1 whole fruit has 400 mg of potassium.  Pomegranate juice -  cup has 215 mg of potassium.  Pork - 3 oz has 350 mg of potassium.  Potato chips (salted) - 1 oz has 465 mg of potassium.  Potato (baked with skin) - 1 medium has 925 mg of potassium.  Potato (boiled) -  cup has 255 mg of potassium.  Potato (Mashed) -  cup has 330 mg of potassium.  Prune juice -  cup has 370 mg of potassium.  Prunes - 5 have 305 mg of potassium.  Pudding (chocolate) -  cup has 230 mg of potassium.  Pumpkin (canned) -  cup has 250 mg of potassium.  Raisins (seedless) -  cup has 270 mg of potassium.  Seeds (sunflower or pumpkin) - 1 oz has 240 mg of  potassium.  Soy milk - 1 cup has 300 mg of potassium.  Spinach (cooked) - 1/2 cup has 420 mg of potassium.  Spinach (canned) -  cup has 370 mg of potassium.  Sweet potato (baked with skin) - 1 medium has 450 mg of potassium.  Swiss chard -  cup has 480 mg of potassium.  Tomato or vegetable juice -  cup has 275 mg of potassium.  Tomato (sauce or puree) -  cup has 400-550 mg of potassium.  Tomato (raw) - 1 medium has 290 mg of potassium.  Tomato (canned) -  cup has 200-300 mg of potassium.  Turkey - 3 oz has 250 mg of potassium.  Wheat germ - 1 oz has 250 mg of potassium.  Winter squash -  cup has 250 mg of potassium.  Yogurt (plain or fruited) - 6 oz has 260-435 mg of potassium.  Zucchini -  cup has 220 mg of potassium. Moderate in potassium The following foods and beverages have 50-200 mg of potassium per serving:  Apple - 1 fruit has 150 mg of potassium  Apple juice -  cup has 150 mg of potassium  Applesauce -  cup has 90 mg of potassium  Apricot nectar -  cup has 140 mg of potassium  Asparagus (small spears) -  cup has 155 mg of potassium  Asparagus (large spears) - 6 have 155 mg of potassium  Bagel (cinnamon raisin) - 1 four-inch bagel has 130 mg of potassium  Bagel (egg or plain) - 1 four- inch bagel has 70 mg of potassium  Beans (green) -  cup has 90 mg of potassium  Beans (yellow) -  cup has 190 mg of potassium  Beer, regular - 12 oz has 100 mg of potassium  Beets (canned) -  cup has 125 mg of potassium  Blackberries -  cup has 115 mg of potassium  Blueberries -  cup has 60 mg of potassium  Bread (whole wheat) - 1 slice has 70 mg of potassium  Broccoli (raw) -  cup has 145 mg of potassium  Cabbage -  cup has 150 mg of potassium  Carrots (cooked or raw) -  cup has 180 mg of potassium  Cauliflower (raw) -  cup has 150 mg of potassium  Celery (raw) -  cup has 155 mg of potassium  Cereal, bran   flakes -  cup has 120-150 mg  of potassium  Cheese (cottage) -  cup has 110 mg of potassium  Cherries - 10 have 150 mg of potassium  Chocolate - 1 oz bar has 165 mg of potassium  Coffee (brewed) - 6 oz has 90 mg of potassium  Corn -  cup or 1 ear has 195 mg of potassium  Cucumbers -  cup has 80 mg of potassium  Egg - 1 large egg has 60 mg of potassium  Eggplant -  cup has 60 mg of potassium  Endive (raw) -  cup has 80 mg of potassium  English muffin - 1 has 65 mg of potassium  Fish (ocean perch) - 3 oz has 192 mg of potassium  Frankfurter, beef or pork - 1 has 75 mg of potassium  Fruit cocktail -  cup has 115 mg of potassium  Grape juice -  cup has 170 mg of potassium  Grapefruit -  fruit has 175 mg of potassium  Grapes -  cup has 155 mg of potassium  Greens: kale, turnip, collard -  cup has 110-150 mg of potassium  Ice cream or frozen yogurt (chocolate) -  cup has 175 mg of potassium  Ice cream or frozen yogurt (vanilla) -  cup has 120-150 mg of potassium  Lemons, limes - 1 each has 80 mg of potassium  Lettuce - 1 cup has 100 mg of potassium  Mixed vegetables -  cup has 150 mg of potassium  Mushrooms, raw -  cup has 110 mg of potassium  Nuts (walnuts, pecans, or macadamia) - 1 oz has 125 mg of potassium  Oatmeal -  cup has 80 mg of potassium  Okra -  cup has 110 mg of potassium  Onions -  cup has 120 mg of potassium  Peach - 1 has 185 mg of potassium  Peaches (canned) -  cup has 120 mg of potassium  Pears (canned) -  cup has 120 mg of potassium  Peas, green (frozen) -  cup has 90 mg of potassium  Peppers (Green) -  cup has 130 mg of potassium  Peppers (Red) -  cup has 160 mg of potassium  Pineapple juice -  cup has 165 mg of potassium  Pineapple (fresh or canned) -  cup has 100 mg of potassium  Plums - 1 has 105 mg of potassium  Pudding, vanilla -  cup has 150 mg of potassium  Raspberries -  cup has 90 mg of potassium  Rhubarb -  cup has  115 mg of potassium  Rice, wild -  cup has 80 mg of potassium  Shrimp - 3 oz has 155 mg of potassium  Spinach (raw) - 1 cup has 170 mg of potassium  Strawberries -  cup has 125 mg of potassium  Summer squash -  cup has 175-200 mg of potassium  Swiss chard (raw) - 1 cup has 135 mg of potassium  Tangerines - 1 fruit has 140 mg of potassium  Tea, brewed - 6 oz has 65 mg of potassium  Turnips -  cup has 140 mg of potassium  Watermelon -  cup has 85 mg of potassium  Wine (Red, table) - 5 oz has 180 mg of potassium  Wine (White, table) - 5 oz 100 mg of potassium Low in potassium The following foods and beverages have less than 50 mg of potassium per serving.  Bread (white) - 1 slice has 30 mg of potassium    Carbonated beverages - 12 oz has less than 5 mg of potassium  Cheese - 1 oz has 20-30 mg of potassium  Cranberries -  cup has 45 mg of potassium  Cranberry juice cocktail -  cup has 20 mg of potassium  Fats and oils - 1 Tbsp has less than 5 mg of potassium  Hummus - 1 Tbsp has 32 mg of potassium  Nectar (papaya, mango, or pear) -  cup has 35 mg of potassium  Rice (white or brown) -  cup has 50 mg of potassium  Spaghetti or macaroni (cooked) -  cup has 30 mg of potassium  Tortilla, flour or corn - 1 has 50 mg of potassium  Waffle - 1 four-inch waffle has 50 mg of potassium  Water chestnuts -  cup has 40 mg of potassium Summary  Potassium is a mineral found in many foods and drinks. It affects how the heart works, and helps keep fluids and minerals balanced in the body.  The amount of potassium you need each day depends on your age and any existing medical conditions you may have. Your health care provider or dietitian may recommend an amount of potassium that you should have each day. This information is not intended to replace advice given to you by your health care provider. Make sure you discuss any questions you have with your health care  provider. Document Revised: 03/07/2017 Document Reviewed: 06/19/2016 Elsevier Patient Education  2020 [employer] Inc[employer]   

## 2019-10-27 LAB — COMPREHENSIVE METABOLIC PANEL
ALT: 16 U/L (ref 0–35)
AST: 16 U/L (ref 0–37)
Albumin: 3.8 g/dL (ref 3.5–5.2)
Alkaline Phosphatase: 58 U/L (ref 39–117)
BUN: 20 mg/dL (ref 6–23)
CO2: 32 mEq/L (ref 19–32)
Calcium: 9.4 mg/dL (ref 8.4–10.5)
Chloride: 102 mEq/L (ref 96–112)
Creatinine, Ser: 0.67 mg/dL (ref 0.40–1.20)
GFR: 86.62 mL/min (ref >60.00)
Glucose, Bld: 112 mg/dL — ABNORMAL HIGH (ref 70–99)
Potassium: 4.2 mEq/L (ref 3.5–5.1)
Sodium: 140 mEq/L (ref 135–145)
Total Bilirubin: 0.2 mg/dL (ref 0.2–1.2)
Total Protein: 6.7 g/dL (ref 6.0–8.3)

## 2019-10-27 LAB — CBC WITH DIFFERENTIAL/PLATELET
Basophils Absolute: 0.1 10*3/uL (ref 0.0–0.1)
Basophils Relative: 0.7 % (ref 0.0–3.0)
Eosinophils Absolute: 0.1 10*3/uL (ref 0.0–0.7)
Eosinophils Relative: 1.5 % (ref 0.0–5.0)
HCT: 37.2 % (ref 36.0–46.0)
Hemoglobin: 12.6 g/dL (ref 12.0–15.0)
Lymphocytes Relative: 21.3 % (ref 12.0–46.0)
Lymphs Abs: 2 10*3/uL (ref 0.7–4.0)
MCHC: 33.7 g/dL (ref 30.0–36.0)
MCV: 84.4 fl (ref 78.0–100.0)
Monocytes Absolute: 0.7 10*3/uL (ref 0.1–1.0)
Monocytes Relative: 7.5 % (ref 3.0–12.0)
Neutro Abs: 6.5 10*3/uL (ref 1.4–7.7)
Neutrophils Relative %: 69 % (ref 43.0–77.0)
Platelets: 237 10*3/uL (ref 150.0–400.0)
RBC: 4.41 Mil/uL (ref 3.87–5.11)
RDW: 13.7 % (ref 11.5–15.5)
WBC: 9.4 10*3/uL (ref 4.0–10.5)

## 2019-10-27 LAB — AMYLASE: Amylase: 57 U/L (ref 27–131)

## 2019-10-27 LAB — LIPASE: Lipase: 52 U/L (ref 11.0–59.0)

## 2019-10-28 DIAGNOSIS — H5212 Myopia, left eye: Secondary | ICD-10-CM | POA: Diagnosis not present

## 2019-10-28 DIAGNOSIS — E1142 Type 2 diabetes mellitus with diabetic polyneuropathy: Secondary | ICD-10-CM | POA: Diagnosis not present

## 2019-10-28 DIAGNOSIS — Z79891 Long term (current) use of opiate analgesic: Secondary | ICD-10-CM | POA: Diagnosis not present

## 2019-10-28 DIAGNOSIS — E119 Type 2 diabetes mellitus without complications: Secondary | ICD-10-CM | POA: Diagnosis not present

## 2019-10-28 DIAGNOSIS — H52202 Unspecified astigmatism, left eye: Secondary | ICD-10-CM | POA: Diagnosis not present

## 2019-10-28 DIAGNOSIS — G894 Chronic pain syndrome: Secondary | ICD-10-CM | POA: Diagnosis not present

## 2019-10-28 DIAGNOSIS — G4700 Insomnia, unspecified: Secondary | ICD-10-CM | POA: Diagnosis not present

## 2019-10-28 DIAGNOSIS — H04123 Dry eye syndrome of bilateral lacrimal glands: Secondary | ICD-10-CM | POA: Diagnosis not present

## 2019-10-28 LAB — HM DIABETES EYE EXAM

## 2019-11-01 ENCOUNTER — Ambulatory Visit: Payer: Medicare Other

## 2019-11-01 NOTE — Chronic Care Management (AMB) (Signed)
Chronic Care Management Pharmacy  Name: Brittany [name]  MRN: [id number] DOB: 20-Oct-1945   Chief Complaint/ HPI  Brittany [name],  74 y.o. , female presents for their Follow-Up CCM visit with the clinical pharmacist via telephone due to COVID-19 Pandemic   PCP : Ann Held, DO  Their chronic conditions include: Diabetes, Hypertension, Hyperlipidemia, Depression, Neuropathy/Pain, Muscle Spasm, GERD, Estrogen Deficiency  Office Visits: 10/26/19: Visit w/ Dr. Etter Sjogren - Abdominal pain. Abdominal US. No med changes noted  Consult Visit: None since last CCM visit on 08/30/19.   Medications: Outpatient Encounter Medications as of 11/01/2019  Medication Sig  . amLODipine (NORVASC) 5 MG tablet TAKE 1 TABLET BY MOUTH  DAILY  . aspirin EC 81 MG tablet Take 1 tablet (81 mg total) by mouth 2 (two) times daily. (Patient taking differently: Take 81 mg by mouth at bedtime. )  . atorvastatin (LIPITOR) 10 MG tablet Take 1 tablet (10 mg total) by mouth daily.  . bisoprolol-hydrochlorothiazide (ZIAC) 10-6.25 MG tablet Take 1 tablet by mouth daily.  . Blood Glucose Monitoring Suppl (ONE TOUCH ULTRA MINI) w/Device KIT Use as directed once a Brittany [name].  Dx Code:  E11.42  . calcium carbonate (TUMS - DOSED IN MG ELEMENTAL CALCIUM) 500 MG chewable tablet Chew 1 tablet by mouth as needed for indigestion or heartburn.  . docusate sodium (COLACE) 100 MG capsule Take 100 mg by mouth 2 (two) times daily.  [name] escitalopram (LEXAPRO) 10 MG tablet Take 1 tablet (10 mg total) by mouth daily.  [name] estradiol (ESTRACE) 1 MG tablet Take 1 tablet (1 mg total) by mouth daily.  . ferrous sulfate 325 (65 FE) MG tablet Take 325 mg by mouth daily with breakfast.  . folic acid (FOLVITE) 703 MCG tablet Take 800 mcg by mouth daily.   . furosemide (LASIX) 20 MG tablet Take 1 tablet (20 mg total) by mouth daily.  [name] gabapentin (NEURONTIN) 800 MG tablet Take 1 tablet (800 mg total) by mouth 4 (four) times daily.  [name] glucose blood (ONE  TOUCH ULTRA TEST) test strip Check Blood sugar once daily  . KLOR-CON M20 20 MEQ tablet Take 1 tablet by mouth once a Brittany [name].  . methocarbamol (ROBAXIN) 750 MG tablet Take 1 tablet (750 mg total) by mouth 2 (two) times daily as needed for muscle spasms.  [name] morphine (MS CONTIN) 30 MG 12 hr tablet Take 30 mg by mouth every 12 (twelve) hours. Per Dr. Nicholaus Bloom with Pain Mgmt  . Multiple Vitamins-Minerals (MULTIVITAMIN GUMMIES WOMENS PO) Take 1 tablet by mouth daily.   . mupirocin ointment (BACTROBAN) 2 % Apply 1 application topically daily. Apply to the affected area 2 times a [name] (Patient not taking: Reported on 10/26/2019)  . omeprazole (PRILOSEC) 40 MG capsule Take 1 capsule (40 mg total) by mouth daily.  Brittany [name] DELICA LANCETS 50K MISC Check blood sugar once daily  . sitaGLIPtin-metformin (JANUMET) 50-1000 MG tablet TAKE 2 TABLETS AT BEDTIME. PLEASE MAKE AN APPOINTMENT WITH YOUR DOCTOR  . Specialty Vitamins Products (BIOTIN PLUS KERATIN) 10000-100 MCG-MG TABS Take 1 tablet by mouth daily.  [name] tiZANidine (ZANAFLEX) 4 MG tablet Take 4 mg by mouth 3 (three) times daily.    No facility-administered encounter medications on file as of 11/01/2019.   SDOH Screenings   Alcohol Screen:   . Last Alcohol Screening Score (AUDIT):   Depression (PHQ2-9): Medium Risk  . PHQ-2 Score: 9  Financial Resource Strain: Low Risk   . Difficulty of Paying  Living Expenses: Not very hard  Food Insecurity:   . Worried About Charity fundraiser in the Last Year:   . [place] in the Last Year:   Housing:   . Last Housing Risk Score:   Physical Activity:   . Days of Exercise per Week:   . Minutes of Exercise per Session:   Social Connections:   . Frequency of Communication with Friends and Family:   . Frequency of Social Gatherings with Friends and Family:   . Attends Religious Services:   . Active Member of Clubs or Organizations:   . Attends Archivist Meetings:   [name] Marital Status:     Stress:   . Feeling of Stress :   Tobacco Use: Low Risk   . Smoking Tobacco Use: Never Smoker  . Smokeless Tobacco Use: Never Used  Transportation Needs:   . Film/video editor (Medical):   [name] Lack of Transportation (Non-Medical):      Current Diagnosis/Assessment:  Goals Addressed            This Visit's Progress   . Chronic Care Managment Pharmacy Care Plan       CARE PLAN ENTRY  Current Barriers:  . Chronic Disease Management support, education, and care coordination needs related to Diabetes, Hypertension, Hyperlipidemia, Depression, Neuropathy/Pain, Muscle Spasm, GERD, Estrogen Deficiency   Hypertension . Pharmacist Clinical Goal(s): o Over the next 90 days, patient will work with PharmD and providers to maintain BP goal <140/90 . Current regimen:  o Amlodipine 70m daily, bisoprolol-hctz 10-6.25mg daily, furosemide 210mdaily . Intervention:  o Consider reducing furosemide to every other Brittany [name] pending Dr. LoEtter Sjogrenpproval . Patient self care activities - Over the next 90 days, patient will: o Maintain hypertension medication regimen.   Hyperlipidemia . Pharmacist Clinical Goal(s): o Over the next 90 days, patient will work with PharmD and providers to maintain LDL goal <100 . Current regimen:  o Atorvastatin 1047maily . Patient self care activities - Over the next 90 days, patient will: o Maintain cholesterol medication regimen.   Diabetes . Pharmacist Clinical Goal(s): o Over the next 90 days, patient will work with PharmD and providers to maintain A1c goal <7% . Current regimen:  o Janumet 50-1000m70mtabs daily at bedtime . Patient self care activities - Over the next 90 days, patient will: o Maintain diabetes medication regimen  GERD . Pharmacist Clinical Goal(s) o Over the next 90 days, patient will work with PharmD and providers to reduce symptoms of GERD . Current regimen:  o Omeprazole 40mg24mly . Interventions: o Discussed how hernias could  affect GERD Symptoms o Consult with Dr. LowneEtter Sjogrenetermine if further assessment is needed regarding patient's concern for hernia . Patient self care activities - Over the next 90 days, patient will: o Follow up with Dr. LowneEtter Sjogrenppropriate for hernia concerns   Estrogen Deficiency . Pharmacist Clinical Goal(s) o Over the next 90 days, patient will work with PharmD and providers to reduce symptoms of estrogen deficiency and polypharmacy . Current regimen:  o Estradiol 1mg d9my . Interventions: o Collaboration with provider regarding medication management (estradiol continuation vs discontinuation) . Patient self care activities - Over the next 90 days, patient will: o Inform PCP or Pharmacist if hot flashes recur  Medication management . Pharmacist Clinical Goal(s): o Over the next 90 days, patient will work with PharmD and providers to maintain optimal medication adherence . [facility]  o Comprehensive medication review performed. o Continue current medication management strategy . Patient self care activities - Over the next 90 days, patient will: o Focus on medication adherence by filling medications appropriately  o Take medications as prescribed o Report any questions or concerns to PharmD and/or provider(s)  Please see past updates related to this goal by clicking on the "Past Updates" button in the selected goal        Social Hx: Sofie = dog chiuaua and weenie dog  Widowed x 8 years. Has a son. 3 grand daughters. Oldest is 81 years old and has a birthday today.    Hypertension   CMP Latest Ref Rng & Units 10/26/2019 06/25/2019 05/20/2019  Glucose 70 - 99 mg/dL 112(H) 101(H) 96  BUN 6 - 23 mg/dL _0 Creatinine 0.40 - 1.20 mg/dL 0.67 0.69 0.77  Sodium 135 - 145 mEq/L 140 141 140  Potassium 3.5 - 5.1 mEq/L 4.2 4.3 4.0  Chloride 96 - 112 mEq/L 102 102 102  CO2 19 - 32 mEq/L 32 33(H) 33(H)  Calcium 8.4 - 10.5  mg/dL 9.4 9.0 9.2  Total Protein 6.0 - 8.3 g/dL 6.7 6.5 6.9  Total Bilirubin 0.2 - 1.2 mg/dL 0.2 0.3 0.4  Alkaline Phos 39 - 117 U/L 58 54 56  AST 0 - 37 U/L _1 ALT 0 - 35 U/L _2 Kidney Function Lab Results  Component Value Date/Time   CREATININE 0.67 10/26/2019 02:36 PM   CREATININE 0.69 06/25/2019 11:21 AM   GFR 86.62 10/26/2019 02:36 PM   GFRNONAA >60 01/29/2019 09:48 AM   GFRAA >60 01/29/2019 09:48 AM    BP today is:  <140/90 as shown below  Office blood pressures are  BP Readings from Last 3 Encounters:  10/26/19 118/60  08/30/19 126/78  06/25/19 115/64   Blood pressure goal <140/90  Patient has failed these meds in the past: None noted  Patient is currently controlled on the following medications:   Amlodipine 49m daily  Bisoprolol-hctz 10-6.25mg daily  Furosemide 23mdaily  Patient states she does not like having to urinate so frequently while taking furosemide.  Denies much edema unless she is standing for extended periods of time.  We discussed the possibility of her doing every other Masaichi Kracht dosing of furosemide pending Dr. LoEtter Sjogrenpproval.   Patient checks BP at home infrequently  Patient home BP readings are ranging: Could not define  Update 11/01/19 Dr. LoEtter Sjogrenk with moving furosemide to every other Eddye Broxterman. Pt has not checked home BP, but clinic BP at goal. Pt denies any swelling.  Plan -Continue current medications    GERD   Patient has failed these meds in past: None noted  Patient is currently controlled on the following medications: omeprazole 4057maily  Pt states she has a hernia and wonders if this could be the cause of her stomach feeling enlarged.  She states she has done some heavy lifting that she knows she probably should not have done. She is concerned about causing the hernia to become larger. Will consult with Dr. LowEtter Sjogrene discussed:  the risk/benefit of continuation vs discontinuation of long term PPI use  Update  11/01/19 Patient seen by Dr. LowEtter Sjogren 10/26/19 due to concern for abdominal bloating. She is being scheduled for imaging on 11/09/19  Plan -Continue current medications  -Complete imagining on 11/09/19  Estrogen Deficiency     Patient has failed these meds in past: None  noted  Patient is currently controlled on the following medications:   Estradiol 22m daily  Patient wonders if she still needs to take this medication. Will discuss further with Dr. LEtter Sjogren  She states she has taken estradiol for many years. Has not had a hot flash in years.   We discussed:  Risk/benefit of continuation/discontinuation of estradiol therapy   Update 11/01/19 Dr. LEtter Sjogrenok with patient D/Cing estradiol. Discussed tapering off of estradiol by taking 1/2 tab daily for a week then D/C. Informed patient to call me if she develops any hot flashes after D/C. Will follow up in about 1 month to make sure she tolerates this  Plan -Take estradiol 1/2 tab daily for one week then discontinue  Memory    Patient has failed these meds in past: None noted  Patient is currently uncontrolled on the following medications: None  Patient is concerned about her memory. We discussed different evaluations that could assess her memory.  Will assess memory at next visit  Update 11/01/19 Memory examinations would be best performed in person. Recommend patient do this in person at next visit with Dr. LEtter Sjogrenor come to see me in office for evaluation.  Plan -Consider completing memory assessment such as MMSE or Mini-cog   Miscellaneous Meds Tums Docusate Ferrous Sulfate (can consider D/C) Folic Acid (can consider D/C) Potassium Chloride 235m Multivitamin Biotin + Keratin  Meds to D/C from list  Mupirocin Methocarbamol  [facility] Offered [facility], but patient states she is happy with her current medication regimen and might consider this when she gets older.

## 2019-11-02 ENCOUNTER — Encounter: Payer: Self-pay | Admitting: Family Medicine

## 2019-11-06 NOTE — Patient Instructions (Signed)
Visit Information  Goals Addressed            This Visit's Progress   . Chronic Care Managment Pharmacy Care Plan       CARE PLAN ENTRY  Current Barriers:  . Chronic Disease Management support, education, and care coordination needs related to Diabetes, Hypertension, Hyperlipidemia, Depression, Neuropathy/Pain, Muscle Spasm, GERD, Estrogen Deficiency   Hypertension . Pharmacist Clinical Goal(s): o Over the next 90 days, patient will work with PharmD and providers to maintain BP goal <140/90 . Current regimen:  o Amlodipine 5mg  daily, bisoprolol-hctz 10-6.25mg  daily, furosemide 20mg  daily . Intervention:  o Consider reducing furosemide to every other [place] pending Dr. Etter Sjogren approval . Patient self care activities - Over the next 90 days, patient will: o Maintain hypertension medication regimen.   Hyperlipidemia . Pharmacist Clinical Goal(s): o Over the next 90 days, patient will work with PharmD and providers to maintain LDL goal <100 . Current regimen:  o Atorvastatin 10mg  daily . Patient self care activities - Over the next 90 days, patient will: o Maintain cholesterol medication regimen.   Diabetes . Pharmacist Clinical Goal(s): o Over the next 90 days, patient will work with PharmD and providers to maintain A1c goal <7% . Current regimen:  o Janumet 50-1000mg  2 tabs daily at bedtime . Patient self care activities - Over the next 90 days, patient will: o Maintain diabetes medication regimen  GERD . Pharmacist Clinical Goal(s) o Over the next 90 days, patient will work with PharmD and providers to reduce symptoms of GERD . Current regimen:  o Omeprazole 40mg  daily . Interventions: o Discussed how hernias could affect GERD Symptoms o Consult with Dr. Etter Sjogren to determine if further assessment is needed regarding patient's concern for hernia . Patient self care activities - Over the next 90 days, patient will: o Follow up with Dr. Etter Sjogren as appropriate for hernia  concerns   Estrogen Deficiency . Pharmacist Clinical Goal(s) o Over the next 90 days, patient will work with PharmD and providers to reduce symptoms of estrogen deficiency and polypharmacy . Current regimen:  o Estradiol 1mg  daily . Interventions: o Collaboration with provider regarding medication management (estradiol continuation vs discontinuation) . Patient self care activities - Over the next 90 days, patient will: o Inform PCP or Pharmacist if hot flashes recur  Medication management . Pharmacist Clinical Goal(s): o Over the next 90 days, patient will work with PharmD and providers to maintain optimal medication adherence . Current pharmacy: [facility]  . Interventions o Comprehensive medication review performed. o Continue current medication management strategy . Patient self care activities - Over the next 90 days, patient will: o Focus on medication adherence by filling medications appropriately  o Take medications as prescribed o Report any questions or concerns to PharmD and/or provider(s)  Please see past updates related to this goal by clicking on the "Past Updates" button in the selected goal         Patient verbalizes understanding of instructions provided today.   The pharmacy team will reach out to the patient again over the next 30 days.   De Blanch, PharmD Clinical Pharmacist [facility] [phone]

## 2019-11-09 ENCOUNTER — Ambulatory Visit (HOSPITAL_BASED_OUTPATIENT_CLINIC_OR_DEPARTMENT_OTHER): Payer: Medicare Other

## 2019-11-16 ENCOUNTER — Ambulatory Visit (HOSPITAL_BASED_OUTPATIENT_CLINIC_OR_DEPARTMENT_OTHER): Payer: Medicare Other

## 2019-11-16 ENCOUNTER — Other Ambulatory Visit (HOSPITAL_BASED_OUTPATIENT_CLINIC_OR_DEPARTMENT_OTHER): Payer: Medicare Other

## 2019-11-16 ENCOUNTER — Ambulatory Visit (HOSPITAL_BASED_OUTPATIENT_CLINIC_OR_DEPARTMENT_OTHER): Admission: RE | Admit: 2019-11-16 | Payer: Medicare Other | Source: Ambulatory Visit

## 2019-11-19 ENCOUNTER — Ambulatory Visit (HOSPITAL_BASED_OUTPATIENT_CLINIC_OR_DEPARTMENT_OTHER): Payer: Medicare Other

## 2019-11-23 ENCOUNTER — Other Ambulatory Visit: Payer: Self-pay | Admitting: Family Medicine

## 2019-11-23 ENCOUNTER — Ambulatory Visit (HOSPITAL_BASED_OUTPATIENT_CLINIC_OR_DEPARTMENT_OTHER)
Admission: RE | Admit: 2019-11-23 | Discharge: 2019-11-23 | Disposition: A | Discharge status: Home / Self Care | Payer: Medicare Other | Source: Ambulatory Visit | Attending: Family Medicine | Admitting: Family Medicine

## 2019-11-23 ENCOUNTER — Telehealth: Payer: Self-pay | Admitting: Family Medicine

## 2019-11-23 ENCOUNTER — Other Ambulatory Visit: Payer: Self-pay

## 2019-11-23 DIAGNOSIS — R140 Abdominal distension (gaseous): Secondary | ICD-10-CM

## 2019-11-23 NOTE — Telephone Encounter (Signed)
Manuela Schwartz from ultrasound called to let you know that patient couldn't tolerate transvaginal scan.

## 2019-11-23 NOTE — Telephone Encounter (Signed)
noted 

## 2019-11-24 ENCOUNTER — Other Ambulatory Visit: Payer: Self-pay | Admitting: Family Medicine

## 2019-11-24 DIAGNOSIS — R140 Abdominal distension (gaseous): Secondary | ICD-10-CM

## 2019-11-24 DIAGNOSIS — R935 Abnormal findings on diagnostic imaging of other abdominal regions, including retroperitoneum: Secondary | ICD-10-CM

## 2019-11-25 ENCOUNTER — Encounter: Payer: Self-pay | Admitting: Gastroenterology

## 2019-11-25 ENCOUNTER — Other Ambulatory Visit: Payer: Self-pay

## 2019-11-25 DIAGNOSIS — R140 Abdominal distension (gaseous): Secondary | ICD-10-CM

## 2019-12-01 ENCOUNTER — Emergency Department (HOSPITAL_COMMUNITY)
Admission: EM | Admit: 2019-12-01 | Discharge: 2019-12-02 | Disposition: A | Discharge status: Home / Self Care | Payer: Medicare Other | Attending: Emergency Medicine | Admitting: Emergency Medicine

## 2019-12-01 ENCOUNTER — Emergency Department (HOSPITAL_COMMUNITY): Payer: Medicare Other

## 2019-12-01 DIAGNOSIS — E1140 Type 2 diabetes mellitus with diabetic neuropathy, unspecified: Secondary | ICD-10-CM | POA: Diagnosis not present

## 2019-12-01 DIAGNOSIS — M545 Low back pain: Secondary | ICD-10-CM | POA: Diagnosis not present

## 2019-12-01 DIAGNOSIS — Z041 Encounter for examination and observation following transport accident: Secondary | ICD-10-CM | POA: Diagnosis not present

## 2019-12-01 DIAGNOSIS — Z7984 Long term (current) use of oral hypoglycemic drugs: Secondary | ICD-10-CM | POA: Diagnosis not present

## 2019-12-01 DIAGNOSIS — S299XXA Unspecified injury of thorax, initial encounter: Secondary | ICD-10-CM | POA: Diagnosis present

## 2019-12-01 DIAGNOSIS — Y929 Unspecified place or not applicable: Secondary | ICD-10-CM | POA: Insufficient documentation

## 2019-12-01 DIAGNOSIS — Y939 Activity, unspecified: Secondary | ICD-10-CM | POA: Diagnosis not present

## 2019-12-01 DIAGNOSIS — J9811 Atelectasis: Secondary | ICD-10-CM | POA: Diagnosis not present

## 2019-12-01 DIAGNOSIS — M791 Myalgia, unspecified site: Secondary | ICD-10-CM | POA: Insufficient documentation

## 2019-12-01 DIAGNOSIS — R1032 Left lower quadrant pain: Secondary | ICD-10-CM | POA: Diagnosis not present

## 2019-12-01 DIAGNOSIS — M26629 Arthralgia of temporomandibular joint, unspecified side: Secondary | ICD-10-CM | POA: Insufficient documentation

## 2019-12-01 DIAGNOSIS — Y999 Unspecified external cause status: Secondary | ICD-10-CM | POA: Diagnosis not present

## 2019-12-01 DIAGNOSIS — S2242XA Multiple fractures of ribs, left side, initial encounter for closed fracture: Secondary | ICD-10-CM

## 2019-12-01 DIAGNOSIS — I700 Atherosclerosis of aorta: Secondary | ICD-10-CM | POA: Diagnosis not present

## 2019-12-01 DIAGNOSIS — I10 Essential (primary) hypertension: Secondary | ICD-10-CM | POA: Insufficient documentation

## 2019-12-01 DIAGNOSIS — M47816 Spondylosis without myelopathy or radiculopathy, lumbar region: Secondary | ICD-10-CM | POA: Diagnosis not present

## 2019-12-01 DIAGNOSIS — Z79899 Other long term (current) drug therapy: Secondary | ICD-10-CM | POA: Diagnosis not present

## 2019-12-01 DIAGNOSIS — Z7982 Long term (current) use of aspirin: Secondary | ICD-10-CM | POA: Insufficient documentation

## 2019-12-01 DIAGNOSIS — M48061 Spinal stenosis, lumbar region without neurogenic claudication: Secondary | ICD-10-CM | POA: Diagnosis not present

## 2019-12-01 DIAGNOSIS — R0789 Other chest pain: Secondary | ICD-10-CM | POA: Diagnosis not present

## 2019-12-01 DIAGNOSIS — M4316 Spondylolisthesis, lumbar region: Secondary | ICD-10-CM | POA: Diagnosis not present

## 2019-12-01 DIAGNOSIS — M47812 Spondylosis without myelopathy or radiculopathy, cervical region: Secondary | ICD-10-CM | POA: Diagnosis not present

## 2019-12-01 DIAGNOSIS — M549 Dorsalgia, unspecified: Secondary | ICD-10-CM | POA: Insufficient documentation

## 2019-12-01 DIAGNOSIS — I2510 Atherosclerotic heart disease of native coronary artery without angina pectoris: Secondary | ICD-10-CM | POA: Diagnosis not present

## 2019-12-01 NOTE — ED Notes (Signed)
Called pt x4 for room, no response. 
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
## 2019-12-02 ENCOUNTER — Emergency Department (HOSPITAL_COMMUNITY): Payer: Medicare Other

## 2019-12-02 ENCOUNTER — Other Ambulatory Visit: Payer: Self-pay

## 2019-12-02 DIAGNOSIS — Z041 Encounter for examination and observation following transport accident: Secondary | ICD-10-CM | POA: Diagnosis not present

## 2019-12-02 DIAGNOSIS — J9811 Atelectasis: Secondary | ICD-10-CM | POA: Diagnosis not present

## 2019-12-02 DIAGNOSIS — I2510 Atherosclerotic heart disease of native coronary artery without angina pectoris: Secondary | ICD-10-CM | POA: Diagnosis not present

## 2019-12-02 DIAGNOSIS — S2242XA Multiple fractures of ribs, left side, initial encounter for closed fracture: Secondary | ICD-10-CM | POA: Diagnosis not present

## 2019-12-02 DIAGNOSIS — M47816 Spondylosis without myelopathy or radiculopathy, lumbar region: Secondary | ICD-10-CM | POA: Diagnosis not present

## 2019-12-02 DIAGNOSIS — I700 Atherosclerosis of aorta: Secondary | ICD-10-CM | POA: Diagnosis not present

## 2019-12-02 DIAGNOSIS — M47812 Spondylosis without myelopathy or radiculopathy, cervical region: Secondary | ICD-10-CM | POA: Diagnosis not present

## 2019-12-02 DIAGNOSIS — M4316 Spondylolisthesis, lumbar region: Secondary | ICD-10-CM | POA: Diagnosis not present

## 2019-12-02 DIAGNOSIS — M48061 Spinal stenosis, lumbar region without neurogenic claudication: Secondary | ICD-10-CM | POA: Diagnosis not present

## 2019-12-02 MED ORDER — MORPHINE SULFATE ER 15 MG PO TBCR
30.0000 mg | EXTENDED_RELEASE_TABLET | Freq: Two times a day (BID) | ORAL | Status: DC
  Administered 2019-12-02: 30 mg via ORAL
  Filled 2019-12-02: qty 2

## 2019-12-02 MED ORDER — DOXYCYCLINE HYCLATE 100 MG PO CAPS
100.0000 mg | ORAL_CAPSULE | Freq: Two times a day (BID) | ORAL | 0 refills | Status: AC
Start: 2019-12-02 — End: 2019-12-09

## 2019-12-02 NOTE — ED Notes (Signed)
Patient verbalizes understanding of discharge instructions. Opportunity for questioning and answers were provided. Armband removed by staff, pt discharged from ED.  
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
## 2019-12-02 NOTE — ED Notes (Signed)
Pt transported to CT ?
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
## 2019-12-03 ENCOUNTER — Other Ambulatory Visit: Payer: Self-pay | Admitting: Orthopedic Surgery

## 2019-12-06 ENCOUNTER — Telehealth: Payer: Self-pay | Admitting: Pharmacist

## 2019-12-06 ENCOUNTER — Telehealth: Payer: Self-pay | Admitting: *Deleted
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
## 2019-12-06 NOTE — Telephone Encounter (Signed)
Pt called regarding which pharmacy Rx was e-scribed to.  RNCM reviewed chart to access After Visit Summary and found that Rx was printed and stayed on the line until she located it.  Advised pt to take printed Rx to pharmacy of choice.

## 2019-12-09 ENCOUNTER — Ambulatory Visit: Payer: Medicare Other | Admitting: Family Medicine

## 2019-12-13 ENCOUNTER — Other Ambulatory Visit: Payer: Self-pay | Admitting: Family Medicine

## 2019-12-13 DIAGNOSIS — E1110 Type 2 diabetes mellitus with ketoacidosis without coma: Secondary | ICD-10-CM

## 2019-12-17 ENCOUNTER — Ambulatory Visit: Payer: Medicare Other | Admitting: Family Medicine

## 2019-12-17 DIAGNOSIS — Z0289 Encounter for other administrative examinations: Secondary | ICD-10-CM

## 2019-12-23 ENCOUNTER — Other Ambulatory Visit: Payer: Self-pay | Admitting: Family Medicine

## 2019-12-23 DIAGNOSIS — R935 Abnormal findings on diagnostic imaging of other abdominal regions, including retroperitoneum: Secondary | ICD-10-CM

## 2019-12-23 DIAGNOSIS — R140 Abdominal distension (gaseous): Secondary | ICD-10-CM

## 2019-12-25 ENCOUNTER — Ambulatory Visit (HOSPITAL_COMMUNITY): Admission: RE | Admit: 2019-12-25 | Payer: Medicare Other | Source: Ambulatory Visit

## 2019-12-27 ENCOUNTER — Other Ambulatory Visit: Payer: Self-pay

## 2019-12-27 ENCOUNTER — Encounter: Payer: Self-pay | Admitting: Family Medicine

## 2019-12-27 ENCOUNTER — Ambulatory Visit (INDEPENDENT_AMBULATORY_CARE_PROVIDER_SITE_OTHER): Payer: Medicare Other | Admitting: Family Medicine

## 2019-12-27 VITALS — BP 120/84 | HR 63 | Temp 98.3°F | Resp 18 | Ht 69.0 in | Wt 185.6 lb

## 2019-12-27 DIAGNOSIS — E1142 Type 2 diabetes mellitus with diabetic polyneuropathy: Secondary | ICD-10-CM | POA: Diagnosis not present

## 2019-12-27 DIAGNOSIS — E785 Hyperlipidemia, unspecified: Secondary | ICD-10-CM

## 2019-12-27 DIAGNOSIS — G894 Chronic pain syndrome: Secondary | ICD-10-CM | POA: Diagnosis not present

## 2019-12-27 DIAGNOSIS — I10 Essential (primary) hypertension: Secondary | ICD-10-CM

## 2019-12-27 DIAGNOSIS — Z79891 Long term (current) use of opiate analgesic: Secondary | ICD-10-CM | POA: Diagnosis not present

## 2019-12-27 DIAGNOSIS — Z23 Encounter for immunization: Secondary | ICD-10-CM | POA: Diagnosis not present

## 2019-12-27 DIAGNOSIS — Z0000 Encounter for general adult medical examination without abnormal findings: Secondary | ICD-10-CM | POA: Diagnosis not present

## 2019-12-27 DIAGNOSIS — E1169 Type 2 diabetes mellitus with other specified complication: Secondary | ICD-10-CM | POA: Diagnosis not present

## 2019-12-27 DIAGNOSIS — E1165 Type 2 diabetes mellitus with hyperglycemia: Secondary | ICD-10-CM | POA: Diagnosis not present

## 2019-12-27 DIAGNOSIS — G4700 Insomnia, unspecified: Secondary | ICD-10-CM | POA: Diagnosis not present

## 2019-12-27 MED ORDER — ONETOUCH DELICA LANCETS 33G MISC: 12 refills | Status: DC

## 2019-12-27 MED ORDER — GLUCOSE BLOOD VI STRP: ORAL_STRIP | 12 refills | Status: DC

## 2019-12-27 NOTE — Progress Notes (Signed)
Subjective:     Brittany [name] is a 74 y.o. female and is here for a comprehensive physical exam. The patient reports no problems.  She is also f/u bp and chol and will needs labs and refills   Social History   Socioeconomic History  . Marital status: Widowed    Spouse name: Not on file  . Number of children: Not on file  . Years of education: Not on file  . Highest education level: Not on file  Occupational History  . Not on file  Tobacco Use  . Smoking status: Never Smoker  . Smokeless tobacco: Never Used  Vaping Use  . Vaping Use: Never used  Substance and Sexual Activity  . Alcohol use: Never  . Drug use: Never  . Sexual activity: Not Currently    Partners: Male  Other Topics Concern  . Not on file  Social History Narrative  . Not on file   Social Determinants of Health   Financial Resource Strain: Low Risk   . Difficulty of Paying Living Expenses: Not very hard  Food Insecurity:   . Worried About Running Out of Food in the Last Year: Not on file  . Ran Out of Food in the Last Year: Not on file  Transportation Needs:   . Lack of Transportation (Medical): Not on file  . Lack of Transportation (Non-Medical): Not on file  Physical Activity:   . Days of Exercise per Week: Not on file  . Minutes of Exercise per Session: Not on file  Stress:   . Feeling of Stress : Not on file  Social Connections:   . Frequency of Communication with Friends and Family: Not on file  . Frequency of Social Gatherings with Friends and Family: Not on file  . Attends Religious Services: Not on file  . Active Member of Clubs or Organizations: Not on file  . Attends Club or Organization Meetings: Not on file  . Marital Status: Not on file  Intimate Partner Violence:   . Fear of Current or Ex-Partner: Not on file  . Emotionally Abused: Not on file  . Physically Abused: Not on file  . Sexually Abused: Not on file   Health Maintenance  Topic Date Due  . COVID-19 Vaccine (1) Never done   . MAMMOGRAM  05/01/2018  . INFLUENZA VACCINE  11/07/2019  . HEMOGLOBIN A1C  12/26/2019  . COLONOSCOPY  06/11/2020  . FOOT EXAM  06/24/2020  . OPHTHALMOLOGY EXAM  10/27/2020  . TETANUS/TDAP  03/26/2028  . DEXA SCAN  Completed  . Hepatitis C Screening  Completed  . PNA vac Low Risk Adult  Completed    The following portions of the patient's history were reviewed and updated as appropriate:  She  has a past medical history of Anemia, Arthritis, Chronic pain, Constipation, Depression, Diabetic peripheral neuropathy (HCC), GERD (gastroesophageal reflux disease), High cholesterol, History of hiatal hernia, Hypertension, and Type II diabetes mellitus (HCC) (dx'd ~ 2008). She does not have any pertinent problems on file. She  has a past surgical history that includes I & D extremity (Right, 08/10/2017); Toe Surgery (Right, 1980s); Colonoscopy; Amputation (Right, 09/29/2017); Lumbar disc surgery (1992); Posterior lumbar fusion (1996); Vaginal hysterectomy; Application if wound vac (Right, 09/29/2017); Amputation toe (Right, 04/24/2018); Total hip arthroplasty (Left, 11/16/2018); and Amputation (Right, 01/29/2019). Her family history includes Breast cancer (age of onset: 75) in her mother; Cancer in her mother; Coronary artery disease in an other family member; Depression in her mother; Diabetes in   her brother and another family member; Heart disease (age of onset: 59) in her father; Hyperlipidemia in her brother; Hypertension in her brother; Kidney disease in her brother; Stroke in her brother. She  reports that she has never smoked. She has never used smokeless tobacco. She reports that she does not drink alcohol and does not use drugs. She has a current medication list which includes the following prescription(s): amlodipine, aspirin ec, atorvastatin, bisoprolol-hydrochlorothiazide, one touch ultra mini, calcium carbonate, docusate sodium, escitalopram, estradiol, ferrous sulfate, folic acid, furosemide,  gabapentin, glucose blood, klor-con m20, methocarbamol, morphine, multiple vitamins-minerals, mupirocin ointment, omeprazole, onetouch delica lancets 33g, janumet, biotin plus keratin, and tizanidine. Current Outpatient Medications on File Prior to Visit  Medication Sig Dispense Refill  . amLODipine (NORVASC) 5 MG tablet TAKE 1 TABLET BY MOUTH  DAILY 90 tablet 3  . aspirin EC 81 MG tablet Take 1 tablet (81 mg total) by mouth 2 (two) times daily. (Patient taking differently: Take 81 mg by mouth at bedtime. ) 84 tablet 0  . atorvastatin (LIPITOR) 10 MG tablet Take 1 tablet (10 mg total) by mouth daily. 90 tablet 1  . bisoprolol-hydrochlorothiazide (ZIAC) 10-6.25 MG tablet Take 1 tablet by mouth daily. 90 tablet 1  . Blood Glucose Monitoring Suppl (ONE TOUCH ULTRA MINI) w/Device KIT Use as directed once a day.  Dx Code:  E11.42 1 each 0  . calcium carbonate (TUMS - DOSED IN MG ELEMENTAL CALCIUM) 500 MG chewable tablet Chew 1 tablet by mouth as needed for indigestion or heartburn.    . docusate sodium (COLACE) 100 MG capsule Take 100 mg by mouth 2 (two) times daily.    . escitalopram (LEXAPRO) 10 MG tablet Take 1 tablet (10 mg total) by mouth daily. 90 tablet 1  . estradiol (ESTRACE) 1 MG tablet Take 1 tablet (1 mg total) by mouth daily. 90 tablet 1  . ferrous sulfate 325 (65 FE) MG tablet Take 325 mg by mouth daily with breakfast.    . folic acid (FOLVITE) 800 MCG tablet Take 800 mcg by mouth daily.     . furosemide (LASIX) 20 MG tablet Take 1 tablet (20 mg total) by mouth daily. 90 tablet 1  . gabapentin (NEURONTIN) 800 MG tablet Take 1 tablet (800 mg total) by mouth 4 (four) times daily. 360 tablet 3  . KLOR-CON M20 20 MEQ tablet Take 1 tablet by mouth once a day. 90 tablet 1  . methocarbamol (ROBAXIN) 750 MG tablet Take 1 tablet (750 mg total) by mouth 2 (two) times daily as needed for muscle spasms. 60 tablet 0  . morphine (MS CONTIN) 30 MG 12 hr tablet Take 30 mg by mouth every 12 (twelve) hours.  Per Dr. Mark Phillips with Pain Mgmt  0  . Multiple Vitamins-Minerals (MULTIVITAMIN GUMMIES WOMENS PO) Take 1 tablet by mouth daily.     . mupirocin ointment (BACTROBAN) 2 % Apply 1 application topically daily. Apply to the affected area 2 times a day 22 g 3  . omeprazole (PRILOSEC) 40 MG capsule Take 1 capsule (40 mg total) by mouth daily. 90 capsule 3  . sitaGLIPtin-metformin (JANUMET) 50-1000 MG tablet TAKE 2 TABLETS BY MOUTH AT  BEDTIME 180 tablet 3  . Specialty Vitamins Products (BIOTIN PLUS KERATIN) 10000-100 MCG-MG TABS Take 1 tablet by mouth daily.    . tiZANidine (ZANAFLEX) 4 MG tablet Take 4 mg by mouth 3 (three) times daily.      No current facility-administered medications on file prior to visit.     She is allergic to sulfonamide derivatives..  Review of Systems Review of Systems  Constitutional: Negative for activity change, appetite change and fatigue.  HENT: Negative for hearing loss, congestion, tinnitus and ear discharge.  dentist q6m Eyes: Negative for visual disturbance (see optho q1y -- vision corrected to 20/20 with glasses).  Respiratory: Negative for cough, chest tightness and shortness of breath.   Cardiovascular: Negative for chest pain, palpitations and leg swelling.  Gastrointestinal: Negative for abdominal pain, diarrhea, constipation and abdominal distention.  Genitourinary: Negative for urgency, frequency, decreased urine volume and difficulty urinating.  Musculoskeletal: Negative for back pain, arthralgias and gait problem.  Skin: Negative for color change, pallor and rash.  Neurological: Negative for dizziness, light-headedness, numbness and headaches.  Hematological: Negative for adenopathy. Does not bruise/bleed easily.  Psychiatric/Behavioral: Negative for suicidal ideas, confusion, sleep disturbance, self-injury, dysphoric mood, decreased concentration and agitation.       Objective:    BP 120/84 (BP Location: Right Arm, Patient Position: Sitting,  Cuff Size: Normal)   Pulse 63   Temp 98.3 F (36.8 C) (Oral)   Resp 18   Ht 5' 9" (1.753 m)   Wt 185 lb 9.6 oz (84.2 kg)   SpO2 96%   BMI 27.41 kg/m  General appearance: alert, cooperative, appears stated age and no distress Head: Normocephalic, without obvious abnormality, atraumatic Eyes: negative findings: lids and lashes normal, conjunctivae and sclerae normal and pupils equal, round, reactive to light and accomodation Ears: normal TM's and external ear canals both ears Neck: no adenopathy, no carotid bruit, no JVD, supple, symmetrical, trachea midline and thyroid not enlarged, symmetric, no tenderness/mass/nodules Back: symmetric, no curvature. ROM normal. No CVA tenderness. Lungs: clear to auscultation bilaterally Breasts: deferred Heart: regular rate and rhythm, S1, S2 normal, no murmur, click, rub or gallop Abdomen: soft, non-tender; bowel sounds normal; no masses,  no organomegaly Pelvic: not indicated; status post hysterectomy, negative ROS Extremities: extremities normal, atraumatic, no cyanosis or edema Pulses: 2+ and symmetric Skin: Skin color, texture, turgor normal. No rashes or lesions Lymph nodes: Cervical, supraclavicular, and axillary nodes normal. Neurologic: Alert and oriented X 3, normal strength and tone. Normal symmetric reflexes. Normal coordination and gait    Assessment:    Healthy female exam.   Plan:    ghm utd  Check labs  See After Visit Summary for Counseling Recommendations    1. Need for immunization against influenza  - Flu Vaccine QUAD High Dose(Fluad)  2. Uncontrolled type 2 diabetes mellitus with hyperglycemia (HCC) hgba1c to be checked , minimize simple carbs. Increase exercise as tolerated. Continue current meds  - OneTouch Delica Lancets 33G MISC; Check blood sugar once daily  Dispense: 100 each; Refill: 12 - glucose blood (ONE TOUCH ULTRA TEST) test strip; Check Blood sugar once daily  Dispense: 100 each; Refill: 12 -  Comprehensive metabolic panel - Hemoglobin A1c - Lipid panel - Microalbumin / creatinine urine ratio  3. Hyperlipidemia associated with type 2 diabetes mellitus (HCC) Tolerating statin, encouraged heart healthy diet, avoid trans fats, minimize simple carbs and saturated fats. Increase exercise as tolerated - Comprehensive metabolic panel - Hemoglobin A1c - Lipid panel - Microalbumin / creatinine urine ratio  4. Essential hypertension Well controlled, no changes to meds. Encouraged heart healthy diet such as the DASH diet and exercise as tolerated.  - Comprehensive metabolic panel - Hemoglobin A1c - Lipid panel - Microalbumin / creatinine urine ratio  5. Preventative health care See above   - Comprehensive metabolic panel - Hemoglobin   A1c - Lipid panel - Microalbumin / creatinine urine ratio  

## 2019-12-27 NOTE — Patient Instructions (Signed)
Preventive Care 72 Years and Older, Female Preventive care refers to lifestyle choices and visits with your health care provider that can promote health and wellness. This includes:  A yearly physical exam. This is also called an annual well check.  Regular dental and eye exams.  Immunizations.  Screening for certain conditions.  Healthy lifestyle choices, such as diet and exercise. What can I expect for my preventive care visit? Physical exam Your health care provider will check:  Height and weight. These may be used to calculate body mass index (BMI), which is a measurement that tells if you are at a healthy weight.  Heart rate and blood pressure.  Your skin for abnormal spots. Counseling Your health care provider may ask you questions about:  Alcohol, tobacco, and drug use.  Emotional well-being.  Home and relationship well-being.  Sexual activity.  Eating habits.  History of falls.  Memory and ability to understand (cognition).  Work and work Statistician.  Pregnancy and menstrual history. What immunizations do I need?  Influenza (flu) vaccine  This is recommended every year. Tetanus, diphtheria, and pertussis (Tdap) vaccine  You may need a Td booster every 10 years. Varicella (chickenpox) vaccine  You may need this vaccine if you have not already been vaccinated. Zoster (shingles) vaccine  You may need this after age 72. Pneumococcal conjugate (PCV13) vaccine  One dose is recommended after age 72. Pneumococcal polysaccharide (PPSV23) vaccine  One dose is recommended after age 72. Measles, mumps, and rubella (MMR) vaccine  You may need at least one dose of MMR if you were born in 1957 or later. You may also need a second dose. Meningococcal conjugate (MenACWY) vaccine  You may need this if you have certain conditions. Hepatitis A vaccine  You may need this if you have certain conditions or if you travel or work in places where you may be exposed  to hepatitis A. Hepatitis B vaccine  You may need this if you have certain conditions or if you travel or work in places where you may be exposed to hepatitis B. Haemophilus influenzae type b (Hib) vaccine  You may need this if you have certain conditions. You may receive vaccines as individual doses or as more than one vaccine together in one shot (combination vaccines). Talk with your health care provider about the risks and benefits of combination vaccines. What tests do I need? Blood tests  Lipid and cholesterol levels. These may be checked every 5 years, or more frequently depending on your overall health.  Hepatitis C test.  Hepatitis B test. Screening  Lung cancer screening. You may have this screening every year starting at age 72 if you have a 30-pack-year history of smoking and currently smoke or have quit within the past 15 years.  Colorectal cancer screening. All adults should have this screening starting at age 72 and continuing until age 15. Your health care provider may recommend screening at age 72 if you are at increased risk. You will have tests every 1-10 years, depending on your results and the type of screening test.  Diabetes screening. This is done by checking your blood sugar (glucose) after you have not eaten for a while (fasting). You may have this done every 1-3 years.  Mammogram. This may be done every 1-2 years. Talk with your health care provider about how often you should have regular mammograms.  BRCA-related cancer screening. This may be done if you have a family history of breast, ovarian, tubal, or peritoneal cancers.  Other tests  Sexually transmitted disease (STD) testing.  Bone density scan. This is done to screen for osteoporosis. You may have this done starting at age 72. Follow these instructions at home: Eating and drinking  Eat a diet that includes fresh fruits and vegetables, whole grains, lean protein, and low-fat dairy products. Limit  your intake of foods with high amounts of sugar, saturated fats, and salt.  Take vitamin and mineral supplements as recommended by your health care provider.  Do not drink alcohol if your health care provider tells you not to drink.  If you drink alcohol: ? Limit how much you have to 0-1 drink a day. ? Be aware of how much alcohol is in your drink. In the [place] one drink equals one 12 oz bottle of beer (355 mL), one 5 oz glass of wine (148 mL), or one 1 oz glass of hard liquor (44 mL). Lifestyle  Take daily care of your teeth and gums.  Stay active. Exercise for at least 30 minutes on 5 or more days each week.  Do not use any products that contain nicotine or tobacco, such as cigarettes, e-cigarettes, and chewing tobacco. If you need help quitting, ask your health care provider.  If you are sexually active, practice safe sex. Use a condom or other form of protection in order to prevent STIs (sexually transmitted infections).  Talk with your health care provider about taking a low-dose aspirin or statin. What's next?  Go to your health care provider once a year for a well check visit.  Ask your health care provider how often you should have your eyes and teeth checked.  Stay up to date on all vaccines. This information is not intended to replace advice given to you by your health care provider. Make sure you discuss any questions you have with your health care provider. Document Revised: 03/19/2018 Document Reviewed: 03/19/2018 Elsevier Patient Education  2020 [employer].

## 2019-12-28 LAB — COMPREHENSIVE METABOLIC PANEL
AG Ratio: 1.5 (calc) (ref 1.0–2.5)
ALT: 17 U/L (ref 6–29)
AST: 14 U/L (ref 10–35)
Albumin: 3.7 g/dL (ref 3.6–5.1)
Alkaline phosphatase (APISO): 60 U/L (ref 37–153)
BUN: 16 mg/dL (ref 7–25)
CO2: 30 mmol/L (ref 20–32)
Calcium: 8.8 mg/dL (ref 8.6–10.4)
Chloride: 103 mmol/L (ref 98–110)
Creat: 0.66 mg/dL (ref 0.60–0.93)
Globulin: 2.4 g/dL (calc) (ref 1.9–3.7)
Glucose, Bld: 155 mg/dL — ABNORMAL HIGH (ref 65–99)
Potassium: 3.9 mmol/L (ref 3.5–5.3)
Sodium: 140 mmol/L (ref 135–146)
Total Bilirubin: 0.2 mg/dL (ref 0.2–1.2)
Total Protein: 6.1 g/dL (ref 6.1–8.1)

## 2019-12-28 LAB — LIPID PANEL
Cholesterol: 156 mg/dL (ref <200)
HDL: 63 mg/dL (ref >=50)
LDL Cholesterol (Calc): 70 mg/dL (calc)
Non-HDL Cholesterol (Calc): 93 mg/dL (calc) (ref <130)
Total CHOL/HDL Ratio: 2.5 (calc) (ref <5.0)
Triglycerides: 143 mg/dL (ref <150)

## 2019-12-28 LAB — HEMOGLOBIN A1C
Hgb A1c MFr Bld: 6.4 % of total Hgb — ABNORMAL HIGH (ref <5.7)
Mean Plasma Glucose: 137 (calc)
eAG (mmol/L): 7.6 (calc)

## 2020-01-26 ENCOUNTER — Ambulatory Visit: Payer: Medicare Other | Admitting: Gastroenterology

## 2020-01-31 ENCOUNTER — Telehealth: Payer: Self-pay | Admitting: Family Medicine

## 2020-01-31 DIAGNOSIS — E1110 Type 2 diabetes mellitus with ketoacidosis without coma: Secondary | ICD-10-CM

## 2020-01-31 NOTE — Telephone Encounter (Signed)
New pharmacy  Medication: sitaGLIPtin-metformin (JANUMET) 50-1000 MG tablet [[id number]]      Has the patient contacted their pharmacy? (If no, request that the patient contact the pharmacy for the refill.) (If yes, when and what did the pharmacy advise?)     Preferred Pharmacy (with phone number or street name):  [place], [place] Phone:  [phone]  Fax:  [phone]          Agent: Please be advised that RX refills may take up to 3 business days. We ask that you follow-up with your pharmacy.

## 2020-02-01 ENCOUNTER — Encounter: Payer: Self-pay | Admitting: Family Medicine

## 2020-02-01 ENCOUNTER — Telehealth: Payer: Medicare Other

## 2020-02-01 MED ORDER — JANUMET 50-1000 MG PO TABS: ORAL_TABLET | ORAL | 3 refills | Status: DC

## 2020-02-01 NOTE — Telephone Encounter (Signed)
Refill sent.
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
## 2020-02-11 ENCOUNTER — Telehealth: Payer: Self-pay

## 2020-02-11 DIAGNOSIS — F411 Generalized anxiety disorder: Secondary | ICD-10-CM

## 2020-02-11 MED ORDER — ESCITALOPRAM OXALATE 10 MG PO TABS: 10.0000 mg | ORAL_TABLET | Freq: Every day | ORAL | 0 refills | Status: DC

## 2020-02-11 NOTE — Telephone Encounter (Signed)
Patient called stating she needs a refill on escitalopram 10 mg tab to [facility].  She will be out of medication by Sunday.  Going forward, this and all other maintenance medications will need to be sent to [facility] order pharmacy.  Phone number [phone].

## 2020-02-11 NOTE — Telephone Encounter (Signed)
Refill sent.

## 2020-02-23 DIAGNOSIS — E0841 Diabetes mellitus due to underlying condition with diabetic mononeuropathy: Secondary | ICD-10-CM | POA: Diagnosis not present

## 2020-02-23 DIAGNOSIS — N993 Prolapse of vaginal vault after hysterectomy: Secondary | ICD-10-CM | POA: Diagnosis not present

## 2020-02-23 DIAGNOSIS — Z4689 Encounter for fitting and adjustment of other specified devices: Secondary | ICD-10-CM | POA: Diagnosis not present

## 2020-02-23 DIAGNOSIS — N3946 Mixed incontinence: Secondary | ICD-10-CM | POA: Diagnosis not present

## 2020-02-29 DIAGNOSIS — N814 Uterovaginal prolapse, unspecified: Secondary | ICD-10-CM | POA: Diagnosis not present

## 2020-02-29 DIAGNOSIS — G629 Polyneuropathy, unspecified: Secondary | ICD-10-CM | POA: Diagnosis not present

## 2020-02-29 DIAGNOSIS — N3946 Mixed incontinence: Secondary | ICD-10-CM | POA: Diagnosis not present

## 2020-02-29 DIAGNOSIS — N7689 Other specified inflammation of vagina and vulva: Secondary | ICD-10-CM | POA: Diagnosis not present

## 2020-02-29 DIAGNOSIS — E119 Type 2 diabetes mellitus without complications: Secondary | ICD-10-CM | POA: Diagnosis not present

## 2020-02-29 DIAGNOSIS — Z4689 Encounter for fitting and adjustment of other specified devices: Secondary | ICD-10-CM | POA: Diagnosis not present

## 2020-03-06 DIAGNOSIS — G4700 Insomnia, unspecified: Secondary | ICD-10-CM | POA: Diagnosis not present

## 2020-03-06 DIAGNOSIS — G894 Chronic pain syndrome: Secondary | ICD-10-CM | POA: Diagnosis not present

## 2020-03-06 DIAGNOSIS — E1142 Type 2 diabetes mellitus with diabetic polyneuropathy: Secondary | ICD-10-CM | POA: Diagnosis not present

## 2020-03-06 DIAGNOSIS — Z79891 Long term (current) use of opiate analgesic: Secondary | ICD-10-CM | POA: Diagnosis not present

## 2020-03-20 ENCOUNTER — Telehealth: Payer: Self-pay | Admitting: Pharmacist
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
## 2020-03-29 ENCOUNTER — Telehealth: Payer: Self-pay | Admitting: Family Medicine

## 2020-03-29 NOTE — Telephone Encounter (Signed)
Patient would like a [facility] She is changing pharmacies

## 2020-04-05 ENCOUNTER — Telehealth: Payer: Self-pay | Admitting: Family Medicine

## 2020-04-05 DIAGNOSIS — I10 Essential (primary) hypertension: Secondary | ICD-10-CM

## 2020-04-05 DIAGNOSIS — E785 Hyperlipidemia, unspecified: Secondary | ICD-10-CM

## 2020-04-05 DIAGNOSIS — F411 Generalized anxiety disorder: Secondary | ICD-10-CM

## 2020-04-05 DIAGNOSIS — Z780 Asymptomatic menopausal state: Secondary | ICD-10-CM

## 2020-04-05 DIAGNOSIS — R609 Edema, unspecified: Secondary | ICD-10-CM

## 2020-04-05 DIAGNOSIS — E1110 Type 2 diabetes mellitus with ketoacidosis without coma: Secondary | ICD-10-CM

## 2020-04-05 DIAGNOSIS — E1169 Type 2 diabetes mellitus with other specified complication: Secondary | ICD-10-CM

## 2020-04-05 DIAGNOSIS — K219 Gastro-esophageal reflux disease without esophagitis: Secondary | ICD-10-CM

## 2020-04-05 MED ORDER — OMEPRAZOLE 40 MG PO CPDR: 40.0000 mg | DELAYED_RELEASE_CAPSULE | Freq: Every day | ORAL | 3 refills | Status: DC

## 2020-04-05 MED ORDER — ATORVASTATIN CALCIUM 10 MG PO TABS: 10.0000 mg | ORAL_TABLET | Freq: Every day | ORAL | 1 refills | Status: DC

## 2020-04-05 MED ORDER — TIZANIDINE HCL 4 MG PO TABS: 4.0000 mg | ORAL_TABLET | Freq: Three times a day (TID) | ORAL | 0 refills | Status: DC | PRN

## 2020-04-05 MED ORDER — FUROSEMIDE 20 MG PO TABS: 20.0000 mg | ORAL_TABLET | Freq: Every day | ORAL | 1 refills | Status: DC

## 2020-04-05 MED ORDER — AMLODIPINE BESYLATE 5 MG PO TABS: 5.0000 mg | ORAL_TABLET | Freq: Every day | ORAL | 1 refills | Status: DC

## 2020-04-05 MED ORDER — KLOR-CON M20 20 MEQ PO TBCR: EXTENDED_RELEASE_TABLET | ORAL | 1 refills | Status: DC

## 2020-04-05 MED ORDER — ESCITALOPRAM OXALATE 10 MG PO TABS: 10.0000 mg | ORAL_TABLET | Freq: Every day | ORAL | 1 refills | Status: DC

## 2020-04-05 MED ORDER — JANUMET 50-1000 MG PO TABS: 2.0000 | ORAL_TABLET | Freq: Every day | ORAL | 1 refills | Status: DC

## 2020-04-05 MED ORDER — ESTRADIOL 1 MG PO TABS: 1.0000 mg | ORAL_TABLET | Freq: Every day | ORAL | 3 refills | Status: DC

## 2020-04-05 MED ORDER — BISOPROLOL-HYDROCHLOROTHIAZIDE 10-6.25 MG PO TABS: 1.0000 | ORAL_TABLET | Freq: Every day | ORAL | 1 refills | Status: DC

## 2020-04-05 NOTE — Telephone Encounter (Signed)
Refills sent

## 2020-04-05 NOTE — Telephone Encounter (Signed)
Medication:  amLODipine (NORVASC) 5 MG tablet [[id number]]   atorvastatin (LIPITOR) 10 MG tablet [[id number]]   escitalopram (LEXAPRO) 10 MG tablet [[id number]]   estradiol (ESTRACE) 1 MG tablet [[id number]]   furosemide (LASIX) 20 MG tablet [[id number]]   KLOR-CON M20 20 MEQ tablet [[id number]]   tiZANidine (ZANAFLEX) 4 MG tablet [[id number]]   omeprazole (PRILOSEC) 40 MG capsule [[id number]]   sitaGLIPtin-metformin (JANUMET) 50-1000 MG tablet [[id number]]  bisoprolol-hydrochlorothiazide (ZIAC) 10-6.25 MG tablet [[id number]]    Has the patient contacted their pharmacy?  (If no, request that the patient contact the pharmacy for the refill.) (If yes, when and what did the pharmacy advise?)     Preferred Pharmacy (with phone number or street name): [facility] - [place], [place] - [address] [place], [address]  [address] [place], [address], [place] [address]  Phone:  [phone] Fax:  [phone]     Agent: Please be advised that RX refills may take up to 3 business days. We ask that you follow-up with your pharmacy.

## 2020-04-10 NOTE — Telephone Encounter (Signed)
Patient following up on prescriptions

## 2020-04-10 NOTE — Telephone Encounter (Signed)
Patient following up.

## 2020-04-12 ENCOUNTER — Telehealth: Payer: Self-pay | Admitting: Family Medicine

## 2020-04-12 DIAGNOSIS — I10 Essential (primary) hypertension: Secondary | ICD-10-CM

## 2020-04-12 MED ORDER — BISOPROLOL-HYDROCHLOROTHIAZIDE 10-6.25 MG PO TABS: 1.0000 | ORAL_TABLET | Freq: Every day | ORAL | 0 refills | Status: DC

## 2020-04-12 NOTE — Telephone Encounter (Signed)
Rx sent 

## 2020-04-12 NOTE — Telephone Encounter (Signed)
[facility]. She needs a prescription in the mean while send to   bisoprolol-hydrochlorothiazide [facility]) 10-6.25 MG tablet [[id number]]   [facility] [address] [place] - [address]  [address] [place], [place] [address]  Phone:  [phone] Fax:  [phone]

## 2020-04-12 NOTE — Telephone Encounter (Signed)
Medication was sent to the wrong pharmacy  Please re-send to   [facility] - [place], [place] - [address] AT [facility] Phone:  [phone]  Fax:  [phone]

## 2020-04-13 MED ORDER — AMLODIPINE BESYLATE 5 MG PO TABS: 5.0000 mg | ORAL_TABLET | Freq: Every day | ORAL | 1 refills | Status: DC

## 2020-04-13 MED ORDER — ATORVASTATIN CALCIUM 10 MG PO TABS: 10.0000 mg | ORAL_TABLET | Freq: Every day | ORAL | 1 refills | Status: DC

## 2020-04-13 MED ORDER — ESTRADIOL 1 MG PO TABS: 1.0000 mg | ORAL_TABLET | Freq: Every day | ORAL | 3 refills | Status: DC

## 2020-04-13 MED ORDER — FUROSEMIDE 20 MG PO TABS: 20.0000 mg | ORAL_TABLET | Freq: Every day | ORAL | 1 refills | Status: DC

## 2020-04-13 MED ORDER — JANUMET 50-1000 MG PO TABS: 2.0000 | ORAL_TABLET | Freq: Every day | ORAL | 1 refills | Status: DC

## 2020-04-13 MED ORDER — OMEPRAZOLE 40 MG PO CPDR: 40.0000 mg | DELAYED_RELEASE_CAPSULE | Freq: Every day | ORAL | 3 refills | Status: DC

## 2020-04-13 MED ORDER — ESCITALOPRAM OXALATE 10 MG PO TABS: 10.0000 mg | ORAL_TABLET | Freq: Every day | ORAL | 1 refills | Status: DC

## 2020-04-13 MED ORDER — KLOR-CON M20 20 MEQ PO TBCR: EXTENDED_RELEASE_TABLET | ORAL | 1 refills | Status: DC

## 2020-04-13 NOTE — Addendum Note (Signed)
Addended byConrad Marshallton D on: 04/13/2020 12:19 PM   Modules accepted: Orders

## 2020-04-13 NOTE — Telephone Encounter (Signed)
Rxs [facility] to [facility] 

## 2020-05-22 ENCOUNTER — Telehealth: Payer: Self-pay | Admitting: Pharmacist

## 2020-05-22 NOTE — Progress Notes (Addendum)
Chronic Care Management Pharmacy Assistant   Name: Brittany [name]  MRN: [id number] DOB: Jul 16, 1945  Reason for Encounter: General Disease State Call  Patient Questions:  1.  Have you seen any other providers since your last visit? No.   2.  Any changes in your medicines or health? No.   PCP : Ann Held, DO   Their chronic conditions include: Diabetes, Hypertension, Hyperlipidemia, Depression, Neuropathy/Pain, Muscle Spasm, GERD, Estrogen Deficiency.  Office Visits: None since 03/20/20  Consults: None since 03/20/20   Allergies:   Allergies  Allergen Reactions   Sulfonamide Derivatives Hives    Medications: Outpatient Encounter Medications as of 05/22/2020  Medication Sig   amLODipine (NORVASC) 5 MG tablet Take 1 tablet (5 mg total) by mouth daily.   aspirin EC 81 MG tablet Take 1 tablet (81 mg total) by mouth 2 (two) times daily. (Patient taking differently: Take 81 mg by mouth at bedtime. )   atorvastatin (LIPITOR) 10 MG tablet Take 1 tablet (10 mg total) by mouth daily.   bisoprolol-hydrochlorothiazide (ZIAC) 10-6.25 MG tablet Take 1 tablet by mouth daily.   Blood Glucose Monitoring Suppl (ONE TOUCH ULTRA MINI) w/Device KIT Use as directed once a day.  Dx Code:  E11.42   calcium carbonate (TUMS - DOSED IN MG ELEMENTAL CALCIUM) 500 MG chewable tablet Chew 1 tablet by mouth as needed for indigestion or heartburn.   docusate sodium (COLACE) 100 MG capsule Take 100 mg by mouth 2 (two) times daily.   escitalopram (LEXAPRO) 10 MG tablet Take 1 tablet (10 mg total) by mouth daily.   estradiol (ESTRACE) 1 MG tablet Take 1 tablet (1 mg total) by mouth daily.   ferrous sulfate 325 (65 FE) MG tablet Take 325 mg by mouth daily with breakfast.   folic acid (FOLVITE) 500 MCG tablet Take 800 mcg by mouth daily.    furosemide (LASIX) 20 MG tablet Take 1 tablet (20 mg total) by mouth daily.   gabapentin (NEURONTIN) 800 MG tablet Take 1 tablet (800 mg total) by mouth 4 (four)  times daily.   glucose blood (ONE TOUCH ULTRA TEST) test strip Check Blood sugar once daily   KLOR-CON M20 20 MEQ tablet Take 1 tablet by mouth once a day.   methocarbamol (ROBAXIN) 750 MG tablet Take 1 tablet (750 mg total) by mouth 2 (two) times daily as needed for muscle spasms.   morphine (MS CONTIN) 30 MG 12 hr tablet Take 30 mg by mouth every 12 (twelve) hours. Per Dr. Nicholaus [place] with Pain Mgmt   Multiple Vitamins-Minerals (MULTIVITAMIN GUMMIES WOMENS PO) Take 1 tablet by mouth daily.    mupirocin ointment (BACTROBAN) 2 % Apply 1 application topically daily. Apply to the affected area 2 times a day   omeprazole (PRILOSEC) 40 MG capsule Take 1 capsule (40 mg total) by mouth daily.   OneTouch Delica Lancets 93G MISC Check blood sugar once daily   sitaGLIPtin-metformin (JANUMET) 50-1000 MG tablet Take 2 tablets by mouth at bedtime.   Specialty Vitamins Products (BIOTIN PLUS KERATIN) 10000-100 MCG-MG TABS Take 1 tablet by mouth daily.   tiZANidine (ZANAFLEX) 4 MG tablet Take 1 tablet (4 mg total) by mouth 3 (three) times daily as needed for muscle spasms.   No facility-administered encounter medications on file as of 05/22/2020.    Current Diagnosis: Patient Active Problem List   Diagnosis Date Noted   Abdominal bloating 10/26/2019   Ulcer of toe of left foot, limited to breakdown of  skin ([place]) 07/13/2019   Status post total replacement of left hip 11/16/2018   Vaginal prolapse 09/30/2018   Primary osteoarthritis of left hip 06/10/2018   Osteomyelitis of second toe of right foot ([place]) 04/24/2018   Osteomyelitis of right foot ([place]) 04/21/2018   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 ([place]) 04/17/2018   Callus 01/13/2018   Generalized anxiety disorder 10/16/2017   Menopause 10/16/2017   DM (diabetes mellitus) type II uncontrolled, periph vascular disorder ([place]) 10/16/2017   Hyperlipidemia associated with type 2 diabetes mellitus ([place]) 10/16/2017   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 ([place]) 10/14/2017   Osteomyelitis of great toe of right foot ([place]) 09/25/2017   Pain in right foot 09/16/2017   Cellulitis 08/08/2017   Hypoglycemia without diagnosis of diabetes mellitus 08/08/2017   Peripheral neuropathy 08/08/2017   Abscess 08/08/2017   Hyperlipidemia LDL goal <70 09/22/2016   Tooth pain 09/03/2012   Edema 09/03/2012   Supraclavicular fossa fullness 06/18/2012   Hair loss 09/18/2010   Fatigue 09/18/2010   DYSPEPSIA&OTHER SPEC DISORDERS FUNCTION STOMACH 05/08/2010   DIARRHEA 05/08/2010   DIZZINESS 04/19/2010   OTHER ACUTE SINUSITIS 02/28/2010   NAUSEA 02/28/2010   Pain in right ankle and joints of right foot 02/15/2010   URI 07/09/2007   GERD 02/12/2007   LOW BACK PAIN 02/12/2007   Hypertension 09/03/2006   HOT FLASHES 09/03/2006   INSOMNIA 09/03/2006    Goals Addressed   None     Patient stated she does some walking every other day/once in a while but not for long. Patient stated she mainly eats baked chicken and fish. She stated she loves vegetables. Patient stated usually only eats breakfast and dinner and snacks in between the two times. She stated she drinks about 8-9 cups of water per day. Patient stated she uses [facility] and [place] [facility] for medications that she may need right away. Patient stated her pain medication doesn't take the pain away completely, it only takes the edge off . Patient stated she knows she is supposed to take her blood pressure and blood sugar but hasn't done it lately. I encouraged her to start back checking her blood pressure and blood sugar. She stated both machines work and she agreed to start checking them both and writing them down. Asked her if she had any questions or concerns about her medications or health at this time and she stated no not at this time. Overall she stated she is doing well.    Follow-Up:  Pharmacist Review   Charlann Lange, RMA Clinical  Pharmacist Assistant [phone]  10 minutes spent in review, coordination, and documentation.  Reviewed by: Beverly Milch, PharmD Clinical Pharmacist [facility] [phone]

## 2020-06-13 DIAGNOSIS — N3942 Incontinence without sensory awareness: Secondary | ICD-10-CM | POA: Diagnosis not present

## 2020-06-13 DIAGNOSIS — N3946 Mixed incontinence: Secondary | ICD-10-CM | POA: Diagnosis not present

## 2020-06-13 DIAGNOSIS — N3944 Nocturnal enuresis: Secondary | ICD-10-CM | POA: Diagnosis not present

## 2020-06-13 DIAGNOSIS — R350 Frequency of micturition: Secondary | ICD-10-CM | POA: Diagnosis not present

## 2020-06-21 DIAGNOSIS — N281 Cyst of kidney, acquired: Secondary | ICD-10-CM | POA: Diagnosis not present

## 2020-06-21 DIAGNOSIS — N3946 Mixed incontinence: Secondary | ICD-10-CM | POA: Diagnosis not present

## 2020-06-21 DIAGNOSIS — N993 Prolapse of vaginal vault after hysterectomy: Secondary | ICD-10-CM | POA: Diagnosis not present

## 2020-06-21 DIAGNOSIS — R338 Other retention of urine: Secondary | ICD-10-CM | POA: Diagnosis not present

## 2020-06-21 DIAGNOSIS — N1339 Other hydronephrosis: Secondary | ICD-10-CM | POA: Diagnosis not present

## 2020-06-21 DIAGNOSIS — N765 Ulceration of vagina: Secondary | ICD-10-CM | POA: Diagnosis not present

## 2020-06-27 ENCOUNTER — Ambulatory Visit: Payer: Medicare Other | Admitting: Family Medicine

## 2020-07-05 DIAGNOSIS — Z01818 Encounter for other preprocedural examination: Secondary | ICD-10-CM | POA: Diagnosis not present

## 2020-07-05 DIAGNOSIS — R8290 Unspecified abnormal findings in urine: Secondary | ICD-10-CM | POA: Diagnosis not present

## 2020-07-11 DIAGNOSIS — Z79891 Long term (current) use of opiate analgesic: Secondary | ICD-10-CM | POA: Diagnosis not present

## 2020-07-11 DIAGNOSIS — E1142 Type 2 diabetes mellitus with diabetic polyneuropathy: Secondary | ICD-10-CM | POA: Diagnosis not present

## 2020-07-11 DIAGNOSIS — G894 Chronic pain syndrome: Secondary | ICD-10-CM | POA: Diagnosis not present

## 2020-07-11 DIAGNOSIS — G4700 Insomnia, unspecified: Secondary | ICD-10-CM | POA: Diagnosis not present

## 2020-07-13 DIAGNOSIS — N3946 Mixed incontinence: Secondary | ICD-10-CM | POA: Diagnosis not present

## 2020-07-13 DIAGNOSIS — K219 Gastro-esophageal reflux disease without esophagitis: Secondary | ICD-10-CM | POA: Diagnosis not present

## 2020-07-13 DIAGNOSIS — N993 Prolapse of vaginal vault after hysterectomy: Secondary | ICD-10-CM | POA: Diagnosis not present

## 2020-07-13 DIAGNOSIS — I10 Essential (primary) hypertension: Secondary | ICD-10-CM | POA: Diagnosis not present

## 2020-07-13 DIAGNOSIS — N7689 Other specified inflammation of vagina and vulva: Secondary | ICD-10-CM | POA: Diagnosis not present

## 2020-07-13 DIAGNOSIS — E1151 Type 2 diabetes mellitus with diabetic peripheral angiopathy without gangrene: Secondary | ICD-10-CM | POA: Diagnosis not present

## 2020-07-13 DIAGNOSIS — N765 Ulceration of vagina: Secondary | ICD-10-CM | POA: Diagnosis not present

## 2020-07-13 DIAGNOSIS — E785 Hyperlipidemia, unspecified: Secondary | ICD-10-CM | POA: Diagnosis not present

## 2020-07-13 DIAGNOSIS — R338 Other retention of urine: Secondary | ICD-10-CM | POA: Diagnosis not present

## 2020-07-13 DIAGNOSIS — R69 Illness, unspecified: Secondary | ICD-10-CM | POA: Diagnosis not present

## 2020-07-13 DIAGNOSIS — R339 Retention of urine, unspecified: Secondary | ICD-10-CM | POA: Diagnosis not present

## 2020-07-13 DIAGNOSIS — E1165 Type 2 diabetes mellitus with hyperglycemia: Secondary | ICD-10-CM | POA: Diagnosis not present

## 2020-07-13 HISTORY — PX: BLADDER SUSPENSION: SHX72

## 2020-07-14 DIAGNOSIS — N3946 Mixed incontinence: Secondary | ICD-10-CM | POA: Diagnosis not present

## 2020-07-14 DIAGNOSIS — F411 Generalized anxiety disorder: Secondary | ICD-10-CM | POA: Diagnosis not present

## 2020-07-14 DIAGNOSIS — E1165 Type 2 diabetes mellitus with hyperglycemia: Secondary | ICD-10-CM | POA: Diagnosis not present

## 2020-07-14 DIAGNOSIS — K219 Gastro-esophageal reflux disease without esophagitis: Secondary | ICD-10-CM | POA: Diagnosis not present

## 2020-07-14 DIAGNOSIS — R69 Illness, unspecified: Secondary | ICD-10-CM | POA: Diagnosis not present

## 2020-07-14 DIAGNOSIS — E785 Hyperlipidemia, unspecified: Secondary | ICD-10-CM | POA: Diagnosis not present

## 2020-07-14 DIAGNOSIS — N993 Prolapse of vaginal vault after hysterectomy: Secondary | ICD-10-CM | POA: Diagnosis not present

## 2020-07-14 DIAGNOSIS — N765 Ulceration of vagina: Secondary | ICD-10-CM | POA: Diagnosis not present

## 2020-07-14 DIAGNOSIS — I10 Essential (primary) hypertension: Secondary | ICD-10-CM | POA: Diagnosis not present

## 2020-07-14 DIAGNOSIS — R339 Retention of urine, unspecified: Secondary | ICD-10-CM | POA: Diagnosis not present

## 2020-07-14 DIAGNOSIS — E1151 Type 2 diabetes mellitus with diabetic peripheral angiopathy without gangrene: Secondary | ICD-10-CM | POA: Diagnosis not present

## 2020-07-17 DIAGNOSIS — Z466 Encounter for fitting and adjustment of urinary device: Secondary | ICD-10-CM | POA: Diagnosis not present

## 2020-07-17 DIAGNOSIS — R32 Unspecified urinary incontinence: Secondary | ICD-10-CM | POA: Diagnosis not present

## 2020-07-17 DIAGNOSIS — R159 Full incontinence of feces: Secondary | ICD-10-CM | POA: Diagnosis not present

## 2020-07-20 ENCOUNTER — Telehealth: Payer: Self-pay | Admitting: Family Medicine

## 2020-07-20 NOTE — Telephone Encounter (Signed)
Pt got a blister on her left foot, pt is a diabetic and wants to make sure she is caring for it correctly. States her friend cleaned it with warm soap and water and then used peroxide. Pt would like to know if there is anything else she need to do to ensue she is properly taking care of it.

## 2020-07-21 ENCOUNTER — Encounter (HOSPITAL_BASED_OUTPATIENT_CLINIC_OR_DEPARTMENT_OTHER): Payer: Medicare HMO | Admitting: Internal Medicine

## 2020-07-21 ENCOUNTER — Telehealth: Payer: Self-pay | Admitting: Surgical

## 2020-07-21 NOTE — Telephone Encounter (Signed)
Patient called on-call office number.  She was concerned about developing ulceration that she noticed yesterday on the plantar aspect of her forefoot near the base of the great toe.  She notes that ulceration is about the size of a quarter.  Denies any drainage at all.  Denies any fevers, chills, night sweats, malaise.  Around the border of the ulcer there is no erythema and she states that it looks just like normal skin.  She is concerned about infection.  Recommended patient send a picture to my cell phone.  Recommended she apply antibacterial ointment and use a dry dressing to protect the ulceration and follow-up with Dr. Sharol Given early next week.  Prompted her to go to the ED if she develops any signs of infection that were reviewed with her.

## 2020-07-25 ENCOUNTER — Encounter: Payer: Self-pay | Admitting: Orthopedic Surgery

## 2020-07-25 ENCOUNTER — Ambulatory Visit (INDEPENDENT_AMBULATORY_CARE_PROVIDER_SITE_OTHER): Payer: Medicare HMO | Admitting: Physician Assistant

## 2020-07-25 DIAGNOSIS — L97521 Non-pressure chronic ulcer of other part of left foot limited to breakdown of skin: Secondary | ICD-10-CM

## 2020-07-25 NOTE — Progress Notes (Signed)
Office Visit Note   Patient: Brittany [name]           Date of Birth: 1945/12/18           MRN: [id number] Visit Date: 07/25/2020              Requested by: [address], [place], [place] [address] [place],  [place] [address] PCP: Carollee Herter, Alferd Apa, DO  Chief Complaint  Patient presents with  . Left Foot - Wound Check      HPI: Patient is a pleasant 75 year old woman comes in today for evaluation of an open blister on the left medial foot.  She noticed a thickened callus on the medial plantar surface of her foot.  Her and her partner been treating this with hydrogen peroxide full-strength.  She has a history of a transmetatarsal amputation on the right side was duly concerned  Assessment & Plan: Visit Diagnoses: No diagnosis found.  Plan: We will give her a postop shoe with a felt relieving pad.  They should cleanse daily with Dial soap and water.  I have counseled them against using hydrogen peroxide.  Follow-up in 2 weeks.  Leg currently no sign of infection  Follow-Up Instructions: No follow-ups on file.   Ortho Exam  Patient is alert, oriented, no adenopathy, well-dressed, normal affect, normal respiratory effort. She has an easily palpable strong dorsalis pedis pulse.  She has some medial planus collapse.  She has an area of callusing and a central small ulcer that measures half a centimeter wide by 2 cm long.  It only is about 2 mm deep.  There is minimal serous drainage no surrounding cellulitis no evidence of infection  Imaging: No results found. No images are attached to the encounter.  Labs: Lab Results  Component Value Date   HGBA1C 6.4 (H) 12/27/2019   HGBA1C 6.5 06/25/2019   HGBA1C 6.6 (H) 05/20/2019   REPTSTATUS 08/15/2017 FINAL 08/10/2017   GRAMSTAIN NO WBC SEEN NO ORGANISMS SEEN  08/10/2017   CULT  08/10/2017    No growth aerobically or anaerobically. Performed at [facility], [place] [address], [place], [place] [address]     Doy Hutching  03/11/2016    Three or more organisms present,each greater than 10,000 CFU/mL.These organisms,commonly found on external and internal genitalia,are considered to be colonizers.No further testing performed.      Lab Results  Component Value Date   ALBUMIN 3.8 10/26/2019   ALBUMIN 3.5 06/25/2019   ALBUMIN 4.0 05/20/2019    No results found for: MG No results found for: VD25OH  No results found for: PREALBUMIN CBC EXTENDED Latest Ref Rng & Units 10/26/2019 01/29/2019 11/18/2018  WBC 4.0 - 10.5 K/uL 9.4 10.6(H) 14.0(H)  RBC 3.87 - 5.11 Mil/uL 4.41 4.55 3.34(L)  HGB 12.0 - 15.0 g/dL 12.6 12.2 9.6(L)  HCT 36.0 - 46.0 % 37.2 39.6 28.8(L)  PLT 150.0 - 400.0 K/uL 237.0 244 164  NEUTROABS 1.4 - 7.7 K/uL 6.5 - -  LYMPHSABS 0.7 - 4.0 K/uL 2.0 - -     There is no height or weight on file to calculate BMI.  Orders:  No orders of the defined types were placed in this encounter.  No orders of the defined types were placed in this encounter.    Procedures: No procedures performed  Clinical Data: No additional findings.  ROS:  All other systems negative, except as noted in the HPI. Review of Systems  Objective: Vital Signs:  There were no vitals taken for this visit.  Specialty Comments:  No specialty comments available.  PMFS History: Patient Active Problem List   Diagnosis Date Noted  . Abdominal bloating 10/26/2019  . Ulcer of toe of left foot, limited to breakdown of skin ([place]) 07/13/2019  . 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 Vaginal prolapse 09/30/2018  . Primary osteoarthritis of left hip 06/10/2018  . Osteomyelitis of second toe of right foot ([place]) 04/24/2018  . Osteomyelitis of right foot ([place]) 04/21/2018  . 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 ([place]) 04/17/2018  . Callus 01/13/2018  . Generalized anxiety disorder 10/16/2017  . Menopause 10/16/2017  . DM (diabetes mellitus) type II uncontrolled, periph vascular disorder  ([place]) 10/16/2017  . Hyperlipidemia associated with type 2 diabetes mellitus ([place]) 10/16/2017  . 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 ([place]) 10/14/2017  . Osteomyelitis of great toe of right foot ([place]) 09/25/2017  . Pain in right foot 09/16/2017  . Cellulitis 08/08/2017  . Hypoglycemia without diagnosis of diabetes mellitus 08/08/2017  . Peripheral neuropathy 08/08/2017  . Abscess 08/08/2017  . Hyperlipidemia LDL goal <70 09/22/2016  . Tooth pain 09/03/2012  . Edema 09/03/2012  . Supraclavicular fossa fullness 06/18/2012  . Hair loss 09/18/2010  . Fatigue 09/18/2010  . DYSPEPSIA&OTHER [facility] DISORDERS FUNCTION STOMACH 05/08/2010  . DIARRHEA 05/08/2010  . DIZZINESS 04/19/2010  . OTHER ACUTE SINUSITIS 02/28/2010  . NAUSEA 02/28/2010  . Pain in right ankle and joints of right foot 02/15/2010  . URI 07/09/2007  . GERD 02/12/2007  . LOW BACK PAIN 02/12/2007  . Hypertension 09/03/2006  . HOT FLASHES 09/03/2006  . INSOMNIA 09/03/2006   Past Medical History:  Diagnosis Date  . Anemia    as a younger woman  . Arthritis    "all over my body" (10/01/2017)  . Chronic pain    In pain clinic   . Constipation   . Depression   . Diabetic peripheral neuropathy ([place])   . GERD (gastroesophageal reflux disease)   . High cholesterol   . History of hiatal hernia   . Hypertension   . Type II diabetes mellitus ([place]) dx'd ~ 2008    Family History  Problem Relation Age of Onset  . Cancer Mother        breast  . Depression Mother        bipolar  . Breast cancer Mother 53  . Heart disease Father 34       MI  . Diabetes Brother   . Kidney disease Brother   . Hypertension Brother   . Hyperlipidemia Brother   . Stroke Brother   . Coronary artery disease Other   . Diabetes Other     Past Surgical History:  Procedure Laterality Date  . AMPUTATION Right 09/29/2017   Procedure: AMPUTATION RIGHT 1ST RAY;  Surgeon: Leandrew Koyanagi, MD;  Location: [facility];  Service:  Orthopedics;  Laterality: Right;  . AMPUTATION Right 01/29/2019   Procedure: RIGHT TRANSMETATARSAL AMPUTATION;  Surgeon: Newt Minion, MD;  Location: [facility];  Service: Orthopedics;  Laterality: Right;  . AMPUTATION TOE Right 04/24/2018   Procedure: RIGHT 2ND TOE PROXIMAL INTERPHALANGEAL JOINT DISARTICULATION;  Surgeon: Leandrew Koyanagi, MD;  Location: [facility];  Service: Orthopedics;  Laterality: Right;  . APPLICATION OF WOUND VAC Right 09/29/2017   "foot"  . COLONOSCOPY    . I & D EXTREMITY Right 08/10/2017   Procedure: IRRIGATION AND  DEBRIDEMENT FOOT;  Surgeon: Leandrew Koyanagi, MD;  Location: [facility];  Service: Orthopedics;  Laterality: Right;  . [place]  . POSTERIOR LUMBAR FUSION  1996  . TOE SURGERY Right 1980s   bone removed "inbetween my toes"  . TOTAL HIP ARTHROPLASTY Left 11/16/2018   Procedure: LEFT TOTAL HIP ARTHROPLASTY ANTERIOR APPROACH;  Surgeon: Leandrew Koyanagi, MD;  Location: [facility];  Service: Orthopedics;  Laterality: Left;  [name] VAGINAL HYSTERECTOMY     Social History   Occupational History  . Not on file  Tobacco Use  . Smoking status: Never Smoker  . Smokeless tobacco: Never Used  Vaping Use  . Vaping Use: Never used  Substance and Sexual Activity  . Alcohol use: Never  . Drug use: Never  . Sexual activity: Not Currently    Partners: Male

## 2020-07-26 NOTE — Telephone Encounter (Signed)
Pt was seen by Ortho yesterday 07/25/2020.

## 2020-08-08 ENCOUNTER — Ambulatory Visit: Payer: Medicare HMO | Admitting: Orthopedic Surgery

## 2020-08-14 ENCOUNTER — Telehealth: Payer: Medicare Other | Admitting: Family Medicine

## 2020-08-14 ENCOUNTER — Other Ambulatory Visit: Payer: Self-pay

## 2020-08-16 ENCOUNTER — Telehealth: Payer: Medicare HMO | Admitting: Family Medicine

## 2020-08-17 ENCOUNTER — Ambulatory Visit (INDEPENDENT_AMBULATORY_CARE_PROVIDER_SITE_OTHER): Payer: Medicare Other | Admitting: Physician Assistant

## 2020-08-17 ENCOUNTER — Encounter: Payer: Self-pay | Admitting: Orthopedic Surgery

## 2020-08-17 DIAGNOSIS — L97521 Non-pressure chronic ulcer of other part of left foot limited to breakdown of skin: Secondary | ICD-10-CM

## 2020-08-17 NOTE — Progress Notes (Signed)
Office Visit Note   Patient: Brittany [name]           Date of Birth: January 17, 1946           MRN: [id number] Visit Date: 08/17/2020              Requested by: [address], [place], [place] [address] [place],  [place] [address] PCP: Carollee Herter, Alferd Apa, DO  Chief Complaint  Patient presents with  . Left Foot - Pain      HPI: Patient is in follow-up today for her left foot.  She has a plantar ulcer for which she has been wearing a postop shoe with a felt relief.  She has a history of a transmetatarsal amputation on the right and Charcot collapse on this left foot  Assessment & Plan: Visit Diagnoses: No diagnosis found.  Plan: New pad was made for her today.  She should be working on Achilles stretching as this is the cause of overload in this area of the foot.  If we cannot improve this we would recommend an Achilles release.  Follow-up in 4 weeks  Follow-Up Instructions: No follow-ups on file.   Ortho Exam  Patient is alert, oriented, no adenopathy, well-dressed, normal affect, normal respiratory effort. Examination demonstrates ulcer does not have any tunneling surrounding erythema or foul odor.  She has good healthy tissue at the base and does not probe deeply to  bone no ascending cellulitis.  She has palpable pulses at her ankle  Imaging: No results found. No images are attached to the encounter.  Labs: Lab Results  Component Value Date   HGBA1C 6.4 (H) 12/27/2019   HGBA1C 6.5 06/25/2019   HGBA1C 6.6 (H) 05/20/2019   REPTSTATUS 08/15/2017 FINAL 08/10/2017   GRAMSTAIN NO WBC SEEN NO ORGANISMS SEEN  08/10/2017   CULT  08/10/2017    No growth aerobically or anaerobically. Performed at [facility], [place] [address] [place], [place] [address]    Doy Hutching  03/11/2016    Three or more organisms present,each greater than 10,000 CFU/mL.These organisms,commonly found on external and internal genitalia,are considered to be colonizers.No further  testing performed.      Lab Results  Component Value Date   ALBUMIN 3.8 10/26/2019   ALBUMIN 3.5 06/25/2019   ALBUMIN 4.0 05/20/2019    No results found for: MG No results found for: VD25OH  No results found for: PREALBUMIN CBC EXTENDED Latest Ref Rng & Units 10/26/2019 01/29/2019 11/18/2018  WBC 4.0 - 10.5 K/uL 9.4 10.6(H) 14.0(H)  RBC 3.87 - 5.11 Mil/uL 4.41 4.55 3.34(L)  HGB 12.0 - 15.0 g/dL 12.6 12.2 9.6(L)  HCT 36.0 - 46.0 % 37.2 39.6 28.8(L)  PLT 150.0 - 400.0 K/uL 237.0 244 164  NEUTROABS 1.4 - 7.7 K/uL 6.5 - -  LYMPHSABS 0.7 - 4.0 K/uL 2.0 - -     There is no height or weight on file to calculate BMI.  Orders:  No orders of the defined types were placed in this encounter.  No orders of the defined types were placed in this encounter.    Procedures: No procedures performed  Clinical Data: No additional findings.  ROS:  All other systems negative, except as noted in the HPI. Review of Systems  Objective: Vital Signs: There were no vitals taken for this visit.  Specialty Comments:  No specialty comments available.  PMFS History: Patient Active Problem List   Diagnosis Date Noted  . Abdominal bloating  10/26/2019  . Ulcer of toe of left foot, limited to breakdown of skin ([place]) 07/13/2019  . 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 Vaginal prolapse 09/30/2018  . Primary osteoarthritis of left hip 06/10/2018  . Osteomyelitis of second toe of right foot ([place]) 04/24/2018  . Osteomyelitis of right foot ([place]) 04/21/2018  . 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 ([place]) 04/17/2018  . Callus 01/13/2018  . Generalized anxiety disorder 10/16/2017  . Menopause 10/16/2017  . DM (diabetes mellitus) type II uncontrolled, periph vascular disorder ([place]) 10/16/2017  . Hyperlipidemia associated with type 2 diabetes mellitus ([place]) 10/16/2017  . 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 ([place]) 10/14/2017  . Osteomyelitis of  great toe of right foot ([place]) 09/25/2017  . Pain in right foot 09/16/2017  . Cellulitis 08/08/2017  . Hypoglycemia without diagnosis of diabetes mellitus 08/08/2017  . Peripheral neuropathy 08/08/2017  . Abscess 08/08/2017  . Hyperlipidemia LDL goal <70 09/22/2016  . Tooth pain 09/03/2012  . Edema 09/03/2012  . Supraclavicular fossa fullness 06/18/2012  . Hair loss 09/18/2010  . Fatigue 09/18/2010  . DYSPEPSIA&OTHER [facility] DISORDERS FUNCTION STOMACH 05/08/2010  . DIARRHEA 05/08/2010  . DIZZINESS 04/19/2010  . OTHER ACUTE SINUSITIS 02/28/2010  . NAUSEA 02/28/2010  . Pain in right ankle and joints of right foot 02/15/2010  . URI 07/09/2007  . GERD 02/12/2007  . LOW BACK PAIN 02/12/2007  . Hypertension 09/03/2006  . HOT FLASHES 09/03/2006  . INSOMNIA 09/03/2006   Past Medical History:  Diagnosis Date  . Anemia    as a younger woman  . Arthritis    "all over my body" (10/01/2017)  . Chronic pain    In pain clinic   . Constipation   . Depression   . Diabetic peripheral neuropathy ([place])   . GERD (gastroesophageal reflux disease)   . High cholesterol   . History of hiatal hernia   . Hypertension   . Type II diabetes mellitus ([place]) dx'd ~ 2008    Family History  Problem Relation Age of Onset  . Cancer Mother        breast  . Depression Mother        bipolar  . Breast cancer Mother 73  . Heart disease Father 80       MI  . Diabetes Brother   . Kidney disease Brother   . Hypertension Brother   . Hyperlipidemia Brother   . Stroke Brother   . Coronary artery disease Other   . Diabetes Other     Past Surgical History:  Procedure Laterality Date  . AMPUTATION Right 09/29/2017   Procedure: AMPUTATION RIGHT 1ST RAY;  Surgeon: Leandrew Koyanagi, MD;  Location: [facility];  Service: Orthopedics;  Laterality: Right;  . AMPUTATION Right 01/29/2019   Procedure: RIGHT TRANSMETATARSAL AMPUTATION;  Surgeon: Newt Minion, MD;  Location: [facility];  Service: Orthopedics;  Laterality: Right;   . AMPUTATION TOE Right 04/24/2018   Procedure: RIGHT 2ND TOE PROXIMAL INTERPHALANGEAL JOINT DISARTICULATION;  Surgeon: Leandrew Koyanagi, MD;  Location: [facility];  Service: Orthopedics;  Laterality: Right;  . APPLICATION OF WOUND VAC Right 09/29/2017   "foot"  . COLONOSCOPY    . I & D EXTREMITY Right 08/10/2017   Procedure: IRRIGATION AND DEBRIDEMENT FOOT;  Surgeon: Leandrew Koyanagi, MD;  Location: [facility];  Service: Orthopedics;  Laterality: Right;  . [place]  . POSTERIOR LUMBAR FUSION  1996  .  TOE SURGERY Right 1980s   bone removed "inbetween my toes"  . TOTAL HIP ARTHROPLASTY Left 11/16/2018   Procedure: LEFT TOTAL HIP ARTHROPLASTY ANTERIOR APPROACH;  Surgeon: Leandrew Koyanagi, MD;  Location: [facility];  Service: Orthopedics;  Laterality: Left;  [name] VAGINAL HYSTERECTOMY     Social History   Occupational History  . Not on file  Tobacco Use  . Smoking status: Never Smoker  . Smokeless tobacco: Never Used  Vaping Use  . Vaping Use: Never used  Substance and Sexual Activity  . Alcohol use: Never  . Drug use: Never  . Sexual activity: Not Currently    Partners: Male

## 2020-08-24 ENCOUNTER — Other Ambulatory Visit: Payer: Self-pay

## 2020-08-24 ENCOUNTER — Other Ambulatory Visit: Payer: Self-pay | Admitting: Family Medicine

## 2020-08-24 ENCOUNTER — Encounter: Payer: Self-pay | Admitting: Family Medicine

## 2020-08-24 ENCOUNTER — Telehealth (INDEPENDENT_AMBULATORY_CARE_PROVIDER_SITE_OTHER): Payer: Medicare Other | Admitting: Family Medicine

## 2020-08-24 DIAGNOSIS — E1151 Type 2 diabetes mellitus with diabetic peripheral angiopathy without gangrene: Secondary | ICD-10-CM | POA: Diagnosis not present

## 2020-08-24 DIAGNOSIS — E1169 Type 2 diabetes mellitus with other specified complication: Secondary | ICD-10-CM

## 2020-08-24 DIAGNOSIS — E785 Hyperlipidemia, unspecified: Secondary | ICD-10-CM

## 2020-08-24 DIAGNOSIS — E1165 Type 2 diabetes mellitus with hyperglycemia: Secondary | ICD-10-CM | POA: Diagnosis not present

## 2020-08-24 DIAGNOSIS — IMO0002 Reserved for concepts with insufficient information to code with codable children: Secondary | ICD-10-CM

## 2020-08-24 DIAGNOSIS — I10 Essential (primary) hypertension: Secondary | ICD-10-CM | POA: Diagnosis not present

## 2020-08-24 MED ORDER — METHOCARBAMOL 750 MG PO TABS: 750.0000 mg | ORAL_TABLET | Freq: Two times a day (BID) | ORAL | 0 refills | Status: DC | PRN

## 2020-08-24 NOTE — Assessment & Plan Note (Signed)
Encouraged heart healthy diet, increase exercise, avoid trans fats, consider a krill oil cap daily 

## 2020-08-24 NOTE — Progress Notes (Signed)
MyChart Video Visit    Virtual Visit via Video Note   This visit type was conducted due to national recommendations for restrictions regarding the COVID-19 Pandemic (e.g. social distancing) in an effort to limit this patient's exposure and mitigate transmission in our community. This patient is at least at moderate risk for complications without adequate follow up. This format is felt to be most appropriate for this patient at this time. Physical exam was limited by quality of the video and audio technology used for the visit. Brittany [name] was able to get the patient set up on a video visit.  Patient location: Home Patient and provider in visit Provider location: Office  I discussed the limitations of evaluation and management by telemedicine and the availability of in person appointments. The patient expressed understanding and agreed to proceed.  Visit Date: 08/24/2020  Today's healthcare provider: Ann Held, DO     Subjective:    Patient ID: Brittany [name], female    DOB: 04/01/1946, 75 y.o.   MRN: [id number]    HPI Patient is in today for a telephone visit --- f/u bp , chol and dm.  She left her bp cuff and glucometer in [place] with her son.    Past Medical History:  Diagnosis Date  . Anemia    as a younger woman  . Arthritis    "all over my body" (10/01/2017)  . Chronic pain    In pain clinic   . Constipation   . Depression   . Diabetic peripheral neuropathy ([place])   . GERD (gastroesophageal reflux disease)   . High cholesterol   . History of hiatal hernia   . Hypertension   . Type II diabetes mellitus ([place]) dx'd ~ 2008    Past Surgical History:  Procedure Laterality Date  . AMPUTATION Right 09/29/2017   Procedure: AMPUTATION RIGHT 1ST RAY;  Surgeon: Leandrew Koyanagi, MD;  Location: [facility];  Service: Orthopedics;  Laterality: Right;  . AMPUTATION Right 01/29/2019   Procedure: RIGHT TRANSMETATARSAL AMPUTATION;  Surgeon: Newt Minion, MD;  Location:  [facility];  Service: Orthopedics;  Laterality: Right;  . AMPUTATION TOE Right 04/24/2018   Procedure: RIGHT 2ND TOE PROXIMAL INTERPHALANGEAL JOINT DISARTICULATION;  Surgeon: Leandrew Koyanagi, MD;  Location: [facility];  Service: Orthopedics;  Laterality: Right;  . APPLICATION OF WOUND VAC Right 09/29/2017   "foot"  . BLADDER SUSPENSION  07/13/2020   Dr Zigmund Daniel   . COLONOSCOPY    . I & D EXTREMITY Right 08/10/2017   Procedure: IRRIGATION AND DEBRIDEMENT FOOT;  Surgeon: Leandrew Koyanagi, MD;  Location: [facility];  Service: Orthopedics;  Laterality: Right;  . [place]  . POSTERIOR LUMBAR FUSION  1996  . TOE SURGERY Right 1980s   bone removed "inbetween my toes"  . TOTAL HIP ARTHROPLASTY Left 11/16/2018   Procedure: LEFT TOTAL HIP ARTHROPLASTY ANTERIOR APPROACH;  Surgeon: Leandrew Koyanagi, MD;  Location: [facility];  Service: Orthopedics;  Laterality: Left;  [name] VAGINAL HYSTERECTOMY      Family History  Problem Relation Age of Onset  . Cancer Mother        breast  . Depression Mother        bipolar  . Breast cancer Mother 38  . Heart disease Father 55       MI  . Diabetes Brother   . Kidney disease Brother   . Hypertension Brother   . Hyperlipidemia Brother   .  Stroke Brother   . Coronary artery disease Other   . Diabetes Other     Social History   Socioeconomic History  . Marital status: Widowed    Spouse name: Not on file  . Number of children: Not on file  . Years of education: Not on file  . Highest education level: Not on file  Occupational History  . Not on file  Tobacco Use  . Smoking status: Never Smoker  . Smokeless tobacco: Never Used  Vaping Use  . Vaping Use: Never used  Substance and Sexual Activity  . Alcohol use: Never  . Drug use: Never  . Sexual activity: Not Currently    Partners: Male  Other Topics Concern  . Not on file  Social History Narrative  . Not on file   Social Determinants of Health   Financial Resource Strain: Low Risk    . Difficulty of Paying Living Expenses: Not very hard  Food Insecurity: Not on file  Transportation Needs: Not on file  Physical Activity: Not on file  Stress: Not on file  Social Connections: Not on file  Intimate Partner Violence: Not on file    Outpatient Medications Prior to Visit  Medication Sig Dispense Refill  . amLODipine (NORVASC) 5 MG tablet Take 1 tablet (5 mg total) by mouth daily. 90 tablet 1  . aspirin EC 81 MG tablet Take 1 tablet (81 mg total) by mouth 2 (two) times daily. (Patient taking differently: Take 81 mg by mouth at bedtime. ) 84 tablet 0  . atorvastatin (LIPITOR) 10 MG tablet Take 1 tablet (10 mg total) by mouth daily. 90 tablet 1  . bisoprolol-hydrochlorothiazide (ZIAC) 10-6.25 MG tablet Take 1 tablet by mouth daily. 30 tablet 0  . Blood Glucose Monitoring Suppl (ONE TOUCH ULTRA MINI) w/Device KIT Use as directed once a day.  Dx Code:  E11.42 1 each 0  . calcium carbonate (TUMS - DOSED IN MG ELEMENTAL CALCIUM) 500 MG chewable tablet Chew 1 tablet by mouth as needed for indigestion or heartburn.    . docusate sodium (COLACE) 100 MG capsule Take 100 mg by mouth 2 (two) times daily.    [name] escitalopram (LEXAPRO) 10 MG tablet Take 1 tablet (10 mg total) by mouth daily. 90 tablet 1  . estradiol (ESTRACE) 1 MG tablet Take 1 tablet (1 mg total) by mouth daily. 90 tablet 3  . ferrous sulfate 325 (65 FE) MG tablet Take 325 mg by mouth daily with breakfast.    . folic acid (FOLVITE) 021 MCG tablet Take 800 mcg by mouth daily.     . furosemide (LASIX) 20 MG tablet Take 1 tablet (20 mg total) by mouth daily. 90 tablet 1  . gabapentin (NEURONTIN) 800 MG tablet Take 1 tablet (800 mg total) by mouth 4 (four) times daily. 360 tablet 3  . glucose blood (ONE TOUCH ULTRA TEST) test strip Check Blood sugar once daily 100 each 12  . KLOR-CON M20 20 MEQ tablet Take 1 tablet by mouth once a day. 90 tablet 1  . morphine (MS CONTIN) 30 MG 12 hr tablet Take 30 mg by mouth every 12 (twelve)  hours. Per Dr. Nicholaus Bloom with Pain Mgmt  0  . Multiple Vitamins-Minerals (MULTIVITAMIN GUMMIES WOMENS PO) Take 1 tablet by mouth daily.     . mupirocin ointment (BACTROBAN) 2 % Apply 1 application topically daily. Apply to the affected area 2 times a day 22 g 3  . omeprazole (PRILOSEC) 40 MG capsule Take  1 capsule (40 mg total) by mouth daily. 90 capsule 3  . OneTouch Delica Lancets 24Q MISC Check blood sugar once daily 100 each 12  . sitaGLIPtin-metformin (JANUMET) 50-1000 MG tablet Take 2 tablets by mouth at bedtime. 180 tablet 1  . Specialty Vitamins Products (BIOTIN PLUS KERATIN) 10000-100 MCG-MG TABS Take 1 tablet by mouth daily.    [name] tiZANidine (ZANAFLEX) 4 MG tablet Take 1 tablet (4 mg total) by mouth 3 (three) times daily as needed for muscle spasms. 270 tablet 0  . methocarbamol (ROBAXIN) 750 MG tablet Take 1 tablet (750 mg total) by mouth 2 (two) times daily as needed for muscle spasms. 60 tablet 0   No facility-administered medications prior to visit.    Allergies  Allergen Reactions  . Sulfonamide Derivatives Hives    Review of Systems  Constitutional: Negative for chills, fever and malaise/fatigue.  HENT: Negative for congestion and hearing loss.   Eyes: Negative for discharge.  Respiratory: Negative for cough, sputum production and shortness of breath.   Cardiovascular: Negative for chest pain, palpitations and leg swelling.  Gastrointestinal: Negative for abdominal pain, blood in stool, constipation, diarrhea, heartburn, nausea and vomiting.  Genitourinary: Negative for dysuria, frequency, hematuria and urgency.  Musculoskeletal: Negative for back pain, falls and myalgias.  Skin: Negative for rash.  Neurological: Negative for dizziness, sensory change, loss of consciousness, weakness and headaches.  Endo/Heme/Allergies: Negative for environmental allergies. Does not bruise/bleed easily.  Psychiatric/Behavioral: Negative for depression and suicidal ideas. The patient  is not nervous/anxious and does not have insomnia.   All other systems reviewed and are negative.      Objective:    Physical Exam Nursing note reviewed.  Pulmonary:     Effort: Pulmonary effort is normal.     There were no vitals taken for this visit. Wt Readings from Last 3 Encounters:  12/27/19 185 lb 9.6 oz (84.2 kg)  12/01/19 178 lb (80.7 kg)  10/26/19 182 lb 9.6 oz (82.8 kg)  There were no vitals filed for this visit.   Diabetic Foot Exam - Simple   No data filed    Lab Results  Component Value Date   WBC 9.4 10/26/2019   HGB 12.6 10/26/2019   HCT 37.2 10/26/2019   PLT 237.0 10/26/2019   GLUCOSE 155 (H) 12/27/2019   CHOL 156 12/27/2019   TRIG 143 12/27/2019   HDL 63 12/27/2019   LDLCALC 70 12/27/2019   ALT 17 12/27/2019   AST 14 12/27/2019   NA 140 12/27/2019   K 3.9 12/27/2019   CL 103 12/27/2019   CREATININE 0.66 12/27/2019   BUN 16 12/27/2019   CO2 30 12/27/2019   TSH 1.39 05/15/2017   INR 1.0 11/12/2018   HGBA1C 6.4 (H) 12/27/2019   MICROALBUR <0.7 06/25/2019    Lab Results  Component Value Date   TSH 1.39 05/15/2017   Lab Results  Component Value Date   WBC 9.4 10/26/2019   HGB 12.6 10/26/2019   HCT 37.2 10/26/2019   MCV 84.4 10/26/2019   PLT 237.0 10/26/2019   Lab Results  Component Value Date   NA 140 12/27/2019   K 3.9 12/27/2019   CO2 30 12/27/2019   GLUCOSE 155 (H) 12/27/2019   BUN 16 12/27/2019   CREATININE 0.66 12/27/2019   BILITOT 0.2 12/27/2019   ALKPHOS 58 10/26/2019   AST 14 12/27/2019   ALT 17 12/27/2019   PROT 6.1 12/27/2019   ALBUMIN 3.8 10/26/2019   CALCIUM 8.8 12/27/2019  ANIONGAP 12 01/29/2019   GFR 86.62 10/26/2019   Lab Results  Component Value Date   CHOL 156 12/27/2019   Lab Results  Component Value Date   HDL 63 12/27/2019   Lab Results  Component Value Date   LDLCALC 70 12/27/2019   Lab Results  Component Value Date   TRIG 143 12/27/2019   Lab Results  Component Value Date   CHOLHDL  2.5 12/27/2019   Lab Results  Component Value Date   HGBA1C 6.4 (H) 12/27/2019       Assessment & Plan:   Problem List Items Addressed This Visit      Unprioritized   DM (diabetes mellitus) type II uncontrolled, periph vascular disorder ([place])    hgba1c to be checked , minimize simple carbs. Increase exercise as tolerated. Continue current meds       Hyperlipidemia associated with type 2 diabetes mellitus ([place])    Encouraged heart healthy diet, increase exercise, avoid trans fats, consider a krill oil cap daily      Relevant Orders   Lipid panel   Hemoglobin A1c   Comprehensive metabolic panel   Hypertension - Primary    Pt was unable to check bp Will have pt come in for bp check       Relevant Orders   Lipid panel   Hemoglobin A1c   Comprehensive metabolic panel    Other Visit Diagnoses    Uncontrolled type 2 diabetes mellitus with hyperglycemia (HCC)       Relevant Orders   Lipid panel   Hemoglobin A1c   Comprehensive metabolic panel       Meds ordered this encounter  Medications  . methocarbamol (ROBAXIN) 750 MG tablet    Sig: Take 1 tablet (750 mg total) by mouth 2 (two) times daily as needed for muscle spasms.    Dispense:  60 tablet    Refill:  0    I discussed the assessment and treatment plan with the patient. The patient was provided an opportunity to ask questions and all were answered. The patient agreed with the plan and demonstrated an understanding of the instructions.   The patient was advised to call back or seek an in-person evaluation if the symptoms worsen or if the condition fails to improve as anticipated.  I provided 20 minutes of face-to-face time during this encounter.   Ann Held, DO [facility] [phone] (phone) [phone] (fax)  [place]

## 2020-08-24 NOTE — Telephone Encounter (Signed)
Patient states they have the medication on stock Medication: tiZANidine (ZANAFLEX) 4 MG tablet [[id number]]       Has the patient contacted their pharmacy? No (If no, request that the patient contact the pharmacy for the refill.) (If yes, when and what did the pharmacy advise?) Need refill    Preferred Pharmacy (with phone number or street name): [facility], [address], [address] [place], [place], [place] [address]  Phone:  [phone] Fax:  [phone]      Agent: Please be advised that RX refills may take up to 3 business days. We ask that you follow-up with your pharmacy.

## 2020-08-24 NOTE — Assessment & Plan Note (Signed)
hgba1c to be checked, minimize simple carbs. Increase exercise as tolerated. Continue current meds  

## 2020-08-24 NOTE — Assessment & Plan Note (Signed)
Pt was unable to check bp Will have pt come in for bp check

## 2020-08-25 NOTE — Telephone Encounter (Signed)
Pt requesting refill on Tizandine ( last RF was 04/05/20). Pt also has methocarbamol (RF 08/24/20) on her list. Is she able to take both? Please advise

## 2020-09-01 ENCOUNTER — Other Ambulatory Visit (INDEPENDENT_AMBULATORY_CARE_PROVIDER_SITE_OTHER): Payer: Medicare Other

## 2020-09-01 ENCOUNTER — Ambulatory Visit (INDEPENDENT_AMBULATORY_CARE_PROVIDER_SITE_OTHER): Payer: Medicare Other

## 2020-09-01 ENCOUNTER — Other Ambulatory Visit: Payer: Self-pay

## 2020-09-01 VITALS — BP 122/70 | HR 56

## 2020-09-01 DIAGNOSIS — E785 Hyperlipidemia, unspecified: Secondary | ICD-10-CM | POA: Diagnosis not present

## 2020-09-01 DIAGNOSIS — I10 Essential (primary) hypertension: Secondary | ICD-10-CM

## 2020-09-01 DIAGNOSIS — E1169 Type 2 diabetes mellitus with other specified complication: Secondary | ICD-10-CM

## 2020-09-01 DIAGNOSIS — E1165 Type 2 diabetes mellitus with hyperglycemia: Secondary | ICD-10-CM

## 2020-09-01 DIAGNOSIS — Z0000 Encounter for general adult medical examination without abnormal findings: Secondary | ICD-10-CM | POA: Diagnosis not present

## 2020-09-01 LAB — COMPREHENSIVE METABOLIC PANEL
ALT: 12 U/L (ref 0–35)
AST: 17 U/L (ref 0–37)
Albumin: 3.8 g/dL (ref 3.5–5.2)
Alkaline Phosphatase: 53 U/L (ref 39–117)
BUN: 15 mg/dL (ref 6–23)
CO2: 32 mEq/L (ref 19–32)
Calcium: 9.2 mg/dL (ref 8.4–10.5)
Chloride: 103 mEq/L (ref 96–112)
Creatinine, Ser: 0.7 mg/dL (ref 0.40–1.20)
GFR: 86.39 mL/min (ref >60.00)
Glucose, Bld: 110 mg/dL — ABNORMAL HIGH (ref 70–99)
Potassium: 4.1 mEq/L (ref 3.5–5.1)
Sodium: 141 mEq/L (ref 135–145)
Total Bilirubin: 0.4 mg/dL (ref 0.2–1.2)
Total Protein: 6.8 g/dL (ref 6.0–8.3)

## 2020-09-01 LAB — LIPID PANEL
Cholesterol: 155 mg/dL (ref 0–200)
HDL: 65.8 mg/dL (ref >39.00)
LDL Cholesterol: 72 mg/dL (ref 0–99)
NonHDL: 89.19
Total CHOL/HDL Ratio: 2
Triglycerides: 85 mg/dL (ref 0.0–149.0)
VLDL: 17 mg/dL (ref 0.0–40.0)

## 2020-09-01 LAB — HEMOGLOBIN A1C: Hgb A1c MFr Bld: 6.3 % (ref 4.6–6.5)

## 2020-09-01 NOTE — Progress Notes (Signed)
Pt here for Blood pressure check per Dr. Etter Sjogren  Pt currently takes: amlodipine 5 mg daily, bisoprolol-hctz 10-6.25 mg daily   Pt reports compliance with medication.  BP today @ = 122/70 HR = 56

## 2020-09-02 LAB — MICROALBUMIN / CREATININE URINE RATIO
Creatinine, Urine: 102 mg/dL (ref 20–275)
Microalb Creat Ratio: 11 mcg/mg creat (ref <30)
Microalb, Ur: 1.1 mg/dL

## 2020-09-06 DIAGNOSIS — Z79891 Long term (current) use of opiate analgesic: Secondary | ICD-10-CM | POA: Diagnosis not present

## 2020-09-06 DIAGNOSIS — E1142 Type 2 diabetes mellitus with diabetic polyneuropathy: Secondary | ICD-10-CM | POA: Diagnosis not present

## 2020-09-06 DIAGNOSIS — G894 Chronic pain syndrome: Secondary | ICD-10-CM | POA: Diagnosis not present

## 2020-09-06 DIAGNOSIS — G4700 Insomnia, unspecified: Secondary | ICD-10-CM | POA: Diagnosis not present

## 2020-09-07 ENCOUNTER — Telehealth: Payer: Self-pay | Admitting: Pharmacist

## 2020-09-07 NOTE — Chronic Care Management (AMB) (Signed)
Chronic Care Management Pharmacy Assistant   Name: Brittany [name]  MRN: [id number] DOB: Mar 02, 1946   Reason for Encounter: Disease State General    Recent office visits:  08/24/20 Roma Schanz (PCP) Video visit General Follow up.   Recent consult visits:  08/17/20 Bevely Palmer Persons, PA (Orthopedics) Left foot pain.   08/16/20 Alta Corning (Urology) Postoperative check.Start Flagyl 500 mg and Augmentin 875-125 mg.  07/26/20 [place] Pink (Urology) Postoperative visit.   07/25/20 Bevely Palmer Persons (Orthopedics) Left foot wound.   07/17/20 Alona Bene (Urology) Prolapse of vaginal vault.  06/21/20 [place] Pink (Urology) Hydronephrosis     Hospital visits:  None in previous 6 months  Medications: Outpatient Encounter Medications as of 09/07/2020  Medication Sig  . amLODipine (NORVASC) 5 MG tablet Take 1 tablet (5 mg total) by mouth daily.  [name] aspirin EC 81 MG tablet Take 1 tablet (81 mg total) by mouth 2 (two) times daily. (Patient taking differently: Take 81 mg by mouth at bedtime. )  . atorvastatin (LIPITOR) 10 MG tablet Take 1 tablet (10 mg total) by mouth daily.  . bisoprolol-hydrochlorothiazide (ZIAC) 10-6.25 MG tablet Take 1 tablet by mouth daily.  . Blood Glucose Monitoring Suppl (ONE TOUCH ULTRA MINI) w/Device KIT Use as directed once a day.  Dx Code:  E11.42  . calcium carbonate (TUMS - DOSED IN MG ELEMENTAL CALCIUM) 500 MG chewable tablet Chew 1 tablet by mouth as needed for indigestion or heartburn.  . docusate sodium (COLACE) 100 MG capsule Take 100 mg by mouth 2 (two) times daily.  [name] escitalopram (LEXAPRO) 10 MG tablet Take 1 tablet (10 mg total) by mouth daily.  [name] estradiol (ESTRACE) 1 MG tablet Take 1 tablet (1 mg total) by mouth daily.  . ferrous sulfate 325 (65 FE) MG tablet Take 325 mg by mouth daily with breakfast.  . folic acid (FOLVITE) 771 MCG tablet Take 800 mcg by mouth daily.   . furosemide (LASIX) 20 MG tablet Take 1  tablet (20 mg total) by mouth daily.  [name] gabapentin (NEURONTIN) 800 MG tablet Take 1 tablet (800 mg total) by mouth 4 (four) times daily.  [name] glucose blood (ONE TOUCH ULTRA TEST) test strip Check Blood sugar once daily  . KLOR-CON M20 20 MEQ tablet Take 1 tablet by mouth once a day.  . methocarbamol (ROBAXIN) 750 MG tablet Take 1 tablet (750 mg total) by mouth 2 (two) times daily as needed for muscle spasms.  [name] morphine (MS CONTIN) 30 MG 12 hr tablet Take 30 mg by mouth every 12 (twelve) hours. Per Dr. Nicholaus Bloom with Pain Mgmt  . Multiple Vitamins-Minerals (MULTIVITAMIN GUMMIES WOMENS PO) Take 1 tablet by mouth daily.   . mupirocin ointment (BACTROBAN) 2 % Apply 1 application topically daily. Apply to the affected area 2 times a day  . omeprazole (PRILOSEC) 40 MG capsule Take 1 capsule (40 mg total) by mouth daily.  [name] Delica Lancets 16F MISC Check blood sugar once daily  . sitaGLIPtin-metformin (JANUMET) 50-1000 MG tablet Take 2 tablets by mouth at bedtime.  [name] Specialty Vitamins Products (BIOTIN PLUS KERATIN) 10000-100 MCG-MG TABS Take 1 tablet by mouth daily.  [name] tiZANidine (ZANAFLEX) 4 MG tablet Take 1 tablet (4 mg total) by mouth 3 (three) times daily as needed for muscle spasms.   No facility-administered encounter medications on file as of 09/07/2020.   Have you had any problems recently with your health? Patient states she is not having any problems  with her health.  Have you had any problems with your pharmacy? Patient states she is not having any problems with her pharmacy.  What issues or side effects are you having with your medications? Patient states she is not having any side effects that she knows of.  What would you like me to pass along to [place] for them to help you with?  Patient states there is nothing at this time.  What can we do to take care of you better? Patient states there is nothing at this time.   Star Rating Drugs: Atorvastatin 10 mg  last filled 06/22/20 90 DS janumet 50-1000 mg last filled 06/22/20 90 DS  [facility] Clinical Pharmacist Assistant [phone]

## 2020-09-14 ENCOUNTER — Ambulatory Visit (INDEPENDENT_AMBULATORY_CARE_PROVIDER_SITE_OTHER): Payer: Medicare Other | Admitting: Physician Assistant

## 2020-09-14 ENCOUNTER — Encounter: Payer: Self-pay | Admitting: Orthopedic Surgery

## 2020-09-14 DIAGNOSIS — M79672 Pain in left foot: Secondary | ICD-10-CM | POA: Diagnosis not present

## 2020-09-14 NOTE — Progress Notes (Signed)
Office Visit Note   Patient: Brittany [name]           Date of Birth: Sep 05, 1945           MRN: [id number] Visit Date: 09/14/2020              Requested by: Carollee Herter, [place], [place] [address] [place],  [place] [address] PCP: Ann Held, DO  Chief Complaint  Patient presents with   Left Foot - Follow-up      HPI: Is a pleasant 74 year old woman who follows up in today for her left medial plantar foot ulcer.  She feels it is doing better she is wearing a felt relieving pad  Assessment & Plan: Visit Diagnoses: No diagnosis found.  Plan: There is no significant callus to debride today.  Would have her continue with relieving doughnut follow-up in 3 weeks or sooner if any concerns  Follow-Up Instructions: No follow-ups on file.   Ortho Exam  Patient is alert, oriented, no adenopathy, well-dressed, normal affect, normal respiratory effort. Examination no swelling in the foot she has a small 2 mm skin crack.  This does not probe deeply does not surrounded by any erythema or cellulitis very very thin callusing no ascending Cellulitis  Imaging: No results found. No images are attached to the encounter.  Labs: Lab Results  Component Value Date   HGBA1C 6.3 09/01/2020   HGBA1C 6.4 (H) 12/27/2019   HGBA1C 6.5 06/25/2019   REPTSTATUS 08/15/2017 FINAL 08/10/2017   GRAMSTAIN NO WBC SEEN NO ORGANISMS SEEN  08/10/2017   CULT  08/10/2017    No growth aerobically or anaerobically. Performed at [facility], [place] [address], [place], [place] [address]    Doy Hutching  03/11/2016    Three or more organisms present,each greater than 10,000 CFU/mL.These organisms,commonly found on external and internal genitalia,are considered to be colonizers.No further testing performed.      Lab Results  Component Value Date   ALBUMIN 3.8 09/01/2020   ALBUMIN 3.8 10/26/2019   ALBUMIN 3.5 06/25/2019    No results found for: MG No results found for:  VD25OH  No results found for: PREALBUMIN CBC EXTENDED Latest Ref Rng & Units 10/26/2019 01/29/2019 11/18/2018  WBC 4.0 - 10.5 K/uL 9.4 10.6(H) 14.0(H)  RBC 3.87 - 5.11 Mil/uL 4.41 4.55 3.34(L)  HGB 12.0 - 15.0 g/dL 12.6 12.2 9.6(L)  HCT 36.0 - 46.0 % 37.2 39.6 28.8(L)  PLT 150.0 - 400.0 K/uL 237.0 244 164  NEUTROABS 1.4 - 7.7 K/uL 6.5 - -  LYMPHSABS 0.7 - 4.0 K/uL 2.0 - -     There is no height or weight on file to calculate BMI.  Orders:  No orders of the defined types were placed in this encounter.  No orders of the defined types were placed in this encounter.    Procedures: No procedures performed  Clinical Data: No additional findings.  ROS:  All other systems negative, except as noted in the HPI. Review of Systems  Objective: Vital Signs: There were no vitals taken for this visit.  Specialty Comments:  No specialty comments available.  PMFS History: Patient Active Problem List   Diagnosis Date Noted   Abdominal bloating 10/26/2019   Ulcer of toe of left foot, limited to breakdown of skin ([place]) 07/13/2019   Status post total replacement of left hip 11/16/2018   Vaginal prolapse 09/30/2018   Primary osteoarthritis of left hip 06/10/2018   Osteomyelitis of  second toe of right foot ([place]) 04/24/2018   Osteomyelitis of right foot ([place]) 04/21/2018   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 ([place]) 04/17/2018   Callus 01/13/2018   Generalized anxiety disorder 10/16/2017   Menopause 10/16/2017   DM (diabetes mellitus) type II uncontrolled, periph vascular disorder ([place]) 10/16/2017   Hyperlipidemia associated with type 2 diabetes mellitus ([place]) 10/16/2017   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 ([place]) 10/14/2017   Osteomyelitis of great toe of right foot ([place]) 09/25/2017   Pain in right foot 09/16/2017   Cellulitis 08/08/2017   Hypoglycemia without diagnosis of diabetes mellitus 08/08/2017   Peripheral neuropathy 08/08/2017   Abscess  08/08/2017   Hyperlipidemia LDL goal <70 09/22/2016   Tooth pain 09/03/2012   Edema 09/03/2012   Supraclavicular fossa fullness 06/18/2012   Hair loss 09/18/2010   Fatigue 09/18/2010   DYSPEPSIA&OTHER SPEC DISORDERS FUNCTION STOMACH 05/08/2010   DIARRHEA 05/08/2010   DIZZINESS 04/19/2010   OTHER ACUTE SINUSITIS 02/28/2010   NAUSEA 02/28/2010   Pain in right ankle and joints of right foot 02/15/2010   URI 07/09/2007   GERD 02/12/2007   LOW BACK PAIN 02/12/2007   Hypertension 09/03/2006   HOT FLASHES 09/03/2006   INSOMNIA 09/03/2006   Past Medical History:  Diagnosis Date   Anemia    as a younger woman   Arthritis    "all over my body" (10/01/2017)   Chronic pain    In pain clinic    Constipation    Depression    Diabetic peripheral neuropathy (HCC)    GERD (gastroesophageal reflux disease)    High cholesterol    History of hiatal hernia    Hypertension    Type II diabetes mellitus ([place]) dx'd ~ 2008    Family History  Problem Relation Age of Onset   Cancer Mother        breast   Depression Mother        bipolar   Breast cancer Mother 42   Heart disease Father 29       MI   Diabetes Brother    Kidney disease Brother    Hypertension Brother    Hyperlipidemia Brother    Stroke Brother    Coronary artery disease Other    Diabetes Other     Past Surgical History:  Procedure Laterality Date   AMPUTATION Right 09/29/2017   Procedure: AMPUTATION RIGHT 1ST RAY;  Surgeon: Leandrew Koyanagi, MD;  Location: [facility];  Service: Orthopedics;  Laterality: Right;   AMPUTATION Right 01/29/2019   Procedure: RIGHT TRANSMETATARSAL AMPUTATION;  Surgeon: Newt Minion, MD;  Location: [facility];  Service: Orthopedics;  Laterality: Right;   AMPUTATION TOE Right 04/24/2018   Procedure: RIGHT 2ND TOE PROXIMAL INTERPHALANGEAL JOINT DISARTICULATION;  Surgeon: Leandrew Koyanagi, MD;  Location: [facility];  Service: Orthopedics;  Laterality: Right;   APPLICATION OF WOUND VAC Right  09/29/2017   "foot"   BLADDER SUSPENSION  07/13/2020   Dr Zigmund Daniel    COLONOSCOPY     I & D EXTREMITY Right 08/10/2017   Procedure: IRRIGATION AND DEBRIDEMENT FOOT;  Surgeon: Leandrew Koyanagi, MD;  Location: [facility];  Service: Orthopedics;  Laterality: Right;   [place]   TOE SURGERY Right 1980s   bone removed "inbetween my toes"   TOTAL HIP ARTHROPLASTY Left 11/16/2018   Procedure: LEFT TOTAL HIP ARTHROPLASTY ANTERIOR APPROACH;  Surgeon: Erlinda Hong,  Marylynn Pearson, MD;  Location: [facility];  Service: Orthopedics;  Laterality: Left;   VAGINAL HYSTERECTOMY     Social History   Occupational History   Not on file  Tobacco Use   Smoking status: Never   Smokeless tobacco: Never  Vaping Use   Vaping Use: Never used  Substance and Sexual Activity   Alcohol use: Never   Drug use: Never   Sexual activity: Not Currently    Partners: Male

## 2020-10-05 ENCOUNTER — Ambulatory Visit: Payer: Medicare Other | Admitting: Orthopedic Surgery

## 2020-10-05 DIAGNOSIS — L97521 Non-pressure chronic ulcer of other part of left foot limited to breakdown of skin: Secondary | ICD-10-CM

## 2020-10-05 DIAGNOSIS — E1142 Type 2 diabetes mellitus with diabetic polyneuropathy: Secondary | ICD-10-CM

## 2020-10-10 ENCOUNTER — Encounter: Payer: Self-pay | Admitting: Orthopedic Surgery

## 2020-10-10 NOTE — Progress Notes (Signed)
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  She is weightbearing in a postoperative shoe with insert.  She states she has some pink drainage.  Assessment & Plan: Visit Diagnoses:  1. Non-pressure chronic ulcer of other part of left foot limited to breakdown of skin ([place])   2. Diabetic polyneuropathy associated with type 2 diabetes mellitus ([place])     Plan: The ulcer was debrided she was given a new felt relieving donut and a postoperative shoe.  Follow-Up Instructions: Return in about 4 weeks (around 11/02/2020).   Ortho Exam  Patient is alert, oriented, no adenopathy, well-dressed, normal affect, normal respiratory effort. Examination patient has a good pulse she has a stable Charcot collapse without a redness without active Charcot arthropathy.  She has a new ulcer that is 1 cm in diameter.  After informed consent a 10 blade knife was used to debride the skin and soft tissue back to healthy viable granulation tissue without exposed bone or tendon silver nitrate was used for hemostasis.  After debridement the ulcer is 2 cm in diameter 1 mm deep.  Imaging: No results found. No images are attached to the encounter.  Labs: Lab Results  Component Value Date   HGBA1C 6.3 09/01/2020   HGBA1C 6.4 (H) 12/27/2019   HGBA1C 6.5 06/25/2019   REPTSTATUS 08/15/2017 FINAL 08/10/2017   GRAMSTAIN NO WBC SEEN NO ORGANISMS SEEN  08/10/2017   CULT  08/10/2017    No growth aerobically or  anaerobically. Performed at [facility], [place] [address], [place], [place] [address]    Doy Hutching  03/11/2016    Three or more organisms present,each greater than 10,000 CFU/mL.These organisms,commonly found on external and internal genitalia,are considered to be colonizers.No further testing performed.      Lab Results  Component Value Date   ALBUMIN 3.8 09/01/2020   ALBUMIN 3.8 10/26/2019   ALBUMIN 3.5 06/25/2019    No results found for: MG No results found for: VD25OH  No results found for: PREALBUMIN CBC EXTENDED Latest Ref Rng & Units 10/26/2019 01/29/2019 11/18/2018  WBC 4.0 - 10.5 K/uL 9.4 10.6(H) 14.0(H)  RBC 3.87 - 5.11 Mil/uL 4.41 4.55 3.34(L)  HGB 12.0 - 15.0 g/dL 12.6 12.2 9.6(L)  HCT 36.0 - 46.0 % 37.2 39.6 28.8(L)  PLT 150.0 - 400.0 K/uL 237.0 244 164  NEUTROABS 1.4 - 7.7 K/uL 6.5 - -  LYMPHSABS 0.7 - 4.0 K/uL 2.0 - -     There is no height or weight on file to calculate BMI.  Orders:  No orders of the defined types were placed in this encounter.  No orders of the defined types were placed in this encounter.    Procedures: No procedures performed  Clinical Data: No additional findings.  ROS:  All other systems negative, except as noted in the HPI. Review of Systems  Objective: Vital Signs: There were no vitals taken for this visit.  Specialty Comments:  No specialty comments available.  PMFS History: Patient Active Problem List   Diagnosis Date Noted   Abdominal bloating 10/26/2019   Ulcer of toe of left foot, limited to breakdown of skin ([place]) 07/13/2019   Status post total replacement of left hip 11/16/2018   Vaginal prolapse 09/30/2018   Primary osteoarthritis of left hip 06/10/2018   Osteomyelitis of second toe of right foot ([place]) 04/24/2018   Osteomyelitis of right foot ([place]) 04/21/2018   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 ([place]) 04/17/2018   Callus 01/13/2018   Generalized anxiety disorder 10/16/2017   Menopause  10/16/2017   DM (diabetes mellitus) type II uncontrolled, periph vascular disorder ([place]) 10/16/2017   Hyperlipidemia associated with type 2 diabetes mellitus ([place]) 10/16/2017   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 ([place]) 10/14/2017   Osteomyelitis of great toe of right foot ([place]) 09/25/2017   Pain in right foot 09/16/2017   Cellulitis 08/08/2017   Hypoglycemia without diagnosis of diabetes mellitus 08/08/2017   Peripheral neuropathy 08/08/2017   Abscess 08/08/2017   Hyperlipidemia LDL goal <70 09/22/2016   Tooth pain 09/03/2012   Edema 09/03/2012   Supraclavicular fossa fullness 06/18/2012   Hair loss 09/18/2010   Fatigue 09/18/2010   DYSPEPSIA&OTHER SPEC DISORDERS FUNCTION STOMACH 05/08/2010   DIARRHEA 05/08/2010   DIZZINESS 04/19/2010   OTHER ACUTE SINUSITIS 02/28/2010   NAUSEA 02/28/2010   Pain in right ankle and joints of right foot 02/15/2010   URI 07/09/2007   GERD 02/12/2007   LOW BACK PAIN 02/12/2007   Hypertension 09/03/2006   HOT FLASHES 09/03/2006   INSOMNIA 09/03/2006   Past Medical History:  Diagnosis Date   Anemia    as a younger woman   Arthritis    "all over my body" (10/01/2017)   Chronic pain    In pain clinic    Constipation    Depression    Diabetic peripheral neuropathy (HCC)    GERD (gastroesophageal reflux disease)    High cholesterol    History of hiatal hernia    Hypertension    Type II diabetes mellitus ([place]) dx'd ~ 2008    Family History  Problem Relation Age of Onset   Cancer Mother        breast   Depression Mother        bipolar   Breast cancer Mother 76   Heart disease Father 110       MI   Diabetes Brother    Kidney disease Brother    Hypertension Brother    Hyperlipidemia Brother    Stroke Brother    Coronary artery disease Other    Diabetes Other     Past Surgical History:  Procedure Laterality Date   AMPUTATION Right 09/29/2017   Procedure: AMPUTATION RIGHT 1ST RAY;  Surgeon: Leandrew Koyanagi, MD;   Location: [facility];  Service: Orthopedics;  Laterality: Right;   AMPUTATION Right 01/29/2019   Procedure: RIGHT TRANSMETATARSAL AMPUTATION;  Surgeon: Newt Minion, MD;  Location: [facility];  Service: Orthopedics;  Laterality: Right;   AMPUTATION TOE Right 04/24/2018   Procedure: RIGHT 2ND TOE PROXIMAL INTERPHALANGEAL JOINT DISARTICULATION;  Surgeon: Leandrew Koyanagi, MD;  Location: [facility];  Service: Orthopedics;  Laterality: Right;   APPLICATION OF WOUND VAC Right 09/29/2017   "foot"   BLADDER SUSPENSION  07/13/2020   Dr Zigmund Daniel    COLONOSCOPY  I & D EXTREMITY Right 08/10/2017   Procedure: IRRIGATION AND DEBRIDEMENT FOOT;  Surgeon: Leandrew Koyanagi, MD;  Location: [facility];  Service: Orthopedics;  Laterality: Right;   LUMBAR DISC SURGERY  1992   POSTERIOR LUMBAR FUSION  1996   TOE SURGERY Right 1980s   bone removed "inbetween my toes"   TOTAL HIP ARTHROPLASTY Left 11/16/2018   Procedure: LEFT TOTAL HIP ARTHROPLASTY ANTERIOR APPROACH;  Surgeon: Leandrew Koyanagi, MD;  Location: [facility];  Service: Orthopedics;  Laterality: Left;   VAGINAL HYSTERECTOMY     Social History   Occupational History   Not on file  Tobacco Use   Smoking status: Never   Smokeless tobacco: Never  Vaping Use   Vaping Use: Never used  Substance and Sexual Activity   Alcohol use: Never   Drug use: Never   Sexual activity: Not Currently    Partners: Male

## 2020-10-26 ENCOUNTER — Ambulatory Visit (INDEPENDENT_AMBULATORY_CARE_PROVIDER_SITE_OTHER): Payer: Medicare Other | Admitting: Pharmacist

## 2020-10-26 DIAGNOSIS — E1165 Type 2 diabetes mellitus with hyperglycemia: Secondary | ICD-10-CM

## 2020-10-26 DIAGNOSIS — F411 Generalized anxiety disorder: Secondary | ICD-10-CM

## 2020-10-26 DIAGNOSIS — I10 Essential (primary) hypertension: Secondary | ICD-10-CM | POA: Diagnosis not present

## 2020-10-26 DIAGNOSIS — E785 Hyperlipidemia, unspecified: Secondary | ICD-10-CM | POA: Diagnosis not present

## 2020-10-26 DIAGNOSIS — E1169 Type 2 diabetes mellitus with other specified complication: Secondary | ICD-10-CM

## 2020-10-28 NOTE — Chronic Care Management (AMB) (Signed)
Chronic Care Management Pharmacy Note  10/28/2020 Name:  Brittany [name] MRN:  [id number] DOB:  05-Jan-1946   Subjective: Brittany [name] is an 75 y.o. year old female who is a primary patient of Ann Held, DO.  The CCM team was consulted for assistance with disease management and care coordination needs.    Engaged with patient by telephone for follow up visit in response to provider referral for pharmacy case management and/or care coordination services.   Consent to Services:  The patient was given information about Chronic Care Management services, agreed to services, and gave verbal consent prior to initiation of services.  Please see initial visit note for detailed documentation.   Patient Care Team: Carollee Herter, Alferd Apa, DO as PCP - General Nicholaus Bloom, MD as Consulting Physician (Neurology) Milus Banister, MD as Attending Physician (Gastroenterology) Leandrew Koyanagi, MD as Attending Physician (Orthopedic Surgery) Cherre Robins, PharmD (Pharmacist)  Recent office visits: 08/24/2020 - PCP (Dr Etter Sjogren) Video Visit; no med changes 12/27/2019 - PCP (Dr Etter Sjogren) Preventative health visit. No med changes.  Recent consult visits: 10/05/2020 - Ortho (Dr Sharol Given) non pressure chronic ulcer of left foot and diabetic neuropathy. No med changes.  09/14/2020 - ortho (Persons, PA) pain in left foot. Performed debridement; no med changes.  08/16/2020 - urology (Dr Zigmund Daniel - [facility]) Post operative check. Noted soft tissue infection. Treated with Augmentin and Flagly x 10 days.    Hospital visits: Not in last 6 months  Objective:  Lab Results  Component Value Date   CREATININE 0.70 09/01/2020   CREATININE 0.66 12/27/2019   CREATININE 0.67 10/26/2019    Lab Results  Component Value Date   HGBA1C 6.3 09/01/2020   Last diabetic Eye exam:  Lab Results  Component Value Date/Time   HMDIABEYEEXA No Retinopathy 10/28/2019 12:00 AM   HMDIABEYEEXA No Retinopathy  10/28/2019 12:00 AM    Last diabetic Foot exam: No results found for: HMDIABFOOTEX      Component Value Date/Time   CHOL 155 09/01/2020 0906   TRIG 85.0 09/01/2020 0906   HDL 65.80 09/01/2020 0906   CHOLHDL 2 09/01/2020 0906   VLDL 17.0 09/01/2020 0906   LDLCALC 72 09/01/2020 0906   LDLCALC 70 12/27/2019 1333    Hepatic Function Latest Ref Rng & Units 09/01/2020 12/27/2019 10/26/2019  Total Protein 6.0 - 8.3 g/dL 6.8 6.1 6.7  Albumin 3.5 - 5.2 g/dL 3.8 - 3.8  AST 0 - 37 U/L 17 14 16   ALT 0 - 35 U/L 12 17 16   Alk Phosphatase 39 - 117 U/L 53 - 58  Total Bilirubin 0.2 - 1.2 mg/dL 0.4 0.2 0.2  Bilirubin, Direct 0.0 - 0.3 mg/dL - - -    Lab Results  Component Value Date/Time   TSH 1.39 05/15/2017 02:49 PM   TSH 0.46 06/18/2012 10:26 AM   FREET4 1.11 09/18/2010 12:08 PM   FREET4 0.9 02/09/2009 02:01 PM    CBC Latest Ref Rng & Units 10/26/2019 01/29/2019 11/18/2018  WBC 4.0 - 10.5 K/uL 9.4 10.6(H) 14.0(H)  Hemoglobin 12.0 - 15.0 g/dL 12.6 12.2 9.6(L)  Hematocrit 36.0 - 46.0 % 37.2 39.6 28.8(L)  Platelets 150.0 - 400.0 K/uL 237.0 244 164    No results found for: VD25OH  Clinical ASCVD: Yes  The 10-year ASCVD risk score Mikey Bussing DC Jr., et al., 2013) is: 24%   Values used to calculate the score:     Age: 3 years  Sex: Female     Is Non-Hispanic African American: No     Diabetic: Yes     Tobacco smoker: No     Systolic Blood Pressure: 623 mmHg     Is BP treated: Yes     HDL Cholesterol: 65.8 mg/dL     Total Cholesterol: 155 mg/dL     Social History   Tobacco Use  Smoking Status Never  Smokeless Tobacco Never   BP Readings from Last 3 Encounters:  09/01/20 122/70  12/27/19 120/84  12/02/19 135/85   Pulse Readings from Last 3 Encounters:  09/01/20 (!) 56  12/27/19 63  12/02/19 66   Wt Readings from Last 3 Encounters:  12/27/19 185 lb 9.6 oz (84.2 kg)  12/01/19 178 lb (80.7 kg)  10/26/19 182 lb 9.6 oz (82.8 kg)    Assessment: Review of patient past medical  history, allergies, medications, health status, including review of consultants reports, laboratory and other test data, was performed as part of comprehensive evaluation and provision of chronic care management services.   SDOH:  (Social Determinants of Health) assessments and interventions performed:  SDOH Interventions    Flowsheet Row Most Recent Value  SDOH Interventions   Financial Strain Interventions Intervention Not Indicated  Physical Activity Interventions Other (Comments)  [Discussed increasing exercise - walking, Silver Social research officer, [employer] at [employer] or on [employer] program recommended. Increase to goal of at least 150 minutes per week.]  Depression Interventions/Treatment  Currently on Treatment, Counseling  [grief couseling suggested regarding recent death of brother]       CCM Care Plan  Allergies  Allergen Reactions   Sulfonamide Derivatives Hives    Medications Reviewed Today     Reviewed by Cherre Robins, PharmD (Pharmacist) on 10/26/20 at 1349  Med List Status: <None>   Medication Order Taking? Sig Documenting Provider Last Dose Status Informant  amLODipine (NORVASC) 5 MG tablet [id number] Yes Take 1 tablet (5 mg total) by mouth daily. Ann Held, DO Taking Active   aspirin EC 81 MG tablet [id number] Yes Take 1 tablet (81 mg total) by mouth 2 (two) times daily.  Patient taking differently: Take 81 mg by mouth at bedtime.   Leandrew Koyanagi, MD Taking Active   atorvastatin (LIPITOR) 10 MG tablet [id number] Yes Take 1 tablet (10 mg total) by mouth daily. Roma Schanz R, DO Taking Active   bisoprolol-hydrochlorothiazide [facility]) 10-6.25 MG tablet [id number] Yes Take 1 tablet by mouth daily. Roma Schanz R, DO Taking Active   Blood Glucose Monitoring Suppl (ONE TOUCH ULTRA MINI) w/Device KIT [id number] Yes Use as directed once a day.  Dx Code:  I96.78 Carollee Herter, Alferd Apa, DO Taking Active Self  calcium carbonate (TUMS - DOSED IN MG ELEMENTAL CALCIUM) 500  MG chewable tablet [id number] Yes Chew 1 tablet by mouth as needed for indigestion or heartburn. [provider] Taking Active   docusate sodium (COLACE) 100 MG capsule [id number] Yes Take 100 mg by mouth 2 (two) times daily. [provider] Taking Active Self  escitalopram (LEXAPRO) 10 MG tablet [id number] Yes Take 1 tablet (10 mg total) by mouth daily. Ann Held, DO Taking Active   estradiol (ESTRACE) 1 MG tablet [id number] Yes Take 1 tablet (1 mg total) by mouth daily. Ann Held, DO Taking Active   ferrous sulfate 325 (65 FE) MG tablet [id number] Yes Take 325 mg by mouth daily with breakfast. [provider] Taking Active Self  folic acid (FOLVITE) 267  MCG tablet [id number] Yes Take 800 mcg by mouth daily.  [provider] Taking Active Self  furosemide (LASIX) 20 MG tablet [id number] Yes Take 1 tablet (20 mg total) by mouth daily.  Patient taking differently: Take 20 mg by mouth every other day.   Ann Held, DO Taking Active   gabapentin (NEURONTIN) 600 MG tablet [id number] Yes Take 600 mg by mouth 4 (four) times daily. [provider] Taking Active   glucose blood (ONE TOUCH ULTRA TEST) test strip [id number] Yes Check Blood sugar once daily Carollee Herter, Alferd Apa, DO Taking Active   KLOR-CON M20 20 MEQ tablet [id number] Yes Take 1 tablet by mouth once a day. Ann Held, DO Taking Active   morphine (MS CONTIN) 30 MG 12 hr tablet [id number] Yes Take 30 mg by mouth every 12 (twelve) hours. Per Dr. Nicholaus Bloom with Pain Mgmt [provider] Taking Active Self  Multiple Vitamins-Minerals (MULTIVITAMIN GUMMIES WOMENS PO) [id number] Yes Take 1 tablet by mouth daily.  [provider] Taking Active Self  omeprazole (PRILOSEC) 40 MG capsule [id number] Yes Take 1 capsule (40 mg total) by mouth daily. Ann Held, DO Taking Active   OneTouch Delica Lancets 02D MISC [id number] Yes Check blood sugar once  daily Carollee Herter, Alferd Apa, DO Taking Active   sitaGLIPtin-metformin (JANUMET) 50-1000 MG tablet [id number] Yes Take 2 tablets by mouth at bedtime. Ann Held, DO Taking Active   Specialty Vitamins Products (BIOTIN PLUS KERATIN) 10000-100 MCG-MG TABS [id number] Yes Take 1 tablet by mouth daily. [provider] Taking Active Self  tiZANidine (ZANAFLEX) 4 MG tablet [id number] Yes Take 1 tablet (4 mg total) by mouth 3 (three) times daily as needed for muscle spasms. Ann Held, DO Taking Active             Patient Active Problem List   Diagnosis Date Noted   Abdominal bloating 10/26/2019   Ulcer of toe of left foot, limited to breakdown of skin ([place]) 07/13/2019   Status post total replacement of left hip 11/16/2018   Vaginal prolapse 09/30/2018   Primary osteoarthritis of left hip 06/10/2018   Osteomyelitis of second toe of right foot ([place]) 04/24/2018   Osteomyelitis of right foot ([place]) 04/21/2018   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 ([place]) 04/17/2018   Callus 01/13/2018   Generalized anxiety disorder 10/16/2017   Menopause 10/16/2017   DM (diabetes mellitus) type II uncontrolled, periph vascular disorder ([place]) 10/16/2017   Hyperlipidemia associated with type 2 diabetes mellitus ([place]) 10/16/2017   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 ([place]) 10/14/2017   Osteomyelitis of great toe of right foot ([place]) 09/25/2017   Pain in right foot 09/16/2017   Cellulitis 08/08/2017   Hypoglycemia without diagnosis of diabetes mellitus 08/08/2017   Peripheral neuropathy 08/08/2017   Abscess 08/08/2017   Hyperlipidemia LDL goal <70 09/22/2016   Tooth pain 09/03/2012   Edema 09/03/2012   Supraclavicular fossa fullness 06/18/2012   Hair loss 09/18/2010   Fatigue 09/18/2010   DYSPEPSIA&OTHER SPEC DISORDERS FUNCTION STOMACH 05/08/2010   DIARRHEA 05/08/2010   DIZZINESS 04/19/2010   OTHER ACUTE SINUSITIS 02/28/2010   NAUSEA 02/28/2010    Pain in right ankle and joints of right foot 02/15/2010   URI 07/09/2007   GERD 02/12/2007   LOW BACK PAIN 02/12/2007   Hypertension 09/03/2006   HOT FLASHES 09/03/2006   INSOMNIA 09/03/2006    Immunization History  Administered Date(s)  Administered   DTP 07/14/2002   Fluad Quad(high Dose 65+) 12/27/2019   Influenza Whole 02/12/2007   Influenza, High Dose Seasonal PF 02/11/2014, 02/10/2015, 02/10/2015, 03/26/2018   Influenza,inj,Quad PF,6+ Mos 05/14/2013, 02/19/2016   Pneumococcal Conjugate-13 05/04/2015   Pneumococcal Polysaccharide-23 05/14/2013, 03/26/2018   Tdap 03/26/2018   Zoster, Live 01/02/2010    Conditions to be addressed/monitored: HTN, HLD, DMII, Anxiety, and GERD; PVD; insomnia; neuropathy  Care Plan : General Pharmacy (Adult)  Updates made by Cherre Robins, PHARMD since 10/28/2020 12:00 AM     Problem: Type 2 DM; HTN; hyperlipidemia; GAD; chronic pain; anemia; edema; neuropathy; PVD; GERD   Priority: High     Long-Range Goal: Chronic Care and Medication Management Pharmacy Goals   Start Date: 10/26/2020  Priority: High  Note:   Current Barriers:  Education needed regarding medication therapy and chronic conditions management.  Chronic Disease Management support, education, and care coordination needs related to Diabetes, Hypertension, Hyperlipidemia, Depression, Neuropathy/Pain, Muscle Spasm, GERD, Estrogen Deficiency  Pharmacist Clinical Goal(s):  Over the next 180 days, patient will achieve adherence to monitoring guidelines and medication adherence to achieve therapeutic efficacy maintain control of HTN, hyperlipidemia and type 2 diabetes as evidenced by attainment of goals listed below and prevention of complications  Increase physical activity  through collaboration with PharmD and provider.   Interventions: 1:1 collaboration with Carollee Herter, Alferd Apa, DO regarding development and update of comprehensive plan of care as evidenced by provider  attestation and co-signature Inter-disciplinary care team collaboration (see longitudinal plan of care) Comprehensive medication review performed; medication list updated in electronic medical record    Hypertension Controlled;  BP goal <140/90 Has not been checking BP at home recently States edema is mild - mostly in evening.  Current regimen:  Amlodipine 20m daily Bisoprolol-HCTZ 10-6.25mg daily furosemide 270mevery other day Intervention:  Reviewed refill history - Amlodipine and bisoprolol HCTZ last filled #90 on 08/24/2020 and furosemide last filled #90 on 06/22/2020 Recommended maintain current hypertension medication regimen.  Recommended check blood pressure once per week, record and bring to future appointments.   Hyperlipidemia / PVD Last LDL was slightly elevated at 72 but previous LDL was 70; LDL goal <70 Current regimen:  Atorvastatin 1043maily Aspirin 81m51mily  Interventions:  Maintain cholesterol medication regimen.   Diabetes Controlled; A1c goal <7% Checking BG every other day FBG usually 110 to 140 Highest BG was 180 Current regimen:  Janumet 50-1000mg71mablets daily at bedtime Diet: patient is limiting intake of sugar, bread and starchy vegetable (potatoes and corn)  Exercise: not currently Interventions:  Maintain diabetes medication regimen Continue to check blood glucose every other day, record and bring to future appointments Discussed increasing exercise - walking, Silver [employer]ne[employer] at [employer] line [employer] recommended. Increase to goal of at least 150 minutes per week.   GERD / acid reflux Goal: reduce symptoms of GERD Current regimen:  Omeprazole 40mg 84my Tums (calcium carbonate) - take as needed for heart burn symptoms Interventions: Continue current medication for acid reflux.   Estrogen Deficiency Goal:  reduce symptoms of estrogen deficiency and polypharmacy Current regimen:  Estradiol 1mg da45m Interventions:    Discussed risks verses benefits of estrogen replacement  General Anxiety:  Goal: maintain control of anxiety / depression Patient reports her brother recently died. He was shot in random shooting. His sudden death has saddened Mrs. Brunner Ninhy were very close. She reports she is getting better every day.  Current regimen:  Escitalopram  5m daily Interventions: Discussed grief process  Continue current medication therapy Contact office if you would like referral for grief counseling.   Medication management Pharmacist Clinical Goal(s): Over the next 90 days, patient will work with PharmD and providers to maintain optimal medication adherence [facility] Comprehensive medication review performed. Continue current medication management strategy Patient self care activities - Over the next 90 days, patient will: Focus on medication adherence by filling medications appropriately  Take medications as prescribed Report any questions or concerns to PharmD and/or provider(s)  Patient Goals/Self-Care Activities Over the next 180 days, patient will:  take medications as prescribed Check glucose every other day, document, and provide at future appointments,  check blood pressure once per week, document, and provide at future appointments, and  target a minimum of 150 minutes of moderate intensity exercise weekly  Follow Up Plan: Telephone follow up appointment with care management team member scheduled for:  3 months  Please see past updates related to this goal by clicking on the "Past Updates" button in the selected goal       Medication Assistance: None required.  Patient affirms current coverage meets needs.  [id number]: [phone]: [phone] Follow Up:  Patient agrees to Care  Plan and Follow-up.  Plan: Telephone follow up appointment with care management team member scheduled for:  3 months  TCherre Robins PharmD Clinical Pharmacist [phone]

## 2020-10-28 NOTE — Patient Instructions (Signed)
Brittany [name],   It was a pleasure speaking with you today.  I have attached a summary of our visit today and information about your health goals.  If you have any questions or concerns, please feel free to contact me either at the phone number below or with a MyChart message.   Keep up the good work!  Cherre Robins, PharmD Clinical Pharmacist [facility] [phone] (direct line)  [phone] (main office number)  PATIENT GOALS:  Goals Addressed             This Visit's Progress    [place]   On track    CARE PLAN ENTRY  Current Barriers:  Chronic Disease Management support, education, and care coordination needs related to Diabetes, Hypertension, Hyperlipidemia, Depression, Neuropathy/Pain, Muscle Spasm, GERD, Estrogen Deficiency   Hypertension Pharmacist Clinical Goal(s): Over the next 90 days, patient will work with PharmD and providers to maintain BP goal <140/90 BP Readings from Last 3 Encounters:  09/01/20 122/70  12/27/19 120/84  12/02/19 135/85  Current regimen:  Amlodipine '5mg'$  daily Bisoprolol-hctz 10-6.'25mg'$  daily furosemide '20mg'$  every other day Intervention:  Reviewed refill history Patient self care activities - Over the next 90 days, patient will: Maintain hypertension medication regimen.  Check blood pressure once per week, record and bring to future appointments.   Hyperlipidemia Pharmacist Clinical Goal(s): Over the next 90 days, patient will work with PharmD and providers to maintain LDL goal <70 Lipid Panel     Component Value Date/Time   CHOL 155 09/01/2020 0906   TRIG 85.0 09/01/2020 0906   HDL 65.80 09/01/2020 0906   CHOLHDL 2 09/01/2020 0906   VLDL 17.0 09/01/2020 0906   LDLCALC 72 09/01/2020 0906   LDLCALC 70 12/27/2019 1333   Current regimen:  Atorvastatin '10mg'$  daily Patient self care activities - Over the next 90 days, patient will: Maintain cholesterol medication  regimen.   Diabetes Pharmacist Clinical Goal(s): Over the next 90 days, patient will work with PharmD and providers to maintain A1c goal <7% Lab Results  Component Value Date   HGBA1C 6.3 09/01/2020  Current regimen:  Janumet 50-'1000mg'$  2 tablets daily at bedtime Patient self care activities - Over the next 90 days, patient will: Maintain diabetes medication regimen Check blood glucose once daily, record and bring to future appointments Discussed increasing exercise - walking, Silver Sneakers at [facility] or on [employer] program recommended. Increase to goal of at least 150 minutes per week.  GERD / acid reflux Pharmacist Clinical Goal(s) Over the next 90 days, patient will work with PharmD and providers to reduce symptoms of GERD Current regimen:  Omeprazole '40mg'$  daily Tums (calcium carbonate) - take as needed for heart burn symptoms Patient self care activities - Over the next 90 days, patient will: Continue current medication for acid reflux.   Estrogen Deficiency Pharmacist Clinical Goal(s) Over the next 90 days, patient will work with PharmD and providers to reduce symptoms of estrogen deficiency and polypharmacy Current regimen:  Estradiol '1mg'$  daily Interventions: Collaboration with provider regarding medication management (estradiol continuation vs discontinuation) Patient self care activities - Over the next 90 days, patient will: Inform PCP or Pharmacist if hot flashes recur  General Anxiety:  Pharmacist Clinical Goal(s) Over the next 90 days, patient will work with PharmD and providers to maintain control of anxiety / depression Current regimen:  Escitalopram '10mg'$  daily Interventions: Discussed grief process  Patient self care activities - Over the next 90  days, patient will: Continue current medication therapy Contact office if you would like referral for grief counseling.   Medication management Pharmacist Clinical Goal(s): Over the next 90 days,  patient will work with PharmD and providers to maintain optimal medication adherence Current pharmacy: [facility]  Interventions Comprehensive medication review performed. Continue current medication management strategy Patient self care activities - Over the next 90 days, patient will: Focus on medication adherence by filling medications appropriately  Take medications as prescribed Report any questions or concerns to PharmD and/or provider(s)  Please see past updates related to this goal by clicking on the "Past Updates" button in the selected goal          The patient verbalized understanding of instructions, educational materials, and care plan provided today and agreed to receive a mailed copy of patient instructions, educational materials, and care plan.   Telephone follow up appointment with care management team member scheduled for: 3 months  Cherre Robins, PharmD Clinical Pharmacist [place] [facility] [phone]

## 2020-11-01 ENCOUNTER — Encounter: Payer: Self-pay | Admitting: Family

## 2020-11-01 ENCOUNTER — Other Ambulatory Visit: Payer: Self-pay

## 2020-11-01 ENCOUNTER — Ambulatory Visit: Payer: Medicare Other | Admitting: Family

## 2020-11-01 DIAGNOSIS — Z79891 Long term (current) use of opiate analgesic: Secondary | ICD-10-CM | POA: Diagnosis not present

## 2020-11-01 DIAGNOSIS — G894 Chronic pain syndrome: Secondary | ICD-10-CM | POA: Diagnosis not present

## 2020-11-01 DIAGNOSIS — L03115 Cellulitis of right lower limb: Secondary | ICD-10-CM

## 2020-11-01 DIAGNOSIS — L97521 Non-pressure chronic ulcer of other part of left foot limited to breakdown of skin: Secondary | ICD-10-CM

## 2020-11-01 DIAGNOSIS — E1142 Type 2 diabetes mellitus with diabetic polyneuropathy: Secondary | ICD-10-CM | POA: Diagnosis not present

## 2020-11-01 DIAGNOSIS — G4700 Insomnia, unspecified: Secondary | ICD-10-CM | POA: Diagnosis not present

## 2020-11-01 MED ORDER — CEPHALEXIN 500 MG PO CAPS: 500.0000 mg | ORAL_CAPSULE | Freq: Three times a day (TID) | ORAL | 0 refills | Status: DC

## 2020-11-01 NOTE — Progress Notes (Signed)
Office Visit Note   Patient: Brittany [name]           Date of Birth: 04-02-1946           MRN: [id number] Visit Date: 11/01/2020              Requested by: Carollee Herter, [place], [place] [address] [place],  [place] [address] PCP: Carollee Herter, Alferd Apa, DO  Chief Complaint  Patient presents with   Left Foot - Follow-up    Charcot ulcer       HPI: The patient is a 75 year old woman who presents today for planned reevaluation of bilateral lower extremities.  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  She has been weightbearing in a postop shoe with a felt pressure relieving doughnut insert.  Feels this is improving.  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  Plan: Cellulitis of the right lower extremity placed on oral antibiotics.  She will follow-up in 2 weeks.  The left foot ulcer was debrided she was given a new felt pressure relieving doughnut and a new postop shoe as her current shoe broke down  Follow-Up Instructions: No follow-ups on file.   Ortho Exam  Patient is alert, oriented, no adenopathy, well-dressed, normal affect, normal respiratory effort. On examination of the right lower extremity she does have trace edema with erythema and warmth.  From the ankle to the tibial tubercle.  She does have hemosiderin staining bilaterally.  On the left lower extremity she has ulcer beneath the rocker-bottom deformity this is callused about a dime sized there is central ulceration that is just 2 mm in diameter 1 mm deep.  This was debrided of nonviable tissue with a 10 blade knife back to viable tissue there is no  bleeding no surrounding erythema no sign of infection no drainage  Imaging: No results found. No images are attached to the encounter.  Labs: Lab Results  Component Value Date   HGBA1C 6.3 09/01/2020   HGBA1C 6.4 (H) 12/27/2019   HGBA1C 6.5 06/25/2019   REPTSTATUS 08/15/2017 FINAL 08/10/2017   GRAMSTAIN NO WBC SEEN NO ORGANISMS SEEN  08/10/2017   CULT  08/10/2017    No growth aerobically or anaerobically. Performed at [facility], [place] [address] [place], [place] [address]    Doy Hutching  03/11/2016    Three or more organisms present,each greater than 10,000 CFU/mL.These organisms,commonly found on external and internal genitalia,are considered to be colonizers.No further testing performed.      Lab Results  Component Value Date   ALBUMIN 3.8 09/01/2020   ALBUMIN 3.8 10/26/2019   ALBUMIN 3.5 06/25/2019    No results found for: MG No results found for: VD25OH  No results found for: PREALBUMIN CBC EXTENDED Latest Ref Rng & Units 10/26/2019 01/29/2019 11/18/2018  WBC 4.0 - 10.5 K/uL 9.4 10.6(H) 14.0(H)  RBC 3.87 - 5.11 Mil/uL 4.41 4.55 3.34(L)  HGB 12.0 - 15.0 g/dL 12.6 12.2 9.6(L)  HCT 36.0 - 46.0 % 37.2 39.6 28.8(L)  PLT 150.0 - 400.0 K/uL 237.0 244 164  NEUTROABS 1.4 - 7.7 K/uL 6.5 - -  LYMPHSABS  0.7 - 4.0 K/uL 2.0 - -     There is no height or weight on file to calculate BMI.  Orders:  No orders of the defined types were placed in this encounter.  No orders of the defined types were placed in this encounter.    Procedures: No procedures performed  Clinical Data: No additional findings.  ROS:  All other systems negative, except as noted in the HPI. Review of Systems  Constitutional:  Negative for chills and fever.  Cardiovascular:  Positive for leg swelling.  Skin:  Positive for color change and wound.   Objective: Vital Signs: There were no vitals taken for this visit.  Specialty Comments:  No specialty comments available.  PMFS  History: Patient Active Problem List   Diagnosis Date Noted   Abdominal bloating 10/26/2019   Ulcer of toe of left foot, limited to breakdown of skin ([place]) 07/13/2019   Status post total replacement of left hip 11/16/2018   Vaginal prolapse 09/30/2018   Primary osteoarthritis of left hip 06/10/2018   Osteomyelitis of second toe of right foot ([place]) 04/24/2018   Osteomyelitis of right foot ([place]) 04/21/2018   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 ([place]) 04/17/2018   Callus 01/13/2018   Generalized anxiety disorder 10/16/2017   Menopause 10/16/2017   DM (diabetes mellitus) type II uncontrolled, periph vascular disorder ([place]) 10/16/2017   Hyperlipidemia associated with type 2 diabetes mellitus ([place]) 10/16/2017   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 ([place]) 10/14/2017   Osteomyelitis of great toe of right foot ([place]) 09/25/2017   Pain in right foot 09/16/2017   Cellulitis 08/08/2017   Hypoglycemia without diagnosis of diabetes mellitus 08/08/2017   Peripheral neuropathy 08/08/2017   Abscess 08/08/2017   Hyperlipidemia LDL goal <70 09/22/2016   Tooth pain 09/03/2012   Edema 09/03/2012   Supraclavicular fossa fullness 06/18/2012   Hair loss 09/18/2010   Fatigue 09/18/2010   DYSPEPSIA&OTHER SPEC DISORDERS FUNCTION STOMACH 05/08/2010   DIARRHEA 05/08/2010   DIZZINESS 04/19/2010   OTHER ACUTE SINUSITIS 02/28/2010   NAUSEA 02/28/2010   Pain in right ankle and joints of right foot 02/15/2010   URI 07/09/2007   GERD 02/12/2007   LOW BACK PAIN 02/12/2007   Hypertension 09/03/2006   HOT FLASHES 09/03/2006   INSOMNIA 09/03/2006   Past Medical History:  Diagnosis Date   Anemia    as a younger woman   Arthritis    "all over my body" (10/01/2017)   Chronic pain    In pain clinic    Constipation    Depression    Diabetic peripheral neuropathy (HCC)    GERD (gastroesophageal reflux disease)    High cholesterol    History of hiatal hernia    Hypertension     Type II diabetes mellitus ([place]) dx'd ~ 2008    Family History  Problem Relation Age of Onset   Cancer Mother        breast   Depression Mother        bipolar   Breast cancer Mother 3   Heart disease Father 54       MI   Diabetes Brother    Kidney disease Brother    Hypertension Brother    Hyperlipidemia Brother    Stroke Brother    Coronary artery disease Other    Diabetes Other     Past Surgical History:  Procedure Laterality Date   AMPUTATION Right 09/29/2017   Procedure: AMPUTATION RIGHT 1ST RAY;  Surgeon: Leandrew Koyanagi, MD;  Location: [facility];  Service: Orthopedics;  Laterality: Right;   AMPUTATION Right 01/29/2019   Procedure: RIGHT TRANSMETATARSAL AMPUTATION;  Surgeon: Newt Minion, MD;  Location: [facility];  Service: Orthopedics;  Laterality: Right;   AMPUTATION TOE Right 04/24/2018   Procedure: RIGHT 2ND TOE PROXIMAL INTERPHALANGEAL JOINT DISARTICULATION;  Surgeon: Leandrew Koyanagi, MD;  Location: [facility];  Service: Orthopedics;  Laterality: Right;   APPLICATION OF WOUND VAC Right 09/29/2017   "foot"   BLADDER SUSPENSION  07/13/2020   Dr Zigmund Daniel    COLONOSCOPY     I & D EXTREMITY Right 08/10/2017   Procedure: IRRIGATION AND DEBRIDEMENT FOOT;  Surgeon: Leandrew Koyanagi, MD;  Location: [facility] [facility];  Service: Orthopedics;  Laterality: Right;   LUMBAR DISC SURGERY  1992   POSTERIOR LUMBAR FUSION  1996   TOE SURGERY Right 1980s   bone removed "inbetween my toes"   TOTAL HIP ARTHROPLASTY Left 11/16/2018   Procedure: LEFT TOTAL HIP ARTHROPLASTY ANTERIOR APPROACH;  Surgeon: Leandrew Koyanagi, MD;  Location: [facility];  Service: Orthopedics;  Laterality: Left;   VAGINAL HYSTERECTOMY     Social History   Occupational History   Not on file  Tobacco Use   Smoking status: Never   Smokeless tobacco: Never  Vaping Use   Vaping Use: Never used  Substance and Sexual Activity   Alcohol use: Never   Drug use: Never   Sexual activity: Not Currently    Partners: Male

## 2020-11-02 ENCOUNTER — Ambulatory Visit: Payer: Medicare Other | Admitting: Orthopedic Surgery

## 2020-11-13 ENCOUNTER — Encounter: Payer: Self-pay | Admitting: Gastroenterology

## 2020-11-15 ENCOUNTER — Ambulatory Visit (INDEPENDENT_AMBULATORY_CARE_PROVIDER_SITE_OTHER): Payer: Medicare Other | Admitting: Physician Assistant

## 2020-11-15 ENCOUNTER — Encounter: Payer: Self-pay | Admitting: Physician Assistant

## 2020-11-15 DIAGNOSIS — L03115 Cellulitis of right lower limb: Secondary | ICD-10-CM

## 2020-11-15 NOTE — Progress Notes (Signed)
Office Visit Note   Patient: Brittany [name]           Date of Birth: 1945/07/24           MRN: [id number] Visit Date: 11/15/2020              Requested by: Carollee Herter, [place], [place] [address] [place],  [place] [address] PCP: Carollee Herter, Alferd Apa, DO  Chief Complaint  Patient presents with   Right Leg - Follow-up   Left Leg - Follow-up      HPI: Patient presents in follow-up today.  For her right leg cellulitis.  Also ulcer beneath her left forefoot.  She has been on antibiotics and finished that yesterday.  She feels is significantly less erythematous.  Less painful and less swollen.  She just noticed that it is discolored.  She also has some mild redness in her other leg with a tan line on her sock.  She says she has been out in the sun and also admits that she is on medication that she is not supposed to have sun exposure  Assessment & Plan: Visit Diagnoses: No diagnosis found.  Plan: Patient will follow-up in 2 weeks if she has recurrence of any erythema she is to follow-up sooner and call us.  With regards to her left foot she is doing much better nothing to debride today would anticipate she can go into a supportive shoe in 2 weeks  Follow-Up Instructions: No follow-ups on file.   Ortho Exam  Patient is alert, oriented, no adenopathy, well-dressed, normal affect, normal respiratory effort. Right lower extremity: Compartments are soft and nontender she has a tan line at her ankle.  Mild erythema but is not much different than her other leg.  Skin is in good condition.  No ascending cellulitis.  Left foot she has a very small thin callus beneath the forefoot.  No drainage no surrounding erythema no cellulitis or signs of infection  Imaging: No results found. No images are attached to the encounter.  Labs: Lab Results  Component Value Date   HGBA1C 6.3 09/01/2020   HGBA1C 6.4 (H) 12/27/2019   HGBA1C 6.5 06/25/2019   REPTSTATUS 08/15/2017 FINAL  08/10/2017   GRAMSTAIN NO WBC SEEN NO ORGANISMS SEEN  08/10/2017   CULT  08/10/2017    No growth aerobically or anaerobically. Performed at [facility], [place] [address] [place], [place] [address]    Doy Hutching  03/11/2016    Three or more organisms present,each greater than 10,000 CFU/mL.These organisms,commonly found on external and internal genitalia,are considered to be colonizers.No further testing performed.      Lab Results  Component Value Date   ALBUMIN 3.8 09/01/2020   ALBUMIN 3.8 10/26/2019   ALBUMIN 3.5 06/25/2019    No results found for: MG No results found for: VD25OH  No results found for: PREALBUMIN CBC EXTENDED Latest Ref Rng & Units 10/26/2019 01/29/2019 11/18/2018  WBC 4.0 - 10.5 K/uL 9.4 10.6(H) 14.0(H)  RBC 3.87 - 5.11 Mil/uL 4.41 4.55 3.34(L)  HGB 12.0 - 15.0 g/dL 12.6 12.2 9.6(L)  HCT 36.0 - 46.0 % 37.2 39.6 28.8(L)  PLT 150.0 - 400.0 K/uL 237.0 244 164  NEUTROABS 1.4 - 7.7 K/uL 6.5 - -  LYMPHSABS 0.7 - 4.0 K/uL 2.0 - -     There is no height or weight on file to calculate BMI.  Orders:  No orders of the defined types were placed in this  encounter.  No orders of the defined types were placed in this encounter.    Procedures: No procedures performed  Clinical Data: No additional findings.  ROS:  All other systems negative, except as noted in the HPI. Review of Systems  Objective: Vital Signs: There were no vitals taken for this visit.  Specialty Comments:  No specialty comments available.  PMFS History: Patient Active Problem List   Diagnosis Date Noted   Abdominal bloating 10/26/2019   Ulcer of toe of left foot, limited to breakdown of skin ([place]) 07/13/2019   Status post total replacement of left hip 11/16/2018   Vaginal prolapse 09/30/2018   Primary osteoarthritis of left hip 06/10/2018   Osteomyelitis of second toe of right foot ([place]) 04/24/2018   Osteomyelitis of right foot ([place]) 04/21/2018   Non-pressure chronic  ulcer of right lower leg (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 ([place]) 10/14/2017   Osteomyelitis of great toe of right foot ([place]) 09/25/2017   Pain in right foot 09/16/2017   Cellulitis 08/08/2017   Hypoglycemia without diagnosis of diabetes mellitus 08/08/2017   Peripheral neuropathy 08/08/2017   Abscess 08/08/2017   Hyperlipidemia LDL goal <70 09/22/2016   Tooth pain 09/03/2012   Edema 09/03/2012   Supraclavicular fossa fullness 06/18/2012   Hair loss 09/18/2010   Fatigue 09/18/2010   DYSPEPSIA&OTHER SPEC DISORDERS FUNCTION STOMACH 05/08/2010   DIARRHEA 05/08/2010   DIZZINESS 04/19/2010   OTHER ACUTE SINUSITIS 02/28/2010   NAUSEA 02/28/2010   Pain in right ankle and joints of right foot 02/15/2010   URI 07/09/2007   GERD 02/12/2007   LOW BACK PAIN 02/12/2007   Hypertension 09/03/2006   HOT FLASHES 09/03/2006   INSOMNIA 09/03/2006   Past Medical History:  Diagnosis Date   Anemia    as a younger woman   Arthritis    "all over my body" (10/01/2017)   Chronic pain    In pain clinic    Constipation    Depression    Diabetic peripheral neuropathy (HCC)    GERD (gastroesophageal reflux disease)    High cholesterol    History of hiatal hernia    Hypertension    Type II diabetes mellitus ([place]) dx'd ~ 2008    Family History  Problem Relation Age of Onset   Cancer Mother        breast   Depression Mother        bipolar   Breast cancer Mother 6   Heart disease Father 2       MI   Diabetes Brother    Kidney disease Brother    Hypertension Brother    Hyperlipidemia Brother    Stroke Brother    Coronary artery disease Other    Diabetes Other     Past Surgical History:   Procedure Laterality Date   AMPUTATION Right 09/29/2017   Procedure: AMPUTATION RIGHT 1ST RAY;  Surgeon: Leandrew Koyanagi, MD;  Location: [facility];  Service: Orthopedics;  Laterality: Right;   AMPUTATION Right 01/29/2019   Procedure: RIGHT TRANSMETATARSAL AMPUTATION;  Surgeon: Newt Minion, MD;  Location: [facility];  Service: Orthopedics;  Laterality: Right;   AMPUTATION TOE Right 04/24/2018   Procedure: RIGHT 2ND TOE PROXIMAL INTERPHALANGEAL JOINT DISARTICULATION;  Surgeon: Leandrew Koyanagi, MD;  Location: [facility];  Service: Orthopedics;  Laterality: Right;   APPLICATION OF WOUND VAC Right 09/29/2017   "foot"   BLADDER SUSPENSION  07/13/2020   Dr Zigmund Daniel    COLONOSCOPY     I & D EXTREMITY Right 08/10/2017   Procedure: IRRIGATION AND DEBRIDEMENT FOOT;  Surgeon: Leandrew Koyanagi, MD;  Location: [facility];  Service: Orthopedics;  Laterality: Right;   LUMBAR DISC SURGERY  1992   POSTERIOR LUMBAR FUSION  1996   TOE SURGERY Right 1980s   bone removed "inbetween my toes"   TOTAL HIP ARTHROPLASTY Left 11/16/2018   Procedure: LEFT TOTAL HIP ARTHROPLASTY ANTERIOR APPROACH;  Surgeon: Leandrew Koyanagi, MD;  Location: [facility];  Service: Orthopedics;  Laterality: Left;   VAGINAL HYSTERECTOMY     Social History   Occupational History   Not on file  Tobacco Use   Smoking status: Never   Smokeless tobacco: Never  Vaping Use   Vaping Use: Never used  Substance and Sexual Activity   Alcohol use: Never   Drug use: Never   Sexual activity: Not Currently    Partners: Male

## 2020-11-29 ENCOUNTER — Ambulatory Visit: Payer: Medicare Other | Admitting: Physician Assistant

## 2020-11-29 ENCOUNTER — Encounter: Payer: Self-pay | Admitting: Physician Assistant

## 2020-11-29 DIAGNOSIS — N816 Rectocele: Secondary | ICD-10-CM | POA: Diagnosis not present

## 2020-11-29 DIAGNOSIS — K5901 Slow transit constipation: Secondary | ICD-10-CM | POA: Diagnosis not present

## 2020-11-29 DIAGNOSIS — M86171 Other acute osteomyelitis, right ankle and foot: Secondary | ICD-10-CM | POA: Diagnosis not present

## 2020-11-29 MED ORDER — CEPHALEXIN 500 MG PO CAPS: 500.0000 mg | ORAL_CAPSULE | Freq: Three times a day (TID) | ORAL | 0 refills | Status: DC

## 2020-11-29 NOTE — Progress Notes (Addendum)
Office Visit Note   Patient: Brittany [name]           Date of Birth: 1945/07/03           MRN: [id number] Visit Date: 11/29/2020              Requested by: [address] [place], [place] [address] [place],  [place] [address] PCP: Carollee Herter, Alferd Apa, DO  No chief complaint on file.     HPI: Patient is a pleasant 75 year old woman with insensate diabetic neuropathy.  She comes in for 2 issues today.  She is following up on her right leg cellulitis.  She was placed on Keflex and this resolved her cellulitis.  She most recently got a cut on the front of her ankle from wearing ill fitting shoes.  She now has redness again in the leg.  She denies any fever chills or malaise.  She also comes in for her ulcer beneath the left forefoot she gets a little bit of drainage of it she is wearing a felt relieving pad in a postop shoe  Assessment & Plan: Visit Diagnoses: No diagnosis found.  Plan: We will restart her Keflex.  Also given her compression stocking to wear over the small wound.  She will follow-up with Dr. Sharol Given next week.  After verbal consent the ulcer was trimmed to a softer surface  Follow-Up Instructions: No follow-ups on file.   Ortho Exam  Patient is alert, oriented, no adenopathy, well-dressed, normal affect, normal respiratory effort. Examination the right lower extremity status post transmetatarsal amputation.  She does have a 1 cm grade 1 ulcer across the crease in her ankle.  There is no surrounding erythema but she does have cellulitis in the distal tibia this does not track proximally.  Compartments are soft  She has no signs of necrosis or other skin breakdown.  On the left foot she has a ulcer beneath the metatarsal head.  This has some minimal drainage no foul odor no surrounding cellulitis or infection after verbal consent this was trimmed to a healthier edges touched with silver nitrate stick  Imaging: No results found. No images are attached to the  encounter.  Labs: Lab Results  Component Value Date   HGBA1C 6.3 09/01/2020   HGBA1C 6.4 (H) 12/27/2019   HGBA1C 6.5 06/25/2019   REPTSTATUS 08/15/2017 FINAL 08/10/2017   GRAMSTAIN NO WBC SEEN NO ORGANISMS SEEN  08/10/2017   CULT  08/10/2017    No growth aerobically or anaerobically. Performed at [facility], [place] [address] [place], [place] [address]    Doy Hutching  03/11/2016    Three or more organisms present,each greater than 10,000 CFU/mL.These organisms,commonly found on external and internal genitalia,are considered to be colonizers.No further testing performed.      Lab Results  Component Value Date   ALBUMIN 3.8 09/01/2020   ALBUMIN 3.8 10/26/2019   ALBUMIN 3.5 06/25/2019    No results found for: MG No results found for: VD25OH  No results found for: PREALBUMIN CBC EXTENDED Latest Ref Rng & Units 10/26/2019 01/29/2019 11/18/2018  WBC 4.0 - 10.5 K/uL 9.4 10.6(H) 14.0(H)  RBC 3.87 - 5.11 Mil/uL 4.41 4.55 3.34(L)  HGB 12.0 - 15.0 g/dL 12.6 12.2 9.6(L)  HCT 36.0 - 46.0 % 37.2 39.6 28.8(L)  PLT 150.0 - 400.0 K/uL 237.0 244 164  NEUTROABS 1.4 - 7.7 K/uL 6.5 - -  LYMPHSABS 0.7 - 4.0 K/uL 2.0 - -  There is no height or weight on file to calculate BMI.  Orders:  No orders of the defined types were placed in this encounter.  Meds ordered this encounter  Medications   cephALEXin (KEFLEX) 500 MG capsule    Sig: Take 1 capsule (500 mg total) by mouth 3 (three) times daily.    Dispense:  30 capsule    Refill:  0     Procedures: No procedures performed  Clinical Data: No additional findings.  ROS:  All other systems negative, except as noted in the HPI. Review of Systems  Objective: Vital Signs: There were no vitals taken for this visit.  Specialty Comments:  No specialty comments available.  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/16/2018   Vaginal prolapse 09/30/2018   Primary osteoarthritis of left hip 06/10/2018   Osteomyelitis of second toe of right foot ([place]) 04/24/2018   Osteomyelitis of right foot ([place]) 04/21/2018   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 ([place]) 04/17/2018   Callus 01/13/2018   Generalized anxiety disorder 10/16/2017   Menopause 10/16/2017   DM (diabetes mellitus) type II uncontrolled, periph vascular disorder ([place]) 10/16/2017   Hyperlipidemia associated with type 2 diabetes mellitus ([place]) 10/16/2017   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 ([place]) 10/14/2017   Osteomyelitis of great toe of right foot ([place]) 09/25/2017   Pain in right foot 09/16/2017   Cellulitis 08/08/2017   Hypoglycemia without diagnosis of diabetes mellitus 08/08/2017   Peripheral neuropathy 08/08/2017   Abscess 08/08/2017   Hyperlipidemia LDL goal <70 09/22/2016   Tooth pain 09/03/2012   Edema 09/03/2012   Supraclavicular fossa fullness 06/18/2012   Hair loss 09/18/2010   Fatigue 09/18/2010   DYSPEPSIA&OTHER SPEC DISORDERS FUNCTION STOMACH 05/08/2010   DIARRHEA 05/08/2010   DIZZINESS 04/19/2010   OTHER ACUTE SINUSITIS 02/28/2010   NAUSEA 02/28/2010   Pain in right ankle and joints of right foot 02/15/2010   URI 07/09/2007   GERD 02/12/2007   LOW BACK PAIN 02/12/2007   Hypertension 09/03/2006   HOT FLASHES 09/03/2006   INSOMNIA 09/03/2006   Past Medical History:  Diagnosis Date   Anemia    as a younger woman   Arthritis    "all over my body" (10/01/2017)   Chronic pain    In pain clinic    Constipation    Depression    Diabetic peripheral neuropathy (HCC)    GERD (gastroesophageal reflux disease)    High cholesterol    History of hiatal hernia    Hypertension    Type II diabetes mellitus ([place]) dx'd ~ 2008    Family History  Problem Relation Age of Onset   Cancer Mother        breast   Depression  Mother        bipolar   Breast cancer Mother 41   Heart disease Father 52       MI   Diabetes Brother    Kidney disease Brother    Hypertension Brother    Hyperlipidemia Brother    Stroke Brother    Coronary artery disease Other    Diabetes Other     Past Surgical History:  Procedure Laterality Date   AMPUTATION Right 09/29/2017   Procedure: AMPUTATION RIGHT 1ST RAY;  Surgeon: Erlinda Hong, Georga Kaufmann  M, MD;  Location: [facility];  Service: Orthopedics;  Laterality: Right;   AMPUTATION Right 01/29/2019   Procedure: RIGHT TRANSMETATARSAL AMPUTATION;  Surgeon: Newt Minion, MD;  Location: [facility];  Service: Orthopedics;  Laterality: Right;   AMPUTATION TOE Right 04/24/2018   Procedure: RIGHT 2ND TOE PROXIMAL INTERPHALANGEAL JOINT DISARTICULATION;  Surgeon: Leandrew Koyanagi, MD;  Location: [facility];  Service: Orthopedics;  Laterality: Right;   APPLICATION OF WOUND VAC Right 09/29/2017   "foot"   BLADDER SUSPENSION  07/13/2020   Dr Zigmund Daniel    COLONOSCOPY     I & D EXTREMITY Right 08/10/2017   Procedure: IRRIGATION AND DEBRIDEMENT FOOT;  Surgeon: Leandrew Koyanagi, MD;  Location: [facility];  Service: Orthopedics;  Laterality: Right;   LUMBAR DISC SURGERY  1992   POSTERIOR LUMBAR FUSION  1996   TOE SURGERY Right 1980s   bone removed "inbetween my toes"   TOTAL HIP ARTHROPLASTY Left 11/16/2018   Procedure: LEFT TOTAL HIP ARTHROPLASTY ANTERIOR APPROACH;  Surgeon: Leandrew Koyanagi, MD;  Location: [facility];  Service: Orthopedics;  Laterality: Left;   VAGINAL HYSTERECTOMY     Social History   Occupational History   Not on file  Tobacco Use   Smoking status: Never   Smokeless tobacco: Never  Vaping Use   Vaping Use: Never used  Substance and Sexual Activity   Alcohol use: Never   Drug use: Never   Sexual activity: Not Currently    Partners: Male

## 2020-12-04 ENCOUNTER — Other Ambulatory Visit: Payer: Self-pay | Admitting: Family Medicine

## 2020-12-05 ENCOUNTER — Ambulatory Visit: Payer: Medicare Other | Admitting: Orthopedic Surgery

## 2020-12-07 ENCOUNTER — Ambulatory Visit (INDEPENDENT_AMBULATORY_CARE_PROVIDER_SITE_OTHER): Payer: Medicare Other | Admitting: Orthopedic Surgery

## 2020-12-07 ENCOUNTER — Other Ambulatory Visit: Payer: Self-pay

## 2020-12-07 DIAGNOSIS — L97521 Non-pressure chronic ulcer of other part of left foot limited to breakdown of skin: Secondary | ICD-10-CM | POA: Diagnosis not present

## 2020-12-12 ENCOUNTER — Encounter: Payer: Self-pay | Admitting: Orthopedic Surgery

## 2020-12-12 NOTE — Progress Notes (Signed)
Office Visit Note   Patient: Brittany [name]           Date of Birth: [birth year]           MRN: [id number] Visit Date: 12/07/2020              Requested by: [address], [place], [place] [address] [place],  [place] [address] PCP: Carollee Herter, Alferd Apa, DO  Chief Complaint  Patient presents with   Right Leg - Follow-up   Left Foot - Follow-up      HPI: Patient is a 75 year old woman who presents follow-up for right lower extremity cellulitis she currently has a pressure relieving donut in her left shoe and is wearing compression stockings she has completed a course of Keflex.  Assessment & Plan: Visit Diagnoses:  1. Ulcer of toe of left foot, limited to breakdown of skin ([place])     Plan: Patient underwent debridement of the plantar ulcer left foot a new pressure relieving donut was placed in her shoe.  Follow-Up Instructions: Return in about 4 weeks (around 01/04/2021).   Ortho Exam  Patient is alert, oriented, no adenopathy, well-dressed, normal affect, normal respiratory effort. Examination patient has good pulses.  The right transmetatarsal amputation is well-healed left foot she has a Wagner grade 1 ulcer that is 5 mm in diameter.  After informed consent a 10 blade knife was used to debride the skin and soft tissue back to healthy viable granulation tissue the ulcer was 10 mm in diameter and 3 mm deep after debridement.  Silver nitrate was used for hemostasis.  A new felt relieving donut was placed in a postoperative shoe.  Imaging: No results found. No images are attached to the encounter.  Labs: Lab Results  Component Value Date   HGBA1C 6.3 09/01/2020   HGBA1C 6.4 (H) 12/27/2019   HGBA1C 6.5 06/25/2019   REPTSTATUS 08/15/2017 FINAL 08/10/2017   GRAMSTAIN NO WBC SEEN NO ORGANISMS SEEN  08/10/2017   CULT  08/10/2017    No growth aerobically or anaerobically. Performed at [facility], [place] [address], [place], [place] [address]    Doy Hutching   03/11/2016    Three or more organisms present,each greater than 10,000 CFU/mL.These organisms,commonly found on external and internal genitalia,are considered to be colonizers.No further testing performed.      Lab Results  Component Value Date   ALBUMIN 3.8 09/01/2020   ALBUMIN 3.8 10/26/2019   ALBUMIN 3.5 06/25/2019    No results found for: MG No results found for: VD25OH  No results found for: PREALBUMIN CBC EXTENDED Latest Ref Rng & Units 10/26/2019 01/29/2019 11/18/2018  WBC 4.0 - 10.5 K/uL 9.4 10.6(H) 14.0(H)  RBC 3.87 - 5.11 Mil/uL 4.41 4.55 3.34(L)  HGB 12.0 - 15.0 g/dL 12.6 12.2 9.6(L)  HCT 36.0 - 46.0 % 37.2 39.6 28.8(L)  PLT 150.0 - 400.0 K/uL 237.0 244 164  NEUTROABS 1.4 - 7.7 K/uL 6.5 - -  LYMPHSABS 0.7 - 4.0 K/uL 2.0 - -     There is no height or weight on file to calculate BMI.  Orders:  No orders of the defined types were placed in this encounter.  No orders of the defined types were placed in this encounter.    Procedures: No procedures performed  Clinical Data: No additional findings.  ROS:  All other systems negative, except as noted in the HPI. Review of Systems  Objective: Vital Signs: There were no vitals taken for  this visit.  Specialty Comments:  No specialty comments available.  PMFS History: Patient Active Problem List   Diagnosis Date Noted   Abdominal bloating 10/26/2019   Ulcer of toe of left foot, limited to breakdown of skin ([place]) 07/13/2019   Status post total replacement of left hip 11/16/2018   Vaginal prolapse 09/30/2018   Primary osteoarthritis of left hip 06/10/2018   Osteomyelitis of second toe of right foot ([place]) 04/24/2018   Osteomyelitis of right foot ([place]) 04/21/2018   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 ([place]) 04/17/2018   Callus 01/13/2018   Generalized anxiety disorder 10/16/2017   Menopause 10/16/2017   DM (diabetes mellitus) type II uncontrolled, periph vascular disorder ([place]) 10/16/2017    Hyperlipidemia associated with type 2 diabetes mellitus ([place]) 10/16/2017   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 ([place]) 10/14/2017   Osteomyelitis of great toe of right foot ([place]) 09/25/2017   Pain in right foot 09/16/2017   Cellulitis 08/08/2017   Hypoglycemia without diagnosis of diabetes mellitus 08/08/2017   Peripheral neuropathy 08/08/2017   Abscess 08/08/2017   Hyperlipidemia LDL goal <70 09/22/2016   Tooth pain 09/03/2012   Edema 09/03/2012   Supraclavicular fossa fullness 06/18/2012   Hair loss 09/18/2010   Fatigue 09/18/2010   DYSPEPSIA&OTHER SPEC DISORDERS FUNCTION STOMACH 05/08/2010   DIARRHEA 05/08/2010   DIZZINESS 04/19/2010   OTHER ACUTE SINUSITIS 02/28/2010   NAUSEA 02/28/2010   Pain in right ankle and joints of right foot 02/15/2010   URI 07/09/2007   GERD 02/12/2007   LOW BACK PAIN 02/12/2007   Hypertension 09/03/2006   HOT FLASHES 09/03/2006   INSOMNIA 09/03/2006   Past Medical History:  Diagnosis Date   Anemia    as a younger woman   Arthritis    "all over my body" (10/01/2017)   Chronic pain    In pain clinic    Constipation    Depression    Diabetic peripheral neuropathy (HCC)    GERD (gastroesophageal reflux disease)    High cholesterol    History of hiatal hernia    Hypertension    Type II diabetes mellitus ([place]) dx'd ~ 2008    Family History  Problem Relation Age of Onset   Cancer Mother        breast   Depression Mother        bipolar   Breast cancer Mother 56   Heart disease Father 82       MI   Diabetes Brother    Kidney disease Brother    Hypertension Brother    Hyperlipidemia Brother    Stroke Brother    Coronary artery disease Other    Diabetes Other     Past Surgical History:  Procedure Laterality Date   AMPUTATION Right 09/29/2017   Procedure: AMPUTATION RIGHT 1ST RAY;  Surgeon: Leandrew Koyanagi, MD;  Location: [facility];  Service: Orthopedics;  Laterality: Right;   AMPUTATION Right 01/29/2019   Procedure:  RIGHT TRANSMETATARSAL AMPUTATION;  Surgeon: Newt Minion, MD;  Location: [facility];  Service: Orthopedics;  Laterality: Right;   AMPUTATION TOE Right 04/24/2018   Procedure: RIGHT 2ND TOE PROXIMAL INTERPHALANGEAL JOINT DISARTICULATION;  Surgeon: Leandrew Koyanagi, MD;  Location: [facility];  Service: Orthopedics;  Laterality: Right;   APPLICATION OF WOUND VAC Right 09/29/2017   "foot"   BLADDER SUSPENSION  07/13/2020   Dr Zigmund Daniel    COLONOSCOPY     I & D EXTREMITY Right 08/10/2017  Procedure: IRRIGATION AND DEBRIDEMENT FOOT;  Surgeon: Leandrew Koyanagi, MD;  Location: [facility];  Service: Orthopedics;  Laterality: Right;   LUMBAR DISC SURGERY  1992   POSTERIOR LUMBAR FUSION  1996   TOE SURGERY Right 1980s   bone removed "inbetween my toes"   TOTAL HIP ARTHROPLASTY Left 11/16/2018   Procedure: LEFT TOTAL HIP ARTHROPLASTY ANTERIOR APPROACH;  Surgeon: Leandrew Koyanagi, MD;  Location: [facility];  Service: Orthopedics;  Laterality: Left;   VAGINAL HYSTERECTOMY     Social History   Occupational History   Not on file  Tobacco Use   Smoking status: Never   Smokeless tobacco: Never  Vaping Use   Vaping Use: Never used  Substance and Sexual Activity   Alcohol use: Never   Drug use: Never   Sexual activity: Not Currently    Partners: Male

## 2020-12-15 ENCOUNTER — Telehealth: Payer: Self-pay | Admitting: Family Medicine

## 2020-12-15 NOTE — Telephone Encounter (Signed)
Left message for patient to call back and schedule Medicare Annual Wellness Visit (AWV) in office.   If not able to come in office, please offer to do virtually or by telephone.  Left office number and my jabber [phone].  Last AWV:02/19/2016  Please schedule at anytime with Nurse Health Advisor.

## 2020-12-20 IMAGING — RF OPERATIVE LEFT HIP WITH PELVIS
1 series · 7 of 7 positions shown · non-contrast
Comparison: Left hip radiographs 06/10/2018

CLINICAL DATA: Left hip replacement.

EXAM:
OPERATIVE LEFT HIP (WITH PELVIS IF PERFORMED) 7 VIEWS
TECHNIQUE: Fluoroscopic spot image(s) were submitted for interpretation
post-operatively.
FLUOROSCOPY TIME:  Fluoroscopy Time: 1 minute 44 seconds
Radiation Exposure Index: 11.001 mGy

[Series 1: unknown protocol · 0.20mm/px · 7 of 7 slices shown]
[im 1/7]
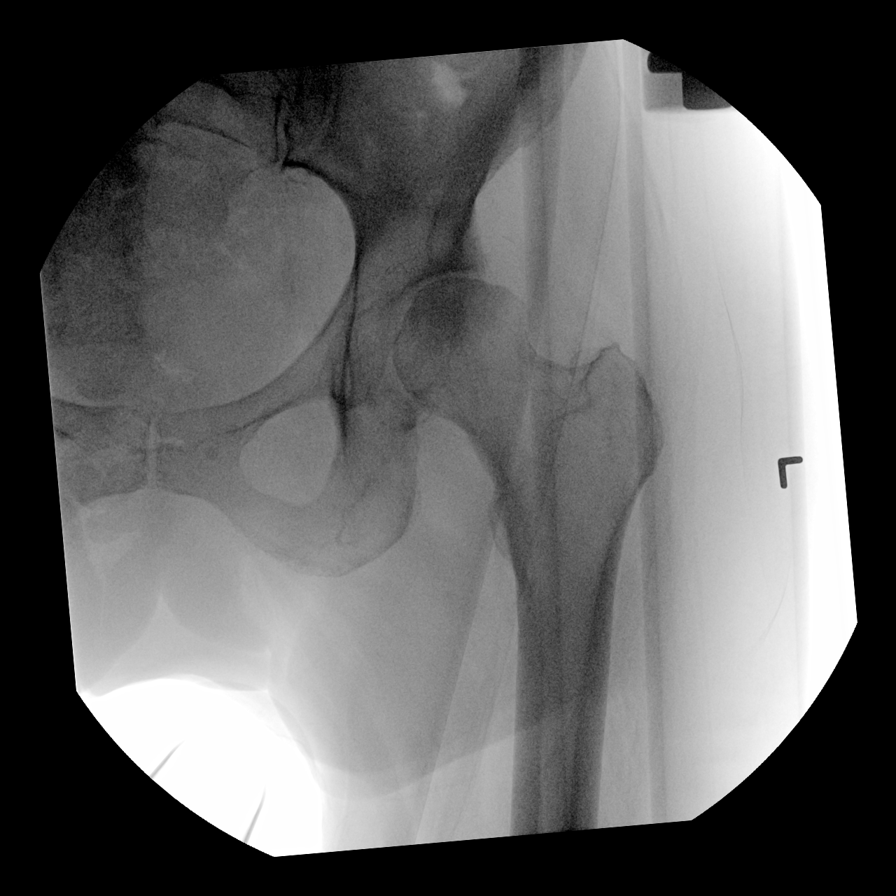
[im 2/7]
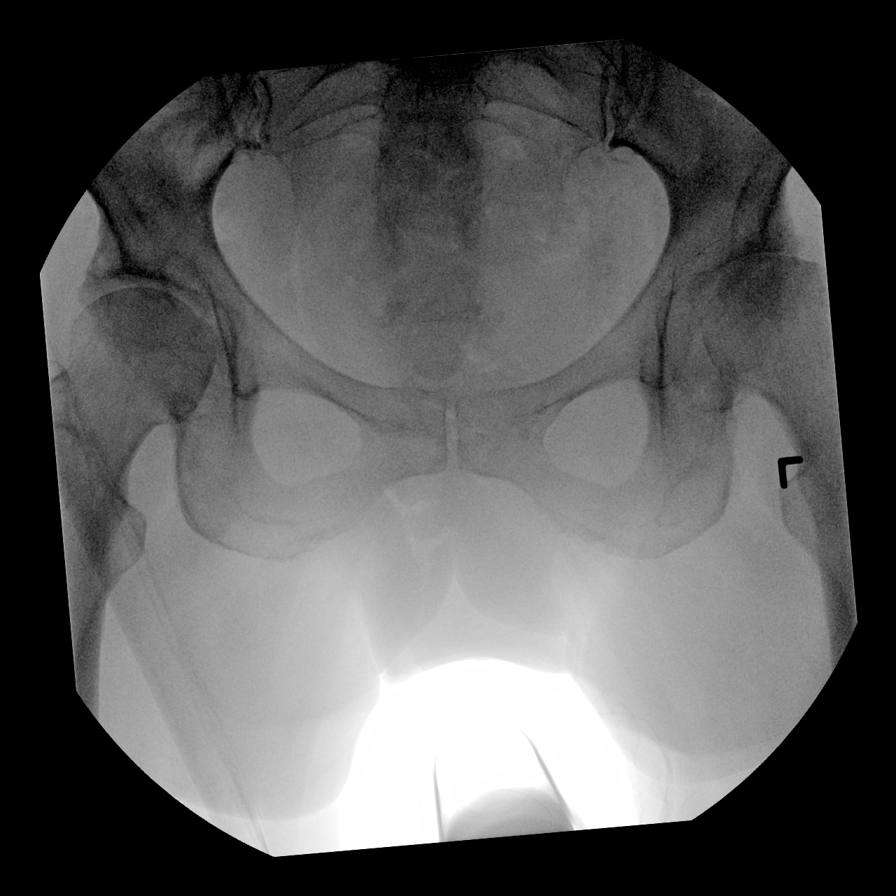
[im 3/7]
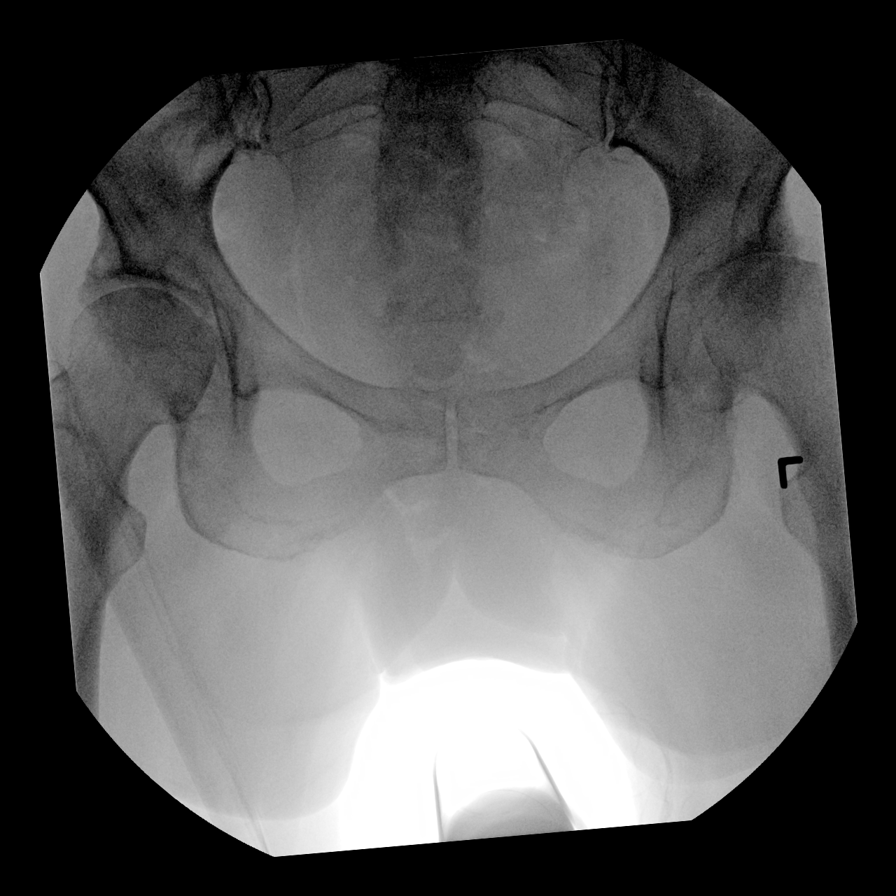
[im 4/7]
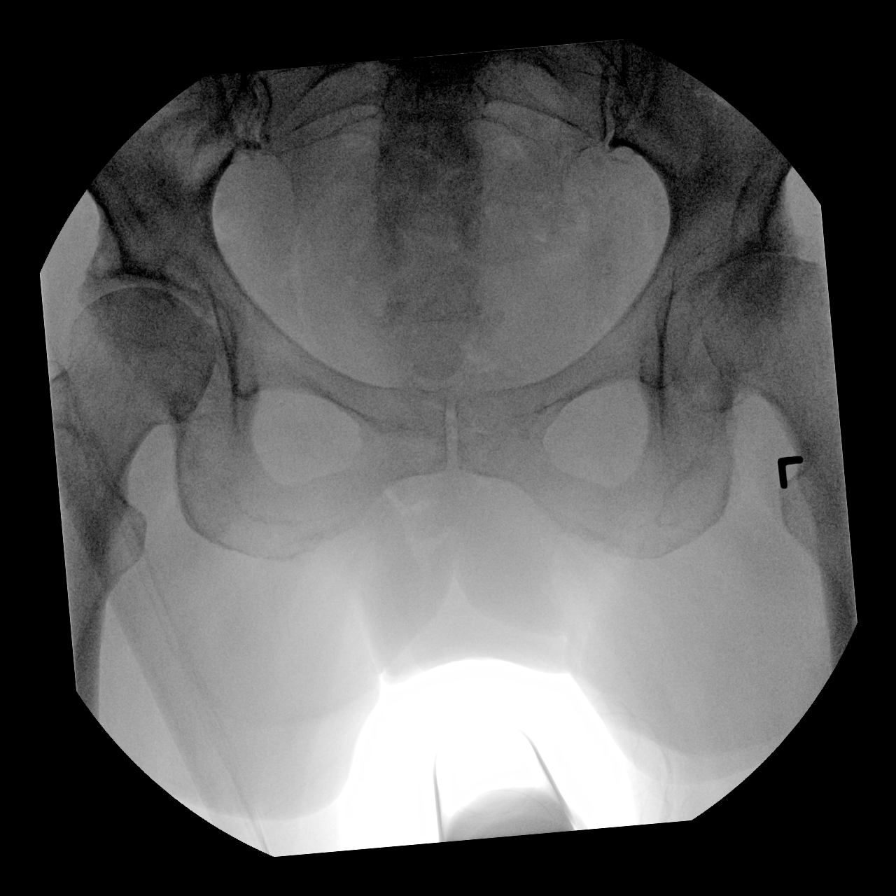
[im 5/7]
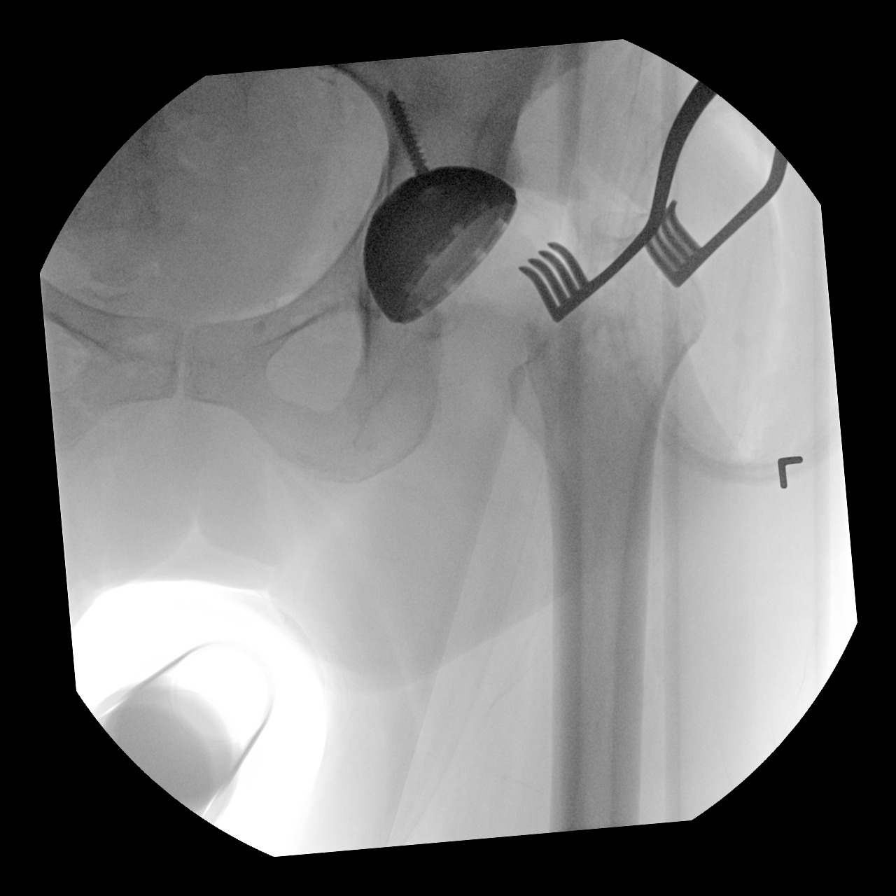
[im 6/7]
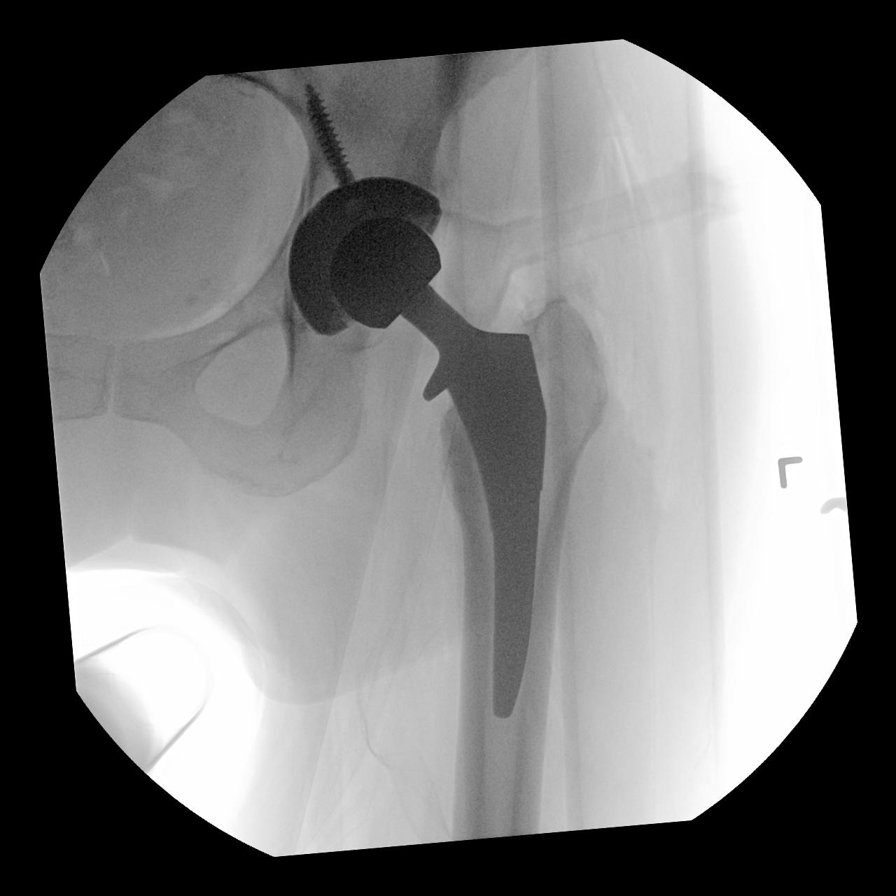
[im 7/7]
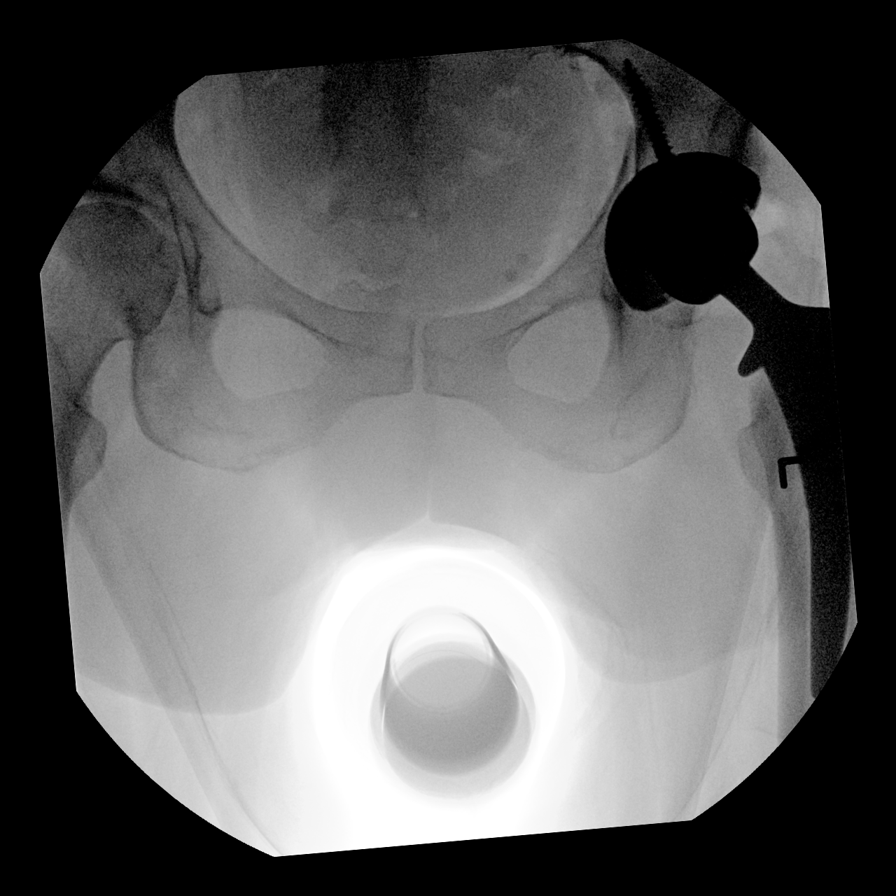

[7 of 7 positions shown; findings below may reference images not displayed]

FINDINGS: Seven intraoperative fluoroscopic images are provided with the
initial images again demonstrating advanced left hip osteoarthrosis.
Subsequent images demonstrate performance of a left total hip
arthroplasty with the prosthetic femoral and acetabular components
appearing normally positioned on the final images. No acute fracture
is identified on these limited fluoroscopic images.
IMPRESSION: Intraoperative images during left total hip arthroplasty.

## 2021-01-04 ENCOUNTER — Encounter: Payer: Self-pay | Admitting: Orthopedic Surgery

## 2021-01-04 ENCOUNTER — Ambulatory Visit: Payer: Medicare Other | Admitting: Physician Assistant

## 2021-01-04 ENCOUNTER — Other Ambulatory Visit: Payer: Self-pay

## 2021-01-04 DIAGNOSIS — L97521 Non-pressure chronic ulcer of other part of left foot limited to breakdown of skin: Secondary | ICD-10-CM

## 2021-01-04 NOTE — Progress Notes (Signed)
Office Visit Note   Patient: Brittany [name]           Date of Birth: 06/25/1945           MRN: [id number] Visit Date: 01/04/2021              Requested by: Carollee Herter, [place], [place] [address] [place],  [place] [address] PCP: Carollee Herter, Alferd Apa, DO  Chief Complaint  Patient presents with   Left Foot - Follow-up    Toe ulcer      HPI: Patient follows up for her bilateral feet.  On the right she has a transmetatarsal amputation.  She had a small ulcer on the crease of her ankle.  This is now resolved.  She also comes in for trimming of a thickened callus on the medial aspect of her left foot she gets just a small amount of drainage she looks at her foot every day she thinks it is about the same  Assessment & Plan: Visit Diagnoses: No diagnosis found.  Plan: Continue to encourage her to look at her feet every day.  New padding in her shoes and follow-up in 4 weeks or sooner if any concerns  Follow-Up Instructions: No follow-ups on file.   Ortho Exam  Patient is alert, oriented, no adenopathy, well-dressed, normal affect, normal respiratory effort. Examination demonstrates right transmetatarsal amputation stump with resolved ulcer.  No swelling no drainage no cellulitis.  On the left foot she has a medial ulcer on the plantar surface.  There is no surrounding cellulitis.  Minimal swelling after verbal consent was obtained to this was debrided to healthy soft surfaces.  There is no drainage no fluctuance  Imaging: No results found. No images are attached to the encounter.  Labs: Lab Results  Component Value Date   HGBA1C 6.3 09/01/2020   HGBA1C 6.4 (H) 12/27/2019   HGBA1C 6.5 06/25/2019   REPTSTATUS 08/15/2017 FINAL 08/10/2017   GRAMSTAIN NO WBC SEEN NO ORGANISMS SEEN  08/10/2017   CULT  08/10/2017    No growth aerobically or anaerobically. Performed at [facility], [place] [address], [place], [place] [address]    Doy Hutching  03/11/2016    Three  or more organisms present,each greater than 10,000 CFU/mL.These organisms,commonly found on external and internal genitalia,are considered to be colonizers.No further testing performed.      Lab Results  Component Value Date   ALBUMIN 3.8 09/01/2020   ALBUMIN 3.8 10/26/2019   ALBUMIN 3.5 06/25/2019    No results found for: MG No results found for: VD25OH  No results found for: PREALBUMIN CBC EXTENDED Latest Ref Rng & Units 10/26/2019 01/29/2019 11/18/2018  WBC 4.0 - 10.5 K/uL 9.4 10.6(H) 14.0(H)  RBC 3.87 - 5.11 Mil/uL 4.41 4.55 3.34(L)  HGB 12.0 - 15.0 g/dL 12.6 12.2 9.6(L)  HCT 36.0 - 46.0 % 37.2 39.6 28.8(L)  PLT 150.0 - 400.0 K/uL 237.0 244 164  NEUTROABS 1.4 - 7.7 K/uL 6.5 - -  LYMPHSABS 0.7 - 4.0 K/uL 2.0 - -     There is no height or weight on file to calculate BMI.  Orders:  No orders of the defined types were placed in this encounter.  No orders of the defined types were placed in this encounter.    Procedures: No procedures performed  Clinical Data: No additional findings.  ROS:  All other systems negative, except as noted in the HPI. Review of Systems  Objective: Vital Signs: There  were no vitals taken for this visit.  Specialty Comments:  No specialty comments available.  PMFS History: Patient Active Problem List   Diagnosis Date Noted   Abdominal bloating 10/26/2019   Ulcer of toe of left foot, limited to breakdown of skin ([place]) 07/13/2019   Status post total replacement of left hip 11/16/2018   Vaginal prolapse 09/30/2018   Primary osteoarthritis of left hip 06/10/2018   Osteomyelitis of second toe of right foot ([place]) 04/24/2018   Osteomyelitis of right foot ([place]) 04/21/2018   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 ([place]) 04/17/2018   Callus 01/13/2018   Generalized anxiety disorder 10/16/2017   Menopause 10/16/2017   DM (diabetes mellitus) type II uncontrolled, periph vascular disorder ([place]) 10/16/2017   Hyperlipidemia  associated with type 2 diabetes mellitus ([place]) 10/16/2017   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 ([place]) 10/14/2017   Osteomyelitis of great toe of right foot ([place]) 09/25/2017   Pain in right foot 09/16/2017   Cellulitis 08/08/2017   Hypoglycemia without diagnosis of diabetes mellitus 08/08/2017   Peripheral neuropathy 08/08/2017   Abscess 08/08/2017   Hyperlipidemia LDL goal <70 09/22/2016   Tooth pain 09/03/2012   Edema 09/03/2012   Supraclavicular fossa fullness 06/18/2012   Hair loss 09/18/2010   Fatigue 09/18/2010   DYSPEPSIA&OTHER SPEC DISORDERS FUNCTION STOMACH 05/08/2010   DIARRHEA 05/08/2010   DIZZINESS 04/19/2010   OTHER ACUTE SINUSITIS 02/28/2010   NAUSEA 02/28/2010   Pain in right ankle and joints of right foot 02/15/2010   URI 07/09/2007   GERD 02/12/2007   LOW BACK PAIN 02/12/2007   Hypertension 09/03/2006   HOT FLASHES 09/03/2006   INSOMNIA 09/03/2006   Past Medical History:  Diagnosis Date   Anemia    as a younger woman   Arthritis    "all over my body" (10/01/2017)   Chronic pain    In pain clinic    Constipation    Depression    Diabetic peripheral neuropathy (HCC)    GERD (gastroesophageal reflux disease)    High cholesterol    History of hiatal hernia    Hypertension    Type II diabetes mellitus ([place]) dx'd ~ 2008    Family History  Problem Relation Age of Onset   Cancer Mother        breast   Depression Mother        bipolar   Breast cancer Mother 35   Heart disease Father 72       MI   Diabetes Brother    Kidney disease Brother    Hypertension Brother    Hyperlipidemia Brother    Stroke Brother    Coronary artery disease Other    Diabetes Other     Past Surgical History:  Procedure Laterality Date   AMPUTATION Right 09/29/2017   Procedure: AMPUTATION RIGHT 1ST RAY;  Surgeon: Leandrew Koyanagi, MD;  Location: [facility];  Service: Orthopedics;  Laterality: Right;   AMPUTATION Right 01/29/2019   Procedure: RIGHT  TRANSMETATARSAL AMPUTATION;  Surgeon: Newt Minion, MD;  Location: [facility];  Service: Orthopedics;  Laterality: Right;   AMPUTATION TOE Right 04/24/2018   Procedure: RIGHT 2ND TOE PROXIMAL INTERPHALANGEAL JOINT DISARTICULATION;  Surgeon: Leandrew Koyanagi, MD;  Location: [facility];  Service: Orthopedics;  Laterality: Right;   APPLICATION OF WOUND VAC Right 09/29/2017   "foot"   BLADDER SUSPENSION  07/13/2020   Dr Zigmund Daniel    COLONOSCOPY     I &  D EXTREMITY Right 08/10/2017   Procedure: IRRIGATION AND DEBRIDEMENT FOOT;  Surgeon: Leandrew Koyanagi, MD;  Location: [facility];  Service: Orthopedics;  Laterality: Right;   LUMBAR DISC SURGERY  1992   POSTERIOR LUMBAR FUSION  1996   TOE SURGERY Right 1980s   bone removed "inbetween my toes"   TOTAL HIP ARTHROPLASTY Left 11/16/2018   Procedure: LEFT TOTAL HIP ARTHROPLASTY ANTERIOR APPROACH;  Surgeon: Leandrew Koyanagi, MD;  Location: [facility];  Service: Orthopedics;  Laterality: Left;   VAGINAL HYSTERECTOMY     Social History   Occupational History   Not on file  Tobacco Use   Smoking status: Never   Smokeless tobacco: Never  Vaping Use   Vaping Use: Never used  Substance and Sexual Activity   Alcohol use: Never   Drug use: Never   Sexual activity: Not Currently    Partners: Male

## 2021-01-15 ENCOUNTER — Encounter: Payer: Medicare Other | Admitting: Gastroenterology

## 2021-01-25 ENCOUNTER — Ambulatory Visit (INDEPENDENT_AMBULATORY_CARE_PROVIDER_SITE_OTHER): Payer: Medicare Other

## 2021-01-25 VITALS — Wt 185.0 lb

## 2021-01-25 DIAGNOSIS — Z0000 Encounter for general adult medical examination without abnormal findings: Secondary | ICD-10-CM | POA: Diagnosis not present

## 2021-01-25 DIAGNOSIS — Z780 Asymptomatic menopausal state: Secondary | ICD-10-CM | POA: Diagnosis not present

## 2021-01-25 DIAGNOSIS — Z1231 Encounter for screening mammogram for malignant neoplasm of breast: Secondary | ICD-10-CM | POA: Diagnosis not present

## 2021-01-25 NOTE — Patient Instructions (Signed)
[name] , Thank you for taking time to complete your Medicare Wellness Visit. I appreciate your ongoing commitment to your health goals. Please review the following plan we discussed and let me know if I can assist you in the future.   Screening recommendations/referrals: Colonoscopy: Due, you will reschedule appointment as discussed Mammogram: Due, ordered today, you will receive a call to schedule. Bone Density: Due, ordered today, you will receive a call to schedule. Recommended yearly ophthalmology/optometry visit for glaucoma screening and checkup Recommended yearly dental visit for hygiene and checkup  Vaccinations: Influenza vaccine: Due-May obtain vaccine at our office or your local pharmacy.  Pneumococcal vaccine: up to date Tdap vaccine: up to date, completed 03/26/18, Due 03/26/28 Shingles vaccine: Due, discuss with your local pharmacy   Covid-19:newest booster available at pharmacy  Advanced directives: Please bring a copy of Living Will and/or Craig for your chart.   Conditions/risks identified: see problem list  Next appointment: Follow up in one year for your annual wellness visit    Preventive Care 65 Years and Older, Female Preventive care refers to lifestyle choices and visits with your health care provider that can promote health and wellness. What does preventive care include? A yearly physical exam. This is also called an annual well check. Dental exams once or twice a year. Routine eye exams. Ask your health care provider how often you should have your eyes checked. Personal lifestyle choices, including: Daily care of your teeth and gums. Regular physical activity. Eating a healthy diet. Avoiding tobacco and drug use. Limiting alcohol use. Practicing safe sex. Taking low-dose aspirin every day. Taking vitamin and mineral supplements as recommended by your health care provider. What happens during an annual well check? The  services and screenings done by your health care provider during your annual well check will depend on your age, overall health, lifestyle risk factors, and family history of disease. Counseling  Your health care provider may ask you questions about your: Alcohol use. Tobacco use. Drug use. Emotional well-being. Home and relationship well-being. Sexual activity. Eating habits. History of falls. Memory and ability to understand (cognition). Work and work Statistician. Reproductive health. Screening  You may have the following tests or measurements: Height, weight, and BMI. Blood pressure. Lipid and cholesterol levels. These may be checked every 5 years, or more frequently if you are over 63 years old. Skin check. Lung cancer screening. You may have this screening every year starting at age 9 if you have a 30-pack-year history of smoking and currently smoke or have quit within the past 15 years. Fecal occult blood test (FOBT) of the stool. You may have this test every year starting at age 6. Flexible sigmoidoscopy or colonoscopy. You may have a sigmoidoscopy every 5 years or a colonoscopy every 10 years starting at age 55. Hepatitis C blood test. Hepatitis B blood test. Sexually transmitted disease (STD) testing. Diabetes screening. This is done by checking your blood sugar (glucose) after you have not eaten for a while (fasting). You may have this done every 1-3 years. Bone density scan. This is done to screen for osteoporosis. You may have this done starting at age 4. Mammogram. This may be done every 1-2 years. Talk to your health care provider about how often you should have regular mammograms. Talk with your health care provider about your test results, treatment options, and if necessary, the need for more tests. Vaccines  Your health care provider may recommend certain vaccines, such as: Influenza  vaccine. This is recommended every year. Tetanus, diphtheria, and acellular  pertussis (Tdap, Td) vaccine. You may need a Td booster every 10 years. Zoster vaccine. You may need this after age 61. Pneumococcal 13-valent conjugate (PCV13) vaccine. One dose is recommended after age 33. Pneumococcal polysaccharide (PPSV23) vaccine. One dose is recommended after age 87. Talk to your health care provider about which screenings and vaccines you need and how often you need them. This information is not intended to replace advice given to you by your health care provider. Make sure you discuss any questions you have with your health care provider. Document Released: 04/21/2015 Document Revised: 12/13/2015 Document Reviewed: 01/24/2015 Elsevier Interactive Patient Education  2017 [place] Prevention in the Home Falls can cause injuries. They can happen to people of all ages. There are many things you can do to make your home safe and to help prevent falls. What can I do on the outside of my home? Regularly fix the edges of walkways and driveways and fix any cracks. Remove anything that might make you trip as you walk through a door, such as a raised step or threshold. Trim any bushes or trees on the path to your home. Use bright outdoor lighting. Clear any walking paths of anything that might make someone trip, such as rocks or tools. Regularly check to see if handrails are loose or broken. Make sure that both sides of any steps have handrails. Any raised decks and porches should have guardrails on the edges. Have any leaves, snow, or ice cleared regularly. Use sand or salt on walking paths during winter. Clean up any spills in your garage right away. This includes oil or grease spills. What can I do in the bathroom? Use night lights. Install grab bars by the toilet and in the tub and shower. Do not use towel bars as grab bars. Use non-skid mats or decals in the tub or shower. If you need to sit down in the shower, use a plastic, non-slip stool. Keep the floor  dry. Clean up any water that spills on the floor as soon as it happens. Remove soap buildup in the tub or shower regularly. Attach bath mats securely with double-sided non-slip rug tape. Do not have throw rugs and other things on the floor that can make you trip. What can I do in the bedroom? Use night lights. Make sure that you have a light by your bed that is easy to reach. Do not use any sheets or blankets that are too big for your bed. They should not hang down onto the floor. Have a firm chair that has side arms. You can use this for support while you get dressed. Do not have throw rugs and other things on the floor that can make you trip. What can I do in the kitchen? Clean up any spills right away. Avoid walking on wet floors. Keep items that you use a lot in easy-to-reach places. If you need to reach something above you, use a strong step stool that has a grab bar. Keep electrical cords out of the way. Do not use floor polish or wax that makes floors slippery. If you must use wax, use non-skid floor wax. Do not have throw rugs and other things on the floor that can make you trip. What can I do with my stairs? Do not leave any items on the stairs. Make sure that there are handrails on both sides of the stairs and use them. Fix  handrails that are broken or loose. Make sure that handrails are as long as the stairways. Check any carpeting to make sure that it is firmly attached to the stairs. Fix any carpet that is loose or worn. Avoid having throw rugs at the top or bottom of the stairs. If you do have throw rugs, attach them to the floor with carpet tape. Make sure that you have a light switch at the top of the stairs and the bottom of the stairs. If you do not have them, ask someone to add them for you. What else can I do to help prevent falls? Wear shoes that: Do not have high heels. Have rubber bottoms. Are comfortable and fit you well. Are closed at the toe. Do not wear  sandals. If you use a stepladder: Make sure that it is fully opened. Do not climb a closed stepladder. Make sure that both sides of the stepladder are locked into place. Ask someone to hold it for you, if possible. Clearly mark and make sure that you can see: Any grab bars or handrails. First and last steps. Where the edge of each step is. Use tools that help you move around (mobility aids) if they are needed. These include: Canes. Walkers. Scooters. Crutches. Turn on the lights when you go into a dark area. Replace any light bulbs as soon as they burn out. Set up your furniture so you have a clear path. Avoid moving your furniture around. If any of your floors are uneven, fix them. If there are any pets around you, be aware of where they are. Review your medicines with your doctor. Some medicines can make you feel dizzy. This can increase your chance of falling. Ask your doctor what other things that you can do to help prevent falls. This information is not intended to replace advice given to you by your health care provider. Make sure you discuss any questions you have with your health care provider. Document Released: 01/19/2009 Document Revised: 08/31/2015 Document Reviewed: 04/29/2014 Elsevier Interactive Patient Education  2017 [employer].

## 2021-01-25 NOTE — Progress Notes (Signed)
Subjective:   Brittany [name] is a 75 y.o. female who presents for Medicare Annual (Subsequent) preventive examination.  I connected with Brittany [name] today by telephone and verified that I am speaking with the correct person using two identifiers. Location patient: home Location provider: work Persons participating in the virtual visit: patient, Brittany [name].    I discussed the limitations, risks, security and privacy concerns of performing an evaluation and management service by telephone and the availability of in person appointments. I also discussed with the patient that there may be a patient responsible charge related to this service. The patient expressed understanding and verbally consented to this telephonic visit.    Interactive audio and video telecommunications were attempted between this provider and patient, however failed, due to patient having technical difficulties OR patient did not have access to video capability.  We continued and completed visit with audio only.  Some vital signs may be absent or patient reported.   Time Spent with patient on telephone encounter: 30 minutes   Review of Systems     Cardiac Risk Factors include: advanced age (>47mn, >>27women);diabetes mellitus;dyslipidemia;hypertension     Objective:    Today's Vitals   01/25/21 1422  Weight: 185 lb (83.9 kg)   Body mass index is 27.32 kg/m.  Advanced Directives 01/25/2021 12/02/2019 01/29/2019 11/16/2018 11/12/2018 04/24/2018 04/22/2018  Does Patient Have a Medical Advance Directive? Yes No No Yes;No No Yes Yes  Type of AAcademic librarianLiving will - - - - Living will Living will  Does patient want to make changes to medical advance directive? Yes (MAU/Ambulatory/Procedural Areas - Information given) - - No - Patient declined - No - Patient declined No - Patient declined  Copy of [place] Chart? - - - - - - No - copy requested  Would patient like  information on creating a medical advance directive? - No - Patient declined - No - Patient declined No - Patient declined No - Patient declined No - Patient declined    Current Medications (verified) Outpatient Encounter Medications as of 01/25/2021  Medication Sig   acetaminophen (TYLENOL) 500 MG tablet Take by mouth.   amLODipine (NORVASC) 5 MG tablet Take 1 tablet (5 mg total) by mouth daily.   aspirin EC 81 MG tablet Take 1 tablet (81 mg total) by mouth 2 (two) times daily. (Patient taking differently: Take 81 mg by mouth at bedtime.)   atorvastatin (LIPITOR) 10 MG tablet Take 1 tablet (10 mg total) by mouth daily.   BIOTIN 5000 PO Take 1 tablet by mouth every morning.   bisoprolol-hydrochlorothiazide (ZIAC) 10-6.25 MG tablet Take 1 tablet by mouth daily.   Blood Glucose Monitoring Suppl (ONE TOUCH ULTRA MINI) w/Device KIT Use as directed once a day.  Dx Code:  E11.42   calcium carbonate (TUMS - DOSED IN MG ELEMENTAL CALCIUM) 500 MG chewable tablet Chew 1 tablet by mouth as needed for indigestion or heartburn.   cephALEXin (KEFLEX) 500 MG capsule Take 1 capsule (500 mg total) by mouth 3 (three) times daily.   docusate sodium (COLACE) 100 MG capsule Take 100 mg by mouth 2 (two) times daily.   escitalopram (LEXAPRO) 10 MG tablet Take 1 tablet (10 mg total) by mouth daily.   estradiol (ESTRACE) 1 MG tablet Take 1 tablet (1 mg total) by mouth daily.   ferrous sulfate 325 (65 FE) MG tablet Take 325 mg by mouth daily with breakfast.   folic acid (FOLVITE)  800 MCG tablet Take 800 mcg by mouth daily.    furosemide (LASIX) 20 MG tablet Take 1 tablet (20 mg total) by mouth daily. (Patient taking differently: Take 20 mg by mouth every other day.)   gabapentin (NEURONTIN) 600 MG tablet Take 600 mg by mouth 4 (four) times daily.   glucose blood (ONE TOUCH ULTRA TEST) test strip Check Blood sugar once daily   KLOR-CON M20 20 MEQ tablet Take 1 tablet by mouth once a day.   morphine (MS CONTIN) 30 MG 12  hr tablet Take 30 mg by mouth every 12 (twelve) hours. Per Dr. Nicholaus Bloom with Pain Mgmt   Multiple Vitamins-Minerals (MULTIVITAMIN GUMMIES WOMENS PO) Take 1 tablet by mouth daily.    omeprazole (PRILOSEC) 40 MG capsule Take 1 capsule (40 mg total) by mouth daily.   OneTouch Delica Lancets 41U MISC Check blood sugar once daily   sitaGLIPtin-metformin (JANUMET) 50-1000 MG tablet Take 2 tablets by mouth at bedtime.   Specialty Vitamins Products (BIOTIN PLUS KERATIN) 10000-100 MCG-MG TABS Take 1 tablet by mouth daily.   tiZANidine (ZANAFLEX) 4 MG tablet TAKE 1 TABLET BY MOUTH 3  TIMES DAILY AS NEEDED FOR  MUSCLE SPASM(S)   No facility-administered encounter medications on file as of 01/25/2021.    Allergies (verified) Sulfonamide derivatives   History: Past Medical History:  Diagnosis Date   Anemia    as a younger woman   Arthritis    "all over my body" (10/01/2017)   Chronic pain    In pain clinic    Constipation    Depression    Diabetic peripheral neuropathy (HCC)    GERD (gastroesophageal reflux disease)    High cholesterol    History of hiatal hernia    Hypertension    Type II diabetes mellitus ([place]) dx'd ~ 2008   Past Surgical History:  Procedure Laterality Date   AMPUTATION Right 09/29/2017   Procedure: AMPUTATION RIGHT 1ST RAY;  Surgeon: Leandrew Koyanagi, MD;  Location: [place];  Service: Orthopedics;  Laterality: Right;   AMPUTATION Right 01/29/2019   Procedure: RIGHT TRANSMETATARSAL AMPUTATION;  Surgeon: Newt Minion, MD;  Location: [place];  Service: Orthopedics;  Laterality: Right;   AMPUTATION TOE Right 04/24/2018   Procedure: RIGHT 2ND TOE PROXIMAL INTERPHALANGEAL JOINT DISARTICULATION;  Surgeon: Leandrew Koyanagi, MD;  Location: [place];  Service: Orthopedics;  Laterality: Right;   APPLICATION OF WOUND VAC Right 09/29/2017   "foot"   BLADDER SUSPENSION  07/13/2020   Dr Zigmund Daniel    COLONOSCOPY     I & D EXTREMITY Right 08/10/2017   Procedure: IRRIGATION  AND DEBRIDEMENT FOOT;  Surgeon: Leandrew Koyanagi, MD;  Location: [place];  Service: Orthopedics;  Laterality: Right;   LUMBAR DISC SURGERY  1992   POSTERIOR LUMBAR FUSION  1996   TOE SURGERY Right 1980s   bone removed "inbetween my toes"   TOTAL HIP ARTHROPLASTY Left 11/16/2018   Procedure: LEFT TOTAL HIP ARTHROPLASTY ANTERIOR APPROACH;  Surgeon: Leandrew Koyanagi, MD;  Location: [place];  Service: Orthopedics;  Laterality: Left;   VAGINAL HYSTERECTOMY     Family History  Problem Relation Age of Onset   Cancer Mother        breast   Depression Mother        bipolar   Breast cancer Mother 78   Heart disease Father 79       MI   Diabetes Brother    Kidney disease Brother  Hypertension Brother    Hyperlipidemia Brother    Stroke Brother    Coronary artery disease Other    Diabetes Other    Social History   Socioeconomic History   Marital status: Widowed    Spouse name: Not on file   Number of children: Not on file   Years of education: Not on file   Highest education level: Not on file  Occupational History   Not on file  Tobacco Use   Smoking status: Never   Smokeless tobacco: Never  Vaping Use   Vaping Use: Never used  Substance and Sexual Activity   Alcohol use: Never   Drug use: Never   Sexual activity: Not Currently    Partners: Male  Other Topics Concern   Not on file  Social History Narrative   Not on file   Social Determinants of Health   Financial Resource Strain: Low Risk    Difficulty of Paying Living Expenses: Not very hard  Food Insecurity: Not on file  Transportation Needs: Not on file  Physical Activity: Inactive   Days of Exercise per Week: 0 days   Minutes of Exercise per Session: 0 min  Stress: Not on file  Social Connections: Not on file    Tobacco Counseling Counseling given: Not Answered   Clinical Intake:  Pre-visit preparation completed: Yes  Pain : No/denies pain     Nutritional Risks: None Diabetes: Yes CBG done?: No (telephone  visit) Did pt. bring in CBG monitor from home?: No  How often do you need to have someone help you when you read instructions, pamphlets, or other written materials from your doctor or pharmacy?: 1 - Never  Diabetes:  Is the patient diabetic?  Yes  If diabetic, was a CBG obtained today?  No , visit completed over the phone.  Did the patient bring in their glucometer from home?  No , visit completed over the phone. How often do you monitor your CBG's? Patient states that she plans on getting better a checking blood sugars.   Financial Strains and Diabetes Management:  Are you having any financial strains with the device, your supplies or your medication? No .  Does the patient want to be seen by Chronic Care Management for management of their diabetes?  No  Would the patient like to be referred to a Nutritionist or for Diabetic Management?  No   Diabetic Exams:  Diabetic Eye Exam: Overdue for diabetic eye exam. Pt has been advised about the importance in completing this exam. Last exam was in 2021. Patient plans to make an appointment. Diabetic Foot Exam: . Pt has been advised about the importance in completing this exam. To be completed by PCP  Interpreter Needed?: No  Information entered by :: Orrin Brigham LPN   Activities of Daily Living In your present state of health, do you have any difficulty performing the following activities: 01/25/2021  Hearing? N  Vision? N  Difficulty concentrating or making decisions? N  Walking or climbing stairs? N  Dressing or bathing? N  Doing errands, shopping? Y  Comment Has a friend drive to appointments and shopping  Preparing Food and eating ? N  Using the Toilet? N  In the past six months, have you accidently leaked urine? N  Do you have problems with loss of bowel control? N  Managing your Medications? N  Managing your Finances? N  Housekeeping or managing your Housekeeping? N  Some recent data might be hidden  Patient Care  Team: Carollee Herter, Alferd Apa, DO as PCP - General Nicholaus Bloom, MD as Consulting Physician (Neurology) Milus Banister, MD as Attending Physician (Gastroenterology) Leandrew Koyanagi, MD as Attending Physician (Orthopedic Surgery) Cherre Robins, PharmD (Pharmacist)  Indicate any recent Medical Services you may have received from other than [facility] providers in the past year (date may be approximate).     Assessment:   This is a routine wellness examination for Brittany [name].  Hearing/Vision screen Hearing Screening - Comments:: No issues Vision Screening - Comments:: Last eye exam 2021, patient plans to schedule an appointment.   Dietary issues and exercise activities discussed: Current Exercise Habits: Home exercise routine, Type of exercise: stretching, Time (Minutes): 10, Frequency (Times/Week): 3, Weekly Exercise (Minutes/Week): 30, Intensity: Mild, Exercise limited by: cardiac condition(s);orthopedic condition(s)   Goals Addressed             This Visit's Progress    Patient Stated       Would like to actively walk more.       Depression Screen PHQ 2/9 Scores 01/25/2021 10/26/2020 08/30/2019 06/13/2017 06/13/2017 02/19/2016 12/07/2014  PHQ - 2 Score 3 4 2  0 0 0 1  PHQ- 9 Score - 9 9 0 - - -    Fall Risk Fall Risk  01/25/2021 10/26/2019 06/13/2017 02/19/2016 12/07/2014  Falls in the past year? 1 1 Yes Yes Yes  Number falls in past yr: 1 1 2  or more 2 or more 2 or more  Comment impaired balance - - - -  Injury with Fall? 0 1 No No No  Comment - - - patient report blacking out 2 of the falls but the other 2 she tripped of flower pot/bed out side. no injuries -  Risk Factor Category  - - - - High Fall Risk  Risk for fall due to : Impaired balance/gait History of fall(s) Impaired mobility - Medication side effect;Impaired balance/gait;Impaired mobility  Risk for fall due to: Comment - - - - neuropathy in feet  Follow up Falls prevention discussed Falls evaluation completed Falls prevention  discussed - Education provided    FALL RISK PREVENTION PERTAINING TO THE HOME:  Any stairs in or around the home? Yes  If so, are there any without handrails? Yes  Home free of loose throw rugs in walkways, pet beds, electrical cords, etc? Yes  Adequate lighting in your home to reduce risk of falls? Yes   ASSISTIVE DEVICES UTILIZED TO PREVENT FALLS:  Life alert? No  Use of a cane, walker or w/c? Yes  , patient uses a walking stick. Grab bars in the bathroom? Yes  Shower chair or bench in shower? Yes  Elevated toilet seat or a handicapped toilet? Yes   TIMED UP AND GO:  Was the test performed? No . Visit completed over the phone.   Cognitive Function: Normal cognitive status assessed by this Nurse Health Advisor. No abnormalities found.   MMSE - Mini Mental State Exam 02/19/2016 12/07/2014  Orientation to time 5 5  Orientation to Place 5 5  Registration 3 3  Attention/ Calculation 5 5  Recall 3 3  Language- name 2 objects 2 2  Language- repeat 1 1  Language- follow 3 step command 3 3  Language- read & follow direction 1 1  Write a sentence 1 1  Copy design 1 1  Total score 30 30        Immunizations Immunization History  Administered Date(s) Administered   DTP 07/14/2002  Fluad Quad(high Dose 65+) 12/27/2019   Influenza Whole 02/12/2007   Influenza, High Dose Seasonal PF 02/11/2014, 02/10/2015, 02/10/2015, 03/26/2018   Influenza,inj,Quad PF,6+ Mos 05/14/2013, 02/19/2016   Pneumococcal Conjugate-13 05/04/2015   Pneumococcal Polysaccharide-23 05/14/2013, 03/26/2018   Tdap 03/26/2018   Zoster, Live 01/02/2010    TDAP status: Up to date  Flu Vaccine status: Due, Education has been provided regarding the importance of this vaccine. Advised may receive this vaccine at local pharmacy or Health Dept. Aware to provide a copy of the vaccination record if obtained from local pharmacy or Health Dept. Verbalized acceptance and understanding.  Pneumococcal vaccine status:  Up to date  Covid-19 vaccine status: Information provided on how to obtain vaccines.   Qualifies for Shingles Vaccine? Yes   Zostavax completed Yes   Shingrix Completed?: No.    Education has been provided regarding the importance of this vaccine. Patient has been advised to call insurance company to determine out of pocket expense if they have not yet received this vaccine. Advised may also receive vaccine at local pharmacy or Health Dept. Verbalized acceptance and understanding.  Screening Tests Health Maintenance  Topic Date Due   Zoster Vaccines- Shingrix (1 of 2) Never done   MAMMOGRAM  05/01/2018   COVID-19 Vaccine (4 - Booster for Pfizer series) 03/28/2020   COLONOSCOPY (Pts 45-54yr Insurance coverage will need to be confirmed)  06/11/2020   FOOT EXAM  06/24/2020   OPHTHALMOLOGY EXAM  10/27/2020   INFLUENZA VACCINE  11/06/2020   HEMOGLOBIN A1C  03/04/2021   TETANUS/TDAP  03/26/2028   Pneumonia Vaccine 75 Years old  Completed   DEXA SCAN  Completed   Hepatitis C Screening  Completed   HPV VACCINES  Aged Out    Health Maintenance  Health Maintenance Due  Topic Date Due   Zoster Vaccines- Shingrix (1 of 2) Never done   MAMMOGRAM  05/01/2018   COVID-19 Vaccine (4 - Booster for [place]eries) 03/28/2020   COLONOSCOPY (Pts 45-462yrInsurance coverage will need to be confirmed)  06/11/2020   FOOT EXAM  06/24/2020   OPHTHALMOLOGY EXAM  10/27/2020   INFLUENZA VACCINE  11/06/2020    Colorectal Cancer Screening: Due, patient plans to reschedule appointment.   Mammogram status: Ordered 01/25/21. Pt provided with contact info and advised to call to schedule appt.   Bone Density status: Ordered 01/25/21. Pt provided with contact info and advised to call to schedule appt.  Lung Cancer Screening: (Low Dose CT Chest recommended if Age 73-80ears, 30 pack-year currently smoking OR have quit w/in 15years.) does not qualify.     Additional Screening:  Hepatitis C Screening:  Completed 05/08/15  Vision Screening: Recommended annual ophthalmology exams for early detection of glaucoma and other disorders of the eye. Is the patient up to date with their annual eye exam?  No , Due, patient plans on scheduling appointment Who is the provider or what is the name of the office in which the patient attends annual eye exams? Patient unsure of providers name.   Dental Screening: Recommended annual dental exams for proper oral hygiene. Dental resources will be mailed to patient.   Community Resource Referral / Chronic Care Management: CRR required this visit?  No   CCM required this visit?  No      Plan:     I have personally reviewed and noted the following in the patient's chart:   Medical and social history Use of alcohol, tobacco or illicit drugs  Current medications and supplements including  opioid prescriptions.  Functional ability and status Nutritional status Physical activity Advanced directives List of other physicians Hospitalizations, surgeries, and ER visits in previous 12 months Vitals Screenings to include cognitive, depression, and falls Referrals and appointments  In addition, I have reviewed and discussed with patient certain preventive protocols, quality metrics, and best practice recommendations. A written personalized care plan for preventive services as well as general preventive health recommendations were provided to patient.  Due to this being a telephonic visit, the after visit summary with patients personalized plan was offered to patient via mail or my-chart. Patient preferred to pick up at office at next visit.      Loma Messing, LPN   [id number]   Nurse Health Advisor  Nurse Notes: None

## 2021-01-26 DIAGNOSIS — G4700 Insomnia, unspecified: Secondary | ICD-10-CM | POA: Diagnosis not present

## 2021-01-26 DIAGNOSIS — G894 Chronic pain syndrome: Secondary | ICD-10-CM | POA: Diagnosis not present

## 2021-01-26 DIAGNOSIS — E1142 Type 2 diabetes mellitus with diabetic polyneuropathy: Secondary | ICD-10-CM | POA: Diagnosis not present

## 2021-01-26 DIAGNOSIS — Z79891 Long term (current) use of opiate analgesic: Secondary | ICD-10-CM | POA: Diagnosis not present

## 2021-01-29 ENCOUNTER — Telehealth (HOSPITAL_BASED_OUTPATIENT_CLINIC_OR_DEPARTMENT_OTHER): Payer: Self-pay

## 2021-01-29 ENCOUNTER — Ambulatory Visit (INDEPENDENT_AMBULATORY_CARE_PROVIDER_SITE_OTHER): Payer: Medicare Other | Admitting: Pharmacist

## 2021-01-29 DIAGNOSIS — E1165 Type 2 diabetes mellitus with hyperglycemia: Secondary | ICD-10-CM

## 2021-01-29 DIAGNOSIS — F411 Generalized anxiety disorder: Secondary | ICD-10-CM

## 2021-01-29 DIAGNOSIS — I10 Essential (primary) hypertension: Secondary | ICD-10-CM

## 2021-01-29 DIAGNOSIS — E1169 Type 2 diabetes mellitus with other specified complication: Secondary | ICD-10-CM

## 2021-01-29 NOTE — Patient Instructions (Signed)
[name],  It was a pleasure speaking with you today.  I have attached a summary of our visit today and information about your health goals.  If you have any questions or concerns, please feel free to contact me either at the phone number below or with a MyChart message.   Keep up the good work!  Cherre Robins, PharmD Clinical Pharmacist [facility] [phone] (direct line)  [phone] (main office number)   PATIENT GOALS:  Goals Addressed             This Visit's Progress    Chronic Care Managment Pharmacy Care Plan       CARE PLAN ENTRY  Current Barriers:  Chronic Disease Management support, education, and care coordination needs related to Diabetes, Hypertension, Hyperlipidemia, Depression, Neuropathy/Pain, Muscle Spasm, GERD, Estrogen Deficiency   Hypertension Pharmacist Clinical Goal(s): Over the next 90 days, patient will work with PharmD and providers to maintain BP goal <140/90 BP Readings from Last 3 Encounters:  09/01/20 122/70  12/27/19 120/84  12/02/19 135/85  Current regimen:  Amlodipine 5mg  daily Bisoprolol-hydrochlorothiazide 10-6.25mg  daily furosemide 20mg  every other day Intervention:  Reviewed refill history Patient self care activities - Over the next 90 days, patient will: Maintain hypertension medication regimen.  Check blood pressure once per week, record and bring to future appointments.   Hyperlipidemia Pharmacist Clinical Goal(s): Over the next 90 days, patient will work with PharmD and providers to maintain LDL goal <70 Lipid Panel     Component Value Date/Time   CHOL 155 09/01/2020 0906   TRIG 85.0 09/01/2020 0906   HDL 65.80 09/01/2020 0906   CHOLHDL 2 09/01/2020 0906   VLDL 17.0 09/01/2020 0906   LDLCALC 72 09/01/2020 0906   LDLCALC 70 12/27/2019 1333   Current regimen:  Atorvastatin 10mg  daily Patient self care activities - Over the next 90 days, patient will: Maintain cholesterol  medication regimen.   Diabetes Pharmacist Clinical Goal(s): Over the next 90 days, patient will work with PharmD and providers to maintain A1c goal <7% Lab Results  Component Value Date   HGBA1C 6.3 09/01/2020  Current regimen:  Janumet 50-1000mg  2 tablets daily at bedtime Patient self care activities - Over the next 90 days, patient will: Maintain diabetes medication regimen Check blood glucose once daily, record and bring to future appointments Discussed increasing exercise - walking, Silver Sneakers at [facility] or on [employer] program recommended. Increase to goal of at least 150 minutes per week.  GERD / acid reflux Pharmacist Clinical Goal(s) Over the next 90 days, patient will work with PharmD and providers to reduce symptoms of GERD Current regimen:  Omeprazole 40mg  daily Tums (calcium carbonate) - take as needed for heart burn symptoms Patient self care activities - Over the next 90 days, patient will: Continue current medication for acid reflux.   Estrogen Deficiency Pharmacist Clinical Goal(s) Over the next 90 days, patient will work with PharmD and providers to reduce symptoms of estrogen deficiency and polypharmacy Current regimen:  Estradiol 1mg  daily Patient self care activities - Over the next 90 days, patient will: Inform PCP or Pharmacist if hot flashes recur  General Anxiety:  Pharmacist Clinical Goal(s) Over the next 90 days, patient will work with PharmD and providers to maintain control of anxiety / depression Current regimen:  Escitalopram 10mg  daily Interventions: Discussed grief process  Patient self care activities - Over the next 90 days, patient will: Continue current medication therapy Contact office if you would  like referral for counseling.   Health Maintenance:  Reviewed vaccination history and discussed benefits of annual flu, COVID booster and Shingrix vaccinations Patient to get the following vaccines: Annual flu vaccine - has  received from [facility] already COVID booster - plans to get in next 2 to 3 weeks Shingrix - will check cost in 2023. Anticipating better coverage of Shingrix in 2023 by Medicare.  Reminded to get annual eye exam  Medication management Pharmacist Clinical Goal(s): Over the next 90 days, patient will work with PharmD and providers to maintain optimal medication adherence Current pharmacy: [facility]  Inter[facility] Comprehensive medication review performed. Continue current medication management strategy Medication list updated Patient self care activities - Over the next 90 days, patient will: Focus on medication adherence by filling medications appropriately  Take medications as prescribed Report any questions or concerns to PharmD and/or provider(s)  Medication management Pharmacist Clinical Goal(s): Over the next 90 days, patient will work with PharmD and providers to maintain optimal medication adherence Current pharmacy: [facility]  Interventions Comprehensive medication review performed. Continue current medication management strategy Patient self care activities - Over the next 90 days, patient will: Focus on medication adherence by filling medications appropriately  Take medications as prescribed Report any questions or concerns to PharmD and/or provider(s)  Patient Goals/Self-Care Activities Over the next 180 days, patient will:  take medications as prescribed Check glucose every other day, document, and provide at future appointments,  check blood pressure once per week, document, and provide at future appointments, and  target a minimum of 150 minutes of moderate intensity exercise weekly Get COVID booster and annual eye exam  Please see past updates related to this goal by clicking on the "Past Updates" button in the selected goal            The patient verbalized understanding of instructions, educational materials, and care plan  provided today and agreed to receive a mailed copy of patient instructions, educational materials, and care plan.   Telephone follow up appointment with care management team member scheduled for: 3 months  Cherre Robins, PharmD Clinical Pharmacist [place] [employer] [facility]

## 2021-01-29 NOTE — Chronic Care Management (AMB) (Signed)
Chronic Care Management Pharmacy Note  01/29/2021 Name:  Brittany [name] MRN:  [id number] DOB:  [birth year]  Summary:  Increase in anxiety and depression due to granddaughter's auto accident in August 2022 Granddaughter is not doing well physically. Patient in taking escitalopram currently. Mentioned counseling and she declined today because she will be leaving soon to go back to be with her granddaughter in the next few days. States she will think about it for the future.  Reviewed medications and updated medication list - adding doxepin that she has been taking at night for sleep and Miralax that she started for constipation Reviewed needed vaccinations and reminded her to get annual eye exam.   Subjective: Brittany [name] is an 75 y.o. year old female who is a primary patient of Ann Held, DO.  The CCM team was consulted for assistance with disease management and care coordination needs.    Engaged with patient by telephone for follow up visit in response to provider referral for pharmacy case management and/or care coordination services.   Consent to Services:  The patient was given information about Chronic Care Management services, agreed to services, and gave verbal consent prior to initiation of services.  Please see initial visit note for detailed documentation.   Patient Care Team: Carollee Herter, Alferd Apa, DO as PCP - General Nicholaus Bloom, MD as Consulting Physician (Neurology) Milus Banister, MD as Attending Physician (Gastroenterology) Leandrew Koyanagi, MD as Attending Physician (Orthopedic Surgery) Cherre Robins, PharmD (Pharmacist)  Recent office visits: 08/24/2020 - PCP (Dr Etter Sjogren) Video Visit; no med changes   Recent consult visits: 01/04/2021 - Ortho Surgery (Persons, [facility]) F/U Foot / Toe ulcer. No medication changes noted 12/07/2020 - Ortho Surgery (Dr Sharol Given) F/U cellulitis. Debrided plantan ulcer of left foot. No medications started or changed.  11/29/2020  - Gyn Urology - (Dr Zigmund Daniel) F/U  s/p anterior/posterior colporrhaphy and bilateral SSLF with ureteral stent placement on 07/13/20 for massive Stage IV VVP. Noted to be constipated. Recommended Fleets Enema. Start Miralax daily to help with bowel movements. F/U 1 year. 11/29/2020 - Ortho Surgery (Persons, [facility]) seen for osteomyelitis of right foot and cellulitis. Restarted cephalexin 553m 3 times a day. Compression stocking. F/u 1 week. 11/15/2020 - [facility]o [place] (Dr ZCoralyn Pear Seen for f/u cellulitis. Improved. Completed antibiotics. No medication changes. F/U 2 weeks 11/03/2020 - Ortho Surgery (Dr ZCoralyn Pear Cellulitis of right lower extremity. Prescried cephalexin 5039m3 times a day. F/U in 2 weeks 10/05/2020 - Ortho (Dr DuSharol Givennon pressure chronic ulcer of left foot and diabetic neuropathy. No med changes.  09/14/2020 - ortho (Persons, [facility]) pain in left foot. Performed debridement; no med changes.  08/16/2020 - [facility] ([facility]. Noted soft tissue infection. Treated with Augmentin and Flagly x 10 days.    Hospital visits: Not in last 6 months  Objective:  Lab Results  Component Value Date   CREATININE 0.70 09/01/2020   CREATININE 0.66 12/27/2019   CREATININE 0.67 10/26/2019    Lab Results  Component Value Date   HGBA1C 6.3 09/01/2020   Last diabetic Eye exam:  Lab Results  Component Value Date/Time   HMDIABEYEEXA No Retinopathy 10/28/2019 12:00 AM   HMDIABEYEEXA No Retinopathy 10/28/2019 12:00 AM    Last diabetic Foot exam: No results found for: HMDIABFOOTEX      Component Value Date/Time   CHOL 155 09/01/2020 0906   TRIG 85.0 09/01/2020 0906   HDL 65.80 09/01/2020 0906  CHOLHDL 2 09/01/2020 0906   VLDL 17.0 09/01/2020 0906   LDLCALC 72 09/01/2020 0906   LDLCALC 70 12/27/2019 1333    Hepatic Function Latest Ref Rng & Units 09/01/2020 12/27/2019 10/26/2019  Total Protein 6.0 - 8.3 g/dL 6.8 6.1 6.7  Albumin 3.5 - 5.2 g/dL 3.8 - 3.8  AST 0 -  37 U/L _0 ALT 0 - 35 U/L _1 Alk Phosphatase 39 - 117 U/L 53 - 58  Total Bilirubin 0.2 - 1.2 mg/dL 0.4 0.2 0.2  Bilirubin, Direct 0.0 - 0.3 mg/dL - - -    Lab Results  Component Value Date/Time   TSH 1.39 05/15/2017 02:49 PM   TSH 0.46 06/18/2012 10:26 AM   FREET4 1.11 09/18/2010 12:08 PM   FREET4 0.9 02/09/2009 02:01 PM    CBC Latest Ref Rng & Units 10/26/2019 01/29/2019 11/18/2018  WBC 4.0 - 10.5 K/uL 9.4 10.6(H) 14.0(H)  Hemoglobin 12.0 - 15.0 g/dL 12.6 12.2 9.6(L)  Hematocrit 36.0 - 46.0 % 37.2 39.6 28.8(L)  Platelets 150.0 - 400.0 K/uL 237.0 244 164    No results found for: VD25OH  Clinical ASCVD: Yes  The 10-year ASCVD risk score (Arnett DK, et al., 2019) is: 23%   Values used to calculate the score:     Age: 75 years     Sex: Female     Is Non-Hispanic African American: No     Diabetic: Yes     Tobacco smoker: No     Systolic Blood Pressure: 734 mmHg     Is BP treated: Yes     HDL Cholesterol: 65.8 mg/dL     Total Cholesterol: 155 mg/dL     Social History   Tobacco Use  Smoking Status Never  Smokeless Tobacco Never   BP Readings from Last 3 Encounters:  09/01/20 122/70  12/27/19 120/84  12/02/19 135/85   Pulse Readings from Last 3 Encounters:  09/01/20 (!) 56  12/27/19 63  12/02/19 66   Wt Readings from Last 3 Encounters:  01/25/21 185 lb (83.9 kg)  12/27/19 185 lb 9.6 oz (84.2 kg)  12/01/19 178 lb (80.7 kg)    Assessment: Review of patient past medical history, allergies, medications, health status, including review of consultants reports, laboratory and other test data, was performed as part of comprehensive evaluation and provision of chronic care management services.   SDOH:  (Social Determinants of Health) assessments and interventions performed:  SDOH Interventions    Flowsheet Row Most Recent Value  SDOH Interventions   Food Insecurity Interventions Intervention Not Indicated  Depression Interventions/Treatment  Currently on  Treatment, Counseling  [discussed counseling - patient is considering]       CCM Care Plan  Allergies  Allergen Reactions   Sulfonamide Derivatives Hives    Medications Reviewed Today     Reviewed by Cherre Robins, PharmD (Pharmacist) on 01/29/21 at 39  Med List Status: <None>   Medication Order Taking? Sig Documenting Provider Last Dose Status Informant  acetaminophen (TYLENOL) 500 MG tablet [id number] Yes Take by mouth. [provider] Taking Active   amLODipine (NORVASC) 5 MG tablet [id number] Yes Take 1 tablet (5 mg total) by mouth daily. Ann Held, DO Taking Active   aspirin EC 81 MG tablet [id number] Yes Take 1 tablet (81 mg total) by mouth 2 (two) times daily.  Patient taking differently: Take 81 mg by mouth at bedtime.   Leandrew Koyanagi, MD Taking Active   atorvastatin (LIPITOR) 10  MG tablet [id number] Yes Take 1 tablet (10 mg total) by mouth daily. Roma Schanz R, DO Taking Active   bisoprolol-hydrochlorothiazide [facility]) 10-6.25 MG tablet [id number] Yes Take 1 tablet by mouth daily. Roma Schanz R, DO Taking Active   Blood Glucose Monitoring Suppl (ONE TOUCH ULTRA MINI) w/Device KIT [id number] Yes Use as directed once a day.  Dx Code:  G64.40 Carollee Herter, Alferd Apa, DO Taking Active Self  calcium carbonate (TUMS - DOSED IN MG ELEMENTAL CALCIUM) 500 MG chewable tablet [id number] Yes Chew 1 tablet by mouth as needed for indigestion or heartburn. [provider] Taking Active   docusate sodium (COLACE) 100 MG capsule [id number] Yes Take 100 mg by mouth 2 (two) times daily. [provider] Taking Active Self  escitalopram (LEXAPRO) 10 MG tablet [id number] Yes Take 1 tablet (10 mg total) by mouth daily. Ann Held, DO Taking Active   estradiol (ESTRACE) 1 MG tablet [id number] Yes Take 1 tablet (1 mg total) by mouth daily. Ann Held, DO Taking Active   ferrous sulfate 325 (65 FE) MG tablet [id number] Yes Take 325 mg by  mouth daily with breakfast. [provider] Taking Active Self  folic acid (FOLVITE) 220 MCG tablet [id number] Yes Take 800 mcg by mouth daily.  [provider] Taking Active Self  furosemide (LASIX) 20 MG tablet [id number] Yes Take 1 tablet (20 mg total) by mouth daily.  Patient taking differently: Take 20 mg by mouth every other day.   Ann Held, DO Taking Active   gabapentin (NEURONTIN) 600 MG tablet [id number] Yes Take 600 mg by mouth 4 (four) times daily. [provider] Taking Active   glucose blood (ONE TOUCH ULTRA TEST) test strip [id number] Yes Check Blood sugar once daily Carollee Herter, Alferd Apa, DO Taking Active   KLOR-CON M20 20 MEQ tablet [id number] Yes Take 1 tablet by mouth once a day. Ann Held, DO Taking Active   morphine (MS CONTIN) 30 MG 12 hr tablet [id number] Yes Take 30 mg by mouth every 12 (twelve) hours. Per Dr. Nicholaus Bloom with Pain Mgmt [provider] Taking Active Self  Multiple Vitamins-Minerals (MULTIVITAMIN GUMMIES WOMENS PO) [id number] Yes Take 1 tablet by mouth daily.  [provider] Taking Active Self  omeprazole (PRILOSEC) 40 MG capsule [id number] Yes Take 1 capsule (40 mg total) by mouth daily. Ann Held, DO Taking Active   OneTouch Delica Lancets [address] [place] [id number] Yes Check blood sugar once daily Carollee Herter, Kendrick Fries R, DO Taking Active   polyethylene glycol (MIRALAX / GLYCOLAX) 17 g packet [id number] Yes Take 17 g by mouth daily. [provider] Taking Active   sitaGLIPtin-metformin (JANUMET) 50-1000 MG tablet [id number] Yes Take 2 tablets by mouth at bedtime. Ann Held, DO Taking Active   Specialty Vitamins Products (BIOTIN PLUS KERATIN) 10000-100 MCG-MG TABS [id number] Yes Take 1 tablet by mouth daily. 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/16/2018   Vaginal prolapse 09/30/2018   Primary  osteoarthritis of left hip 06/10/2018   Osteomyelitis of second toe of right foot ([place]) 04/24/2018   Osteomyelitis of right foot ([place]) 04/21/2018   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 ([place]) 04/17/2018   Callus 01/13/2018   Generalized anxiety disorder 10/16/2017   Menopause 10/16/2017   DM (diabetes mellitus) type II uncontrolled, periph vascular disorder 10/16/2017   Hyperlipidemia associated with type 2 diabetes mellitus ([place]) 10/16/2017   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 ([place]) 10/14/2017   Osteomyelitis of great toe of right foot ([place]) 09/25/2017   Pain in right foot 09/16/2017   Cellulitis 08/08/2017   Hypoglycemia without diagnosis of diabetes mellitus 08/08/2017   Peripheral neuropathy 08/08/2017   Abscess 08/08/2017   Hyperlipidemia LDL goal <70 09/22/2016   Tooth pain 09/03/2012   Edema 09/03/2012   Supraclavicular fossa fullness 06/18/2012   Hair loss 09/18/2010   Fatigue 09/18/2010   DYSPEPSIA&OTHER SPEC DISORDERS FUNCTION STOMACH 05/08/2010   DIARRHEA 05/08/2010   DIZZINESS 04/19/2010   OTHER ACUTE SINUSITIS 02/28/2010   NAUSEA 02/28/2010   Pain in right ankle and joints of right foot 02/15/2010   URI 07/09/2007   GERD 02/12/2007   LOW BACK PAIN 02/12/2007   Hypertension 09/03/2006   HOT FLASHES 09/03/2006   INSOMNIA 09/03/2006    Immunization History  Administered Date(s) Administered   DTP 07/14/2002   Fluad Quad(high Dose 65+) 12/27/2019   Influenza Whole 02/12/2007   Influenza, High Dose Seasonal PF 02/11/2014, 02/10/2015, 02/10/2015, 03/26/2018   Influenza,inj,Quad PF,6+ Mos 05/14/2013, 02/19/2016   Pneumococcal  Conjugate-13 05/04/2015   Pneumococcal Polysaccharide-23 05/14/2013, 03/26/2018   Tdap 03/26/2018   Zoster, Live 01/02/2010    Conditions to be addressed/monitored: HTN, HLD, DMII, Anxiety, and GERD; PVD; insomnia; neuropathy  Care Plan : General Pharmacy (Adult)  Updates made by Cherre Robins, PHARMD since 01/29/2021 12:00 AM     Problem: Type 2 DM; HTN; hyperlipidemia; GAD; chronic pain; anemia; edema; neuropathy; PVD; GERD   Priority: High     Long-Range Goal: Chronic Care and Medication Management Pharmacy Goals   Start Date: 10/26/2020  Priority: High  Note:   Current Barriers:  Education needed regarding medication therapy and chronic conditions management.  In need of support and counseling related to family health issues and loss of brother.   Pharmacist Clinical Goal(s):  Over the next 180 days, patient will achieve adherence to monitoring guidelines and medication adherence to achieve therapeutic efficacy maintain control of hypertension , hyperlipidemia and type 2 diabetes as evidenced by attainment of goals listed below and prevention of complications  Increase physical activity  through collaboration with PharmD and provider.   Interventions: 1:1 collaboration with Carollee Herter, Alferd Apa, DO regarding development and update of comprehensive plan of care as evidenced by provider attestation and co-signature Inter-disciplinary care team collaboration (see longitudinal plan of care) Comprehensive medication review performed; medication list updated in electronic medical record    Hypertension Controlled;  BP goal <140/90 Has not been checking BP at home recently as she has been at her son's home a lot recently (related to granddaughter's accident)  States edema is mild - mostly in evenings.  Current regimen:  Amlodipine 80m daily Bisoprolol-HCTZ 10-6.25mg daily furosemide 223mevery other day Intervention:  Reviewed refill history - Amlodipine and bisoprolol HCTZ  last filled #90 on 12/05/2020 Recommended maintain current hypertension medication regimen.  Recommended check blood pressure once per week, record and bring to future appointments.   Hyperlipidemia / PVD Last LDL  was slightly elevated at 72 but previous LDL was 70; LDL goal <70 Current regimen:  Atorvastatin 76m daily Aspirin 824mdaily  Interventions:  Maintain cholesterol medication regimen.  Reviewed refill history (called [facility]) atorvastatin last filled 12/05/2020 for 90 days  Diabetes Controlled; A1c goal <7% Has not been checking blood glucose regularly Current regimen:  Janumet 50-100057m tablets daily at bedtime Diet: patient is limiting intake of sugar, bread and starchy vegetable (potatoes and corn)  Exercise: not currently (discussed at last visit but due to going between her home and her son's home due to granddaghters accident she has not started regular exercise yet.  Interventions:  Maintain diabetes medication regimen Continue to check blood glucose every other day, record and bring to future appointments Discussed increasing exercise - walking[facility] recommended. Increase to goal of at least 150 minutes per week.   GERD / acid reflux Goal: reduce symptoms of GERD Current regimen:  Omeprazole 39m32mily Tums (calcium carbonate) - take as needed for heart burn symptoms Interventions: Continue current medication for acid reflux.   Estrogen Deficiency Goal:  reduce symptoms of estrogen deficiency and polypharmacy Current regimen:  Estradiol 1mg 2mly Interventions: (addressed at previous appointment)  Discussed risks verses benefits of estrogen replacement  General Anxiety:  Goal: maintain control of anxiety / depression Brother died in July 08/09/22om shooting. His sudden death was saddened Mrs. PattoHinkhey were very close.  Granddaughter had a serious auto accident in August 2022 and was in coma.    Current regimen:  Escitalopram 10mg 66my Doxepin 10mg d41m at bedtime as needed Interventions: Offered referral for counseling - patient states she will think about it Continue current medication therapy Contact office if you would like referral for grief counseling.   Health Maintenance:  Reviewed vaccination history and discussed benefits of annual flu, COVID booster and Shingrix vaccinations Patient to get the following vaccines: [facility] - plans to get in next 2 to 3 weeks Shingrix - will check cost in 2023. Anticipating better coverage of Shingrix in 2023 by Medicare.  Reminded to get annual eye exam  Medication management Pharmacist Clinical Goal(s): Over the next 90 days, patient will work with PharmD and providers to maintain optimal medication adherence [facility] Comprehensive medication review performed. Continue current medication management strategy Medication list updated Patient self care activities - Over the next 90 days, patient will: Focus on medication adherence by filling medications appropriately  Take medications as prescribed Report any questions or concerns to PharmD and/or provider(s)  Patient Goals/Self-Care Activities Over the next 180 days, patient will:  take medications as prescribed Check glucose every other day, document, and provide at future appointments,  check blood pressure once per week, document, and provide at future appointments, and  target a minimum of 150 minutes of moderate intensity exercise weekly Get COVID booster and annual eye exam  Follow Up Plan: Telephone follow up appointment with care management team member scheduled for:  3 months  Please see past updates related to this goal by clicking on the "Past Updates" button in the selected goal        Medication Assistance: None required.  Patient affirms current  coverage meets needs.  [id number] [phone]  [place] ([facility] - [place], [address] [place] [address] Phone: [phone] Fax: [phone]  Follow Up:  Patient agrees to Care Plan and Follow-up.  Plan: Telephone follow up appointment with care management team member scheduled for:  3 months  Cherre Robins, PharmD Clinical Pharmacist [facility] [place] [phone]

## 2021-02-01 ENCOUNTER — Ambulatory Visit: Payer: Medicare Other | Admitting: Orthopedic Surgery

## 2021-02-04 ENCOUNTER — Other Ambulatory Visit: Payer: Self-pay | Admitting: Family Medicine

## 2021-02-04 DIAGNOSIS — E785 Hyperlipidemia, unspecified: Secondary | ICD-10-CM

## 2021-02-04 DIAGNOSIS — F411 Generalized anxiety disorder: Secondary | ICD-10-CM

## 2021-02-04 DIAGNOSIS — I10 Essential (primary) hypertension: Secondary | ICD-10-CM

## 2021-02-04 DIAGNOSIS — R609 Edema, unspecified: Secondary | ICD-10-CM

## 2021-02-04 DIAGNOSIS — E1110 Type 2 diabetes mellitus with ketoacidosis without coma: Secondary | ICD-10-CM

## 2021-02-04 DIAGNOSIS — E1169 Type 2 diabetes mellitus with other specified complication: Secondary | ICD-10-CM

## 2021-02-05 DIAGNOSIS — I10 Essential (primary) hypertension: Secondary | ICD-10-CM

## 2021-02-05 DIAGNOSIS — E785 Hyperlipidemia, unspecified: Secondary | ICD-10-CM | POA: Diagnosis not present

## 2021-02-05 DIAGNOSIS — E1165 Type 2 diabetes mellitus with hyperglycemia: Secondary | ICD-10-CM

## 2021-02-05 DIAGNOSIS — E1169 Type 2 diabetes mellitus with other specified complication: Secondary | ICD-10-CM | POA: Diagnosis not present

## 2021-02-07 ENCOUNTER — Other Ambulatory Visit: Payer: Self-pay | Admitting: Family Medicine

## 2021-02-12 ENCOUNTER — Encounter: Payer: Self-pay | Admitting: Orthopedic Surgery

## 2021-02-12 ENCOUNTER — Ambulatory Visit (INDEPENDENT_AMBULATORY_CARE_PROVIDER_SITE_OTHER): Payer: Medicare Other | Admitting: Orthopedic Surgery

## 2021-02-12 ENCOUNTER — Other Ambulatory Visit: Payer: Self-pay

## 2021-02-12 DIAGNOSIS — L97521 Non-pressure chronic ulcer of other part of left foot limited to breakdown of skin: Secondary | ICD-10-CM | POA: Diagnosis not present

## 2021-02-12 NOTE — Progress Notes (Signed)
Office Visit Note   Patient: Brittany [name]           Date of Birth: 10/29/1945           MRN: [id number] Visit Date: 02/12/2021              Requested by: Carollee Herter, [place], [place] [address] [place],  [place] [address] PCP: Carollee Herter, Alferd Apa, DO  Chief Complaint  Patient presents with   Left Foot - Wound Check    Toe ulcer      HPI: Patient is a 75 year old woman who presents for both lower extremities she is status post a right transmetatarsal amputation.  Patient complains of callus and pressure points on her left foot.  She states that she was having difficulty ambulating with the postoperative shoe.  Assessment & Plan: Visit Diagnoses:  1. Non-pressure chronic ulcer of other part of left foot limited to breakdown of skin [facility])     Plan: Patient will continue with her croc type shoes.  Reevaluate in 3 months to see if further callus debridement is necessary.  Follow-Up Instructions: Return in about 3 months (around 05/15/2021).   Ortho Exam  Patient is alert, oriented, no adenopathy, well-dressed, normal affect, normal respiratory effort. Examination right foot patient's foot is plantigrade there is no calluses no ulcers no redness no cellulitis no signs of infection.  Examination of the left foot she does have some plantar calluses under pressure points.  After informed consent a 10 blade knife was used to pare the callus there is no other ulcers or skin breakdown no signs of infection.  Imaging: No results found. No images are attached to the encounter.  Labs: Lab Results  Component Value Date   HGBA1C 6.3 09/01/2020   HGBA1C 6.4 (H) 12/27/2019   HGBA1C 6.5 06/25/2019   REPTSTATUS 08/15/2017 FINAL 08/10/2017   GRAMSTAIN NO WBC SEEN NO ORGANISMS SEEN  08/10/2017   CULT  08/10/2017    No growth aerobically or anaerobically. Performed at [facility], [place] [address] [place], [place] [address]    Doy Hutching  03/11/2016    Three or more  organisms present,each greater than 10,000 CFU/mL.These organisms,commonly found on external and internal genitalia,are considered to be colonizers.No further testing performed.      Lab Results  Component Value Date   ALBUMIN 3.8 09/01/2020   ALBUMIN 3.8 10/26/2019   ALBUMIN 3.5 06/25/2019    No results found for: MG No results found for: VD25OH  No results found for: PREALBUMIN CBC EXTENDED Latest Ref Rng & Units 10/26/2019 01/29/2019 11/18/2018  WBC 4.0 - 10.5 K/uL 9.4 10.6(H) 14.0(H)  RBC 3.87 - 5.11 Mil/uL 4.41 4.55 3.34(L)  HGB 12.0 - 15.0 g/dL 12.6 12.2 9.6(L)  HCT 36.0 - 46.0 % 37.2 39.6 28.8(L)  PLT 150.0 - 400.0 K/uL 237.0 244 164  NEUTROABS 1.4 - 7.7 K/uL 6.5 - -  LYMPHSABS 0.7 - 4.0 K/uL 2.0 - -     There is no height or weight on file to calculate BMI.  Orders:  No orders of the defined types were placed in this encounter.  No orders of the defined types were placed in this encounter.    Procedures: No procedures performed  Clinical Data: No additional findings.  ROS:  All other systems negative, except as noted in the HPI. Review of Systems  Objective: Vital Signs: There were no vitals taken for this visit.  Specialty Comments:  No  specialty comments available.  PMFS History: Patient Active Problem List   Diagnosis Date Noted   Abdominal bloating 10/26/2019   Ulcer of toe of left foot, limited to breakdown of skin ([place]) 07/13/2019   Status post total replacement of left hip 11/16/2018   Vaginal prolapse 09/30/2018   Primary osteoarthritis of left hip 06/10/2018   Osteomyelitis of second toe of right foot ([place]) 04/24/2018   Osteomyelitis of right foot ([place]) 04/21/2018   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 ([place]) 04/17/2018   Callus 01/13/2018   Generalized anxiety disorder 10/16/2017   Menopause 10/16/2017   DM (diabetes mellitus) type II uncontrolled, periph vascular disorder 10/16/2017   Hyperlipidemia associated with type 2  diabetes mellitus ([place]) 10/16/2017   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 ([place] [employer]) 10/14/2017   Osteomyelitis of great toe of right foot ([place]) 09/25/2017   Pain in right foot 09/16/2017   Cellulitis 08/08/2017   Hypoglycemia without diagnosis of diabetes mellitus 08/08/2017   Peripheral neuropathy 08/08/2017   Abscess 08/08/2017   Hyperlipidemia LDL goal <70 09/22/2016   Tooth pain 09/03/2012   Edema 09/03/2012   Supraclavicular fossa fullness 06/18/2012   Hair loss 09/18/2010   Fatigue 09/18/2010   DYSPEPSIA&OTHER SPEC DISORDERS FUNCTION STOMACH 05/08/2010   DIARRHEA 05/08/2010   DIZZINESS 04/19/2010   OTHER ACUTE SINUSITIS 02/28/2010   NAUSEA 02/28/2010   Pain in right ankle and joints of right foot 02/15/2010   URI 07/09/2007   GERD 02/12/2007   LOW BACK PAIN 02/12/2007   Hypertension 09/03/2006   HOT FLASHES 09/03/2006   INSOMNIA 09/03/2006   Past Medical History:  Diagnosis Date   Anemia    as a younger woman   Arthritis    "all over my body" (10/01/2017)   Chronic pain    In pain clinic    Constipation    Depression    Diabetic peripheral neuropathy (HCC)    GERD (gastroesophageal reflux disease)    High cholesterol    History of hiatal hernia    Hypertension    Type II diabetes mellitus ([place]) dx'd ~ 2008    Family History  Problem Relation Age of Onset   Cancer Mother        breast   Depression Mother        bipolar   Breast cancer Mother 57   Heart disease Father 6       MI   Diabetes Brother    Kidney disease Brother    Hypertension Brother    Hyperlipidemia Brother    Stroke Brother    Coronary artery disease Other    Diabetes Other     Past Surgical History:  Procedure Laterality Date   AMPUTATION Right 09/29/2017   Procedure: AMPUTATION RIGHT 1ST RAY;  Surgeon: Leandrew Koyanagi, MD;  Location: [place] [facility];  Service: Orthopedics;  Laterality: Right;   AMPUTATION Right 01/29/2019   Procedure: RIGHT TRANSMETATARSAL AMPUTATION;   Surgeon: Newt Minion, MD;  Location: [facility];  Service: Orthopedics;  Laterality: Right;   AMPUTATION TOE Right 04/24/2018   Procedure: RIGHT 2ND TOE PROXIMAL INTERPHALANGEAL JOINT DISARTICULATION;  Surgeon: Leandrew Koyanagi, MD;  Location: [facility];  Service: Orthopedics;  Laterality: Right;   APPLICATION OF WOUND VAC Right 09/29/2017   "foot"   BLADDER SUSPENSION  07/13/2020   Dr Zigmund Daniel    COLONOSCOPY     I & D EXTREMITY Right 08/10/2017   Procedure: IRRIGATION AND DEBRIDEMENT FOOT;  Surgeon: Leandrew Koyanagi, MD;  Location: [facility];  Service: Orthopedics;  Laterality: Right;   LUMBAR DISC SURGERY  1992   POSTERIOR LUMBAR FUSION  1996   TOE SURGERY Right 1980s   bone removed "inbetween my toes"   TOTAL HIP ARTHROPLASTY Left 11/16/2018   Procedure: LEFT TOTAL HIP ARTHROPLASTY ANTERIOR APPROACH;  Surgeon: Leandrew Koyanagi, MD;  Location: [facility];  Service: Orthopedics;  Laterality: Left;   VAGINAL HYSTERECTOMY     Social History   Occupational History   Not on file  Tobacco Use   Smoking status: Never   Smokeless tobacco: Never  Vaping Use   Vaping Use: Never used  Substance and Sexual Activity   Alcohol use: Never   Drug use: Never   Sexual activity: Not Currently    Partners: Male

## 2021-02-13 ENCOUNTER — Encounter: Payer: Medicare Other | Admitting: Gastroenterology

## 2021-02-15 ENCOUNTER — Other Ambulatory Visit (HOSPITAL_BASED_OUTPATIENT_CLINIC_OR_DEPARTMENT_OTHER): Payer: Self-pay | Admitting: Family Medicine

## 2021-02-15 DIAGNOSIS — Z780 Asymptomatic menopausal state: Secondary | ICD-10-CM

## 2021-02-20 ENCOUNTER — Telehealth (HOSPITAL_BASED_OUTPATIENT_CLINIC_OR_DEPARTMENT_OTHER): Payer: Self-pay

## 2021-03-07 ENCOUNTER — Encounter: Payer: Self-pay | Admitting: Gastroenterology

## 2021-03-27 ENCOUNTER — Inpatient Hospital Stay (HOSPITAL_BASED_OUTPATIENT_CLINIC_OR_DEPARTMENT_OTHER): Admission: RE | Admit: 2021-03-27 | Payer: Medicare Other | Source: Ambulatory Visit

## 2021-04-05 DIAGNOSIS — E1142 Type 2 diabetes mellitus with diabetic polyneuropathy: Secondary | ICD-10-CM | POA: Diagnosis not present

## 2021-04-05 DIAGNOSIS — G894 Chronic pain syndrome: Secondary | ICD-10-CM | POA: Diagnosis not present

## 2021-04-05 DIAGNOSIS — G4700 Insomnia, unspecified: Secondary | ICD-10-CM | POA: Diagnosis not present

## 2021-04-05 DIAGNOSIS — Z79891 Long term (current) use of opiate analgesic: Secondary | ICD-10-CM | POA: Diagnosis not present

## 2021-04-12 ENCOUNTER — Ambulatory Visit (HOSPITAL_BASED_OUTPATIENT_CLINIC_OR_DEPARTMENT_OTHER): Payer: Medicare Other

## 2021-04-12 ENCOUNTER — Other Ambulatory Visit (HOSPITAL_BASED_OUTPATIENT_CLINIC_OR_DEPARTMENT_OTHER): Payer: Medicare Other

## 2021-04-19 ENCOUNTER — Ambulatory Visit (HOSPITAL_BASED_OUTPATIENT_CLINIC_OR_DEPARTMENT_OTHER): Payer: Medicare Other

## 2021-04-19 ENCOUNTER — Other Ambulatory Visit (HOSPITAL_BASED_OUTPATIENT_CLINIC_OR_DEPARTMENT_OTHER): Payer: Medicare Other

## 2021-04-22 ENCOUNTER — Telehealth: Payer: Self-pay | Admitting: Family Medicine

## 2021-04-22 DIAGNOSIS — E1165 Type 2 diabetes mellitus with hyperglycemia: Secondary | ICD-10-CM

## 2021-04-22 DIAGNOSIS — E1110 Type 2 diabetes mellitus with ketoacidosis without coma: Secondary | ICD-10-CM

## 2021-04-22 DIAGNOSIS — Z780 Asymptomatic menopausal state: Secondary | ICD-10-CM

## 2021-04-22 DIAGNOSIS — F411 Generalized anxiety disorder: Secondary | ICD-10-CM

## 2021-04-22 DIAGNOSIS — K219 Gastro-esophageal reflux disease without esophagitis: Secondary | ICD-10-CM

## 2021-04-22 DIAGNOSIS — R609 Edema, unspecified: Secondary | ICD-10-CM

## 2021-04-22 DIAGNOSIS — E1169 Type 2 diabetes mellitus with other specified complication: Secondary | ICD-10-CM

## 2021-04-22 DIAGNOSIS — I10 Essential (primary) hypertension: Secondary | ICD-10-CM

## 2021-04-24 ENCOUNTER — Ambulatory Visit (AMBULATORY_SURGERY_CENTER): Payer: Medicare Other | Admitting: *Deleted

## 2021-04-24 ENCOUNTER — Telehealth: Payer: Self-pay | Admitting: *Deleted

## 2021-04-24 ENCOUNTER — Other Ambulatory Visit: Payer: Self-pay

## 2021-04-24 VITALS — Ht 70.0 in | Wt 172.0 lb

## 2021-04-24 DIAGNOSIS — Z86010 Personal history of colonic polyps: Secondary | ICD-10-CM

## 2021-04-24 MED ORDER — SUTAB 1479-225-188 MG PO TABS: 1.0000 | ORAL_TABLET | ORAL | 0 refills | Status: DC

## 2021-04-24 NOTE — Telephone Encounter (Signed)
PT. CALLED BACK PRE-VISIT COMPLETED.

## 2021-04-24 NOTE — Telephone Encounter (Signed)
1341-CALL PLACED TO DO PRE-VISIT ,CALL BACK NUMBER LEFT FOR PT. TO RETURN CALL BY 5 PM TO RESCHEDULE OR PROCEDURE WILL HAVE TO BE CANCELED.

## 2021-04-24 NOTE — Progress Notes (Signed)
No egg or soy allergy known to patient  No issues known to pt with past sedation with any surgeries or procedures Patient denies ever being told they had issues or difficulty with intubation  No FH of Malignant Hyperthermia Pt is not on diet pills Pt is not on  home 02  Pt is not on blood thinners  Pt denies issues with constipation  No A fib or A flutter  Pt is fully vaccinated  for Covid   SUTAB Coupon to pt in PV today , Code to Pharmacy and  NO PA's for preps discussed with pt In PV today  Discussed with pt there will be an out-of-pocket cost for prep and that varies from $0 to 70 +  dollars - pt verbalized understanding   Due to the COVID-19 pandemic we are asking patients to follow certain guidelines in PV and the [place]   Pt aware of COVID protocols and [facility] guidelines   PV completed over the phone. Pt verified name, DOB, address and insurance during PV today.  Pt mailed instruction packet with copy of consent form to read and not return, and instructions.  Pt encouraged to call with questions or issues.  If pt has My chart, procedure instructions sent via My Chart  S  SAMPLE SHEET FOR OVER THE COUNTER ITEMS TO PURCHASE SENT.

## 2021-04-26 NOTE — Telephone Encounter (Signed)
Patient states she changed insurance and needs all of these medications sent to:  [facility] ([facility] ) - [place], [place]  [address] [place] [address]  Phone:  [phone]  Fax:  [phone]    Furosemide 20 MG  amLODIPine Besylate 5 MG  Potassium Chloride Crys ER 20 MEQ  SITagliptin-metFORMIN HCl  Atorvastatin Calcium 10 MG T Escitalopram Oxalate 10 MG Bisoprolol-hydroCHLOROthiazide  Estradiol 1 MG  gabapentin (NEURONTIN) 600 MG tablet   KLOR-CON M20 20 MEQ tablet   omeprazole (PRILOSEC) 40 MG capsule  tiZANidine (ZANAFLEX) 4 MG tablet  glucose blood (ONE TOUCH ULTRA TEST) test strip   OneTouch Delica Lancets 79K MISC

## 2021-04-27 MED ORDER — FUROSEMIDE 20 MG PO TABS: 20.0000 mg | ORAL_TABLET | Freq: Every day | ORAL | 0 refills | Status: DC

## 2021-04-27 MED ORDER — GLUCOSE BLOOD VI STRP: ORAL_STRIP | 12 refills | Status: DC

## 2021-04-27 MED ORDER — ONETOUCH DELICA LANCETS 33G MISC: 12 refills | Status: DC

## 2021-04-27 MED ORDER — ATORVASTATIN CALCIUM 10 MG PO TABS: 10.0000 mg | ORAL_TABLET | Freq: Every day | ORAL | 0 refills | Status: DC

## 2021-04-27 MED ORDER — OMEPRAZOLE 40 MG PO CPDR: 40.0000 mg | DELAYED_RELEASE_CAPSULE | Freq: Every day | ORAL | 0 refills | Status: DC

## 2021-04-27 MED ORDER — ESTRADIOL 1 MG PO TABS: 1.0000 mg | ORAL_TABLET | Freq: Every day | ORAL | 0 refills | Status: DC

## 2021-04-27 MED ORDER — JANUMET 50-1000 MG PO TABS: 2.0000 | ORAL_TABLET | Freq: Every day | ORAL | 0 refills | Status: DC

## 2021-04-27 MED ORDER — ESCITALOPRAM OXALATE 10 MG PO TABS: 10.0000 mg | ORAL_TABLET | Freq: Every day | ORAL | 0 refills | Status: DC

## 2021-04-27 MED ORDER — BISOPROLOL-HYDROCHLOROTHIAZIDE 10-6.25 MG PO TABS: 1.0000 | ORAL_TABLET | Freq: Every day | ORAL | 0 refills | Status: DC

## 2021-04-27 MED ORDER — KLOR-CON M20 20 MEQ PO TBCR: EXTENDED_RELEASE_TABLET | ORAL | 0 refills | Status: DC

## 2021-04-27 MED ORDER — AMLODIPINE BESYLATE 5 MG PO TABS: 5.0000 mg | ORAL_TABLET | Freq: Every day | ORAL | 0 refills | Status: DC

## 2021-04-27 MED ORDER — TIZANIDINE HCL 4 MG PO TABS: ORAL_TABLET | ORAL | 0 refills | Status: DC

## 2021-04-27 NOTE — Telephone Encounter (Signed)
Refills sent

## 2021-04-27 NOTE — Addendum Note (Signed)
Addended by: Sanda Linger on: 04/27/2021 09:37 AM   Modules accepted: Orders

## 2021-05-01 ENCOUNTER — Telehealth: Payer: Medicare Other

## 2021-05-01 ENCOUNTER — Encounter: Payer: Self-pay | Admitting: Gastroenterology

## 2021-05-03 ENCOUNTER — Telehealth (HOSPITAL_BASED_OUTPATIENT_CLINIC_OR_DEPARTMENT_OTHER): Payer: Self-pay

## 2021-05-04 ENCOUNTER — Ambulatory Visit (HOSPITAL_BASED_OUTPATIENT_CLINIC_OR_DEPARTMENT_OTHER): Payer: Medicare Other

## 2021-05-07 ENCOUNTER — Other Ambulatory Visit (HOSPITAL_BASED_OUTPATIENT_CLINIC_OR_DEPARTMENT_OTHER): Payer: Medicare Other

## 2021-05-08 ENCOUNTER — Encounter: Payer: Medicare Other | Admitting: Gastroenterology

## 2021-05-11 ENCOUNTER — Ambulatory Visit (INDEPENDENT_AMBULATORY_CARE_PROVIDER_SITE_OTHER): Payer: Medicare Other | Admitting: Pharmacist

## 2021-05-11 DIAGNOSIS — E1110 Type 2 diabetes mellitus with ketoacidosis without coma: Secondary | ICD-10-CM

## 2021-05-11 DIAGNOSIS — E1169 Type 2 diabetes mellitus with other specified complication: Secondary | ICD-10-CM

## 2021-05-11 DIAGNOSIS — F411 Generalized anxiety disorder: Secondary | ICD-10-CM

## 2021-05-11 DIAGNOSIS — K219 Gastro-esophageal reflux disease without esophagitis: Secondary | ICD-10-CM

## 2021-05-11 DIAGNOSIS — I10 Essential (primary) hypertension: Secondary | ICD-10-CM

## 2021-05-11 NOTE — Patient Instructions (Signed)
Brittany [name],  It was a pleasure speaking with you today.  I have attached a summary of our visit today and information about your health goals.   Patient Goals/Self-Care Activities Over the next 180 days, patient will:  take medications as prescribed Check glucose every other day, document, and provide at future appointments,  check blood pressure once per week, document, and provide at future appointments, and  target a minimum of 150 minutes of moderate intensity exercise weekly Get annual eye exam Reschedule appointments for mammogram, bone density and colonoscopy.   Our next appointment is by telephone on May 10th, 2023 at 1:45pm  Please call the care guide team at [phone] if you need to cancel or reschedule your appointment.   If you have any questions or concerns, please feel free to contact me either at the phone number below or with a MyChart message.   Keep up the good work!  Cherre Robins, PharmD Clinical Pharmacist [facility] [phone] (direct line)  [phone] (main office number)     CARE PLAN ENTRY  Current Barriers:  Chronic Disease Management support, education, and care coordination needs related to Diabetes, Hypertension, Hyperlipidemia, Depression, Neuropathy/Pain, Muscle Spasm, GERD, Estrogen Deficiency   Hypertension Pharmacist Clinical Goal(s): Over the next 90 days, patient will work with PharmD and providers to maintain BP goal <140/90 BP Readings from Last 3 Encounters:  09/01/20 122/70  12/27/19 120/84  12/02/19 135/85   Current regimen:  Amlodipine 5mg  daily Bisoprolol-hydrochlorothiazide 10-6.25mg  daily furosemide 20mg  every other day Intervention:  Reviewed refill history Patient self care activities - Over the next 90 days, patient will: Maintain hypertension medication regimen.  Check blood pressure once per week, record and bring to future appointments.   Hyperlipidemia Pharmacist  Clinical Goal(s): Over the next 90 days, patient will work with PharmD and providers to maintain LDL goal <70 Lipid Panel     Component Value Date/Time   CHOL 155 09/01/2020 0906   TRIG 85.0 09/01/2020 0906   HDL 65.80 09/01/2020 0906   CHOLHDL 2 09/01/2020 0906   VLDL 17.0 09/01/2020 0906   LDLCALC 72 09/01/2020 0906   LDLCALC 70 12/27/2019 1333    Current regimen:  Atorvastatin 10mg  daily Patient self care activities - Over the next 90 days, patient will: Maintain cholesterol medication regimen.   Diabetes Pharmacist Clinical Goal(s): Over the next 90 days, patient will work with PharmD and providers to maintain A1c goal <7% Lab Results  Component Value Date   HGBA1C 6.3 09/01/2020   Current regimen:  Janumet 50-1000mg  2 tablets daily at bedtime Patient self care activities - Over the next 90 days, patient will: Maintain diabetes medication regimen Check blood glucose once daily, record and bring to future appointments Discussed increasing exercise - walking, Silver Sneakers at [facility] or on [employer] program recommended. Increase to goal of at least 150 minutes per week.  GERD / acid reflux Pharmacist Clinical Goal(s) Over the next 90 days, patient will work with PharmD and providers to reduce symptoms of GERD Current regimen:  Omeprazole 40mg  daily Tums (calcium carbonate) - take as needed for heart burn symptoms Patient self care activities - Over the next 90 days, patient will: Continue current medication for acid reflux.   Estrogen Deficiency Pharmacist Clinical Goal(s) Over the next 90 days, patient will work with PharmD and providers to reduce symptoms of estrogen deficiency and polypharmacy Current regimen:  Estradiol 1mg  daily Patient self care activities - Over the next 82  days, patient will: Inform PCP or Pharmacist if hot flashes recur  General Anxiety:  Pharmacist Clinical Goal(s) Over the next 90 days, patient will work with PharmD and  providers to maintain control of anxiety / depression Current regimen:  Escitalopram 10mg  daily Doxepin 10mg  - take 1 capsule at bedtime as needed for sleep Interventions: Discussed grief process  Patient self care activities - Over the next 90 days, patient will: Continue current medication therapy   Health Maintenance:  Reviewed vaccination history and discussed benefits of annual flu, COVID booster and Shingrix vaccinations Patient to get the following vaccines: Annual flu vaccine - has received from [facility] already per patient COVID bivalent booster - plans to get in next 2 to 3 weeks Shingrix - better coverage of Shingrix in 2023. Discussed benefits of getting Shingrix Reminded to get annual eye exam Reminded to rescheduled appointments for mammogram and bone density Reminded to rescheduled appointment for colonoscopy  Medication management Pharmacist Clinical Goal(s): Over the next 90 days, patient will work with PharmD and providers to maintain optimal medication adherence Current pharmacy: [facility]  Interventions Comprehensive medication review performed. Continue current medication management strategy Medication list updated Patient self care activities - Over the next 90 days, patient will: Focus on medication adherence by filling medications appropriately  Take medications as prescribed Report any questions or concerns to PharmD and/or provider(s)    Zoster Vaccine, Recombinant injection What is this medication? ZOSTER VACCINE (ZOS ter vak SEEN) is a vaccine used to reduce the risk of getting shingles. This vaccine is not used to treat shingles or nerve pain from shingles. This medicine may be used for other purposes; ask your health care provider or pharmacist if you have questions. COMMON BRAND NAME(S): [facility] What should I tell my care team before I take this medication? They need to know if you have any of these conditions: cancer immune  system problems an unusual or allergic reaction to Zoster vaccine, other medications, foods, dyes, or preservatives pregnant or trying to get pregnant breast-feeding How should I use this medication? This vaccine is injected into a muscle. It is given by a health care provider. A copy of Vaccine Information Statements will be given before each vaccination. Be sure to read this information carefully each time. This sheet may change often. Talk to your health care provider about the use of this vaccine in children. This vaccine is not approved for use in children. Overdosage: If you think you have taken too much of this medicine contact a poison control center or emergency room at once. NOTE: This medicine is only for you. Do not share this medicine with others. What if I miss a dose? Keep appointments for follow-up (booster) doses. It is important not to miss your dose. Call your health care provider if you are unable to keep an appointment. What may interact with this medication? medicines that suppress your immune system medicines to treat cancer steroid medicines like prednisone or cortisone This list may not describe all possible interactions. Give your health care provider a list of all the medicines, herbs, non-prescription drugs, or dietary supplements you use. Also tell them if you smoke, drink alcohol, or use illegal drugs. Some items may interact with your medicine. What should I watch for while using this medication? Visit your health care provider regularly. This vaccine, like all vaccines, may not fully protect everyone. What side effects may I notice from receiving this medication? Side effects that you should report to your doctor or  health care professional as soon as possible: allergic reactions (skin rash, itching or hives; swelling of the face, lips, or tongue) trouble breathing Side effects that usually do not require medical attention (report these to your doctor or health  care professional if they continue or are bothersome): chills headache fever nausea pain, redness, or irritation at site where injected tiredness vomiting This list may not describe all possible side effects. Call your doctor for medical advice about side effects. You may report side effects to FDA at 1-800-FDA-1088. Where should I keep my medication? This vaccine is only given by a health care provider. It will not be stored at home. NOTE: This sheet is a summary. It may not cover all possible information. If you have questions about this medicine, talk to your doctor, pharmacist, or health care provider.  2022 Elsevier/Gold Standard (2020-12-12 00:00:00)   The patient verbalized understanding of instructions, educational materials, and care plan provided today and agreed to receive a mailed copy of patient instructions, educational materials, and care plan.

## 2021-05-11 NOTE — Chronic Care Management (AMB) (Signed)
Chronic Care Management Pharmacy Note  05/11/2021 Name:  Brittany [name] MRN:  [id number] DOB:  01-Sep-1945  Summary:  Reviewed medications and updated medication list  Reviewed needed vaccinations, annual eye exam, mammogram, bone density and colonoscopy  Subjective: Brittany [name] is an 76 y.o. year old female who is a primary patient of Ann Held, DO.  The CCM team was consulted for assistance with disease management and care coordination needs.    Engaged with patient by telephone for follow up visit in response to provider referral for pharmacy case management and/or care coordination services.   Consent to Services:  The patient was given information about Chronic Care Management services, agreed to services, and gave verbal consent prior to initiation of services.  Please see initial visit note for detailed documentation.   Patient Care Team: Carollee Herter, Alferd Apa, DO as PCP - General Nicholaus Bloom, MD as Consulting Physician (Neurology) Milus Banister, MD as Attending Physician (Gastroenterology) Leandrew Koyanagi, MD as Attending Physician (Orthopedic Surgery) Cherre Robins, RPH-CPP (Pharmacist)  Recent office visits: 08/24/2020 - PCP (Dr Etter Sjogren) Video Visit; no med changes  Recent consult visits: 02/12/2021 - Ortho (Dr Sharol Given) F/U left foot ulcer (non pressure, chronic) No med changes. F/U in 3 months.  01/04/2021 - Ortho Surgery (Persons, [place]) F/U Foot / Toe ulcer. No medication changes noted 12/07/2020 - Ortho Surgery (Dr Sharol Given) F/U cellulitis. Debrided plantan ulcer of left foot. No medications started or changed.  11/29/2020 - Gyn Urology - (Dr Zigmund Daniel) F/U  s/p anterior/posterior colporrhaphy and bilateral SSLF with ureteral stent placement on 07/13/20 for massive Stage IV VVP. Noted to be constipated. Recommended Fleets Enema. Start Miralax daily to help with bowel movements. F/U 1 year. 11/29/2020 - Ortho Surgery (Persons, PA) seen for osteomyelitis of right  foot and cellulitis. Restarted cephalexin 525m 3 times a day. Compression stocking. F/u 1 week. 11/15/2020 - Ortho [place] (Dr ZCoralyn Pear Seen for f/u cellulitis. Improved. Completed antibiotics. No medication changes. F/U 2 weeks 11/03/2020 - Ortho Surgery (Dr ZCoralyn Pear Cellulitis of right lower extremity. Prescried cephalexin 5034m3 times a day. F/U in 2 weeks    Hospital visits: Not in last 6 months  Objective:  Lab Results  Component Value Date   CREATININE 0.70 09/01/2020   CREATININE 0.66 12/27/2019   CREATININE 0.67 10/26/2019    Lab Results  Component Value Date   HGBA1C 6.3 09/01/2020   Last diabetic Eye exam:  Lab Results  Component Value Date/Time   HMDIABEYEEXA No Retinopathy 10/28/2019 12:00 AM   HMDIABEYEEXA No Retinopathy 10/28/2019 12:00 AM    Last diabetic Foot exam: No results found for: HMDIABFOOTEX      Component Value Date/Time   CHOL 155 09/01/2020 0906   TRIG 85.0 09/01/2020 0906   HDL 65.80 09/01/2020 0906   CHOLHDL 2 09/01/2020 0906   VLDL 17.0 09/01/2020 0906   LDLCALC 72 09/01/2020 0906   LDLCALC 70 12/27/2019 1333    Hepatic Function Latest Ref Rng & Units 09/01/2020 12/27/2019 10/26/2019  Total Protein 6.0 - 8.3 g/dL 6.8 6.1 6.7  Albumin 3.5 - 5.2 g/dL 3.8 - 3.8  AST 0 - 37 U/L _0 ALT 0 - 35 U/L _1 Alk Phosphatase 39 - 117 U/L 53 - 58  Total Bilirubin 0.2 - 1.2 mg/dL 0.4 0.2 0.2  Bilirubin, Direct 0.0 - 0.3 mg/dL - - -    Lab Results  Component Value Date/Time   TSH 1.39  05/15/2017 02:49 PM   TSH 0.46 06/18/2012 10:26 AM   FREET4 1.11 09/18/2010 12:08 PM   FREET4 0.9 02/09/2009 02:01 PM    CBC Latest Ref Rng & Units 10/26/2019 01/29/2019 11/18/2018  WBC 4.0 - 10.5 K/uL 9.4 10.6(H) 14.0(H)  Hemoglobin 12.0 - 15.0 g/dL 12.6 12.2 9.6(L)  Hematocrit 36.0 - 46.0 % 37.2 39.6 28.8(L)  Platelets 150.0 - 400.0 K/uL 237.0 244 164    No results found for: VD25OH  Clinical ASCVD: Yes  The 10-year ASCVD risk score (Arnett DK, et  al., 2019) is: 25.7%   Values used to calculate the score:     Age: 56 years     Sex: Female     Is Non-Hispanic African American: No     Diabetic: Yes     Tobacco smoker: No     Systolic Blood Pressure: 403 mmHg     Is BP treated: Yes     HDL Cholesterol: 65.8 mg/dL     Total Cholesterol: 155 mg/dL     Social History   Tobacco Use  Smoking Status Never  Smokeless Tobacco Never   BP Readings from Last 3 Encounters:  09/01/20 122/70  12/27/19 120/84  12/02/19 135/85   Pulse Readings from Last 3 Encounters:  09/01/20 (!) 56  12/27/19 63  12/02/19 66   Wt Readings from Last 3 Encounters:  04/24/21 172 lb (78 kg)  01/25/21 185 lb (83.9 kg)  12/27/19 185 lb 9.6 oz (84.2 kg)    Assessment: Review of patient past medical history, allergies, medications, health status, including review of consultants reports, laboratory and other test data, was performed as part of comprehensive evaluation and provision of chronic care management services.   SDOH:  (Social Determinants of Health) assessments and interventions performed:     CCM Care Plan  Allergies  Allergen Reactions   Sulfonamide Derivatives Hives    Medications Reviewed Today     Reviewed by Cherre Robins, RPH-CPP (Pharmacist) on 05/11/21 at [place] List Status: <None>   Medication Order Taking? Sig Documenting Provider Last Dose Status Informant  acetaminophen (TYLENOL) 500 MG tablet [id number]  Take by mouth as needed. [provider]  Active   amLODipine (NORVASC) 5 MG tablet [id number]  Take 1 tablet (5 mg total) by mouth daily. Ann Held, DO  Active   aspirin EC 81 MG tablet [id number] Yes Take 1 tablet (81 mg total) by mouth 2 (two) times daily.  Patient taking differently: Take 81 mg by mouth at bedtime.   Leandrew Koyanagi, MD Taking Active   atorvastatin (LIPITOR) 10 MG tablet [id number] Yes Take 1 tablet (10 mg total) by mouth daily. Roma Schanz R, DO Taking Active    bisoprolol-hydrochlorothiazide [facility]) 10-6.25 MG tablet [id number] Yes Take 1 tablet by mouth daily. Roma Schanz R, DO Taking Active   Blood Glucose Monitoring Suppl (ONE TOUCH ULTRA MINI) w/Device KIT [id number] Yes Use as directed once a day.  Dx Code:  B76.28 Carollee Herter, Alferd Apa, DO Taking Active Self  calcium carbonate (TUMS - DOSED IN MG ELEMENTAL CALCIUM) 500 MG chewable tablet [id number] Yes Chew 1 tablet by mouth as needed for indigestion or heartburn. [provider] Taking Active   Cholecalciferol 25 MCG (1000 UT) tablet [id number] Yes Take by mouth. [provider] Taking Active   docusate sodium (COLACE) 100 MG capsule [id number] Yes Take 100 mg by mouth 2 (two) times daily. [provider] Taking Active Self  doxepin (  SINEQUAN) 10 MG capsule [id number] Yes Take 10 mg by mouth at bedtime as needed. [provider] Taking Active   escitalopram (LEXAPRO) 10 MG tablet [id number] Yes Take 1 tablet (10 mg total) by mouth daily. Ann Held, DO Taking Active   estradiol (ESTRACE) 1 MG tablet [id number] Yes Take 1 tablet (1 mg total) by mouth daily. Ann Held, DO Taking Active   ferrous sulfate 325 (65 FE) MG tablet [id number] No Take 325 mg by mouth daily with breakfast.  Patient not taking: Reported on 04/24/2021   [provider] Not Taking Active Self  furosemide (LASIX) 20 MG tablet [id number] Yes Take 1 tablet (20 mg total) by mouth daily.  Patient taking differently: Take 20 mg by mouth every other day.   Ann Held, DO Taking Active   gabapentin (NEURONTIN) 600 MG tablet [id number] Yes Take 600 mg by mouth 4 (four) times daily. [provider] Taking Active   glucose blood (ONE TOUCH ULTRA TEST) test strip [id number] Yes Check Blood sugar once daily Ann Held, DO Taking Active   KLOR-CON M20 20 MEQ tablet [id number] Yes TAKE 1 TABLET BY MOUTH ONCE DAILY Carollee Herter, Alferd Apa, DO Taking Active    morphine (MS CONTIN) 30 MG 12 hr tablet [id number] Yes Take 30 mg by mouth every 12 (twelve) hours. Per Dr. Nicholaus Bloom with Pain Mgmt [provider] Taking Active Self  Multiple Vitamin (MULTIVITAMIN) capsule [id number] Yes Take 1 capsule by mouth daily. [provider] Taking Active   omeprazole (PRILOSEC) 40 MG capsule [id number] Yes Take 1 capsule (40 mg total) by mouth daily. Ann Held, DO Taking Active   OneTouch Delica Lancets 24Q [place] [id number] Yes Check blood sugar once daily Carollee Herter, Kendrick Fries R, DO Taking Active   polyethylene glycol (MIRALAX / GLYCOLAX) 17 g packet [id number] No Take 17 g by mouth daily.  Patient not taking: Reported on 05/11/2021   [provider] Not Taking Active   sitaGLIPtin-metformin (JANUMET) 50-1000 MG tablet [id number] Yes Take 2 tablets by mouth at bedtime.  Patient taking differently: Take 2 tablets by mouth daily. With evening meal   Carollee Herter, Kendrick Fries R, DO Taking Active   Sodium Sulfate-Mag Sulfate-KCl (SUTAB) [phone] MG TABS [id number] No Take 1 kit by mouth as directed.  Patient not taking: Reported on 05/11/2021   Milus Banister, MD Not Taking Active   Specialty Vitamins Products (BIOTIN PLUS KERATIN) 10000-100 MCG-MG TABS [id number] Yes Take 1 tablet by mouth daily. [provider] Taking Active Self  tiZANidine (ZANAFLEX) 4 MG tablet [id number] Yes TAKE 1 TABLET BY MOUTH 3  TIMES DAILY AS NEEDED FOR  MUSCLE SPASM(S) Ann Held, DO Taking Active             Patient Active Problem List   Diagnosis Date Noted   Abdominal bloating 10/26/2019   Ulcer of toe of left foot, limited to breakdown of skin ([place]) 07/13/2019   Status post total replacement of left hip 11/16/2018   Vaginal prolapse 09/30/2018   Primary osteoarthritis of left hip 06/10/2018   Osteomyelitis of second toe of right foot ([place]) 04/24/2018   Osteomyelitis of right foot ([place]) 04/21/2018   Non-pressure chronic ulcer of  right lower leg ([place]) 04/17/2018   Callus 01/13/2018   Generalized anxiety disorder 10/16/2017   Menopause 10/16/2017   DM (diabetes mellitus) type II uncontrolled, periph vascular disorder 10/16/2017   Hyperlipidemia associated with type 2  diabetes mellitus ([place]) 10/16/2017   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 ([place]) 10/14/2017   Osteomyelitis of great toe of right foot ([place]) 09/25/2017   Pain in right foot 09/16/2017   Cellulitis 08/08/2017   Hypoglycemia without diagnosis of diabetes mellitus 08/08/2017   Peripheral neuropathy 08/08/2017   Abscess 08/08/2017   Hyperlipidemia LDL goal <70 09/22/2016   Tooth pain 09/03/2012   Edema 09/03/2012   Supraclavicular fossa fullness 06/18/2012   Hair loss 09/18/2010   Fatigue 09/18/2010   DYSPEPSIA&OTHER SPEC DISORDERS FUNCTION STOMACH 05/08/2010   DIARRHEA 05/08/2010   DIZZINESS 04/19/2010   OTHER ACUTE SINUSITIS 02/28/2010   NAUSEA 02/28/2010   Pain in right ankle and joints of right foot 02/15/2010   URI 07/09/2007   GERD 02/12/2007   LOW BACK PAIN 02/12/2007   Hypertension 09/03/2006   HOT FLASHES 09/03/2006   INSOMNIA 09/03/2006    Immunization History  Administered Date(s) Administered   DTP 07/14/2002   Fluad Quad(high Dose 65+) 12/27/2019   Influenza Whole 02/12/2007   Influenza, High Dose Seasonal PF 02/11/2014, 02/10/2015, 02/10/2015, 03/26/2018   Influenza,inj,Quad PF,6+ Mos 05/14/2013, 02/19/2016   Pneumococcal Conjugate-13 05/04/2015   Pneumococcal Polysaccharide-23 05/14/2013, 03/26/2018   Tdap 03/26/2018   Zoster, Live 01/02/2010    Conditions to be addressed/monitored: HTN, HLD, DMII, Anxiety, and GERD; PVD; insomnia; neuropathy  Care Plan : General Pharmacy (Adult)  Updates made by Cherre Robins, RPH-CPP since 05/11/2021 12:00 AM     Problem: Type 2 DM; HTN; hyperlipidemia; GAD; chronic pain; anemia; edema; neuropathy; PVD; GERD   Priority: High     Long-Range Goal: Chronic  Care and Medication Management Pharmacy Goals   Start Date: 10/26/2020  Priority: High  Note:   Current Barriers:  Education needed regarding medication therapy and chronic conditions management.  Health maintenance needs to be addressed   Pharmacist Clinical Goal(s):  Over the next 180 days, patient will achieve adherence to monitoring guidelines and medication adherence to achieve therapeutic efficacy maintain control of hypertension , hyperlipidemia and type 2 diabetes as evidenced by attainment of goals listed below and prevention of complications  Address health maintenance needs  through collaboration with PharmD and provider.   Interventions: 1:1 collaboration with Carollee Herter, Alferd Apa, DO regarding development and update of comprehensive plan of care as evidenced by provider attestation and co-signature Inter-disciplinary care team collaboration (see longitudinal plan of care) Comprehensive medication review performed; medication list updated in electronic medical record   Hypertension Controlled;  BP goal <140/90 Home blood pressure usually 130 to 142 / 80's States edema is mild - mostly in evenings.  Current regimen:  Amlodipine 71m daily Bisoprolol-hydrochlorothiazide  10-6.25mg daily furosemide 280mevery other day Intervention:  Reviewed refill history - Amlodipine and bisoprolol HCTZ last filled #90 on 04/27/2021 Recommended maintain current hypertension medication regimen.  Recommended check blood pressure once per week, record and bring to future appointments.   Hyperlipidemia / PVD Last LDL was slightly elevated at 72 but previous LDL was 70; LDL goal <70 Current regimen:  Atorvastatin 1038maily Aspirin 33m78mily  Interventions:  Maintain cholesterol medication regimen.  Reviewed refill history - last atorvastatin refill for 90 days 04/27/2021  Diabetes Controlled; A1c goal <7% Has not been checking blood glucose regularly Current regimen:  Janumet  50-1000mg56mablets daily with evening meal Diet: patient is limiting intake of sugar, bread and starchy vegetable (potatoes and corn)  Exercise: not currently (discussed at last visit but due to going  between her home and her son's home due to granddaghters accident she has not started regular exercise yet.  Interventions:  Maintain diabetes medication regimen Continue to check blood glucose every other day, record and bring to future appointments Discussed increasing exercise - walking, Silver Sneakers at [facility] or on [employer] program recommended. Increase to goal of at least 150 minutes per week.   GERD / acid reflux Goal: reduce symptoms of GERD Current regimen:  Omeprazole 59m daily Tums (calcium carbonate) - take as needed for heart burn symptoms Interventions: Continue current medication for acid reflux.   Estrogen Deficiency Goal:  reduce symptoms of estrogen deficiency and polypharmacy Current regimen:  Estradiol 114mdaily Interventions: (addressed at previous appointment)  Discussed risks verses benefits of estrogen replacement  General Anxiety:  Goal: maintain control of anxiety / depression Brother died in [place]2022[place] shooting. His sudden death has saddened Mrs. PaBurklows they were very close.  Granddaughter had a serious auto accident in August 2022 and was in coma.  Today patient repots granddaugher is in rehab and getting better though she cannot walk on her own yet.  Current regimen:  Escitalopram 1048maily Doxepin 39m68mily at bedtime as needed Interventions: Continue current medication therapy  Health Maintenance:  Reviewed vaccination history and discussed benefits of annual flu, COVID booster and Shingrix vaccinations Patient to get the following vaccines: [facility] COVID bivalent booster - plans to get in next 2 to 3 weeks Shingrix - better coverage of Shingrix in 2023. Discussed  benefits of getting Shingrix Reminded to get annual eye exam Reminded to rescheduled appointments for mammogram and bone density Reminded to rescheduled appointment for colonoscopy  Medication management Pharmacist Clinical Goal(s): Over the next 90 days, patient will work with PharmD and providers to maintain optimal medication adherence [facility] Comprehensive medication review performed. Continue current medication management strategy Medication list updated Patient self care activities - Over the next 90 days, patient will: Focus on medication adherence by filling medications appropriately  Take medications as prescribed Report any questions or concerns to PharmD and/or provider(s)  Patient Goals/Self-Care Activities Over the next 180 days, patient will:  take medications as prescribed Check glucose every other day, document, and provide at future appointments,  check blood pressure once per week, document, and provide at future appointments, and  target a minimum of 150 minutes of moderate intensity exercise weekly Get annual eye exam Reschedule appointments for mammogram, bone density and colonoscopy.   Follow Up Plan: Telephone follow up appointment with care management team member scheduled for:  3 months  Please see past updates related to this goal by clicking on the "Past Updates" button in the selected goal        Medication Assistance: None required.  Patient affirms current coverage meets needs.  [id number]: [phone]: [phone] [id number]: [phone]: [phone] Up:  Patient agrees to Care Plan and  Follow-up.  Plan: Telephone follow up appointment with care management team member scheduled for:  3 months  TammCherre RobinsarmD Clinical Pharmacist [phone]

## 2021-05-14 ENCOUNTER — Ambulatory Visit: Payer: Self-pay

## 2021-05-14 ENCOUNTER — Other Ambulatory Visit: Payer: Self-pay

## 2021-05-14 ENCOUNTER — Ambulatory Visit: Payer: Medicare Other | Admitting: Orthopedic Surgery

## 2021-05-14 DIAGNOSIS — M25551 Pain in right hip: Secondary | ICD-10-CM

## 2021-05-14 DIAGNOSIS — M1611 Unilateral primary osteoarthritis, right hip: Secondary | ICD-10-CM | POA: Diagnosis not present

## 2021-05-15 ENCOUNTER — Encounter: Payer: Self-pay | Admitting: Orthopedic Surgery

## 2021-05-15 NOTE — Progress Notes (Signed)
Office Visit Note   Patient: Brittany [name]           Date of Birth: 01-17-1946           MRN: [id number] Visit Date: 05/14/2021              Requested by: Carollee Herter, [place], [place] [address] [place],  [place] [address] PCP: Carollee Herter, Alferd Apa, DO  Chief Complaint  Patient presents with   Left Foot - Wound Check, Follow-up      HPI: Patient is a 76 year old woman who presents complaining of anterior right hip pain.  She states she has pain with getting from sitting to standing position decreased range of motion unable to cross her legs.  Patient also complains of chronic ulcer on the plantar aspect of her foot.  Patient is status post lumbar spine fusion L4-5.  Patient is status post left total hip arthroplasty with Dr. Erlinda Hong in 2020.  Assessment & Plan: Visit Diagnoses:  1. Pain in right hip   2. Unilateral primary osteoarthritis, right hip     Plan:  Patient will follow-up with Dr.Xu for evaluation for right total hip arthroplasty.  I will follow-up in 3 months for evaluation of her foot ulcers and calluses.  Follow-Up Instructions: Return in about 3 months (around 08/11/2021) for Plan for follow-up with Dr. Erlinda Hong for the right hip and I will see him in 3 months.Manson Passey Exam  Patient is alert, oriented, no adenopathy, well-dressed, normal affect, normal respiratory effort. Examination patient has an antalgic gait she has internal rotation of the right hip at 10 degrees external rotation of 20 degrees and attempted range of motion reproduces her symptoms.  She has no focal motor weakness.  She does have hypertrophic callus on the bottom of her foot and this was pared without complication.  No signs of infection.  Imaging: XR HIP UNILAT W OR W/O PELVIS 2-3 VIEWS RIGHT  Result Date: 05/15/2021 2 view radiographs of the right hip shows degenerative arthritic changes with bony spurs and joint space narrowing.  Patient is status post a left total hip arthroplasty  which is stable.  No images are attached to the encounter.  Labs: Lab Results  Component Value Date   HGBA1C 6.3 09/01/2020   HGBA1C 6.4 (H) 12/27/2019   HGBA1C 6.5 06/25/2019   REPTSTATUS 08/15/2017 FINAL 08/10/2017   GRAMSTAIN NO WBC SEEN NO ORGANISMS SEEN  08/10/2017   CULT  08/10/2017    No growth aerobically or anaerobically. Performed at [facility], [place] [address] [place],  [address]    Doy Hutching  03/11/2016    Three or more organisms present,each greater than 10,000 CFU/mL.These organisms,commonly found on external and internal genitalia,are considered to be colonizers.No further testing performed.      Lab Results  Component Value Date   ALBUMIN 3.8 09/01/2020   ALBUMIN 3.8 10/26/2019   ALBUMIN 3.5 06/25/2019    No results found for: MG No results found for: VD25OH  No results found for: PREALBUMIN CBC EXTENDED Latest Ref Rng & Units 10/26/2019 01/29/2019 11/18/2018  WBC 4.0 - 10.5 K/uL 9.4 10.6(H) 14.0(H)  RBC 3.87 - 5.11 Mil/uL 4.41 4.55 3.34(L)  HGB 12.0 - 15.0 g/dL 12.6 12.2 9.6(L)  HCT 36.0 - 46.0 % 37.2 39.6 28.8(L)  PLT 150.0 - 400.0 K/uL 237.0 244 164  NEUTROABS 1.4 - 7.7 K/uL 6.5 - -  LYMPHSABS 0.7 - 4.0 K/uL 2.0 - -  There is no height or weight on file to calculate BMI.  Orders:  Orders Placed This Encounter  Procedures   XR HIP UNILAT W OR W/O PELVIS 2-3 VIEWS RIGHT   No orders of the defined types were placed in this encounter.    Procedures: No procedures performed  Clinical Data: No additional findings.  ROS:  All other systems negative, except as noted in the HPI. Review of Systems  Objective: Vital Signs: There were no vitals taken for this visit.  Specialty Comments:  No specialty comments available.  PMFS History: Patient Active Problem List   Diagnosis Date Noted   Abdominal bloating 10/26/2019   Ulcer of toe of left foot, limited to breakdown of skin ([place]) 07/13/2019   Status post total  replacement of left hip 11/16/2018   Vaginal prolapse 09/30/2018   Primary osteoarthritis of left hip 06/10/2018   Osteomyelitis of second toe of right foot ([place]) 04/24/2018   Osteomyelitis of right foot ([place]) 04/21/2018   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 ([place]) 04/17/2018   Callus 01/13/2018   Generalized anxiety disorder 10/16/2017   Menopause 10/16/2017   DM (diabetes mellitus) type II uncontrolled, periph vascular disorder 10/16/2017   Hyperlipidemia associated with type 2 diabetes mellitus (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 ([place]) 10/14/2017   Osteomyelitis of great toe of right foot ([place]) 09/25/2017   Pain in right foot 09/16/2017   Cellulitis 08/08/2017   Hypoglycemia without diagnosis of diabetes mellitus 08/08/2017   Peripheral neuropathy 08/08/2017   Abscess 08/08/2017   Hyperlipidemia LDL goal <70 09/22/2016   Tooth pain 09/03/2012   Edema 09/03/2012   Supraclavicular fossa fullness 06/18/2012   Hair loss 09/18/2010   Fatigue 09/18/2010   DYSPEPSIA&OTHER SPEC DISORDERS FUNCTION STOMACH 05/08/2010   DIARRHEA 05/08/2010   DIZZINESS 04/19/2010   OTHER ACUTE SINUSITIS 02/28/2010   NAUSEA 02/28/2010   Pain in right ankle and joints of right foot 02/15/2010   URI 07/09/2007   GERD 02/12/2007   LOW BACK PAIN 02/12/2007   Hypertension 09/03/2006   HOT FLASHES 09/03/2006   INSOMNIA 09/03/2006   Past Medical History:  Diagnosis Date   Anemia    as a younger woman   Anxiety    Arthritis    "all over my body" (10/01/2017)   Chronic pain    In pain clinic    Constipation    Depression    Diabetic peripheral neuropathy (HCC)    GERD (gastroesophageal reflux disease)    High cholesterol    History of hiatal hernia    Hypertension    Type II diabetes mellitus ([place] [facility]) dx'd ~ 2008    Family History  Problem Relation Age of Onset   Cancer Mother        breast   Depression Mother        bipolar   Breast cancer  Mother 68   Heart disease Father 21       MI   Diabetes Brother    Kidney disease Brother    Hypertension Brother    Hyperlipidemia Brother    Stroke Brother    Coronary artery disease Other    Diabetes Other    Colon cancer Neg Hx    Colon polyps Neg Hx    Esophageal cancer Neg Hx    Rectal cancer Neg Hx    Stomach cancer Neg Hx     Past Surgical History:  Procedure Laterality Date  AMPUTATION Right 09/29/2017   Procedure: AMPUTATION RIGHT 1ST RAY;  Surgeon: Leandrew Koyanagi, MD;  Location: [facility];  Service: Orthopedics;  Laterality: Right;   AMPUTATION Right 01/29/2019   Procedure: RIGHT TRANSMETATARSAL AMPUTATION;  Surgeon: Newt Minion, MD;  Location: [facility];  Service: Orthopedics;  Laterality: Right;   AMPUTATION TOE Right 04/24/2018   Procedure: RIGHT 2ND TOE PROXIMAL INTERPHALANGEAL JOINT DISARTICULATION;  Surgeon: Leandrew Koyanagi, MD;  Location: [facility];  Service: Orthopedics;  Laterality: Right;   APPLICATION OF WOUND VAC Right 09/29/2017   "foot"   BLADDER SUSPENSION  07/13/2020   Dr Zigmund Daniel    COLONOSCOPY     I & D EXTREMITY Right 08/10/2017   Procedure: IRRIGATION AND DEBRIDEMENT FOOT;  Surgeon: Leandrew Koyanagi, MD;  Location: [facility];  Service: Orthopedics;  Laterality: Right;   LUMBAR DISC SURGERY  1992   POLYPECTOMY     POSTERIOR LUMBAR FUSION  1996   TOE SURGERY Right 1980s   bone removed "inbetween my toes"   TOTAL HIP ARTHROPLASTY Left 11/16/2018   Procedure: LEFT TOTAL HIP ARTHROPLASTY ANTERIOR APPROACH;  Surgeon: Leandrew Koyanagi, MD;  Location: [facility];  Service: Orthopedics;  Laterality: Left;   VAGINAL HYSTERECTOMY     Social History   Occupational History   Not on file  Tobacco Use   Smoking status: Never   Smokeless tobacco: Never  Vaping Use   Vaping Use: Never used  Substance and Sexual Activity   Alcohol use: Never   Drug use: Never   Sexual activity: Not Currently    Partners: Male

## 2021-05-29 ENCOUNTER — Telehealth (HOSPITAL_BASED_OUTPATIENT_CLINIC_OR_DEPARTMENT_OTHER): Payer: Self-pay

## 2021-05-29 ENCOUNTER — Telehealth: Payer: Self-pay | Admitting: Family Medicine

## 2021-05-29 NOTE — Telephone Encounter (Signed)
Pt thinks she may have bed sores. She did not want to come into the office as she is in a lot of pain and having trouble moving. She is hoping something can be prescribed. Please advise.   [place], [place] [place] [address], [place] [address]  Phone:  [phone]  Fax:  [phone]

## 2021-05-29 NOTE — Telephone Encounter (Signed)
OV

## 2021-05-30 NOTE — Telephone Encounter (Signed)
Spoke with patient. Pt states sore is closed w/o discharge or bleeding. Pt was offered virtual and office visit and transportation but pt declined due to hip pain and she has no one to help her get out of the house. Pt states she does have A+D ointment at home. I advised her to keep the area clean and dry and to apply a thin layer of ointment and to attempt to keep pressure off that area. Pt was advised to call back with any changes or to contact EMS for transportation.

## 2021-06-05 DIAGNOSIS — E1169 Type 2 diabetes mellitus with other specified complication: Secondary | ICD-10-CM | POA: Diagnosis not present

## 2021-06-05 DIAGNOSIS — I10 Essential (primary) hypertension: Secondary | ICD-10-CM | POA: Diagnosis not present

## 2021-06-05 DIAGNOSIS — E1110 Type 2 diabetes mellitus with ketoacidosis without coma: Secondary | ICD-10-CM | POA: Diagnosis not present

## 2021-06-05 DIAGNOSIS — E785 Hyperlipidemia, unspecified: Secondary | ICD-10-CM | POA: Diagnosis not present

## 2021-06-14 ENCOUNTER — Other Ambulatory Visit: Payer: Self-pay

## 2021-06-21 ENCOUNTER — Other Ambulatory Visit: Payer: Self-pay | Admitting: Family Medicine

## 2021-06-28 ENCOUNTER — Other Ambulatory Visit: Payer: Self-pay | Admitting: Family Medicine

## 2021-06-28 DIAGNOSIS — E1110 Type 2 diabetes mellitus with ketoacidosis without coma: Secondary | ICD-10-CM

## 2021-06-28 DIAGNOSIS — R609 Edema, unspecified: Secondary | ICD-10-CM

## 2021-06-28 DIAGNOSIS — I10 Essential (primary) hypertension: Secondary | ICD-10-CM

## 2021-06-28 DIAGNOSIS — E1169 Type 2 diabetes mellitus with other specified complication: Secondary | ICD-10-CM

## 2021-06-28 DIAGNOSIS — Z780 Asymptomatic menopausal state: Secondary | ICD-10-CM

## 2021-06-28 DIAGNOSIS — F411 Generalized anxiety disorder: Secondary | ICD-10-CM

## 2021-07-06 ENCOUNTER — Ambulatory Visit: Payer: Medicare Other | Admitting: Family Medicine

## 2021-07-10 ENCOUNTER — Ambulatory Visit (INDEPENDENT_AMBULATORY_CARE_PROVIDER_SITE_OTHER): Payer: Medicare Other | Admitting: Family Medicine

## 2021-07-10 ENCOUNTER — Encounter: Payer: Self-pay | Admitting: Family Medicine

## 2021-07-10 VITALS — BP 120/70 | HR 59 | Temp 98.6°F | Resp 18 | Ht 70.0 in | Wt 190.2 lb

## 2021-07-10 DIAGNOSIS — E785 Hyperlipidemia, unspecified: Secondary | ICD-10-CM

## 2021-07-10 DIAGNOSIS — E1169 Type 2 diabetes mellitus with other specified complication: Secondary | ICD-10-CM

## 2021-07-10 DIAGNOSIS — Z01818 Encounter for other preprocedural examination: Secondary | ICD-10-CM

## 2021-07-10 DIAGNOSIS — I10 Essential (primary) hypertension: Secondary | ICD-10-CM | POA: Diagnosis not present

## 2021-07-10 DIAGNOSIS — E1165 Type 2 diabetes mellitus with hyperglycemia: Secondary | ICD-10-CM

## 2021-07-10 NOTE — Progress Notes (Signed)
? ?Subjective:  ? ?By signing my name below, I, Shehryar Baig, attest that this documentation has been prepared under the direction and in the presence of Ann Held, DO  07/10/2021 ?   ? ? Patient ID: Brittany [name], female    DOB: 1945-06-17, 76 y.o.   MRN: [id number] ? ?Chief Complaint  ?Patient presents with  ? Surgical Clearance  ?  Right hip replacement  ? ? ?HPI ?Patient is in today for a surgical clearance visit.  ? ?She is requesting surgical clearance for a total right hip replacement.  ?Her EKG results are normal. Dr. Erlinda Hong is operating her right total hip procedure. She is having spinal anesthesia. She had no issues with anesthesia in the past. She has an upcomming pre-op appointment this Thursday, 07/12/2021. She does not consistently check her blood sugar at home. She continues taking 50-1000 mg Janumet daily PO and reports no new issues while taking it.  ?She can walk at this time but has difficulty walking long distances due to pain.  ? ? ?Past Medical History:  ?Diagnosis Date  ? Anemia   ? as a younger woman  ? Anxiety   ? Arthritis   ? "all over my body" (10/01/2017)  ? Chronic pain   ? In pain clinic   ? Constipation   ? Depression   ? Diabetic peripheral neuropathy ([facility])   ? GERD (gastroesophageal reflux disease)   ? High cholesterol   ? History of hiatal hernia   ? Hypertension   ? Type II diabetes mellitus ([facility]) dx'd ~ 2008  ? ? ?Past Surgical History:  ?Procedure Laterality Date  ? AMPUTATION Right 09/29/2017  ? Procedure: AMPUTATION RIGHT 1ST RAY;  Surgeon: Leandrew Koyanagi, MD;  Location: [facility];  Service: Orthopedics;  Laterality: Right;  ? AMPUTATION Right 01/29/2019  ? Procedure: RIGHT TRANSMETATARSAL AMPUTATION;  Surgeon: Newt Minion, MD;  Location: [facility];  Service: Orthopedics;  Laterality: Right;  ? AMPUTATION TOE Right 04/24/2018  ? Procedure: RIGHT 2ND TOE PROXIMAL INTERPHALANGEAL JOINT DISARTICULATION;  Surgeon: Leandrew Koyanagi, MD;  Location: [facility];   Service: Orthopedics;  Laterality: Right;  ? APPLICATION OF WOUND VAC Right 09/29/2017  ? "foot"  ? BLADDER SUSPENSION  07/13/2020  ? Dr Zigmund Daniel   ? COLONOSCOPY    ? I & D EXTREMITY Right 08/10/2017  ? Procedure: IRRIGATION AND DEBRIDEMENT FOOT;  Surgeon: Leandrew Koyanagi, MD;  Location: [facility];  Service: Orthopedics;  Laterality: Right;  ? [place]  ? POLYPECTOMY    ? [place]  ? TOE SURGERY Right 1980s  ? bone removed "inbetween my toes"  ? TOTAL HIP ARTHROPLASTY Left 11/16/2018  ? Procedure: LEFT TOTAL HIP ARTHROPLASTY ANTERIOR APPROACH;  Surgeon: Leandrew Koyanagi, MD;  Location: [facility];  Service: Orthopedics;  Laterality: Left;  ? VAGINAL HYSTERECTOMY    ? ? ?Family History  ?Problem Relation Age of Onset  ? Cancer Mother   ?     breast  ? Depression Mother   ?     bipolar  ? Breast cancer Mother 59  ? Heart disease Father 7  ?     MI  ? Diabetes Brother   ? Kidney disease Brother   ? Hypertension Brother   ? Hyperlipidemia Brother   ? Stroke Brother   ? Coronary artery disease Other   ? Diabetes Other   ? Colon cancer Neg Hx   ?  Colon polyps Neg Hx   ? Esophageal cancer Neg Hx   ? Rectal cancer Neg Hx   ? Stomach cancer Neg Hx   ? ? ?Social History  ? ?Socioeconomic History  ? Marital status: Widowed  ?  Spouse name: Not on file  ? Number of children: Not on file  ? Years of education: Not on file  ? Highest education level: Not on file  ?Occupational History  ? Not on file  ?Tobacco Use  ? Smoking status: Never  ? Smokeless tobacco: Never  ?Vaping Use  ? Vaping Use: Never used  ?Substance and Sexual Activity  ? Alcohol use: Never  ? Drug use: Never  ? Sexual activity: Not Currently  ?  Partners: Male  ?Other Topics Concern  ? Not on file  ?Social History Narrative  ? Not on file  ? ?Social Determinants of Health  ? ?Financial Resource Strain: Low Risk   ? Difficulty of Paying Living Expenses: Not hard at all  ?Food Insecurity: No Food Insecurity  ? Worried About Sales executive in the Last Year: Never true  ? Ran Out of Food in the Last Year: Never true  ?Transportation Needs: No Transportation Needs  ? Lack of Transportation (Medical): No  ? Lack of Transportation (Non-Medical): No  ?Physical Activity: Insufficiently Active  ? Days of Exercise per Week: 3 days  ? Minutes of Exercise per Session: 10 min  ?Stress: Stress Concern Present  ? Feeling of Stress : Rather much  ?Social Connections: Not on file  ?Intimate Partner Violence: Not At Risk  ? Fear of Current or Ex-Partner: No  ? Emotionally Abused: No  ? Physically Abused: No  ? Sexually Abused: No  ? ? ?Outpatient Medications Prior to Visit  ?Medication Sig Dispense Refill  ? amLODipine (NORVASC) 5 MG tablet Take 1 tablet (5 mg total) by mouth daily. 90 tablet 0  ? atorvastatin (LIPITOR) 10 MG tablet Take 1 tablet (10 mg total) by mouth daily. 90 tablet 0  ? bisoprolol-hydrochlorothiazide (ZIAC) 10-6.25 MG tablet Take 1 tablet by mouth daily. 90 tablet 0  ? Blood Glucose Monitoring Suppl (ONE TOUCH ULTRA MINI) w/Device KIT Use as directed once a day.  Dx Code:  E11.42 1 each 0  ? escitalopram (LEXAPRO) 10 MG tablet Take 1 tablet (10 mg total) by mouth daily. 90 tablet 0  ? estradiol (ESTRACE) 1 MG tablet Take 1 tablet (1 mg total) by mouth daily. 90 tablet 0  ? furosemide (LASIX) 20 MG tablet Take 1 tablet (20 mg total) by mouth daily. (Patient taking differently: Take 20 mg by mouth every other day.) 90 tablet 0  ? gabapentin (NEURONTIN) 600 MG tablet Take 600 mg by mouth 4 (four) times daily.    ? glucose blood (ONE TOUCH ULTRA TEST) test strip Check Blood sugar once daily 100 each 12  ? KLOR-CON M20 20 MEQ tablet TAKE 1 TABLET BY MOUTH ONCE DAILY 90 tablet 0  ? morphine (MS CONTIN) 30 MG 12 hr tablet Take 30 mg by mouth every 12 (twelve) hours. Per Dr. Nicholaus Bloom with Pain Mgmt  0  ? Multiple Vitamin (MULTIVITAMIN) capsule Take 1 capsule by mouth daily.    ? omeprazole (PRILOSEC) 40 MG capsule TAKE 1 CAPSULE BY MOUTH DAILY  90 capsule 3  ? OneTouch Delica Lancets 57Q MISC Check blood sugar once daily 100 each 12  ? sitaGLIPtin-metformin (JANUMET) 50-1000 MG tablet Take 2 tablets by mouth at bedtime. (Patient taking  differently: Take 2 tablets by mouth daily. With evening meal) 180 tablet 0  ? tiZANidine (ZANAFLEX) 4 MG tablet TAKE 1 TABLET BY MOUTH 3  TIMES DAILY AS NEEDED FOR  MUSCLE SPASM(S) 270 tablet 0  ? acetaminophen (TYLENOL) 500 MG tablet Take by mouth as needed.    ? aspirin EC 81 MG tablet Take 1 tablet (81 mg total) by mouth 2 (two) times daily. (Patient taking differently: Take 81 mg by mouth at bedtime.) 84 tablet 0  ? calcium carbonate (TUMS - DOSED IN MG ELEMENTAL CALCIUM) 500 MG chewable tablet Chew 1 tablet by mouth as needed for indigestion or heartburn.    ? Cholecalciferol 25 MCG (1000 UT) tablet Take by mouth.    ? docusate sodium (COLACE) 100 MG capsule Take 100 mg by mouth 2 (two) times daily.    ? doxepin (SINEQUAN) 10 MG capsule Take 10 mg by mouth at bedtime as needed.    ? Specialty Vitamins Products (BIOTIN PLUS KERATIN) 10000-100 MCG-MG TABS Take 1 tablet by mouth daily.    ? ferrous sulfate 325 (65 FE) MG tablet Take 325 mg by mouth daily with breakfast. (Patient not taking: Reported on 04/24/2021)    ? polyethylene glycol (MIRALAX / GLYCOLAX) 17 g packet Take 17 g by mouth daily. (Patient not taking: Reported on 05/11/2021)    ? Sodium Sulfate-Mag Sulfate-KCl (SUTAB) [phone] MG TABS Take 1 kit by mouth as directed. (Patient not taking: Reported on 05/11/2021) 24 tablet 0  ? ?No facility-administered medications prior to visit.  ? ? ?Allergies  ?Allergen Reactions  ? Sulfonamide Derivatives Hives  ? ? ?Review of Systems  ?Constitutional:  Negative for fever and malaise/fatigue.  ?HENT:  Negative for congestion.   ?Eyes:  Negative for blurred vision.  ?Respiratory:  Negative for cough and shortness of breath.   ?Cardiovascular:  Negative for chest pain, palpitations and leg swelling.  ?Gastrointestinal:   Negative for vomiting.  ?Musculoskeletal:  Negative for back pain.  ?Skin:  Negative for rash.  ?Neurological:  Negative for loss of consciousness and headaches.  ? ?   ?Objective:  ?  ?Physical Exam ?Vitals a

## 2021-07-10 NOTE — Patient Instructions (Signed)
Preparing for Hip Replacement Preparing before hip replacement surgery can make recovery easier and more comfortable. The information below gives some tips and guidelines that willhelp to prepare for this surgery. Talk with your health care provider so you can learn what to expect before,during, and after surgery. Ask questions if you do not understand something. Tell a health care provider about: Any allergies you have. All medicines you are taking, including vitamins, herbs, eye drops, creams, and over-the-counter medicines. Any problems you or family members have had with anesthetic medicines. Any blood disorders you have. Any surgeries you have had. Any medical conditions you have. Whether you are pregnant or may be pregnant. What happens before the procedure? Health care provider visits You will need to have a physical exam before you are scheduled for surgery (preoperative exam) to make sure it is safe for you to have surgery. This may require you to have more tests. When you go to the preoperative exam, bring a list of all themedicines, vitamins, herbs, and supplements that you take. Have dental care and routine cleanings done before surgery. Germs from anywhere in your body, including your mouth, can travel to your new joint and infect it.Tell your dentist that you plan to have hip replacement surgery. Surgery costs To find out how much surgery will cost, call your insurance company as soon as you decide to have surgery. Ask questions such as: How much of the surgery and hospital stay will be covered? What will be covered for the following items? Medical equipment. Rehabilitation facilities. Home care. Medicines. Preparing your home Pick a recovery spot that is not your bed. During recovery, it is better that you sit more upright than you can in your bed. Here are some tips for choosing a recovery spot: Choose a chair with arms and a high, firm seat that will not allow you to sink  down into it. Chairs and sofas that are too soft can allow your hip to bend at an angle greater than 90 degrees. This could put you at risk for moving your new hip joint out of place (dislocating it). Place items that you often use on a small table next to your chair. These items may include the TV remote control, a mobile phone, a book, a laptop computer, and a water glass. You may be given a walker to use at home. Check if you will have enough room to use a walker. Move around your home with your hands out about 6 inches (15 cm) from your sides. You will have enough room if you do not hit anything with your hands as you do this. Walk from: Your recovery spot to your kitchen and bathroom. Your bed to the bathroom. Apply these general tips: Move the items you use most often to shelves and drawers at countertop height. Do this in your kitchen, bathroom, and bedroom. Prepare some meals to freeze and reheat later. Home safety     Remove all clutter and throw rugs from your floors. Doing this will help you avoid tripping. Consider getting safety equipment that will be helpful during your recovery, such as: Grab bars in the shower and near the toilet. A raised toilet seat. This will help you get on and off the toilet more easily. A tub or shower bench. Preparing your body If you smoke, quit as soon as you can before surgery. If there is time, it is best to quit several months before surgery. Tell your surgeon if you use any products   that contain nicotine or tobacco, such as cigarettes, e-cigarettes, and chewing tobacco. These can delay healing. If you need help quitting, ask your health care provider. Maintain a healthy diet. Do not change your diet before surgery unless your health care provider tells you to do that. Do not drink any alcohol for at least 48 hours before surgery. Talk to your health care provider about doing exercises before your surgery. Be sure to follow the exercise program given  by your health care provider. Doing these exercises in the weeks before your surgery may help reduce pain and improve function after surgery. Recovery planning In the first couple of weeks after surgery, it will be harder for you to do some of your regular activities. You may get tired easily, and you may have limited movement in your leg. To make sure you have all the help you need after your surgery: Plan to have a responsible adult take you home from the hospital. Your health care provider will tell you how many days you can expect to be in the hospital. Cancel all of your work, caregiving, and volunteer responsibilities for at least 4-6 weeks after surgery. Plan to have a responsible adult stay with you both day and night for the first week. This person should be someone you are comfortable with. You may need this person to help you with your exercises and personal care, such as bathing and using the toilet. If you live alone, arrange for someone to take care of your home and pets for the first 4-6 weeks after surgery. You may not be able to drive for 4-6 weeks. Plan to have someone drive you on errands and to appointments. Consider applying for a disability parking permit. To get an application, call your local Department of Motor Vehicles (DMV) or your health care provider's office. Summary Getting prepared before hip replacement surgery can make your recovery easier and more comfortable. Keep all of your preoperative appointments to make sure that you are ready for your surgery. Prepare your home and arrange for help at home. Plan to have a responsible adult take you home from the hospital and stay with you day and night for the first week. This information is not intended to replace advice given to you by your health care provider. Make sure you discuss any questions you have with your healthcare provider. Document Revised: 08/19/2019 Document Reviewed: 08/19/2019 Elsevier Patient Education   2022 [employer]  

## 2021-07-11 LAB — CBC WITH DIFFERENTIAL/PLATELET
Basophils Absolute: 0.1 10*3/uL (ref 0.0–0.1)
Basophils Relative: 1.1 % (ref 0.0–3.0)
Eosinophils Absolute: 0.1 10*3/uL (ref 0.0–0.7)
Eosinophils Relative: 1.5 % (ref 0.0–5.0)
HCT: 37.6 % (ref 36.0–46.0)
Hemoglobin: 12.3 g/dL (ref 12.0–15.0)
Lymphocytes Relative: 27 % (ref 12.0–46.0)
Lymphs Abs: 2.5 10*3/uL (ref 0.7–4.0)
MCHC: 32.6 g/dL (ref 30.0–36.0)
MCV: 82.1 fl (ref 78.0–100.0)
Monocytes Absolute: 0.9 10*3/uL (ref 0.1–1.0)
Monocytes Relative: 9.6 % (ref 3.0–12.0)
Neutro Abs: 5.6 10*3/uL (ref 1.4–7.7)
Neutrophils Relative %: 60.8 % (ref 43.0–77.0)
Platelets: 275 10*3/uL (ref 150.0–400.0)
RBC: 4.58 Mil/uL (ref 3.87–5.11)
RDW: 15.6 % — ABNORMAL HIGH (ref 11.5–15.5)
WBC: 9.3 10*3/uL (ref 4.0–10.5)

## 2021-07-11 LAB — COMPREHENSIVE METABOLIC PANEL
ALT: 14 U/L (ref 0–35)
AST: 19 U/L (ref 0–37)
Albumin: 4.2 g/dL (ref 3.5–5.2)
Alkaline Phosphatase: 51 U/L (ref 39–117)
BUN: 17 mg/dL (ref 6–23)
CO2: 31 mEq/L (ref 19–32)
Calcium: 9.8 mg/dL (ref 8.4–10.5)
Chloride: 100 mEq/L (ref 96–112)
Creatinine, Ser: 0.75 mg/dL (ref 0.40–1.20)
GFR: 79.05 mL/min (ref >60.00)
Glucose, Bld: 104 mg/dL — ABNORMAL HIGH (ref 70–99)
Potassium: 4.1 mEq/L (ref 3.5–5.1)
Sodium: 139 mEq/L (ref 135–145)
Total Bilirubin: 0.4 mg/dL (ref 0.2–1.2)
Total Protein: 6.8 g/dL (ref 6.0–8.3)

## 2021-07-11 LAB — LIPID PANEL
Cholesterol: 175 mg/dL (ref 0–200)
HDL: 81.6 mg/dL (ref >39.00)
LDL Cholesterol: 76 mg/dL (ref 0–99)
NonHDL: 93.06
Total CHOL/HDL Ratio: 2
Triglycerides: 84 mg/dL (ref 0.0–149.0)
VLDL: 16.8 mg/dL (ref 0.0–40.0)

## 2021-07-11 LAB — MICROALBUMIN / CREATININE URINE RATIO
Creatinine,U: 103.8 mg/dL
Microalb Creat Ratio: 0.7 mg/g (ref 0.0–30.0)
Microalb, Ur: 0.8 mg/dL (ref 0.0–1.9)

## 2021-07-11 LAB — HEMOGLOBIN A1C: Hgb A1c MFr Bld: 7.2 % — ABNORMAL HIGH (ref 4.6–6.5)

## 2021-07-11 NOTE — Progress Notes (Signed)
Surgical Instructions ? ? ? Your procedure is scheduled on Monday, April 17th, 2023. ? ? Report to [facility] Main Entrance "A" at 11:15 A.M., then check in with the Admitting office. ? Call this number if you have problems the morning of surgery: ? [phone] ? ? If you have any questions prior to your surgery date call [phone]: Open Monday-Friday 8am-4pm ? ? ? Remember: ? Do not eat after midnight the night before your surgery ? ?You may drink clear liquids until 10:15 the morning of your surgery.   ?Clear liquids allowed are: Water, Non-Citrus Juices (without pulp), Carbonated Beverages, Clear Tea, Black Coffee ONLY (NO MILK, CREAM OR POWDERED CREAMER of any kind), and Gatorade ? ?Patient Instructions ? ?The day of surgery (if you have diabetes): ?Drink ONE (1) 12 oz G2 given to you in your pre admission testing appointment by 10:15 the morning of surgery. Drink in one sitting. Do not sip.  ?This drink was given to you during your hospital  ?pre-op appointment visit.  ?Nothing else to drink after completing the  ?12 oz bottle of G2. ? ?       If you have questions, please contact your surgeon?s office.  ?  ? Take these medicines the morning of surgery with A SIP OF WATER:  ? ?amLODipine (NORVASC)  ?atorvastatin (LIPITOR) ?escitalopram (LEXAPRO) ?gabapentin (NEURONTIN)  ?morphine (MS CONTIN) ?omeprazole (PRILOSEC) ? ?If needed: ? ?Carboxymethylcellul-Glycerin (LUBRICATING EYE DROPS OP) ?tiZANidine (ZANAFLEX) ? ? ?Follow your surgeon's instructions on when to stop Aspirin.  If no instructions were given by your surgeon then you will need to call the office to get those instructions.    ? ?As of today, STOP taking any Aspirin (unless otherwise instructed by your surgeon) Aleve, Naproxen, Ibuprofen, Motrin, Advil, Goody's, BC's, all herbal medications, fish oil, and all vitamins. ? ? ?WHAT DO I DO ABOUT MY DIABETES MEDICATION? ? ? ?Do not take sitaGLIPtin-metformin (JANUMET) the morning of surgery. ? ? ?HOW TO  MANAGE YOUR DIABETES ?BEFORE AND AFTER SURGERY ? ?Why is it important to control my blood sugar before and after surgery? ?Improving blood sugar levels before and after surgery helps healing and can limit problems. ?A way of improving blood sugar control is eating a healthy diet by: ? Eating less sugar and carbohydrates ? Increasing activity/exercise ? Talking with your doctor about reaching your blood sugar goals ?High blood sugars (greater than 180 mg/dL) can raise your risk of infections and slow your recovery, so you will need to focus on controlling your diabetes during the weeks before surgery. ?Make sure that the doctor who takes care of your diabetes knows about your planned surgery including the date and location. ? ?How do I manage my blood sugar before surgery? ?Check your blood sugar at least 4 times a day, starting 2 days before surgery, to make sure that the level is not too high or low. ? ?Check your blood sugar the morning of your surgery when you wake up and every 2 hours until you get to the Short Stay unit. ? ?If your blood sugar is less than 70 mg/dL, you will need to treat for low blood sugar: ?Do not take insulin. ?Treat a low blood sugar (less than 70 mg/dL) with ? cup of clear juice (cranberry or apple), 4 glucose tablets, OR glucose gel. ?Recheck blood sugar in 15 minutes after treatment (to make sure it is greater than 70 mg/dL). If your blood sugar is not greater than 70 mg/dL on recheck,  call [phone] for further instructions. ?Report your blood sugar to the short stay nurse when you get to Short Stay. ? ?If you are admitted to the hospital after surgery: ?Your blood sugar will be checked by the staff and you will probably be given insulin after surgery (instead of oral diabetes medicines) to make sure you have good blood sugar levels. ?The goal for blood sugar control after surgery is 80-180 mg/dL.  ? ? ? The day of surgery: ?         ?Do not wear jewelry or makeup ?Do not wear  lotions, powders, perfumes, or deodorant. ?Do not shave 48 hours prior to surgery.   ?Do not bring valuables to the hospital. ?Do not wear nail polish, gel polish, artificial nails, or any other type of covering on natural nails (fingers and toes) ?If you have artificial nails or gel coating that need to be removed by a nail salon, please have this removed prior to surgery. Artificial nails or gel coating may interfere with anesthesia's ability to adequately monitor your vital signs. ? ? ?[facility] is not responsible for any belongings or valuables. .  ? ?Do NOT Smoke (Tobacco/Vaping)  24 hours prior to your procedure ? ?If you use a CPAP at night, you may bring your mask for your overnight stay. ?  ?Contacts, glasses, hearing aids, dentures or partials may not be worn into surgery, please bring cases for these belongings ?  ?For patients admitted to the hospital, discharge time will be determined by your treatment team. ?  ?Patients discharged the day of surgery will not be allowed to drive home, and someone needs to stay with them for 24 hours. ? ? ?SURGICAL WAITING ROOM VISITATION ?Patients having surgery or a procedure in a hospital may have two support people. ?Children under the age of 76 must have an adult with them who is not the patient. ?They may stay in the waiting area during the procedure and may switch out with other visitors. If the patient needs to stay at the hospital during part of their recovery, the visitor guidelines for inpatient rooms apply. ? ?Please refer to the [facility] website for the visitor guidelines for Inpatients (after your surgery is over and you are in a regular room).  ? ? ? ?Special instructions:   ? ?Oral Hygiene is also important to reduce your risk of infection.  Remember - BRUSH YOUR TEETH THE MORNING OF SURGERY WITH YOUR REGULAR TOOTHPASTE ? ? ?[facility]- Preparing For Surgery ? ?Before surgery, you can play an important role. Because skin is not sterile, your skin  needs to be as free of germs as possible. You can reduce the number of germs on your skin by washing with CHG (chlorahexidine gluconate) Soap before surgery.  CHG is an antiseptic cleaner which kills germs and bonds with the skin to continue killing germs even after washing.   ? ? ?Please do not use if you have an allergy to CHG or antibacterial soaps. If your skin becomes reddened/irritated stop using the CHG.  ?Do not shave (including legs and underarms) for at least 48 hours prior to first CHG shower. It is OK to shave your face. ? ?Please follow these instructions carefully. ?  ? ? Shower the NIGHT BEFORE SURGERY and the MORNING OF SURGERY with CHG Soap.  ? If you chose to wash your hair, wash your hair first as usual with your normal shampoo. After you shampoo, rinse your hair and body thoroughly to  remove the shampoo.  Then [employer] and genitals (private parts) with your normal soap and rinse thoroughly to remove soap. ? ?After that Use CHG Soap as you would any other liquid soap. You can apply CHG directly to the skin and wash gently with a scrungie or a clean washcloth.  ? ?Apply the CHG Soap to your body ONLY FROM THE NECK DOWN.  Do not use on open wounds or open sores. Avoid contact with your eyes, ears, mouth and genitals (private parts). Wash Face and genitals (private parts)  with your normal soap.  ? ?Wash thoroughly, paying special attention to the area where your surgery will be performed. ? ?Thoroughly rinse your body with warm water from the neck down. ? ?DO NOT shower/wash with your normal soap after using and rinsing off the CHG Soap. ? ?Pat yourself dry with a CLEAN TOWEL. ? ?Wear CLEAN PAJAMAS to bed the night before surgery ? ?Place CLEAN SHEETS on your bed the night before your surgery ? ?DO NOT SLEEP WITH PETS. ? ? ?Day of Surgery: ? ?Take a shower with CHG soap. ?Wear Clean/Comfortable clothing the morning of surgery ?Do not apply any deodorants/lotions.   ?Remember to brush your teeth  WITH YOUR REGULAR TOOTHPASTE. ? ? ? ?If you received a COVID test during your pre-op visit  it is requested that you wear a mask when out in public, stay away from anyone that may not be feeling well and notify

## 2021-07-12 ENCOUNTER — Other Ambulatory Visit: Payer: Self-pay

## 2021-07-12 ENCOUNTER — Encounter (HOSPITAL_COMMUNITY): Payer: Self-pay

## 2021-07-12 ENCOUNTER — Encounter (HOSPITAL_COMMUNITY)
Admission: RE | Admit: 2021-07-12 | Discharge: 2021-07-12 | Disposition: A | Discharge status: Home / Self Care | Payer: Medicare Other | Source: Ambulatory Visit | Attending: Orthopaedic Surgery | Admitting: Orthopaedic Surgery

## 2021-07-12 VITALS — BP 120/51 | HR 59 | Temp 98.3°F | Resp 17 | Ht 70.0 in | Wt 191.8 lb

## 2021-07-12 DIAGNOSIS — Z01812 Encounter for preprocedural laboratory examination: Secondary | ICD-10-CM | POA: Insufficient documentation

## 2021-07-12 DIAGNOSIS — Z01818 Encounter for other preprocedural examination: Secondary | ICD-10-CM

## 2021-07-12 DIAGNOSIS — E118 Type 2 diabetes mellitus with unspecified complications: Secondary | ICD-10-CM | POA: Diagnosis not present

## 2021-07-12 LAB — SURGICAL PCR SCREEN
MRSA, PCR: NEGATIVE
Staphylococcus aureus: NEGATIVE

## 2021-07-12 LAB — GLUCOSE, CAPILLARY: Glucose-Capillary: 176 mg/dL — ABNORMAL HIGH (ref 70–99)

## 2021-07-12 LAB — TYPE AND SCREEN
ABO/RH(D): O NEG
Antibody Screen: NEGATIVE

## 2021-07-12 NOTE — Progress Notes (Signed)
PCP:  Roma Schanz, MD ?Cardiologist:  denies ? ?EKG:  07/10/21 ?CXR:  na ?ECHO: denies ?Stress Test:  denies ?Cardiac Cath:  denies ? ?Fasting Blood Sugar- 130's ?Checks Blood Sugar__1_ times a week ? ?ASA: Will call office today for asa instructions ?Blood Thinner: No ? ?OSA/CPAP: No ? ?Covid test not needed ? ?Anesthesia Review: No ? ?Patient denies shortness of breath, fever, cough, and chest pain at PAT appointment. ? ?Patient verbalized understanding of instructions provided today at the PAT appointment.  Patient asked to review instructions at home and day of surgery.   ?

## 2021-07-16 ENCOUNTER — Telehealth: Payer: Self-pay | Admitting: Physician Assistant

## 2021-07-16 ENCOUNTER — Other Ambulatory Visit: Payer: Self-pay | Admitting: Physician Assistant

## 2021-07-16 MED ORDER — CEPHALEXIN 500 MG PO CAPS: 500.0000 mg | ORAL_CAPSULE | Freq: Four times a day (QID) | ORAL | 0 refills | Status: DC

## 2021-07-16 MED ORDER — ONDANSETRON HCL 4 MG PO TABS: 4.0000 mg | ORAL_TABLET | Freq: Three times a day (TID) | ORAL | 0 refills | Status: DC | PRN

## 2021-07-16 MED ORDER — ASPIRIN EC 81 MG PO TBEC: 81.0000 mg | DELAYED_RELEASE_TABLET | Freq: Two times a day (BID) | ORAL | 0 refills | Status: AC

## 2021-07-16 MED ORDER — METHOCARBAMOL 500 MG PO TABS: 500.0000 mg | ORAL_TABLET | Freq: Two times a day (BID) | ORAL | 2 refills | Status: DC | PRN

## 2021-07-16 MED ORDER — DOCUSATE SODIUM 100 MG PO CAPS: 100.0000 mg | ORAL_CAPSULE | Freq: Every day | ORAL | 2 refills | Status: AC | PRN

## 2021-07-16 MED ORDER — OXYCODONE-ACETAMINOPHEN 5-325 MG PO TABS: 1.0000 | ORAL_TABLET | Freq: Four times a day (QID) | ORAL | 0 refills | Status: DC | PRN

## 2021-07-16 NOTE — Telephone Encounter (Signed)
Received call from Ry-pharmacist with [facility] stating patient received a Rx on 06/19/2021 for Morphine Sulfate- 30 day supply  30 mg ER  every 12 hours. Ry advised wanted to make Provider aware.  The number to contact Ry is [phone]  ?

## 2021-07-16 NOTE — Telephone Encounter (Signed)
I left voicemail advising. ?

## 2021-07-16 NOTE — Telephone Encounter (Signed)
Ok, thanks.

## 2021-07-16 NOTE — Telephone Encounter (Signed)
5 days before.

## 2021-07-16 NOTE — Telephone Encounter (Signed)
Pt called states she has surgery on 4/17 and wants to know when to stop taking her Asprin. Please call pt at [phone] ?

## 2021-07-17 MED ORDER — DAPAGLIFLOZIN PROPANEDIOL 5 MG PO TABS: 5.0000 mg | ORAL_TABLET | Freq: Every day | ORAL | 2 refills | Status: DC

## 2021-07-20 MED ORDER — TRANEXAMIC ACID 1000 MG/10ML IV SOLN
2000.0000 mg | INTRAVENOUS | Status: DC
  Filled 2021-07-20: qty 20

## 2021-07-22 ENCOUNTER — Telehealth: Payer: Self-pay | Admitting: *Deleted

## 2021-07-22 NOTE — Assessment & Plan Note (Signed)
Well controlled, no changes to meds. Encouraged heart healthy diet such as the DASH diet and exercise as tolerated.  °

## 2021-07-22 NOTE — Assessment & Plan Note (Signed)
Check labs 

## 2021-07-22 NOTE — Telephone Encounter (Signed)
RNCM attempted multiple calls to reach patient for her Pre-op Ortho bundle call prior to surgery. Unable to reach and left messages requesting call back. ?

## 2021-07-22 NOTE — Assessment & Plan Note (Signed)
Encourage heart healthy diet such as MIND or DASH diet, increase exercise, avoid trans fats, simple carbohydrates and processed foods, consider a krill or fish or flaxseed oil cap daily.  °

## 2021-07-23 ENCOUNTER — Ambulatory Visit (HOSPITAL_COMMUNITY): Payer: Medicare Other

## 2021-07-23 ENCOUNTER — Ambulatory Visit (HOSPITAL_COMMUNITY): Payer: Medicare Other | Admitting: Physician Assistant

## 2021-07-23 ENCOUNTER — Observation Stay (HOSPITAL_COMMUNITY): Payer: Medicare Other

## 2021-07-23 ENCOUNTER — Other Ambulatory Visit: Payer: Self-pay

## 2021-07-23 ENCOUNTER — Ambulatory Visit (HOSPITAL_BASED_OUTPATIENT_CLINIC_OR_DEPARTMENT_OTHER): Payer: Medicare Other | Admitting: Certified Registered"

## 2021-07-23 ENCOUNTER — Encounter (HOSPITAL_COMMUNITY): Payer: Self-pay | Admitting: Orthopaedic Surgery

## 2021-07-23 ENCOUNTER — Observation Stay (HOSPITAL_COMMUNITY)
Admission: RE | Admit: 2021-07-23 | Discharge: 2021-07-25 | Disposition: A | Discharge status: Home Health | Payer: Medicare Other | Attending: Orthopaedic Surgery | Admitting: Orthopaedic Surgery

## 2021-07-23 ENCOUNTER — Encounter (HOSPITAL_COMMUNITY)
Admission: RE | Disposition: A | Discharge status: Home Health | Payer: Self-pay | Source: Home / Self Care | Attending: Orthopaedic Surgery

## 2021-07-23 DIAGNOSIS — Z7982 Long term (current) use of aspirin: Secondary | ICD-10-CM | POA: Insufficient documentation

## 2021-07-23 DIAGNOSIS — E1142 Type 2 diabetes mellitus with diabetic polyneuropathy: Secondary | ICD-10-CM | POA: Insufficient documentation

## 2021-07-23 DIAGNOSIS — Z96642 Presence of left artificial hip joint: Secondary | ICD-10-CM | POA: Diagnosis not present

## 2021-07-23 DIAGNOSIS — Z96641 Presence of right artificial hip joint: Secondary | ICD-10-CM | POA: Diagnosis not present

## 2021-07-23 DIAGNOSIS — I10 Essential (primary) hypertension: Secondary | ICD-10-CM | POA: Diagnosis not present

## 2021-07-23 DIAGNOSIS — Z7984 Long term (current) use of oral hypoglycemic drugs: Secondary | ICD-10-CM

## 2021-07-23 DIAGNOSIS — M1611 Unilateral primary osteoarthritis, right hip: Secondary | ICD-10-CM | POA: Diagnosis not present

## 2021-07-23 DIAGNOSIS — Z79899 Other long term (current) drug therapy: Secondary | ICD-10-CM | POA: Insufficient documentation

## 2021-07-23 DIAGNOSIS — Z471 Aftercare following joint replacement surgery: Secondary | ICD-10-CM | POA: Diagnosis not present

## 2021-07-23 DIAGNOSIS — E119 Type 2 diabetes mellitus without complications: Secondary | ICD-10-CM

## 2021-07-23 HISTORY — PX: TOTAL HIP ARTHROPLASTY: SHX124

## 2021-07-23 LAB — GLUCOSE, CAPILLARY
Glucose-Capillary: 123 mg/dL — ABNORMAL HIGH (ref 70–99)
Glucose-Capillary: 134 mg/dL — ABNORMAL HIGH (ref 70–99)
Glucose-Capillary: 178 mg/dL — ABNORMAL HIGH (ref 70–99)

## 2021-07-23 SURGERY — ARTHROPLASTY, HIP, TOTAL, ANTERIOR APPROACH
Anesthesia: Spinal | Site: Hip | Laterality: Right

## 2021-07-23 MED ORDER — TRANEXAMIC ACID-NACL 1000-0.7 MG/100ML-% IV SOLN
INTRAVENOUS | Status: AC
  Filled 2021-07-23: qty 100

## 2021-07-23 MED ORDER — HYDROMORPHONE HCL 1 MG/ML IJ SOLN
0.2500 mg | INTRAMUSCULAR | Status: DC | PRN
  Administered 2021-07-23 (×3): 0.5 mg via INTRAVENOUS

## 2021-07-23 MED ORDER — GLYCOPYRROLATE PF 0.2 MG/ML IJ SOSY
PREFILLED_SYRINGE | INTRAMUSCULAR | Status: AC
  Filled 2021-07-23: qty 1

## 2021-07-23 MED ORDER — INSULIN ASPART 100 UNIT/ML IJ SOLN: 0.0000 [IU] | INTRAMUSCULAR | Status: DC | PRN

## 2021-07-23 MED ORDER — LIDOCAINE 2% (20 MG/ML) 5 ML SYRINGE
INTRAMUSCULAR | Status: AC
  Filled 2021-07-23: qty 5

## 2021-07-23 MED ORDER — SODIUM CHLORIDE 0.9 % IR SOLN
Status: DC | PRN
  Administered 2021-07-23: 1000 mL

## 2021-07-23 MED ORDER — HYDROMORPHONE HCL 1 MG/ML IJ SOLN
INTRAMUSCULAR | Status: AC
  Filled 2021-07-23: qty 1

## 2021-07-23 MED ORDER — ESCITALOPRAM OXALATE 10 MG PO TABS
10.0000 mg | ORAL_TABLET | Freq: Every day | ORAL | Status: DC
  Administered 2021-07-23 – 2021-07-25 (×3): 10 mg via ORAL
  Filled 2021-07-23 (×3): qty 1

## 2021-07-23 MED ORDER — ONDANSETRON HCL 4 MG/2ML IJ SOLN: 4.0000 mg | Freq: Four times a day (QID) | INTRAMUSCULAR | Status: DC | PRN

## 2021-07-23 MED ORDER — ACETAMINOPHEN 325 MG PO TABS: 325.0000 mg | ORAL_TABLET | Freq: Four times a day (QID) | ORAL | Status: DC | PRN

## 2021-07-23 MED ORDER — OXYCODONE HCL 5 MG PO TABS
10.0000 mg | ORAL_TABLET | ORAL | Status: DC | PRN
  Administered 2021-07-23: 10 mg via ORAL
  Filled 2021-07-23: qty 2

## 2021-07-23 MED ORDER — BUPIVACAINE-MELOXICAM ER 400-12 MG/14ML IJ SOLN
INTRAMUSCULAR | Status: DC | PRN
  Administered 2021-07-23: 400 mg

## 2021-07-23 MED ORDER — OXYCODONE HCL 5 MG PO TABS: 5.0000 mg | ORAL_TABLET | Freq: Once | ORAL | Status: DC | PRN

## 2021-07-23 MED ORDER — AMLODIPINE BESYLATE 5 MG PO TABS
5.0000 mg | ORAL_TABLET | Freq: Every day | ORAL | Status: DC
  Administered 2021-07-25: 5 mg via ORAL
  Filled 2021-07-23 (×2): qty 1

## 2021-07-23 MED ORDER — CEFAZOLIN SODIUM-DEXTROSE 2-4 GM/100ML-% IV SOLN
2.0000 g | Freq: Four times a day (QID) | INTRAVENOUS | Status: AC
  Administered 2021-07-23 – 2021-07-24 (×2): 2 g via INTRAVENOUS
  Filled 2021-07-23 (×2): qty 100

## 2021-07-23 MED ORDER — CEFAZOLIN SODIUM-DEXTROSE 2-4 GM/100ML-% IV SOLN
2.0000 g | INTRAVENOUS | Status: AC
  Administered 2021-07-23: 2 g via INTRAVENOUS

## 2021-07-23 MED ORDER — BISOPROLOL-HYDROCHLOROTHIAZIDE 10-6.25 MG PO TABS
1.0000 | ORAL_TABLET | Freq: Every day | ORAL | Status: DC
  Administered 2021-07-24 – 2021-07-25 (×3): 1 via ORAL
  Filled 2021-07-23 (×4): qty 1

## 2021-07-23 MED ORDER — DEXAMETHASONE SODIUM PHOSPHATE 10 MG/ML IJ SOLN
10.0000 mg | Freq: Once | INTRAMUSCULAR | Status: AC
  Administered 2021-07-24: 10 mg via INTRAVENOUS
  Filled 2021-07-23: qty 1

## 2021-07-23 MED ORDER — HYDROMORPHONE HCL 2 MG PO TABS: 2.0000 mg | ORAL_TABLET | ORAL | Status: DC | PRN

## 2021-07-23 MED ORDER — PROPOFOL 1000 MG/100ML IV EMUL
INTRAVENOUS | Status: AC
  Filled 2021-07-23: qty 100

## 2021-07-23 MED ORDER — TRANEXAMIC ACID-NACL 1000-0.7 MG/100ML-% IV SOLN
1000.0000 mg | Freq: Once | INTRAVENOUS | Status: AC
  Administered 2021-07-23: 1000 mg via INTRAVENOUS
  Filled 2021-07-23: qty 100

## 2021-07-23 MED ORDER — PHENYLEPHRINE 40 MCG/ML (10ML) SYRINGE FOR IV PUSH (FOR BLOOD PRESSURE SUPPORT)
PREFILLED_SYRINGE | INTRAVENOUS | Status: AC
  Filled 2021-07-23: qty 20

## 2021-07-23 MED ORDER — BUPIVACAINE IN DEXTROSE 0.75-8.25 % IT SOLN
INTRATHECAL | Status: DC | PRN
  Administered 2021-07-23: 2 mL via INTRATHECAL

## 2021-07-23 MED ORDER — GLYCOPYRROLATE 0.2 MG/ML IJ SOLN
INTRAMUSCULAR | Status: DC | PRN
  Administered 2021-07-23: .2 mg via INTRAVENOUS

## 2021-07-23 MED ORDER — BUPIVACAINE-MELOXICAM ER 400-12 MG/14ML IJ SOLN
INTRAMUSCULAR | Status: AC
  Filled 2021-07-23: qty 1

## 2021-07-23 MED ORDER — ONDANSETRON HCL 4 MG/2ML IJ SOLN
INTRAMUSCULAR | Status: AC
  Filled 2021-07-23: qty 2

## 2021-07-23 MED ORDER — OXYCODONE HCL 5 MG PO TABS
5.0000 mg | ORAL_TABLET | ORAL | Status: DC | PRN
  Administered 2021-07-24: 10 mg via ORAL
  Filled 2021-07-23: qty 2

## 2021-07-23 MED ORDER — OXYCODONE HCL 5 MG/5ML PO SOLN: 5.0000 mg | Freq: Once | ORAL | Status: DC | PRN

## 2021-07-23 MED ORDER — SODIUM CHLORIDE 0.9 % IV SOLN: INTRAVENOUS | Status: DC

## 2021-07-23 MED ORDER — HYDROMORPHONE HCL 2 MG PO TABS: 1.0000 mg | ORAL_TABLET | ORAL | Status: DC | PRN

## 2021-07-23 MED ORDER — LIDOCAINE HCL (CARDIAC) PF 100 MG/5ML IV SOSY
PREFILLED_SYRINGE | INTRAVENOUS | Status: DC | PRN
  Administered 2021-07-23: 100 mg via INTRATRACHEAL

## 2021-07-23 MED ORDER — ACETAMINOPHEN 500 MG PO TABS
1000.0000 mg | ORAL_TABLET | Freq: Four times a day (QID) | ORAL | Status: AC
  Administered 2021-07-23 – 2021-07-24 (×4): 1000 mg via ORAL
  Filled 2021-07-23 (×4): qty 2

## 2021-07-23 MED ORDER — METHOCARBAMOL 1000 MG/10ML IJ SOLN
500.0000 mg | Freq: Four times a day (QID) | INTRAVENOUS | Status: DC | PRN
  Filled 2021-07-23: qty 5

## 2021-07-23 MED ORDER — LACTATED RINGERS IV SOLN: INTRAVENOUS | Status: DC

## 2021-07-23 MED ORDER — PANTOPRAZOLE SODIUM 40 MG PO TBEC
40.0000 mg | DELAYED_RELEASE_TABLET | Freq: Every day | ORAL | Status: DC
  Administered 2021-07-23 – 2021-07-25 (×3): 40 mg via ORAL
  Filled 2021-07-23 (×3): qty 1

## 2021-07-23 MED ORDER — INSULIN ASPART 100 UNIT/ML IJ SOLN: 0.0000 [IU] | Freq: Every day | INTRAMUSCULAR | Status: DC

## 2021-07-23 MED ORDER — PHENYLEPHRINE 40 MCG/ML (10ML) SYRINGE FOR IV PUSH (FOR BLOOD PRESSURE SUPPORT)
PREFILLED_SYRINGE | INTRAVENOUS | Status: DC | PRN
Start: 2021-07-23 — End: 2021-07-23
  Administered 2021-07-23 (×4): 80 ug via INTRAVENOUS

## 2021-07-23 MED ORDER — DIPHENHYDRAMINE HCL 12.5 MG/5ML PO ELIX: 25.0000 mg | ORAL_SOLUTION | ORAL | Status: DC | PRN

## 2021-07-23 MED ORDER — MIDAZOLAM HCL 2 MG/2ML IJ SOLN
INTRAMUSCULAR | Status: DC | PRN
  Administered 2021-07-23: 2 mg via INTRAVENOUS

## 2021-07-23 MED ORDER — MORPHINE SULFATE ER 15 MG PO TBCR
30.0000 mg | EXTENDED_RELEASE_TABLET | Freq: Two times a day (BID) | ORAL | Status: DC
  Administered 2021-07-24 – 2021-07-25 (×4): 30 mg via ORAL
  Filled 2021-07-23 (×4): qty 2

## 2021-07-23 MED ORDER — TRANEXAMIC ACID-NACL 1000-0.7 MG/100ML-% IV SOLN
1000.0000 mg | INTRAVENOUS | Status: AC
  Administered 2021-07-23: 1000 mg via INTRAVENOUS

## 2021-07-23 MED ORDER — MENTHOL 3 MG MT LOZG: 1.0000 | LOZENGE | OROMUCOSAL | Status: DC | PRN

## 2021-07-23 MED ORDER — METOCLOPRAMIDE HCL 5 MG PO TABS: 5.0000 mg | ORAL_TABLET | Freq: Three times a day (TID) | ORAL | Status: DC | PRN

## 2021-07-23 MED ORDER — OXYCODONE HCL ER 10 MG PO T12A: 10.0000 mg | EXTENDED_RELEASE_TABLET | Freq: Two times a day (BID) | ORAL | Status: DC

## 2021-07-23 MED ORDER — EPHEDRINE SULFATE (PRESSORS) 50 MG/ML IJ SOLN
INTRAMUSCULAR | Status: DC | PRN
  Administered 2021-07-23: 5 mg via INTRAVENOUS
  Administered 2021-07-23 (×2): 10 mg via INTRAVENOUS

## 2021-07-23 MED ORDER — PHENOL 1.4 % MT LIQD: 1.0000 | OROMUCOSAL | Status: DC | PRN

## 2021-07-23 MED ORDER — LACTATED RINGERS IV SOLN: INTRAVENOUS | Status: DC | PRN

## 2021-07-23 MED ORDER — PROMETHAZINE HCL 25 MG/ML IJ SOLN
6.2500 mg | INTRAMUSCULAR | Status: DC | PRN
  Administered 2021-07-23: 6.25 mg via INTRAVENOUS

## 2021-07-23 MED ORDER — ASPIRIN 81 MG PO CHEW
81.0000 mg | CHEWABLE_TABLET | Freq: Two times a day (BID) | ORAL | Status: DC
  Administered 2021-07-23 – 2021-07-25 (×4): 81 mg via ORAL
  Filled 2021-07-23 (×4): qty 1

## 2021-07-23 MED ORDER — DAPAGLIFLOZIN PROPANEDIOL 5 MG PO TABS
5.0000 mg | ORAL_TABLET | Freq: Every day | ORAL | Status: DC
  Administered 2021-07-24 – 2021-07-25 (×2): 5 mg via ORAL
  Filled 2021-07-23 (×2): qty 1

## 2021-07-23 MED ORDER — DOCUSATE SODIUM 100 MG PO CAPS
100.0000 mg | ORAL_CAPSULE | Freq: Two times a day (BID) | ORAL | Status: DC
  Administered 2021-07-23 – 2021-07-25 (×4): 100 mg via ORAL
  Filled 2021-07-23 (×4): qty 1

## 2021-07-23 MED ORDER — VANCOMYCIN HCL 1 G IV SOLR
INTRAVENOUS | Status: DC | PRN
  Administered 2021-07-23: 1000 mg via TOPICAL

## 2021-07-23 MED ORDER — CHLORHEXIDINE GLUCONATE 0.12 % MT SOLN
OROMUCOSAL | Status: AC
Start: 2021-07-23 — End: 2021-07-23
  Administered 2021-07-23: 15 mL
  Filled 2021-07-23: qty 15

## 2021-07-23 MED ORDER — PROPOFOL 500 MG/50ML IV EMUL
INTRAVENOUS | Status: DC | PRN
  Administered 2021-07-23: 150 ug/kg/min via INTRAVENOUS

## 2021-07-23 MED ORDER — POVIDONE-IODINE 10 % EX SWAB
2.0000 "application " | Freq: Once | CUTANEOUS | Status: AC
  Administered 2021-07-23: 2 via TOPICAL

## 2021-07-23 MED ORDER — PRONTOSAN WOUND IRRIGATION OPTIME
TOPICAL | Status: DC | PRN
  Administered 2021-07-23: 1

## 2021-07-23 MED ORDER — GABAPENTIN 600 MG PO TABS
600.0000 mg | ORAL_TABLET | Freq: Four times a day (QID) | ORAL | Status: DC
  Administered 2021-07-23 – 2021-07-25 (×8): 600 mg via ORAL
  Filled 2021-07-23 (×8): qty 1

## 2021-07-23 MED ORDER — VANCOMYCIN HCL 1000 MG IV SOLR
INTRAVENOUS | Status: AC
  Filled 2021-07-23: qty 20

## 2021-07-23 MED ORDER — SORBITOL 70 % SOLN
30.0000 mL | Freq: Every day | Status: DC | PRN
  Filled 2021-07-23: qty 30

## 2021-07-23 MED ORDER — MIDAZOLAM HCL 2 MG/2ML IJ SOLN
INTRAMUSCULAR | Status: AC
  Filled 2021-07-23: qty 2

## 2021-07-23 MED ORDER — CEFAZOLIN SODIUM-DEXTROSE 2-4 GM/100ML-% IV SOLN
INTRAVENOUS | Status: AC
  Filled 2021-07-23: qty 100

## 2021-07-23 MED ORDER — ALUM & MAG HYDROXIDE-SIMETH 200-200-20 MG/5ML PO SUSP: 30.0000 mL | ORAL | Status: DC | PRN

## 2021-07-23 MED ORDER — METOCLOPRAMIDE HCL 5 MG/ML IJ SOLN: 5.0000 mg | Freq: Three times a day (TID) | INTRAMUSCULAR | Status: DC | PRN

## 2021-07-23 MED ORDER — METHOCARBAMOL 500 MG PO TABS
500.0000 mg | ORAL_TABLET | Freq: Four times a day (QID) | ORAL | Status: DC | PRN
  Administered 2021-07-23 – 2021-07-24 (×2): 500 mg via ORAL
  Filled 2021-07-23 (×2): qty 1

## 2021-07-23 MED ORDER — SODIUM CHLORIDE 0.9 % IV SOLN
INTRAVENOUS | Status: DC | PRN
  Administered 2021-07-23: 2000 mg via TOPICAL

## 2021-07-23 MED ORDER — INSULIN ASPART 100 UNIT/ML IJ SOLN
0.0000 [IU] | Freq: Three times a day (TID) | INTRAMUSCULAR | Status: DC
  Administered 2021-07-24 – 2021-07-25 (×5): 3 [IU] via SUBCUTANEOUS

## 2021-07-23 MED ORDER — ONDANSETRON HCL 4 MG PO TABS: 4.0000 mg | ORAL_TABLET | Freq: Four times a day (QID) | ORAL | Status: DC | PRN

## 2021-07-23 MED ORDER — ONDANSETRON HCL 4 MG/2ML IJ SOLN
INTRAMUSCULAR | Status: DC | PRN
  Administered 2021-07-23: 4 mg via INTRAVENOUS

## 2021-07-23 MED ORDER — POLYETHYLENE GLYCOL 3350 17 G PO PACK
17.0000 g | PACK | Freq: Every day | ORAL | Status: DC
  Administered 2021-07-23 – 2021-07-25 (×3): 17 g via ORAL
  Filled 2021-07-23 (×3): qty 1

## 2021-07-23 MED ORDER — 0.9 % SODIUM CHLORIDE (POUR BTL) OPTIME
TOPICAL | Status: DC | PRN
  Administered 2021-07-23: 1000 mL

## 2021-07-23 MED ORDER — EPHEDRINE 5 MG/ML INJ
INTRAVENOUS | Status: AC
  Filled 2021-07-23: qty 5

## 2021-07-23 MED ORDER — PROMETHAZINE HCL 25 MG/ML IJ SOLN
INTRAMUSCULAR | Status: AC
  Filled 2021-07-23: qty 1

## 2021-07-23 MED ORDER — HYDROMORPHONE HCL 1 MG/ML IJ SOLN
0.5000 mg | INTRAMUSCULAR | Status: DC | PRN
  Administered 2021-07-24: 0.5 mg via INTRAVENOUS
  Filled 2021-07-23: qty 1

## 2021-07-23 SURGICAL SUPPLY — 70 items
ADH SKN CLS APL DERMABOND .7 (GAUZE/BANDAGES/DRESSINGS) ×1
BAG COUNTER SPONGE SURGICOUNT (BAG) ×2 IMPLANT
BAG DECANTER FOR FLEXI CONT (MISCELLANEOUS) ×2 IMPLANT
BAG SPNG CNTER NS LX DISP (BAG) ×1
CELLS DAT CNTRL 66122 CELL SVR (MISCELLANEOUS) IMPLANT
COVER PERINEAL POST (MISCELLANEOUS) ×2 IMPLANT
COVER SURGICAL LIGHT HANDLE (MISCELLANEOUS) ×2 IMPLANT
DERMABOND ADVANCED (GAUZE/BANDAGES/DRESSINGS) ×1
DERMABOND ADVANCED .7 DNX12 (GAUZE/BANDAGES/DRESSINGS) IMPLANT
DRAPE C-ARM 42X72 X-RAY (DRAPES) ×2 IMPLANT
DRAPE POUCH INSTRU U-SHP 10X18 (DRAPES) ×2 IMPLANT
DRAPE STERI IOBAN 125X83 (DRAPES) ×2 IMPLANT
DRAPE U-SHAPE 47X51 STRL (DRAPES) ×4 IMPLANT
DRSG AQUACEL AG ADV 3.5X10 (GAUZE/BANDAGES/DRESSINGS) ×2 IMPLANT
DURAPREP 26ML APPLICATOR (WOUND CARE) ×4 IMPLANT
ELECT BLADE 4.0 EZ CLEAN MEGAD (MISCELLANEOUS) ×2
ELECT REM PT RETURN 9FT ADLT (ELECTROSURGICAL) ×2
ELECTRODE BLDE 4.0 EZ CLN MEGD (MISCELLANEOUS) ×1 IMPLANT
ELECTRODE REM PT RTRN 9FT ADLT (ELECTROSURGICAL) ×1 IMPLANT
GLOVE BIOGEL PI IND STRL 7.0 (GLOVE) ×1 IMPLANT
GLOVE BIOGEL PI IND STRL 7.5 (GLOVE) ×4 IMPLANT
GLOVE BIOGEL PI INDICATOR 7.0 (GLOVE) ×1
GLOVE BIOGEL PI INDICATOR 7.5 (GLOVE) ×4
GLOVE ECLIPSE 7.0 STRL STRAW (GLOVE) ×4 IMPLANT
GLOVE SKINSENSE NS SZ7.5 (GLOVE) ×1
GLOVE SKINSENSE STRL SZ7.5 (GLOVE) ×1 IMPLANT
GLOVE SURG UNDER POLY LF SZ7 (GLOVE) ×2 IMPLANT
GLOVE SURG UNDER POLY LF SZ7.5 (GLOVE) ×4 IMPLANT
GOWN STRL REIN XL XLG (GOWN DISPOSABLE) ×2 IMPLANT
GOWN STRL REUS W/ TWL LRG LVL3 (GOWN DISPOSABLE) IMPLANT
GOWN STRL REUS W/ TWL XL LVL3 (GOWN DISPOSABLE) ×1 IMPLANT
GOWN STRL REUS W/TWL LRG LVL3 (GOWN DISPOSABLE)
GOWN STRL REUS W/TWL XL LVL3 (GOWN DISPOSABLE) ×2
HANDPIECE INTERPULSE COAX TIP (DISPOSABLE) ×2
HEAD CERAMIC DELTA 36 PLUS 1.5 (Hips) ×1 IMPLANT
HOOD PEEL AWAY FLYTE STAYCOOL (MISCELLANEOUS) ×4 IMPLANT
IV NS IRRIG 3000ML ARTHROMATIC (IV SOLUTION) ×2 IMPLANT
JET LAVAGE IRRISEPT WOUND (IRRIGATION / IRRIGATOR) ×2
KIT BASIN OR (CUSTOM PROCEDURE TRAY) ×2 IMPLANT
LAVAGE JET IRRISEPT WOUND (IRRIGATION / IRRIGATOR) ×1 IMPLANT
LINER NEUTRAL 52X36MM PLUS 4 (Liner) ×1 IMPLANT
MARKER SKIN DUAL TIP RULER LAB (MISCELLANEOUS) ×2 IMPLANT
NDL SPNL 18GX3.5 QUINCKE PK (NEEDLE) ×1 IMPLANT
NEEDLE SPNL 18GX3.5 QUINCKE PK (NEEDLE) ×2 IMPLANT
PACK TOTAL JOINT (CUSTOM PROCEDURE TRAY) ×2 IMPLANT
PACK UNIVERSAL I (CUSTOM PROCEDURE TRAY) ×2 IMPLANT
PIN SECTOR W/GRIP ACE CUP 52MM (Hips) ×1 IMPLANT
RETRACTOR WND ALEXIS 18 MED (MISCELLANEOUS) IMPLANT
RTRCTR WOUND ALEXIS 18CM MED (MISCELLANEOUS)
SAW OSC TIP CART 19.5X105X1.3 (SAW) ×2 IMPLANT
SCREW 6.5MMX25MM (Screw) ×1 IMPLANT
SET HNDPC FAN SPRY TIP SCT (DISPOSABLE) ×1 IMPLANT
SOLUTION PRONTOSAN WOUND 350ML (IRRIGATION / IRRIGATOR) ×1 IMPLANT
STAPLER VISISTAT 35W (STAPLE) IMPLANT
STEM FEMORAL SZ 6MM STD ACTIS (Stem) ×1 IMPLANT
SUT ETHIBOND 2 V 37 (SUTURE) ×2 IMPLANT
SUT ETHILON 2 0 FS 18 (SUTURE) ×3 IMPLANT
SUT VIC AB 0 CT1 27 (SUTURE) ×2
SUT VIC AB 0 CT1 27XBRD ANBCTR (SUTURE) ×1 IMPLANT
SUT VIC AB 1 CTX 27 (SUTURE) ×1 IMPLANT
SUT VIC AB 1 CTX 36 (SUTURE) ×2
SUT VIC AB 1 CTX36XBRD ANBCTR (SUTURE) ×1 IMPLANT
SUT VIC AB 2-0 CT1 27 (SUTURE) ×4
SUT VIC AB 2-0 CT1 TAPERPNT 27 (SUTURE) ×2 IMPLANT
SYR 50ML LL SCALE MARK (SYRINGE) ×2 IMPLANT
TOWEL GREEN STERILE (TOWEL DISPOSABLE) ×2 IMPLANT
TRAY CATH 16FR W/PLASTIC CATH (SET/KITS/TRAYS/PACK) IMPLANT
TRAY FOLEY W/BAG SLVR 16FR (SET/KITS/TRAYS/PACK) ×2
TRAY FOLEY W/BAG SLVR 16FR ST (SET/KITS/TRAYS/PACK) ×1 IMPLANT
YANKAUER SUCT BULB TIP NO VENT (SUCTIONS) ×2 IMPLANT

## 2021-07-23 NOTE — Transfer of Care (Signed)
Immediate Anesthesia Transfer of Care Note ? ?Patient: Brittany [name] ? ?Procedure(s) Performed: RIGHT TOTAL HIP REPLACEMENT (Right: Hip) ? ?Patient Location: PACU ? ?Anesthesia Type:MAC combined with regional for post-op pain ? ?Level of Consciousness: drowsy, patient cooperative and responds to stimulation ? ?Airway & Oxygen Therapy: Patient Spontanous Breathing ? ?Post-op Assessment: Report given to RN and Post -op Vital signs reviewed and stable ? ?Post vital signs: Reviewed and stable ? ?Last Vitals:  ?Vitals Value Taken Time  ?BP 118/67 07/23/21 1515  ?Temp    ?Pulse 68 07/23/21 1516  ?Resp 14 07/23/21 1516  ?SpO2 97 % 07/23/21 1516  ?Vitals shown include unvalidated device data. ? ?Last Pain:  ?Vitals:  ? 07/23/21 1131  ?TempSrc:   ?PainSc: 10-Worst pain ever  ?   ? ?Patients Stated Pain Goal: 0 (07/23/21 1131) ? ?Complications: No notable events documented. ?

## 2021-07-23 NOTE — H&P (Signed)
? ? ?PREOPERATIVE H&P ? ?Chief Complaint: RIGHT HIP DEGENERATIVE JOINT DISEASE ? ?HPI: ?Brittany [name] is a 76 y.o. female who presents for surgical treatment of RIGHT HIP DEGENERATIVE JOINT DISEASE.  She denies any changes in medical history. ? ?Past Medical History:  ?Diagnosis Date  ? Anemia   ? as a younger woman  ? Anxiety   ? Arthritis   ? "all over my body" (10/01/2017)  ? Chronic pain   ? In pain clinic   ? Constipation   ? Depression   ? Diabetic peripheral neuropathy ([place])   ? GERD (gastroesophageal reflux disease)   ? High cholesterol   ? History of hiatal hernia   ? Hypertension   ? Type II diabetes mellitus ([place]) dx'd ~ 2008  ? ?Past Surgical History:  ?Procedure Laterality Date  ? AMPUTATION Right 09/29/2017  ? Procedure: AMPUTATION RIGHT 1ST RAY;  Surgeon: Leandrew Koyanagi, MD;  Location: [facility];  Service: Orthopedics;  Laterality: Right;  ? AMPUTATION Right 01/29/2019  ? Procedure: RIGHT TRANSMETATARSAL AMPUTATION;  Surgeon: Newt Minion, MD;  Location: [facility];  Service: Orthopedics;  Laterality: Right;  ? AMPUTATION TOE Right 04/24/2018  ? Procedure: RIGHT 2ND TOE PROXIMAL INTERPHALANGEAL JOINT DISARTICULATION;  Surgeon: Leandrew Koyanagi, MD;  Location: [facility];  Service: Orthopedics;  Laterality: Right;  ? APPLICATION OF WOUND VAC Right 09/29/2017  ? "foot"  ? BLADDER SUSPENSION  07/13/2020  ? Dr Zigmund Daniel   ? COLONOSCOPY    ? I & D EXTREMITY Right 08/10/2017  ? Procedure: IRRIGATION AND DEBRIDEMENT FOOT;  Surgeon: Leandrew Koyanagi, MD;  Location: [facility];  Service: Orthopedics;  Laterality: Right;  ? [place]  ? POLYPECTOMY    ? [place]  ? TOE SURGERY Right 1980s  ? bone removed "inbetween my toes"  ? TOTAL HIP ARTHROPLASTY Left 11/16/2018  ? Procedure: LEFT TOTAL HIP ARTHROPLASTY ANTERIOR APPROACH;  Surgeon: Leandrew Koyanagi, MD;  Location: [facility];  Service: Orthopedics;  Laterality: Left;  ? VAGINAL HYSTERECTOMY    ? ?Social History  ? ?Socioeconomic  History  ? Marital status: Widowed  ?  Spouse name: Not on file  ? Number of children: Not on file  ? Years of education: Not on file  ? Highest education level: Not on file  ?Occupational History  ? Not on file  ?Tobacco Use  ? Smoking status: Never  ? Smokeless tobacco: Never  ?Vaping Use  ? Vaping Use: Never used  ?Substance and Sexual Activity  ? Alcohol use: Never  ? Drug use: Never  ? Sexual activity: Not Currently  ?  Partners: Male  ?Other Topics Concern  ? Not on file  ?Social History Narrative  ? Not on file  ? ?Social Determinants of Health  ? ?Financial Resource Strain: Low Risk   ? Difficulty of Paying Living Expenses: Not hard at all  ?Food Insecurity: No Food Insecurity  ? Worried About Charity fundraiser in the Last Year: Never true  ? Ran Out of Food in the Last Year: Never true  ?Transportation Needs: No Transportation Needs  ? Lack of Transportation (Medical): No  ? Lack of Transportation (Non-Medical): No  ?Physical Activity: Insufficiently Active  ? Days of Exercise per Week: 3 days  ? Minutes of Exercise per Session: 10 min  ?Stress: Stress Concern Present  ? Feeling of Stress : Rather much  ?Social Connections: Not on file  ? ?Family  History  ?Problem Relation Age of Onset  ? Cancer Mother   ?     breast  ? Depression Mother   ?     bipolar  ? Breast cancer Mother 64  ? Heart disease Father 67  ?     MI  ? Diabetes Brother   ? Kidney disease Brother   ? Hypertension Brother   ? Hyperlipidemia Brother   ? Stroke Brother   ? Coronary artery disease Other   ? Diabetes Other   ? Colon cancer Neg Hx   ? Colon polyps Neg Hx   ? Esophageal cancer Neg Hx   ? Rectal cancer Neg Hx   ? Stomach cancer Neg Hx   ? ?Allergies  ?Allergen Reactions  ? Sulfonamide Derivatives Hives  ? ?Prior to Admission medications   ?Medication Sig Start Date End Date Taking? Authorizing Provider  ?amLODipine (NORVASC) 5 MG tablet Take 1 tablet (5 mg total) by mouth daily. 04/27/21  Yes Roma Schanz R, DO  ?aspirin EC  81 MG tablet Take 1 tablet (81 mg total) by mouth 2 (two) times daily. To be taken after surgery to prevent blood clots 07/16/21   Aundra Dubin, PA-C  ?atorvastatin (LIPITOR) 10 MG tablet Take 1 tablet (10 mg total) by mouth daily. 04/27/21  Yes Ann Held, DO  ?bisoprolol-hydrochlorothiazide (ZIAC) 10-6.25 MG tablet Take 1 tablet by mouth daily. 04/27/21  Yes Roma Schanz R, DO  ?calcium carbonate (TUMS - DOSED IN MG ELEMENTAL CALCIUM) 500 MG chewable tablet Chew 2 tablets by mouth daily as needed for indigestion or heartburn.   Yes [provider]  ?Carboxymethylcellul-Glycerin (LUBRICATING EYE DROPS OP) Place 1 drop into both eyes daily as needed (dry eyes).   Yes [provider]  ?cephALEXin (KEFLEX) 500 MG capsule Take 1 capsule (500 mg total) by mouth 4 (four) times daily. To be taken after surgery 07/16/21   Aundra Dubin, PA-C  ?docusate sodium (COLACE) 100 MG capsule Take 1 capsule (100 mg total) by mouth daily as needed. 07/16/21 07/16/22  Aundra Dubin, PA-C  ?escitalopram (LEXAPRO) 10 MG tablet Take 1 tablet (10 mg total) by mouth daily. 04/27/21  Yes Ann Held, DO  ?estradiol (ESTRACE) 1 MG tablet Take 1 tablet (1 mg total) by mouth daily. 04/27/21  Yes Roma Schanz R, DO  ?furosemide (LASIX) 20 MG tablet Take 1 tablet (20 mg total) by mouth daily. ?Patient taking differently: Take 20 mg by mouth every other day. 04/27/21  Yes Roma Schanz R, DO  ?gabapentin (NEURONTIN) 600 MG tablet Take 600 mg by mouth 4 (four) times daily. 09/06/20  Yes [provider]  ?KLOR-CON M20 20 MEQ tablet TAKE 1 TABLET BY MOUTH ONCE DAILY 04/27/21  Yes Roma Schanz R, DO  ?methocarbamol (ROBAXIN) 500 MG tablet Take 1 tablet (500 mg total) by mouth 2 (two) times daily as needed. To be taken after surgery 07/16/21   Aundra Dubin, PA-C  ?morphine (MS CONTIN) 30 MG 12 hr tablet Take 30 mg by mouth every 12 (twelve) hours. Per Dr. Nicholaus Bloom with  Pain Mgmt 10/08/17  Yes [provider]  ?Multiple Vitamin (MULTIVITAMIN) capsule Take 1 capsule by mouth daily.   Yes [provider]  ?omeprazole (PRILOSEC) 40 MG capsule TAKE 1 CAPSULE BY MOUTH DAILY 06/22/21  Yes Roma Schanz R, DO  ?ondansetron (ZOFRAN) 4 MG tablet Take 1 tablet (4 mg total) by mouth every  8 (eight) hours as needed for nausea or vomiting. 07/16/21   Aundra Dubin, PA-C  ?oxyCODONE-acetaminophen (PERCOCET) 5-325 MG tablet Take 1 tablet by mouth every 6 (six) hours as needed. To be taken after surgery 07/16/21   Aundra Dubin, PA-C  ?sitaGLIPtin-metformin (JANUMET) 50-1000 MG tablet Take 2 tablets by mouth at bedtime. ?Patient taking differently: Take 2 tablets by mouth daily. With evening meal 04/27/21  Yes Ann Held, DO  ?Specialty Vitamins Products (BIOTIN PLUS KERATIN PO) Take 1 capsule by mouth daily.   Yes [provider]  ?tiZANidine (ZANAFLEX) 4 MG tablet TAKE 1 TABLET BY MOUTH 3  TIMES DAILY AS NEEDED FOR  MUSCLE SPASM(S) 04/27/21  Yes Roma Schanz R, DO  ?VITAMIN D PO Take 1 capsule by mouth daily.   Yes [provider]  ?Blood Glucose Monitoring Suppl (ONE TOUCH ULTRA MINI) w/Device KIT Use as directed once a day.  Dx Code:  E11.42 05/01/17   Ann Held, DO  ?dapagliflozin propanediol (FARXIGA) 5 MG TABS tablet Take 1 tablet (5 mg total) by mouth daily before breakfast. 07/17/21   Carollee Herter, Alferd Apa, DO  ?glucose blood (ONE TOUCH ULTRA TEST) test strip Check Blood sugar once daily 04/27/21   Ann Held, DO  ?OneTouch Delica Lancets 82C MISC Check blood sugar once daily 04/27/21   Ann Held, DO  ? ? ? ?Positive ROS: All other systems have been reviewed and were otherwise negative with the exception of those mentioned in the HPI and as above. ? ?Physical Exam: ?General: Alert, no acute distress ?Cardiovascular: No pedal edema ?Respiratory: No cyanosis, no use of accessory musculature ?GI:  abdomen soft ?Skin: No lesions in the area of chief complaint ?Neurologic: Sensation intact distally ?Psychiatric: Patient is competent for consent with normal mood and affect ?Lymphatic: no lymphedema ? ?MUSCULOS

## 2021-07-23 NOTE — Anesthesia Preprocedure Evaluation (Signed)
Anesthesia Evaluation  ?Patient identified by MRN, date of birth, ID band ?Patient awake ? ? ? ?Reviewed: ?Allergy & Precautions, Patient's Chart, lab work & pertinent test results ? ?Airway ?Mallampati: II ? ?TM Distance: >3 FB ?Neck ROM: Full ? ? ? Dental ?no notable dental hx. ? ?  ?Pulmonary ?neg pulmonary ROS,  ?  ?Pulmonary exam normal ?breath sounds clear to auscultation ? ? ? ? ? ? Cardiovascular ?hypertension, Pt. on medications and Pt. on home beta blockers ?Normal cardiovascular exam ?Rhythm:Regular Rate:Normal ? ? ?  ?Neuro/Psych ?Anxiety Depression  Neuromuscular disease   ? GI/Hepatic ?Neg liver ROS, hiatal hernia, GERD  Medicated,  ?Endo/Other  ?diabetes, Type 2, Oral Hypoglycemic Agents ? Renal/GU ?negative Renal ROS  ? ?  ?Musculoskeletal ? ?(+) Arthritis ,  ? Abdominal ?  ?Peds ? Hematology ? ?(+) Blood dyscrasia, anemia ,   ?Anesthesia Other Findings ? ? Reproductive/Obstetrics ? ?  ? ? ? ? ? ? ? ? ? ? ? ? ? ?  ?  ? ? ? ? ? ? ? ? ?Anesthesia Physical ? ?Anesthesia Plan ? ?ASA: III ? ?Anesthesia Plan: Spinal  ? ?Post-op Pain Management:   ? ?Induction: Intravenous ? ?PONV Risk Score and Plan: 2 and Ondansetron, Treatment may vary due to age or medical condition and Midazolam ? ?Airway Management Planned: Simple Face Mask ? ?Additional Equipment: None ? ?Intra-op Plan:  ? ?Post-operative Plan:  ? ?Informed Consent: I have reviewed the patients History and Physical, chart, labs and discussed the procedure including the risks, benefits and alternatives for the proposed anesthesia with the patient or authorized representative who has indicated his/her understanding and acceptance.  ? ? ? ?Dental advisory given ? ?Plan Discussed with: CRNA and Anesthesiologist ? ?Anesthesia Plan Comments:   ? ? ? ? ? ? ?Anesthesia Quick Evaluation ? ?

## 2021-07-23 NOTE — Anesthesia Procedure Notes (Signed)
Procedure Name: [place] ?Date/Time: 07/23/2021 1:13 PM ?Performed by: Claris Che, CRNA ?Pre-anesthesia Checklist: Patient identified, Emergency Drugs available, Suction available, Patient being monitored and Timeout performed ?Oxygen Delivery Method: Simple face mask ? ? ? ? ?

## 2021-07-23 NOTE — Anesthesia Procedure Notes (Signed)
Spinal ? ?Patient location during procedure: OR ?Start time: 07/23/2021 1:05 PM ?End time: 07/23/2021 1:10 PM ?Reason for block: surgical anesthesia ?Staffing ?Performed: anesthesiologist  ?Anesthesiologist: Lynda Rainwater, MD ?Preanesthetic Checklist ?Completed: patient identified, IV checked, site marked, risks and benefits discussed, surgical consent, monitors and equipment checked, pre-op evaluation and timeout performed ?Spinal Block ?Patient position: sitting ?Prep: DuraPrep ?Patient monitoring: heart rate, cardiac monitor, continuous pulse ox and blood pressure ?Approach: midline ?Location: L3-4 ?Injection technique: single-shot ?Needle ?Needle type: Quincke  ?Needle gauge: 22 G ?Needle length: 9 cm ?Assessment ?Sensory level: T4 ?Events: CSF return ? ? ? ?

## 2021-07-23 NOTE — Discharge Instructions (Signed)

## 2021-07-23 NOTE — Op Note (Signed)
? ?RIGHT TOTAL HIP REPLACEMENT  Procedure Note ?ORAL HALLGREN   [id number] ? ?Pre-op Diagnosis: RIGHT HIP DEGENERATIVE JOINT DISEASE ?    ?Post-op Diagnosis: same ?  ?Operative Procedures  ?1. Total hip replacement; Right hip; uncemented [id number]  ? ?Surgeon: Frankey Shown, M.D. ? ?Assist: Madalyn Rob, PA-C ?  ?Anesthesia: spinal ? ?Prosthesis: Depuy ?Acetabulum: Pinnacle 52 mm ?Femur: Actis 6 STD ?Head: 36 mm size: +1.5 ?Liner: +4 ?Bearing Type: ceramic/poly ? ?Total Hip Arthroplasty (Anterior Approach) Op Note:  ?After informed consent was obtained and the operative extremity marked in the holding area, the patient was brought back to the operating room and placed supine on the HANA table. Next, the operative extremity was prepped and draped in normal sterile fashion. Surgical timeout occurred verifying patient identification, surgical site, surgical procedure and administration of antibiotics.  ?A modified anterior Smith-Peterson approach to the hip was performed, using the interval between tensor fascia lata and sartorius.  Dissection was carried bluntly down onto the anterior hip capsule. The lateral femoral circumflex vessels were identified and coagulated. A capsulotomy was performed and the capsular flaps tagged for later repair.  The neck osteotomy was performed. The femoral head was removed which showed severe wear, the acetabular rim was cleared of soft tissue and attention was turned to reaming the acetabulum.  ?Sequential reaming was performed under fluoroscopic guidance. We reamed to a size 51 mm, and then impacted the acetabular shell. A 25 mm cancellous screw was placed through the shell for added fixation.  The liner was then placed after irrigation and attention turned to the femur.  ?After placing the femoral hook, the leg was taken to externally rotated, extended and adducted position taking care to perform soft tissue releases to allow for adequate mobilization of the femur. Soft tissue  was cleared from the shoulder of the greater trochanter and the hook elevator used to improve exposure of the proximal femur. Sequential broaching performed up to a size 6. Trial neck and head were placed. The leg was brought back up to neutral and the construct reduced.  Antibiotic irrigation was placed in the surgical wound and kept for at least 1 minute.  The position and sizing of components, offset and leg lengths were checked using fluoroscopy. Stability of the construct was checked in extension and external rotation without any subluxation or impingement of prosthesis. We dislocated the prosthesis, dropped the leg back into position, removed trial components, and irrigated copiously. The final stem and head was then placed, the leg brought back up, the system reduced and fluoroscopy used to verify positioning.  ?We irrigated, obtained hemostasis and closed the capsule using #2 ethibond suture.  One gram of vancomycin powder was placed in the surgical bed.   One gram of topical tranexamic acid was injected into the joint.  The fascia was closed with #1 vicryl plus, the deep fat layer was closed with 0 vicryl, the subcutaneous layers closed with 2.0 Vicryl Plus and the skin closed with 2.0 nylon and dermabond. A sterile dressing was applied. The patient was awakened in the operating room and taken to recovery in stable condition.  ?All sponge, needle, and instrument counts were correct at the end of the case.  ? ?Tawanna Cooler, my PA, was a medical necessity for opening, closing, limb positioning, retracting, exposing, and overall facilitation and timely completion of the surgery. ? ?Position: supine  ?Complications: see description of procedure.  ?Time Out: performed  ? ?Drains/Packing: none ? ?Estimated blood loss: see  anesthesia record ? ?Returned to Recovery Room: in good condition.  ? ?Antibiotics: yes  ? ?Mechanical VTE (DVT) Prophylaxis: sequential compression devices, TED thigh-high  ?Chemical VTE  (DVT) Prophylaxis: aspirin  ? ?Fluid Replacement: see anesthesia record ? ?Specimens Removed: 1 to pathology  ? ?Sponge and Instrument Count Correct? yes  ? ?PACU: portable radiograph - low AP  ? ?Plan/RTC: Return in 2 weeks for staple removal. ?Weight Bearing/Load Lower Extremity: full  ?Hip precautions: none ?Suture Removal: 2 weeks  ? ?N. Eduard Roux, MD ?Marga Hoots ?2:43 PM ? ? ?Implant Name Type Inv. Item Serial No. Manufacturer Lot No. LRB No. Used Action  ?SCREW 6.5MMX25MM - [id number] Screw SCREW 6.5MMX25MM  [employer] [id number] Right 1 Implanted  ?LINER NEUTRAL 52X36MM PLUS 4 - [id number] Liner LINER NEUTRAL 52X36MM PLUS 4  [employer] [id number] Right 1 Implanted  ?PIN SECTOR W/GRIP ACE CUP 52MM - [id number] Hips PIN SECTOR W/GRIP ACE CUP 52MM  [employer] [id number] Right 1 Implanted  ?HEAD CERAMIC DELTA 36 PLUS 1.5 - [id number] Hips HEAD CERAMIC DELTA 36 PLUS 1.5  [employer] [id number] Right 1 Implanted  ?STEM FEMORAL SZ 6MM STD ACTIS - [id number] Stem STEM FEMORAL SZ 6MM STD ACTIS  [employer] [id number] Right 1 Implanted  ? ? ?

## 2021-07-24 ENCOUNTER — Encounter (HOSPITAL_COMMUNITY): Payer: Self-pay | Admitting: Orthopaedic Surgery

## 2021-07-24 DIAGNOSIS — Z7984 Long term (current) use of oral hypoglycemic drugs: Secondary | ICD-10-CM | POA: Diagnosis not present

## 2021-07-24 DIAGNOSIS — Z7982 Long term (current) use of aspirin: Secondary | ICD-10-CM | POA: Diagnosis not present

## 2021-07-24 DIAGNOSIS — Z96642 Presence of left artificial hip joint: Secondary | ICD-10-CM | POA: Diagnosis not present

## 2021-07-24 DIAGNOSIS — M1611 Unilateral primary osteoarthritis, right hip: Secondary | ICD-10-CM | POA: Diagnosis not present

## 2021-07-24 DIAGNOSIS — E1142 Type 2 diabetes mellitus with diabetic polyneuropathy: Secondary | ICD-10-CM | POA: Diagnosis not present

## 2021-07-24 DIAGNOSIS — I10 Essential (primary) hypertension: Secondary | ICD-10-CM | POA: Diagnosis not present

## 2021-07-24 DIAGNOSIS — Z79899 Other long term (current) drug therapy: Secondary | ICD-10-CM | POA: Diagnosis not present

## 2021-07-24 LAB — CBC
HCT: 36.9 % (ref 36.0–46.0)
Hemoglobin: 11.6 g/dL — ABNORMAL LOW (ref 12.0–15.0)
MCH: 26.5 pg (ref 26.0–34.0)
MCHC: 31.4 g/dL (ref 30.0–36.0)
MCV: 84.2 fL (ref 80.0–100.0)
Platelets: 220 10*3/uL (ref 150–400)
RBC: 4.38 MIL/uL (ref 3.87–5.11)
RDW: 14.7 % (ref 11.5–15.5)
WBC: 17.8 10*3/uL — ABNORMAL HIGH (ref 4.0–10.5)
nRBC: 0 % (ref 0.0–0.2)

## 2021-07-24 LAB — COMPREHENSIVE METABOLIC PANEL
ALT: 15 U/L (ref 0–44)
AST: 26 U/L (ref 15–41)
Albumin: 3.1 g/dL — ABNORMAL LOW (ref 3.5–5.0)
Alkaline Phosphatase: 40 U/L (ref 38–126)
Anion gap: 6 (ref 5–15)
BUN: 8 mg/dL (ref 8–23)
CO2: 30 mmol/L (ref 22–32)
Calcium: 8.6 mg/dL — ABNORMAL LOW (ref 8.9–10.3)
Chloride: 104 mmol/L (ref 98–111)
Creatinine, Ser: 0.83 mg/dL (ref 0.44–1.00)
GFR, Estimated: >60 mL/min (ref >60)
Glucose, Bld: 154 mg/dL — ABNORMAL HIGH (ref 70–99)
Potassium: 4 mmol/L (ref 3.5–5.1)
Sodium: 140 mmol/L (ref 135–145)
Total Bilirubin: 0.5 mg/dL (ref 0.3–1.2)
Total Protein: 6.3 g/dL — ABNORMAL LOW (ref 6.5–8.1)

## 2021-07-24 LAB — GLUCOSE, CAPILLARY
Glucose-Capillary: 182 mg/dL — ABNORMAL HIGH (ref 70–99)
Glucose-Capillary: 171 mg/dL — ABNORMAL HIGH (ref 70–99)
Glucose-Capillary: 157 mg/dL — ABNORMAL HIGH (ref 70–99)
Glucose-Capillary: 160 mg/dL — ABNORMAL HIGH (ref 70–99)

## 2021-07-24 NOTE — Anesthesia Postprocedure Evaluation (Signed)
Anesthesia Post Note ? ?Patient: Brittany [name] ? ?Procedure(s) Performed: RIGHT TOTAL HIP REPLACEMENT (Right: Hip) ? ?  ? ?Patient location during evaluation: PACU ?Anesthesia Type: Spinal ?Level of consciousness: awake and alert ?Pain management: pain level controlled ?Vital Signs Assessment: post-procedure vital signs reviewed and stable ?Respiratory status: spontaneous breathing and respiratory function stable ?Cardiovascular status: blood pressure returned to baseline and stable ?Postop Assessment: spinal receding and no apparent nausea or vomiting ?Anesthetic complications: no ? ? ?No notable events documented. ? ?Last Vitals:  ?Vitals:  ? 07/24/21 0337 07/24/21 0740  ?BP: (!) 163/60 (!) 122/57  ?Pulse:  76  ?Resp: 18 17  ?Temp: 37.6 ?C 37.7 ?C  ?SpO2:  95%  ?  ?Last Pain:  ?Vitals:  ? 07/24/21 0749  ?TempSrc:   ?PainSc: 0-No pain  ? ? ?  ?  ?  ?  ?  ?  ? ?Audry Pili ? ? ? ? ?

## 2021-07-24 NOTE — Progress Notes (Signed)
Subjective: ?1 Day Post-Op Procedure(s) (LRB): ?RIGHT TOTAL HIP REPLACEMENT (Right) ?Patient reports pain as mild.   ? ?Objective: ?Vital signs in last 24 hours: ?Temp:  [98 ?F (36.7 ?C)-99.9 ?F (37.7 ?C)] 99.9 ?F (37.7 ?C) (04/18 0740) ?Pulse Rate:  [62-76] 76 (04/18 0740) ?Resp:  [10-19] 17 (04/18 0740) ?BP: (118-163)/(56-102) 122/57 (04/18 0740) ?SpO2:  [95 %-100 %] 95 % (04/18 0740) ?Weight:  [86.2 kg] 86.2 kg (04/17 1116) ? ?Intake/Output from previous day: ?04/17 0701 - 04/18 0700 ?In: 2040 [P.O.:240; I.V.:1500; IV Piggyback:200] ?Out: 750 [Urine:700; Blood:50] ?Intake/Output this shift: ?No intake/output data recorded. ? ?No results for input(s): HGB in the last 72 hours. ?No results for input(s): WBC, RBC, HCT, PLT in the last 72 hours. ?No results for input(s): NA, K, CL, CO2, BUN, CREATININE, GLUCOSE, CALCIUM in the last 72 hours. ?No results for input(s): LABPT, INR in the last 72 hours. ? ?Neurologically intact ?Neurovascular intact ?Sensation intact distally ?Intact pulses distally ?Dorsiflexion/Plantar flexion intact ?Incision: dressing C/D/I ?No cellulitis present ?Compartment soft ? ? ?Assessment/Plan: ?1 Day Post-Op Procedure(s) (LRB): ?RIGHT TOTAL HIP REPLACEMENT (Right) ?Advance diet ?Up with therapy ?Discharge home with home health after second PT session ?WBAT RLE ?Awaiting labs ? ? ? ? ? ?Brittany [name] ?07/24/2021, 8:07 AM ? ?

## 2021-07-24 NOTE — Evaluation (Signed)
Physical Therapy Evaluation ?Patient Details ?Name: Brittany [name] ?MRN: [id number] ?DOB: [birth year] ?Today's Date: 07/24/2021 ? ?History of Present Illness ? 76 yo female was admitted 4/17 for R THA with direct anterior approach and WBAT post op.  Pt is home alone, has been walking on RW or SPC as needed prior to surgery.  PMHx:  OA R hip, DM, PN, depression, GERD, R transmet amp, hiatal hernia, HTN  ?Clinical Impression ? Pt was seen for first post op visit with notable difficulty with pain and weakness on R hip post surgery.  Pt is incontinent of urine, and per her report, will be expecting to go home alone with friends to drop by to help.  Requires significant help for all transfers, bed mob and taking steps.  Has a mildly impulsive quality initially but was feeling emergently that she needed to void.  Will anticipate pt to need assistance frequently to go home, with support for all bed mob, transfers and gait, as well as PT needed for recovery of independence.  Pt is recommended to have physician guidelines dictate follow up care, but will plan to see her BID until further recommendations are given from MD.  Pt has reported she lives alone, but recent chart note states she is living with family. ?   ? ?Recommendations for follow up therapy are one component of a multi-disciplinary discharge planning process, led by the attending physician.  Recommendations may be updated based on patient status, additional functional criteria and insurance authorization. ? ?Follow Up Recommendations Follow physician's recommendations for discharge plan and follow up therapies ? ?  ?Assistance Recommended at Discharge Frequent or constant Supervision/Assistance  ?Patient can return home with the following ? A lot of help with walking and/or transfers;A lot of help with bathing/dressing/bathroom;Assistance with cooking/housework;Assist for transportation;Help with stairs or ramp for entrance ? ?  ?Equipment Recommendations None  recommended by PT  ?Recommendations for Other Services ?    ?  ?Functional Status Assessment Patient has had a recent decline in their functional status and demonstrates the ability to make significant improvements in function in a reasonable and predictable amount of time.  ? ?  ?Precautions / Restrictions Precautions ?Precautions: Fall ?Restrictions ?Weight Bearing Restrictions: Yes ?RLE Weight Bearing: Weight bearing as tolerated  ? ?  ? ?Mobility ? Bed Mobility ?Overal bed mobility: Needs Assistance ?Bed Mobility: Supine to Sit ?  ?  ?Supine to sit: Mod assist ?  ?  ?General bed mobility comments: mod assist to support trunk and RLE with bed pad to scoot out on bed ?  ? ?Transfers ?Overall transfer level: Needs assistance ?Equipment used: Rolling walker (2 wheels), 1 person hand held assist ?Transfers: Sit to/from Stand ?Sit to Stand: Mod assist ?  ?  ?  ?  ?  ?General transfer comment: mod assist to power up and then min assist to sidestep to chair or BSC ?  ? ?Ambulation/Gait ?Ambulation/Gait assistance: Min assist ?Gait Distance (Feet): 5 Feet ?Assistive device: Rolling walker (2 wheels), 1 person hand held assist ?Gait Pattern/deviations: Step-to pattern, Decreased stride length, Wide base of support, Trunk flexed ?Gait velocity: reduced ?Gait velocity interpretation: <1.31 ft/sec, indicative of household ambulator ?Pre-gait activities: standing balance ck ?General Gait Details: sidesteps to chair from bed to sit up with min assist and awkward shuffling pattern ? ?Stairs ?  ?  ?  ?  ?  ? ?Wheelchair Mobility ?  ? ?Modified Rankin (Stroke Patients Only) ?  ? ?  ? ?Balance Overall  balance assessment: Needs assistance ?Sitting-balance support: Feet supported ?Sitting balance-Leahy Scale: Fair ?  ?Postural control: Left lateral lean ?Standing balance support: Bilateral upper extremity supported, During functional activity ?Standing balance-Leahy Scale: Poor ?Standing balance comment: poor balance due to  avoidance of R hip in standing, L lateral shift ?  ?  ?  ?  ?  ?  ?  ?  ?  ?  ?  ?   ? ? ? ?Pertinent Vitals/Pain Pain Assessment ?Pain Assessment: Faces ?Faces Pain Scale: Hurts little more ?Pain Location: R hip with movement ?Pain Descriptors / Indicators: Operative site guarding, Grimacing ?Pain Intervention(s): Limited activity within patient's tolerance, Monitored during session, Premedicated before session, Repositioned, Ice applied  ? ? ?Home Living Family/patient expects to be discharged to:: Private residence ?Living Arrangements: Alone ?Available Help at Discharge: Family ?Type of Home: House ?Home Access: Stairs to enter ?Entrance Stairs-Rails: None ?Entrance Stairs-Number of Steps: 1 ?  ?Home Layout: One level ?Home Equipment: Grab bars - tub/shower;Shower seat;Cane - single Barista (2 wheels) ?Additional Comments: pt has given some information that conflicts with previous information on chart  ?  ?Prior Function Prior Level of Function : Needs assist ?  ?  ?  ?Physical Assist : Mobility (physical) ?Mobility (physical): Gait ?  ?Mobility Comments: using a RW or SPC for self mobilization ?  ?  ? ? ?Hand Dominance  ? Dominant Hand: Right ? ?  ?Extremity/Trunk Assessment  ? Upper Extremity Assessment ?Upper Extremity Assessment: Overall WFL for tasks assessed ?  ? ?Lower Extremity Assessment ?Lower Extremity Assessment: RLE deficits/detail ?RLE Deficits / Details: generalized weakness and pain, requres help to move it to side of bed ?RLE: Unable to fully assess due to pain ?RLE Coordination: decreased gross motor ?  ? ?Cervical / Trunk Assessment ?Cervical / Trunk Assessment: Back Surgery (history of L4-5 surgery)  ?Communication  ? Communication: No difficulties  ?Cognition Arousal/Alertness: Awake/alert ?Behavior During Therapy: [facility] for tasks assessed/performed ?Overall Cognitive Status: No family/caregiver present to determine baseline cognitive functioning ?  ?  ?  ?  ?  ?  ?  ?  ?  ?  ?  ?   ?  ?  ?  ?  ?General Comments: pt is giving PT information on history, some of which conflicts with chart ?  ?  ? ?  ?General Comments General comments (skin integrity, edema, etc.): Pt is up to side of bed with urine incontinence, has difficulty controlling even after using BSC.  Had moment immediately after in chair in which pt reports she is voiding which was relayed to nursing ? ?  ?Exercises    ? ?Assessment/Plan  ?  ?PT Assessment Patient needs continued PT services  ?PT Problem List Decreased strength;Decreased range of motion;Decreased activity tolerance;Decreased balance;Decreased mobility;Decreased coordination;Decreased knowledge of use of DME;Decreased safety awareness;Decreased skin integrity;Pain ? ?   ?  ?PT Treatment Interventions DME instruction;Gait training;Functional mobility training;Therapeutic activities;Therapeutic exercise;Balance training;Neuromuscular re-education;Patient/family education;Stair training   ? ?PT Goals (Current goals can be found in the Care Plan section)  ?Acute Rehab PT Goals ?Patient Stated Goal: to go home with her friends to help her ?PT Goal Formulation: With patient ?Time For Goal Achievement: 08/07/21 ?Potential to Achieve Goals: Good ? ?  ?Frequency 7X/week ?  ? ? ?Co-evaluation   ?  ?  ?  ?  ? ? ?  ?AM-PAC PT "6 Clicks" Mobility  ?Outcome Measure Help needed turning from your back to your side while in a  flat bed without using bedrails?: A Lot ?Help needed moving from lying on your back to sitting on the side of a flat bed without using bedrails?: A Lot ?Help needed moving to and from a bed to a chair (including a wheelchair)?: A Lot ?Help needed standing up from a chair using your arms (e.g., wheelchair or bedside chair)?: A Lot ?Help needed to walk in hospital room?: A Little ?Help needed climbing 3-5 steps with a railing? : Total ?6 Click Score: 12 ? ?  ?End of Session Equipment Utilized During Treatment: Gait belt ?Activity Tolerance: Patient limited by  fatigue;Patient limited by pain;Treatment limited secondary to medical complications (Comment) ?Patient left: in chair;with call bell/phone within reach;with chair alarm set ?Nurse Communication: Mobility status;[place]

## 2021-07-24 NOTE — Discharge Summary (Addendum)
Patient ID: ?Brittany [name] ?MRN: [id number] ?DOB/AGE: 74-Jan-1947 74 y.o. ? ?Admit date: 07/23/2021 ?Discharge date: 07/25/2021 ? ?Admission Diagnoses:  ?Principal Problem: ?  Primary osteoarthritis of right hip ?Active Problems: ?  Degenerative joint disease of right hip ?  Status post total replacement of right hip ? ? ?Discharge Diagnoses:  ?Same ? ?Past Medical History:  ?Diagnosis Date  ? Anemia   ? as a younger woman  ? Anxiety   ? Arthritis   ? "all over my body" (10/01/2017)  ? Chronic pain   ? In pain clinic   ? Constipation   ? Depression   ? Diabetic peripheral neuropathy ([place])   ? GERD (gastroesophageal reflux disease)   ? High cholesterol   ? History of hiatal hernia   ? Hypertension   ? Type II diabetes mellitus ([place]) dx'd ~ 2008  ? ? ?Surgeries: Procedure(s): ?RIGHT TOTAL HIP REPLACEMENT on 07/23/2021 ?  ?Consultants:  ? ?Discharged Condition: Improved ? ?Hospital Course: Brittany [name] is an 76 y.o. female who was admitted 07/23/2021 for operative treatment ofPrimary osteoarthritis of right hip. Patient has severe unremitting pain that affects sleep, daily activities, and work/hobbies. After pre-op clearance the patient was taken to the operating room on 07/23/2021 and underwent  Procedure(s): ?RIGHT TOTAL HIP REPLACEMENT.   ? ?Patient was given perioperative antibiotics:  ?Anti-infectives (From admission, onward)  ? ? Start     Dose/Rate Route Frequency Ordered Stop  ? 07/23/21 1900  ceFAZolin (ANCEF) IVPB 2g/100 mL premix       ? 2 g ?200 mL/hr over 30 Minutes Intravenous Every 6 hours 07/23/21 1753 07/24/21 0814  ? 07/23/21 1443  vancomycin (VANCOCIN) powder  Status:  Discontinued       ?   As needed 07/23/21 1443 07/23/21 1511  ? 07/23/21 1300  ceFAZolin (ANCEF) IVPB 2g/100 mL premix       ? 2 g ?200 mL/hr over 30 Minutes Intravenous On call to O.R. 07/23/21 1111 07/23/21 1324  ? 07/23/21 1116  ceFAZolin (ANCEF) 2-4 GM/100ML-% IVPB       ?Note to Pharmacy: Wendall Mola M: cabinet override  ?     07/23/21 1116 07/23/21 1329  ? ?  ?  ? ?Patient was given sequential compression devices, early ambulation, and chemoprophylaxis to prevent DVT. ? ?Patient benefited maximally from hospital stay and there were no complications.   ? ?Recent vital signs: Patient Vitals for the past 24 hrs: ? BP Temp Temp src Pulse Resp SpO2  ?07/25/21 0842 (!) 123/57 -- -- -- -- --  ?07/24/21 2012 107/81 98.5 ?F (36.9 ?C) Oral 61 17 97 %  ?07/24/21 1438 (!) 127/50 98.3 ?F (36.8 ?C) Oral 60 18 98 %  ?  ? ?Recent laboratory studies:  ?Recent Labs  ?  07/24/21 ?1434  ?WBC 17.8*  ?HGB 11.6*  ?HCT 36.9  ?PLT 220  ?NA 140  ?K 4.0  ?CL 104  ?CO2 30  ?BUN 8  ?CREATININE 0.83  ?GLUCOSE 154*  ?CALCIUM 8.6*  ? ? ? ?Discharge Medications:   ?Allergies as of 07/25/2021   ? ?   Reactions  ? Sulfonamide Derivatives Hives  ? ?  ? ?  ?Medication List  ?  ? ?STOP taking these medications   ? ?tiZANidine 4 MG tablet ?Commonly known as: ZANAFLEX ?  ? ?  ? ?TAKE these medications   ? ?amLODipine 5 MG tablet ?Commonly known as: NORVASC ?Take 1 tablet (5 mg total) by mouth daily. ?  ?  aspirin EC 81 MG tablet ?Take 1 tablet (81 mg total) by mouth 2 (two) times daily. To be taken after surgery to prevent blood clots ?  ?atorvastatin 10 MG tablet ?Commonly known as: LIPITOR ?Take 1 tablet (10 mg total) by mouth daily. ?  ?BIOTIN PLUS KERATIN PO ?Take 1 capsule by mouth daily. ?  ?bisoprolol-hydrochlorothiazide 10-6.25 MG tablet ?Commonly known as: ZIAC ?Take 1 tablet by mouth daily. ?  ?calcium carbonate 500 MG chewable tablet ?Commonly known as: TUMS - dosed in mg elemental calcium ?Chew 2 tablets by mouth daily as needed for indigestion or heartburn. ?  ?cephALEXin 500 MG capsule ?Commonly known as: Keflex ?Take 1 capsule (500 mg total) by mouth 4 (four) times daily. To be taken after surgery ?  ?dapagliflozin propanediol 5 MG Tabs tablet ?Commonly known as: [place] ?Take 1 tablet (5 mg total) by mouth daily before breakfast. ?  ?docusate sodium 100 MG  capsule ?Commonly known as: Colace ?Take 1 capsule (100 mg total) by mouth daily as needed. ?  ?escitalopram 10 MG tablet ?Commonly known as: LEXAPRO ?Take 1 tablet (10 mg total) by mouth daily. ?  ?estradiol 1 MG tablet ?Commonly known as: ESTRACE ?Take 1 tablet (1 mg total) by mouth daily. ?  ?furosemide 20 MG tablet ?Commonly known as: LASIX ?Take 1 tablet (20 mg total) by mouth daily. ?What changed: when to take this ?  ?gabapentin 600 MG tablet ?Commonly known as: NEURONTIN ?Take 600 mg by mouth 4 (four) times daily. ?  ?glucose blood test strip ?Commonly known as: ONE TOUCH ULTRA TEST ?Check Blood sugar once daily ?  ?Janumet 50-1000 MG tablet ?Generic drug: sitaGLIPtin-metformin ?Take 2 tablets by mouth at bedtime. ?What changed:  ?when to take this ?additional instructions ?  ?Klor-Con M20 20 MEQ tablet ?Generic drug: potassium chloride SA ?TAKE 1 TABLET BY MOUTH ONCE DAILY ?  ?LUBRICATING EYE DROPS OP ?Place 1 drop into both eyes daily as needed (dry eyes). ?  ?methocarbamol 500 MG tablet ?Commonly known as: Robaxin ?Take 1 tablet (500 mg total) by mouth 2 (two) times daily as needed. To be taken after surgery ?  ?morphine 30 MG 12 hr tablet ?Commonly known as: MS CONTIN ?Take 30 mg by mouth every 12 (twelve) hours. Per Dr. Nicholaus Bloom with Pain Mgmt ?  ?multivitamin capsule ?Take 1 capsule by mouth daily. ?  ?omeprazole 40 MG capsule ?Commonly known as: PRILOSEC ?TAKE 1 CAPSULE BY MOUTH DAILY ?  ?ondansetron 4 MG tablet ?Commonly known as: Zofran ?Take 1 tablet (4 mg total) by mouth every 8 (eight) hours as needed for nausea or vomiting. ?  ?ONE TOUCH ULTRA MINI w/Device Kit ?Use as directed once a day.  Dx Code:  E11.42 ?  ?OneTouch Delica Lancets 02V Misc ?Check blood sugar once daily ?  ?oxyCODONE-acetaminophen 5-325 MG tablet ?Commonly known as: Percocet ?Take 1 tablet by mouth every 6 (six) hours as needed. To be taken after surgery ?  ?VITAMIN D PO ?Take 1 capsule by mouth daily. ?  ? ?  ? ?  ?   ? ? ?  ?Durable Medical Equipment  ?(From admission, onward)  ?  ? ? ?  ? ?  Start     Ordered  ? 07/23/21 1754  DME Walker rolling  Once       ?Question:  Patient needs a walker to treat with the following condition  Answer:  History of hip replacement  ? 07/23/21 1753  ? 07/23/21 1754  DME  3 n 1  Once       ? 07/23/21 1753  ? 07/23/21 1754  DME Bedside commode  Once       ?Question:  Patient needs a bedside commode to treat with the following condition  Answer:  History of hip replacement  ? 07/23/21 1753  ? ?  ?  ? ?  ? ? ?Diagnostic Studies: DG Pelvis Portable ? ?Result Date: 07/23/2021 ?CLINICAL DATA:  Right hip arthroplasty EXAM: PORTABLE PELVIS 1-2 VIEWS COMPARISON:  11/16/2018 FINDINGS: Interval postsurgical changes from right total hip arthroplasty. Arthroplasty components are in their expected alignment. No periprosthetic fracture or evidence of other complication. Expected postoperative changes within the overlying soft tissues. IMPRESSION: Interval postsurgical changes from right total hip arthroplasty. Electronically Signed   By: Davina Poke D.O.   On: 07/23/2021 15:39  ? ?DG C-Arm 1-60 Min-No Report ? ?Result Date: 07/23/2021 ?CLINICAL DATA:  Right total hip arthroplasty EXAM: DG HIP (WITH OR WITHOUT PELVIS) 1V RIGHT; DG C-ARM 1-60 MIN-NO REPORT COMPARISON:  05/14/2021 right hip radiographs FLUOROSCOPY TIME:  Radiation Exposure Index (if provided by the fluoroscopic device): 6.8 mGy FINDINGS: Nondiagnostic spot fluoroscopic intraoperative right hip radiographs demonstrate expected postsurgical changes from right total hip arthroplasty. Previous left total hip arthroplasty. No evidence of hip malalignment on these views. IMPRESSION: Intraoperative fluoroscopic guidance for right total hip arthroplasty. Electronically Signed   By: Ilona Sorrel M.D.   On: 07/23/2021 15:10  ? ?DG C-Arm 1-60 Min-No Report ? ?Result Date: 07/23/2021 ?CLINICAL DATA:  Right total hip arthroplasty EXAM: DG HIP (WITH OR  WITHOUT PELVIS) 1V RIGHT; DG C-ARM 1-60 MIN-NO REPORT COMPARISON:  05/14/2021 right hip radiographs FLUOROSCOPY TIME:  Radiation Exposure Index (if provided by the fluoroscopic device): 6.8 mGy FINDINGS: Nondiagnostic spo

## 2021-07-24 NOTE — Progress Notes (Signed)
Physical Therapy Treatment ?Patient Details ?Name: Brittany [name] ?MRN: [id number] ?DOB: 07/10/1945 ?Today's Date: 07/24/2021 ? ? ?History of Present Illness 76 yo female was admitted 4/17 for R THA with direct anterior approach and WBAT post op.  Pt is home alone, has been walking on RW or SPC as needed prior to surgery.  PMHx:  OA R hip, DM, PN, depression, GERD, R transmet amp, hiatal hernia, HTN ? ?  ?PT Comments  ? ? Pt was seen for gait on RW in her room and to manage use of BSC.  Pt is still working on control of continence, and with a garment to protect her, could walk in the room with no worry about leaking.  Pt is more controlled with standing balance, more aware of how to maneuver on the walker and self correcting her upright posture.  Follow along to prepare for home with family friend, and will work on stairs if her tolerance for gait allows tomorrow.  ?Recommendations for follow up therapy are one component of a multi-disciplinary discharge planning process, led by the attending physician.  Recommendations may be updated based on patient status, additional functional criteria and insurance authorization. ? ?Follow Up Recommendations ? Follow physician's recommendations for discharge plan and follow up therapies ?  ?  ?Assistance Recommended at Discharge Frequent or constant Supervision/Assistance  ?Patient can return home with the following A lot of help with walking and/or transfers;A lot of help with bathing/dressing/bathroom;Assistance with cooking/housework;Assist for transportation;Help with stairs or ramp for entrance ?  ?Equipment Recommendations ? None recommended by PT  ?  ?Recommendations for Other Services   ? ? ?  ?Precautions / Restrictions Precautions ?Precautions: Fall ?Restrictions ?Weight Bearing Restrictions: Yes ?RLE Weight Bearing: Weight bearing as tolerated  ?  ? ?Mobility ? Bed Mobility ?Overal bed mobility: Needs Assistance ?Bed Mobility: Sit to Supine ?  ?  ?Supine to sit: Mod  assist ?Sit to supine: Min assist, Mod assist ?  ?General bed mobility comments: min assist for trunk and mod for minor help to legs ?  ? ?Transfers ?Overall transfer level: Needs assistance ?Equipment used: Rolling walker (2 wheels), 1 person hand held assist ?Transfers: Sit to/from Stand ?Sit to Stand: Mod assist ?  ?  ?  ?  ?  ?General transfer comment: mod assist to power up and then min assist to sidestep to chair or BSC ?  ? ?Ambulation/Gait ?Ambulation/Gait assistance: Min assist ?Gait Distance (Feet): 25 Feet ?Assistive device: Rolling walker (2 wheels), 1 person hand held assist ?Gait Pattern/deviations: Decreased stride length, Wide base of support, Trunk flexed, Step-through pattern ?Gait velocity: reduced ?Gait velocity interpretation: <1.31 ft/sec, indicative of household ambulator ?Pre-gait activities: adjusting clothes to manage urine incontinence ?General Gait Details: steps on RW with slow pace and cues for direction to avoid twisting IV ? ? ?Stairs ?  ?  ?  ?  ?  ? ? ?Wheelchair Mobility ?  ? ?Modified Rankin (Stroke Patients Only) ?  ? ? ?  ?Balance Overall balance assessment: Needs assistance ?Sitting-balance support: Feet supported ?Sitting balance-Leahy Scale: Fair ?  ?Postural control: Left lateral lean ?Standing balance support: Bilateral upper extremity supported, During functional activity ?Standing balance-Leahy Scale: Poor ?Standing balance comment: poor balance due to avoidance of R hip in standing, L lateral shift ?  ?  ?  ?  ?  ?  ?  ?  ?  ?  ?  ?  ? ?  ?Cognition Arousal/Alertness: Awake/alert ?Behavior During Therapy: [facility] for  tasks assessed/performed ?Overall Cognitive Status: No family/caregiver present to determine baseline cognitive functioning ?  ?  ?  ?  ?  ?  ?  ?  ?  ?  ?  ?  ?  ?  ?  ?  ?General Comments: pt is giving PT information on history, some of which conflicts with chart ?  ?  ? ?  ?Exercises   ? ?  ?General Comments General comments (skin integrity, edema, etc.): pt  returned to bed after using [facility], talked with her about asking to sit on commode more often to control incontinence ?  ?  ? ?Pertinent Vitals/Pain Pain Assessment ?Pain Assessment: Faces ?Faces Pain Scale: Hurts little more ?Pain Location: R hip with movement ?Pain Descriptors / Indicators: Operative site guarding, Grimacing ?Pain Intervention(s): Limited activity within patient's tolerance, Monitored during session, Premedicated before session, Repositioned, Ice applied  ? ? ?Home Living   ?  ?  ?  ?  ?  ?  ?  ?  ?  ?   ?  ?Prior Function    ?  ?  ?   ? ?PT Goals (current goals can now be found in the care plan section) Acute Rehab PT Goals ?Patient Stated Goal: to go home with her friends to help her ?PT Goal Formulation: With patient ?Time For Goal Achievement: 08/07/21 ?Potential to Achieve Goals: Good ? ?  ?Frequency ? ? ? 7X/week ? ? ? ?  ?PT Plan    ? ? ?Co-evaluation   ?  ?  ?  ?  ? ?  ?AM-PAC PT "6 Clicks" Mobility   ?Outcome Measure ? Help needed turning from your back to your side while in a flat bed without using bedrails?: A Lot ?Help needed moving from lying on your back to sitting on the side of a flat bed without using bedrails?: A Lot ?Help needed moving to and from a bed to a chair (including a wheelchair)?: A Lot ?Help needed standing up from a chair using your arms (e.g., wheelchair or bedside chair)?: A Lot ?Help needed to walk in hospital room?: A Little ?Help needed climbing 3-5 steps with a railing? : Total ?6 Click Score: 12 ? ?  ?End of Session Equipment Utilized During Treatment: Gait belt ?Activity Tolerance: Patient limited by fatigue;Patient limited by pain;Treatment limited secondary to medical complications (Comment) ?Patient left: in chair;with call bell/phone within reach;with chair alarm set ?Nurse Communication: Mobility status;Other (comment) (reported incontinence) ?PT Visit Diagnosis: Unsteadiness on feet (R26.81);Muscle weakness (generalized) (M62.81);Pain ?Pain - Right/Left:  Right ?Pain - part of body: Hip ?  ? ? ?Time: 5364-6803 ?PT Time Calculation (min) (ACUTE ONLY): 31 min ? ?Charges:  $Gait Training: 8-22 mins ?$Therapeutic Activity: 8-22 mins     ?Ramond Dial ?07/24/2021, 5:03 PM ? ?Mee Hives, PT PhD ?Acute Rehab Dept. Number: [facility] [phone] and [place] [phone] ? ? ?

## 2021-07-24 NOTE — Progress Notes (Addendum)
Per patient's RN said therapy just completed evaluating the patient and recommends rehab. No note has been documented. She isn't discharge lounge appropriate due to mobility issues.  ? ?Brittany [name] SWOT-RN ?

## 2021-07-24 NOTE — TOC Transition Note (Addendum)
Transition of Care (TOC) - CM/SW Discharge Note ? ? ?Patient Details  ?Name: Brittany [name] ?MRN: [id number] ?Date of Birth: 1946/03/29 ? ?Transition of Care [facility]) CM/SW Contact:  ?Sharin Mons, RN ?Phone Number: ?07/24/2021, 8:26 AM ? ? ?Clinical Narrative:    ?Patient will DC OF:[facility] ?Anticipated DC date: 07/24/2021 ?Family notified: yes ?Transport by: car ? ?- S/P R TOTAL HIP REPLACEMENT, 4/18 ? ?Per MD patient ready for DC today. RN, patient, patient's frIend, and [place] notified of DC. Pt states  Hassan Rowan (friend) will assist and help with needs once d/c. ?Pt without DME needs. States already has @ home RW and BSC. ?Post hospital follow noted on AVS.  ? ?Pt's friend to provide transportation home.  ?Justice Rocher (Friend)     ?[phone]    ? ?07/24/2021 1410 -  D/C placed on hold after  NCM shared with Mendel Ryder PA , PT 's  concern: She is weak, mod assist to stand and takes only a few steps.  Has urine incontinence. Lindsey to f/u with pt. ? ? ?RNCM will sign off for now as intervention is no longer needed. Please consult [place] again if new needs arise.  ? ? ?Final next level of care: [place] ?Barriers to Discharge: No Barriers Identified ? ? ?Patient Goals and CMS Choice ?  ?  ?  ? ?Discharge Placement ?  ?           ?  ?  ?  ?  ? ?Discharge Plan and Services ?  ?  ?           ?  ?  ?  ?  ?  ?HH Arranged: PT ?[place] Agency: [place] ?Date HH Agency Contacted: 07/24/21 ?Time [place]: [phone] ?Representative spoke with at [place]: arranged by MD's office prior to service ? ?Social Determinants of Health (SDOH) Interventions ?  ? ? ?Readmission Risk Interventions ?   ? View : No data to display.  ?  ?  ?  ? ? ? ? ? ?

## 2021-07-25 DIAGNOSIS — M1611 Unilateral primary osteoarthritis, right hip: Secondary | ICD-10-CM | POA: Diagnosis not present

## 2021-07-25 DIAGNOSIS — Z7982 Long term (current) use of aspirin: Secondary | ICD-10-CM | POA: Diagnosis not present

## 2021-07-25 DIAGNOSIS — Z79899 Other long term (current) drug therapy: Secondary | ICD-10-CM | POA: Diagnosis not present

## 2021-07-25 DIAGNOSIS — Z96642 Presence of left artificial hip joint: Secondary | ICD-10-CM | POA: Diagnosis not present

## 2021-07-25 DIAGNOSIS — E1142 Type 2 diabetes mellitus with diabetic polyneuropathy: Secondary | ICD-10-CM | POA: Diagnosis not present

## 2021-07-25 DIAGNOSIS — I10 Essential (primary) hypertension: Secondary | ICD-10-CM | POA: Diagnosis not present

## 2021-07-25 DIAGNOSIS — Z7984 Long term (current) use of oral hypoglycemic drugs: Secondary | ICD-10-CM | POA: Diagnosis not present

## 2021-07-25 LAB — GLUCOSE, CAPILLARY
Glucose-Capillary: 153 mg/dL — ABNORMAL HIGH (ref 70–99)
Glucose-Capillary: 189 mg/dL — ABNORMAL HIGH (ref 70–99)

## 2021-07-25 NOTE — Progress Notes (Signed)
Subjective: ?2 Days Post-Op Procedure(s) (LRB): ?RIGHT TOTAL HIP REPLACEMENT (Right) ?Patient reports pain as mild.  Feeling much better today and asking when she can go home ? ?Objective: ?Vital signs in last 24 hours: ?Temp:  [98.3 ?F (36.8 ?C)-98.5 ?F (36.9 ?C)] 98.5 ?F (36.9 ?C) (04/18 2012) ?Pulse Rate:  [60-61] 61 (04/18 2012) ?Resp:  [17-18] 17 (04/18 2012) ?BP: (107-127)/(50-81) 123/57 (04/19 1610) ?SpO2:  [97 %-98 %] 97 % (04/18 2012) ? ?Intake/Output from previous day: ?04/18 0701 - 04/19 0700 ?In: 2426.2 [P.O.:840; I.V.:1586.2] ?Out: -  ?Intake/Output this shift: ?No intake/output data recorded. ? ?Recent Labs  ?  07/24/21 ?1434  ?HGB 11.6*  ? ?Recent Labs  ?  07/24/21 ?1434  ?WBC 17.8*  ?RBC 4.38  ?HCT 36.9  ?PLT 220  ? ?Recent Labs  ?  07/24/21 ?1434  ?NA 140  ?K 4.0  ?CL 104  ?CO2 30  ?BUN 8  ?CREATININE 0.83  ?GLUCOSE 154*  ?CALCIUM 8.6*  ? ?No results for input(s): LABPT, INR in the last 72 hours. ? ?Neurologically intact ?Neurovascular intact ?Sensation intact distally ?Intact pulses distally ?Dorsiflexion/Plantar flexion intact ?Incision: dressing C/D/I ?No cellulitis present ?Compartment soft ? ? ?Assessment/Plan: ?2 Days Post-Op Procedure(s) (LRB): ?RIGHT TOTAL HIP REPLACEMENT (Right) ?Advance diet ?Up with therapy ?D/C IV fluids ?Discharge home with home health ?WBAT RLE ?ABLA- mild and stable ?May d/c today once cleared by PT (I do not see a note from this am PT session).   ? ? ? ? ? ?Aundra Dubin ?07/25/2021, 11:55 AM ? ?

## 2021-07-25 NOTE — Progress Notes (Signed)
Physical Therapy Treatment ?Patient Details ?Name: Brittany [name] ?MRN: [id number] ?DOB: 01-Apr-1946 ?Today's Date: 07/25/2021 ? ? ?History of Present Illness 76 yo female was admitted 4/17 for R THA with direct anterior approach and WBAT post op.  Pt is home alone, has been walking on RW or SPC as needed prior to surgery.  PMHx:  OA R hip, DM, PN, depression, GERD, R transmet amp, hiatal hernia, HTN ? ?  ?PT Comments  ? ? Received pt semi-reclined in bed and agreeable to PT treatment but requesting to remain in room. Encouraged practicing stairs, however pt politely declined and reported confidence with navigating 1 small step for home entry. Pt required min A for bed mobility for management of RLE. Pt performed transfers with RW and min A and progressed with ambulation distance today with min A overall (including multiple turns). Pt reported urge to have BM and left sitting on commode (per pt request) with RN aware of pt's current status and on her way to attend to pt care. Acute PT to cont to follow.  ?  ?Recommendations for follow up therapy are one component of a multi-disciplinary discharge planning process, led by the attending physician.  Recommendations may be updated based on patient status, additional functional criteria and insurance authorization. ? ?Follow Up Recommendations ? Follow physician's recommendations for discharge plan and follow up therapies ?  ?  ?Assistance Recommended at Discharge Frequent or constant Supervision/Assistance  ?Patient can return home with the following Assistance with cooking/housework;Assist for transportation;Help with stairs or ramp for entrance;A little help with walking and/or transfers;A little help with bathing/dressing/bathroom ?  ?Equipment Recommendations ? None recommended by PT  ?  ?Recommendations for Other Services   ? ? ?  ?Precautions / Restrictions Precautions ?Precautions: Fall ?Restrictions ?Weight Bearing Restrictions: Yes ?RLE Weight Bearing: Weight  bearing as tolerated  ?  ? ?Mobility ? Bed Mobility ?Overal bed mobility: Needs Assistance ?Bed Mobility: Supine to Sit ?  ?  ?Supine to sit: Min assist, HOB elevated ?  ?  ?General bed mobility comments: assist to manage RLE ?Patient Response: Cooperative ? ?Transfers ?Overall transfer level: Needs assistance ?Equipment used: Rolling walker (2 wheels) ?Transfers: Sit to/from Stand ?Sit to Stand: Min assist, From elevated surface ?  ?  ?  ?  ?  ?General transfer comment: cues for proper hand placement on RW ?  ? ?Ambulation/Gait ?Ambulation/Gait assistance: Min assist ?Gait Distance (Feet): 50 Feet ?Assistive device: Rolling walker (2 wheels) ?Gait Pattern/deviations: Decreased stride length, Trunk flexed, Step-to pattern, Antalgic ?Gait velocity: reduced ?Gait velocity interpretation: <1.31 ft/sec, indicative of household ambulator ?  ?General Gait Details: cues for turning to avoid tangling IV ? ? ?Stairs ?  ?  ?  ?  ?  ? ? ?Wheelchair Mobility ?  ? ?Modified Rankin (Stroke Patients Only) ?  ? ? ?  ?Balance Overall balance assessment: Needs assistance ?Sitting-balance support: Feet supported, Bilateral upper extremity supported ?Sitting balance-Leahy Scale: Fair ?  ?  ?Standing balance support: Bilateral upper extremity supported, During functional activity, Reliant on assistive device for balance (RW) ?Standing balance-Leahy Scale: Poor ?Standing balance comment: required close supervision for static standing balance and min A for dynamic standing balance ?  ?  ?  ?  ?  ?  ?  ?  ?  ?  ?  ?  ? ?  ?Cognition Arousal/Alertness: Awake/alert ?Behavior During Therapy: [facility] for tasks assessed/performed ?Overall Cognitive Status: No family/caregiver present to determine baseline cognitive functioning ?  ?  ?  ?  ?  ?  ?  ?  ?  ?  ?  ?  ?  ?  ?  ?  ?  ?  ?  ? ?  ?  Exercises   ? ?  ?General Comments General comments (skin integrity, edema, etc.): pt very concerned with incontinence ?  ?  ? ?Pertinent Vitals/Pain Pain  Assessment ?Pain Assessment: 0-10 ?Pain Score: 5  ?Pain Location: R hip with movement ?Pain Descriptors / Indicators: Operative site guarding, Grimacing, Discomfort, Moaning ?Pain Intervention(s): Limited activity within patient's tolerance, Monitored during session, Premedicated before session, Repositioned  ? ? ?Home Living   ?  ?  ?  ?  ?  ?  ?  ?  ?  ?   ?  ?Prior Function    ?  ?  ?   ? ?PT Goals (current goals can now be found in the care plan section) Acute Rehab PT Goals ?Patient Stated Goal: to go home with her friends to help her ?PT Goal Formulation: With patient ?Time For Goal Achievement: 08/07/21 ?Potential to Achieve Goals: Good ?Progress towards PT goals: Progressing toward goals ? ?  ?Frequency ? ? ? 7X/week ? ? ? ?  ?PT Plan Current plan remains appropriate  ? ? ?Co-evaluation   ?  ?  ?  ?  ? ?  ?AM-PAC PT "6 Clicks" Mobility   ?Outcome Measure ? Help needed turning from your back to your side while in a flat bed without using bedrails?: A Little ?Help needed moving from lying on your back to sitting on the side of a flat bed without using bedrails?: A Little ?Help needed moving to and from a bed to a chair (including a wheelchair)?: A Little ?Help needed standing up from a chair using your arms (e.g., wheelchair or bedside chair)?: A Little ?Help needed to walk in hospital room?: A Little ?Help needed climbing 3-5 steps with a railing? : A Lot ?6 Click Score: 17 ? ?  ?End of Session Equipment Utilized During Treatment: Gait belt ?Activity Tolerance: Patient limited by pain;Patient tolerated treatment well ?Patient left: Other (comment) (on commode with RN notified and planning on attending to pt care) ?Nurse Communication: Mobility status ?PT Visit Diagnosis: Unsteadiness on feet (R26.81);Muscle weakness (generalized) (M62.81);Pain ?Pain - Right/Left: Right ?Pain - part of body: Hip ?  ? ? ?Time: 8144-8185 ?PT Time Calculation (min) (ACUTE ONLY): 22 min ? ?Charges:  $Gait Training: 8-22 mins           ?          ? ?Becky Sax PT, DPT  ?Blenda Nicely ?07/25/2021, 12:07 PM ? ?

## 2021-07-25 NOTE — Plan of Care (Signed)
?  Problem: Education: ?Goal: Knowledge of General Education information will improve ?Description: Including pain rating scale, medication(s)/side effects and non-pharmacologic comfort measures ?Outcome: Adequate for Discharge ?  ?Problem: Health Behavior/Discharge Planning: ?Goal: Ability to manage health-related needs will improve ?Outcome: Adequate for Discharge ?  ?Problem: Clinical Measurements: ?Goal: Ability to maintain clinical measurements within normal limits will improve ?Outcome: Adequate for Discharge ?Goal: Will remain free from infection ?Outcome: Adequate for Discharge ?Goal: Diagnostic test results will improve ?Outcome: Adequate for Discharge ?Goal: Respiratory complications will improve ?Outcome: Adequate for Discharge ?Goal: Cardiovascular complication will be avoided ?Outcome: Adequate for Discharge ?  ?Problem: Acute Rehab PT Goals(only PT should resolve) ?Goal: Patient Will Transfer Sit To/From Stand ?Outcome: Adequate for Discharge ?Goal: Pt Will Transfer Bed To Chair/Chair To Bed ?Outcome: Adequate for Discharge ?Goal: Pt Will Perform Standing Balance Or Pre-Gait ?Outcome: Adequate for Discharge ?Goal: Pt Will Ambulate ?Outcome: Adequate for Discharge ?Goal: Pt Will Go Up/Down Stairs ?Outcome: Adequate for Discharge ?  ?Problem: Acute Rehab PT Goals(only PT should resolve) ?Goal: Pt Will Go Supine/Side To Sit ?Outcome: Adequate for Discharge ?  ?

## 2021-07-27 DIAGNOSIS — F32A Depression, unspecified: Secondary | ICD-10-CM | POA: Diagnosis not present

## 2021-07-27 DIAGNOSIS — K219 Gastro-esophageal reflux disease without esophagitis: Secondary | ICD-10-CM | POA: Diagnosis not present

## 2021-07-27 DIAGNOSIS — D649 Anemia, unspecified: Secondary | ICD-10-CM | POA: Diagnosis not present

## 2021-07-27 DIAGNOSIS — E7800 Pure hypercholesterolemia, unspecified: Secondary | ICD-10-CM | POA: Diagnosis not present

## 2021-07-27 DIAGNOSIS — N8110 Cystocele, unspecified: Secondary | ICD-10-CM | POA: Diagnosis not present

## 2021-07-27 DIAGNOSIS — Z9181 History of falling: Secondary | ICD-10-CM | POA: Diagnosis not present

## 2021-07-27 DIAGNOSIS — E1142 Type 2 diabetes mellitus with diabetic polyneuropathy: Secondary | ICD-10-CM | POA: Diagnosis not present

## 2021-07-27 DIAGNOSIS — Z471 Aftercare following joint replacement surgery: Secondary | ICD-10-CM | POA: Diagnosis not present

## 2021-07-27 DIAGNOSIS — Z96643 Presence of artificial hip joint, bilateral: Secondary | ICD-10-CM | POA: Diagnosis not present

## 2021-07-27 DIAGNOSIS — G8929 Other chronic pain: Secondary | ICD-10-CM | POA: Diagnosis not present

## 2021-07-27 DIAGNOSIS — Z7982 Long term (current) use of aspirin: Secondary | ICD-10-CM | POA: Diagnosis not present

## 2021-07-27 DIAGNOSIS — K5900 Constipation, unspecified: Secondary | ICD-10-CM | POA: Diagnosis not present

## 2021-07-30 DIAGNOSIS — G8929 Other chronic pain: Secondary | ICD-10-CM | POA: Diagnosis not present

## 2021-07-30 DIAGNOSIS — Z7982 Long term (current) use of aspirin: Secondary | ICD-10-CM | POA: Diagnosis not present

## 2021-07-30 DIAGNOSIS — K219 Gastro-esophageal reflux disease without esophagitis: Secondary | ICD-10-CM | POA: Diagnosis not present

## 2021-07-30 DIAGNOSIS — Z96643 Presence of artificial hip joint, bilateral: Secondary | ICD-10-CM | POA: Diagnosis not present

## 2021-07-30 DIAGNOSIS — Z9181 History of falling: Secondary | ICD-10-CM | POA: Diagnosis not present

## 2021-07-30 DIAGNOSIS — E7800 Pure hypercholesterolemia, unspecified: Secondary | ICD-10-CM | POA: Diagnosis not present

## 2021-07-30 DIAGNOSIS — Z471 Aftercare following joint replacement surgery: Secondary | ICD-10-CM | POA: Diagnosis not present

## 2021-07-30 DIAGNOSIS — E1142 Type 2 diabetes mellitus with diabetic polyneuropathy: Secondary | ICD-10-CM | POA: Diagnosis not present

## 2021-07-30 DIAGNOSIS — F32A Depression, unspecified: Secondary | ICD-10-CM | POA: Diagnosis not present

## 2021-07-30 DIAGNOSIS — K5900 Constipation, unspecified: Secondary | ICD-10-CM | POA: Diagnosis not present

## 2021-07-30 DIAGNOSIS — N8110 Cystocele, unspecified: Secondary | ICD-10-CM | POA: Diagnosis not present

## 2021-07-30 DIAGNOSIS — D649 Anemia, unspecified: Secondary | ICD-10-CM | POA: Diagnosis not present

## 2021-08-03 ENCOUNTER — Telehealth: Payer: Self-pay | Admitting: *Deleted

## 2021-08-03 DIAGNOSIS — G8929 Other chronic pain: Secondary | ICD-10-CM | POA: Diagnosis not present

## 2021-08-03 DIAGNOSIS — F32A Depression, unspecified: Secondary | ICD-10-CM | POA: Diagnosis not present

## 2021-08-03 DIAGNOSIS — D649 Anemia, unspecified: Secondary | ICD-10-CM | POA: Diagnosis not present

## 2021-08-03 DIAGNOSIS — Z7982 Long term (current) use of aspirin: Secondary | ICD-10-CM | POA: Diagnosis not present

## 2021-08-03 DIAGNOSIS — Z471 Aftercare following joint replacement surgery: Secondary | ICD-10-CM | POA: Diagnosis not present

## 2021-08-03 DIAGNOSIS — K219 Gastro-esophageal reflux disease without esophagitis: Secondary | ICD-10-CM | POA: Diagnosis not present

## 2021-08-03 DIAGNOSIS — N8110 Cystocele, unspecified: Secondary | ICD-10-CM | POA: Diagnosis not present

## 2021-08-03 DIAGNOSIS — E1142 Type 2 diabetes mellitus with diabetic polyneuropathy: Secondary | ICD-10-CM | POA: Diagnosis not present

## 2021-08-03 DIAGNOSIS — Z9181 History of falling: Secondary | ICD-10-CM | POA: Diagnosis not present

## 2021-08-03 DIAGNOSIS — E7800 Pure hypercholesterolemia, unspecified: Secondary | ICD-10-CM | POA: Diagnosis not present

## 2021-08-03 DIAGNOSIS — K5900 Constipation, unspecified: Secondary | ICD-10-CM | POA: Diagnosis not present

## 2021-08-03 DIAGNOSIS — Z96643 Presence of artificial hip joint, bilateral: Secondary | ICD-10-CM | POA: Diagnosis not present

## 2021-08-03 NOTE — Telephone Encounter (Signed)
Ortho bundle 7 day call completed; patient reports doing well overall. Will meet in office next week for 2 week visit. ?

## 2021-08-04 ENCOUNTER — Other Ambulatory Visit: Payer: Self-pay | Admitting: Family Medicine

## 2021-08-04 DIAGNOSIS — Z780 Asymptomatic menopausal state: Secondary | ICD-10-CM

## 2021-08-04 DIAGNOSIS — E1110 Type 2 diabetes mellitus with ketoacidosis without coma: Secondary | ICD-10-CM

## 2021-08-04 DIAGNOSIS — I10 Essential (primary) hypertension: Secondary | ICD-10-CM

## 2021-08-04 DIAGNOSIS — R609 Edema, unspecified: Secondary | ICD-10-CM

## 2021-08-04 DIAGNOSIS — F411 Generalized anxiety disorder: Secondary | ICD-10-CM

## 2021-08-04 DIAGNOSIS — E1169 Type 2 diabetes mellitus with other specified complication: Secondary | ICD-10-CM

## 2021-08-07 ENCOUNTER — Ambulatory Visit (INDEPENDENT_AMBULATORY_CARE_PROVIDER_SITE_OTHER): Payer: Medicare Other | Admitting: Orthopaedic Surgery

## 2021-08-07 ENCOUNTER — Ambulatory Visit (INDEPENDENT_AMBULATORY_CARE_PROVIDER_SITE_OTHER): Payer: Medicare Other

## 2021-08-07 DIAGNOSIS — Z96641 Presence of right artificial hip joint: Secondary | ICD-10-CM

## 2021-08-07 MED ORDER — OXYCODONE-ACETAMINOPHEN 5-325 MG PO TABS: 1.0000 | ORAL_TABLET | Freq: Every day | ORAL | 0 refills | Status: DC | PRN

## 2021-08-07 NOTE — Progress Notes (Signed)
? ?Post-Op Visit Note ?  ?Patient: Brittany [name]           ?Date of Birth: Jan 14, 1946           ?MRN: [id number] ?Visit Date: 08/07/2021 ?PCP: Ann Held, DO ? ? ?Assessment & Plan: ? ?Chief Complaint:  ?Chief Complaint  ?Patient presents with  ? Right Hip - Routine Post Op, Pain  ? ?Visit Diagnoses:  ?1. Status post total replacement of right hip   ? ? ?Plan: Ms. Cargle is a 2 weeks status post right total hip replacement.  She getting physical therapy at home 2-3 times a week.  Reports some numbness around the incision.  Ambulating with a walker.  Requesting refill oxycodone.  She did fall into her walker 1 day but has been able to weight-bear and did not actually fall to the ground. ? ?Examination of right hip shows a healed surgical incision.  No evidence of infection.  No drainage.  Gentle range of motion of the hip is well-tolerated.  Neurovascularly intact distally. ? ?We remove the sutures today.  Provide handicap placard for the car.  Percocet refilled.  Continue aspirin for DVT prophylaxis.  Dental prophylaxis reinforced.  Implant card provided.  Recheck in 4 weeks with standing AP pelvis x-rays. ? ?Follow-Up Instructions: Return in about 4 weeks (around 09/04/2021).  ? ?Orders:  ?Orders Placed This Encounter  ?Procedures  ? XR Pelvis 1-2 Views  ? ?Meds ordered this encounter  ?Medications  ? oxyCODONE-acetaminophen (PERCOCET) 5-325 MG tablet  ?  Sig: Take 1-2 tablets by mouth daily as needed. To be taken after surgery  ?  Dispense:  30 tablet  ?  Refill:  0  ? ? ?Imaging: ?XR Pelvis 1-2 Views ? ?Result Date: 08/07/2021 ?Stable total hip replacement without complications  ? ?PMFS History: ?Patient Active Problem List  ? Diagnosis Date Noted  ? Primary osteoarthritis of right hip 07/23/2021  ? Degenerative joint disease of right hip 07/23/2021  ? Status post total replacement of right hip 07/23/2021  ? Abdominal bloating 10/26/2019  ? Ulcer of toe of left foot, limited to breakdown of skin ([place])  07/13/2019  ? Status post total replacement of left hip 11/16/2018  ? Vaginal prolapse 09/30/2018  ? Primary osteoarthritis of left hip 06/10/2018  ? Osteomyelitis of second toe of right foot ([place]) 04/24/2018  ? Osteomyelitis of right foot ([place]) 04/21/2018  ? 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 Callus 01/13/2018  ? Generalized anxiety disorder 10/16/2017  ? Menopause 10/16/2017  ? Diabetes mellitus, type II ([place]) 10/16/2017  ? Hyperlipidemia associated with type 2 diabetes mellitus ([place]) 10/16/2017  ? 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 Osteomyelitis of great toe of right foot ([place]) 09/25/2017  ? Pain in right foot 09/16/2017  ? Cellulitis 08/08/2017  ? Hypoglycemia without diagnosis of diabetes mellitus 08/08/2017  ? Peripheral neuropathy 08/08/2017  ? Abscess 08/08/2017  ? Hyperlipidemia LDL goal <70 09/22/2016  ? Tooth pain 09/03/2012  ? Edema 09/03/2012  ? Supraclavicular fossa fullness 06/18/2012  ? Hair loss 09/18/2010  ? Fatigue 09/18/2010  ? DYSPEPSIA&OTHER [facility] DISORDERS FUNCTION STOMACH 05/08/2010  ? DIARRHEA 05/08/2010  ? DIZZINESS 04/19/2010  ? OTHER ACUTE SINUSITIS 02/28/2010  ? NAUSEA 02/28/2010  ? Pain in right ankle and joints of right foot 02/15/2010  ? URI 07/09/2007  ? GERD 02/12/2007  ? LOW BACK PAIN 02/12/2007  ? Hypertension 09/03/2006  ?  HOT FLASHES 09/03/2006  ? INSOMNIA 09/03/2006  ? ?Past Medical History:  ?Diagnosis Date  ? Anemia   ? as a younger woman  ? Anxiety   ? Arthritis   ? "all over my body" (10/01/2017)  ? Chronic pain   ? In pain clinic   ? Constipation   ? Depression   ? Diabetic peripheral neuropathy ([place])   ? GERD (gastroesophageal reflux disease)   ? High cholesterol   ? History of hiatal hernia   ? Hypertension   ? Type II diabetes mellitus ([place]) dx'd ~ 2008  ?  ?Family History  ?Problem Relation Age of Onset  ? Cancer Mother   ?     breast  ? Depression Mother   ?     bipolar  ? Breast cancer Mother  1  ? Heart disease Father 68  ?     MI  ? Diabetes Brother   ? Kidney disease Brother   ? Hypertension Brother   ? Hyperlipidemia Brother   ? Stroke Brother   ? Coronary artery disease Other   ? Diabetes Other   ? Colon cancer Neg Hx   ? Colon polyps Neg Hx   ? Esophageal cancer Neg Hx   ? Rectal cancer Neg Hx   ? Stomach cancer Neg Hx   ?  ?Past Surgical History:  ?Procedure Laterality Date  ? AMPUTATION Right 09/29/2017  ? Procedure: AMPUTATION RIGHT 1ST RAY;  Surgeon: Leandrew Koyanagi, MD;  Location: [place];  Service: Orthopedics;  Laterality: Right;  ? AMPUTATION Right 01/29/2019  ? Procedure: RIGHT TRANSMETATARSAL AMPUTATION;  Surgeon: Newt Minion, MD;  Location: [place];  Service: Orthopedics;  Laterality: Right;  ? AMPUTATION TOE Right 04/24/2018  ? Procedure: RIGHT 2ND TOE PROXIMAL INTERPHALANGEAL JOINT DISARTICULATION;  Surgeon: Leandrew Koyanagi, MD;  Location: [facility];  Service: Orthopedics;  Laterality: Right;  ? APPLICATION OF WOUND VAC Right 09/29/2017  ? "foot"  ? BLADDER SUSPENSION  07/13/2020  ? Dr Zigmund Daniel   ? COLONOSCOPY    ? I & D EXTREMITY Right 08/10/2017  ? Procedure: IRRIGATION AND DEBRIDEMENT FOOT;  Surgeon: Leandrew Koyanagi, MD;  Location: [facility];  Service: Orthopedics;  Laterality: Right;  ? [place]  ? POLYPECTOMY    ? [place]  ? TOE SURGERY Right 1980s  ? bone removed "inbetween my toes"  ? TOTAL HIP ARTHROPLASTY Left 11/16/2018  ? Procedure: LEFT TOTAL HIP ARTHROPLASTY ANTERIOR APPROACH;  Surgeon: Leandrew Koyanagi, MD;  Location: [facility];  Service: Orthopedics;  Laterality: Left;  ? TOTAL HIP ARTHROPLASTY Right 07/23/2021  ? Procedure: RIGHT TOTAL HIP REPLACEMENT;  Surgeon: Leandrew Koyanagi, MD;  Location: [facility];  Service: Orthopedics;  Laterality: Right;  ? VAGINAL HYSTERECTOMY    ? ?Social History  ? ?Occupational History  ? Not on file  ?Tobacco Use  ? Smoking status: Never  ? Smokeless tobacco: Never  ?Vaping Use  ? Vaping Use: Never used   ?Substance and Sexual Activity  ? Alcohol use: Never  ? Drug use: Never  ? Sexual activity: Not Currently  ?  Partners: Male  ? ? ? ?

## 2021-08-15 ENCOUNTER — Ambulatory Visit (INDEPENDENT_AMBULATORY_CARE_PROVIDER_SITE_OTHER): Payer: Medicare Other | Admitting: Pharmacist

## 2021-08-15 DIAGNOSIS — E785 Hyperlipidemia, unspecified: Secondary | ICD-10-CM

## 2021-08-15 DIAGNOSIS — E1165 Type 2 diabetes mellitus with hyperglycemia: Secondary | ICD-10-CM

## 2021-08-15 DIAGNOSIS — I10 Essential (primary) hypertension: Secondary | ICD-10-CM

## 2021-08-15 DIAGNOSIS — F411 Generalized anxiety disorder: Secondary | ICD-10-CM

## 2021-08-15 MED ORDER — DAPAGLIFLOZIN PROPANEDIOL 5 MG PO TABS: 5.0000 mg | ORAL_TABLET | Freq: Every day | ORAL | 0 refills | Status: DC

## 2021-08-15 NOTE — Chronic Care Management (AMB) (Signed)
? ? ?Chronic Care Management ?Pharmacy Note ? ?08/15/2021 ?Name:  Brittany [name] MRN:  [id number] DOB:  1946-02-24 ? ?Summary:  ?Reviewed medications and updated medication list.Updated Rx at mail order for [place].  ?Reminded patient to check blood pressure 1 to 2 times per week.  ?Reminded to schedule annual eye exam, mammogram,  ? ?Subjective: ?Brittany [name] is an 76 y.o. year old female who is a primary patient of Ann Held, DO.  The CCM team was consulted for assistance with disease management and care coordination needs.   ? ?Engaged with patient by telephone for follow up visit in response to provider referral for pharmacy case management and/or care coordination services.  ? ?Consent to Services:  ?The patient was given information about Chronic Care Management services, agreed to services, and gave verbal consent prior to initiation of services.  Please see initial visit note for detailed documentation.  ? ?Patient Care Team: ?Carollee Herter, Alferd Apa, DO as PCP - General ?Nicholaus Bloom, MD as Consulting Physician (Neurology) ?Milus Banister, MD as Attending Physician (Gastroenterology) ?Leandrew Koyanagi, MD as Attending Physician (Orthopedic Surgery) ?Cherre Robins, RPH-CPP (Pharmacist) ? ? ?Recent office visits: ?07/10/2021 - Fam Med (Dr Etter SjogrenCheri Rous) Pre-Op evaluation for right hip replacement. Labs and EKG checked.  ? ? ?Recent consult visits: ?05/14/2021 - Ortho Surgery (Dr Sharol Given) Seen for right hip pain. Patient will follow-up with Dr.Xu for evaluation for right total hip arthroplasty.  I will follow-up in 3 months for evaluation of her foot ulcers and calluses. ?02/12/2021 - Ortho (Dr Sharol Given) F/U left foot ulcer (non pressure, chronic) No med changes. F/U in 3 months.  ? ? ?Hospital visits: ?07/2021 - for right hip replacement ? ?Objective: ? ?Lab Results  ?Component Value Date  ? CREATININE 0.83 07/24/2021  ? CREATININE 0.75 07/10/2021  ? CREATININE 0.70 09/01/2020  ? ? ?Lab Results   ?Component Value Date  ? HGBA1C 7.2 (H) 07/10/2021  ? ?Last diabetic Eye exam:  ?Lab Results  ?Component Value Date/Time  ? HMDIABEYEEXA No Retinopathy 10/28/2019 12:00 AM  ? HMDIABEYEEXA No Retinopathy 10/28/2019 12:00 AM  ?  ?Last diabetic Foot exam: No results found for: HMDIABFOOTEX  ? ?   ?Component Value Date/Time  ? CHOL 175 07/10/2021 1527  ? TRIG 84.0 07/10/2021 1527  ? HDL 81.60 07/10/2021 1527  ? CHOLHDL 2 07/10/2021 1527  ? VLDL 16.8 07/10/2021 1527  ? [place] 76 07/10/2021 1527  ? [place] 70 12/27/2019 1333  ? ? ? ?  Latest Ref Rng & Units 07/24/2021  ?  2:34 PM 07/10/2021  ?  3:27 PM 09/01/2020  ?  9:06 AM  ?Hepatic Function  ?Total Protein 6.5 - 8.1 g/dL 6.3   6.8   6.8    ?Albumin 3.5 - 5.0 g/dL 3.1   4.2   3.8    ?AST 15 - 41 U/L _0 ?ALT 0 - 44 U/L _1 ?Alk Phosphatase 38 - 126 U/L 40   51   53    ?Total Bilirubin 0.3 - 1.2 mg/dL 0.5   0.4   0.4    ? ? ?Lab Results  ?Component Value Date/Time  ? TSH 1.39 05/15/2017 02:49 PM  ? TSH 0.46 06/18/2012 10:26 AM  ? FREET4 1.11 09/18/2010 12:08 PM  ? FREET4 0.9 02/09/2009 02:01 PM  ? ? ? ?  Latest Ref Rng & Units 07/24/2021  ?  2:34 PM 07/10/2021  ?  3:27 PM 10/26/2019  ?  2:36 PM  ?CBC  ?WBC 4.0 - 10.5 K/uL 17.8   9.3   9.4    ?Hemoglobin 12.0 - 15.0 g/dL 11.6   12.3   12.6    ?Hematocrit 36.0 - 46.0 % 36.9   37.6   37.2    ?Platelets 150 - 400 K/uL 220   275.0   237.0    ? ? ?No results found for: VD25OH ? ?Clinical ASCVD: Yes  ?The 10-year ASCVD risk score (Arnett DK, et al., 2019) is: 27.4% ?  Values used to calculate the score: ?    Age: 64 years ?    Sex: Female ?    Is Non-Hispanic African American: No ?    Diabetic: Yes ?    Tobacco smoker: No ?    Systolic Blood Pressure: 621 mmHg ?    Is BP treated: Yes ?    HDL Cholesterol: 81.6 mg/dL ?    Total Cholesterol: 175 mg/dL   ? ? ?Social History  ? ?Tobacco Use  ?Smoking Status Never  ?Smokeless Tobacco Never  ? ?BP Readings from Last 3 Encounters:  ?07/25/21 (!) 123/57  ?07/12/21 (!)  120/51  ?07/10/21 120/70  ? ?Pulse Readings from Last 3 Encounters:  ?07/25/21 67  ?07/12/21 (!) 59  ?07/10/21 (!) 59  ? ?Wt Readings from Last 3 Encounters:  ?07/23/21 190 lb (86.2 kg)  ?07/12/21 191 lb 12.8 oz (87 kg)  ?07/10/21 190 lb 3.2 oz (86.3 kg)  ? ? ?Assessment: Review of patient past medical history, allergies, medications, health status, including review of consultants reports, laboratory and other test data, was performed as part of comprehensive evaluation and provision of chronic care management services.  ? ?SDOH:  (Social Determinants of Health) assessments and interventions performed:  ? ? ? ?CCM Care Plan ? ?Allergies  ?Allergen Reactions  ? Sulfonamide Derivatives Hives  ? ? ?Medications Reviewed Today   ? ? Reviewed by Cherre Robins, RPH-CPP (Pharmacist) on 08/15/21 at [facility] List Status: <None>  ? ?Medication Order Taking? Sig Documenting Provider Last Dose Status Informant  ?amLODipine (NORVASC) 5 MG tablet [id number] Yes TAKE 1 TABLET BY MOUTH DAILY Ann Held, DO Taking Active   ?         ?Med Note Antony Contras, Matalynn Graff B   Wed Aug 15, 2021  2:17 PM) Takes each morning  ?aspirin EC 81 MG tablet [id number] Yes Take 1 tablet (81 mg total) by mouth 2 (two) times daily. To be taken after surgery to prevent blood clots Aundra Dubin, PA-C Taking Active   ?atorvastatin (LIPITOR) 10 MG tablet [id number] Yes TAKE 1 TABLET BY MOUTH DAILY Ann Held, DO Taking Active   ?bisoprolol-hydrochlorothiazide [facility]) 10-6.25 MG tablet [id number] Yes TAKE 1 TABLET BY MOUTH DAILY Ann Held, DO Taking Active   ?Blood Glucose Monitoring Suppl (ONE TOUCH ULTRA MINI) w/Device KIT [id number] Yes Use as directed once a day.  Dx Code:  O75.64 Ann Held, DO Taking Active Self  ?calcium carbonate (TUMS - DOSED IN MG ELEMENTAL CALCIUM) 500 MG chewable tablet [id number] Yes Chew 2 tablets by mouth daily as needed for indigestion or heartburn. [provider] Taking Active Self   ?Carboxymethylcellul-Glycerin (LUBRICATING EYE DROPS OP) [id number] Yes Place 1 drop into both eyes daily as needed (dry eyes). [provider] Taking Active Self  ?dapagliflozin propanediol (FARXIGA) 5 MG TABS tablet [id number] Yes Take  1 tablet (5 mg total) by mouth daily before breakfast. Carollee Herter, Alferd Apa, DO Taking Active   ?docusate sodium (COLACE) 100 MG capsule [id number] Yes Take 1 capsule (100 mg total) by mouth daily as needed.  ?Patient taking differently: Take 100 mg by mouth 2 (two) times daily as needed.  ? Aundra Dubin, PA-C Taking Active   ?escitalopram (LEXAPRO) 10 MG tablet [id number] Yes TAKE 1 TABLET BY MOUTH DAILY Ann Held, DO Taking Active   ?estradiol (ESTRACE) 1 MG tablet [id number] Yes TAKE 1 TABLET BY MOUTH DAILY Ann Held, DO Taking Active   ?furosemide (LASIX) 20 MG tablet [id number] Yes TAKE 1 TABLET BY MOUTH DAILY Ann Held, DO Taking Active   ?gabapentin (NEURONTIN) 600 MG tablet [id number] Yes Take 600 mg by mouth 4 (four) times daily. [provider] Taking Active Self  ?glucose blood (ONE TOUCH ULTRA TEST) test strip [id number] Yes Check Blood sugar once daily Carollee Herter, Alferd Apa, DO Taking Active Self  ?JANUMET 50-1000 MG tablet [id number] Yes TAKE 2 TABLETS BY MOUTH AT  BEDTIME Ann Held, DO Taking Active   ?KLOR-CON M20 20 MEQ tablet [id number] Yes TAKE 1 TABLET BY MOUTH ONCE  DAILY Carollee Herter, Alferd Apa, DO Taking Active   ?morphine (MS CONTIN) 30 MG 12 hr tablet [id number] Yes Take 30 mg by mouth every 12 (twelve) hours. Per Dr. Nicholaus Bloom with Pain Mgmt [provider] Taking Active Self  ?Multiple Vitamin (MULTIVITAMIN) capsule [id number] Yes Take 1 capsule by mouth daily. [provider] Taking Active Self  ?omeprazole (PRILOSEC) 40 MG capsule [id number] Yes TAKE 1 CAPSULE BY MOUTH DAILY Ann Held, DO Taking Active Self  ?OneTouch Delica Lancets 02T MISC [id number] Yes Check  blood sugar once daily Carollee Herter, Alferd Apa, DO Taking Active Self  ?oxyCODONE-acetaminophen (PERCOCET) 5-325 MG tablet [id number] No Take 1-2 tablets by mouth daily as needed. To be taken after surgery  ?[place]

## 2021-08-15 NOTE — Patient Instructions (Signed)
Mrs. Fujimoto ?It was a pleasure speaking with you again today ?Below is a summary of your health goals and care plan ? ?If you have any questions or concerns, please feel free to contact me either at the phone number below or with a MyChart message.  ? ?Keep up the good work! ? ?Cherre Robins, PharmD ?Clinical Pharmacist ?[facility] ?[facility] [place] ?[phone] (direct line)  ?[phone] (main office number) ? ? ?Chronic Care Management Care Plan ?Hypertension ?Pharmacist Clinical Goal(s): ?Over the next 90 days, patient will work with PharmD and providers to maintain BP goal <140/90 ?BP Readings from Last 3 Encounters:  ?07/25/21 (!) 123/57  ?07/12/21 (!) 120/51  ?07/10/21 120/70  ? ?Current regimen:  ?Amlodipine '5mg'$  daily ?Bisoprolol-hydrochlorothiazide 10-6.'25mg'$  daily ?furosemide '20mg'$  every other day ?Intervention:  ?Reviewed refill history ?Patient self care activities - Over the next 90 days, patient will: ?Maintain hypertension medication regimen.  ?Check blood pressure once or twice per week, record and bring to future appointments.  ? ?Hyperlipidemia ?Pharmacist Clinical Goal(s): ?Over the next 90 days, patient will work with PharmD and providers to maintain LDL goal <70 ?Lipid Panel  ?   ?Component Value Date/Time  ? CHOL 175 07/10/2021 1527  ? TRIG 84.0 07/10/2021 1527  ? HDL 81.60 07/10/2021 1527  ? CHOLHDL 2 07/10/2021 1527  ? VLDL 16.8 07/10/2021 1527  ? [place] 76 07/10/2021 1527  ? [place] 70 12/27/2019 1333  ? ? ?Current regimen:  ?Atorvastatin '10mg'$  daily ?Patient self care activities - Over the next 90 days, patient will: ?Maintain cholesterol medication regimen.  ? ?Diabetes ?Pharmacist Clinical Goal(s): ?Over the next 90 days, patient will work with PharmD and providers to maintain A1c goal <7% ?Lab Results  ?Component Value Date  ? HGBA1C 7.2 (H) 07/10/2021  ? ?Current regimen:  ?Janumet 50-'1000mg'$  2 tablets daily at bedtime ?Farxiga '5mg'$  daily  ?Patient self care activities -  Over the next 90 days, patient will: ?Maintain diabetes medication regimen ?Check blood glucose once daily, record and bring to future appointments ?Discussed increasing exercise - walking, Silver [employer]neakers at [employer] or on [employer] program recommended. Increase to goal of at least 150 minutes per week. ? ?GERD / acid reflux ?Pharmacist Clinical Goal(s) ?Over the next 90 days, patient will work with PharmD and providers to reduce symptoms of GERD ?Current regimen:  ?Omeprazole '40mg'$  daily ?Tums (calcium carbonate) - take as needed for heart burn symptoms ?Patient self care activities - Over the next 90 days, patient will: ?Continue current medication for acid reflux.  ? ?Estrogen Deficiency ?Pharmacist Clinical Goal(s) ?Over the next 90 days, patient will work with PharmD and providers to reduce symptoms of estrogen deficiency and polypharmacy ?Current regimen:  ?Estradiol '1mg'$  daily ?Patient self care activities - Over the next 90 days, patient will: ?Schedule yearly mammogram ? ? ?General Anxiety:  ?Pharmacist Clinical Goal(s) ?Over the next 90 days, patient will work with PharmD and providers to maintain control of anxiety / depression ?Current regimen:  ?Escitalopram '10mg'$  daily ?Interventions: ?Continue current medication therapy  ? ?Health Maintenance: ?Reminded to get annual eye exam ?Reminded to rescheduled appointments for mammogram  ? ?Medication management ?Pharmacist Clinical Goal(s): ?Over the next 90 days, patient will work with PharmD and providers to maintain optimal medication adherence ?Current pharmacy: [facility]  ?Interventions ?Comprehensive medication review performed. ?Continue current medication management strategy ?Medication list updated ?Patient self care activities - Over the next 90 days, patient will: ?Focus on medication adherence by filling medications appropriately  ?  Take medications as prescribed ?Report any questions or concerns to PharmD and/or  provider(s) ? ?Patient Goals/Self-Care Activities ?Over the next 180 days, patient will:  ?take medications as prescribed ?Check glucose every other day, document, and provide at future appointments,  ?check blood pressure once or twice per week, document, and provide at future appointments, and  ?target a minimum of 150 minutes of moderate intensity exercise weekly ?Reschedule appointments for mammogram ([phone]) and annual eye exam ? ?The patient verbalized understanding of instructions, educational materials, and care plan provided today and agreed to receive a mailed copy of patient instructions, educational materials, and care plan.   ?

## 2021-08-30 DIAGNOSIS — G894 Chronic pain syndrome: Secondary | ICD-10-CM | POA: Diagnosis not present

## 2021-08-30 DIAGNOSIS — G4700 Insomnia, unspecified: Secondary | ICD-10-CM | POA: Diagnosis not present

## 2021-08-30 DIAGNOSIS — E1142 Type 2 diabetes mellitus with diabetic polyneuropathy: Secondary | ICD-10-CM | POA: Diagnosis not present

## 2021-08-30 DIAGNOSIS — Z79891 Long term (current) use of opiate analgesic: Secondary | ICD-10-CM | POA: Diagnosis not present

## 2021-09-04 ENCOUNTER — Encounter: Payer: Medicare Other | Admitting: Orthopaedic Surgery

## 2021-09-05 DIAGNOSIS — E785 Hyperlipidemia, unspecified: Secondary | ICD-10-CM | POA: Diagnosis not present

## 2021-09-05 DIAGNOSIS — E1151 Type 2 diabetes mellitus with diabetic peripheral angiopathy without gangrene: Secondary | ICD-10-CM | POA: Diagnosis not present

## 2021-09-05 DIAGNOSIS — I10 Essential (primary) hypertension: Secondary | ICD-10-CM

## 2021-09-05 DIAGNOSIS — Z7984 Long term (current) use of oral hypoglycemic drugs: Secondary | ICD-10-CM

## 2021-09-05 DIAGNOSIS — E1159 Type 2 diabetes mellitus with other circulatory complications: Secondary | ICD-10-CM | POA: Diagnosis not present

## 2021-09-05 DIAGNOSIS — I739 Peripheral vascular disease, unspecified: Secondary | ICD-10-CM

## 2021-09-10 ENCOUNTER — Telehealth: Payer: Self-pay | Admitting: *Deleted

## 2021-09-10 NOTE — Telephone Encounter (Signed)
Left message on machine to see if patient is using this company.

## 2021-09-11 ENCOUNTER — Encounter: Payer: Self-pay | Admitting: Orthopaedic Surgery

## 2021-09-11 ENCOUNTER — Ambulatory Visit (INDEPENDENT_AMBULATORY_CARE_PROVIDER_SITE_OTHER): Payer: Medicare Other

## 2021-09-11 ENCOUNTER — Ambulatory Visit (INDEPENDENT_AMBULATORY_CARE_PROVIDER_SITE_OTHER): Payer: Medicare Other | Admitting: Physician Assistant

## 2021-09-11 ENCOUNTER — Telehealth: Payer: Self-pay | Admitting: *Deleted

## 2021-09-11 DIAGNOSIS — Z96641 Presence of right artificial hip joint: Secondary | ICD-10-CM

## 2021-09-11 NOTE — Progress Notes (Unsigned)
Post-Op Visit Note   Patient: Brittany [name]           Date of Birth: Jun 12, 1945           MRN: [id number] Visit Date: 09/11/2021 PCP: Ann Held, DO   Assessment & Plan:  Chief Complaint:  Chief Complaint  Patient presents with   Right Hip - Follow-up    Right total hip arthroplasty 07/23/2021   Visit Diagnoses:  1. Status post total replacement of right hip     Plan: Patient is a pleasant 76 year old female who comes in today 6 weeks status post right total hip replacement 07/23/2021.  She has been doing well.  She is ambulating without assistance.  Minimal pain.  Examination of the right hip reveals painless logroll.  Full hip flexion.  At this point, she will continue to advance with activity as tolerated.  Dental prophylaxis reinforced.  Follow-up with [place] in 6 weeks time for repeat evaluation.  Call with concerns or questions.  Follow-Up Instructions: Return in about 6 weeks (around 10/23/2021).   Orders:  Orders Placed This Encounter  Procedures   XR Pelvis 1-2 Views   No orders of the defined types were placed in this encounter.   Imaging: No results found.  PMFS History: Patient Active Problem List   Diagnosis Date Noted   Primary osteoarthritis of right hip 07/23/2021   Degenerative joint disease of right hip 07/23/2021   Status post total replacement of right hip 07/23/2021   Abdominal bloating 10/26/2019   Ulcer of toe of left foot, limited to breakdown of skin ([place]) 07/13/2019   Status post total replacement of left hip 11/16/2018   Vaginal prolapse 09/30/2018   Primary osteoarthritis of left hip 06/10/2018   Osteomyelitis of second toe of right foot ([place]) 04/24/2018   Osteomyelitis of right foot ([place]) 04/21/2018   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 ([place]) 04/17/2018   Callus 01/13/2018   Generalized anxiety disorder 10/16/2017   Menopause 10/16/2017   Diabetes mellitus, type II ([place]) 10/16/2017   Hyperlipidemia associated with  type 2 diabetes mellitus ([place]) 10/16/2017   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 ([place]) 10/14/2017   Osteomyelitis of great toe of right foot ([place]) 09/25/2017   Pain in right foot 09/16/2017   Cellulitis 08/08/2017   Hypoglycemia without diagnosis of diabetes mellitus 08/08/2017   Peripheral neuropathy 08/08/2017   Abscess 08/08/2017   Hyperlipidemia LDL goal <70 09/22/2016   Tooth pain 09/03/2012   Edema 09/03/2012   Supraclavicular fossa fullness 06/18/2012   Hair loss 09/18/2010   Fatigue 09/18/2010   DYSPEPSIA&OTHER SPEC DISORDERS FUNCTION STOMACH 05/08/2010   DIARRHEA 05/08/2010   DIZZINESS 04/19/2010   OTHER ACUTE SINUSITIS 02/28/2010   NAUSEA 02/28/2010   Pain in right ankle and joints of right foot 02/15/2010   URI 07/09/2007   GERD 02/12/2007   LOW BACK PAIN 02/12/2007   Hypertension 09/03/2006   HOT FLASHES 09/03/2006   INSOMNIA 09/03/2006   Past Medical History:  Diagnosis Date   Anemia    as a younger woman   Anxiety    Arthritis    "all over my body" (10/01/2017)   Chronic pain    In pain clinic    Constipation    Depression    Diabetic peripheral neuropathy (HCC)    GERD (gastroesophageal reflux disease)    High cholesterol    History of hiatal hernia    Hypertension    Type  II diabetes mellitus ([place]) dx'd ~ 2008    Family History  Problem Relation Age of Onset   Cancer Mother        breast   Depression Mother        bipolar   Breast cancer Mother 72   Heart disease Father 58       MI   Diabetes Brother    Kidney disease Brother    Hypertension Brother    Hyperlipidemia Brother    Stroke Brother    Coronary artery disease Other    Diabetes Other    Colon cancer Neg Hx    Colon polyps Neg Hx    Esophageal cancer Neg Hx    Rectal cancer Neg Hx    Stomach cancer Neg Hx     Past Surgical History:  Procedure Laterality Date   AMPUTATION Right 09/29/2017   Procedure: AMPUTATION RIGHT 1ST RAY;  Surgeon: Leandrew Koyanagi,  MD;  Location: [place];  Service: Orthopedics;  Laterality: Right;   AMPUTATION Right 01/29/2019   Procedure: RIGHT TRANSMETATARSAL AMPUTATION;  Surgeon: Newt Minion, MD;  Location: [place];  Service: Orthopedics;  Laterality: Right;   AMPUTATION TOE Right 04/24/2018   Procedure: RIGHT 2ND TOE PROXIMAL INTERPHALANGEAL JOINT DISARTICULATION;  Surgeon: Leandrew Koyanagi, MD;  Location: [place];  Service: Orthopedics;  Laterality: Right;   APPLICATION OF WOUND VAC Right 09/29/2017   "foot"   BLADDER SUSPENSION  07/13/2020   Dr Zigmund Daniel    COLONOSCOPY     I & D EXTREMITY Right 08/10/2017   Procedure: IRRIGATION AND DEBRIDEMENT FOOT;  Surgeon: Leandrew Koyanagi, MD;  Location: [place];  Service: Orthopedics;  Laterality: Right;   LUMBAR DISC SURGERY  1992   POLYPECTOMY     POSTERIOR LUMBAR FUSION  1996   TOE SURGERY Right 1980s   bone removed "inbetween my toes"   TOTAL HIP ARTHROPLASTY Left 11/16/2018   Procedure: LEFT TOTAL HIP ARTHROPLASTY ANTERIOR APPROACH;  Surgeon: Leandrew Koyanagi, MD;  Location: [place];  Service: Orthopedics;  Laterality: Left;   TOTAL HIP ARTHROPLASTY Right 07/23/2021   Procedure: RIGHT TOTAL HIP REPLACEMENT;  Surgeon: Leandrew Koyanagi, MD;  Location: [place];  Service: Orthopedics;  Laterality: Right;   VAGINAL HYSTERECTOMY     Social History   Occupational History   Not on file  Tobacco Use   Smoking status: Never   Smokeless tobacco: Never  Vaping Use   Vaping Use: Never used  Substance and Sexual Activity   Alcohol use: Never   Drug use: Never   Sexual activity: Not Currently    Partners: Male

## 2021-09-11 NOTE — Telephone Encounter (Signed)
Pt called back stating that she was not aware of what this company was and would like clarification on what this is regarding.

## 2021-09-11 NOTE — Telephone Encounter (Signed)
Will let company so they can not send this any more.

## 2021-09-11 NOTE — Telephone Encounter (Signed)
Ortho bundle 30 day in person meeting and survey completed. 

## 2021-09-28 ENCOUNTER — Telehealth: Payer: Self-pay | Admitting: *Deleted

## 2021-09-28 NOTE — Telephone Encounter (Signed)
Attempted 60 day call to patient to check status. Left VM requesting call back with needs.

## 2021-10-04 ENCOUNTER — Ambulatory Visit: Payer: Medicare Other | Admitting: Family Medicine

## 2021-10-04 ENCOUNTER — Telehealth: Payer: Self-pay | Admitting: *Deleted

## 2021-10-04 NOTE — Telephone Encounter (Signed)
Ortho bundle 60 day call completed- attempted x 2 without reaching patient.

## 2021-10-16 ENCOUNTER — Other Ambulatory Visit: Payer: Self-pay | Admitting: Family Medicine

## 2021-10-23 ENCOUNTER — Ambulatory Visit: Payer: Medicare Other | Admitting: Orthopaedic Surgery

## 2021-10-29 ENCOUNTER — Ambulatory Visit: Payer: Medicare Other | Admitting: Family Medicine

## 2021-11-07 ENCOUNTER — Telehealth: Payer: Self-pay | Admitting: Family Medicine

## 2021-11-07 DIAGNOSIS — I10 Essential (primary) hypertension: Secondary | ICD-10-CM

## 2021-11-07 DIAGNOSIS — E1110 Type 2 diabetes mellitus with ketoacidosis without coma: Secondary | ICD-10-CM

## 2021-11-07 DIAGNOSIS — R609 Edema, unspecified: Secondary | ICD-10-CM

## 2021-11-07 DIAGNOSIS — K219 Gastro-esophageal reflux disease without esophagitis: Secondary | ICD-10-CM

## 2021-11-07 DIAGNOSIS — F411 Generalized anxiety disorder: Secondary | ICD-10-CM

## 2021-11-07 DIAGNOSIS — E1169 Type 2 diabetes mellitus with other specified complication: Secondary | ICD-10-CM

## 2021-11-07 DIAGNOSIS — Z780 Asymptomatic menopausal state: Secondary | ICD-10-CM

## 2021-11-07 MED ORDER — OMEPRAZOLE 40 MG PO CPDR: 40.0000 mg | DELAYED_RELEASE_CAPSULE | Freq: Every day | ORAL | 1 refills | Status: DC

## 2021-11-07 MED ORDER — ATORVASTATIN CALCIUM 10 MG PO TABS: 10.0000 mg | ORAL_TABLET | Freq: Every day | ORAL | 1 refills | Status: DC

## 2021-11-07 MED ORDER — AMLODIPINE BESYLATE 5 MG PO TABS: 5.0000 mg | ORAL_TABLET | Freq: Every day | ORAL | 1 refills | Status: DC

## 2021-11-07 MED ORDER — JANUMET 50-1000 MG PO TABS: 2.0000 | ORAL_TABLET | Freq: Every day | ORAL | 1 refills | Status: DC

## 2021-11-07 MED ORDER — DAPAGLIFLOZIN PROPANEDIOL 5 MG PO TABS: 5.0000 mg | ORAL_TABLET | Freq: Every day | ORAL | 1 refills | Status: DC

## 2021-11-07 MED ORDER — BISOPROLOL-HYDROCHLOROTHIAZIDE 10-6.25 MG PO TABS: 1.0000 | ORAL_TABLET | Freq: Every day | ORAL | 1 refills | Status: DC

## 2021-11-07 MED ORDER — ESTRADIOL 1 MG PO TABS: 1.0000 mg | ORAL_TABLET | Freq: Every day | ORAL | 1 refills | Status: DC

## 2021-11-07 MED ORDER — KLOR-CON M20 20 MEQ PO TBCR: 20.0000 meq | EXTENDED_RELEASE_TABLET | Freq: Every day | ORAL | 1 refills | Status: DC

## 2021-11-07 MED ORDER — FUROSEMIDE 20 MG PO TABS: 20.0000 mg | ORAL_TABLET | Freq: Every day | ORAL | 1 refills | Status: DC

## 2021-11-07 MED ORDER — TIZANIDINE HCL 4 MG PO TABS: ORAL_TABLET | ORAL | 0 refills | Status: DC

## 2021-11-07 MED ORDER — ESCITALOPRAM OXALATE 10 MG PO TABS: 10.0000 mg | ORAL_TABLET | Freq: Every day | ORAL | 1 refills | Status: DC

## 2021-11-07 NOTE — Telephone Encounter (Signed)
Rxs sent

## 2021-11-07 NOTE — Telephone Encounter (Signed)
Pt is wanting all current medications prescribed by pcp sent to [facility]. Fax [phone].

## 2021-11-12 ENCOUNTER — Ambulatory Visit: Payer: Medicare Other | Admitting: Family Medicine

## 2021-11-12 NOTE — Progress Notes (Shared)
Subjective:   By signing my name below, I, Carylon Perches, attest that this documentation has been prepared under the direction and in the presence of Ann Held DO 11/12/2021   Patient ID: Brittany [name], female    DOB: 06-23-1945, 76 y.o.   MRN: [id number]  No chief complaint on file.   HPI Patient is in today for an office visit  Past Medical History:  Diagnosis Date   Anemia    as a younger woman   Anxiety    Arthritis    "all over my body" (10/01/2017)   Chronic pain    In pain clinic    Constipation    Depression    Diabetic peripheral neuropathy ([place])    GERD (gastroesophageal reflux disease)    High cholesterol    History of hiatal hernia    Hypertension    Type II diabetes mellitus ([place]) dx'd ~ 2008    Past Surgical History:  Procedure Laterality Date   AMPUTATION Right 09/29/2017   Procedure: AMPUTATION RIGHT 1ST RAY;  Surgeon: Leandrew Koyanagi, MD;  Location: [place];  Service: Orthopedics;  Laterality: Right;   AMPUTATION Right 01/29/2019   Procedure: RIGHT TRANSMETATARSAL AMPUTATION;  Surgeon: Newt Minion, MD;  Location: [facility];  Service: Orthopedics;  Laterality: Right;   AMPUTATION TOE Right 04/24/2018   Procedure: RIGHT 2ND TOE PROXIMAL INTERPHALANGEAL JOINT DISARTICULATION;  Surgeon: Leandrew Koyanagi, MD;  Location: [place];  Service: Orthopedics;  Laterality: Right;   APPLICATION OF WOUND VAC Right 09/29/2017   "foot"   BLADDER SUSPENSION  07/13/2020   Dr Zigmund Daniel    COLONOSCOPY     I & D EXTREMITY Right 08/10/2017   Procedure: IRRIGATION AND DEBRIDEMENT FOOT;  Surgeon: Leandrew Koyanagi, MD;  Location: [facility];  Service: Orthopedics;  Laterality: Right;   LUMBAR DISC SURGERY  1992   POLYPECTOMY     POSTERIOR LUMBAR FUSION  1996   TOE SURGERY Right 1980s   bone removed "inbetween my toes"   TOTAL HIP ARTHROPLASTY Left 11/16/2018   Procedure: LEFT TOTAL HIP ARTHROPLASTY ANTERIOR APPROACH;  Surgeon: Leandrew Koyanagi, MD;  Location: [place];  Service: Orthopedics;  Laterality: Left;   TOTAL HIP ARTHROPLASTY Right 07/23/2021   Procedure: RIGHT TOTAL HIP REPLACEMENT;  Surgeon: Leandrew Koyanagi, MD;  Location: [place];  Service: Orthopedics;  Laterality: Right;   VAGINAL HYSTERECTOMY      Family History  Problem Relation Age of Onset   Cancer Mother        breast   Depression Mother        bipolar   Breast cancer Mother 85   Heart disease Father 67       MI   Diabetes Brother    Kidney disease Brother    Hypertension Brother    Hyperlipidemia Brother    Stroke Brother    Coronary artery disease Other    Diabetes Other    Colon cancer Neg Hx    Colon polyps Neg Hx    Esophageal cancer Neg Hx    Rectal cancer Neg Hx    Stomach cancer Neg Hx     Social History   Socioeconomic History   Marital status: Widowed    Spouse name: Not on file   Number of children: Not on file   Years of education: Not on file   Highest education level: Not on file  Occupational History   Not on file  Tobacco Use   Smoking status: Never   Smokeless tobacco: Never  Vaping Use   Vaping Use: Never used  Substance and Sexual Activity   Alcohol use: Never   Drug use: Never   Sexual activity: Not Currently    Partners: Male  Other Topics Concern   Not on file  Social History Narrative   Not on file   Social Determinants of Health   Financial Resource Strain: Low Risk  (01/25/2021)   Overall Financial Resource Strain (CARDIA)    Difficulty of Paying Living Expenses: Not hard at all  Food Insecurity: No Food Insecurity (01/29/2021)   Hunger Vital Sign    Worried About Running Out of Food in the Last Year: Never true    Ran Out of Food in the Last Year: Never true  Transportation Needs: No Transportation Needs (01/25/2021)   PRAPARE - Hydrologist (Medical): No    Lack of Transportation (Non-Medical): No  Physical Activity: Insufficiently Active (01/25/2021)   Exercise Vital Sign    Days of  Exercise per Week: 3 days    Minutes of Exercise per Session: 10 min  Stress: Stress Concern Present (01/25/2021)   [place]    Feeling of Stress : Rather much  Social Connections: Not on file  Intimate Partner Violence: Not At Risk (01/25/2021)   Humiliation, Afraid, Rape, and Kick questionnaire    Fear of Current or Ex-Partner: No    Emotionally Abused: No    Physically Abused: No    Sexually Abused: No    Outpatient Medications Prior to Visit  Medication Sig Dispense Refill   amLODipine (NORVASC) 5 MG tablet Take 1 tablet (5 mg total) by mouth daily. 90 tablet 1   aspirin EC 81 MG tablet Take 1 tablet (81 mg total) by mouth 2 (two) times daily. To be taken after surgery to prevent blood clots 84 tablet 0   atorvastatin (LIPITOR) 10 MG tablet Take 1 tablet (10 mg total) by mouth daily. 90 tablet 1   bisoprolol-hydrochlorothiazide (ZIAC) 10-6.25 MG tablet Take 1 tablet by mouth daily. 90 tablet 1   Blood Glucose Monitoring Suppl (ONE TOUCH ULTRA MINI) w/Device KIT Use as directed once a day.  Dx Code:  E11.42 1 each 0   calcium carbonate (TUMS - DOSED IN MG ELEMENTAL CALCIUM) 500 MG chewable tablet Chew 2 tablets by mouth daily as needed for indigestion or heartburn.     Carboxymethylcellul-Glycerin (LUBRICATING EYE DROPS OP) Place 1 drop into both eyes daily as needed (dry eyes).     dapagliflozin propanediol (FARXIGA) 5 MG TABS tablet Take 1 tablet (5 mg total) by mouth daily before breakfast. 90 tablet 1   docusate sodium (COLACE) 100 MG capsule Take 1 capsule (100 mg total) by mouth daily as needed. (Patient taking differently: Take 100 mg by mouth 2 (two) times daily as needed.) 30 capsule 2   escitalopram (LEXAPRO) 10 MG tablet Take 1 tablet (10 mg total) by mouth daily. 90 tablet 1   estradiol (ESTRACE) 1 MG tablet Take 1 tablet (1 mg total) by mouth daily. 90 tablet 1   furosemide (LASIX) 20 MG tablet Take 1  tablet (20 mg total) by mouth daily. 90 tablet 1   gabapentin (NEURONTIN) 600 MG tablet Take 600 mg by mouth 4 (four) times daily.     glucose blood (ONE TOUCH ULTRA TEST) test strip Check Blood sugar once daily 100 each 12  KLOR-CON M20 20 MEQ tablet Take 1 tablet (20 mEq total) by mouth daily. 90 tablet 1   morphine (MS CONTIN) 30 MG 12 hr tablet Take 30 mg by mouth every 12 (twelve) hours. Per Dr. Nicholaus Bloom with Pain Mgmt  0   Multiple Vitamin (MULTIVITAMIN) capsule Take 1 capsule by mouth daily.     omeprazole (PRILOSEC) 40 MG capsule Take 1 capsule (40 mg total) by mouth daily. 90 capsule 1   OneTouch Delica Lancets 61P MISC Check blood sugar once daily 100 each 12   oxyCODONE-acetaminophen (PERCOCET) 5-325 MG tablet Take 1-2 tablets by mouth daily as needed. To be taken after surgery 30 tablet 0   sitaGLIPtin-metformin (JANUMET) 50-1000 MG tablet Take 2 tablets by mouth at bedtime. 180 tablet 1   Specialty Vitamins Products (BIOTIN PLUS KERATIN PO) Take 1 capsule by mouth daily.     tiZANidine (ZANAFLEX) 4 MG tablet TAKE 1 TABLET BY MOUTH 3 TIMES  DAILY AS NEEDED FOR MUSCLE  SPASM(S 270 tablet 0   VITAMIN D PO Take 1 capsule by mouth daily.     No facility-administered medications prior to visit.    Allergies  Allergen Reactions   Sulfonamide Derivatives Hives    ROS     Objective:    Physical Exam Constitutional:      General: She is not in acute distress.    Appearance: Normal appearance. She is not ill-appearing.  HENT:     Head: Normocephalic and atraumatic.     Right Ear: External ear normal.     Left Ear: External ear normal.  Eyes:     Extraocular Movements: Extraocular movements intact.     Pupils: Pupils are equal, round, and reactive to light.  Cardiovascular:     Rate and Rhythm: Normal rate and regular rhythm.     Heart sounds: Normal heart sounds. No murmur heard.    No gallop.  Pulmonary:     Effort: Pulmonary effort is normal. No respiratory  distress.     Breath sounds: Normal breath sounds. No wheezing or rales.  Skin:    General: Skin is warm and dry.  Neurological:     Mental Status: She is alert and oriented to person, place, and time.  Psychiatric:        Judgment: Judgment normal.     There were no vitals taken for this visit. Wt Readings from Last 3 Encounters:  07/23/21 190 lb (86.2 kg)  07/12/21 191 lb 12.8 oz (87 kg)  07/10/21 190 lb 3.2 oz (86.3 kg)    Diabetic Foot Exam - Simple   No data filed    Lab Results  Component Value Date   WBC 17.8 (H) 07/24/2021   HGB 11.6 (L) 07/24/2021   HCT 36.9 07/24/2021   PLT 220 07/24/2021   GLUCOSE 154 (H) 07/24/2021   CHOL 175 07/10/2021   TRIG 84.0 07/10/2021   HDL 81.60 07/10/2021   LDLCALC 76 07/10/2021   ALT 15 07/24/2021   AST 26 07/24/2021   NA 140 07/24/2021   K 4.0 07/24/2021   CL 104 07/24/2021   CREATININE 0.83 07/24/2021   BUN 8 07/24/2021   CO2 30 07/24/2021   TSH 1.39 05/15/2017   INR 1.0 11/12/2018   HGBA1C 7.2 (H) 07/10/2021   MICROALBUR 0.8 07/10/2021    Lab Results  Component Value Date   TSH 1.39 05/15/2017   Lab Results  Component Value Date   WBC 17.8 (H) 07/24/2021   HGB 11.6 (  L) 07/24/2021   HCT 36.9 07/24/2021   MCV 84.2 07/24/2021   PLT 220 07/24/2021   Lab Results  Component Value Date   NA 140 07/24/2021   K 4.0 07/24/2021   CO2 30 07/24/2021   GLUCOSE 154 (H) 07/24/2021   BUN 8 07/24/2021   CREATININE 0.83 07/24/2021   BILITOT 0.5 07/24/2021   ALKPHOS 40 07/24/2021   AST 26 07/24/2021   ALT 15 07/24/2021   PROT 6.3 (L) 07/24/2021   ALBUMIN 3.1 (L) 07/24/2021   CALCIUM 8.6 (L) 07/24/2021   ANIONGAP 6 07/24/2021   GFR 79.05 07/10/2021   Lab Results  Component Value Date   CHOL 175 07/10/2021   Lab Results  Component Value Date   HDL 81.60 07/10/2021   Lab Results  Component Value Date   LDLCALC 76 07/10/2021   Lab Results  Component Value Date   TRIG 84.0 07/10/2021   Lab Results   Component Value Date   CHOLHDL 2 07/10/2021   Lab Results  Component Value Date   HGBA1C 7.2 (H) 07/10/2021       Assessment & Plan:   Problem List Items Addressed This Visit   None  No orders of the defined types were placed in this encounter.   I, Carylon Perches, personally preformed the services described in this documentation.  All medical record entries made by the scribe were at my direction and in my presence.  I have reviewed the chart and discharge instructions (if applicable) and agree that the record reflects my personal performance and is accurate and complete. 11/12/2021   I,Amber Collins,acting as a scribe for Ann Held, DO.,have documented all relevant documentation on the behalf of Ann Held, DO,as directed by  Ann Held, DO while in the presence of Ann Held, DO.    [employer]

## 2021-11-14 ENCOUNTER — Telehealth: Payer: Self-pay | Admitting: Pharmacist

## 2021-11-14 ENCOUNTER — Telehealth: Payer: Medicare HMO

## 2021-11-14 NOTE — Telephone Encounter (Signed)
  Care Management   Follow Up Note   11/14/2021 Name: IRINE HEMINGER MRN: [id number] DOB: 05-Aug-1945   Referred by: Ann Held, DO Reason for referral : No chief complaint on file.   An unsuccessful telephone outreach was attempted today. The patient was referred to the case management team for assistance with care management and care coordination.   It looks like patient cancelled appointment earlier in the week with PCP due to death in family. Likely unavailable to day for similar reasons.   Follow Up Plan: The care management team will reach out to the patient again over the next 30 days.   SIGNATURE

## 2021-11-15 ENCOUNTER — Ambulatory Visit: Payer: Medicare HMO | Admitting: Orthopaedic Surgery

## 2021-11-19 ENCOUNTER — Ambulatory Visit (INDEPENDENT_AMBULATORY_CARE_PROVIDER_SITE_OTHER): Payer: Medicare HMO | Admitting: Family Medicine

## 2021-11-19 ENCOUNTER — Encounter: Payer: Self-pay | Admitting: Family Medicine

## 2021-11-19 VITALS — BP 110/60 | HR 58 | Temp 99.5°F | Ht 70.0 in | Wt 182.8 lb

## 2021-11-19 DIAGNOSIS — E1110 Type 2 diabetes mellitus with ketoacidosis without coma: Secondary | ICD-10-CM | POA: Diagnosis not present

## 2021-11-19 DIAGNOSIS — Z89411 Acquired absence of right great toe: Secondary | ICD-10-CM | POA: Diagnosis not present

## 2021-11-19 DIAGNOSIS — G4700 Insomnia, unspecified: Secondary | ICD-10-CM | POA: Diagnosis not present

## 2021-11-19 DIAGNOSIS — Z1211 Encounter for screening for malignant neoplasm of colon: Secondary | ICD-10-CM

## 2021-11-19 DIAGNOSIS — E785 Hyperlipidemia, unspecified: Secondary | ICD-10-CM | POA: Diagnosis not present

## 2021-11-19 DIAGNOSIS — E1169 Type 2 diabetes mellitus with other specified complication: Secondary | ICD-10-CM | POA: Diagnosis not present

## 2021-11-19 DIAGNOSIS — Z79891 Long term (current) use of opiate analgesic: Secondary | ICD-10-CM | POA: Diagnosis not present

## 2021-11-19 DIAGNOSIS — E1142 Type 2 diabetes mellitus with diabetic polyneuropathy: Secondary | ICD-10-CM | POA: Diagnosis not present

## 2021-11-19 DIAGNOSIS — I10 Essential (primary) hypertension: Secondary | ICD-10-CM

## 2021-11-19 DIAGNOSIS — G894 Chronic pain syndrome: Secondary | ICD-10-CM | POA: Diagnosis not present

## 2021-11-19 NOTE — Assessment & Plan Note (Signed)
Stable

## 2021-11-19 NOTE — Assessment & Plan Note (Signed)
Tolerating statin, encouraged heart healthy diet, avoid trans fats, minimize simple carbs and saturated fats. Increase exercise as tolerated 

## 2021-11-19 NOTE — Patient Instructions (Signed)

## 2021-11-19 NOTE — Progress Notes (Addendum)
Subjective:   By signing my name below, I, Carylon Perches, attest that this documentation has been prepared under the direction and in the presence of Ann Held DO 11/19/2021   Patient ID: Brittany [name], female    DOB: [birth year], 76 y.o.   MRN: [id number]  Chief Complaint  Patient presents with   Hypertension    3-4 m f/u    Hyperlipidemia    3-4 m f.u    Diabetes   Follow-up    HPI Patient is in today for an office visit   As of today's visit, her blood pressure is normal. BP Readings from Last 3 Encounters:  11/19/21 110/60  07/25/21 (!) 123/57  07/12/21 (!) 120/51   Pulse Readings from Last 3 Encounters:  11/19/21 (!) 58  07/25/21 67  07/12/21 (!) 59   She reports that her blood sugar levels are good. Her sugar levels range from 120 - 159 mg/dL. She feels okay when her blood sugars are low. She has been controlling her sugar intake. Lab Results  Component Value Date   HGBA1C 7.2 (H) 07/10/2021   She states that are symptoms are controlled while taking 10 Mg of Lexapro.    She is overdue for a colonoscopy. Her colonoscopy was last done on 06/12/2010. She is not UTD on vision exams She is not UTD on her Shingles vaccine.   Past Medical History:  Diagnosis Date   Anemia    as a younger woman   Anxiety    Arthritis    "all over my body" (10/01/2017)   Chronic pain    In pain clinic    Constipation    Depression    Diabetic peripheral neuropathy (HCC)    GERD (gastroesophageal reflux disease)    High cholesterol    History of hiatal hernia    Hypertension    Type II diabetes mellitus ([place]) dx'd ~ 2008    Past Surgical History:  Procedure Laterality Date   AMPUTATION Right 09/29/2017   Procedure: AMPUTATION RIGHT 1ST RAY;  Surgeon: Leandrew Koyanagi, MD;  Location: [facility];  Service: Orthopedics;  Laterality: Right;   AMPUTATION Right 01/29/2019   Procedure: RIGHT TRANSMETATARSAL AMPUTATION;  Surgeon: Newt Minion, MD;  Location: [facility];   Service: Orthopedics;  Laterality: Right;   AMPUTATION TOE Right 04/24/2018   Procedure: RIGHT 2ND TOE PROXIMAL INTERPHALANGEAL JOINT DISARTICULATION;  Surgeon: Leandrew Koyanagi, MD;  Location: [facility];  Service: Orthopedics;  Laterality: Right;   APPLICATION OF WOUND VAC Right 09/29/2017   "foot"   BLADDER SUSPENSION  07/13/2020   Dr Zigmund Daniel    COLONOSCOPY     I & D EXTREMITY Right 08/10/2017   Procedure: IRRIGATION AND DEBRIDEMENT FOOT;  Surgeon: Leandrew Koyanagi, MD;  Location: [facility];  Service: Orthopedics;  Laterality: Right;   LUMBAR DISC SURGERY  1992   POLYPECTOMY     POSTERIOR LUMBAR FUSION  1996   TOE SURGERY Right 1980s   bone removed "inbetween my toes"   TOTAL HIP ARTHROPLASTY Left 11/16/2018   Procedure: LEFT TOTAL HIP ARTHROPLASTY ANTERIOR APPROACH;  Surgeon: Leandrew Koyanagi, MD;  Location: [facility];  Service: Orthopedics;  Laterality: Left;   TOTAL HIP ARTHROPLASTY Right 07/23/2021   Procedure: RIGHT TOTAL HIP REPLACEMENT;  Surgeon: Leandrew Koyanagi, MD;  Location: [facility];  Service: Orthopedics;  Laterality: Right;   VAGINAL HYSTERECTOMY      Family History  Problem Relation Age of Onset  Cancer Mother        breast   Depression Mother        bipolar   Breast cancer Mother 48   Heart disease Father 60       MI   Diabetes Brother    Kidney disease Brother    Hypertension Brother    Hyperlipidemia Brother    Stroke Brother    Coronary artery disease Other    Diabetes Other    Colon cancer Neg Hx    Colon polyps Neg Hx    Esophageal cancer Neg Hx    Rectal cancer Neg Hx    Stomach cancer Neg Hx     Social History   Socioeconomic History   Marital status: Widowed    Spouse name: Not on file   Number of children: Not on file   Years of education: Not on file   Highest education level: Not on file  Occupational History   Not on file  Tobacco Use   Smoking status: Never   Smokeless tobacco: Never  Vaping Use   Vaping Use: Never used  Substance  and Sexual Activity   Alcohol use: Never   Drug use: Never   Sexual activity: Not Currently    Partners: Male  Other Topics Concern   Not on file  Social History Narrative   Not on file   Social Determinants of Health   Financial Resource Strain: Low Risk  (01/25/2021)   Overall Financial Resource Strain (CARDIA)    Difficulty of Paying Living Expenses: Not hard at all  Food Insecurity: No Food Insecurity (01/29/2021)   Hunger Vital Sign    Worried About Running Out of Food in the Last Year: Never true    [place] in the Last Year: Never true  Transportation Needs: No Transportation Needs (01/25/2021)   PRAPARE - Hydrologist (Medical): No    Lack of Transportation (Non-Medical): No  Physical Activity: Insufficiently Active (01/25/2021)   Exercise Vital Sign    Days of Exercise per Week: 3 days    Minutes of Exercise per Session: 10 min  Stress: Stress Concern Present (01/25/2021)   [place]    Feeling of Stress : Rather much  Social Connections: Not on file  Intimate Partner Violence: Not At Risk (01/25/2021)   Humiliation, Afraid, Rape, and Kick questionnaire    Fear of Current or Ex-Partner: No    Emotionally Abused: No    Physically Abused: No    Sexually Abused: No    Outpatient Medications Prior to Visit  Medication Sig Dispense Refill   amLODipine (NORVASC) 5 MG tablet Take 1 tablet (5 mg total) by mouth daily. 90 tablet 1   aspirin EC 81 MG tablet Take 1 tablet (81 mg total) by mouth 2 (two) times daily. To be taken after surgery to prevent blood clots 84 tablet 0   atorvastatin (LIPITOR) 10 MG tablet Take 1 tablet (10 mg total) by mouth daily. 90 tablet 1   bisoprolol-hydrochlorothiazide (ZIAC) 10-6.25 MG tablet Take 1 tablet by mouth daily. 90 tablet 1   Blood Glucose Monitoring Suppl (ONE TOUCH ULTRA MINI) w/Device KIT Use as directed once a day.  Dx Code:   E11.42 1 each 0   calcium carbonate (TUMS - DOSED IN MG ELEMENTAL CALCIUM) 500 MG chewable tablet Chew 2 tablets by mouth daily as needed for indigestion or heartburn.     Carboxymethylcellul-Glycerin (  LUBRICATING EYE DROPS OP) Place 1 drop into both eyes daily as needed (dry eyes).     dapagliflozin propanediol (FARXIGA) 5 MG TABS tablet Take 1 tablet (5 mg total) by mouth daily before breakfast. 90 tablet 1   docusate sodium (COLACE) 100 MG capsule Take 1 capsule (100 mg total) by mouth daily as needed. (Patient taking differently: Take 100 mg by mouth 2 (two) times daily as needed.) 30 capsule 2   escitalopram (LEXAPRO) 10 MG tablet Take 1 tablet (10 mg total) by mouth daily. 90 tablet 1   estradiol (ESTRACE) 1 MG tablet Take 1 tablet (1 mg total) by mouth daily. 90 tablet 1   furosemide (LASIX) 20 MG tablet Take 1 tablet (20 mg total) by mouth daily. 90 tablet 1   gabapentin (NEURONTIN) 600 MG tablet Take 600 mg by mouth 4 (four) times daily.     glucose blood (ONE TOUCH ULTRA TEST) test strip Check Blood sugar once daily 100 each 12   KLOR-CON M20 20 MEQ tablet Take 1 tablet (20 mEq total) by mouth daily. 90 tablet 1   morphine (MS CONTIN) 30 MG 12 hr tablet Take 30 mg by mouth every 12 (twelve) hours. Per Dr. Nicholaus Bloom with Pain Mgmt  0   Multiple Vitamin (MULTIVITAMIN) capsule Take 1 capsule by mouth daily.     omeprazole (PRILOSEC) 40 MG capsule Take 1 capsule (40 mg total) by mouth daily. 90 capsule 1   OneTouch Delica Lancets 41D MISC Check blood sugar once daily 100 each 12   sitaGLIPtin-metformin (JANUMET) 50-1000 MG tablet Take 2 tablets by mouth at bedtime. 180 tablet 1   Specialty Vitamins Products (BIOTIN PLUS KERATIN PO) Take 1 capsule by mouth daily.     tiZANidine (ZANAFLEX) 4 MG tablet TAKE 1 TABLET BY MOUTH 3 TIMES  DAILY AS NEEDED FOR MUSCLE  SPASM(S 270 tablet 0   VITAMIN D PO Take 1 capsule by mouth daily.     oxyCODONE-acetaminophen (PERCOCET) 5-325 MG tablet Take 1-2  tablets by mouth daily as needed. To be taken after surgery 30 tablet 0   No facility-administered medications prior to visit.    Allergies  Allergen Reactions   Sulfonamide Derivatives Hives    Review of Systems  Constitutional:  Negative for fever and malaise/fatigue.  HENT:  Negative for congestion.   Eyes:  Negative for blurred vision.  Respiratory:  Negative for shortness of breath.   Cardiovascular:  Negative for chest pain, palpitations and leg swelling.  Gastrointestinal:  Negative for abdominal pain, blood in stool and nausea.  Genitourinary:  Negative for dysuria and frequency.  Musculoskeletal:  Negative for falls.  Skin:  Negative for rash.  Neurological:  Negative for dizziness, loss of consciousness and headaches.  Endo/Heme/Allergies:  Negative for environmental allergies.  Psychiatric/Behavioral:  Negative for depression. The patient is not nervous/anxious.        Objective:    Physical Exam Vitals and nursing note reviewed.  Constitutional:      General: She is not in acute distress.    Appearance: Normal appearance. She is well-developed. She is not ill-appearing.  HENT:     Head: Normocephalic and atraumatic.     Right Ear: External ear normal.     Left Ear: External ear normal.  Eyes:     Extraocular Movements: Extraocular movements intact.     Conjunctiva/sclera: Conjunctivae normal.     Pupils: Pupils are equal, round, and reactive to light.  Neck:     Thyroid:  No thyromegaly.     Vascular: No carotid bruit or JVD.  Cardiovascular:     Rate and Rhythm: Normal rate and regular rhythm.     Heart sounds: Normal heart sounds. No murmur heard.    No gallop.  Pulmonary:     Effort: Pulmonary effort is normal. No respiratory distress.     Breath sounds: Normal breath sounds. No wheezing or rales.  Chest:     Chest wall: No tenderness.  Musculoskeletal:     Cervical back: Normal range of motion and neck supple.  Feet:     Comments: Unable to feel  monofilament on both feet but no toes on right feet.  Skin:    General: Skin is warm and dry.  Neurological:     Mental Status: She is alert and oriented to person, place, and time.  Psychiatric:        Judgment: Judgment normal.     BP 110/60   Pulse (!) 58   Temp 99.5 F (37.5 C) (Oral)   Ht _0  (1.778 m)   Wt 182 lb 12.8 oz (82.9 kg)   SpO2 96%   BMI 26.23 kg/m  Wt Readings from Last 3 Encounters:  11/19/21 182 lb 12.8 oz (82.9 kg)  07/23/21 190 lb (86.2 kg)  07/12/21 191 lb 12.8 oz (87 kg)    Diabetic Foot Exam - Simple   Simple Foot Form  11/19/2021  9:59 AM  Visual Inspection No deformities, no ulcerations, no other skin breakdown bilaterally: Yes Sensation Testing Pulse Check Posterior Tibialis and Dorsalis pulse intact bilaterally: Yes Comments Toes amputated on r foot Unable to feel monofilament f/l feet    Lab Results  Component Value Date   WBC 17.8 (H) 07/24/2021   HGB 11.6 (L) 07/24/2021   HCT 36.9 07/24/2021   PLT 220 07/24/2021   GLUCOSE 154 (H) 07/24/2021   CHOL 175 07/10/2021   TRIG 84.0 07/10/2021   HDL 81.60 07/10/2021   LDLCALC 76 07/10/2021   ALT 15 07/24/2021   AST 26 07/24/2021   NA 140 07/24/2021   K 4.0 07/24/2021   CL 104 07/24/2021   CREATININE 0.83 07/24/2021   BUN 8 07/24/2021   CO2 30 07/24/2021   TSH 1.39 05/15/2017   INR 1.0 11/12/2018   HGBA1C 7.2 (H) 07/10/2021   MICROALBUR 0.8 07/10/2021    Lab Results  Component Value Date   TSH 1.39 05/15/2017   Lab Results  Component Value Date   WBC 17.8 (H) 07/24/2021   HGB 11.6 (L) 07/24/2021   HCT 36.9 07/24/2021   MCV 84.2 07/24/2021   PLT 220 07/24/2021   Lab Results  Component Value Date   NA 140 07/24/2021   K 4.0 07/24/2021   CO2 30 07/24/2021   GLUCOSE 154 (H) 07/24/2021   BUN 8 07/24/2021   CREATININE 0.83 07/24/2021   BILITOT 0.5 07/24/2021   ALKPHOS 40 07/24/2021   AST 26 07/24/2021   ALT 15 07/24/2021   PROT 6.3 (L) 07/24/2021   ALBUMIN 3.1  (L) 07/24/2021   CALCIUM 8.6 (L) 07/24/2021   ANIONGAP 6 07/24/2021   GFR 79.05 07/10/2021   Lab Results  Component Value Date   CHOL 175 07/10/2021   Lab Results  Component Value Date   HDL 81.60 07/10/2021   Lab Results  Component Value Date   LDLCALC 76 07/10/2021   Lab Results  Component Value Date   TRIG 84.0 07/10/2021   Lab Results  Component Value Date  CHOLHDL 2 07/10/2021   Lab Results  Component Value Date   HGBA1C 7.2 (H) 07/10/2021       Assessment & Plan:   Problem List Items Addressed This Visit       Unprioritized   Hypertension - Primary    Well controlled, no changes to meds. Encouraged heart healthy diet such as the DASH diet and exercise as tolerated.       Relevant Orders   CBC with Differential/Platelet   Comprehensive metabolic panel   Hemoglobin A1c   Microalbumin / creatinine urine ratio   Lipid panel   Hyperlipidemia associated with type 2 diabetes mellitus ([place])    Tolerating statin, encouraged heart healthy diet, avoid trans fats, minimize simple carbs and saturated fats. 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   IAnn Held, DO, personally preformed the services described in this documentation.  All medical record  entries made by the scribe were at my direction and in my presence.  I have reviewed the chart and discharge instructions (if applicable) and agree that the record reflects my personal performance and is accurate and complete. 11/19/2021  I,Amber Collins,acting as a scribe for Ann Held, DO.,have documented all relevant documentation on the behalf of Ann Held, DO,as directed by  Ann Held, DO while in the presence of Ann Held, DO.  Ann Held, DO

## 2021-11-19 NOTE — Assessment & Plan Note (Signed)
Well controlled, no changes to meds. Encouraged heart healthy diet such as the DASH diet and exercise as tolerated.  °

## 2021-11-19 NOTE — Chronic Care Management (AMB) (Signed)
  Chronic Care Management Note  11/19/2021 Name: ALEXYA MCDARIS MRN: [id number] DOB: 03/29/1946  GREGORY DOWE is a 76 y.o. year old female who is a primary care patient of Ann Held, DO and is actively engaged with the care management team. I reached out to [name] by phone today to assist with re-scheduling a follow up visit with the Pharmacist  Follow up plan: Unsuccessful telephone outreach attempt made. A HIPAA compliant phone message was left for the patient providing contact information and requesting a return call.   Julian Hy, [place] Direct Dial: [phone]

## 2021-11-23 NOTE — Chronic Care Management (AMB) (Signed)
  Chronic Care Management Note  11/23/2021 Name: Brittany [name] MRN: [id number] DOB: Jan 26, 1946  Brittany [name] is a 76 y.o. year old female who is a primary care patient of Ann Held, DO and is actively engaged with the care management team. I reached out to [name] by phone today to assist with re-scheduling a follow up visit with the Pharmacist  Follow up plan: Telephone appointment with care management team member scheduled for: 11/30/2021  Julian Hy, [place] Direct Dial: [phone]

## 2021-11-27 ENCOUNTER — Ambulatory Visit (INDEPENDENT_AMBULATORY_CARE_PROVIDER_SITE_OTHER): Payer: Medicare HMO | Admitting: Orthopaedic Surgery

## 2021-11-27 ENCOUNTER — Ambulatory Visit (INDEPENDENT_AMBULATORY_CARE_PROVIDER_SITE_OTHER): Payer: Medicare HMO

## 2021-11-27 ENCOUNTER — Encounter: Payer: Self-pay | Admitting: Orthopaedic Surgery

## 2021-11-27 DIAGNOSIS — Z96641 Presence of right artificial hip joint: Secondary | ICD-10-CM

## 2021-11-27 DIAGNOSIS — Z96642 Presence of left artificial hip joint: Secondary | ICD-10-CM

## 2021-11-27 NOTE — Progress Notes (Signed)
Post-Op Visit Note   Patient: Brittany [name]           Date of Birth: 08/17/1945           MRN: [id number] Visit Date: 11/27/2021 PCP: Ann Held, DO   Assessment & Plan:  Chief Complaint:  Chief Complaint  Patient presents with   Right Hip - Follow-up    Right total hip arthroplasty 07/23/2021   Visit Diagnoses:  1. Status post total replacement of right hip   2. Status post total replacement of left hip     Plan: Brittany [name] is 4 months status post right total hip and status post left total hip on 01/29/2019.  She is doing well from both replacements.  She has no complaints.  Examination of bilateral hips show fully healed surgical scars.  Excellent gait ambulation.  X-rays show stable implants without complications.  Brittany [name] has done very well from her right hip replacement.  Dental prophylaxis reinforced.  Recheck in 6 months with two-view x-rays of the right hip.  Follow-Up Instructions: Return in about 6 months (around 05/30/2022).   Orders:  Orders Placed This Encounter  Procedures   XR HIP UNILAT W OR W/O PELVIS 2-3 VIEWS RIGHT   No orders of the defined types were placed in this encounter.   Imaging: XR HIP UNILAT W OR W/O PELVIS 2-3 VIEWS RIGHT  Result Date: 11/27/2021 Stable bilateral total hip replacement without complication.   PMFS History: Patient Active Problem List   Diagnosis Date Noted   Primary osteoarthritis of right hip 07/23/2021   Degenerative joint disease of right hip 07/23/2021   Status post total replacement of right hip 07/23/2021   Abdominal bloating 10/26/2019   Ulcer of toe of left foot, limited to breakdown of skin ([place]) 07/13/2019   Status post total replacement of left hip 11/16/2018   Vaginal prolapse 09/30/2018   Primary osteoarthritis of left hip 06/10/2018   Osteomyelitis of second toe of right foot ([place]) 04/24/2018   Osteomyelitis of right foot ([place]) 04/21/2018   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 ([place])  04/17/2018   Callus 01/13/2018   Generalized anxiety disorder 10/16/2017   Menopause 10/16/2017   Diabetes mellitus, type II ([place]) 10/16/2017   Hyperlipidemia associated with type 2 diabetes mellitus ([place]) 10/16/2017   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 ([place]) 10/14/2017   Osteomyelitis of great toe of right foot ([place]) 09/25/2017   Pain in right foot 09/16/2017   Cellulitis 08/08/2017   Hypoglycemia without diagnosis of diabetes mellitus 08/08/2017   Peripheral neuropathy 08/08/2017   Abscess 08/08/2017   Hyperlipidemia LDL goal <70 09/22/2016   Tooth pain 09/03/2012   Edema 09/03/2012   Supraclavicular fossa fullness 06/18/2012   Hair loss 09/18/2010   Fatigue 09/18/2010   DYSPEPSIA&OTHER SPEC DISORDERS FUNCTION STOMACH 05/08/2010   DIARRHEA 05/08/2010   DIZZINESS 04/19/2010   OTHER ACUTE SINUSITIS 02/28/2010   NAUSEA 02/28/2010   Pain in right ankle and joints of right foot 02/15/2010   URI 07/09/2007   GERD 02/12/2007   LOW BACK PAIN 02/12/2007   Hypertension 09/03/2006   HOT FLASHES 09/03/2006   INSOMNIA 09/03/2006   Past Medical History:  Diagnosis Date   Anemia    as a younger woman   Anxiety    Arthritis    "all over my body" (10/01/2017)   Chronic pain    In pain clinic    Constipation    Depression  Diabetic peripheral neuropathy (HCC)    GERD (gastroesophageal reflux disease)    High cholesterol    History of hiatal hernia    Hypertension    Type II diabetes mellitus ([place]) dx'd ~ 2008    Family History  Problem Relation Age of Onset   Cancer Mother        breast   Depression Mother        bipolar   Breast cancer Mother 52   Heart disease Father 62       MI   Diabetes Brother    Kidney disease Brother    Hypertension Brother    Hyperlipidemia Brother    Stroke Brother    Coronary artery disease Other    Diabetes Other    Colon cancer Neg Hx    Colon polyps Neg Hx    Esophageal cancer Neg Hx    Rectal cancer Neg  Hx    Stomach cancer Neg Hx     Past Surgical History:  Procedure Laterality Date   AMPUTATION Right 09/29/2017   Procedure: AMPUTATION RIGHT 1ST RAY;  Surgeon: Leandrew Koyanagi, MD;  Location: [facility];  Service: Orthopedics;  Laterality: Right;   AMPUTATION Right 01/29/2019   Procedure: RIGHT TRANSMETATARSAL AMPUTATION;  Surgeon: Newt Minion, MD;  Location: [facility];  Service: Orthopedics;  Laterality: Right;   AMPUTATION TOE Right 04/24/2018   Procedure: RIGHT 2ND TOE PROXIMAL INTERPHALANGEAL JOINT DISARTICULATION;  Surgeon: Leandrew Koyanagi, MD;  Location: [facility];  Service: Orthopedics;  Laterality: Right;   APPLICATION OF WOUND VAC Right 09/29/2017   "foot"   BLADDER SUSPENSION  07/13/2020   Dr Zigmund Daniel    COLONOSCOPY     I & D EXTREMITY Right 08/10/2017   Procedure: IRRIGATION AND DEBRIDEMENT FOOT;  Surgeon: Leandrew Koyanagi, MD;  Location: [facility];  Service: Orthopedics;  Laterality: Right;   LUMBAR DISC SURGERY  1992   POLYPECTOMY     POSTERIOR LUMBAR FUSION  1996   TOE SURGERY Right 1980s   bone removed "inbetween my toes"   TOTAL HIP ARTHROPLASTY Left 11/16/2018   Procedure: LEFT TOTAL HIP ARTHROPLASTY ANTERIOR APPROACH;  Surgeon: Leandrew Koyanagi, MD;  Location: [facility];  Service: Orthopedics;  Laterality: Left;   TOTAL HIP ARTHROPLASTY Right 07/23/2021   Procedure: RIGHT TOTAL HIP REPLACEMENT;  Surgeon: Leandrew Koyanagi, MD;  Location: [facility];  Service: Orthopedics;  Laterality: Right;   VAGINAL HYSTERECTOMY     Social History   Occupational History   Not on file  Tobacco Use   Smoking status: Never   Smokeless tobacco: Never  Vaping Use   Vaping Use: Never used  Substance and Sexual Activity   Alcohol use: Never   Drug use: Never   Sexual activity: Not Currently    Partners: Male

## 2021-11-30 ENCOUNTER — Ambulatory Visit (INDEPENDENT_AMBULATORY_CARE_PROVIDER_SITE_OTHER): Payer: Medicare HMO | Admitting: Pharmacist

## 2021-11-30 DIAGNOSIS — E1169 Type 2 diabetes mellitus with other specified complication: Secondary | ICD-10-CM

## 2021-11-30 DIAGNOSIS — E1110 Type 2 diabetes mellitus with ketoacidosis without coma: Secondary | ICD-10-CM

## 2021-11-30 DIAGNOSIS — I10 Essential (primary) hypertension: Secondary | ICD-10-CM

## 2021-11-30 NOTE — Patient Instructions (Signed)
[name] It was a pleasure speaking with you today.  Below is a summary of your health goals and summary of our recent visit. You can also view your updated Chronic Care Management Care plan through your MyChart account.   Patient Goals/Self-Care Activities take medications as prescribed Check glucose every other day, document, and provide at future appointments,  check blood pressure once or twice per week, document, and provide at future appointments, and  target a minimum of 150 minutes of moderate intensity exercise weekly Reschedule appointments for mammogram ([phone]) Schedule appointment for colonoscopy and annual eye exam Recommend getting annual flu vaccine this Fall    As always if you have any questions or concerns especially regarding medications, please feel free to contact me either at the phone number below or with a MyChart message.   Keep up the good work!  Cherre Robins, PharmD Clinical Pharmacist [place] [phone] (direct line)  [phone] (main office number)   The patient verbalized understanding of instructions, educational materials, and care plan provided today and agreed to receive a mailed copy of patient instructions, educational materials, and care plan.

## 2021-11-30 NOTE — Chronic Care Management (AMB) (Signed)
Chronic Care Management Pharmacy Note  11/30/2021 Name:  Brittany [name] MRN:  [id number] DOB:  06-27-1945  Summary:  Reviewed medications and updated medication list. Patient is now using [facility] mail order since she switched to [facility].  Reminded patient to check blood pressure 1 to 2 times per week.  Reminded to schedule annual eye exam, mammogram, colonoscopy and get annual flu vaccine.  Noted patient is taking aspirin 9m twice a day. It looks like it was increased from daily to twice  for DVT prophylaxis. She had left hip replacement 07/23/2021. Will check with Dr XErlinda Hongto see if OK to change back to daily.   Subjective: Brittany WOODROOFis an 76y.o. year old female who is a primary patient of Brittany Held DO.  The CCM team was consulted for assistance with disease management and care coordination needs.    Engaged with patient by telephone for follow up visit in response to provider referral for pharmacy case management and/or care coordination services.   Consent to Services:  The patient was given information about Chronic Care Management services, agreed to services, and gave verbal consent prior to initiation of services.  Please see initial visit note for detailed documentation.   Patient Care Team: LCarollee Herter YAlferd Apa DO as PCP - General PNicholaus Bloom MD as Consulting Physician (Neurology) JMilus Banister MD as Attending Physician (Gastroenterology) XLeandrew Koyanagi MD as Attending Physician (Orthopedic Surgery) ECherre [name] RPH-CPP (Pharmacist)   Recent office visits: 11/19/2021 - Fam Med (Dr LCarollee Herter Seen for follow up HTN, HDL and Diabetes. No med changes noted.  07/10/2021 - Fam Med (Dr LEtter SjogrenCheri Rous Pre-Op evaluation for right hip replacement. Labs and EKG checked.    Recent consult visits: 11/27/2021 - Ortho (Dr XErlinda Hong follow up s/p total knee replacement. No med changes. F/U 6 months. 09/11/2021 -[place] [place] F/U right  total hip replacement on 07/23/2021. No med changes. F/U in 6 weeks.  05/14/2021 - Ortho Surgery (Dr DSharol Given Seen for right hip pain. Patient will follow-up with Dr.Xu for evaluation for right total hip arthroplasty.  I will follow-up in 3 months for evaluation of her foot ulcers and calluses. 02/12/2021 - Ortho (Dr DSharol Given F/U left foot ulcer (non pressure, chronic) No med changes. F/U in 3 months.   Hospital visits: 07/2021 - For hip replacement  Objective:  Lab Results  Component Value Date   CREATININE 0.83 07/24/2021   CREATININE 0.75 07/10/2021   CREATININE 0.70 09/01/2020    Lab Results  Component Value Date   HGBA1C 7.2 (H) 07/10/2021   Last diabetic Eye exam:  Lab Results  Component Value Date/Time   HMDIABEYEEXA No Retinopathy 10/28/2019 12:00 AM   HMDIABEYEEXA No Retinopathy 10/28/2019 12:00 AM    Last diabetic Foot exam: No results found for: "HMDIABFOOTEX"      Component Value Date/Time   CHOL 175 07/10/2021 1527   TRIG 84.0 07/10/2021 1527   HDL 81.60 07/10/2021 1527   CHOLHDL 2 07/10/2021 1527   VLDL 16.8 07/10/2021 1527   LDLCALC 76 07/10/2021 1527   LDLCALC 70 12/27/2019 1333       Latest Ref Rng & Units 07/24/2021    2:34 PM 07/10/2021    3:27 PM 09/01/2020    9:06 AM  Hepatic Function  Total Protein 6.5 - 8.1 g/dL 6.3  6.8  6.8   Albumin 3.5 - 5.0 g/dL 3.1  4.2  3.8   AST 15 - 41  U/L 26  19  17    ALT 0 - 44 U/L 15  14  12    Alk Phosphatase 38 - 126 U/L 40  51  53   Total Bilirubin 0.3 - 1.2 mg/dL 0.5  0.4  0.4     Lab Results  Component Value Date/Time   TSH 1.39 05/15/2017 02:49 PM   TSH 0.46 06/18/2012 10:26 AM   FREET4 1.11 09/18/2010 12:08 PM   FREET4 0.9 02/09/2009 02:01 PM       Latest Ref Rng & Units 07/24/2021    2:34 PM 07/10/2021    3:27 PM 10/26/2019    2:36 PM  CBC  WBC 4.0 - 10.5 K/uL 17.8  9.3  9.4   Hemoglobin 12.0 - 15.0 g/dL 11.6  12.3  12.6   Hematocrit 36.0 - 46.0 % 36.9  37.6  37.2   Platelets 150 - 400 K/uL 220  275.0   237.0     No results found for: "VD25OH"  Clinical ASCVD: Yes  The 10-year ASCVD risk score (Arnett DK, et al., 2019) is: 22.6%   Values used to calculate the score:     Age: 76 years     Sex: Female     Is Non-Hispanic African American: No     Diabetic: Yes     Tobacco smoker: No     Systolic Blood Pressure: 854 mmHg     Is BP treated: Yes     HDL Cholesterol: 81.6 mg/dL     Total Cholesterol: 175 mg/dL     Social History   Tobacco Use  Smoking Status Never  Smokeless Tobacco Never   BP Readings from Last 3 Encounters:  11/19/21 110/60  07/25/21 (!) 123/57  07/12/21 (!) 120/51   Pulse Readings from Last 3 Encounters:  11/19/21 (!) 58  07/25/21 67  07/12/21 (!) 59   Wt Readings from Last 3 Encounters:  11/19/21 182 lb 12.8 oz (82.9 kg)  07/23/21 190 lb (86.2 kg)  07/12/21 191 lb 12.8 oz (87 kg)    Assessment: Review of patient past medical history, allergies, medications, health status, including review of consultants reports, laboratory and other test data, was performed as part of comprehensive evaluation and provision of chronic care management services.   SDOH:  (Social Determinants of Health) assessments and interventions performed:  SDOH Interventions    Flowsheet Row Most Recent Value  SDOH Interventions   Physical Activity Interventions Other (Comments)  Transportation Interventions Intervention Not Indicated        CCM Care Plan  Allergies  Allergen Reactions   Sulfonamide Derivatives Hives    Medications Reviewed Today     Reviewed by Brittany [name], RPH-CPP (Pharmacist) on 11/30/21 at 1127  Med List Status: <None>   Medication Order Taking? Sig Documenting Provider Last Dose Status Informant  amLODipine (NORVASC) 5 MG tablet [id number] Yes Take 1 tablet (5 mg total) by mouth daily. Ann Held, DO Taking Active   aspirin EC 81 MG tablet [id number] Yes Take 1 tablet (81 mg total) by mouth 2 (two) times daily. To be taken after  surgery to prevent blood clots Brittany Dubin, PA-C Taking Active   atorvastatin (LIPITOR) 10 MG tablet [id number] Yes Take 1 tablet (10 mg total) by mouth daily. Roma Schanz R, DO Taking Active   bisoprolol-hydrochlorothiazide [facility]) 10-6.25 MG tablet [id number] Yes Take 1 tablet by mouth daily. Roma Schanz R, DO Taking Active   Blood Glucose Monitoring Suppl (ONE TOUCH ULTRA  MINI) w/Device KIT [id number] Yes Use as directed once a day.  Dx Code:  O16.07 Carollee Herter, Alferd Apa, DO Taking Active Self  calcium carbonate (TUMS - DOSED IN MG ELEMENTAL CALCIUM) 500 MG chewable tablet [id number] Yes Chew 2 tablets by mouth daily as needed for indigestion or heartburn. [provider] Taking Active Self  Carboxymethylcellul-Glycerin (LUBRICATING EYE DROPS OP) [id number] Yes Place 1 drop into both eyes daily as needed (dry eyes). [provider] Taking Active Self  dapagliflozin propanediol (FARXIGA) 5 MG TABS tablet [id number] Yes Take 1 tablet (5 mg total) by mouth daily before breakfast. Carollee Herter, Alferd Apa, DO Taking Active   docusate sodium (COLACE) 100 MG capsule [id number] Yes Take 1 capsule (100 mg total) by mouth daily as needed.  Patient taking differently: Take 100 mg by mouth 2 (two) times daily as needed.   Brittany Dubin, PA-C Taking Active   escitalopram (LEXAPRO) 10 MG tablet [id number] Yes Take 1 tablet (10 mg total) by mouth daily. Ann Held, DO Taking Active   estradiol (ESTRACE) 1 MG tablet [id number] Yes Take 1 tablet (1 mg total) by mouth daily. Ann Held, DO Taking Active   furosemide (LASIX) 20 MG tablet [id number] Yes Take 1 tablet (20 mg total) by mouth daily. Ann Held, DO Taking Active   gabapentin (NEURONTIN) 600 MG tablet [id number] Yes Take 600 mg by mouth 4 (four) times daily. [provider] Taking Active Self  glucose blood (ONE TOUCH ULTRA TEST) test strip [id number] Yes Check Blood sugar once daily  Carollee Herter, Alferd Apa, DO Taking Active Self  KLOR-CON M20 20 MEQ tablet [id number] Yes Take 1 tablet (20 mEq total) by mouth daily. Ann Held, DO Taking Active   morphine (MS CONTIN) 30 MG 12 hr tablet [id number] Yes Take 30 mg by mouth every 12 (twelve) hours. Per Dr. Nicholaus Bloom with Pain Mgmt [provider] Taking Active Self  Multiple Vitamin (MULTIVITAMIN) capsule [id number] Yes Take 1 capsule by mouth daily. [provider] Taking Active Self  omeprazole (PRILOSEC) 40 MG capsule [id number] Yes Take 1 capsule (40 mg total) by mouth daily. Ann Held, DO Taking Active   OneTouch Delica Lancets 35H [place] [id number] Yes Check blood sugar once daily Carollee Herter, Alferd Apa, DO Taking Active Self  sitaGLIPtin-metformin (JANUMET) 50-1000 MG tablet [id number] Yes Take 2 tablets by mouth at bedtime. Ann Held, DO Taking Active   Specialty Vitamins Products (BIOTIN PLUS KERATIN PO) [id number] Yes Take 1 capsule by mouth daily. [provider] Taking Active Self  tiZANidine (ZANAFLEX) 4 MG tablet [id number] Yes TAKE 1 TABLET BY MOUTH 3 TIMES  DAILY AS NEEDED FOR MUSCLE  SPASM(S Ann Held, DO Taking Active   VITAMIN D PO [id number] Yes Take 1,000 Units by mouth daily. [provider] Taking Active Self            Patient Active Problem List   Diagnosis Date Noted   Primary osteoarthritis of right hip 07/23/2021   Degenerative joint disease of right hip 07/23/2021   Status post total replacement of right hip 07/23/2021   Abdominal bloating 10/26/2019   Ulcer of toe of left foot, limited to breakdown of skin ([place]) 07/13/2019   Status post total replacement of left hip 11/16/2018   Vaginal prolapse 09/30/2018   Primary osteoarthritis of left hip 06/10/2018   Osteomyelitis of second toe of right foot ([place]) 04/24/2018   Osteomyelitis  of right foot ([place]) 04/21/2018   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 ([place])  04/17/2018   Callus 01/13/2018   Generalized anxiety disorder 10/16/2017   Menopause 10/16/2017   Diabetes mellitus, type II ([place]) 10/16/2017   Hyperlipidemia associated with type 2 diabetes mellitus ([place]) 10/16/2017   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 ([place]) 10/14/2017   Osteomyelitis of great toe of right foot ([place]) 09/25/2017   Pain in right foot 09/16/2017   Cellulitis 08/08/2017   Hypoglycemia without diagnosis of diabetes mellitus 08/08/2017   Peripheral neuropathy 08/08/2017   Abscess 08/08/2017   Hyperlipidemia LDL goal <70 09/22/2016   Tooth pain 09/03/2012   Edema 09/03/2012   Supraclavicular fossa fullness 06/18/2012   Hair loss 09/18/2010   Fatigue 09/18/2010   DYSPEPSIA&OTHER SPEC DISORDERS FUNCTION STOMACH 05/08/2010   DIARRHEA 05/08/2010   DIZZINESS 04/19/2010   OTHER ACUTE SINUSITIS 02/28/2010   NAUSEA 02/28/2010   Pain in right ankle and joints of right foot 02/15/2010   URI 07/09/2007   GERD 02/12/2007   LOW BACK PAIN 02/12/2007   Hypertension 09/03/2006   HOT FLASHES 09/03/2006   INSOMNIA 09/03/2006    Immunization History  Administered Date(s) Administered   DTP 07/14/2002   Fluad Quad(high Dose 65+) 12/27/2019   Influenza Whole 02/12/2007   Influenza, High Dose Seasonal PF 02/11/2014, 02/10/2015, 02/10/2015, 03/26/2018, 03/11/2019, 12/27/2019   Influenza,inj,Quad PF,6+ Mos 05/14/2013, 02/19/2016   PFIZER Comirnaty(Gray Top)Covid-19 Tri-Sucrose Vaccine 05/31/2019, 06/21/2019, 02/01/2020   Pneumococcal Conjugate-13 05/04/2015   Pneumococcal Polysaccharide-23 05/14/2013, 03/26/2018   Tdap 03/26/2018   Zoster, Live 01/02/2010    Conditions to be addressed/monitored: HTN, HLD, DMII, Anxiety, and GERD; PVD; insomnia; neuropathy  Care Plan : General Pharmacy (Adult)  Updates made by Brittany [name], RPH-CPP since 11/30/2021 12:00 AM     Problem: Type 2 DM; HTN; hyperlipidemia; GAD; chronic pain; anemia; edema; neuropathy;  PVD; GERD   Priority: High     Long-Range Goal: Chronic Care and Medication Management Pharmacy Goals   Start Date: 10/26/2020  Priority: High  Note:   Current Barriers:  Education needed regarding medication therapy and chronic conditions management.  Health maintenance needs to be addressed   Pharmacist Clinical Goal(s):  Over the next 180 days, patient will achieve adherence to monitoring guidelines and medication adherence to achieve therapeutic efficacy maintain control of hypertension , hyperlipidemia and type 2 diabetes as evidenced by attainment of goals listed below and prevention of complications  Address health maintenance needs  through collaboration with PharmD and provider.   Interventions: 1:1 collaboration with Carollee Herter, Alferd Apa, DO regarding development and update of comprehensive plan of care as evidenced by provider attestation and co-signature Inter-disciplinary care team collaboration (see longitudinal plan of care) Comprehensive medication review performed; medication list updated in electronic medical record   Hypertension Controlled;  BP goal <140/90 Home blood pressure usually 120 to 142 / 68-75 States edema is mild  Current regimen:  Amlodipine 3m daily Bisoprolol-hydrochlorothiazide  10-6.25mg daily furosemide 237mevery other day Intervention:  Reviewed refill history  Recommended maintain current hypertension medication regimen.  Recommended check blood pressure once or twice per week, record and bring to future appointments.   Hyperlipidemia / PVD Last LDL was slightly elevated at 76; LDL goal <70 Current regimen:  Atorvastatin 1023maily Aspirin 46m45mily  Interventions:  Maintain cholesterol medication regimen.  Reviewed refill history  Diabetes Controlled; A1c goal <7% Has not been checking blood glucose regularly Current regimen:  Janumet  50-1020m 2 tablets daily with evening meal Jardiance 561mdaily Diet: patient is limiting  intake of sugar, bread and starchy vegetable (potatoes and corn)  Exercise: not currently (discussed at last visit but due to going between her home and her son's home due to granddaghters accident she has not started regular exercise yet.  Interventions:  Maintain diabetes medication regimen Continue to check blood glucose every other day, record and bring to future appointments Discussed increasing exercise - walking[facility] recommended. Increase to goal of at least 150 minutes per week.   GERD / acid reflux Goal: reduce symptoms of GERD Current regimen:  Omeprazole 4022maily Tums (calcium carbonate) - take as needed for heart burn symptoms Interventions: Continue current medication for acid reflux.   Estrogen Deficiency Goal:  reduce symptoms of estrogen deficiency and polypharmacy Current regimen:  Estradiol 1mg24mily Interventions:   Discussed risks verses benefits of estrogen replacement  Reminded to get yearly mammogram  General Anxiety:  Goal: maintain control of anxiety / depression Current regimen:  Escitalopram 10mg88mly Interventions: Continue current medication therapy  Health Maintenance:  Reminded to get annual eye exam Reminded to rescheduled appointments for mammogram   Medication management Pharmacist Clinical Goal(s): Over the next 90 days, patient will work with PharmD and providers to maintain optimal medication adherence [facility] Comprehensive medication review performed. Continue current medication management strategy Medication list updated  Patient Goals/Self-Care Activities Over the next 180 days, patient will:  take medications as prescribed Check glucose every other day, document, and provide at future appointments,  check blood pressure once or twice per week, document, and provide at future appointments, and  target a minimum of 150 minutes  of moderate intensity exercise weekly Reschedule appointments for mammogram ([phone] annual eye exam  Follow Up Plan: Telephone follow up appointment with care management team member scheduled for:  3 months  Please see past updates related to this goal by clicking on the "Past Updates" button in the selected goal         Medication Assistance: None required.  Patient affirms current coverage meets needs.  Patient[facility]:  [facility] [id number]: [phone] [facility] [id number]: [phone] Up:  Patient agrees to Care Plan and Follow-up.  Plan: Telephone follow up appointment with care management team member scheduled for:  3 months  TammyCherre RobinsrmD Clinical Pharmacist [phone]

## 2021-12-04 ENCOUNTER — Other Ambulatory Visit: Payer: Medicare HMO

## 2021-12-06 ENCOUNTER — Other Ambulatory Visit (INDEPENDENT_AMBULATORY_CARE_PROVIDER_SITE_OTHER): Payer: Medicare HMO

## 2021-12-06 DIAGNOSIS — Z7985 Long-term (current) use of injectable non-insulin antidiabetic drugs: Secondary | ICD-10-CM | POA: Diagnosis not present

## 2021-12-06 DIAGNOSIS — E785 Hyperlipidemia, unspecified: Secondary | ICD-10-CM

## 2021-12-06 DIAGNOSIS — I119 Hypertensive heart disease without heart failure: Secondary | ICD-10-CM

## 2021-12-06 DIAGNOSIS — E1169 Type 2 diabetes mellitus with other specified complication: Secondary | ICD-10-CM

## 2021-12-06 DIAGNOSIS — I10 Essential (primary) hypertension: Secondary | ICD-10-CM

## 2021-12-06 DIAGNOSIS — E1110 Type 2 diabetes mellitus with ketoacidosis without coma: Secondary | ICD-10-CM

## 2021-12-06 DIAGNOSIS — N816 Rectocele: Secondary | ICD-10-CM | POA: Diagnosis not present

## 2021-12-06 DIAGNOSIS — I739 Peripheral vascular disease, unspecified: Secondary | ICD-10-CM

## 2021-12-06 LAB — COMPREHENSIVE METABOLIC PANEL
ALT: 13 U/L (ref 0–35)
AST: 14 U/L (ref 0–37)
Albumin: 4 g/dL (ref 3.5–5.2)
Alkaline Phosphatase: 52 U/L (ref 39–117)
BUN: 12 mg/dL (ref 6–23)
CO2: 30 mEq/L (ref 19–32)
Calcium: 9.3 mg/dL (ref 8.4–10.5)
Chloride: 101 mEq/L (ref 96–112)
Creatinine, Ser: 0.86 mg/dL (ref 0.40–1.20)
GFR: 66.89 mL/min (ref >60.00)
Glucose, Bld: 118 mg/dL — ABNORMAL HIGH (ref 70–99)
Potassium: 4.1 mEq/L (ref 3.5–5.1)
Sodium: 139 mEq/L (ref 135–145)
Total Bilirubin: 0.3 mg/dL (ref 0.2–1.2)
Total Protein: 6.9 g/dL (ref 6.0–8.3)

## 2021-12-06 LAB — LIPID PANEL
Cholesterol: 179 mg/dL (ref 0–200)
HDL: 84.8 mg/dL (ref >39.00)
LDL Cholesterol: 74 mg/dL (ref 0–99)
NonHDL: 94.53
Total CHOL/HDL Ratio: 2
Triglycerides: 103 mg/dL (ref 0.0–149.0)
VLDL: 20.6 mg/dL (ref 0.0–40.0)

## 2021-12-06 LAB — CBC WITH DIFFERENTIAL/PLATELET
Basophils Absolute: 0 10*3/uL (ref 0.0–0.1)
Basophils Relative: 0.5 % (ref 0.0–3.0)
Eosinophils Absolute: 0.1 10*3/uL (ref 0.0–0.7)
Eosinophils Relative: 1.6 % (ref 0.0–5.0)
HCT: 36.7 % (ref 36.0–46.0)
Hemoglobin: 11.4 g/dL — ABNORMAL LOW (ref 12.0–15.0)
Lymphocytes Relative: 18.5 % (ref 12.0–46.0)
Lymphs Abs: 1.5 10*3/uL (ref 0.7–4.0)
MCHC: 31.2 g/dL (ref 30.0–36.0)
MCV: 74 fl — ABNORMAL LOW (ref 78.0–100.0)
Monocytes Absolute: 0.7 10*3/uL (ref 0.1–1.0)
Monocytes Relative: 8.4 % (ref 3.0–12.0)
Neutro Abs: 5.8 10*3/uL (ref 1.4–7.7)
Neutrophils Relative %: 71 % (ref 43.0–77.0)
Platelets: 255 10*3/uL (ref 150.0–400.0)
RBC: 4.96 Mil/uL (ref 3.87–5.11)
RDW: 18.2 % — ABNORMAL HIGH (ref 11.5–15.5)
WBC: 8.1 10*3/uL (ref 4.0–10.5)

## 2021-12-06 LAB — MICROALBUMIN / CREATININE URINE RATIO
Creatinine,U: 72.4 mg/dL
Microalb Creat Ratio: 1.2 mg/g (ref 0.0–30.0)
Microalb, Ur: 0.8 mg/dL (ref 0.0–1.9)

## 2021-12-06 LAB — HEMOGLOBIN A1C: Hgb A1c MFr Bld: 7.3 % — ABNORMAL HIGH (ref 4.6–6.5)

## 2021-12-12 ENCOUNTER — Other Ambulatory Visit: Payer: Self-pay | Admitting: Family Medicine

## 2021-12-12 DIAGNOSIS — E1169 Type 2 diabetes mellitus with other specified complication: Secondary | ICD-10-CM

## 2021-12-12 DIAGNOSIS — E1110 Type 2 diabetes mellitus with ketoacidosis without coma: Secondary | ICD-10-CM

## 2021-12-12 DIAGNOSIS — I10 Essential (primary) hypertension: Secondary | ICD-10-CM

## 2021-12-13 ENCOUNTER — Other Ambulatory Visit: Payer: Self-pay

## 2021-12-13 MED ORDER — DAPAGLIFLOZIN PROPANEDIOL 10 MG PO TABS: 10.0000 mg | ORAL_TABLET | Freq: Every day | ORAL | 1 refills | Status: DC

## 2022-01-21 DIAGNOSIS — E1142 Type 2 diabetes mellitus with diabetic polyneuropathy: Secondary | ICD-10-CM | POA: Diagnosis not present

## 2022-01-21 DIAGNOSIS — Z79891 Long term (current) use of opiate analgesic: Secondary | ICD-10-CM | POA: Diagnosis not present

## 2022-01-21 DIAGNOSIS — G894 Chronic pain syndrome: Secondary | ICD-10-CM | POA: Diagnosis not present

## 2022-01-21 DIAGNOSIS — G4700 Insomnia, unspecified: Secondary | ICD-10-CM | POA: Diagnosis not present

## 2022-01-30 ENCOUNTER — Other Ambulatory Visit: Payer: Self-pay | Admitting: Family Medicine

## 2022-01-30 ENCOUNTER — Ambulatory Visit: Payer: Medicare HMO

## 2022-02-07 ENCOUNTER — Telehealth: Payer: Medicare HMO

## 2022-02-07 ENCOUNTER — Telehealth: Payer: Self-pay | Admitting: Family Medicine

## 2022-02-07 ENCOUNTER — Telehealth: Payer: Self-pay | Admitting: *Deleted

## 2022-02-07 NOTE — Chronic Care Management (AMB) (Signed)
  Care Coordination Note  02/07/2022 Name: Brittany [name] MRN: [id number] DOB: [birth year]  Brittany [name] is a 77 y.o. year old female who is a primary care patient of Ann Held, DO and is actively engaged with the care management team. I reached out to [name] by phone today to assist with re-scheduling a follow up visit with the Pharmacist  Follow up plan: Unsuccessful telephone outreach attempt made. A HIPAA compliant phone message was left for the patient providing contact information and requesting a return call.   Julian Hy, [place] Direct Dial: [phone]

## 2022-02-07 NOTE — Chronic Care Management (AMB) (Signed)
  Care Coordination Note  02/07/2022 Name: Brittany [name] MRN: [id number] DOB: Mar 03, 1946  Brittany [name] is a 76 y.o. year old female who is a primary care patient of Ann Held, DO and is actively engaged with the care management team. I reached out to [name] by phone today to assist with re-scheduling a follow up visit with the Pharmacist  Follow up plan: Telephone appointment with care management team member scheduled for: 02/13/2022  Julian Hy, [place] Direct Dial: [phone]

## 2022-02-07 NOTE — Telephone Encounter (Signed)
Pt has been receiving calls from [facility] and would like to know what pcp thinks of this and if she should do it. Please advise.

## 2022-02-07 NOTE — Telephone Encounter (Signed)
Spoke with patient. Pt advised she did not sign up for this. I advised patient to ignore the calls and/or tell them to stop contacting her

## 2022-02-13 ENCOUNTER — Telehealth: Payer: Self-pay | Admitting: Family Medicine

## 2022-02-13 ENCOUNTER — Ambulatory Visit: Payer: Medicare HMO | Admitting: Pharmacist

## 2022-02-13 ENCOUNTER — Other Ambulatory Visit: Payer: Self-pay | Admitting: Family Medicine

## 2022-02-13 DIAGNOSIS — Z1231 Encounter for screening mammogram for malignant neoplasm of breast: Secondary | ICD-10-CM

## 2022-02-13 DIAGNOSIS — I10 Essential (primary) hypertension: Secondary | ICD-10-CM

## 2022-02-13 DIAGNOSIS — E1165 Type 2 diabetes mellitus with hyperglycemia: Secondary | ICD-10-CM

## 2022-02-13 MED ORDER — GLUCOSE BLOOD VI STRP: ORAL_STRIP | 5 refills | Status: DC

## 2022-02-13 MED ORDER — ONETOUCH ULTRA 2 W/DEVICE KIT: PACK | 0 refills | Status: AC

## 2022-02-13 MED ORDER — ONETOUCH DELICA LANCETS 33G MISC: 5 refills | Status: DC

## 2022-02-13 NOTE — Progress Notes (Signed)
Pharmacy Note  02/13/2022 Name: Brittany [name] MRN: [id number] DOB: January 06, 1946  Subjective: Brittany [name] is a 76 y.o. year old female who is a primary care patient of Carollee Herter, Alferd Apa, DO. Clinical Pharmacist Practitioner referral was placed to assist with medication and diabetes management.    Engaged with patient by telephone for follow up visit today.  Medication Maintenance: Reviewed medications and updated medication list. Patient is now using [facility] mail order since she switched to [facility].  Medications have been synced and she is getting maintenance medications filled on same day every 90 days.   Type 2 DM:  Last A1c was 7.3 % - dose of Farxiga increased to 14m daily. She has not been checking blood glucose at home because [place].   Hypertension:  Patient has not been checking blood pressure at home. Last office blood pressure was at goal of < 130/80.   Health Maintenance:  [facility] and [facility] did not have record of this when I called them.  She is planning to get COVID next week.  Eye exam - due Mammogram - past due. Last was 2019. Was abnormal and additional testing was negative for malignancy.   Objective: Review of patient status, including review of consultants reports, laboratory and other test data, was performed as part of comprehensive evaluation and provision of chronic care management services.   Lab Results  Component Value Date   CREATININE 0.86 12/06/2021   CREATININE 0.83 07/24/2021   CREATININE 0.75 07/10/2021    Lab Results  Component Value Date   HGBA1C 7.3 (H) 12/06/2021       Component Value Date/Time   CHOL 179 12/06/2021 0823   TRIG 103.0 12/06/2021 0823   HDL 84.80 12/06/2021 0823   CHOLHDL 2 12/06/2021 0823   VLDL 20.6 12/06/2021 0823   LDLCALC  74 12/06/2021 0823   LDLCALC 70 12/27/2019 1333     Clinical ASCVD: Yes  The 10-year ASCVD risk score (Arnett DK, et al., 2019) is: 26.3%   Values used to calculate the score:     Age: [age]years     Sex: Female     Is Non-Hispanic African American: No     Diabetic: Yes     Tobacco smoker: No     Systolic Blood Pressure: 1993mmHg     Is BP treated: Yes     HDL Cholesterol: 84.8 mg/dL     Total Cholesterol: 179 mg/dL    BP Readings from Last 3 Encounters:  11/19/21 110/60  07/25/21 (!) 123/57  07/12/21 (!) 120/51     Allergies  Allergen Reactions   Sulfonamide Derivatives Hives    Medications Reviewed Today     Reviewed by ECherre Robins RPH-CPP (Pharmacist) on 02/13/22 at 1116  Med List Status: <None>   Medication Order Taking? Sig Documenting Provider Last Dose Status Informant  amLODipine (NORVASC) 5 MG tablet [id number] Take 1 tablet (5 mg total) by mouth daily. LAnn Held DO Taking Active   aspirin EC 81 MG tablet [id number] Take 1 tablet (81 mg total) by mouth 2 (two) times daily. To be taken after surgery to prevent blood clots SAundra Dubin PA-C Taking Active   atorvastatin (LIPITOR) 10 MG tablet [id number] Take 1 tablet (10 mg total) by  mouth daily. Roma Schanz R, DO Taking Active   bisoprolol-hydrochlorothiazide [facility]) 10-6.25 MG tablet [id number] Yes Take 1 tablet by mouth daily. Roma Schanz R, DO Taking Active   Blood Glucose Monitoring Suppl (ONE TOUCH ULTRA MINI) w/Device KIT [id number] No Use as directed once a day.  Dx Code:  E11.42  Patient not taking: Reported on 02/13/2022   Ann Held, DO Not Taking Active Self  calcium carbonate (TUMS - DOSED IN MG ELEMENTAL CALCIUM) 500 MG chewable tablet [id number] Yes Chew 2 tablets by mouth daily as needed for indigestion or heartburn. [provider] Taking Active Self  Carboxymethylcellul-Glycerin (LUBRICATING EYE DROPS OP) [id number] Yes Place 1 drop into both  eyes daily as needed (dry eyes). [provider] Taking Active Self  dapagliflozin propanediol (FARXIGA) 10 MG TABS tablet [id number] Yes Take 1 tablet (10 mg total) by mouth daily before breakfast. Carollee Herter, Alferd Apa, DO Taking Active   docusate sodium (COLACE) 100 MG capsule [id number] Yes Take 1 capsule (100 mg total) by mouth daily as needed.  Patient taking differently: Take 100 mg by mouth 2 (two) times daily as needed.   Aundra Dubin, PA-C Taking Active   escitalopram (LEXAPRO) 10 MG tablet [id number] Yes Take 1 tablet (10 mg total) by mouth daily. Ann Held, DO Taking Active   estradiol (ESTRACE) 1 MG tablet [id number] Yes Take 1 tablet (1 mg total) by mouth daily. Ann Held, DO Taking Active   furosemide (LASIX) 20 MG tablet [id number] Yes Take 1 tablet (20 mg total) by mouth daily. Ann Held, DO Taking Active   gabapentin (NEURONTIN) 600 MG tablet [id number] Yes Take 600 mg by mouth 4 (four) times daily. [provider] Taking Active Self  glucose blood (ONE TOUCH ULTRA TEST) test strip [id number] No Check Blood sugar once daily  Patient not taking: Reported on 02/13/2022   Ann Held, DO Not Taking Active Self  KLOR-CON M20 20 MEQ tablet [id number] Yes Take 1 tablet (20 mEq total) by mouth daily. Ann Held, DO Taking Active   morphine (MS CONTIN) 30 MG 12 hr tablet [id number] Yes Take 30 mg by mouth every 12 (twelve) hours. Per Dr. Nicholaus Bloom with Pain Mgmt [provider] Taking Active Self  Multiple Vitamin (MULTIVITAMIN) capsule [id number] Yes Take 1 capsule by mouth daily. [provider] Taking Active Self  omeprazole (PRILOSEC) 40 MG capsule [id number] Yes Take 1 capsule (40 mg total) by mouth daily. Ann Held, DO Taking Active   OneTouch Delica Lancets 45X [place] [id number] No Check blood sugar once daily  Patient not taking: Reported on 02/13/2022   Ann Held, DO Not  Taking Active Self  sitaGLIPtin-metformin (JANUMET) 50-1000 MG tablet [id number] Yes Take 2 tablets by mouth at bedtime. Ann Held, DO Taking Active   Specialty Vitamins Products (BIOTIN PLUS KERATIN PO) [id number] Yes Take 1 capsule by mouth daily. [provider] Taking Active Self  tiZANidine (ZANAFLEX) 4 MG tablet [id number] Yes Take 1 tablet (4 mg total) by mouth 3 (three) times daily as needed for muscle spasms. Ann Held, DO Taking Active   VITAMIN D PO [id number] Yes Take 1,000 Units by mouth daily. 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   Abdominal bloating 10/26/2019   Ulcer of toe of left foot, limited to breakdown of skin ([place]) 07/13/2019   Status post total replacement of left hip 11/16/2018   Vaginal prolapse 09/30/2018   Primary osteoarthritis of left hip 06/10/2018   Osteomyelitis of second toe of right foot ([place]) 04/24/2018   Osteomyelitis of right foot ([place]) 04/21/2018   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 ([place]) 04/17/2018   Callus 01/13/2018   Generalized anxiety disorder 10/16/2017   Menopause 10/16/2017   Diabetes mellitus, type II ([place]) 10/16/2017   Hyperlipidemia associated with type 2 diabetes mellitus ([place]) 10/16/2017   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 ([place]) 10/14/2017   Osteomyelitis of great toe of right foot ([place]) 09/25/2017   Pain in right foot 09/16/2017   Cellulitis 08/08/2017   Hypoglycemia without diagnosis of diabetes mellitus 08/08/2017   Peripheral neuropathy 08/08/2017   Abscess 08/08/2017   Hyperlipidemia LDL goal <70 09/22/2016   Tooth pain 09/03/2012   Edema 09/03/2012   Supraclavicular fossa fullness 06/18/2012   Hair loss 09/18/2010   Fatigue  09/18/2010   DYSPEPSIA&OTHER SPEC DISORDERS FUNCTION STOMACH 05/08/2010   DIARRHEA 05/08/2010   DIZZINESS 04/19/2010   OTHER ACUTE SINUSITIS 02/28/2010   NAUSEA 02/28/2010   Pain in right ankle and joints of right foot 02/15/2010   URI 07/09/2007   GERD 02/12/2007   LOW BACK PAIN 02/12/2007   Hypertension 09/03/2006   HOT FLASHES 09/03/2006   INSOMNIA 09/03/2006     Medication Assistance:  None required.  Patient affirms current coverage meets needs.   Assessment / Plan: Medication Maintenance: Reviewed medications and updated medication list.    Type 2 DM: No at goal of < 7.0% Sent Rx for One Touch glucometer.   Meds ordered this encounter  Medications   glucose blood (ONE TOUCH ULTRA TEST) test strip    Sig: Check Blood sugar once daily    Dispense:  100 each    Refill:  5    Dx 11.9 - type 2 DM   OneTouch Delica Lancets 50P MISC    Sig: Check blood sugar once daily    Dispense:  100 each    Refill:  5    Dx: 11.9 - type 2 DM   Blood Glucose Monitoring Suppl (ONE TOUCH ULTRA 2) w/Device KIT    Sig: Use to check blood glucose daily (Dx: E11.9 - type 2 DM)    Dispense:  1 kit    Refill:  0     Hypertension:  Continue current blood pressure medications.  Restart checking blood pressure 2 or 3 times per week.  Reviewed blood pressure goal of < 130/80.   Health Maintenance:  Get Annual flu vaccine Get COVID next week.  Provided phone number for ophthalmologist. Discussed importance of yearly eye exam.  Mammogram - past due. Last was 2019. Patient is agreeable to getting mammogram rechecked. Will send request to PCP for mammogram order (last done at [facility])   Follow Up:  Telephone follow up appointment with care management team member scheduled for:  3 to 4 months   Cherre Robins, PharmD Clinical Pharmacist [place] [phone]

## 2022-02-13 NOTE — Telephone Encounter (Signed)
Patient called back to advise that she got 3 bottles of '5mg'$  so there would not be enough to take 2 pills to make a 10 mg dose. Please call to advise.

## 2022-02-13 NOTE — Telephone Encounter (Signed)
Patient called stating she received her Farxiga pills, but they are 5 mgs instead of the usual '10mg'$ . She would like for the right dosage to be sent. Please advise.

## 2022-02-13 NOTE — Telephone Encounter (Signed)
LVM for patient. Med was increased and new Rx was sent in back on 12/13/21. Pharmacy may have given patient the wrong and/or old prescription. Pt was advised to call the pharmacy.

## 2022-02-13 NOTE — Patient Instructions (Addendum)
Get Annual flu vaccine - I checked with [facility] and they did not have a records of flu vaccine given in 2023.  Get updated COVID vaccine as planned in the next 1 to 2 week.   Mammogram - past due. Last was 2019. We are sending an order for mammogram to [facility] for you.   You are past due to have your yearly diabetic eye exam You have seen [facility] in the past Phone number:[phone] Address: [address] / [place]: Why You Should Schedule Your Annual Diabetes Eye Exam  As a person with diabetes, your annual eye exam is the best way to determine if your blood glucose (blood sugar) levels are affecting the health of your eyes. Even if your vision is completely normal and your eyes feel fine, you could be experiencing the earlier stages of a diabetes-related eye condition. The sooner you catch diabetes-related eye conditions, the sooner they can be treated to prevent them from getting worse.  Your annual eye exam is one of the best ways to protect your eyes and help prevent future vision loss  or blindness.   Your annual eye exam will look for signs of these five conditions, all of which can be largely prevented or minimized through managing healthy blood glucose (blood sugar) levels and consistently maintaining an A1C of less than 7 percent.  Retinopathy: Leakage, bleeding, and abnormal growth in the blood vessels of your eye's retina that can cause blindness Macular edema: Swelling and fluid build-up in the macula of your eye, the central part of the lightsensitive retina, often coincides with retinopathy and can cause severe vision loss.  Cataracts: Cloudiness in the lens of your eye that can cause vision loss  Glaucoma: Increased fluid pressure in your eyes that can damage the optic nerve, resulting in blindness if left untreated  Dry eye: People with diabetes are twice as likely to develop dry eye, which typically causes blurred  vision that improves with blinking, excessive watering to compensate for the dryness, and severe stinging and burning of your eyes. Elevated blood glucose levels increase the risk of infection in patients with dry eye disease, which can result from a number of causes, including high blood glucose and a number of common prescription medicines.   You should contact your optometrist immediately if you're experiencing any of the following:  Sudden difficulty reading or focusing on things close-up Sudden blurriness or double vision Pressure or pain in your eyes  Presence of flashing lights, dark spots, or missing pieces in your vision Sudden appearance of floaters (moving spots or lines), especially when these are numerous Appearance of red streaks in your vision Sudden worsening of your night vision  The most important thing to remember is that diabetes-related eye disease typically causes few or no symptoms until it is severe, so you should receive regular, dilated eye exams even when you are totally asymptomatic (no symptoms).   Your Annual Diabetes Eye Exam Includes These Four Tests:  There are four parts of your routine diabetes eye exam. These tests are not painful.  Visual acuity testing: Using an eye chart to help determine your overall vision and whether you need glasses/contacts or an update to your current prescription. Tonometry: Measures the pressure in your eye. High pressure could mean you're at risk for glaucoma, of which there are several types. Retinal imaging: Use of an imaging device that allows the eye doctor to detect very subtle  changes to the health of your eyes. Dilated eye exam: After applying eye drops that enlarge your pupils, your eye doctor will be able to look into the back of your eye to detect any swelling of the retina, leaking blood vessels, nerve damage, cataracts, and other eye diseases that are more common in people with diabetes. This is the most important test for  people with diabetes.   How Often do I Need an Eye Exam?  For most people with diabetes, there is a very clear relationship between blood glucose (blood sugar) management, A1C results, and, more recently, glucose  time-in-range and eye health. The more your blood glucose is in a safe range over time, you are less likely to develop any diabetes-related eye conditions.  Type 1 diabetes: You need to have a dilated eye exam within five years of being diagnosed and every year after that. Your first exam should include a dilated eye exam. Depending on the results, your doctor may approve waiting one to two years before you need another. If you are diagnosed with eye disease, the eye doctor  may recommend seeing you more frequently.  Type 2 diabetes: You need to have a dilated exam soon after your diabetes diagnosis. For many with type 2 diabetes, you could have been living with the disease for several years before your diagnosis, which means your eyes were at risk for damage without you or your physician even knowing it. Your first exam should include a dilated eye exam. Depending on the results, your doctor may approve waiting one to two years before you need another. If you are diagnosed with eye disease, your eye doctor may recommend seeing you more frequently.  Pregnancy with diabetes: If you are planning to get pregnant, have a complete eye exam prior to pregnancy. Once you learn you are pregnant, you'll want to have your eyes examined within the first trimester of your pregnancy, and again in the last trimester of your pregnancy to be sure that the increased stress from pregnancy hasn't affected your eye health.Pregnancy can increase the pressure on blood vessels throughout your eyes, increasing your risk of diabetes-related eye conditions, especially if you had signs of eye damage prior to getting pregnant.  Learn more at [employer].diabetes.org  1-800-DIABETES (506)626-4723)  This article is for  informational purposes only and does not constitute medical advice. Always seek the advice of your physician or other qualified  health provider with any questions you may have regarding a medical condition

## 2022-02-13 NOTE — Progress Notes (Signed)
Pharmacy Note  02/13/2022 Name: Brittany [name] MRN: [id number] DOB: 05/16/1945  Subjective: Brittany [name] is a 76 y.o. year old female who is a primary care patient of Carollee Herter, Alferd Apa, DO. Clinical Pharmacist Practitioner referral was placed to assist with medication and diabetes management.    Engaged with patient by telephone for follow up visit today.  Medication Maintenance: Reviewed medications and updated medication list. Patient is now using [facility] mail order since she switched to [facility].  Medications have been synced and she is getting maintenance medications filled on same day every 90 days.   Type 2 DM:  Last A1c was 7.3 % - dose of Farxiga increased to 64m daily. She has not been checking blood glucose at home because [place].   Hypertension:  Patient has not been checking blood pressure at home. Last office blood pressure was at goal of < 130/80.   Health [facility]:  [facility]   She is planning to get COVID next week.  Eye exam - due Mammogram - past due. Last was 2019. Was abnormal and additional testing was negative for malignancy.   Objective: Review of patient status, including review of consultants reports, laboratory and other test data, was performed as part of comprehensive evaluation and provision of chronic care management services.   Lab Results  Component Value Date   CREATININE 0.86 12/06/2021   CREATININE 0.83 07/24/2021   CREATININE 0.75 07/10/2021    Lab Results  Component Value Date   HGBA1C 7.3 (H) 12/06/2021       Component Value Date/Time   CHOL 179 12/06/2021 0823   TRIG 103.0 12/06/2021 0823   HDL 84.80 12/06/2021 0823   CHOLHDL 2 12/06/2021 0823   VLDL 20.6 12/06/2021 0823   LDLCALC 74 12/06/2021 0823   LDLCALC 70 12/27/2019 1333     Clinical ASCVD:  Yes  The 10-year ASCVD risk score (Arnett DK, et al., 2019) is: 26.3%   Values used to calculate the score:     Age: [age]years     Sex: Female     Is Non-Hispanic African American: No     Diabetic: Yes     Tobacco smoker: No     Systolic Blood Pressure: 1300mmHg     Is BP treated: Yes     HDL Cholesterol: 84.8 mg/dL     Total Cholesterol: 179 mg/dL    BP Readings from Last 3 Encounters:  11/19/21 110/60  07/25/21 (!) 123/57  07/12/21 (!) 120/51     Allergies  Allergen Reactions   Sulfonamide Derivatives Hives    Medications Reviewed Today     Reviewed by ECherre Robins RPH-CPP (Pharmacist) on 02/13/22 at 1116  Med List Status: <None>   Medication Order Taking? Sig Documenting Provider Last Dose Status Informant  amLODipine (NORVASC) 5 MG tablet [id number] Take 1 tablet (5 mg total) by mouth daily. LAnn Held DO Taking Active   aspirin EC 81 MG tablet [id number] Take 1 tablet (81 mg total) by mouth 2 (two) times daily. To be taken after surgery to prevent blood clots SAundra Dubin PA-C Taking Active   atorvastatin (LIPITOR) 10 MG tablet [id number] Take 1 tablet (10 mg total) by mouth daily. LRoma SchanzR, DO Taking Active   bisoprolol-hydrochlorothiazide ([facility] 10-6.25  MG tablet [id number] Yes Take 1 tablet by mouth daily. Roma Schanz R, DO Taking Active   Blood Glucose Monitoring Suppl (ONE TOUCH ULTRA MINI) w/Device KIT [id number] No Use as directed once a day.  Dx Code:  E11.42  Patient not taking: Reported on 02/13/2022   Ann Held, DO Not Taking Active Self  calcium carbonate (TUMS - DOSED IN MG ELEMENTAL CALCIUM) 500 MG chewable tablet [id number] Yes Chew 2 tablets by mouth daily as needed for indigestion or heartburn. [provider] Taking Active Self  Carboxymethylcellul-Glycerin (LUBRICATING EYE DROPS OP) [id number] Yes Place 1 drop into both eyes daily as needed (dry eyes). [provider] Taking Active  Self  dapagliflozin propanediol (FARXIGA) 10 MG TABS tablet [id number] Yes Take 1 tablet (10 mg total) by mouth daily before breakfast. Carollee Herter, Alferd Apa, DO Taking Active   docusate sodium (COLACE) 100 MG capsule [id number] Yes Take 1 capsule (100 mg total) by mouth daily as needed.  Patient taking differently: Take 100 mg by mouth 2 (two) times daily as needed.   Aundra Dubin, PA-C Taking Active   escitalopram (LEXAPRO) 10 MG tablet [id number] Yes Take 1 tablet (10 mg total) by mouth daily. Ann Held, DO Taking Active   estradiol (ESTRACE) 1 MG tablet [id number] Yes Take 1 tablet (1 mg total) by mouth daily. Ann Held, DO Taking Active   furosemide (LASIX) 20 MG tablet [id number] Yes Take 1 tablet (20 mg total) by mouth daily. Ann Held, DO Taking Active   gabapentin (NEURONTIN) 600 MG tablet [id number] Yes Take 600 mg by mouth 4 (four) times daily. [provider] Taking Active Self  glucose blood (ONE TOUCH ULTRA TEST) test strip [id number] No Check Blood sugar once daily  Patient not taking: Reported on 02/13/2022   Ann Held, DO Not Taking Active Self  KLOR-CON M20 20 MEQ tablet [id number] Yes Take 1 tablet (20 mEq total) by mouth daily. Ann Held, DO Taking Active   morphine (MS CONTIN) 30 MG 12 hr tablet [id number] Yes Take 30 mg by mouth every 12 (twelve) hours. Per Dr. Nicholaus Bloom with Pain Mgmt [provider] Taking Active Self  Multiple Vitamin (MULTIVITAMIN) capsule [id number] Yes Take 1 capsule by mouth daily. [provider] Taking Active Self  omeprazole (PRILOSEC) 40 MG capsule [id number] Yes Take 1 capsule (40 mg total) by mouth daily. Ann Held, DO Taking Active   OneTouch Delica Lancets 93G [place] [id number] No Check blood sugar once daily  Patient not taking: Reported on 02/13/2022   Ann Held, DO Not Taking Active Self  sitaGLIPtin-metformin (JANUMET) 50-1000 MG tablet  [id number] Yes Take 2 tablets by mouth at bedtime. Ann Held, DO Taking Active   Specialty Vitamins Products (BIOTIN PLUS KERATIN PO) [id number] Yes Take 1 capsule by mouth daily. [provider] Taking Active Self  tiZANidine (ZANAFLEX) 4 MG tablet [id number] Yes Take 1 tablet (4 mg total) by mouth 3 (three) times daily as needed for muscle spasms. Ann Held, DO Taking Active   VITAMIN D PO [id number] Yes Take 1,000 Units by mouth daily. 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   Abdominal bloating 10/26/2019   Ulcer of toe of left foot, limited to breakdown of skin ([place]) 07/13/2019   Status post total replacement of left hip 11/16/2018   Vaginal prolapse 09/30/2018   Primary osteoarthritis of left hip 06/10/2018   Osteomyelitis of second toe of right foot ([place]) 04/24/2018   Osteomyelitis of right foot ([place]) 04/21/2018   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 ([place]) 04/17/2018   Callus 01/13/2018   Generalized anxiety disorder 10/16/2017   Menopause 10/16/2017   Diabetes mellitus, type II ([place]) 10/16/2017   Hyperlipidemia associated with type 2 diabetes mellitus ([place]) 10/16/2017   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 ([place]) 10/14/2017   Osteomyelitis of great toe of right foot ([place]) 09/25/2017   Pain in right foot 09/16/2017   Cellulitis 08/08/2017   Hypoglycemia without diagnosis of diabetes mellitus 08/08/2017   Peripheral neuropathy 08/08/2017   Abscess 08/08/2017   Hyperlipidemia LDL goal <70 09/22/2016   Tooth pain 09/03/2012   Edema 09/03/2012   Supraclavicular fossa fullness 06/18/2012   Hair loss 09/18/2010   Fatigue 09/18/2010   DYSPEPSIA&OTHER SPEC DISORDERS FUNCTION STOMACH 05/08/2010    DIARRHEA 05/08/2010   DIZZINESS 04/19/2010   OTHER ACUTE SINUSITIS 02/28/2010   NAUSEA 02/28/2010   Pain in right ankle and joints of right foot 02/15/2010   URI 07/09/2007   GERD 02/12/2007   LOW BACK PAIN 02/12/2007   Hypertension 09/03/2006   HOT FLASHES 09/03/2006   INSOMNIA 09/03/2006     Medication Assistance:  None required.  Patient affirms current coverage meets needs.   Assessment / Plan: Medication Maintenance: Reviewed medications and updated medication list.    Type 2 DM: No at goal of < 7.0% Sent Rx for One Touch glucometer.   Meds ordered this encounter  Medications   glucose blood (ONE TOUCH ULTRA TEST) test strip    Sig: Check Blood sugar once daily    Dispense:  100 each    Refill:  5    Dx 11.9 - type 2 DM   OneTouch Delica Lancets 28Z MISC    Sig: Check blood sugar once daily    Dispense:  100 each    Refill:  5    Dx: 11.9 - type 2 DM   Blood Glucose Monitoring Suppl (ONE TOUCH ULTRA 2) w/Device KIT    Sig: Use to check blood glucose daily (Dx: E11.9 - type 2 DM)    Dispense:  1 kit    Refill:  0     Hypertension:  Continue current blood pressure medications.  Restart checking blood pressure 2 or 3 times per week.  Reviewed blood pressure goal of < 130/80.   Health Maintenance:  Called [facility] got get date of annual flu vaccine and updated patient record.  Get COVID next week.  Provided phone number for ophthalmologist. Discussed importance of yearly eye exam.  Mammogram - past due. Last was 2019. Patient is agreeable to getting mammogram rechecked. Will send request to PCP for mammogram order (last done at [facility])   Follow Up:  Telephone follow up appointment with care management team member scheduled for:  3 to 4 months   Cherre Robins, PharmD Clinical Pharmacist [place] [phone]

## 2022-02-13 NOTE — Telephone Encounter (Signed)
Pt called. Left VM. I advised patient to look to at the qty of pills and/or bottles since in Epic we have her taking 10 MG. Pharmacy may have been out of 10 MG

## 2022-02-26 ENCOUNTER — Ambulatory Visit: Payer: Medicare HMO

## 2022-03-12 ENCOUNTER — Other Ambulatory Visit (INDEPENDENT_AMBULATORY_CARE_PROVIDER_SITE_OTHER): Payer: Medicare HMO

## 2022-03-12 DIAGNOSIS — E785 Hyperlipidemia, unspecified: Secondary | ICD-10-CM

## 2022-03-12 DIAGNOSIS — E1110 Type 2 diabetes mellitus with ketoacidosis without coma: Secondary | ICD-10-CM

## 2022-03-12 DIAGNOSIS — E1169 Type 2 diabetes mellitus with other specified complication: Secondary | ICD-10-CM

## 2022-03-12 DIAGNOSIS — I10 Essential (primary) hypertension: Secondary | ICD-10-CM | POA: Diagnosis not present

## 2022-03-12 LAB — COMPREHENSIVE METABOLIC PANEL
ALT: 14 U/L (ref 0–35)
AST: 17 U/L (ref 0–37)
Albumin: 4.3 g/dL (ref 3.5–5.2)
Alkaline Phosphatase: 51 U/L (ref 39–117)
BUN: 13 mg/dL (ref 6–23)
CO2: 34 mEq/L — ABNORMAL HIGH (ref 19–32)
Calcium: 9.7 mg/dL (ref 8.4–10.5)
Chloride: 99 mEq/L (ref 96–112)
Creatinine, Ser: 0.83 mg/dL (ref 0.40–1.20)
GFR: 69.67 mL/min (ref >60.00)
Glucose, Bld: 104 mg/dL — ABNORMAL HIGH (ref 70–99)
Potassium: 4.1 mEq/L (ref 3.5–5.1)
Sodium: 140 mEq/L (ref 135–145)
Total Bilirubin: 0.3 mg/dL (ref 0.2–1.2)
Total Protein: 7.1 g/dL (ref 6.0–8.3)

## 2022-03-12 LAB — LIPID PANEL
Cholesterol: 180 mg/dL (ref 0–200)
HDL: 90.8 mg/dL (ref >39.00)
LDL Cholesterol: 73 mg/dL (ref 0–99)
NonHDL: 88.75
Total CHOL/HDL Ratio: 2
Triglycerides: 80 mg/dL (ref 0.0–149.0)
VLDL: 16 mg/dL (ref 0.0–40.0)

## 2022-03-12 LAB — HEMOGLOBIN A1C: Hgb A1c MFr Bld: 6.6 % — ABNORMAL HIGH (ref 4.6–6.5)

## 2022-03-13 ENCOUNTER — Encounter: Payer: Self-pay | Admitting: Family Medicine

## 2022-03-21 ENCOUNTER — Ambulatory Visit: Payer: Medicare HMO

## 2022-03-26 ENCOUNTER — Ambulatory Visit (INDEPENDENT_AMBULATORY_CARE_PROVIDER_SITE_OTHER): Payer: Medicare HMO | Admitting: *Deleted

## 2022-03-26 DIAGNOSIS — Z1231 Encounter for screening mammogram for malignant neoplasm of breast: Secondary | ICD-10-CM | POA: Diagnosis not present

## 2022-03-26 DIAGNOSIS — Z1211 Encounter for screening for malignant neoplasm of colon: Secondary | ICD-10-CM | POA: Diagnosis not present

## 2022-03-26 DIAGNOSIS — Z0000 Encounter for general adult medical examination without abnormal findings: Secondary | ICD-10-CM

## 2022-03-26 DIAGNOSIS — Z780 Asymptomatic menopausal state: Secondary | ICD-10-CM | POA: Diagnosis not present

## 2022-03-26 NOTE — Progress Notes (Signed)
Subjective:   Brittany [name] is a 76 y.o. female who presents for Medicare Annual (Subsequent) preventive examination.  I connected with  JACOBI RYANT on 03/26/22 by a audio enabled telemedicine application and verified that I am speaking with the correct person using two identifiers.  Patient Location: Home  Provider Location: Office/Clinic  I discussed the limitations of evaluation and management by telemedicine. The patient expressed understanding and agreed to proceed.   Review of Systems    Defer to PCP Cardiac Risk Factors include: advanced age (>1mn, >>9women);diabetes mellitus;dyslipidemia;hypertension     Objective:    There were no vitals filed for this visit. There is no height or weight on file to calculate BMI.     03/26/2022    2:23 PM 07/25/2021    1:00 AM 07/23/2021   11:31 AM 07/12/2021   10:05 AM 01/25/2021    2:31 PM 12/02/2019    7:42 AM 01/29/2019    9:44 AM  Advanced Directives  Does Patient Have a Medical Advance Directive? No No Yes Yes Yes No No  Type of Advance Directive   Living will Living will HLewisLiving will    Does patient want to make changes to medical advance directive?     Yes (MAU/Ambulatory/Procedural Areas - Information given)    Would patient like information on creating a medical advance directive? No - Patient declined No - Patient declined    No - Patient declined     Current Medications (verified) Outpatient Encounter Medications as of 03/26/2022  Medication Sig   amLODipine (NORVASC) 5 MG tablet Take 1 tablet (5 mg total) by mouth daily.   aspirin EC 81 MG tablet Take 1 tablet (81 mg total) by mouth 2 (two) times daily. To be taken after surgery to prevent blood clots   atorvastatin (LIPITOR) 10 MG tablet Take 1 tablet (10 mg total) by mouth daily.   bisoprolol-hydrochlorothiazide (ZIAC) 10-6.25 MG tablet Take 1 tablet by mouth daily.   Blood Glucose Monitoring Suppl (ONE TOUCH ULTRA 2) w/Device KIT  Use to check blood glucose daily (Dx: E11.9 - type 2 DM)   calcium carbonate (TUMS - DOSED IN MG ELEMENTAL CALCIUM) 500 MG chewable tablet Chew 2 tablets by mouth daily as needed for indigestion or heartburn.   Carboxymethylcellul-Glycerin (LUBRICATING EYE DROPS OP) Place 1 drop into both eyes daily as needed (dry eyes).   dapagliflozin propanediol (FARXIGA) 10 MG TABS tablet Take 1 tablet (10 mg total) by mouth daily before breakfast.   docusate sodium (COLACE) 100 MG capsule Take 1 capsule (100 mg total) by mouth daily as needed. (Patient taking differently: Take 100 mg by mouth 2 (two) times daily as needed.)   doxepin (SINEQUAN) 10 MG capsule Take 10 mg by mouth at bedtime as needed.   escitalopram (LEXAPRO) 10 MG tablet Take 1 tablet (10 mg total) by mouth daily.   estradiol (ESTRACE) 1 MG tablet Take 1 tablet (1 mg total) by mouth daily.   furosemide (LASIX) 20 MG tablet Take 1 tablet (20 mg total) by mouth daily.   gabapentin (NEURONTIN) 600 MG tablet Take 600 mg by mouth 4 (four) times daily.   glucose blood (ONE TOUCH ULTRA TEST) test strip Check Blood sugar once daily   KLOR-CON M20 20 MEQ tablet Take 1 tablet (20 mEq total) by mouth daily.   morphine (MS CONTIN) 30 MG 12 hr tablet Take 30 mg by mouth every 12 (twelve) hours. Per Dr. MNicholaus Bloomwith  Pain Mgmt   Multiple Vitamin (MULTIVITAMIN) capsule Take 1 capsule by mouth daily.   omeprazole (PRILOSEC) 40 MG capsule Take 1 capsule (40 mg total) by mouth daily.   OneTouch Delica Lancets 34K MISC Check blood sugar once daily   sitaGLIPtin-metformin (JANUMET) 50-1000 MG tablet Take 2 tablets by mouth at bedtime.   Specialty Vitamins Products (BIOTIN PLUS KERATIN PO) Take 1 capsule by mouth daily.   tiZANidine (ZANAFLEX) 4 MG tablet Take 1 tablet (4 mg total) by mouth 3 (three) times daily as needed for muscle spasms.   VITAMIN D PO Take 1,000 Units by mouth daily.   No facility-administered encounter medications on file as of  03/26/2022.    Allergies (verified) Sulfonamide derivatives   History: Past Medical History:  Diagnosis Date   Anemia    as a younger woman   Anxiety    Arthritis    "all over my body" (10/01/2017)   Chronic pain    In pain clinic    Constipation    Depression    Diabetic peripheral neuropathy (HCC)    GERD (gastroesophageal reflux disease)    High cholesterol    History of hiatal hernia    Hypertension    Type II diabetes mellitus ([place]) dx'd ~ 2008   Past Surgical History:  Procedure Laterality Date   AMPUTATION Right 09/29/2017   Procedure: AMPUTATION RIGHT 1ST RAY;  Surgeon: Leandrew Koyanagi, MD;  Location: [facility];  Service: Orthopedics;  Laterality: Right;   AMPUTATION Right 01/29/2019   Procedure: RIGHT TRANSMETATARSAL AMPUTATION;  Surgeon: Newt Minion, MD;  Location: [facility];  Service: Orthopedics;  Laterality: Right;   AMPUTATION TOE Right 04/24/2018   Procedure: RIGHT 2ND TOE PROXIMAL INTERPHALANGEAL JOINT DISARTICULATION;  Surgeon: Leandrew Koyanagi, MD;  Location: [facility];  Service: Orthopedics;  Laterality: Right;   APPLICATION OF WOUND VAC Right 09/29/2017   "foot"   BLADDER SUSPENSION  07/13/2020   Dr Zigmund Daniel    COLONOSCOPY     I & D EXTREMITY Right 08/10/2017   Procedure: IRRIGATION AND DEBRIDEMENT FOOT;  Surgeon: Leandrew Koyanagi, MD;  Location: [facility];  Service: Orthopedics;  Laterality: Right;   LUMBAR DISC SURGERY  1992   POLYPECTOMY     POSTERIOR LUMBAR FUSION  1996   TOE SURGERY Right 1980s   bone removed "inbetween my toes"   TOTAL HIP ARTHROPLASTY Left 11/16/2018   Procedure: LEFT TOTAL HIP ARTHROPLASTY ANTERIOR APPROACH;  Surgeon: Leandrew Koyanagi, MD;  Location: [facility];  Service: Orthopedics;  Laterality: Left;   TOTAL HIP ARTHROPLASTY Right 07/23/2021   Procedure: RIGHT TOTAL HIP REPLACEMENT;  Surgeon: Leandrew Koyanagi, MD;  Location: [facility];  Service: Orthopedics;  Laterality: Right;   VAGINAL HYSTERECTOMY     Family History  Problem  Relation Age of Onset   Cancer Mother        breast   Depression Mother        bipolar   Breast cancer Mother 65   Heart disease Father 65       MI   Diabetes Brother    Kidney disease Brother    Hypertension Brother    Hyperlipidemia Brother    Stroke Brother    Coronary artery disease Other    Diabetes Other    Colon cancer Neg Hx    Colon polyps Neg Hx    Esophageal cancer Neg Hx    Rectal cancer Neg Hx    Stomach cancer Neg Hx  Social History   Socioeconomic History   Marital status: Widowed    Spouse name: Not on file   Number of children: Not on file   Years of education: Not on file   Highest education level: Not on file  Occupational History   Not on file  Tobacco Use   Smoking status: Never   Smokeless tobacco: Never  Vaping Use   Vaping Use: Never used  Substance and Sexual Activity   Alcohol use: Never   Drug use: Never   Sexual activity: Not Currently    Partners: Male  Other Topics Concern   Not on file  Social History Narrative   Not on file   Social Determinants of Health   Financial Resource Strain: Low Risk  (01/25/2021)   Overall Financial Resource Strain (CARDIA)    Difficulty of Paying Living Expenses: Not hard at all  Food Insecurity: No Food Insecurity (03/26/2022)   Hunger Vital Sign    Worried About Running Out of Food in the Last Year: Never true    Bruni in the Last Year: Never true  Transportation Needs: No Transportation Needs (03/26/2022)   PRAPARE - Hydrologist (Medical): No    Lack of Transportation (Non-Medical): No  Physical Activity: Insufficiently Active (11/30/2021)   Exercise Vital Sign    Days of Exercise per Week: 3 days    Minutes of Exercise per Session: 20 min  Stress: No Stress Concern Present (03/26/2022)   Friendship    Feeling of Stress : Not at all  Social Connections: Moderately Isolated (03/26/2022)    Social Connection and Isolation Panel [NHANES]    Frequency of Communication with Friends and Family: More than three times a week    Frequency of Social Gatherings with Friends and Family: Twice a week    Attends Religious Services: More than 4 times per year    Active Member of [employer] or Organizations: No    Attends Archivist Meetings: Never    Marital Status: Widowed    Tobacco Counseling Counseling given: Not Answered   Clinical Intake:  Pre-visit preparation completed: Yes  Pain : No/denies pain  How often do you need to have someone help you when you read instructions, pamphlets, or other written materials from your doctor or pharmacy?: 1 - Never  Diabetic? Nutrition Risk Assessment:  Has the patient had any N/V/D within the last 2 months?  No  Does the patient have any non-healing wounds?  No  Has the patient had any unintentional weight loss or weight gain?  No   Diabetes:  Is the patient diabetic?  Yes  If diabetic, was a CBG obtained today?  No  Did the patient bring in their glucometer from home?  No  How often do you monitor your CBG's? Very rarely.   Financial Strains and Diabetes Management:  Are you having any financial strains with the device, your supplies or your medication? No .  Does the patient want to be seen by Chronic Care Management for management of their diabetes?  No  Would the patient like to be referred to a Nutritionist or for Diabetic Management?  No   Diabetic Exams:  Diabetic Eye Exam: Overdue for diabetic eye exam. Pt has been advised about the importance in completing this exam. Patient advised to call and schedule an eye exam. Diabetic Foot Exam: Overdue, Pt has been advised about the importance  in completing this exam. Pt is scheduled for diabetic foot exam on N/a.  Interpreter Needed?: No  Information entered by :: Beatris Ship, CMA   Activities of Daily Living    03/26/2022    2:31 PM 07/25/2021    1:00 AM  In  your present state of health, do you have any difficulty performing the following activities:  Hearing? 0 0  Vision? 0 0  Difficulty concentrating or making decisions? 1 0  Comment slight memory loss   Walking or climbing stairs?  1  Dressing or bathing? 0 0  Doing errands, shopping? 1 0  Comment friend drives her   Preparing Food and eating ? N   Using the Toilet? N   In the past six months, have you accidently leaked urine? N   Do you have problems with loss of bowel control? N   Managing your Medications? N   Managing your Finances? N   Housekeeping or managing your Housekeeping? N     Patient Care Team: Carollee Herter, Alferd Apa, DO as PCP - General Nicholaus Bloom, MD as Consulting Physician (Neurology) Milus Banister, MD as Attending Physician (Gastroenterology) Leandrew Koyanagi, MD as Attending Physician (Orthopedic Surgery) Cherre Robins, RPH-CPP (Pharmacist)  Indicate any recent Medical Services you may have received from other than [facility] providers in the past year (date may be approximate).     Assessment:   This is a routine wellness examination for Myrtie.  Hearing/Vision screen No results found.  Dietary issues and exercise activities discussed: Current Exercise Habits: The patient does not participate in regular exercise at present, Exercise limited by: orthopedic condition(s)   Goals Addressed   None    Depression Screen    03/26/2022    2:30 PM 11/30/2021   11:36 AM 11/30/2021   11:35 AM 11/30/2021   11:34 AM 01/29/2021   10:55 AM 01/25/2021    2:34 PM 10/26/2020    1:33 PM  PHQ 2/9 Scores  PHQ - 2 Score 0 0 0 0 _0 PHQ- 9 Score     10  9    Fall Risk    03/26/2022    2:23 PM 11/19/2021   10:14 AM 01/25/2021    2:33 PM 10/26/2019    2:10 PM 06/13/2017   11:18 AM  Fall Risk   Falls in the past year? _1 Yes  Number falls in past yr: _2 or more  Comment   impaired balance    Injury with Fall? 0 1 0 1 No  Risk for fall due to : History of  fall(s);Impaired balance/gait History of fall(s);Impaired balance/gait Impaired balance/gait History of fall(s) Impaired mobility  Follow up Falls evaluation completed Falls evaluation completed Falls prevention discussed Falls evaluation completed Falls prevention discussed    FALL RISK PREVENTION PERTAINING TO THE HOME:  Any stairs in or around the home? No  If so, are there any without handrails? No  Home free of loose throw rugs in walkways, pet beds, electrical cords, etc? Yes  Adequate lighting in your home to reduce risk of falls? Yes   ASSISTIVE DEVICES UTILIZED TO PREVENT FALLS:  Life alert? Yes  Use of a cane, walker or w/c? Yes  Grab bars in the bathroom? Yes  Shower chair or bench in shower? Yes  Elevated toilet seat or a handicapped toilet? Yes   TIMED UP AND GO:  Was the test performed?  No, audio visit .  Cognitive Function:    02/19/2016    2:39 PM 12/07/2014   11:44 AM  MMSE - Mini Mental State Exam  Orientation to time 5 5  Orientation to Place 5 5  Registration 3 3  Attention/ Calculation 5 5  Recall 3 3  Language- name 2 objects 2 2  Language- repeat 1 1  Language- follow 3 step command 3 3  Language- read & follow direction 1 1  Write a sentence 1 1  Copy design 1 1  Total score 30 30        03/26/2022    2:43 PM  6CIT Screen  What Year? 0 points  What month? 0 points  What time? 0 points  Count back from 20 0 points  Months in reverse 0 points  Repeat phrase 2 points  Total Score 2 points    Immunizations Immunization History  Administered Date(s) Administered   DTP 07/14/2002   Fluad Quad(high Dose 65+) 12/27/2019   Influenza Whole 02/12/2007   Influenza, High Dose Seasonal PF 02/11/2014, 02/10/2015, 02/10/2015, 03/26/2018, 03/11/2019, 12/27/2019   Influenza,inj,Quad PF,6+ Mos 05/14/2013, 02/19/2016   PFIZER Comirnaty(Gray Top)Covid-19 Tri-Sucrose Vaccine 05/31/2019, 06/21/2019, 02/01/2020   Pneumococcal Conjugate-13 05/04/2015    Pneumococcal Polysaccharide-23 05/14/2013, 03/26/2018   Tdap 03/26/2018   Zoster, Live 01/02/2010    TDAP status: Up to date  Flu Vaccine status: Up to date  Pneumococcal vaccine status: Up to date  Covid-19 vaccine status: Information provided on how to obtain vaccines.   Qualifies for Shingles Vaccine? Yes   Zostavax completed Yes   Shingrix Completed?: No.    Education has been provided regarding the importance of this vaccine. Patient has been advised to call insurance company to determine out of pocket expense if they have not yet received this vaccine. Advised may also receive vaccine at local pharmacy or Health Dept. Verbalized acceptance and understanding.  Screening Tests Health Maintenance  Topic Date Due   Zoster Vaccines- Shingrix (1 of 2) Never done   MAMMOGRAM  05/01/2018   COLONOSCOPY (Pts 45-56yr Insurance coverage will need to be confirmed)  06/11/2020   FOOT EXAM  06/24/2020   OPHTHALMOLOGY EXAM  10/27/2020   COVID-19 Vaccine (4 - 2023-24 season) 12/07/2021   Medicare Annual Wellness (AWV)  01/25/2022   INFLUENZA VACCINE  07/07/2022 (Originally 11/06/2021)   HEMOGLOBIN A1C  09/11/2022   Diabetic kidney evaluation - Urine ACR  12/07/2022   Diabetic kidney evaluation - eGFR measurement  03/13/2023   DTaP/Tdap/Td (3 - Td or Tdap) 03/26/2028   Pneumonia Vaccine 76 Years old  Completed   DEXA SCAN  Completed   Hepatitis C Screening  Completed   HPV VACCINES  Aged Out    Health Maintenance  Health Maintenance Due  Topic Date Due   Zoster Vaccines- Shingrix (1 of 2) Never done   MAMMOGRAM  05/01/2018   COLONOSCOPY (Pts 45-465yrInsurance coverage will need to be confirmed)  06/11/2020   FOOT EXAM  06/24/2020   OPHTHALMOLOGY EXAM  10/27/2020   COVID-19 Vaccine (4 - 2023-24 season) 12/07/2021   Medicare Annual Wellness (AWV)  01/25/2022    Colorectal cancer screening: Referral to GI placed 03/26/22. Pt aware the office will call re: appt.  Mammogram  status: Ordered 02/13/22. Pt provided with contact info and advised to call to schedule appt.   Bone Density status: Ordered 03/26/22. Pt provided with contact info and advised to call to schedule appt.  Lung Cancer Screening: (Low Dose CT Chest recommended if Age  55-80 years, 30 pack-year currently smoking OR have quit w/in 15years.) does not qualify.   Additional Screening:  Hepatitis C Screening: does qualify; Completed 05/08/15  Vision Screening: Recommended annual ophthalmology exams for early detection of glaucoma and other disorders of the eye. Is the patient up to date with their annual eye exam?   No, has appointment in May 14, 2022 Who is the provider or what is the name of the office in which the patient attends annual eye exams? [facility] If pt is not established with a provider, would they like to be referred to a provider to establish care? No .   Dental Screening: Recommended annual dental exams for proper oral hygiene  Community Resource Referral / Chronic Care Management: CRR required this visit?  No   CCM required this visit?  No      Plan:     I have personally reviewed and noted the following in the patient's chart:   Medical and social history Use of alcohol, tobacco or illicit drugs  Current medications and supplements including opioid prescriptions. Patient is currently taking opioid prescriptions. Information provided to patient regarding non-opioid alternatives. Patient advised to discuss non-opioid treatment plan with their provider. Functional ability and status Nutritional status Physical activity Advanced directives List of other physicians Hospitalizations, surgeries, and ER visits in previous 12 months Vitals Screenings to include cognitive, depression, and falls Referrals and appointments  In addition, I have reviewed and discussed with patient certain preventive protocols, quality metrics, and best practice recommendations. A  written personalized care plan for preventive services as well as general preventive health recommendations were provided to patient.   Due to this being a telephonic visit, the after visit summary with patients personalized plan was offered to patient via mail or my-chart. Per request, patient was mailed a copy of AVS.   Beatris Ship, [place]   03/26/2022   Nurse Notes: None

## 2022-03-26 NOTE — Patient Instructions (Signed)
Brittany [name] , Thank you for taking time to come for your Medicare Wellness Visit. I appreciate your ongoing commitment to your health goals. Please review the following plan we discussed and let me know if I can assist you in the future.   These are the goals we discussed:  Goals       Chronic Care Managment Pharmacy Care Plan      CARE PLAN ENTRY  Current Barriers:  Chronic Disease Management support, education, and care coordination needs related to Diabetes, Hypertension, Hyperlipidemia, Depression, Neuropathy/Pain, Muscle Spasm, GERD, Estrogen Deficiency   Hypertension Pharmacist Clinical Goal(s): Over the next 90 days, patient will work with PharmD and providers to maintain BP goal <140/90 BP Readings from Last 3 Encounters:  07/25/21 (!) 123/57  07/12/21 (!) 120/51  07/10/21 120/70  Current regimen:  Amlodipine '5mg'$  daily Bisoprolol-hydrochlorothiazide 10-6.'25mg'$  daily furosemide '20mg'$  every other day Intervention:  Reviewed refill history Patient self care activities - Over the next 90 days, patient will: Maintain hypertension medication regimen.  Check blood pressure once or twice per week, record and bring to future appointments.   Hyperlipidemia Pharmacist Clinical Goal(s): Over the next 90 days, patient will work with PharmD and providers to maintain LDL goal <70 Lipid Panel     Component Value Date/Time   CHOL 175 07/10/2021 1527   TRIG 84.0 07/10/2021 1527   HDL 81.60 07/10/2021 1527   CHOLHDL 2 07/10/2021 1527   VLDL 16.8 07/10/2021 1527   LDLCALC 76 07/10/2021 1527   LDLCALC 70 12/27/2019 1333   Current regimen:  Atorvastatin '10mg'$  daily Patient self care activities - Over the next 90 days, patient will: Maintain cholesterol medication regimen.   Diabetes Pharmacist Clinical Goal(s): Over the next 90 days, patient will work with PharmD and providers to maintain A1c goal <7% Lab Results  Component Value Date   HGBA1C 7.2 (H) 07/10/2021  Current  regimen:  Janumet 50-'1000mg'$  2 tablets daily at bedtime Farxiga '5mg'$  daily  Patient self care activities - Over the next 90 days, patient will: Maintain diabetes medication regimen Check blood glucose once daily, record and bring to future appointments Discussed increasing exercise - walking, Silver Sneakers at [facility] or on [employer] program recommended. Increase to goal of at least 150 minutes per week.  GERD / acid reflux Pharmacist Clinical Goal(s) Over the next 90 days, patient will work with PharmD and providers to reduce symptoms of GERD Current regimen:  Omeprazole '40mg'$  daily Tums (calcium carbonate) - take as needed for heart burn symptoms Patient self care activities - Over the next 90 days, patient will: Continue current medication for acid reflux.   Estrogen Deficiency Pharmacist Clinical Goal(s) Over the next 90 days, patient will work with PharmD and providers to reduce symptoms of estrogen deficiency and polypharmacy Current regimen:  Estradiol '1mg'$  daily Patient self care activities - Over the next 90 days, patient will: Schedule yearly mammogram   General Anxiety:  Pharmacist Clinical Goal(s) Over the next 90 days, patient will work with PharmD and providers to maintain control of anxiety / depression Current regimen:  Escitalopram '10mg'$  daily Interventions: Continue current medication therapy   Health Maintenance: Reminded to get annual eye exam Reminded to rescheduled appointments for mammogram   Medication management Pharmacist Clinical Goal(s): Over the next 90 days, patient will work with PharmD and providers to maintain optimal medication adherence Current pharmacy: [facility]  Interventions Comprehensive medication review performed. Continue current medication management strategy Medication list updated Patient self care activities -  Over the next 90 days, patient will: Focus on medication adherence by filling medications  appropriately  Take medications as prescribed Report any questions or concerns to PharmD and/or provider(s)  Patient Goals/Self-Care Activities Over the next 180 days, patient will:  take medications as prescribed Check glucose every other day, document, and provide at future appointments,  check blood pressure once or twice per week, document, and provide at future appointments, and  target a minimum of 150 minutes of moderate intensity exercise weekly Reschedule appointments for mammogram ([phone]) and annual eye exam  Please see past updates related to this goal by clicking on the "Past Updates" button in the selected goal          Increase physical activity      [facility]        Patient Stated      Would like to actively walk more.      Pt states she would like to sleep at least 7 hours of sleep per night.  (pt-stated)      Reduce sugar intake      Pt would like to decrease sweet intake- eating 1 sweet 3-4 days out of the week.          This is a list of the screening recommended for you and due dates:  Health Maintenance  Topic Date Due   Zoster (Shingles) Vaccine (1 of 2) Never done   Mammogram  05/01/2018   Colon Cancer Screening  06/11/2020   Complete foot exam   06/24/2020   Eye exam for diabetics  10/27/2020   COVID-19 Vaccine (4 - 2023-24 season) 12/07/2021   Flu Shot  07/07/2022*   Hemoglobin A1C  09/11/2022   Yearly kidney health urinalysis for diabetes  12/07/2022   Yearly kidney function blood test for diabetes  03/13/2023   Medicare Annual Wellness Visit  03/27/2023   DTaP/Tdap/Td vaccine (3 - Td or Tdap) 03/26/2028   Pneumonia Vaccine  Completed   DEXA scan (bone density measurement)  Completed   Hepatitis C Screening: USPSTF Recommendation to screen - Ages 74-79 yo.  Completed   HPV Vaccine  Aged Out  *Topic was postponed. The date shown is not the original due date.     Next appointment: Follow up in one year for your annual  wellness visit    Preventive Care 65 Years and Older, Female Preventive care refers to lifestyle choices and visits with your health care provider that can promote health and wellness. What does preventive care include? A yearly physical exam. This is also called an annual well check. Dental exams once or twice a year. Routine eye exams. Ask your health care provider how often you should have your eyes checked. Personal lifestyle choices, including: Daily care of your teeth and gums. Regular physical activity. Eating a healthy diet. Avoiding tobacco and drug use. Limiting alcohol use. Practicing safe sex. Taking low-dose aspirin every day. Taking vitamin and mineral supplements as recommended by your health care provider. What happens during an annual well check? The services and screenings done by your health care provider during your annual well check will depend on your age, overall health, lifestyle risk factors, and family history of disease. Counseling  Your health care provider may ask you questions about your: Alcohol use. Tobacco use. Drug use. Emotional well-being. Home and relationship well-being. Sexual activity. Eating habits. History of falls. Memory and ability to understand (cognition). Work and work Statistician. Reproductive health. Screening  You may  have the following tests or measurements: Height, weight, and BMI. Blood pressure. Lipid and cholesterol levels. These may be checked every 5 years, or more frequently if you are over 70 years old. Skin check. Lung cancer screening. You may have this screening every year starting at age 108 if you have a 30-pack-year history of smoking and currently smoke or have quit within the past 15 years. Fecal occult blood test (FOBT) of the stool. You may have this test every year starting at age 89. Flexible sigmoidoscopy or colonoscopy. You may have a sigmoidoscopy every 5 years or a colonoscopy every 10 years starting  at age 80. Hepatitis C blood test. Hepatitis B blood test. Sexually transmitted disease (STD) testing. Diabetes screening. This is done by checking your blood sugar (glucose) after you have not eaten for a while (fasting). You may have this done every 1-3 years. Bone density scan. This is done to screen for osteoporosis. You may have this done starting at age 80. Mammogram. This may be done every 1-2 years. Talk to your health care provider about how often you should have regular mammograms. Talk with your health care provider about your test results, treatment options, and if necessary, the need for more tests. Vaccines  Your health care provider may recommend certain vaccines, such as: Influenza vaccine. This is recommended every year. Tetanus, diphtheria, and acellular pertussis (Tdap, Td) vaccine. You may need a Td booster every 10 years. Zoster vaccine. You may need this after age 87. Pneumococcal 13-valent conjugate (PCV13) vaccine. One dose is recommended after age 3. Pneumococcal polysaccharide (PPSV23) vaccine. One dose is recommended after age 9. Talk to your health care provider about which screenings and vaccines you need and how often you need them. This information is not intended to replace advice given to you by your health care provider. Make sure you discuss any questions you have with your health care provider. Document Released: 04/21/2015 Document Revised: 12/13/2015 Document Reviewed: 01/24/2015 Elsevier Interactive Patient Education  2017 [place] Prevention in the Home Falls can cause injuries. They can happen to people of all ages. There are many things you can do to make your home safe and to help prevent falls. What can I do on the outside of my home? Regularly fix the edges of walkways and driveways and fix any cracks. Remove anything that might make you trip as you walk through a door, such as a raised step or threshold. Trim any bushes or trees on  the path to your home. Use bright outdoor lighting. Clear any walking paths of anything that might make someone trip, such as rocks or tools. Regularly check to see if handrails are loose or broken. Make sure that both sides of any steps have handrails. Any raised decks and porches should have guardrails on the edges. Have any leaves, snow, or ice cleared regularly. Use sand or salt on walking paths during winter. Clean up any spills in your garage right away. This includes oil or grease spills. What can I do in the bathroom? Use night lights. Install grab bars by the toilet and in the tub and shower. Do not use towel bars as grab bars. Use non-skid mats or decals in the tub or shower. If you need to sit down in the shower, use a plastic, non-slip stool. Keep the floor dry. Clean up any water that spills on the floor as soon as it happens. Remove soap buildup in the tub or shower regularly. Attach bath  mats securely with double-sided non-slip rug tape. Do not have throw rugs and other things on the floor that can make you trip. What can I do in the bedroom? Use night lights. Make sure that you have a light by your bed that is easy to reach. Do not use any sheets or blankets that are too big for your bed. They should not hang down onto the floor. Have a firm chair that has side arms. You can use this for support while you get dressed. Do not have throw rugs and other things on the floor that can make you trip. What can I do in the kitchen? Clean up any spills right away. Avoid walking on wet floors. Keep items that you use a lot in easy-to-reach places. If you need to reach something above you, use a strong step stool that has a grab bar. Keep electrical cords out of the way. Do not use floor polish or wax that makes floors slippery. If you must use wax, use non-skid floor wax. Do not have throw rugs and other things on the floor that can make you trip. What can I do with my stairs? Do  not leave any items on the stairs. Make sure that there are handrails on both sides of the stairs and use them. Fix handrails that are broken or loose. Make sure that handrails are as long as the stairways. Check any carpeting to make sure that it is firmly attached to the stairs. Fix any carpet that is loose or worn. Avoid having throw rugs at the top or bottom of the stairs. If you do have throw rugs, attach them to the floor with carpet tape. Make sure that you have a light switch at the top of the stairs and the bottom of the stairs. If you do not have them, ask someone to add them for you. What else can I do to help prevent falls? Wear shoes that: Do not have high heels. Have rubber bottoms. Are comfortable and fit you well. Are closed at the toe. Do not wear sandals. If you use a stepladder: Make sure that it is fully opened. Do not climb a closed stepladder. Make sure that both sides of the stepladder are locked into place. Ask someone to hold it for you, if possible. Clearly mark and make sure that you can see: Any grab bars or handrails. First and last steps. Where the edge of each step is. Use tools that help you move around (mobility aids) if they are needed. These include: Canes. Walkers. Scooters. Crutches. Turn on the lights when you go into a dark area. Replace any light bulbs as soon as they burn out. Set up your furniture so you have a clear path. Avoid moving your furniture around. If any of your floors are uneven, fix them. If there are any pets around you, be aware of where they are. Review your medicines with your doctor. Some medicines can make you feel dizzy. This can increase your chance of falling. Ask your doctor what other things that you can do to help prevent falls. This information is not intended to replace advice given to you by your health care provider. Make sure you discuss any questions you have with your health care provider. Document Released:  01/19/2009 Document Revised: 08/31/2015 Document Reviewed: 04/29/2014 Elsevier Interactive Patient Education  2017 [employer].

## 2022-04-16 ENCOUNTER — Other Ambulatory Visit (HOSPITAL_BASED_OUTPATIENT_CLINIC_OR_DEPARTMENT_OTHER): Payer: Medicare HMO

## 2022-04-16 ENCOUNTER — Inpatient Hospital Stay (HOSPITAL_BASED_OUTPATIENT_CLINIC_OR_DEPARTMENT_OTHER): Admission: RE | Admit: 2022-04-16 | Payer: Medicare HMO | Source: Ambulatory Visit

## 2022-04-17 DIAGNOSIS — G894 Chronic pain syndrome: Secondary | ICD-10-CM | POA: Diagnosis not present

## 2022-04-17 DIAGNOSIS — G4700 Insomnia, unspecified: Secondary | ICD-10-CM | POA: Diagnosis not present

## 2022-04-17 DIAGNOSIS — E1142 Type 2 diabetes mellitus with diabetic polyneuropathy: Secondary | ICD-10-CM | POA: Diagnosis not present

## 2022-04-17 DIAGNOSIS — Z79891 Long term (current) use of opiate analgesic: Secondary | ICD-10-CM | POA: Diagnosis not present

## 2022-04-23 ENCOUNTER — Ambulatory Visit (HOSPITAL_BASED_OUTPATIENT_CLINIC_OR_DEPARTMENT_OTHER): Payer: Medicare HMO

## 2022-04-23 ENCOUNTER — Other Ambulatory Visit (HOSPITAL_BASED_OUTPATIENT_CLINIC_OR_DEPARTMENT_OTHER): Payer: Medicare HMO

## 2022-04-29 ENCOUNTER — Telehealth (HOSPITAL_BASED_OUTPATIENT_CLINIC_OR_DEPARTMENT_OTHER): Payer: Self-pay

## 2022-04-30 ENCOUNTER — Ambulatory Visit (HOSPITAL_BASED_OUTPATIENT_CLINIC_OR_DEPARTMENT_OTHER): Payer: Medicare HMO

## 2022-04-30 ENCOUNTER — Other Ambulatory Visit (HOSPITAL_BASED_OUTPATIENT_CLINIC_OR_DEPARTMENT_OTHER): Payer: Medicare HMO

## 2022-05-09 ENCOUNTER — Encounter: Payer: Self-pay | Admitting: Family Medicine

## 2022-05-15 ENCOUNTER — Ambulatory Visit (AMBULATORY_SURGERY_CENTER): Payer: Medicare HMO

## 2022-05-15 ENCOUNTER — Encounter: Payer: Self-pay | Admitting: Gastroenterology

## 2022-05-15 ENCOUNTER — Telehealth: Payer: Self-pay

## 2022-05-15 VITALS — Ht 71.0 in | Wt 173.0 lb

## 2022-05-15 DIAGNOSIS — Z86010 Personal history of colonic polyps: Secondary | ICD-10-CM

## 2022-05-15 MED ORDER — SUTAB 1479-225-188 MG PO TABS: 12.0000 | ORAL_TABLET | ORAL | 0 refills | Status: DC

## 2022-05-15 NOTE — Telephone Encounter (Signed)
Left message for patient regarding Previsit appt. At 12:30.  Let her know I would call back in 5 minutes, but I needed to hear from her by 5pm today or colonoscopy would be cancelled.

## 2022-05-15 NOTE — Progress Notes (Signed)

## 2022-05-15 NOTE — Telephone Encounter (Signed)
Was able to reach patient and do pre visit.  Prep instructions mailed.

## 2022-05-21 ENCOUNTER — Encounter: Payer: Self-pay | Admitting: Family Medicine

## 2022-05-21 ENCOUNTER — Ambulatory Visit (INDEPENDENT_AMBULATORY_CARE_PROVIDER_SITE_OTHER): Payer: Medicare HMO | Admitting: Family Medicine

## 2022-05-21 VITALS — BP 120/70 | HR 62 | Temp 98.5°F | Resp 16 | Ht 71.0 in | Wt 189.2 lb

## 2022-05-21 DIAGNOSIS — F411 Generalized anxiety disorder: Secondary | ICD-10-CM | POA: Diagnosis not present

## 2022-05-21 DIAGNOSIS — Z89411 Acquired absence of right great toe: Secondary | ICD-10-CM | POA: Diagnosis not present

## 2022-05-21 DIAGNOSIS — E1169 Type 2 diabetes mellitus with other specified complication: Secondary | ICD-10-CM

## 2022-05-21 DIAGNOSIS — Z0000 Encounter for general adult medical examination without abnormal findings: Secondary | ICD-10-CM | POA: Diagnosis not present

## 2022-05-21 DIAGNOSIS — I10 Essential (primary) hypertension: Secondary | ICD-10-CM

## 2022-05-21 DIAGNOSIS — E1165 Type 2 diabetes mellitus with hyperglycemia: Secondary | ICD-10-CM | POA: Diagnosis not present

## 2022-05-21 DIAGNOSIS — E785 Hyperlipidemia, unspecified: Secondary | ICD-10-CM

## 2022-05-21 DIAGNOSIS — R69 Illness, unspecified: Secondary | ICD-10-CM | POA: Diagnosis not present

## 2022-05-21 NOTE — Assessment & Plan Note (Signed)
hgba1c acceptable, minimize simple carbs. Increase exercise as tolerated. Continue current meds 

## 2022-05-21 NOTE — Assessment & Plan Note (Signed)
Well controlled, no changes to meds. Encouraged heart healthy diet such as the DASH diet and exercise as tolerated.  

## 2022-05-21 NOTE — Assessment & Plan Note (Signed)
Ghm utd Check labs  See avs

## 2022-05-21 NOTE — Assessment & Plan Note (Signed)
stable °

## 2022-05-21 NOTE — Assessment & Plan Note (Signed)
Tolerating statin, encouraged heart healthy diet, avoid trans fats, minimize simple carbs and saturated fats. Increase exercise as tolerated 

## 2022-05-21 NOTE — Progress Notes (Addendum)
Subjective:   By signing my name below, I, Brittany [name], attest that this documentation has been prepared under the direction and in the presence of Ann Held, DO. 05/21/2022.    Patient ID: Brittany [name], female    DOB: 01-25-1946, 77 y.o.   MRN: [id number]  Chief Complaint  Patient presents with   Annual Exam    Pt states fasting     HPI Patient is in today for a comprehensive physical exam.  Social history: She denies any surgeries within the past year. No new family medical history.  Colonoscopy: Last completed 06/12/2010. She states she has an appointment with Dr. Fuller Plan in GI on 2/22.  Dexa: Last completed 03/11/2016.  Mammogram: She is scheduled for a mammogram 05/28/2022. Last completed 05/01/2017 (Bilateral), 05/13/2017 (Left).  Immunizations: Influenza vaccine last received 03/06/2020. COVID vaccine last received 02/01/2020. She is UTD on her pneumonia vaccine (03/26/2018) and Tdap (03/26/2018). She confirms that she has received the shingles vaccine as well.  Diet: She reports her blood sugars have been stable in the 140s on average. Lab Results  Component Value Date   HGBA1C 6.6 (H) 03/12/2022   Dental: She is following with her dentist.  Vision: She complains of blurry vision, making it difficult to read. She believes she has dry eyes. For treatment she has tried eye drops such as Systane. Later this month she is scheduled to follow-up with her ophthalmologist.  Toe Amputation: She has an amputation of her right 1st toe. She notes that she has issues with walking backwards which has caused a few falls previously. Of note, she has no sensational feeling in the bottoms of her feet. She continues to follow with orthopedics.  Denies having any fever, new muscle pain, joint pain, new moles, congestion, sinus pain, sore throat, chest pain, palpitations, cough, SOB, wheezing, n/v/d, constipation, blood in stool, dysuria, frequency, hematuria, at this time.  Past  Medical History:  Diagnosis Date   Anemia    as a younger woman   Anxiety    Arthritis    "all over my body" (10/01/2017)   Chronic pain    In pain clinic    Constipation    Depression    Diabetic peripheral neuropathy (HCC)    GERD (gastroesophageal reflux disease)    High cholesterol    History of hiatal hernia    Hypertension    Type II diabetes mellitus ([place]) dx'd ~ 2008    Past Surgical History:  Procedure Laterality Date   AMPUTATION Right 09/29/2017   Procedure: AMPUTATION RIGHT 1ST RAY;  Surgeon: Leandrew Koyanagi, MD;  Location: [facility];  Service: Orthopedics;  Laterality: Right;   AMPUTATION Right 01/29/2019   Procedure: RIGHT TRANSMETATARSAL AMPUTATION;  Surgeon: Newt Minion, MD;  Location: [facility];  Service: Orthopedics;  Laterality: Right;   AMPUTATION TOE Right 04/24/2018   Procedure: RIGHT 2ND TOE PROXIMAL INTERPHALANGEAL JOINT DISARTICULATION;  Surgeon: Leandrew Koyanagi, MD;  Location: [facility];  Service: Orthopedics;  Laterality: Right;   APPLICATION OF WOUND VAC Right 09/29/2017   "foot"   BLADDER SUSPENSION  07/13/2020   Dr Zigmund Daniel    COLONOSCOPY     I & D EXTREMITY Right 08/10/2017   Procedure: IRRIGATION AND DEBRIDEMENT FOOT;  Surgeon: Leandrew Koyanagi, MD;  Location: [facility];  Service: Orthopedics;  Laterality: Right;   [place]   TOE SURGERY  Right 1980s   bone removed "inbetween my toes"   TOTAL HIP ARTHROPLASTY Left 11/16/2018   Procedure: LEFT TOTAL HIP ARTHROPLASTY ANTERIOR APPROACH;  Surgeon: Leandrew Koyanagi, MD;  Location: [facility];  Service: Orthopedics;  Laterality: Left;   TOTAL HIP ARTHROPLASTY Right 07/23/2021   Procedure: RIGHT TOTAL HIP REPLACEMENT;  Surgeon: Leandrew Koyanagi, MD;  Location: [facility];  Service: Orthopedics;  Laterality: Right;   VAGINAL HYSTERECTOMY      Family History  Problem Relation Age of Onset   Cancer Mother        breast   Depression Mother         bipolar   Breast cancer Mother 58   Heart disease Father 81       MI   Diabetes Brother    Kidney disease Brother    Hypertension Brother    Hyperlipidemia Brother    Stroke Brother    Coronary artery disease Other    Diabetes Other    Colon cancer Neg Hx    Colon polyps Neg Hx    Esophageal cancer Neg Hx    Rectal cancer Neg Hx    Stomach cancer Neg Hx     Social History   Socioeconomic History   Marital status: Widowed    Spouse name: Not on file   Number of children: Not on file   Years of education: Not on file   Highest education level: Not on file  Occupational History   Not on file  Tobacco Use   Smoking status: Never   Smokeless tobacco: Never  Vaping Use   Vaping Use: Never used  Substance and Sexual Activity   Alcohol use: Never   Drug use: Never   Sexual activity: Not Currently    Partners: Male  Other Topics Concern   Not on file  Social History Narrative   Not on file   Social Determinants of Health   Financial Resource Strain: Low Risk  (01/25/2021)   Overall Financial Resource Strain (CARDIA)    Difficulty of Paying Living Expenses: Not hard at all  Food Insecurity: No Food Insecurity (03/26/2022)   Hunger Vital Sign    Worried About Running Out of Food in the Last Year: Never true    [place] in the Last Year: Never true  Transportation Needs: No Transportation Needs (03/26/2022)   PRAPARE - Hydrologist (Medical): No    Lack of Transportation (Non-Medical): No  Physical Activity: Insufficiently Active (11/30/2021)   Exercise Vital Sign    Days of Exercise per Week: 3 days    Minutes of Exercise per Session: 20 min  Stress: No Stress Concern Present (03/26/2022)   [place]    Feeling of Stress : Not at all  Social Connections: Moderately Isolated (03/26/2022)   Social Connection and Isolation Panel [NHANES]    Frequency of Communication  with Friends and Family: More than three times a week    Frequency of Social Gatherings with Friends and Family: Twice a week    Attends Religious Services: More than 4 times per year    Active Member of [employer] or Organizations: No    Attends Archivist Meetings: Never    Marital Status: Widowed  Intimate Partner Violence: Not At Risk (03/26/2022)   Humiliation, Afraid, Rape, and Kick questionnaire    Fear of Current or Ex-Partner: No    Emotionally Abused: No  Physically Abused: No    Sexually Abused: No    Outpatient Medications Prior to Visit  Medication Sig Dispense Refill   amLODipine (NORVASC) 5 MG tablet Take 1 tablet (5 mg total) by mouth daily. 90 tablet 1   aspirin EC 81 MG tablet Take 1 tablet (81 mg total) by mouth 2 (two) times daily. To be taken after surgery to prevent blood clots 84 tablet 0   atorvastatin (LIPITOR) 10 MG tablet Take 1 tablet (10 mg total) by mouth daily. 90 tablet 1   bisoprolol-hydrochlorothiazide (ZIAC) 10-6.25 MG tablet Take 1 tablet by mouth daily. 90 tablet 1   Blood Glucose Monitoring Suppl (ONE TOUCH ULTRA 2) w/Device KIT Use to check blood glucose daily (Dx: E11.9 - type 2 DM) 1 kit 0   calcium carbonate (TUMS - DOSED IN MG ELEMENTAL CALCIUM) 500 MG chewable tablet Chew 2 tablets by mouth daily as needed for indigestion or heartburn.     Carboxymethylcellul-Glycerin (LUBRICATING EYE DROPS OP) Place 1 drop into both eyes daily as needed (dry eyes).     dapagliflozin propanediol (FARXIGA) 10 MG TABS tablet Take 1 tablet (10 mg total) by mouth daily before breakfast. 90 tablet 1   docusate sodium (COLACE) 100 MG capsule Take 1 capsule (100 mg total) by mouth daily as needed. (Patient taking differently: Take 100 mg by mouth 2 (two) times daily as needed.) 30 capsule 2   doxepin (SINEQUAN) 10 MG capsule Take 10 mg by mouth at bedtime as needed.     escitalopram (LEXAPRO) 10 MG tablet Take 1 tablet (10 mg total) by mouth daily. 90 tablet 1    estradiol (ESTRACE) 1 MG tablet Take 1 tablet (1 mg total) by mouth daily. 90 tablet 1   furosemide (LASIX) 20 MG tablet Take 1 tablet (20 mg total) by mouth daily. 90 tablet 1   gabapentin (NEURONTIN) 600 MG tablet Take 600 mg by mouth 4 (four) times daily.     glucose blood (ONE TOUCH ULTRA TEST) test strip Check Blood sugar once daily 100 each 5   KLOR-CON M20 20 MEQ tablet Take 1 tablet (20 mEq total) by mouth daily. 90 tablet 1   morphine (MS CONTIN) 30 MG 12 hr tablet Take 30 mg by mouth every 12 (twelve) hours. Per Dr. Nicholaus Bloom with Pain Mg[facility]  0   Multiple Vitamin (MULTIVITAMIN) capsule Take 1 capsule by mouth daily.     omeprazole (PRILOSEC) 40 MG capsule Take 1 capsule (40 mg total) by mouth daily. 90 capsule 1   OneTouch Delica Lancets 02V MISC Check blood sugar once daily 100 each 5   sitaGLIPtin-metformin (JANUMET) 50-1000 MG tablet Take 2 tablets by mouth at bedtime. 180 tablet 1   Sodium Sulfate-Mag Sulfate-KCl (SUTAB) 4036514159 MG TABS Take 12 tablets by mouth as directed. 24 tablet 0   Specialty Vitamins Products (BIOTIN PLUS KERATIN PO) Take 1 capsule by mouth daily.     tiZANidine (ZANAFLEX) 4 MG tablet Take 1 tablet (4 mg total) by mouth 3 (three) times daily as needed for muscle spasms. 270 tablet 0   VITAMIN D PO Take 1,000 Units by mouth daily.     No facility-administered medications prior to visit.    Allergies  Allergen Reactions   Sulfonamide Derivatives Hives    Review of Systems  Constitutional:  Negative for fever and malaise/fatigue.  HENT:  Negative for congestion, sinus pain and sore throat.   Eyes:  Positive for blurred vision.  Dry eyes.  Respiratory:  Negative for cough, shortness of breath and wheezing.   Cardiovascular:  Negative for chest pain, palpitations and leg swelling.  Gastrointestinal:  Negative for blood in stool, constipation, diarrhea, nausea and vomiting.  Genitourinary:  Negative for dysuria, frequency and hematuria.   Musculoskeletal:  Negative for back pain, joint pain and myalgias.  Skin:  Negative for rash.  Neurological:  Negative for loss of consciousness and headaches.       Objective:    Physical Exam Vitals and nursing note reviewed.  Constitutional:      Appearance: Normal appearance. She is well-developed.  HENT:     Head: Normocephalic and atraumatic.     Right Ear: Tympanic membrane, ear canal and external ear normal.     Left Ear: Tympanic membrane, ear canal and external ear normal.  Eyes:     Extraocular Movements: Extraocular movements intact.     Conjunctiva/sclera: Conjunctivae normal.     Pupils: Pupils are equal, round, and reactive to light.  Neck:     Thyroid: No thyromegaly.     Vascular: No carotid bruit or JVD.  Cardiovascular:     Rate and Rhythm: Normal rate and regular rhythm.     Heart sounds: Normal heart sounds. No murmur heard.    No gallop.  Pulmonary:     Effort: Pulmonary effort is normal. No respiratory distress.     Breath sounds: Normal breath sounds. No wheezing or rales.  Chest:     Chest wall: No tenderness.  Abdominal:     General: Bowel sounds are normal. There is no distension.     Palpations: Abdomen is soft.     Tenderness: There is no abdominal tenderness. There is no guarding.  Musculoskeletal:        General: Normal range of motion.     Cervical back: Normal range of motion and neck supple.  Feet:     Comments: Amputation of right big toe. Skin:    General: Skin is warm and dry.  Neurological:     General: No focal deficit present.     Mental Status: She is alert and oriented to person, place, and time.  Psychiatric:        Mood and Affect: Mood normal.        Behavior: Behavior normal.     BP 120/70 (BP Location: Right Arm, Patient Position: Sitting, Cuff Size: Normal)   Pulse 62   Temp 98.5 F (36.9 C) (Oral)   Resp 16   Ht '5\' 11"'$  (1.803 m)   Wt 189 lb 3.2 oz (85.8 kg)   SpO2 95%   BMI 26.39 kg/m  Wt Readings from  Last 3 Encounters:  05/21/22 189 lb 3.2 oz (85.8 kg)  05/15/22 173 lb (78.5 kg)  11/19/21 182 lb 12.8 oz (82.9 kg)    Diabetic Foot Exam - Simple   No data filed    Lab Results  Component Value Date   WBC 8.1 12/06/2021   HGB 11.4 (L) 12/06/2021   HCT 36.7 12/06/2021   PLT 255.0 12/06/2021   GLUCOSE 104 (H) 03/12/2022   CHOL 180 03/12/2022   TRIG 80.0 03/12/2022   HDL 90.80 03/12/2022   LDLCALC 73 03/12/2022   ALT 14 03/12/2022   AST 17 03/12/2022   NA 140 03/12/2022   K 4.1 03/12/2022   CL 99 03/12/2022   CREATININE 0.83 03/12/2022   BUN 13 03/12/2022   CO2 34 (H) 03/12/2022   TSH 1.39 05/15/2017  INR 1.0 11/12/2018   HGBA1C 6.6 (H) 03/12/2022   MICROALBUR 0.8 12/06/2021    Lab Results  Component Value Date   TSH 1.39 05/15/2017   Lab Results  Component Value Date   WBC 8.1 12/06/2021   HGB 11.4 (L) 12/06/2021   HCT 36.7 12/06/2021   MCV 74.0 (L) 12/06/2021   PLT 255.0 12/06/2021   Lab Results  Component Value Date   NA 140 03/12/2022   K 4.1 03/12/2022   CO2 34 (H) 03/12/2022   GLUCOSE 104 (H) 03/12/2022   BUN 13 03/12/2022   CREATININE 0.83 03/12/2022   BILITOT 0.3 03/12/2022   ALKPHOS 51 03/12/2022   AST 17 03/12/2022   ALT 14 03/12/2022   PROT 7.1 03/12/2022   ALBUMIN 4.3 03/12/2022   CALCIUM 9.7 03/12/2022   ANIONGAP 6 07/24/2021   GFR 69.67 03/12/2022   Lab Results  Component Value Date   CHOL 180 03/12/2022   Lab Results  Component Value Date   HDL 90.80 03/12/2022   Lab Results  Component Value Date   LDLCALC 73 03/12/2022   Lab Results  Component Value Date   TRIG 80.0 03/12/2022   Lab Results  Component Value Date   CHOLHDL 2 03/12/2022   Lab Results  Component Value Date   HGBA1C 6.6 (H) 03/12/2022       Assessment & Plan:   Problem List Items Addressed This Visit       Unprioritized   Preventative health care - Primary    Ghm utd Check labs  See avs      Relevant Orders   Lipid panel   CBC with  Differential/Platelet   Comprehensive metabolic panel   Hemoglobin A1c   Microalbumin / creatinine urine ratio   Hypertension    Well controlled, no changes to meds. Encouraged heart healthy diet such as the DASH diet and exercise as tolerated.        Relevant Orders   CBC with Differential/Platelet   Comprehensive metabolic panel   Hyperlipidemia associated with type 2 diabetes mellitus ([place])    Tolerating statin, encouraged heart healthy diet, avoid trans fats, minimize simple carbs and saturated fats. 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 Increase exercise as tolerated. Continue current meds       Other Visit Diagnoses     Uncontrolled type 2 diabetes mellitus with hyperglycemia ([place])       Relevant Orders   Comprehensive metabolic panel   Hemoglobin A1c   Microalbumin / creatinine urine ratio        No orders of the defined types were placed in this encounter.   IAnn Held, DO, personally preformed the services described in this documentation.  All medical record entries made by the scribe were at my direction and in my presence.  I have reviewed the chart and discharge instructions (if applicable) and agree that the record reflects my personal performance and is accurate and complete. 05/21/2012.  I,Mathew Stumpf,acting as a Education administrator for [employer], DO.,have documented all relevant documentation on the behalf of Ann Held, DO,as directed by  Ann Held, DO while in the presence of Ann Held, DO.   Ann Held, DO

## 2022-05-21 NOTE — Patient Instructions (Signed)
Preventive Care 65 Years and Older, Female Preventive care refers to lifestyle choices and visits with your health care provider that can promote health and wellness. Preventive care visits are also called wellness exams. What can I expect for my preventive care visit? Counseling Your health care provider may ask you questions about your: Medical history, including: Past medical problems. Family medical history. Pregnancy and menstrual history. History of falls. Current health, including: Memory and ability to understand (cognition). Emotional well-being. Home life and relationship well-being. Sexual activity and sexual health. Lifestyle, including: Alcohol, nicotine or tobacco, and drug use. Access to firearms. Diet, exercise, and sleep habits. Work and work environment. Sunscreen use. Safety issues such as seatbelt and bike helmet use. Physical exam Your health care provider will check your: Height and weight. These may be used to calculate your BMI (body mass index). BMI is a measurement that tells if you are at a healthy weight. Waist circumference. This measures the distance around your waistline. This measurement also tells if you are at a healthy weight and may help predict your risk of certain diseases, such as type 2 diabetes and high blood pressure. Heart rate and blood pressure. Body temperature. Skin for abnormal spots. What immunizations do I need?  Vaccines are usually given at various ages, according to a schedule. Your health care provider will recommend vaccines for you based on your age, medical history, and lifestyle or other factors, such as travel or where you work. What tests do I need? Screening Your health care provider may recommend screening tests for certain conditions. This may include: Lipid and cholesterol levels. Hepatitis C test. Hepatitis B test. HIV (human immunodeficiency virus) test. STI (sexually transmitted infection) testing, if you are at  risk. Lung cancer screening. Colorectal cancer screening. Diabetes screening. This is done by checking your blood sugar (glucose) after you have not eaten for a while (fasting). Mammogram. Talk with your health care provider about how often you should have regular mammograms. BRCA-related cancer screening. This may be done if you have a family history of breast, ovarian, tubal, or peritoneal cancers. Bone density scan. This is done to screen for osteoporosis. Talk with your health care provider about your test results, treatment options, and if necessary, the need for more tests. Follow these instructions at home: Eating and drinking  Eat a diet that includes fresh fruits and vegetables, whole grains, lean protein, and low-fat dairy products. Limit your intake of foods with high amounts of sugar, saturated fats, and salt. Take vitamin and mineral supplements as recommended by your health care provider. Do not drink alcohol if your health care provider tells you not to drink. If you drink alcohol: Limit how much you have to 0-1 drink a day. Know how much alcohol is in your drink. In the [place] one drink equals one 12 oz bottle of beer (355 mL), one 5 oz glass of wine (148 mL), or one 1 oz glass of hard liquor (44 mL). Lifestyle Brush your teeth every morning and night with fluoride toothpaste. Floss one time each day. Exercise for at least 30 minutes 5 or more days each week. Do not use any products that contain nicotine or tobacco. These products include cigarettes, chewing tobacco, and vaping devices, such as e-cigarettes. If you need help quitting, ask your health care provider. Do not use drugs. If you are sexually active, practice safe sex. Use a condom or other form of protection in order to prevent STIs. Take aspirin only as told by   your health care provider. Make sure that you understand how much to take and what form to take. Work with your health care provider to find out whether it  is safe and beneficial for you to take aspirin daily. Ask your health care provider if you need to take a cholesterol-lowering medicine (statin). Find healthy ways to manage stress, such as: Meditation, yoga, or listening to music. Journaling. Talking to a trusted person. Spending time with friends and family. Minimize exposure to UV radiation to reduce your risk of skin cancer. Safety Always wear your seat belt while driving or riding in a vehicle. Do not drive: If you have been drinking alcohol. Do not ride with someone who has been drinking. When you are tired or distracted. While texting. If you have been using any mind-altering substances or drugs. Wear a helmet and other protective equipment during sports activities. If you have firearms in your house, make sure you follow all gun safety procedures. What's next? Visit your health care provider once a year for an annual wellness visit. Ask your health care provider how often you should have your eyes and teeth checked. Stay up to date on all vaccines. This information is not intended to replace advice given to you by your health care provider. Make sure you discuss any questions you have with your health care provider. Document Revised: 09/20/2020 Document Reviewed: 09/20/2020 Elsevier Patient Education  2023 [employer]  

## 2022-05-21 NOTE — Assessment & Plan Note (Signed)
Stable. 

## 2022-05-22 ENCOUNTER — Other Ambulatory Visit: Payer: Self-pay | Admitting: Family Medicine

## 2022-05-22 DIAGNOSIS — E1165 Type 2 diabetes mellitus with hyperglycemia: Secondary | ICD-10-CM

## 2022-05-22 DIAGNOSIS — E1169 Type 2 diabetes mellitus with other specified complication: Secondary | ICD-10-CM

## 2022-05-22 LAB — CBC WITH DIFFERENTIAL/PLATELET
Basophils Absolute: 0 10*3/uL (ref 0.0–0.1)
Basophils Relative: 0.6 % (ref 0.0–3.0)
Eosinophils Absolute: 0.2 10*3/uL (ref 0.0–0.7)
Eosinophils Relative: 2.4 % (ref 0.0–5.0)
HCT: 40.1 % (ref 36.0–46.0)
Hemoglobin: 13 g/dL (ref 12.0–15.0)
Lymphocytes Relative: 27.5 % (ref 12.0–46.0)
Lymphs Abs: 2.1 10*3/uL (ref 0.7–4.0)
MCHC: 32.4 g/dL (ref 30.0–36.0)
MCV: 80.8 fl (ref 78.0–100.0)
Monocytes Absolute: 0.7 10*3/uL (ref 0.1–1.0)
Monocytes Relative: 9.8 % (ref 3.0–12.0)
Neutro Abs: 4.5 10*3/uL (ref 1.4–7.7)
Neutrophils Relative %: 59.7 % (ref 43.0–77.0)
Platelets: 228 10*3/uL (ref 150.0–400.0)
RBC: 4.96 Mil/uL (ref 3.87–5.11)
RDW: 17.5 % — ABNORMAL HIGH (ref 11.5–15.5)
WBC: 7.5 10*3/uL (ref 4.0–10.5)

## 2022-05-22 LAB — MICROALBUMIN / CREATININE URINE RATIO
Creatinine,U: 44.1 mg/dL
Microalb Creat Ratio: 1.6 mg/g (ref 0.0–30.0)
Microalb, Ur: <0.7 mg/dL (ref 0.0–1.9)

## 2022-05-22 LAB — COMPREHENSIVE METABOLIC PANEL
ALT: 15 U/L (ref 0–35)
AST: 16 U/L (ref 0–37)
Albumin: 3.9 g/dL (ref 3.5–5.2)
Alkaline Phosphatase: 55 U/L (ref 39–117)
BUN: 15 mg/dL (ref 6–23)
CO2: 31 mEq/L (ref 19–32)
Calcium: 9.2 mg/dL (ref 8.4–10.5)
Chloride: 101 mEq/L (ref 96–112)
Creatinine, Ser: 0.85 mg/dL (ref 0.40–1.20)
GFR: 67.61 mL/min (ref >60.00)
Glucose, Bld: 105 mg/dL — ABNORMAL HIGH (ref 70–99)
Potassium: 4.1 mEq/L (ref 3.5–5.1)
Sodium: 140 mEq/L (ref 135–145)
Total Bilirubin: 0.2 mg/dL (ref 0.2–1.2)
Total Protein: 6.7 g/dL (ref 6.0–8.3)

## 2022-05-22 LAB — LIPID PANEL
Cholesterol: 173 mg/dL (ref 0–200)
HDL: 87.7 mg/dL (ref >39.00)
LDL Cholesterol: 67 mg/dL (ref 0–99)
NonHDL: 85.33
Total CHOL/HDL Ratio: 2
Triglycerides: 92 mg/dL (ref 0.0–149.0)
VLDL: 18.4 mg/dL (ref 0.0–40.0)

## 2022-05-22 LAB — HEMOGLOBIN A1C: Hgb A1c MFr Bld: 7 % — ABNORMAL HIGH (ref 4.6–6.5)

## 2022-05-23 ENCOUNTER — Encounter: Payer: Medicare HMO | Admitting: Family Medicine

## 2022-05-23 ENCOUNTER — Encounter: Payer: Medicare HMO | Admitting: Gastroenterology

## 2022-05-28 ENCOUNTER — Ambulatory Visit (HOSPITAL_BASED_OUTPATIENT_CLINIC_OR_DEPARTMENT_OTHER): Payer: Medicare HMO

## 2022-05-28 ENCOUNTER — Other Ambulatory Visit (HOSPITAL_BASED_OUTPATIENT_CLINIC_OR_DEPARTMENT_OTHER): Payer: Medicare HMO

## 2022-05-28 ENCOUNTER — Ambulatory Visit: Payer: Medicare HMO | Admitting: Orthopaedic Surgery

## 2022-05-30 ENCOUNTER — Encounter: Payer: Self-pay | Admitting: Gastroenterology

## 2022-05-30 ENCOUNTER — Ambulatory Visit (AMBULATORY_SURGERY_CENTER): Payer: Medicare HMO | Admitting: Gastroenterology

## 2022-05-30 VITALS — BP 128/54 | HR 60 | Temp 97.7°F | Resp 12 | Ht 71.0 in | Wt 173.0 lb

## 2022-05-30 DIAGNOSIS — Z09 Encounter for follow-up examination after completed treatment for conditions other than malignant neoplasm: Secondary | ICD-10-CM

## 2022-05-30 DIAGNOSIS — I10 Essential (primary) hypertension: Secondary | ICD-10-CM | POA: Diagnosis not present

## 2022-05-30 DIAGNOSIS — Z86010 Personal history of colonic polyps: Secondary | ICD-10-CM | POA: Diagnosis not present

## 2022-05-30 DIAGNOSIS — D123 Benign neoplasm of transverse colon: Secondary | ICD-10-CM | POA: Diagnosis not present

## 2022-05-30 DIAGNOSIS — E119 Type 2 diabetes mellitus without complications: Secondary | ICD-10-CM | POA: Diagnosis not present

## 2022-05-30 DIAGNOSIS — D122 Benign neoplasm of ascending colon: Secondary | ICD-10-CM | POA: Diagnosis not present

## 2022-05-30 DIAGNOSIS — R69 Illness, unspecified: Secondary | ICD-10-CM | POA: Diagnosis not present

## 2022-05-30 DIAGNOSIS — K635 Polyp of colon: Secondary | ICD-10-CM | POA: Diagnosis not present

## 2022-05-30 MED ORDER — SODIUM CHLORIDE 0.9 % IV SOLN: 500.0000 mL | Freq: Once | INTRAVENOUS | Status: DC

## 2022-05-30 NOTE — Progress Notes (Deleted)
Pt's states no medical or surgical changes since previsit or office visit. 

## 2022-05-30 NOTE — Op Note (Signed)
[facility] Patient Name: Brittany [name] Procedure Date: 05/30/2022 10:12 AM MRN: [id number] Endoscopist: Ladene Artist , MD, [id number] Age: 77 Referring MD:  Date of Birth: 1945/07/01 Gender: Female Account #: [id number] Procedure:                Colonoscopy Indications:              Surveillance: Personal history of adenomatous                            polyps on last colonoscopy > 5 years ago Medicines:                Monitored Anesthesia Care Procedure:                Pre-Anesthesia Assessment:                           - Prior to the procedure, a History and Physical                            was performed, and patient medications and                            allergies were reviewed. The patient's tolerance of                            previous anesthesia was also reviewed. The risks                            and benefits of the procedure and the sedation                            options and risks were discussed with the patient.                            All questions were answered, and informed consent                            was obtained. Prior Anticoagulants: The patient has                            taken no anticoagulant or antiplatelet agents. ASA                            Grade Assessment: II - A patient with mild systemic                            disease. After reviewing the risks and benefits,                            the patient was deemed in satisfactory condition to                            undergo the procedure.  After obtaining informed consent, the colonoscope                            was passed under direct vision. Throughout the                            procedure, the patient's blood pressure, pulse, and                            oxygen saturations were monitored continuously. The                            Olympus CF-HQ190L [id number]) Colonoscope was                            introduced through the  anus and advanced to the the                            cecum, identified by appendiceal orifice and                            ileocecal valve. The ileocecal valve, appendiceal                            orifice, and rectum were photographed. The quality                            of the bowel preparation was good. The colonoscopy                            was performed without difficulty. The patient                            tolerated the procedure well. Scope In: 10:29:32 AM Scope Out: 10:46:38 AM Scope Withdrawal Time: 0 hours 13 minutes 13 seconds  Total Procedure Duration: 0 hours 17 minutes 6 seconds  Findings:                 Skin tags were found on perianal exam and two focal                            areas of dermatitis on buttocks.                           A 7 mm polyp was found in the ascending colon. The                            polyp was sessile. The polyp was removed with a                            cold snare. Resection and retrieval were complete.                           Two sessile polyps were found  in the transverse                            colon. The polyps were 4 mm in size. These polyps                            were removed with a cold biopsy forceps. Resection                            and retrieval were complete.                           The exam was otherwise without abnormality on                            direct and retroflexion views. Complications:            No immediate complications. Estimated blood loss:                            None. Estimated Blood Loss:     Estimated blood loss: none. Impression:               - Perianal skin tags found on perianal exam.                           - One sessile ascending colon polyp. Removed and                            retrieved.                           - Two sessile transverse colon polyps. Removed and                            retrieved. Recommendation:           - Repeat colonoscopy vs no  repeat due to age after                            studies are complete for surveillance based on                            pathology results with Dr. Ardis Hughs.                           - Patient has a contact number available for                            emergencies. The signs and symptoms of potential                            delayed complications were discussed with the                            patient. Return to normal activities tomorrow.  Written discharge instructions were provided to the                            patient.                           - Resume previous diet.                           - Continue present medications.                           - Await pathology results. Ladene Artist, MD 05/30/2022 10:51:52 AM This report has been signed electronically.

## 2022-05-30 NOTE — Progress Notes (Signed)
Pt resting comfortably. VSS. Airway intact. SBAR complete to RN. All questions answered.   

## 2022-05-30 NOTE — Patient Instructions (Signed)
Handout on polyps given to patient. Await pathology results. Resume previous diet and continue present medications. Repeat colonoscopy vs no repeat colonoscopy for surveillance due to age discussion with Dr. Ardis Hughs based off of pathology results.   YOU HAD AN ENDOSCOPIC PROCEDURE TODAY AT [facility]:   Refer to the procedure report that was given to you for any specific questions about what was found during the examination.  If the procedure report does not answer your questions, please call your gastroenterologist to clarify.  If you requested that your care partner not be given the details of your procedure findings, then the procedure report has been included in a sealed envelope for you to review at your convenience later.  YOU SHOULD EXPECT: Some feelings of bloating in the abdomen. Passage of more gas than usual.  Walking can help get rid of the air that was put into your GI tract during the procedure and reduce the bloating. If you had a lower endoscopy (such as a colonoscopy or flexible sigmoidoscopy) you may notice spotting of blood in your stool or on the toilet paper. If you underwent a bowel prep for your procedure, you may not have a normal bowel movement for a few days.  Please Note:  You might notice some irritation and congestion in your nose or some drainage.  This is from the oxygen used during your procedure.  There is no need for concern and it should clear up in a day or so.  SYMPTOMS TO REPORT IMMEDIATELY:  Following lower endoscopy (colonoscopy or flexible sigmoidoscopy):  Excessive amounts of blood in the stool  Significant tenderness or worsening of abdominal pains  Swelling of the abdomen that is new, acute  Fever of 100F or higher   For urgent or emergent issues, a gastroenterologist can be reached at any hour by calling [phone]. Do not use MyChart messaging for urgent concerns.    DIET:  We do recommend a small meal at first, but then you  may proceed to your regular diet.  Drink plenty of fluids but you should avoid alcoholic beverages for 24 hours.  ACTIVITY:  You should plan to take it easy for the rest of today and you should NOT DRIVE or use heavy machinery until tomorrow (because of the sedation medicines used during the test).    FOLLOW UP: Our staff will call the number listed on your records the next business day following your procedure.  We will call around 7:15- 8:00 am to check on you and address any questions or concerns that you may have regarding the information given to you following your procedure. If we do not reach you, we will leave a message.     If any biopsies were taken you will be contacted by phone or by letter within the next 1-3 weeks.  Please call us at [phone] if you have not heard about the biopsies in 3 weeks.    SIGNATURES/CONFIDENTIALITY: You and/or your care partner have signed paperwork which will be entered into your electronic medical record.  These signatures attest to the fact that that the information above on your After Visit Summary has been reviewed and is understood.  Full responsibility of the confidentiality of this discharge information lies with you and/or your care-partner.

## 2022-05-30 NOTE — Progress Notes (Signed)
History & Physical  Primary Care Physician:  Carollee Herter, Alferd Apa, DO Primary Gastroenterologist: Lucio Edward, MD  Impression / Plan:  Personal history of adenomatous colon polyps for surveillance colonoscopy.  CHIEF COMPLAINT:  Personal history of colon polyps   HPI: Brittany [name] is a 77 y.o. female with a personal history of adenomatous colon polyps for surveillance colonoscopy.   Past Medical History:  Diagnosis Date   Anemia    as a younger woman   Anxiety    Arthritis    "all over my body" (10/01/2017)   Chronic pain    In pain clinic    Constipation    Depression    Diabetic peripheral neuropathy (HCC)    GERD (gastroesophageal reflux disease)    High cholesterol    History of hiatal hernia    Hypertension    Type II diabetes mellitus ([place]) dx'd ~ 2008    Past Surgical History:  Procedure Laterality Date   AMPUTATION Right 09/29/2017   Procedure: AMPUTATION RIGHT 1ST RAY;  Surgeon: Leandrew Koyanagi, MD;  Location: [facility];  Service: Orthopedics;  Laterality: Right;   AMPUTATION Right 01/29/2019   Procedure: RIGHT TRANSMETATARSAL AMPUTATION;  Surgeon: Newt Minion, MD;  Location: [facility];  Service: Orthopedics;  Laterality: Right;   AMPUTATION TOE Right 04/24/2018   Procedure: RIGHT 2ND TOE PROXIMAL INTERPHALANGEAL JOINT DISARTICULATION;  Surgeon: Leandrew Koyanagi, MD;  Location: [facility];  Service: Orthopedics;  Laterality: Right;   APPLICATION OF WOUND VAC Right 09/29/2017   "foot"   BLADDER SUSPENSION  07/13/2020   Dr Zigmund Daniel    COLONOSCOPY     I & D EXTREMITY Right 08/10/2017   Procedure: IRRIGATION AND DEBRIDEMENT FOOT;  Surgeon: Leandrew Koyanagi, MD;  Location: [facility];  Service: Orthopedics;  Laterality: Right;   LUMBAR DISC SURGERY  1992   POLYPECTOMY     POSTERIOR LUMBAR FUSION  1996   TOE SURGERY Right 1980s   bone removed "inbetween my toes"   TOTAL HIP ARTHROPLASTY Left 11/16/2018   Procedure: LEFT TOTAL HIP ARTHROPLASTY ANTERIOR  APPROACH;  Surgeon: Leandrew Koyanagi, MD;  Location: [facility];  Service: Orthopedics;  Laterality: Left;   TOTAL HIP ARTHROPLASTY Right 07/23/2021   Procedure: RIGHT TOTAL HIP REPLACEMENT;  Surgeon: Leandrew Koyanagi, MD;  Location: [facility];  Service: Orthopedics;  Laterality: Right;   VAGINAL HYSTERECTOMY      Prior to Admission medications   Medication Sig Start Date End Date Taking? Authorizing Provider  amLODipine (NORVASC) 5 MG tablet Take 1 tablet (5 mg total) by mouth daily. 11/07/21  Yes Ann Held, DO  aspirin EC 81 MG tablet Take 1 tablet (81 mg total) by mouth 2 (two) times daily. To be taken after surgery to prevent blood clots 07/16/21  Yes Dwana Melena L, PA-C  atorvastatin (LIPITOR) 10 MG tablet Take 1 tablet (10 mg total) by mouth daily. 11/07/21  Yes Ann Held, DO  bisoprolol-hydrochlorothiazide (ZIAC) 10-6.25 MG tablet Take 1 tablet by mouth daily. 11/07/21  Yes Roma Schanz R, DO  Blood Glucose Monitoring Suppl (ONE TOUCH ULTRA 2) w/Device KIT Use to check blood glucose daily (Dx: E11.9 - type 2 DM) 02/13/22  Yes Lowne Koren Shiver, DO  Carboxymethylcellul-Glycerin (LUBRICATING EYE DROPS OP) Place 1 drop into both eyes daily as needed (dry eyes).   Yes [provider]  dapagliflozin propanediol (FARXIGA) 10 MG TABS tablet Take 1 tablet (10 mg total)  by mouth daily before breakfast. 12/13/21  Yes Ann Held, DO  docusate sodium (COLACE) 100 MG capsule Take 1 capsule (100 mg total) by mouth daily as needed. 07/16/21 07/16/22 Yes Aundra Dubin, PA-C  escitalopram (LEXAPRO) 10 MG tablet Take 1 tablet (10 mg total) by mouth daily. 11/07/21  Yes Ann Held, DO  estradiol (ESTRACE) 1 MG tablet Take 1 tablet (1 mg total) by mouth daily. 11/07/21  Yes Roma Schanz R, DO  furosemide (LASIX) 20 MG tablet Take 1 tablet (20 mg total) by mouth daily. 11/07/21  Yes Roma Schanz R, DO  gabapentin (NEURONTIN) 600 MG tablet Take 600 mg by mouth 4  (four) times daily. 09/06/20  Yes [provider]  glucose blood (ONE TOUCH ULTRA TEST) test strip Check Blood sugar once daily 02/13/22  Yes Lowne Chase, Yvonne R, DO  KLOR-CON M20 20 MEQ tablet Take 1 tablet (20 mEq total) by mouth daily. 11/07/21  Yes Roma Schanz R, DO  morphine (MS CONTIN) 30 MG 12 hr tablet Take 30 mg by mouth every 12 (twelve) hours. Per Dr. Nicholaus Bloom with Pain Mgmt 10/08/17  Yes [provider]  Multiple Vitamin (MULTIVITAMIN) capsule Take 1 capsule by mouth daily.   Yes [provider]  omeprazole (PRILOSEC) 40 MG capsule Take 1 capsule (40 mg total) by mouth daily. 11/07/21  Yes Carollee Herter, Alferd Apa, DO  OneTouch Delica Lancets 99991111 MISC Check blood sugar once daily 02/13/22  Yes Lowne Lyndal Pulley R, DO  sitaGLIPtin-metformin (JANUMET) 50-1000 MG tablet Take 2 tablets by mouth at bedtime. 11/07/21  Yes Ann Held, DO  Specialty Vitamins Products (BIOTIN PLUS KERATIN PO) Take 1 capsule by mouth daily.   Yes [provider]  tiZANidine (ZANAFLEX) 4 MG tablet Take 1 tablet (4 mg total) by mouth 3 (three) times daily as needed for muscle spasms. 01/30/22  Yes Roma Schanz R, DO  VITAMIN D PO Take 1,000 Units by mouth daily.   Yes [provider]  calcium carbonate (TUMS - DOSED IN MG ELEMENTAL CALCIUM) 500 MG chewable tablet Chew 2 tablets by mouth daily as needed for indigestion or heartburn. Patient not taking: Reported on 05/30/2022    [provider]  doxepin (SINEQUAN) 10 MG capsule Take 10 mg by mouth at bedtime as needed. Patient not taking: Reported on 05/30/2022    [provider]    Current Outpatient Medications  Medication Sig Dispense Refill   amLODipine (NORVASC) 5 MG tablet Take 1 tablet (5 mg total) by mouth daily. 90 tablet 1   aspirin EC 81 MG tablet Take 1 tablet (81 mg total) by mouth 2 (two) times daily. To be taken after surgery to prevent blood clots 84 tablet 0    atorvastatin (LIPITOR) 10 MG tablet Take 1 tablet (10 mg total) by mouth daily. 90 tablet 1   bisoprolol-hydrochlorothiazide (ZIAC) 10-6.25 MG tablet Take 1 tablet by mouth daily. 90 tablet 1   Blood Glucose Monitoring Suppl (ONE TOUCH ULTRA 2) w/Device KIT Use to check blood glucose daily (Dx: E11.9 - type 2 DM) 1 kit 0   Carboxymethylcellul-Glycerin (LUBRICATING EYE DROPS OP) Place 1 drop into both eyes daily as needed (dry eyes).     dapagliflozin propanediol (FARXIGA) 10 MG TABS tablet Take 1 tablet (10 mg total) by mouth daily before breakfast. 90 tablet 1   docusate sodium (COLACE) 100 MG capsule Take 1 capsule (100 mg total) by mouth  daily as needed. 30 capsule 2   escitalopram (LEXAPRO) 10 MG tablet Take 1 tablet (10 mg total) by mouth daily. 90 tablet 1   estradiol (ESTRACE) 1 MG tablet Take 1 tablet (1 mg total) by mouth daily. 90 tablet 1   furosemide (LASIX) 20 MG tablet Take 1 tablet (20 mg total) by mouth daily. 90 tablet 1   gabapentin (NEURONTIN) 600 MG tablet Take 600 mg by mouth 4 (four) times daily.     glucose blood (ONE TOUCH ULTRA TEST) test strip Check Blood sugar once daily 100 each 5   KLOR-CON M20 20 MEQ tablet Take 1 tablet (20 mEq total) by mouth daily. 90 tablet 1   morphine (MS CONTIN) 30 MG 12 hr tablet Take 30 mg by mouth every 12 (twelve) hours. Per Dr. Nicholaus Bloom with Pain Mgmt  0   Multiple Vitamin (MULTIVITAMIN) capsule Take 1 capsule by mouth daily.     omeprazole (PRILOSEC) 40 MG capsule Take 1 capsule (40 mg total) by mouth daily. 90 capsule 1   OneTouch Delica Lancets 99991111 MISC Check blood sugar once daily 100 each 5   sitaGLIPtin-metformin (JANUMET) 50-1000 MG tablet Take 2 tablets by mouth at bedtime. 180 tablet 1   Specialty Vitamins Products (BIOTIN PLUS KERATIN PO) Take 1 capsule by mouth daily.     tiZANidine (ZANAFLEX) 4 MG tablet Take 1 tablet (4 mg total) by mouth 3 (three) times daily as needed for muscle spasms. 270 tablet 0   VITAMIN D PO Take  1,000 Units by mouth daily.     calcium carbonate (TUMS - DOSED IN MG ELEMENTAL CALCIUM) 500 MG chewable tablet Chew 2 tablets by mouth daily as needed for indigestion or heartburn. (Patient not taking: Reported on 05/30/2022)     doxepin (SINEQUAN) 10 MG capsule Take 10 mg by mouth at bedtime as needed. (Patient not taking: Reported on 05/30/2022)     Current Facility-Administered Medications  Medication Dose Route Frequency Provider Last Rate Last Admin   0.9 %  sodium chloride infusion  500 mL Intravenous Once Ladene Artist, MD        Allergies as of 05/30/2022 - Review Complete 05/30/2022  Allergen Reaction Noted   Sulfonamide derivatives Hives     Family History  Problem Relation Age of Onset   Cancer Mother        breast   Depression Mother        bipolar   Breast cancer Mother 31   Heart disease Father 38       MI   Diabetes Brother    Kidney disease Brother    Hypertension Brother    Hyperlipidemia Brother    Stroke Brother    Coronary artery disease Other    Diabetes Other    Colon cancer Neg Hx    Colon polyps Neg Hx    Esophageal cancer Neg Hx    Rectal cancer Neg Hx    Stomach cancer Neg Hx     Social History   Socioeconomic History   Marital status: Widowed    Spouse name: Not on file   Number of children: Not on file   Years of education: Not on file   Highest education level: Not on file  Occupational History   Not on file  Tobacco Use   Smoking status: Never   Smokeless tobacco: Never  Vaping Use   Vaping Use: Never used  Substance and Sexual Activity   Alcohol use: Never  Drug use: Never   Sexual activity: Not Currently    Partners: Male  Other Topics Concern   Not on file  Social History Narrative   Not on file   Social Determinants of Health   Financial Resource Strain: Low Risk  (01/25/2021)   Overall Financial Resource Strain (CARDIA)    Difficulty of Paying Living Expenses: Not hard at all  Food Insecurity: No Food Insecurity  (03/26/2022)   Hunger Vital Sign    Worried About Running Out of Food in the Last Year: Never true    Ran Out of Food in the Last Year: Never true  Transportation Needs: No Transportation Needs (03/26/2022)   PRAPARE - Hydrologist (Medical): No    Lack of Transportation (Non-Medical): No  Physical Activity: Insufficiently Active (11/30/2021)   Exercise Vital Sign    Days of Exercise per Week: 3 days    Minutes of Exercise per Session: 20 min  Stress: No Stress Concern Present (03/26/2022)   [place]    Feeling of Stress : Not at all  Social Connections: Moderately Isolated (03/26/2022)   Social Connection and Isolation Panel [NHANES]    Frequency of Communication with Friends and Family: More than three times a week    Frequency of Social Gatherings with Friends and Family: Twice a week    Attends Religious Services: More than 4 times per year    Active Member of [employer] or Organizations: No    Attends Archivist Meetings: Never    Marital Status: Widowed  Intimate Partner Violence: Not At Risk (03/26/2022)   Humiliation, Afraid, Rape, and Kick questionnaire    Fear of Current or Ex-Partner: No    Emotionally Abused: No    Physically Abused: No    Sexually Abused: No    Review of Systems:  All systems reviewed were negative except where noted in HPI.   Physical Exam: General:  Alert, well-developed, in NAD Head:  Normocephalic and atraumatic. Eyes:  Sclera clear, no icterus.   Conjunctiva pink. Ears:  Normal auditory acuity. Mouth:  No deformity or lesions.  Neck:  Supple; no masses. Lungs:  Clear throughout to auscultation.   No wheezes, crackles, or rhonchi.  Heart:  Regular rate and rhythm; no murmurs. Abdomen:  Soft, nondistended, nontender. No masses, hepatomegaly. No palpable masses.  Normal bowel sounds.    Rectal:  Deferred   Msk:  Symmetrical without gross  deformities. Extremities:  Without edema. Neurologic:  Alert and  oriented x 4; grossly normal neurologically. Skin:  Intact without significant lesions or rashes. Psych:  Alert and cooperative. Normal mood and affect.   Pricilla Riffle. Fuller Plan  05/30/2022, 10:19 AM See Shea Evans, [facility], to contact our on call provider

## 2022-05-31 ENCOUNTER — Telehealth: Payer: Self-pay

## 2022-05-31 NOTE — Telephone Encounter (Signed)
Follow up call to pt, no answer.

## 2022-06-04 ENCOUNTER — Ambulatory Visit (INDEPENDENT_AMBULATORY_CARE_PROVIDER_SITE_OTHER): Payer: Medicare HMO | Admitting: Orthopaedic Surgery

## 2022-06-04 ENCOUNTER — Encounter: Payer: Self-pay | Admitting: Orthopaedic Surgery

## 2022-06-04 ENCOUNTER — Ambulatory Visit (INDEPENDENT_AMBULATORY_CARE_PROVIDER_SITE_OTHER): Payer: Medicare HMO

## 2022-06-04 DIAGNOSIS — Z96641 Presence of right artificial hip joint: Secondary | ICD-10-CM

## 2022-06-04 DIAGNOSIS — Z96642 Presence of left artificial hip joint: Secondary | ICD-10-CM

## 2022-06-04 NOTE — Progress Notes (Signed)
Office Visit Note   Patient: Brittany [name]           Date of Birth: 1946-01-25           MRN: [id number] Visit Date: 06/04/2022              Requested by: [address] [place], [place] [address] [place],  [place] [address] PCP: Carollee Herter, Alferd Apa, DO   Assessment & Plan: Visit Diagnoses:  1. Status post total replacement of right hip   2. Status post total replacement of left hip     Plan: Brittany [name] has done very well from her surgeries and she is very pleased.  Dental prophylaxis reinforced.  Activity as tolerated.  Follow-up in another year for 2-year visit with standing AP pelvis x-rays.  Follow-Up Instructions: Return in about 1 year (around 06/05/2023).   Orders:  Orders Placed This Encounter  Procedures   XR HIP UNILAT W OR W/O PELVIS 2-3 VIEWS RIGHT   No orders of the defined types were placed in this encounter.     Procedures: No procedures performed   Clinical Data: No additional findings.   Subjective: Chief Complaint  Patient presents with   Right Hip - Follow-up    Right total hip arthroplasty 07/23/2021    HPI  Brittany [name] is following up for her 1 year visit status post right total hip replacement and she is about 3-1/2 years status post left total hip replacement.  She has done well from both surgeries and very pleased with the outcome.  She has no complaints.  Review of Systems   Objective: Vital Signs: There were no vitals taken for this visit.  Physical Exam  Ortho Exam  Examination of bilateral hips show fully healed surgical scars.  She has fluid painless range of motion.  She is at baseline gait.  Specialty Comments:  No specialty comments available.  Imaging: XR HIP UNILAT W OR W/O PELVIS 2-3 VIEWS RIGHT  Result Date: 06/04/2022 X-rays show stable bilateral total hip replacements in good alignment and position without any complications.    PMFS History: Patient Active Problem List   Diagnosis Date Noted    Preventative health care 05/21/2022   Primary osteoarthritis of right hip 07/23/2021   Degenerative joint disease of right hip 07/23/2021   Status post total replacement of right hip 07/23/2021   Abdominal bloating 10/26/2019   Ulcer of toe of left foot, limited to breakdown of skin ([place]) 07/13/2019   Status post total replacement of left hip 11/16/2018   Vaginal prolapse 09/30/2018   Primary osteoarthritis of left hip 06/10/2018   Osteomyelitis of second toe of right foot ([place]) 04/24/2018   Osteomyelitis of right foot ([place]) 04/21/2018   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 ([place]) 04/17/2018   Callus 01/13/2018   Generalized anxiety disorder 10/16/2017   Menopause 10/16/2017   Diabetes mellitus, type II ([place]) 10/16/2017   Hyperlipidemia associated with type 2 diabetes mellitus ([place]) 10/16/2017   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 ([place]) 10/14/2017   Osteomyelitis of great toe of right foot ([place]) 09/25/2017   Pain in right foot 09/16/2017   Cellulitis 08/08/2017   Hypoglycemia without diagnosis of diabetes mellitus 08/08/2017   Peripheral neuropathy 08/08/2017   Abscess 08/08/2017   Hyperlipidemia LDL goal <70 09/22/2016   Tooth pain 09/03/2012   Edema 09/03/2012   Supraclavicular fossa fullness 06/18/2012   Hair loss 09/18/2010  Fatigue 09/18/2010   DYSPEPSIA&OTHER SPEC DISORDERS FUNCTION STOMACH 05/08/2010   DIARRHEA 05/08/2010   DIZZINESS 04/19/2010   OTHER ACUTE SINUSITIS 02/28/2010   NAUSEA 02/28/2010   Pain in right ankle and joints of right foot 02/15/2010   URI 07/09/2007   GERD 02/12/2007   LOW BACK PAIN 02/12/2007   Hypertension 09/03/2006   HOT FLASHES 09/03/2006   INSOMNIA 09/03/2006   Past Medical History:  Diagnosis Date   Anemia    as a younger woman   Anxiety    Arthritis    "all over my body" (10/01/2017)   Chronic pain    In pain clinic    Constipation    Depression    Diabetic peripheral neuropathy (HCC)     GERD (gastroesophageal reflux disease)    High cholesterol    History of hiatal hernia    Hypertension    Type II diabetes mellitus ([place]) dx'd ~ 2008    Family History  Problem Relation Age of Onset   Cancer Mother        breast   Depression Mother        bipolar   Breast cancer Mother 39   Heart disease Father 72       MI   Diabetes Brother    Kidney disease Brother    Hypertension Brother    Hyperlipidemia Brother    Stroke Brother    Coronary artery disease Other    Diabetes Other    Colon cancer Neg Hx    Colon polyps Neg Hx    Esophageal cancer Neg Hx    Rectal cancer Neg Hx    Stomach cancer Neg Hx     Past Surgical History:  Procedure Laterality Date   AMPUTATION Right 09/29/2017   Procedure: AMPUTATION RIGHT 1ST RAY;  Surgeon: Leandrew Koyanagi, MD;  Location: [facility];  Service: Orthopedics;  Laterality: Right;   AMPUTATION Right 01/29/2019   Procedure: RIGHT TRANSMETATARSAL AMPUTATION;  Surgeon: Newt Minion, MD;  Location: [facility];  Service: Orthopedics;  Laterality: Right;   AMPUTATION TOE Right 04/24/2018   Procedure: RIGHT 2ND TOE PROXIMAL INTERPHALANGEAL JOINT DISARTICULATION;  Surgeon: Leandrew Koyanagi, MD;  Location: [facility];  Service: Orthopedics;  Laterality: Right;   APPLICATION OF WOUND VAC Right 09/29/2017   "foot"   BLADDER SUSPENSION  07/13/2020   Dr Zigmund Daniel    COLONOSCOPY     I & D EXTREMITY Right 08/10/2017   Procedure: IRRIGATION AND DEBRIDEMENT FOOT;  Surgeon: Leandrew Koyanagi, MD;  Location: [facility];  Service: Orthopedics;  Laterality: Right;   LUMBAR DISC SURGERY  1992   POLYPECTOMY     POSTERIOR LUMBAR FUSION  1996   TOE SURGERY Right 1980s   bone removed "inbetween my toes"   TOTAL HIP ARTHROPLASTY Left 11/16/2018   Procedure: LEFT TOTAL HIP ARTHROPLASTY ANTERIOR APPROACH;  Surgeon: Leandrew Koyanagi, MD;  Location: [facility];  Service: Orthopedics;  Laterality: Left;   TOTAL HIP ARTHROPLASTY Right 07/23/2021   Procedure: RIGHT TOTAL HIP  REPLACEMENT;  Surgeon: Leandrew Koyanagi, MD;  Location: [facility];  Service: Orthopedics;  Laterality: Right;   VAGINAL HYSTERECTOMY     Social History   Occupational History   Not on file  Tobacco Use   Smoking status: Never   Smokeless tobacco: Never  Vaping Use   Vaping Use: Never used  Substance and Sexual Activity   Alcohol use: Never   Drug use: Never   Sexual activity: Not  Currently    Partners: Male

## 2022-06-06 DIAGNOSIS — H04123 Dry eye syndrome of bilateral lacrimal glands: Secondary | ICD-10-CM | POA: Diagnosis not present

## 2022-06-06 DIAGNOSIS — H52203 Unspecified astigmatism, bilateral: Secondary | ICD-10-CM | POA: Diagnosis not present

## 2022-06-06 LAB — HM DIABETES EYE EXAM

## 2022-06-10 ENCOUNTER — Encounter: Payer: Self-pay | Admitting: Gastroenterology

## 2022-06-10 ENCOUNTER — Telehealth: Payer: Self-pay | Admitting: Family Medicine

## 2022-06-10 NOTE — Telephone Encounter (Signed)
06/17/2022 appt is fine.

## 2022-06-10 NOTE — Telephone Encounter (Signed)
Pt needed to r/s her on 06/12/22 because she has pain mgmt appt same day. Appt r/s to 06/17/22. Please advise pt if appt needs to be changed.

## 2022-06-12 ENCOUNTER — Telehealth: Payer: Medicare HMO

## 2022-06-12 DIAGNOSIS — G894 Chronic pain syndrome: Secondary | ICD-10-CM | POA: Diagnosis not present

## 2022-06-12 DIAGNOSIS — Z79891 Long term (current) use of opiate analgesic: Secondary | ICD-10-CM | POA: Diagnosis not present

## 2022-06-12 DIAGNOSIS — G4700 Insomnia, unspecified: Secondary | ICD-10-CM | POA: Diagnosis not present

## 2022-06-12 DIAGNOSIS — E1142 Type 2 diabetes mellitus with diabetic polyneuropathy: Secondary | ICD-10-CM | POA: Diagnosis not present

## 2022-06-17 ENCOUNTER — Ambulatory Visit (INDEPENDENT_AMBULATORY_CARE_PROVIDER_SITE_OTHER): Payer: Medicare HMO | Admitting: Pharmacist

## 2022-06-17 DIAGNOSIS — G4700 Insomnia, unspecified: Secondary | ICD-10-CM

## 2022-06-17 DIAGNOSIS — I10 Essential (primary) hypertension: Secondary | ICD-10-CM

## 2022-06-17 DIAGNOSIS — E1169 Type 2 diabetes mellitus with other specified complication: Secondary | ICD-10-CM

## 2022-06-17 NOTE — Progress Notes (Signed)
Pharmacy Note  06/17/2022 Name: Brittany [name] MRN: [id number] DOB: [birth year]  Subjective: Brittany [name] is a 77 y.o. year old female who is a primary care patient of Carollee Herter, Alferd Apa, DO. Clinical Pharmacist Practitioner referral was placed to assist with medication and diabetes management.    Engaged with patient by telephone for follow up visit today.  Medication Maintenance: Reviewed medications and updated medication list.  Patient mentions she has stopped doxepin due to concerns regarding falls. She is having trouble with sleep from time to time. She asks if there is anything over-the-counter she could take for sleep.  Patient is now using [facility] since she switched to [facility].  Medications have been synced and she is getting maintenance medications filled on same day every 90 days.   Type 2 DM:  Last A1c was 7.0 % Current medications: Farxiga '10mg'$  daily and Janumet 50/'1000mg'$  twice a day.  She has not been checking blood glucose at home. She prefers not to check blood glucose.  Has One Touch Mini glucometer.  Eye Exam - per patient had visit last week. No report from ophthalmologist.   Hypertension:  Current medications: amlodipine '5mg'$  daily and bisoprolol hydrochlorothiazide daily Patient has not been checking blood pressure at home. Last office blood pressure was at goal of < 130/80.   BP Readings from Last 3 Encounters:  05/30/22 (!) 128/54  05/21/22 120/70  11/19/21 110/60     Health Maintenance:  Eye exam - done last week per patient at [facility].  Mammogram - past due. Last was 2019. Was abnormal and additional testing was negative for malignancy.   Objective: Review of patient status, including review of consultants reports, laboratory and other test data, was performed as part of comprehensive evaluation and provision of chronic care management services.   Lab Results  Component Value Date   CREATININE 0.85  05/21/2022   CREATININE 0.83 03/12/2022   CREATININE 0.86 12/06/2021    Lab Results  Component Value Date   HGBA1C 7.0 (H) 05/21/2022       Component Value Date/Time   CHOL 173 05/21/2022 1349   TRIG 92.0 05/21/2022 1349   HDL 87.70 05/21/2022 1349   CHOLHDL 2 05/21/2022 1349   VLDL 18.4 05/21/2022 1349   LDLCALC 67 05/21/2022 1349   LDLCALC 70 12/27/2019 1333     Clinical ASCVD: Yes  The 10-year ASCVD risk score (Arnett DK, et al., 2019) is: 32.7%   Values used to calculate the score:     Age: 18 years     Sex: Female     Is Non-Hispanic African American: No     Diabetic: Yes     Tobacco smoker: No     Systolic Blood Pressure: 0000000 mmHg     Is BP treated: Yes     HDL Cholesterol: 87.7 mg/dL     Total Cholesterol: 173 mg/dL    BP Readings from Last 3 Encounters:  05/30/22 (!) 128/54  05/21/22 120/70  11/19/21 110/60     Allergies  Allergen Reactions   Sulfonamide Derivatives Hives    Medications Reviewed Today     Reviewed by Cherre Robins, RPH-CPP (Pharmacist) on 06/17/22 at 1108  Med List Status: <None>   Medication Order Taking? Sig Documenting Provider Last Dose Status Informant  amLODipine (NORVASC) 5 MG tablet [id number] Yes Take 1 tablet (5 mg total) by mouth daily. Roma Schanz R, DO Taking Active   aspirin EC 81 MG  tablet [id number] Yes Take 1 tablet (81 mg total) by mouth 2 (two) times daily. To be taken after surgery to prevent blood clots Aundra Dubin, PA-C Taking Active   atorvastatin (LIPITOR) 10 MG tablet [id number] Yes Take 1 tablet (10 mg total) by mouth daily. Roma Schanz R, DO Taking Active   bisoprolol-hydrochlorothiazide [facility]) 10-6.25 MG tablet [id number] Yes Take 1 tablet by mouth daily. Roma Schanz R, DO Taking Active   Blood Glucose Monitoring Suppl (ONE TOUCH ULTRA 2) w/Device KIT [id number] No Use to check blood glucose daily (Dx: E11.9 - type 2 DM)  Patient not taking: Reported on 06/17/2022   Ann Held, DO Not Taking Active   calcium carbonate (TUMS - DOSED IN MG ELEMENTAL CALCIUM) 500 MG chewable tablet [id number] Yes Chew 2 tablets by mouth daily as needed for indigestion or heartburn. [provider] Taking Active Self  Carboxymethylcellul-Glycerin (LUBRICATING EYE DROPS OP) [id number] Yes Place 1 drop into both eyes daily as needed (dry eyes). [provider] Taking Active Self  dapagliflozin propanediol (FARXIGA) 10 MG TABS tablet [id number] Yes Take 1 tablet (10 mg total) by mouth daily before breakfast. Carollee Herter, Alferd Apa, DO Taking Active   docusate sodium (COLACE) 100 MG capsule [id number] Yes Take 1 capsule (100 mg total) by mouth daily as needed.  Patient taking differently: Take 100 mg by mouth 2 (two) times daily as needed.   Aundra Dubin, PA-C Taking Active   escitalopram (LEXAPRO) 10 MG tablet [id number] Yes Take 1 tablet (10 mg total) by mouth daily. Ann Held, DO Taking Active   estradiol (ESTRACE) 1 MG tablet [id number] Yes Take 1 tablet (1 mg total) by mouth daily. Ann Held, DO Taking Active   furosemide (LASIX) 20 MG tablet [id number] Yes Take 1 tablet (20 mg total) by mouth daily. Ann Held, DO Taking Active   gabapentin (NEURONTIN) 600 MG tablet [id number] Yes Take 600 mg by mouth 4 (four) times daily. [provider] Taking Active Self  glucose blood (ONE TOUCH ULTRA TEST) test strip [id number] No Check Blood sugar once daily  Patient not taking: Reported on 06/17/2022   Ann Held, DO Not Taking Active   KLOR-CON M20 20 MEQ tablet [id number] Yes Take 1 tablet (20 mEq total) by mouth daily. Ann Held, DO Taking Active   morphine (MS CONTIN) 30 MG 12 hr tablet [id number] Yes Take 30 mg by mouth every 12 (twelve) hours. Per Dr. Nicholaus Bloom with Pain Mgmt [provider] Taking Active Self  Multiple Vitamin (MULTIVITAMIN) capsule [id number] Yes Take 1 capsule by mouth daily.  [provider] Taking Active Self  omeprazole (PRILOSEC) 40 MG capsule [id number] Yes Take 1 capsule (40 mg total) by mouth daily. Ann Held, DO Taking Active   OneTouch Delica Lancets [id number] [place] [id number] No Check blood sugar once daily  Patient not taking: Reported on 06/17/2022   Ann Held, DO Not Taking Active   sitaGLIPtin-metformin (JANUMET) 50-1000 MG tablet [id number] Yes Take 2 tablets by mouth at bedtime. Ann Held, DO Taking Active   Specialty Vitamins Products (BIOTIN PLUS KERATIN PO) [id number] Yes Take 1 capsule by mouth daily. [provider] Taking Active Self  tiZANidine (ZANAFLEX) 4 MG tablet [id number] Yes Take 1 tablet (4 mg total) by mouth 3 (three) times daily as needed for muscle spasms. Ann Held, DO Taking Active   VITAMIN  D PO [id number] Yes Take 1,000 Units by mouth daily. [provider] Taking Active Self            Patient Active Problem List   Diagnosis Date Noted   Preventative health care 05/21/2022   Primary osteoarthritis of right hip 07/23/2021   Degenerative joint disease of right hip 07/23/2021   Status post total replacement of right hip 07/23/2021   Abdominal bloating 10/26/2019   Ulcer of toe of left foot, limited to breakdown of skin ([place]) 07/13/2019   Status post total replacement of left hip 11/16/2018   Vaginal prolapse 09/30/2018   Primary osteoarthritis of left hip 06/10/2018   Osteomyelitis of second toe of right foot ([place]) 04/24/2018   Osteomyelitis of right foot ([place]) 04/21/2018   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 ([place]) 04/17/2018   Callus 01/13/2018   Generalized anxiety disorder 10/16/2017   Menopause 10/16/2017   Diabetes mellitus, type II ([place]) 10/16/2017   Hyperlipidemia associated with type 2 diabetes mellitus ([place]) 10/16/2017   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 ([place]) 10/14/2017   Osteomyelitis of great toe of right  foot ([place]) 09/25/2017   Pain in right foot 09/16/2017   Cellulitis 08/08/2017   Hypoglycemia without diagnosis of diabetes mellitus 08/08/2017   Peripheral neuropathy 08/08/2017   Abscess 08/08/2017   Hyperlipidemia LDL goal <70 09/22/2016   Tooth pain 09/03/2012   Edema 09/03/2012   Supraclavicular fossa fullness 06/18/2012   Hair loss 09/18/2010   Fatigue 09/18/2010   DYSPEPSIA&OTHER SPEC DISORDERS FUNCTION STOMACH 05/08/2010   DIARRHEA 05/08/2010   DIZZINESS 04/19/2010   OTHER ACUTE SINUSITIS 02/28/2010   NAUSEA 02/28/2010   Pain in right ankle and joints of right foot 02/15/2010   URI 07/09/2007   GERD 02/12/2007   LOW BACK PAIN 02/12/2007   Hypertension 09/03/2006   HOT FLASHES 09/03/2006   INSOMNIA 09/03/2006     Medication Assistance:  None required.  Patient affirms current coverage meets needs.   Assessment / Plan: Medication Maintenance: Reviewed medications and updated medication list.  Removed doxepin from list - patient stopped due to concerns that it would increase falls.  Recommended she could try melatonin 3 to 5 mg at bedtime to see if that helped with sleep.   Type 2 DM: Last A1c was 7.0% Continue current medications for DM - Farxiga and Janumet. Patient endorsed that she does not have any issues with cost of medications.   Hypertension:  Continue current blood pressure medications.  Restart checking blood pressure 2 or 3 times per week.  Reviewed blood pressure goal of < 130/80.   Health Maintenance:  Requested notes from diabetic eye exam from [facility]. They are faxing over.  Mammogram - Scheduled for 06/30/2022  Follow Up:  Telephone follow up appointment with care management team member scheduled for:  4 to 5  months   Cherre Robins, PharmD Clinical Pharmacist [place] [facility] [facility] [phone]

## 2022-06-21 ENCOUNTER — Telehealth: Payer: Self-pay | Admitting: Family Medicine

## 2022-06-21 NOTE — Telephone Encounter (Signed)
Prescription Request  06/21/2022  Is this a "Controlled Substance" medicine? No  LOV: 05/21/2022  What is the name of the medication or equipment?  dapagliflozin propanediol (FARXIGA) 10 MG TABS tablet   Have you contacted your pharmacy to request a refill? No  Patient would like a 30 day supply sent to [facility] and her regular 90 day supply to her mail in order. Please advise.    Which pharmacy would you like this sent to?  [facility], [address] [place] [address] Phone: [phone] Fax: [phone]  [facility] [place] [facility] [address] [address] Phone: [phone] Fax: [phone]    Patient notified that their request is being sent to the clinical staff for review and that they should receive a response within 2 business days.   Please advise at [place]

## 2022-06-24 MED ORDER — DAPAGLIFLOZIN PROPANEDIOL 10 MG PO TABS: 10.0000 mg | ORAL_TABLET | Freq: Every day | ORAL | 0 refills | Status: DC

## 2022-06-24 MED ORDER — DAPAGLIFLOZIN PROPANEDIOL 10 MG PO TABS: 10.0000 mg | ORAL_TABLET | Freq: Every day | ORAL | 1 refills | Status: DC

## 2022-06-24 NOTE — Telephone Encounter (Signed)
Rx refilled x2. 30 day supply sent to [facility] and [facility]

## 2022-06-24 NOTE — Addendum Note (Signed)
Addended by: Sanda Linger on: 06/24/2022 09:23 AM   Modules accepted: Orders

## 2022-06-24 NOTE — Addendum Note (Signed)
Addended by: Sanda Linger on: 06/24/2022 09:25 AM   Modules accepted: Orders

## 2022-06-26 ENCOUNTER — Other Ambulatory Visit (HOSPITAL_BASED_OUTPATIENT_CLINIC_OR_DEPARTMENT_OTHER): Payer: Medicare HMO

## 2022-06-26 ENCOUNTER — Ambulatory Visit (HOSPITAL_BASED_OUTPATIENT_CLINIC_OR_DEPARTMENT_OTHER): Payer: Medicare HMO

## 2022-07-05 ENCOUNTER — Other Ambulatory Visit (HOSPITAL_COMMUNITY): Payer: Self-pay

## 2022-07-17 ENCOUNTER — Other Ambulatory Visit (HOSPITAL_COMMUNITY): Payer: Self-pay

## 2022-07-17 ENCOUNTER — Telehealth: Payer: Self-pay | Admitting: Family Medicine

## 2022-07-17 DIAGNOSIS — E1165 Type 2 diabetes mellitus with hyperglycemia: Secondary | ICD-10-CM

## 2022-07-17 MED ORDER — GLUCOSE BLOOD VI STRP
ORAL_STRIP | 5 refills | Status: AC
Start: 2022-07-17 — End: ?

## 2022-07-17 MED ORDER — ONETOUCH DELICA LANCETS 33G MISC
5 refills | Status: AC
Start: 2022-07-17 — End: ?

## 2022-07-17 NOTE — Addendum Note (Signed)
Addended by: Roxanne Gates on: 07/17/2022 02:52 PM   Modules accepted: Orders

## 2022-07-17 NOTE — Telephone Encounter (Signed)
Refills sent

## 2022-07-17 NOTE — Telephone Encounter (Signed)
Prescription Request  07/17/2022  Is this a "Controlled Substance" medicine? No  LOV: 05/21/2022  What is the name of the medication or equipment?  Lancet  Test strips   Have you contacted your pharmacy to request a refill? Yes   Which pharmacy would you like this sent to?  [facility] [address] [place], [place] - [address] [place] [address] Phone: [phone] Fax: [phone]   Patient notified that their request is being sent to the clinical staff for review and that they should receive a response within 2 business days.   Please advise at [facility] [phone]

## 2022-08-20 DIAGNOSIS — G894 Chronic pain syndrome: Secondary | ICD-10-CM | POA: Diagnosis not present

## 2022-08-20 DIAGNOSIS — E1142 Type 2 diabetes mellitus with diabetic polyneuropathy: Secondary | ICD-10-CM | POA: Diagnosis not present

## 2022-08-20 DIAGNOSIS — G4700 Insomnia, unspecified: Secondary | ICD-10-CM | POA: Diagnosis not present

## 2022-08-20 DIAGNOSIS — Z79891 Long term (current) use of opiate analgesic: Secondary | ICD-10-CM | POA: Diagnosis not present

## 2022-08-27 ENCOUNTER — Other Ambulatory Visit: Payer: Medicare HMO

## 2022-09-03 ENCOUNTER — Other Ambulatory Visit (INDEPENDENT_AMBULATORY_CARE_PROVIDER_SITE_OTHER): Payer: Medicare HMO

## 2022-09-03 DIAGNOSIS — E1169 Type 2 diabetes mellitus with other specified complication: Secondary | ICD-10-CM

## 2022-09-03 DIAGNOSIS — E785 Hyperlipidemia, unspecified: Secondary | ICD-10-CM | POA: Diagnosis not present

## 2022-09-03 DIAGNOSIS — E1165 Type 2 diabetes mellitus with hyperglycemia: Secondary | ICD-10-CM | POA: Diagnosis not present

## 2022-09-03 LAB — COMPREHENSIVE METABOLIC PANEL
ALT: 14 U/L (ref 0–35)
AST: 15 U/L (ref 0–37)
Albumin: 3.9 g/dL (ref 3.5–5.2)
Alkaline Phosphatase: 50 U/L (ref 39–117)
BUN: 16 mg/dL (ref 6–23)
CO2: 30 mEq/L (ref 19–32)
Calcium: 8.9 mg/dL (ref 8.4–10.5)
Chloride: 100 mEq/L (ref 96–112)
Creatinine, Ser: 0.78 mg/dL (ref 0.40–1.20)
GFR: 74.81 mL/min (ref >60.00)
Glucose, Bld: 120 mg/dL — ABNORMAL HIGH (ref 70–99)
Potassium: 4.2 mEq/L (ref 3.5–5.1)
Sodium: 140 mEq/L (ref 135–145)
Total Bilirubin: 0.2 mg/dL (ref 0.2–1.2)
Total Protein: 6.7 g/dL (ref 6.0–8.3)

## 2022-09-03 LAB — LIPID PANEL
Cholesterol: 157 mg/dL (ref 0–200)
HDL: 80.6 mg/dL (ref >39.00)
LDL Cholesterol: 59 mg/dL (ref 0–99)
NonHDL: 76.8
Total CHOL/HDL Ratio: 2
Triglycerides: 90 mg/dL (ref 0.0–149.0)
VLDL: 18 mg/dL (ref 0.0–40.0)

## 2022-09-03 LAB — HEMOGLOBIN A1C: Hgb A1c MFr Bld: 6.8 % — ABNORMAL HIGH (ref 4.6–6.5)

## 2022-09-05 ENCOUNTER — Encounter: Payer: Self-pay | Admitting: Family Medicine

## 2022-09-06 NOTE — Progress Notes (Signed)
Mailed out letter. Done 

## 2022-10-14 ENCOUNTER — Other Ambulatory Visit: Payer: Self-pay | Admitting: Family Medicine

## 2022-10-14 DIAGNOSIS — F411 Generalized anxiety disorder: Secondary | ICD-10-CM

## 2022-10-14 DIAGNOSIS — K219 Gastro-esophageal reflux disease without esophagitis: Secondary | ICD-10-CM

## 2022-10-14 DIAGNOSIS — R609 Edema, unspecified: Secondary | ICD-10-CM

## 2022-10-14 DIAGNOSIS — I10 Essential (primary) hypertension: Secondary | ICD-10-CM

## 2022-10-14 DIAGNOSIS — E1169 Type 2 diabetes mellitus with other specified complication: Secondary | ICD-10-CM

## 2022-10-14 DIAGNOSIS — E1110 Type 2 diabetes mellitus with ketoacidosis without coma: Secondary | ICD-10-CM

## 2022-10-14 DIAGNOSIS — Z780 Asymptomatic menopausal state: Secondary | ICD-10-CM

## 2022-10-21 DIAGNOSIS — G4700 Insomnia, unspecified: Secondary | ICD-10-CM | POA: Diagnosis not present

## 2022-10-21 DIAGNOSIS — Z79891 Long term (current) use of opiate analgesic: Secondary | ICD-10-CM | POA: Diagnosis not present

## 2022-10-21 DIAGNOSIS — E1142 Type 2 diabetes mellitus with diabetic polyneuropathy: Secondary | ICD-10-CM | POA: Diagnosis not present

## 2022-10-21 DIAGNOSIS — G894 Chronic pain syndrome: Secondary | ICD-10-CM | POA: Diagnosis not present

## 2022-10-22 ENCOUNTER — Other Ambulatory Visit: Payer: Self-pay | Admitting: Family Medicine

## 2022-10-23 ENCOUNTER — Telehealth: Payer: Medicare HMO

## 2022-10-23 NOTE — Telephone Encounter (Signed)
Is patient taking 5mg  or 10mg  [place]? Please advise

## 2022-10-24 ENCOUNTER — Other Ambulatory Visit: Payer: Self-pay | Admitting: Family Medicine

## 2022-10-24 ENCOUNTER — Other Ambulatory Visit (INDEPENDENT_AMBULATORY_CARE_PROVIDER_SITE_OTHER): Payer: Medicare HMO | Admitting: Pharmacist

## 2022-10-24 DIAGNOSIS — E1165 Type 2 diabetes mellitus with hyperglycemia: Secondary | ICD-10-CM

## 2022-10-24 DIAGNOSIS — E119 Type 2 diabetes mellitus without complications: Secondary | ICD-10-CM

## 2022-10-24 DIAGNOSIS — I10 Essential (primary) hypertension: Secondary | ICD-10-CM

## 2022-10-24 DIAGNOSIS — Z7984 Long term (current) use of oral hypoglycemic drugs: Secondary | ICD-10-CM

## 2022-10-24 MED ORDER — DAPAGLIFLOZIN PROPANEDIOL 10 MG PO TABS
10.0000 mg | ORAL_TABLET | Freq: Every day | ORAL | 1 refills | Status: DC
Start: 2022-10-24 — End: 2023-02-12

## 2022-10-24 NOTE — Progress Notes (Signed)
Pharmacy Note  10/24/2022 Name: Brittany [name] MRN: [id number] DOB: [birth year]  Subjective: Brittany [name] is a 77 y.o. year old female who is a primary care patient of Zola Button, Grayling Congress, DO. Clinical Pharmacist Practitioner referral was placed to assist with medication and diabetes management.    Engaged with patient by telephone for follow up visit today.  Medication Maintenance: Patient receives Extra assistance with Medicare. Medication copays are usually $0 to $11.  Reviewed medications and updated medication list.  Removed Farxiga 5mg  from med list since she is now taking [place] 10mg  daily   Uses [facility] mail order. Medications have been synced and she is getting maintenance medications filled on same day every 90 days.   Type 2 DM:  Last A1c was improved at 6.8 % (previously was 7.0%) Current medications: Farxiga 10mg  daily and Janumet 50/1000mg  XR - take 2 tablets once a day.  She has not been checking blood glucose at home. She prefers not to check blood glucose.  Has One Touch Mini glucometer.  Eye Exam - Last exam 06/06/2022. Requested copy of last eye exam at our last visit - has been scanned and recorded   Hypertension:  Current medications: amlodipine 5mg  daily and bisoprolol hydrochlorothiazide daily Patient has not been checking blood pressure at home. Last office blood pressure was at goal of < 130/80.   BP Readings from Last 3 Encounters:  05/30/22 (!) 128/54  05/21/22 120/70  11/19/21 110/60     Health Maintenance:  Mammogram and repeat DEXA - past due. Patient has appt for both 06/30/2022 but she had to cancel. Has not rescheduled yet.    Objective: Review of patient status, including review of consultants reports, laboratory and other test data, was performed as part of comprehensive evaluation and provision of chronic care management services.   Lab Results  Component Value Date   CREATININE 0.78 09/03/2022   CREATININE 0.85 05/21/2022    CREATININE 0.83 03/12/2022    Lab Results  Component Value Date   HGBA1C 6.8 (H) 09/03/2022       Component Value Date/Time   CHOL 157 09/03/2022 0835   TRIG 90.0 09/03/2022 0835   HDL 80.60 09/03/2022 0835   CHOLHDL 2 09/03/2022 0835   VLDL 18.0 09/03/2022 0835   LDLCALC 59 09/03/2022 0835   LDLCALC 70 12/27/2019 1333     Clinical ASCVD: Yes  The 10-year ASCVD risk score (Arnett DK, et al., 2019) is: 32.4%   Values used to calculate the score:     Age: 70 years     Sex: Female     Is Non-Hispanic African American: No     Diabetic: Yes     Tobacco smoker: No     Systolic Blood Pressure: 128 mmHg     Is BP treated: Yes     HDL Cholesterol: 80.6 mg/dL     Total Cholesterol: 157 mg/dL    BP Readings from Last 3 Encounters:  05/30/22 (!) 128/54  05/21/22 120/70  11/19/21 110/60     Allergies  Allergen Reactions   Sulfonamide Derivatives Hives    Medications Reviewed Today     Reviewed by Henrene Pastor, RPH-CPP (Pharmacist) on 10/24/22 at 1035  Med List Status: <None>   Medication Order Taking? Sig Documenting Provider Last Dose Status Informant  amLODipine (NORVASC) 5 MG tablet [id number] Yes TAKE 1 TABLET DAILY Donato Schultz, DO Taking Active   aspirin EC 81 MG tablet [id number] Yes Take 1  tablet (81 mg total) by mouth 2 (two) times daily. To be taken after surgery to prevent blood clots  Patient taking differently: Take 81 mg by mouth daily.   Cristie Hem, PA-C Taking Active   atorvastatin (LIPITOR) 10 MG tablet [id number] Yes TAKE 1 TABLET DAILY Donato Schultz, DO Taking Active   bisoprolol-hydrochlorothiazide [facility]) 10-6.25 MG tablet [id number] Yes TAKE 1 TABLET DAILY Seabron Spates R, DO Taking Active   Blood Glucose Monitoring Suppl (ONE TOUCH ULTRA 2) w/Device KIT [id number] No Use to check blood glucose daily (Dx: E11.9 - type 2 DM)  Patient not taking: Reported on 06/17/2022   Donato Schultz, DO Not Taking Active   calcium  carbonate (TUMS - DOSED IN MG ELEMENTAL CALCIUM) 500 MG chewable tablet [id number] Yes Chew 2 tablets by mouth daily as needed for indigestion or heartburn. [provider] Taking Active Self  Carboxymethylcellul-Glycerin (LUBRICATING EYE DROPS OP) [id number] Yes Place 1 drop into both eyes daily as needed (dry eyes). [provider] Taking Active Self  dapagliflozin propanediol (FARXIGA) 10 MG TABS tablet [id number] Yes Take 1 tablet (10 mg total) by mouth daily before breakfast. Zola Button, Grayling Congress, DO Taking Active   escitalopram (LEXAPRO) 10 MG tablet [id number] Yes TAKE 1 TABLET DAILY Zola Button, Grayling Congress, DO Taking Active   estradiol (ESTRACE) 1 MG tablet [id number] Yes TAKE 1 TABLET DAILY Seabron Spates R, DO Taking Active   furosemide (LASIX) 20 MG tablet [id number] Yes TAKE 1 TABLET DAILY  Patient taking differently: Take 20 mg by mouth every other day.   Donato Schultz, DO Taking Active   gabapentin (NEURONTIN) 600 MG tablet [id number] Yes Take 600 mg by mouth 4 (four) times daily. [provider] Taking Active Self  glucose blood (ONE TOUCH ULTRA TEST) test strip [id number] No Check Blood sugar once daily  Patient not taking: Reported on 10/24/2022   Donato Schultz, DO Not Taking Active   KLOR-CON M20 20 MEQ tablet [id number]  TAKE 1 TABLET DAILY  Patient taking differently: Take 20 mEq by mouth daily. Take only on days with furosemide   Zola Button, Grayling Congress, DO  Active   morphine (MS CONTIN) 30 MG 12 hr tablet [id number] Yes Take 30 mg by mouth every 12 (twelve) hours. Per Dr. Thyra Breed with Pain Mgmt [provider] Taking Active Self  Multiple Vitamin (MULTIVITAMIN) capsule [id number] Yes Take 1 capsule by mouth daily. [provider] Taking Active Self  omeprazole (PRILOSEC) 40 MG capsule [id number] Yes TAKE 1 CAPSULE DAILY Zola Button, Grayling Congress, DO Taking Active   OneTouch Delica Lancets 33G MISC [id number] No Check blood  sugar once daily  Patient not taking: Reported on 10/24/2022   Donato Schultz, DO Not Taking Active   sitaGLIPtin-metformin (JANUMET) 50-1000 MG tablet [id number] Yes TAKE 2 TABLETS AT BEDTIME Donato Schultz, DO Taking Active   Specialty Vitamins Products (BIOTIN PLUS KERATIN PO) [id number] Yes Take 1 capsule by mouth daily. [provider] Taking Active Self  tiZANidine (ZANAFLEX) 4 MG tablet [id number] Yes Take 1 tablet (4 mg total) by mouth 3 (three) times daily as needed for muscle spasms. Donato Schultz, DO Taking Active   VITAMIN D PO [id number] Yes Take 1,000 Units by mouth daily. 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   Abdominal bloating 10/26/2019   Ulcer of toe of left foot, limited to breakdown of skin (HCC) 07/13/2019   Status post total replacement of left hip 11/16/2018   Vaginal prolapse 09/30/2018   Primary osteoarthritis of left hip 06/10/2018   Osteomyelitis of second toe of right foot (HCC) 04/24/2018   Osteomyelitis of right foot (HCC) 04/21/2018   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) 04/17/2018   Callus 01/13/2018   Generalized anxiety disorder 10/16/2017   Menopause 10/16/2017   Diabetes mellitus, type II (HCC) 10/16/2017   Hyperlipidemia associated with type 2 diabetes mellitus (HCC) 10/16/2017   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) 10/14/2017   Osteomyelitis of great toe of right foot (HCC) 09/25/2017   Pain in right foot 09/16/2017   Cellulitis 08/08/2017   Hypoglycemia without diagnosis of diabetes mellitus 08/08/2017   Peripheral neuropathy 08/08/2017   Abscess 08/08/2017   Hyperlipidemia LDL goal <70 09/22/2016   Tooth  pain 09/03/2012   Edema 09/03/2012   Supraclavicular fossa fullness 06/18/2012   Hair loss 09/18/2010   Fatigue 09/18/2010   DYSPEPSIA&OTHER SPEC DISORDERS FUNCTION STOMACH 05/08/2010   DIARRHEA 05/08/2010   DIZZINESS 04/19/2010   OTHER ACUTE SINUSITIS 02/28/2010   NAUSEA 02/28/2010   Pain in right ankle and joints of right foot 02/15/2010   URI 07/09/2007   GERD 02/12/2007   LOW BACK PAIN 02/12/2007   Hypertension 09/03/2006   HOT FLASHES 09/03/2006   INSOMNIA 09/03/2006     Medication Assistance:  None required.  Patient affirms current coverage meets needs.   Assessment / Plan: Medication Maintenance: Reviewed medications and updated medication list.    Type 2 DM: Last A1c was 7.0% Continue current medications for DM - Farxiga and Janumet.   Hypertension:  Continue current blood pressure medications.  Restart checking blood pressure 2 or 3 times per week.  Reviewed blood pressure goal of < 130/80.   Health Maintenance:  Reminded patient to rescheduled mammogram and DEXA - provided [facility] imaging number. [phone]   Follow Up:  no follow up needed at this time; patient is aware she can contact clinical pharmacist if she has questions or concerns.    Henrene Pastor, PharmD Clinical Pharmacist [facility]  - [facility] [phone]

## 2022-10-28 ENCOUNTER — Telehealth: Payer: Self-pay | Admitting: Family Medicine

## 2022-10-28 DIAGNOSIS — E1110 Type 2 diabetes mellitus with ketoacidosis without coma: Secondary | ICD-10-CM

## 2022-10-28 DIAGNOSIS — Z780 Asymptomatic menopausal state: Secondary | ICD-10-CM

## 2022-10-28 DIAGNOSIS — E1169 Type 2 diabetes mellitus with other specified complication: Secondary | ICD-10-CM

## 2022-10-28 DIAGNOSIS — I10 Essential (primary) hypertension: Secondary | ICD-10-CM

## 2022-10-28 NOTE — Telephone Encounter (Signed)
Pt is calling back and hoping to get rx called in asap as she is completely out.

## 2022-10-28 NOTE — Telephone Encounter (Signed)
[facility] [address] [place], [place] - [address] [place], [place] [address] Phone: [phone]  Fax: [phone]

## 2022-10-28 NOTE — Telephone Encounter (Signed)
Pt is having a hard time receiving some prescription through the mail and wondered if she could get a 10 day supply to hold her over.   amLODipine (NORVASC) 5 MG tablet atorvastatin (LIPITOR) 10 MG tablet bisoprolol-hydrochlorothiazide (ZIAC) 10-6.25 MG tablet estradiol (ESTRACE) 1 MG tablet sitaGLIPtin-metformin (JANUMET) 50-1000 MG tablet

## 2022-10-28 NOTE — Telephone Encounter (Signed)
[facility] called and is needing clarification on the dapagliflozin propanediol (FARXIGA) 10 MG TABS tablet. Please call  [phone]  option 2 Ref # [id number]

## 2022-10-29 MED ORDER — ATORVASTATIN CALCIUM 10 MG PO TABS
10.0000 mg | ORAL_TABLET | Freq: Every day | ORAL | 0 refills | Status: DC
Start: 2022-10-29 — End: 2023-04-14

## 2022-10-29 MED ORDER — ESTRADIOL 1 MG PO TABS
1.0000 mg | ORAL_TABLET | Freq: Every day | ORAL | 0 refills | Status: DC
Start: 2022-10-29 — End: 2023-04-14

## 2022-10-29 MED ORDER — BISOPROLOL-HYDROCHLOROTHIAZIDE 10-6.25 MG PO TABS
1.0000 | ORAL_TABLET | Freq: Every day | ORAL | 0 refills | Status: DC
Start: 2022-10-29 — End: 2023-04-14

## 2022-10-29 MED ORDER — AMLODIPINE BESYLATE 5 MG PO TABS
5.0000 mg | ORAL_TABLET | Freq: Every day | ORAL | 0 refills | Status: DC
Start: 2022-10-29 — End: 2023-04-14

## 2022-10-29 MED ORDER — JANUMET 50-1000 MG PO TABS
2.0000 | ORAL_TABLET | Freq: Every day | ORAL | 0 refills | Status: DC
Start: 2022-10-29 — End: 2023-04-14

## 2022-10-29 NOTE — Addendum Note (Signed)
Addended by: Roxanne Gates on: 10/29/2022 02:55 PM   Modules accepted: Orders

## 2022-10-29 NOTE — Telephone Encounter (Signed)
Refills sent

## 2022-10-30 NOTE — Telephone Encounter (Signed)
Spoke with pharmacy. Confirmed clarification on dosage of medication

## 2022-12-19 DIAGNOSIS — E1142 Type 2 diabetes mellitus with diabetic polyneuropathy: Secondary | ICD-10-CM | POA: Diagnosis not present

## 2022-12-19 DIAGNOSIS — Z79891 Long term (current) use of opiate analgesic: Secondary | ICD-10-CM | POA: Diagnosis not present

## 2022-12-19 DIAGNOSIS — G894 Chronic pain syndrome: Secondary | ICD-10-CM | POA: Diagnosis not present

## 2022-12-19 DIAGNOSIS — G4700 Insomnia, unspecified: Secondary | ICD-10-CM | POA: Diagnosis not present

## 2022-12-27 ENCOUNTER — Other Ambulatory Visit (HOSPITAL_COMMUNITY): Payer: Self-pay

## 2022-12-27 MED ORDER — MORPHINE SULFATE ER 30 MG PO TBCR
30.0000 mg | EXTENDED_RELEASE_TABLET | Freq: Two times a day (BID) | ORAL | 0 refills | Status: DC
  Filled 2022-12-27: qty 60, 30d supply, fill #0

## 2023-02-12 ENCOUNTER — Telehealth: Payer: Self-pay | Admitting: Family Medicine

## 2023-02-12 DIAGNOSIS — E1165 Type 2 diabetes mellitus with hyperglycemia: Secondary | ICD-10-CM

## 2023-02-12 MED ORDER — DAPAGLIFLOZIN PROPANEDIOL 10 MG PO TABS
10.0000 mg | ORAL_TABLET | Freq: Every day | ORAL | 1 refills | Status: DC
Start: 2023-02-12 — End: 2023-09-02

## 2023-02-12 NOTE — Telephone Encounter (Signed)
Prescription Request  02/12/2023  Is this a "Controlled Substance" medicine? No  LOV: Visit date not found  What is the name of the medication or equipment?   dapagliflozin propanediol (FARXIGA) 10 MG TABS tablet [[id number]]  Have you contacted your pharmacy to request a refill? No   Which pharmacy would you like this sent to?   [facility] [address] [place], [place] - [address] [place] [address] Phone: [phone] Fax: [phone]  Patient notified that their request is being sent to the clinical staff for review and that they should receive a response within 2 business days.   Please advise at Mobile [phone] (mobile)

## 2023-02-12 NOTE — Telephone Encounter (Signed)
Rx sent 

## 2023-02-12 NOTE — Addendum Note (Signed)
Addended by: Roxanne Gates on: 02/12/2023 01:23 PM   Modules accepted: Orders

## 2023-03-03 DIAGNOSIS — G894 Chronic pain syndrome: Secondary | ICD-10-CM | POA: Diagnosis not present

## 2023-03-03 DIAGNOSIS — E1142 Type 2 diabetes mellitus with diabetic polyneuropathy: Secondary | ICD-10-CM | POA: Diagnosis not present

## 2023-03-03 DIAGNOSIS — Z79891 Long term (current) use of opiate analgesic: Secondary | ICD-10-CM | POA: Diagnosis not present

## 2023-03-03 DIAGNOSIS — G4700 Insomnia, unspecified: Secondary | ICD-10-CM | POA: Diagnosis not present

## 2023-03-10 ENCOUNTER — Telehealth: Payer: Self-pay | Admitting: Family Medicine

## 2023-03-10 ENCOUNTER — Other Ambulatory Visit: Payer: Self-pay | Admitting: Family Medicine

## 2023-03-10 DIAGNOSIS — L989 Disorder of the skin and subcutaneous tissue, unspecified: Secondary | ICD-10-CM

## 2023-03-10 NOTE — Telephone Encounter (Signed)
Pt called and wanted to ask if pcp could send a referral to a podiatrist because she stated she has a spot on the bottom of her foot. She prefers a referral to be sent w/o an OV. Please call and advise.

## 2023-03-11 ENCOUNTER — Ambulatory Visit: Payer: Medicare HMO | Admitting: Family Medicine

## 2023-03-13 NOTE — Telephone Encounter (Signed)
Noted. Pt called. Unable to leave VM

## 2023-03-14 ENCOUNTER — Other Ambulatory Visit (INDEPENDENT_AMBULATORY_CARE_PROVIDER_SITE_OTHER): Payer: Medicare HMO

## 2023-03-14 ENCOUNTER — Encounter: Payer: Self-pay | Admitting: Orthopaedic Surgery

## 2023-03-14 ENCOUNTER — Ambulatory Visit: Payer: Medicare HMO | Admitting: Orthopaedic Surgery

## 2023-03-14 DIAGNOSIS — M79672 Pain in left foot: Secondary | ICD-10-CM | POA: Diagnosis not present

## 2023-03-14 MED ORDER — DOXYCYCLINE HYCLATE 100 MG PO TABS: 100.0000 mg | ORAL_TABLET | Freq: Two times a day (BID) | ORAL | 0 refills | Status: DC

## 2023-03-14 NOTE — Progress Notes (Signed)
Office Visit Note   Patient: Brittany [name]           Date of Birth: 05-13-1945           MRN: [id number] Visit Date: 03/14/2023              Requested by: [address], [place], [place] [address] [place],  [place] [address] PCP: Zola Button, Grayling Congress, DO   Assessment & Plan: Visit Diagnoses:  1. Pain in left foot     Plan: Impression is left foot Charcot collapse.  I have asked her to limit activity and weightbearing to the foot is much as possible and we will get her an appointment to see Dr. Lajoyce Corners ASAP.  We will put her on doxycycline just to be safe.  Follow-Up Instructions: No follow-ups on file.   Orders:  Orders Placed This Encounter  Procedures   XR Foot Complete Left   Meds ordered this encounter  Medications   doxycycline (VIBRA-TABS) 100 MG tablet    Sig: Take 1 tablet (100 mg total) by mouth 2 (two) times daily.    Dispense:  20 tablet    Refill:  0      Procedures: No procedures performed   Clinical Data: No additional findings.   Subjective: Chief Complaint  Patient presents with   Left Foot - Pain    HPI Brittany [name] is a 77 year old female who is a longtime patient of mine.  She comes in for evaluation of left foot evaluation.  She noticed a black area on the bottom of her midfoot about 2 weeks ago.  She has not noticed any drainage on her socks.  She has diabetic neuropathy and is completely insensate.  She wanted to see Dr. Lajoyce Corners but was unable to get an appointment sooner.  He has taking care of her for various diabetic issues.  Review of Systems  Constitutional: Negative.   HENT: Negative.    Eyes: Negative.   Respiratory: Negative.    Cardiovascular: Negative.   Endocrine: Negative.   Musculoskeletal: Negative.   Neurological: Negative.   Hematological: Negative.   Psychiatric/Behavioral: Negative.    All other systems reviewed and are negative.    Objective: Vital Signs: There were no vitals taken for this  visit.  Physical Exam Vitals and nursing note reviewed.  Constitutional:      Appearance: She is well-developed.  HENT:     Head: Atraumatic.     Nose: Nose normal.  Eyes:     Extraocular Movements: Extraocular movements intact.  Cardiovascular:     Pulses: Normal pulses.  Pulmonary:     Effort: Pulmonary effort is normal.  Abdominal:     Palpations: Abdomen is soft.  Musculoskeletal:     Cervical back: Neck supple.  Skin:    General: Skin is warm.     Capillary Refill: Capillary refill takes less than 2 seconds.  Neurological:     Mental Status: She is alert. Mental status is at baseline.  Psychiatric:        Behavior: Behavior normal.        Thought Content: Thought content normal.        Judgment: Judgment normal.     Ortho Exam Exam of the left foot shows loss of the arch.  She is insensate to the left foot.  It is warm and well-perfused.  I do not see any cellulitis.  There is a quarter sized dry scab on the plantar  aspect of the midfoot.  I can palpate the midfoot bones just underneath the skin.  There is no exposed bone. Specialty Comments:  No specialty comments available.  Imaging: XR Foot Complete Left  Result Date: 03/14/2023 X-rays of the left foot shows degenerative changes of the midfoot.  There is loss of midfoot arch consistent with Charcot collapse.    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/16/2018   Vaginal prolapse 09/30/2018   Primary osteoarthritis of left hip 06/10/2018   Osteomyelitis of second toe of right foot (HCC) 04/24/2018   Osteomyelitis of right foot (HCC)  04/21/2018   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) 04/17/2018   Callus 01/13/2018   Generalized anxiety disorder 10/16/2017   Menopause 10/16/2017   Diabetes mellitus, type II (HCC) 10/16/2017   Hyperlipidemia associated with type 2 diabetes mellitus (HCC) 10/16/2017   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) 10/14/2017   Osteomyelitis of great toe of right foot (HCC) 09/25/2017   Pain in right foot 09/16/2017   Cellulitis 08/08/2017   Hypoglycemia without diagnosis of diabetes mellitus 08/08/2017   Peripheral neuropathy 08/08/2017   Abscess 08/08/2017   Hyperlipidemia LDL goal <70 09/22/2016   Tooth pain 09/03/2012   Edema 09/03/2012   Supraclavicular fossa fullness 06/18/2012   Hair loss 09/18/2010   Fatigue 09/18/2010   DYSPEPSIA&OTHER SPEC DISORDERS FUNCTION STOMACH 05/08/2010   DIARRHEA 05/08/2010   DIZZINESS 04/19/2010   OTHER ACUTE SINUSITIS 02/28/2010   NAUSEA 02/28/2010   Pain in right ankle and joints of right foot 02/15/2010   URI 07/09/2007   GERD 02/12/2007   LOW BACK PAIN 02/12/2007   Hypertension 09/03/2006   HOT FLASHES 09/03/2006   INSOMNIA 09/03/2006   Past Medical History:  Diagnosis Date   Anemia    as a younger woman   Anxiety    Arthritis    "all over my body" (10/01/2017)   Chronic pain    In pain clinic    Constipation    Depression    Diabetic peripheral neuropathy (HCC)    GERD (gastroesophageal reflux disease)    High cholesterol    History of hiatal hernia    Hypertension    Type II diabetes mellitus (HCC) dx'd ~ 2008    Family History  Problem Relation Age of Onset   Cancer Mother        breast   Depression Mother        bipolar   Breast cancer Mother 58   Heart disease Father 39       MI   Diabetes Brother    Kidney disease Brother    Hypertension Brother    Hyperlipidemia Brother    Stroke Brother    Coronary artery disease Other    Diabetes Other    Colon cancer Neg Hx    Colon  polyps Neg Hx    Esophageal cancer Neg Hx    Rectal cancer Neg Hx    Stomach cancer Neg Hx     Past Surgical History:  Procedure Laterality Date   AMPUTATION Right 09/29/2017  Procedure: AMPUTATION RIGHT 1ST RAY;  Surgeon: Tarry Kos, MD;  Location: [facility] OR;  Service: Orthopedics;  Laterality: Right;   AMPUTATION Right 01/29/2019   Procedure: RIGHT TRANSMETATARSAL AMPUTATION;  Surgeon: Nadara Mustard, MD;  Location: [facility] OR;  Service: Orthopedics;  Laterality: Right;   AMPUTATION TOE Right 04/24/2018   Procedure: RIGHT 2ND TOE PROXIMAL INTERPHALANGEAL JOINT DISARTICULATION;  Surgeon: Tarry Kos, MD;  Location: [facility];  Service: Orthopedics;  Laterality: Right;   APPLICATION OF WOUND VAC Right 09/29/2017   "foot"   BLADDER SUSPENSION  07/13/2020   Dr Ashley Royalty    COLONOSCOPY     I & D EXTREMITY Right 08/10/2017   Procedure: IRRIGATION AND DEBRIDEMENT FOOT;  Surgeon: Tarry Kos, MD;  Location: [facility] OR;  Service: Orthopedics;  Laterality: Right;   LUMBAR DISC SURGERY  1992   POLYPECTOMY     POSTERIOR LUMBAR FUSION  1996   TOE SURGERY Right 1980s   bone removed "inbetween my toes"   TOTAL HIP ARTHROPLASTY Left 11/16/2018   Procedure: LEFT TOTAL HIP ARTHROPLASTY ANTERIOR APPROACH;  Surgeon: Tarry Kos, MD;  Location: [facility] OR;  Service: Orthopedics;  Laterality: Left;   TOTAL HIP ARTHROPLASTY Right 07/23/2021   Procedure: RIGHT TOTAL HIP REPLACEMENT;  Surgeon: Tarry Kos, MD;  Location: [facility] OR;  Service: Orthopedics;  Laterality: Right;   VAGINAL HYSTERECTOMY     Social History   Occupational History   Not on file  Tobacco Use   Smoking status: Never   Smokeless tobacco: Never  Vaping Use   Vaping status: Never Used  Substance and Sexual Activity   Alcohol use: Never   Drug use: Never   Sexual activity: Not Currently    Partners: Male

## 2023-03-17 ENCOUNTER — Ambulatory Visit: Payer: Medicare HMO | Admitting: Orthopedic Surgery

## 2023-03-17 DIAGNOSIS — M79672 Pain in left foot: Secondary | ICD-10-CM | POA: Diagnosis not present

## 2023-03-17 DIAGNOSIS — L97521 Non-pressure chronic ulcer of other part of left foot limited to breakdown of skin: Secondary | ICD-10-CM | POA: Diagnosis not present

## 2023-03-17 DIAGNOSIS — M76822 Posterior tibial tendinitis, left leg: Secondary | ICD-10-CM

## 2023-03-18 ENCOUNTER — Encounter: Payer: Self-pay | Admitting: Orthopedic Surgery

## 2023-03-18 NOTE — Progress Notes (Signed)
Office Visit Note   Patient: Brittany [name]           Date of Birth: 10-04-1945           MRN: [id number] Visit Date: 03/17/2023              Requested by: Zola Button, [place], [place] [address] [place],  [place] [address] PCP: Zola Button, Grayling Congress, DO  Chief Complaint  Patient presents with   Left Foot - Pain      HPI: Patient is a 77 year old woman who is seen for evaluation of ulceration to the mid foot on the left.  Patient has been on doxycycline twice a day.  Past medical history negative for tobacco use positive for type 2 diabetes.  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  Assessment & Plan: Visit Diagnoses:  1. Pain in left foot   2. Non-pressure chronic ulcer of other part of left foot limited to breakdown of skin (HCC)   3. Posterior tibial tendon dysfunction (PTTD) of left lower extremity     Plan: Will obtain ankle-brachial indices.  Patient will complete her doxycycline.  She is placed in a postoperative shoe with a felt relieving donut.  Follow-Up Instructions: Return in about 4 weeks (around 04/14/2023).   Ortho Exam  Patient is alert, oriented, no adenopathy, well-dressed, normal affect, normal respiratory effort. Examination patient does not have a palpable dorsalis pedis pulse.  The Doppler was used and she has a monophasic dorsalis pedis and posterior tibial pulse.  She has chronic posterior tibial tendon sufficiency with a pronated valgus foot and collapse of the midfoot.  On the plantar aspect of the collapse she has a Wagner grade 1 ulcer.  After informed consent a 10 blade knife was used to debride the skin and soft tissue back to healthy viable tissue.  The ulcer is 3 cm in diameter and 2 mm deep after debridement.  There is no exposed bone or tendon no tunneling no cellulitis no drainage.  Imaging: No results found. No images are attached to the  encounter.  Labs: Lab Results  Component Value Date   HGBA1C 6.8 (H) 09/03/2022   HGBA1C 7.0 (H) 05/21/2022   HGBA1C 6.6 (H) 03/12/2022   REPTSTATUS 08/15/2017 FINAL 08/10/2017   GRAMSTAIN NO WBC SEEN NO ORGANISMS SEEN  08/10/2017   CULT  08/10/2017    No growth aerobically or anaerobically. Performed at [facility], [address] [place], [place] [address]    Imelda Pillow  03/11/2016    Three or more organisms present,each greater than 10,000 CFU/mL.These organisms,commonly found on external and internal genitalia,are considered to be colonizers.No further testing performed.      Lab Results  Component Value Date   ALBUMIN 3.9 09/03/2022   ALBUMIN 3.9 05/21/2022   ALBUMIN 4.3 03/12/2022    No results found for: "MG" No results found for: "VD25OH"  No results found for: "PREALBUMIN"    Latest Ref Rng & Units 05/21/2022    1:49 PM 12/06/2021    8:23 AM 07/24/2021    2:34 PM  CBC EXTENDED  WBC 4.0 - 10.5 K/uL 7.5  8.1  17.8   RBC 3.87 - 5.11 Mil/uL 4.96  4.96  4.38   Hemoglobin 12.0 - 15.0 g/dL 63.8  75.6  43.3   HCT 36.0 - 46.0 % 40.1  36.7  36.9   Platelets 150.0 - 400.0  K/uL 228.0  255.0  220   NEUT# 1.4 - 7.7 K/uL 4.5  5.8    Lymph# 0.7 - 4.0 K/uL 2.1  1.5       There is no height or weight on file to calculate BMI.  Orders:  Orders Placed This Encounter  Procedures   VAS [place] ABI WITH/WO TBI   No orders of the defined types were placed in this encounter.    Procedures: No procedures performed  Clinical Data: No additional findings.  ROS:  All other systems negative, except as noted in the HPI. Review of Systems  Objective: Vital Signs: There were no vitals taken for this visit.  Specialty Comments:  No specialty comments available.  PMFS History: Patient Active Problem List   Diagnosis Date Noted   Preventative health care 05/21/2022   Primary osteoarthritis of right hip 07/23/2021   Degenerative joint disease of right hip  07/23/2021   Status post total replacement of right hip 07/23/2021   Abdominal bloating 10/26/2019   Ulcer of toe of left foot, limited to breakdown of skin (HCC) 07/13/2019   Status post total replacement of left hip 11/16/2018   Vaginal prolapse 09/30/2018   Primary osteoarthritis of left hip 06/10/2018   Osteomyelitis of second toe of right foot (HCC) 04/24/2018   Osteomyelitis of right foot (HCC) 04/21/2018   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) 04/17/2018   Callus 01/13/2018   Generalized anxiety disorder 10/16/2017   Menopause 10/16/2017   Diabetes mellitus, type II (HCC) 10/16/2017   Hyperlipidemia associated with type 2 diabetes mellitus (HCC) 10/16/2017   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) 10/14/2017   Osteomyelitis of great toe of right foot (HCC) 09/25/2017   Pain in right foot 09/16/2017   Cellulitis 08/08/2017   Hypoglycemia without diagnosis of diabetes mellitus 08/08/2017   Peripheral neuropathy 08/08/2017   Abscess 08/08/2017   Hyperlipidemia LDL goal <70 09/22/2016   Tooth pain 09/03/2012   Edema 09/03/2012   Supraclavicular fossa fullness 06/18/2012   Hair loss 09/18/2010   Fatigue 09/18/2010   DYSPEPSIA&OTHER SPEC DISORDERS FUNCTION STOMACH 05/08/2010   DIARRHEA 05/08/2010   DIZZINESS 04/19/2010   OTHER ACUTE SINUSITIS 02/28/2010   NAUSEA 02/28/2010   Pain in right ankle and joints of right foot 02/15/2010   URI 07/09/2007   GERD 02/12/2007   LOW BACK PAIN 02/12/2007   Hypertension 09/03/2006   HOT FLASHES 09/03/2006   INSOMNIA 09/03/2006   Past Medical History:  Diagnosis Date   Anemia    as a younger woman   Anxiety    Arthritis    "all over my body" (10/01/2017)   Chronic pain    In pain clinic    Constipation    Depression    Diabetic peripheral neuropathy (HCC)    GERD (gastroesophageal reflux disease)    High cholesterol    History of hiatal hernia    Hypertension    Type II diabetes  mellitus (HCC) dx'd ~ 2008    Family History  Problem Relation Age of Onset   Cancer Mother        breast   Depression Mother        bipolar   Breast cancer Mother 73   Heart disease Father 83       MI   Diabetes Brother    Kidney disease Brother    Hypertension Brother    Hyperlipidemia Brother    Stroke Brother  Coronary artery disease Other    Diabetes Other    Colon cancer Neg Hx    Colon polyps Neg Hx    Esophageal cancer Neg Hx    Rectal cancer Neg Hx    Stomach cancer Neg Hx     Past Surgical History:  Procedure Laterality Date   AMPUTATION Right 09/29/2017   Procedure: AMPUTATION RIGHT 1ST RAY;  Surgeon: Tarry Kos, MD;  [facility];  Service: Orthopedics;  Laterality: Right;   AMPUTATION Right 01/29/2019   Procedure: RIGHT TRANSMETATARSAL AMPUTATION;  Surgeon: Nadara Mustard, MD;  Location: [facility] OR;  Service: Orthopedics;  Laterality: Right;   AMPUTATION TOE Right 04/24/2018   Procedure: RIGHT 2ND TOE PROXIMAL INTERPHALANGEAL JOINT DISARTICULATION;  Surgeon: Tarry Kos, MD;  Location: [facility];  Service: Orthopedics;  Laterality: Right;   APPLICATION OF WOUND VAC Right 09/29/2017   "foot"   BLADDER SUSPENSION  07/13/2020   Dr Ashley Royalty    COLONOSCOPY     I & D EXTREMITY Right 08/10/2017   Procedure: IRRIGATION AND DEBRIDEMENT FOOT;  Surgeon: Tarry Kos, MD;  [facility];  Service: Orthopedics;  Laterality: Right;   LUMBAR DISC SURGERY  1992   POLYPECTOMY     POSTERIOR LUMBAR FUSION  1996   TOE SURGERY Right 1980s   bone removed "inbetween my toes"   TOTAL HIP ARTHROPLASTY Left 11/16/2018   Procedure: LEFT TOTAL HIP ARTHROPLASTY ANTERIOR APPROACH;  Surgeon: Tarry Kos, MD;  [facility];  Service: Orthopedics;  Laterality: Left;   TOTAL HIP ARTHROPLASTY Right 07/23/2021   Procedure: RIGHT TOTAL HIP REPLACEMENT;  Surgeon: Tarry Kos, MD;  [facility];  Service: Orthopedics;  Laterality: Right;   VAGINAL HYSTERECTOMY      Social History   Occupational History   Not on file  Tobacco Use   Smoking status: Never   Smokeless tobacco: Never  Vaping Use   Vaping status: Never Used  Substance and Sexual Activity   Alcohol use: Never   Drug use: Never   Sexual activity: Not Currently    Partners: Male

## 2023-03-20 ENCOUNTER — Telehealth: Payer: Self-pay | Admitting: Family Medicine

## 2023-03-20 NOTE — Telephone Encounter (Signed)
Copied from CRM [id number]. Topic: Medicare AWV >> Mar 20, 2023 11:28 AM Payton Doughty wrote: Reason for CRM: Called LVM 03/20/2023 to schedule AWV TELEHEALTH ONLY  Verlee Rossetti; Care Guide Ambulatory Clinical Support [place] [facility] Direct Dial: [phone]

## 2023-03-28 ENCOUNTER — Other Ambulatory Visit: Payer: Self-pay | Admitting: Family Medicine

## 2023-03-28 MED ORDER — DAPAGLIFLOZIN PROPANEDIOL 10 MG PO TABS: 10.0000 mg | ORAL_TABLET | Freq: Every day | ORAL | 1 refills | Status: DC

## 2023-03-28 NOTE — Telephone Encounter (Signed)
Copied from CRM [id number]. Topic: Clinical - Medication Refill >> Mar 28, 2023  9:53 AM Gaetano Hawthorne wrote: Most Recent Primary Care Visit:  Provider: [facility]  [facility]  Visit Type: LAB  Date: 09/03/2022  Medication: dapagliflozin propanediol (FARXIGA) 10 MG TABS tablet  Has the patient contacted their pharmacy? No (Agent: If no, request that the patient contact the pharmacy for the refill. If patient does not wish to contact the pharmacy document the reason why and proceed with request.) (Agent: If yes, when and what did the pharmacy advise?)  Is this the correct pharmacy for this prescription? Yes If no, delete pharmacy and type the correct one.  This is the patient's preferred pharmacy:  [facility] [address] [place] - [address] [place] [address] Phone: [phone] Fax: [phone]    Has the prescription been filled recently? No  Is the patient out of the medication? No  Has the patient been seen for an appointment in the last year OR does the patient have an upcoming appointment? Yes  Can we respond through MyChart? No  Agent: Please be advised that Rx refills may take up to 3 business days. We ask that you follow-up with your pharmacy.

## 2023-04-10 ENCOUNTER — Telehealth (HOSPITAL_COMMUNITY): Payer: Self-pay

## 2023-04-10 NOTE — Telephone Encounter (Signed)
 Attempted to contact the following patient three times over three weeks to attempt to schedule their VAS US  order. Unable to reach patient for scheduling purposes, will inform referring office. If i do not hear back from the patient in the next 7 days, the order is to be canceled per protocol.  04/10/23 left message, informing referring office unable to contact 04/03/23 left message 03/25/23 left message

## 2023-04-12 ENCOUNTER — Other Ambulatory Visit: Payer: Self-pay | Admitting: Family Medicine

## 2023-04-12 DIAGNOSIS — E1110 Type 2 diabetes mellitus with ketoacidosis without coma: Secondary | ICD-10-CM

## 2023-04-12 DIAGNOSIS — Z780 Asymptomatic menopausal state: Secondary | ICD-10-CM

## 2023-04-12 DIAGNOSIS — F411 Generalized anxiety disorder: Secondary | ICD-10-CM

## 2023-04-12 DIAGNOSIS — E1169 Type 2 diabetes mellitus with other specified complication: Secondary | ICD-10-CM

## 2023-04-12 DIAGNOSIS — I10 Essential (primary) hypertension: Secondary | ICD-10-CM

## 2023-04-12 DIAGNOSIS — R609 Edema, unspecified: Secondary | ICD-10-CM

## 2023-04-12 DIAGNOSIS — K219 Gastro-esophageal reflux disease without esophagitis: Secondary | ICD-10-CM

## 2023-04-14 ENCOUNTER — Ambulatory Visit: Payer: Medicare HMO | Admitting: Orthopedic Surgery

## 2023-06-10 ENCOUNTER — Ambulatory Visit: Payer: Medicare HMO | Admitting: Physician Assistant

## 2023-06-24 ENCOUNTER — Ambulatory Visit: Admitting: Physician Assistant

## 2023-06-27 ENCOUNTER — Other Ambulatory Visit (HOSPITAL_COMMUNITY): Payer: Self-pay

## 2023-06-30 ENCOUNTER — Other Ambulatory Visit (HOSPITAL_COMMUNITY): Payer: Self-pay

## 2023-06-30 MED ORDER — MORPHINE SULFATE ER 30 MG PO TBCR
30.0000 mg | EXTENDED_RELEASE_TABLET | Freq: Two times a day (BID) | ORAL | 0 refills | Status: AC
  Filled 2023-06-30: qty 60, 30d supply, fill #0

## 2023-07-01 ENCOUNTER — Ambulatory Visit: Admitting: Physician Assistant

## 2023-07-07 ENCOUNTER — Ambulatory Visit

## 2023-07-07 ENCOUNTER — Encounter: Admitting: Family Medicine

## 2023-07-09 ENCOUNTER — Other Ambulatory Visit (HOSPITAL_COMMUNITY): Payer: Self-pay

## 2023-07-09 DIAGNOSIS — G894 Chronic pain syndrome: Secondary | ICD-10-CM | POA: Diagnosis not present

## 2023-07-09 DIAGNOSIS — Z79891 Long term (current) use of opiate analgesic: Secondary | ICD-10-CM | POA: Diagnosis not present

## 2023-07-09 DIAGNOSIS — G4700 Insomnia, unspecified: Secondary | ICD-10-CM | POA: Diagnosis not present

## 2023-07-09 DIAGNOSIS — E1142 Type 2 diabetes mellitus with diabetic polyneuropathy: Secondary | ICD-10-CM | POA: Diagnosis not present

## 2023-07-09 MED ORDER — MORPHINE SULFATE ER 30 MG PO TBCR
30.0000 mg | EXTENDED_RELEASE_TABLET | Freq: Two times a day (BID) | ORAL | 0 refills | Status: AC
Start: 2023-07-29 — End: ?
  Filled 2023-07-31: qty 24, 12d supply, fill #0
  Filled 2023-08-01: qty 8, 4d supply, fill #0

## 2023-07-09 MED ORDER — MORPHINE SULFATE ER 30 MG PO TBCR
30.0000 mg | EXTENDED_RELEASE_TABLET | Freq: Two times a day (BID) | ORAL | 0 refills | Status: AC
  Filled 2023-09-22: qty 60, 30d supply, fill #0

## 2023-07-17 ENCOUNTER — Ambulatory Visit: Payer: Self-pay | Admitting: Orthopaedic Surgery

## 2023-07-17 ENCOUNTER — Encounter: Payer: Self-pay | Admitting: Orthopaedic Surgery

## 2023-07-17 DIAGNOSIS — L97521 Non-pressure chronic ulcer of other part of left foot limited to breakdown of skin: Secondary | ICD-10-CM | POA: Diagnosis not present

## 2023-07-17 NOTE — Progress Notes (Signed)
 Office Visit Note   Patient: Brittany [name]           Date of Birth: [birth year]           MRN: [id number] Visit Date: 07/17/2023              Requested by: [address], [place], [place] [address] [place],  [place] [address] PCP: Zola Button, Grayling Congress, DO   Assessment & Plan: Visit Diagnoses:  1. 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  The skin breakdown is only limited to the epidermis and it looks like it is already healing well.  I am not concerned about infection or deeper wound.  I recommend just covering with a Band-Aid.  She has diabetic shoes and orthotics.  She will make an appointment to see Denny Peon about getting her nails trimmed.  Follow-Up Instructions: No follow-ups on file.   Orders:  No orders of the defined types were placed in this encounter.  No orders of the defined types were placed in this encounter.     Procedures: No procedures performed   Clinical Data: No additional findings.   Subjective: Chief Complaint  Patient presents with   Left Foot - Follow-up    HPI Brittany [name] is a 78 year old female well-known to me comes in for evaluation of left foot issue.  She feels like the left foot is more swollen and bigger than the right foot.  She has diabetic neuropathy.  Denies any constitutional symptoms.  She noticed some slight drainage on her sock.  She is an established patient with Dr. Lajoyce Corners. Review of Systems  Constitutional: Negative.   HENT: Negative.    Eyes: Negative.   Respiratory: Negative.    Cardiovascular: Negative.   Endocrine: Negative.   Musculoskeletal: Negative.   Neurological: Negative.   Hematological: Negative.   Psychiatric/Behavioral: Negative.    All other systems reviewed and are negative.    Objective: Vital Signs: There were no vitals taken for this visit.  Physical  Exam Vitals and nursing note reviewed.  Constitutional:      Appearance: She is well-developed.  HENT:     Head: Normocephalic and atraumatic.  Pulmonary:     Effort: Pulmonary effort is normal.  Abdominal:     Palpations: Abdomen is soft.  Musculoskeletal:     Cervical back: Neck supple.  Skin:    General: Skin is warm.     Capillary Refill: Capillary refill takes less than 2 seconds.  Neurological:     Mental Status: She is alert and oriented to person, place, and time.  Psychiatric:        Behavior: Behavior normal.        Thought Content: Thought content normal.        Judgment: Judgment normal.     Ortho Exam Exam of the left foot shows collapsed arch at baseline.  There is a small area of skin breakdown that is limited to the epidermis.  This appears to be a previous blister.  I do not see any deeper wounds than that.  I do not feel any drainage.  There is no evidence of infection. Specialty Comments:  No specialty comments available.  Imaging: No results found.   PMFS History: Patient Active Problem List   Diagnosis Date Noted   Preventative health care 05/21/2022  Primary osteoarthritis of right hip 07/23/2021   Degenerative joint disease of right hip 07/23/2021   Status post total replacement of right hip 07/23/2021   Abdominal bloating 10/26/2019   Ulcer of toe of left foot, limited to breakdown of skin (HCC) 07/13/2019   Status post total replacement of left hip 11/16/2018   Vaginal prolapse 09/30/2018   Primary osteoarthritis of left hip 06/10/2018   Osteomyelitis of second toe of right foot (HCC) 04/24/2018   Osteomyelitis of right foot (HCC) 04/21/2018   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) 04/17/2018   Callus 01/13/2018   Generalized anxiety disorder 10/16/2017   Menopause 10/16/2017   Diabetes mellitus, type II (HCC) 10/16/2017   Hyperlipidemia associated with type 2 diabetes mellitus (HCC) 10/16/2017   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 (HCC) 10/14/2017   Osteomyelitis of great toe of right foot (HCC) 09/25/2017   Pain in right foot 09/16/2017   Cellulitis 08/08/2017   Hypoglycemia without diagnosis of diabetes mellitus 08/08/2017   Peripheral neuropathy 08/08/2017   Abscess 08/08/2017   Hyperlipidemia LDL goal <70 09/22/2016   Tooth pain 09/03/2012   Edema 09/03/2012   Supraclavicular fossa fullness 06/18/2012   Hair loss 09/18/2010   Fatigue 09/18/2010   DYSPEPSIA&OTHER SPEC DISORDERS FUNCTION STOMACH 05/08/2010   DIARRHEA 05/08/2010   DIZZINESS 04/19/2010   OTHER ACUTE SINUSITIS 02/28/2010   NAUSEA 02/28/2010   Pain in right ankle and joints of right foot 02/15/2010   URI 07/09/2007   GERD 02/12/2007   LOW BACK PAIN 02/12/2007   Hypertension 09/03/2006   HOT FLASHES 09/03/2006   INSOMNIA 09/03/2006   Past Medical History:  Diagnosis Date   Anemia    as a younger woman   Anxiety    Arthritis    "all over my body" (10/01/2017)   Chronic pain    In pain clinic    Constipation    Depression    Diabetic peripheral neuropathy (HCC)    GERD (gastroesophageal reflux disease)    High cholesterol    History of hiatal hernia    Hypertension    Type II diabetes mellitus (HCC) dx'd ~ 2008    Family History  Problem Relation Age of Onset   Cancer Mother        breast   Depression Mother        bipolar   Breast cancer Mother 3   Heart disease Father 81       MI   Diabetes Brother    Kidney disease Brother    Hypertension Brother    Hyperlipidemia Brother    Stroke Brother    Coronary artery disease Other    Diabetes Other    Colon cancer Neg Hx    Colon polyps Neg Hx    Esophageal cancer Neg Hx    Rectal cancer Neg Hx    Stomach cancer Neg Hx     Past Surgical History:  Procedure Laterality Date   AMPUTATION Right 09/29/2017   Procedure: AMPUTATION RIGHT 1ST RAY;  Surgeon: Tarry Kos, MD;  [facility];  Service: Orthopedics;  Laterality: Right;    AMPUTATION Right 01/29/2019   Procedure: RIGHT TRANSMETATARSAL AMPUTATION;  Surgeon: Nadara Mustard, MD;  Location[facility] OR;  Service: Orthopedics;  Laterality: Right;   AMPUTATION TOE Right 04/24/2018   Procedure: RIGHT 2ND TOE PROXIMAL INTERPHALANGEAL JOINT DISARTICULATION;  Surgeon: Tarry Kos, MD;  Location: [facility];  Service: Orthopedics;  Laterality: Right;   APPLICATION OF WOUND VAC Right 09/29/2017   "foot"   BLADDER SUSPENSION  07/13/2020   Dr Ashley Royalty    COLONOSCOPY     I & D EXTREMITY Right 08/10/2017   Procedure: IRRIGATION AND DEBRIDEMENT FOOT;  Surgeon: Tarry Kos, MD;  [facility];  Service: Orthopedics;  Laterality: Right;   LUMBAR DISC SURGERY  1992   POLYPECTOMY     POSTERIOR LUMBAR FUSION  1996   TOE SURGERY Right 1980s   bone removed "inbetween my toes"   TOTAL HIP ARTHROPLASTY Left 11/16/2018   Procedure: LEFT TOTAL HIP ARTHROPLASTY ANTERIOR APPROACH;  Surgeon: Tarry Kos, MD;  [facility];  Service: Orthopedics;  Laterality: Left;   TOTAL HIP ARTHROPLASTY Right 07/23/2021   Procedure: RIGHT TOTAL HIP REPLACEMENT;  Surgeon: Tarry Kos, MD;  [facility];  Service: Orthopedics;  Laterality: Right;   VAGINAL HYSTERECTOMY     Social History   Occupational History   Not on file  Tobacco Use   Smoking status: Never   Smokeless tobacco: Never  Vaping Use   Vaping status: Never Used  Substance and Sexual Activity   Alcohol use: Never   Drug use: Never   Sexual activity: Not Currently    Partners: Male

## 2023-07-29 ENCOUNTER — Ambulatory Visit: Admitting: Orthopedic Surgery

## 2023-07-31 ENCOUNTER — Other Ambulatory Visit (HOSPITAL_COMMUNITY): Payer: Self-pay

## 2023-08-01 ENCOUNTER — Other Ambulatory Visit: Payer: Self-pay | Admitting: Family Medicine

## 2023-08-01 ENCOUNTER — Other Ambulatory Visit (HOSPITAL_COMMUNITY): Payer: Self-pay

## 2023-08-01 DIAGNOSIS — I10 Essential (primary) hypertension: Secondary | ICD-10-CM

## 2023-08-01 DIAGNOSIS — E1110 Type 2 diabetes mellitus with ketoacidosis without coma: Secondary | ICD-10-CM

## 2023-08-01 NOTE — Telephone Encounter (Unsigned)
 Copied from CRM [id number]. Topic: Clinical - Medication Refill >> Aug 01, 2023 11:29 AM Jethro Morrison wrote: Most Recent Primary Care Visit:  Provider: [facility]  [facility]  Visit Type: LAB  Date: 09/03/2022  Medication: KLOR-CON  M20 20 MEQ  Has the patient contacted their pharmacy? Yes (Agent: If no, request that the patient contact the pharmacy for the refill. If patient does not wish to contact the pharmacy document the reason why and proceed with request.) (Agent: If yes, when and what did the pharmacy advise?)  Is this the correct pharmacy for this prescription? Yes If no, delete pharmacy and type the correct one.  This is the patient's preferred pharmacy:  [facility] [address] [place] - [address] [place] [address] Phone: [phone] Fax: [phone]     Has the prescription been filled recently? Yes  Is the patient out of the medication? Yes  Has the patient been seen for an appointment in the last year OR does the patient have an upcoming appointment? Yes  Can we respond through MyChart? Yes  Agent: Please be advised that Rx refills may take up to 3 business days. We ask that you follow-up with your pharmacy.

## 2023-08-01 NOTE — Telephone Encounter (Deleted)
 Copied from CRM [id number]. Topic: Clinical - Medication Refill >> Aug 01, 2023 11:26 AM Jethro Morrison wrote: Most Recent Primary Care Visit:  Provider: [facility]  [facility]  Visit Type: LAB  Date: 09/03/2022  Medication: KLOR-CON  M20 20 MEQ  Has the patient contacted their pharmacy? Yes (Agent: If no, request that the patient contact the pharmacy for the refill. If patient does not wish to contact the pharmacy document the reason why and proceed with request.) (Agent: If yes, when and what did the pharmacy advise?)  Is this the correct pharmacy for this prescription? Yes If no, delete pharmacy and type the correct one.  This is the patient's preferred pharmacy:  [facility] [address] [place] - [address] [place] [address] Phone: [phone] Fax: [phone]  [facility] [place], [place] - [facility] AT Portal to Registered Caremark Sites [address] [place] [address] Phone: [phone] Fax: [phone]   Has the prescription been filled recently? Yes  Is the patient out of the medication? Yes  Has the patient been seen for an appointment in the last year OR does the patient have an upcoming appointment? Yes  Can we respond through MyChart? No  Agent: Please be advised that Rx refills may take up to 3 business days. We ask that you follow-up with your pharmacy.

## 2023-08-01 NOTE — Telephone Encounter (Signed)
 Duplicate request

## 2023-08-01 NOTE — Telephone Encounter (Signed)
 Copied from CRM [id number]. Topic: Clinical - Medication Refill >> Aug 01, 2023 10:59 AM Turkey A wrote: Most Recent Primary Care Visit:  Provider: [facility]  [facility]  Visit Type: LAB  Date: 09/03/2022  Medication: JANUMET  50-1000 MG tablet  Has the patient contacted their pharmacy? Yes (Agent: If no, request that the patient contact the pharmacy for the refill. If patient does not wish to contact the pharmacy document the reason why and proceed with request.) (Agent: If yes, when and what did the pharmacy advise?)  Is this the correct pharmacy for this prescription? Yes If no, delete pharmacy and type the correct one.  This is the patient's preferred pharmacy:   [facility] [address] [place], [place] [address] Phone: [phone] Fsx: [phone]  [facility] [address], [place] - [address] [place] [address] Phone: [phone] Fax: [phone]  [facility] - [place], [place] - One [facility] AT Portal to Registered Caremark Sites [address] [place] [address] Phone: [phone] Fax: [phone]   Has the prescription been filled recently? No  Is the patient out of the medication? Yes  Has the patient been seen for an appointment in the last year OR does the patient have an upcoming appointment? Yes  Can we respond through MyChart? Yes  Agent: Please be advised that Rx refills may take up to 3 business days. We ask that you follow-up with your pharmacy.

## 2023-08-04 ENCOUNTER — Ambulatory Visit: Admitting: Orthopedic Surgery

## 2023-08-12 ENCOUNTER — Ambulatory Visit: Admitting: Orthopedic Surgery

## 2023-08-18 ENCOUNTER — Other Ambulatory Visit (HOSPITAL_COMMUNITY): Payer: Self-pay

## 2023-08-20 ENCOUNTER — Ambulatory Visit: Admitting: Family

## 2023-08-20 ENCOUNTER — Encounter: Payer: Self-pay | Admitting: Family

## 2023-08-20 ENCOUNTER — Other Ambulatory Visit: Payer: Self-pay

## 2023-08-20 ENCOUNTER — Other Ambulatory Visit (HOSPITAL_COMMUNITY): Payer: Self-pay

## 2023-08-20 DIAGNOSIS — L97521 Non-pressure chronic ulcer of other part of left foot limited to breakdown of skin: Secondary | ICD-10-CM

## 2023-08-20 MED ORDER — TERBINAFINE HCL 250 MG PO TABS
250.0000 mg | ORAL_TABLET | Freq: Every day | ORAL | 0 refills | Status: AC
Start: 2023-08-20 — End: ?

## 2023-08-20 MED ORDER — MORPHINE SULFATE ER 30 MG PO TBCR
30.0000 mg | EXTENDED_RELEASE_TABLET | Freq: Two times a day (BID) | ORAL | 0 refills | Status: AC
Start: 2023-08-20 — End: ?
  Filled 2023-08-20: qty 60, 30d supply, fill #0

## 2023-08-20 NOTE — Progress Notes (Signed)
 Office Visit Note   Patient: Brittany [name]           Date of Birth: Feb 16, 1946           MRN: [id number] Visit Date: 08/20/2023              Requested by: Crecencio Dodge, [place], [place] [address] [place],  [place] [address] PCP: Crecencio Dodge, Candida Chalk, DO  Chief Complaint  Patient presents with   Left Foot - Wound Check      HPI: The patient is a 78 year old woman who presents today for evaluation of left foot.  She has thickened and discolored painful toenail on the left great toe as well as 2nd and 3rd toes left foot she also has a Wagner grade 1 ulcer beneath the midfoot of the left foot.  She does have a history of Charcot arthropathy as well as diabetic insensate neuropathy.  Her last hemoglobin A1c was 6.8  Assessment & Plan: Visit Diagnoses: No diagnosis found.  Plan: Upon reviewing the chart liver enzymes were within normal limits at her last primary care visit.  Will place her on Lamisil for onychomycosis  She will continue with close monitoring of the plantar ulcer on the left protective shoe wear follow-up in the office in 4 weeks for reevaluation  Follow-Up Instructions: Return in about 2 weeks (around 09/03/2023).   Ortho Exam  Patient is alert, oriented, no adenopathy, well-dressed, normal affect, normal respiratory effort. Examination left foot pes planovalgus with loss of her arch she has a Wagner grade 1 ulcer beneath the midfoot this is 2 cm in diameter there is no surrounding erythema no purulence mild maceration Thickened and discolored onychomycotic nails x 3 she is unable to safely trim her nails these were trimmed today x 3 patient voiced relief.  Imaging: No results found. No images are attached to the encounter.  Labs: Lab Results  Component Value Date   HGBA1C 6.8 (H) 09/03/2022   HGBA1C 7.0 (H) 05/21/2022   HGBA1C 6.6 (H) 03/12/2022   REPTSTATUS 08/15/2017 FINAL 08/10/2017   GRAMSTAIN NO WBC SEEN NO ORGANISMS SEEN  08/10/2017    CULT  08/10/2017    No growth aerobically or anaerobically. Performed at [facility], [address] [place], [place] [address]    Joe Murders  03/11/2016    Three or more organisms present,each greater than 10,000 CFU/mL.These organisms,commonly found on external and internal genitalia,are considered to be colonizers.No further testing performed.      Lab Results  Component Value Date   ALBUMIN 3.9 09/03/2022   ALBUMIN 3.9 05/21/2022   ALBUMIN 4.3 03/12/2022    No results found for: "MG" No results found for: "VD25OH"  No results found for: "PREALBUMIN"    Latest Ref Rng & Units 05/21/2022    1:49 PM 12/06/2021    8:23 AM 07/24/2021    2:34 PM  CBC EXTENDED  WBC 4.0 - 10.5 K/uL 7.5  8.1  17.8   RBC 3.87 - 5.11 Mil/uL 4.96  4.96  4.38   Hemoglobin 12.0 - 15.0 g/dL 16.6  06.3  01.6   HCT 36.0 - 46.0 % 40.1  36.7  36.9   Platelets 150.0 - 400.0 K/uL 228.0  255.0  220   NEUT# 1.4 - 7.7 K/uL 4.5  5.8    Lymph# 0.7 - 4.0 K/uL 2.1  1.5       There is no height or weight on file to calculate  BMI.  Orders:  No orders of the defined types were placed in this encounter.  No orders of the defined types were placed in this encounter.    Procedures: No procedures performed  Clinical Data: No additional findings.  ROS:  All other systems negative, except as noted in the HPI. Review of Systems  Objective: Vital Signs: There were no vitals taken for this visit.  Specialty Comments:  No specialty comments available.  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/16/2018   Vaginal prolapse  09/30/2018   Primary osteoarthritis of left hip 06/10/2018   Osteomyelitis of second toe of right foot (HCC) 04/24/2018   Osteomyelitis of right foot (HCC) 04/21/2018   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) 04/17/2018   Callus 01/13/2018   Generalized anxiety disorder 10/16/2017   Menopause 10/16/2017   Diabetes mellitus, type II (HCC) 10/16/2017   Hyperlipidemia associated with type 2 diabetes mellitus (HCC) 10/16/2017   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) 10/14/2017   Osteomyelitis of great toe of right foot (HCC) 09/25/2017   Pain in right foot 09/16/2017   Cellulitis 08/08/2017   Hypoglycemia without diagnosis of diabetes mellitus 08/08/2017   Peripheral neuropathy 08/08/2017   Abscess 08/08/2017   Hyperlipidemia LDL goal <70 09/22/2016   Tooth pain 09/03/2012   Edema 09/03/2012   Supraclavicular fossa fullness 06/18/2012   Hair loss 09/18/2010   Fatigue 09/18/2010   DYSPEPSIA&OTHER SPEC DISORDERS FUNCTION STOMACH 05/08/2010   DIARRHEA 05/08/2010   DIZZINESS 04/19/2010   OTHER ACUTE SINUSITIS 02/28/2010   NAUSEA 02/28/2010   Pain in right ankle and joints of right foot 02/15/2010   URI 07/09/2007   GERD 02/12/2007   LOW BACK PAIN 02/12/2007   Hypertension 09/03/2006   HOT FLASHES 09/03/2006   INSOMNIA 09/03/2006   Past Medical History:  Diagnosis Date   Anemia    as a younger woman   Anxiety    Arthritis    "all over my body" (10/01/2017)   Chronic pain    In pain clinic    Constipation    Depression    Diabetic peripheral neuropathy (HCC)    GERD (gastroesophageal reflux disease)    High cholesterol    History of hiatal hernia    Hypertension    Type II diabetes mellitus (HCC) dx'd ~ 2008    Family History  Problem Relation Age of Onset   Cancer Mother        breast   Depression Mother        bipolar   Breast cancer Mother 13   Heart disease Father 51       MI   Diabetes Brother    Kidney disease Brother     Hypertension Brother    Hyperlipidemia Brother    Stroke Brother    Coronary artery disease Other    Diabetes Other    Colon cancer Neg Hx    Colon polyps Neg Hx    Esophageal cancer Neg Hx    Rectal cancer Neg Hx    Stomach cancer Neg Hx  Past Surgical History:  Procedure Laterality Date   AMPUTATION Right 09/29/2017   Procedure: AMPUTATION RIGHT 1ST RAY;  Surgeon: Wes Hamman, MD;  Location: [facility] OR;  Service: Orthopedics;  Laterality: Right;   AMPUTATION Right 01/29/2019   Procedure: RIGHT TRANSMETATARSAL AMPUTATION;  Surgeon: Timothy Ford, MD;  Location: [facility] OR;  Service: Orthopedics;  Laterality: Right;   AMPUTATION TOE Right 04/24/2018   Procedure: RIGHT 2ND TOE PROXIMAL INTERPHALANGEAL JOINT DISARTICULATION;  Surgeon: Wes Hamman, MD;  Location: [facility];  Service: Orthopedics;  Laterality: Right;   APPLICATION OF WOUND VAC Right 09/29/2017   "foot"   BLADDER SUSPENSION  07/13/2020   Dr Augustus Ledger    COLONOSCOPY     I & D EXTREMITY Right 08/10/2017   Procedure: IRRIGATION AND DEBRIDEMENT FOOT;  Surgeon: Wes Hamman, MD;  Location: [facility] OR;  Service: Orthopedics;  Laterality: Right;   LUMBAR DISC SURGERY  1992   POLYPECTOMY     POSTERIOR LUMBAR FUSION  1996   TOE SURGERY Right 1980s   bone removed "inbetween my toes"   TOTAL HIP ARTHROPLASTY Left 11/16/2018   Procedure: LEFT TOTAL HIP ARTHROPLASTY ANTERIOR APPROACH;  Surgeon: Wes Hamman, MD;  Location: [facility] OR;  Service: Orthopedics;  Laterality: Left;   TOTAL HIP ARTHROPLASTY Right 07/23/2021   Procedure: RIGHT TOTAL HIP REPLACEMENT;  Surgeon: Wes Hamman, MD;  Location: [facility] OR;  Service: Orthopedics;  Laterality: Right;   VAGINAL HYSTERECTOMY     Social History   Occupational History   Not on file  Tobacco Use   Smoking status: Never   Smokeless tobacco: Never  Vaping Use   Vaping status: Never Used  Substance and Sexual Activity   Alcohol  use: Never   Drug use: Never   Sexual  activity: Not Currently    Partners: Male

## 2023-08-21 ENCOUNTER — Other Ambulatory Visit (HOSPITAL_COMMUNITY): Payer: Self-pay

## 2023-08-27 IMAGING — RF DG HIP (WITH OR WITHOUT PELVIS) 1V*R*
1 series · 4 of 4 positions shown · non-contrast
Comparison: 05/14/2021 right hip radiographs

CLINICAL DATA: Right total hip arthroplasty

EXAM:
DG HIP (WITH OR WITHOUT PELVIS) 1V RIGHT; DG C-ARM 1-60 MIN-NO
REPORT

[Series 1: run · 4 of 4 slices shown]
[im 1/4]
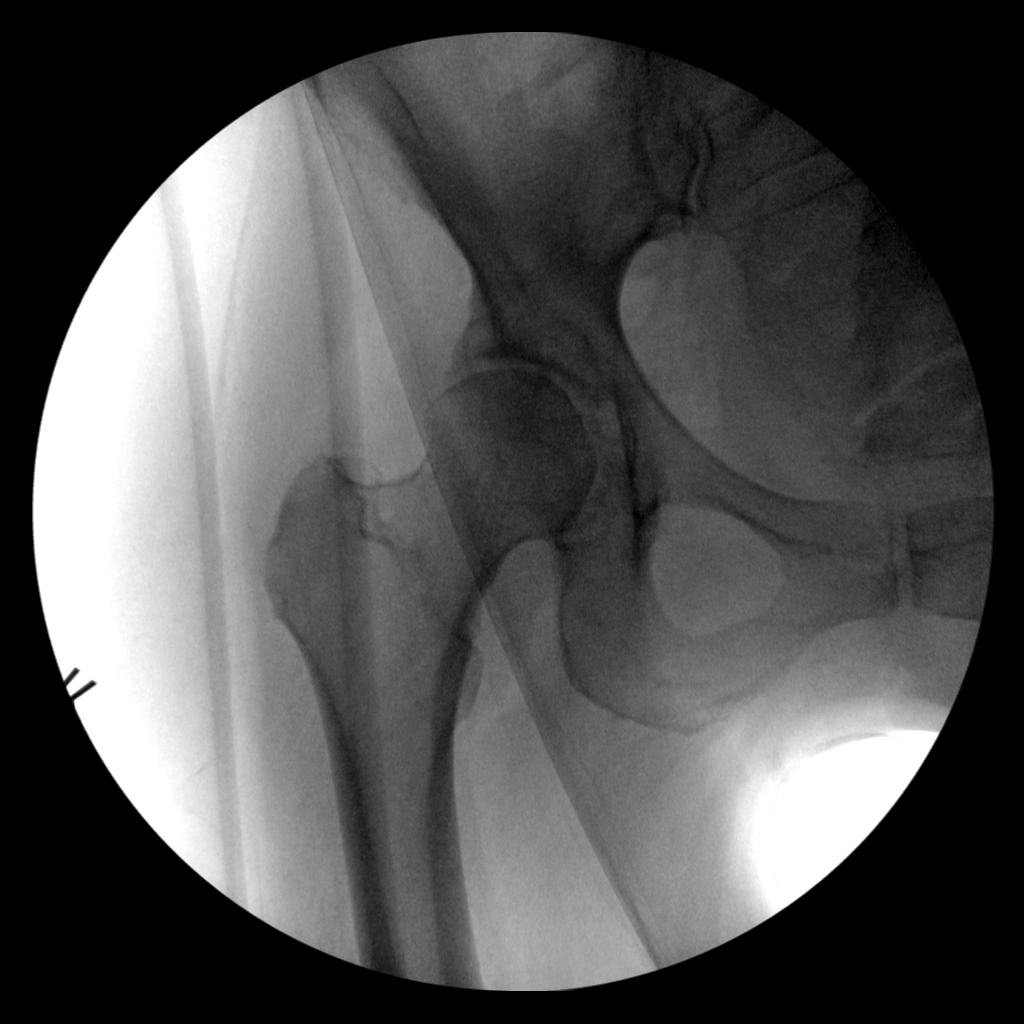
[im 2/4]
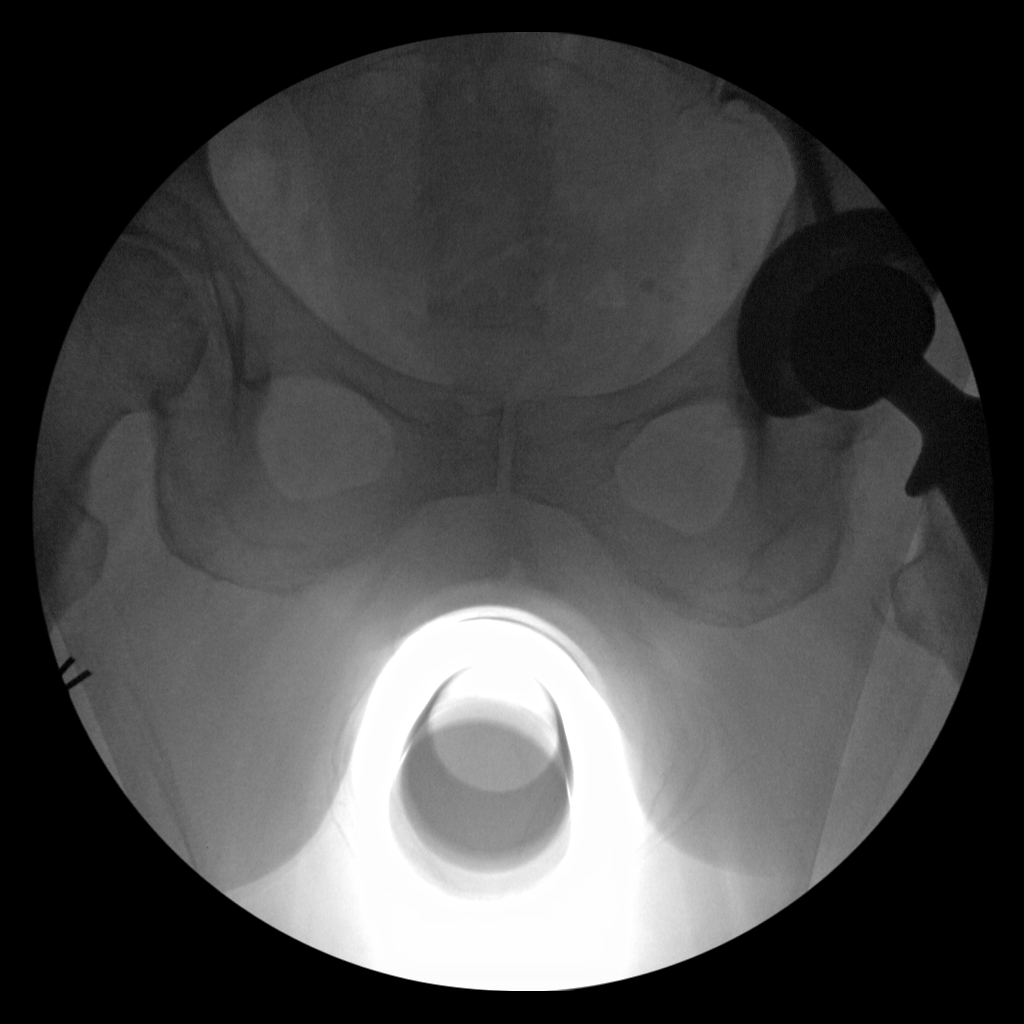
[im 3/4]
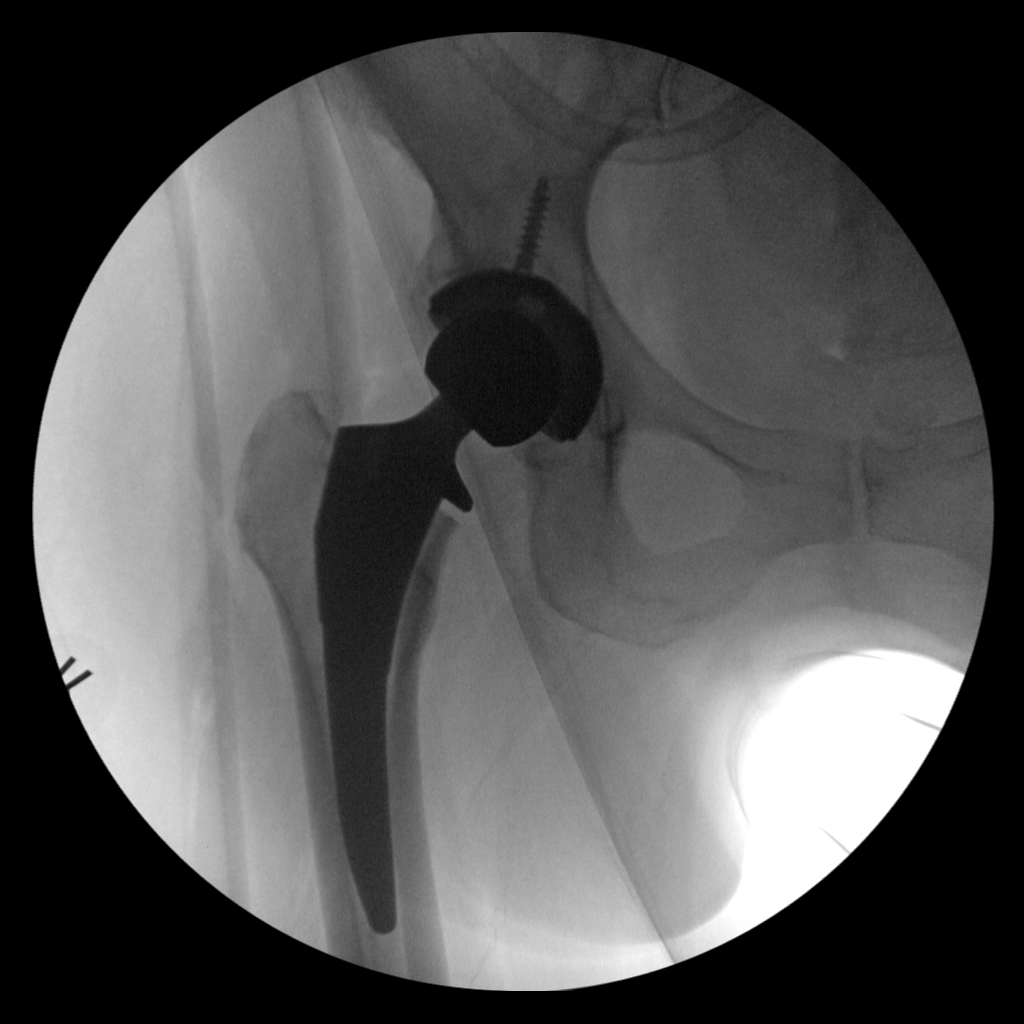
[im 4/4]
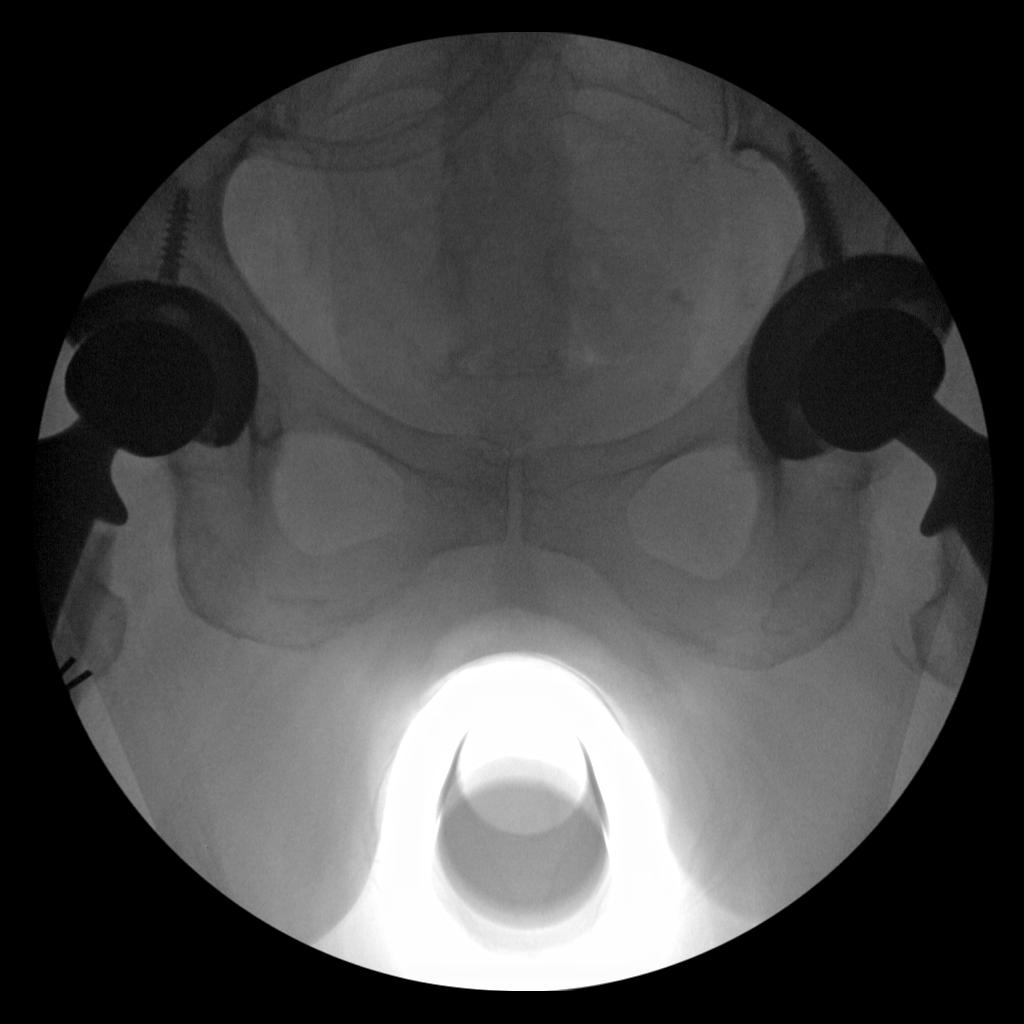

[4 of 4 positions shown; findings below may reference images not displayed]

FLUOROSCOPY TIME:  Radiation Exposure Index (if provided by the
fluoroscopic device): 6.8 mGy
FINDINGS: Nondiagnostic spot fluoroscopic intraoperative right hip radiographs
demonstrate expected postsurgical changes from right total hip
arthroplasty. Previous left total hip arthroplasty. No evidence of
hip malalignment on these views.
IMPRESSION: Intraoperative fluoroscopic guidance for right total hip
arthroplasty.

## 2023-08-27 IMAGING — DX DG PORTABLE PELVIS
1 series · 1 of 1 positions shown · non-contrast
Comparison: 11/16/2018

CLINICAL DATA: Right hip arthroplasty

EXAM:
PORTABLE PELVIS 1-2 VIEWS

[pelvis]
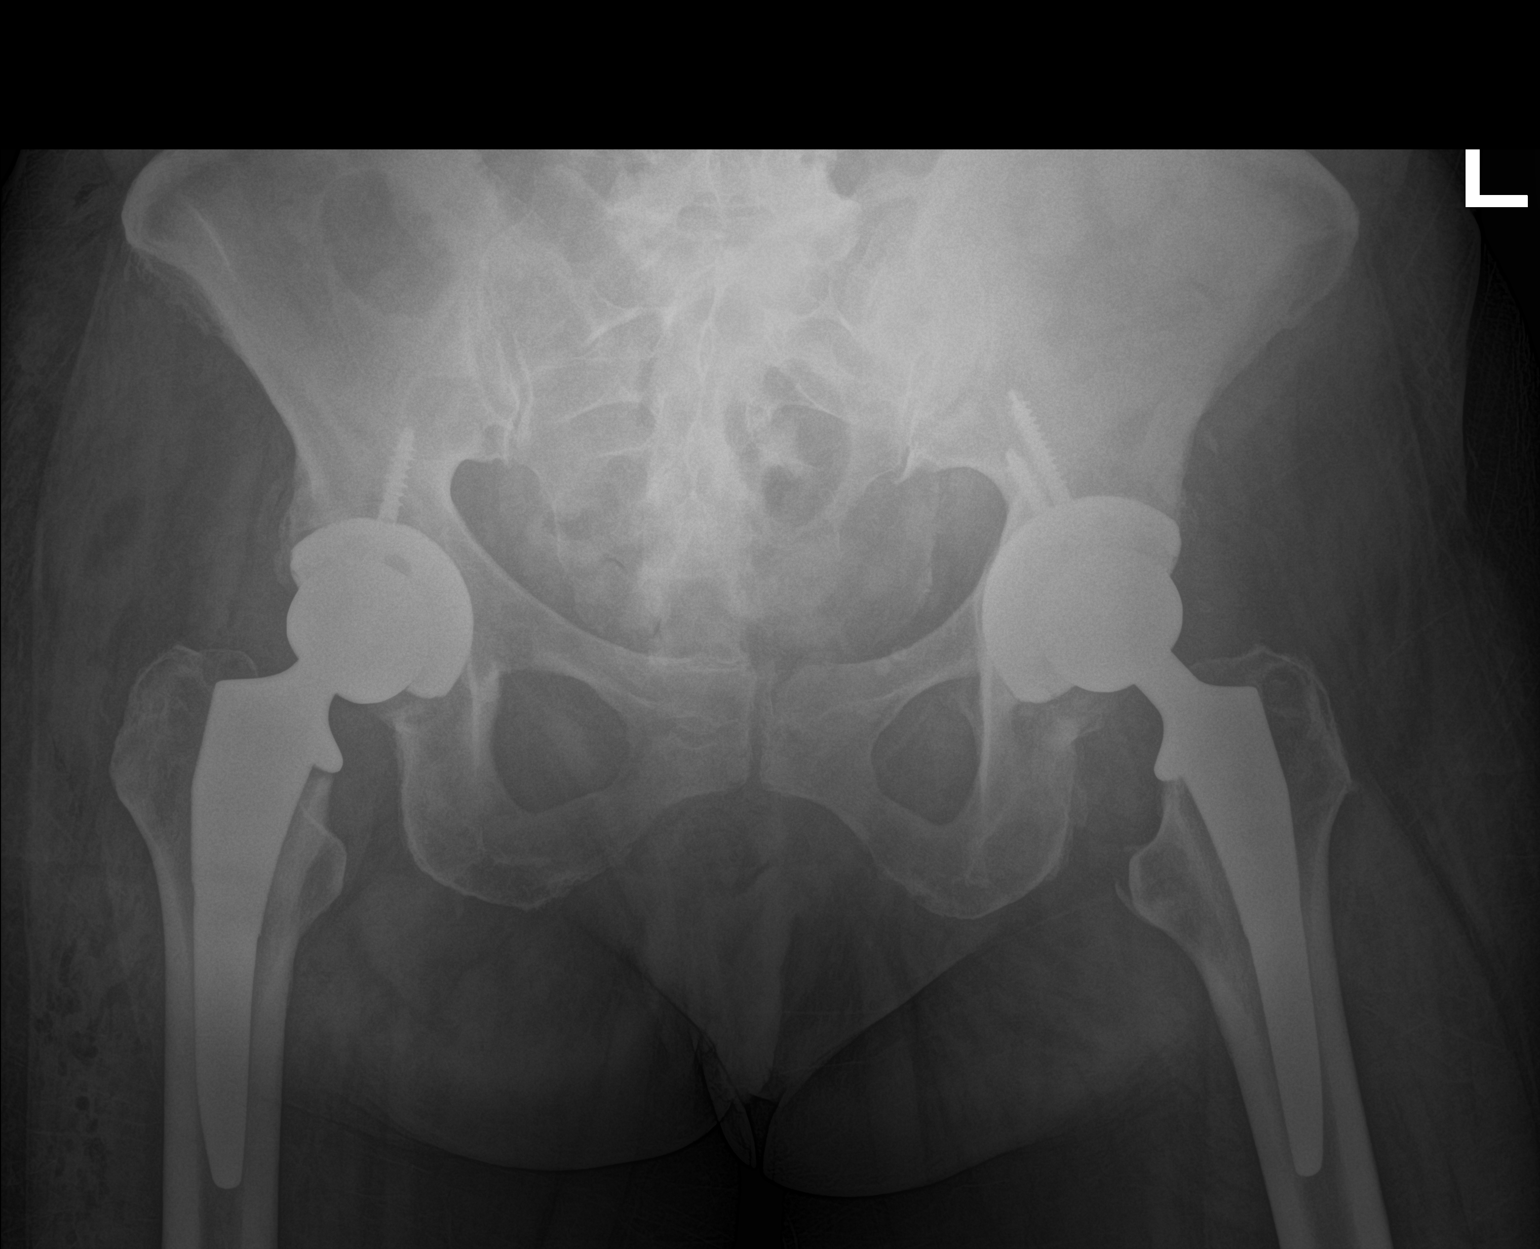

[1 of 1 positions shown; findings below may reference images not displayed]

FINDINGS: Interval postsurgical changes from right total hip arthroplasty.
Arthroplasty components are in their expected alignment. No
periprosthetic fracture or evidence of other complication. Expected
postoperative changes within the overlying soft tissues.
IMPRESSION: Interval postsurgical changes from right total hip arthroplasty.

## 2023-08-29 ENCOUNTER — Other Ambulatory Visit: Payer: Self-pay | Admitting: Family Medicine

## 2023-08-29 DIAGNOSIS — E1110 Type 2 diabetes mellitus with ketoacidosis without coma: Secondary | ICD-10-CM

## 2023-08-29 DIAGNOSIS — E1169 Type 2 diabetes mellitus with other specified complication: Secondary | ICD-10-CM

## 2023-08-29 DIAGNOSIS — F411 Generalized anxiety disorder: Secondary | ICD-10-CM

## 2023-08-29 DIAGNOSIS — I10 Essential (primary) hypertension: Secondary | ICD-10-CM

## 2023-08-29 DIAGNOSIS — Z780 Asymptomatic menopausal state: Secondary | ICD-10-CM

## 2023-08-29 NOTE — Telephone Encounter (Signed)
 Copied from CRM [id number]. Topic: Clinical - Medication Refill >> Aug 29, 2023 12:04 PM Adrionna Y wrote: Medication:  amLODipine  (NORVASC ) 5 MG tablet atorvastatin  (LIPITOR) 10 MG tablet bisoprolol -hydrochlorothiazide  (ZIAC ) 10-6.25 MG tablet doxycycline  (VIBRA -TABS) 100 MG tablet escitalopram  (LEXAPRO ) 10 MG tablet estradiol  (ESTRACE ) 1 MG tablet JANUMET  50-1000 MG tablet KLOR-CON  M20 20 MEQ tablet   Has the patient contacted their pharmacy? Yes (Agent: If no, request that the patient contact the pharmacy for the refill. If patient does not wish to contact the pharmacy document the reason why and proceed with request.) (Agent: If yes, when and what did the pharmacy advise?)  This is the patient's preferred pharmacy:  [facility] [address], [place] - [address] [place] [address] Phone: [phone] Fax: [phone]   Is this the correct pharmacy for this prescription? Yes If no, delete pharmacy and type the correct one.   Has the prescription been filled recently? No  Is the patient out of the medication? Yes  Has the patient been seen for an appointment in the last year OR does the patient have an upcoming appointment? Yes  Can we respond through MyChart? Yes  Agent: Please be advised that Rx refills may take up to 3 business days. We ask that you follow-up with your pharmacy.

## 2023-09-02 ENCOUNTER — Other Ambulatory Visit: Payer: Self-pay | Admitting: Vascular Surgery

## 2023-09-02 ENCOUNTER — Telehealth: Payer: Self-pay

## 2023-09-02 DIAGNOSIS — I10 Essential (primary) hypertension: Secondary | ICD-10-CM

## 2023-09-02 DIAGNOSIS — E1110 Type 2 diabetes mellitus with ketoacidosis without coma: Secondary | ICD-10-CM

## 2023-09-02 DIAGNOSIS — F411 Generalized anxiety disorder: Secondary | ICD-10-CM

## 2023-09-02 DIAGNOSIS — Z780 Asymptomatic menopausal state: Secondary | ICD-10-CM

## 2023-09-02 DIAGNOSIS — E1169 Type 2 diabetes mellitus with other specified complication: Secondary | ICD-10-CM

## 2023-09-02 MED ORDER — ESTRADIOL 1 MG PO TABS: 1.0000 mg | ORAL_TABLET | Freq: Every day | ORAL | 0 refills | Status: DC

## 2023-09-02 MED ORDER — AMLODIPINE BESYLATE 5 MG PO TABS: 5.0000 mg | ORAL_TABLET | Freq: Every day | ORAL | 0 refills | Status: DC

## 2023-09-02 MED ORDER — BISOPROLOL-HYDROCHLOROTHIAZIDE 10-6.25 MG PO TABS: 1.0000 | ORAL_TABLET | Freq: Every day | ORAL | 0 refills | Status: DC

## 2023-09-02 MED ORDER — KLOR-CON M20 20 MEQ PO TBCR: 20.0000 meq | EXTENDED_RELEASE_TABLET | Freq: Every day | ORAL | 0 refills | Status: DC

## 2023-09-02 MED ORDER — JANUMET 50-1000 MG PO TABS: 2.0000 | ORAL_TABLET | Freq: Every day | ORAL | 0 refills | Status: DC

## 2023-09-02 MED ORDER — ESCITALOPRAM OXALATE 10 MG PO TABS: 10.0000 mg | ORAL_TABLET | Freq: Every day | ORAL | 0 refills | Status: DC

## 2023-09-02 MED ORDER — ATORVASTATIN CALCIUM 10 MG PO TABS: 10.0000 mg | ORAL_TABLET | Freq: Every day | ORAL | 0 refills | Status: DC

## 2023-09-02 NOTE — Telephone Encounter (Addendum)
 Last OV 06/2022-Pt scheduled for 09/08/23- will send 30 day supply of maintenance meds

## 2023-09-02 NOTE — Telephone Encounter (Signed)
 Copied from CRM [id number]. Topic: Clinical - Medication Question >> Sep 02, 2023  4:39 PM Dewanda Foots wrote: Reason for CRM: Pt sent a refill request for the following prescriptions on 5/23: amLODipine  (NORVASC ) 5 MG tablet atorvastatin  (LIPITOR) 10 MG tablet bisoprolol -hydrochlorothiazide  (ZIAC ) 10-6.25 MG tablet doxycycline  (VIBRA -TABS) 100 MG tablet escitalopram  (LEXAPRO ) 10 MG tablet estradiol  (ESTRACE ) 1 MG tablet JANUMET  50-1000 MG tablet KLOR-CON  M20 20 MEQ tablet  Has not seen PCP in a while. Would we like to schedule an appt before we can refill these for her? She wants to know what is going on.  Thank you so much!  Patient callback is [phone], if you get vm, you can leave a message.

## 2023-09-02 NOTE — Addendum Note (Signed)
 Addended by: Zian Mohamed D on: 09/02/2023 04:56 PM   Modules accepted: Orders

## 2023-09-03 ENCOUNTER — Encounter: Payer: Self-pay | Admitting: Family

## 2023-09-03 ENCOUNTER — Other Ambulatory Visit (INDEPENDENT_AMBULATORY_CARE_PROVIDER_SITE_OTHER): Payer: Self-pay

## 2023-09-03 ENCOUNTER — Ambulatory Visit: Admitting: Family

## 2023-09-03 DIAGNOSIS — M1712 Unilateral primary osteoarthritis, left knee: Secondary | ICD-10-CM | POA: Diagnosis not present

## 2023-09-03 DIAGNOSIS — M25562 Pain in left knee: Secondary | ICD-10-CM

## 2023-09-03 MED ORDER — METHYLPREDNISOLONE ACETATE 40 MG/ML IJ SUSP
40.0000 mg | INTRAMUSCULAR | Status: AC | PRN
  Administered 2023-09-03: 40 mg via INTRA_ARTICULAR

## 2023-09-03 MED ORDER — LIDOCAINE HCL 1 % IJ SOLN
5.0000 mL | INTRAMUSCULAR | Status: AC | PRN
  Administered 2023-09-03: 5 mL

## 2023-09-03 NOTE — Progress Notes (Signed)
 Office Visit Note   Patient: Brittany [name]           Date of Birth: Jan 08, 1946           MRN: [id number] Visit Date: 09/03/2023              Requested by: [address] [place], [place] [address] [place],  [place] [address] PCP: Crecencio Dodge, Candida Chalk, DO  No chief complaint on file.     HPI: The patient is a 78 year old woman who presents today for evaluation of her left knee this has been gradually worsening pain for over a week now she cannot recall any associated injury she has been having gradually worsening swelling no redness no fever no chills the knee has been having giving way she has lateral pain as well as anterior knee pain she has had loss of range of motion due to pain and swelling  Assessment & Plan: Visit Diagnoses: No diagnosis found.  Plan: Depo-Medrol  injection left knee.  Patient tolerated well.  Follow-Up Instructions: No follow-ups on file.   Left Knee Exam   Tenderness  The patient is experiencing tenderness in the lateral joint line and medial joint line.  Range of Motion  The patient has normal left knee ROM.  Tests  Varus: negative Valgus: negative  Other  Erythema: absent Swelling: mild Effusion: effusion present      Patient is alert, oriented, no adenopathy, well-dressed, normal affect, normal respiratory effort.  Imaging: No results found. No images are attached to the encounter.  Labs: Lab Results  Component Value Date   HGBA1C 6.8 (H) 09/03/2022   HGBA1C 7.0 (H) 05/21/2022   HGBA1C 6.6 (H) 03/12/2022   REPTSTATUS 08/15/2017 FINAL 08/10/2017   GRAMSTAIN NO WBC SEEN NO ORGANISMS SEEN  08/10/2017   CULT  08/10/2017    No growth aerobically or anaerobically. Performed at [facility], [address], [place], [place] [address]    Joe Murders  03/11/2016    Three or more organisms present,each greater than 10,000 CFU/mL.These organisms,commonly found on external and internal genitalia,are considered  to be colonizers.No further testing performed.      Lab Results  Component Value Date   ALBUMIN 3.9 09/03/2022   ALBUMIN 3.9 05/21/2022   ALBUMIN 4.3 03/12/2022    No results found for: "MG" No results found for: "VD25OH"  No results found for: "PREALBUMIN"    Latest Ref Rng & Units 05/21/2022    1:49 PM 12/06/2021    8:23 AM 07/24/2021    2:34 PM  CBC EXTENDED  WBC 4.0 - 10.5 K/uL 7.5  8.1  17.8   RBC 3.87 - 5.11 Mil/uL 4.96  4.96  4.38   Hemoglobin 12.0 - 15.0 g/dL 38.7  56.4  33.2   HCT 36.0 - 46.0 % 40.1  36.7  36.9   Platelets 150.0 - 400.0 K/uL 228.0  255.0  220   NEUT# 1.4 - 7.7 K/uL 4.5  5.8    Lymph# 0.7 - 4.0 K/uL 2.1  1.5       There is no height or weight on file to calculate BMI.  Orders:  No orders of the defined types were placed in this encounter.  No orders of the defined types were placed in this encounter.    Procedures: Large Joint Inj: L knee on 09/03/2023 3:40 PM Indications: pain Details: 18 G 1.5 in needle, anteromedial approach Medications: 5 mL lidocaine  1 %; 40 mg methylPREDNISolone   acetate 40 MG/ML Consent was given by the patient.      Clinical Data: No additional findings.  ROS:  All other systems negative, except as noted in the HPI. Review of Systems  Objective: Vital Signs: There were no vitals taken for this visit.  Specialty Comments:  No specialty comments available.  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/16/2018   Vaginal prolapse 09/30/2018   Primary osteoarthritis of left hip 06/10/2018   Osteomyelitis of second toe of right foot (HCC)  04/24/2018   Osteomyelitis of right foot (HCC) 04/21/2018   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) 04/17/2018   Callus 01/13/2018   Generalized anxiety disorder 10/16/2017   Menopause 10/16/2017   Diabetes mellitus, type II (HCC) 10/16/2017   Hyperlipidemia associated with type 2 diabetes mellitus (HCC) 10/16/2017   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) 10/14/2017   Osteomyelitis of great toe of right foot (HCC) 09/25/2017   Pain in right foot 09/16/2017   Cellulitis 08/08/2017   Hypoglycemia without diagnosis of diabetes mellitus 08/08/2017   Peripheral neuropathy 08/08/2017   Abscess 08/08/2017   Hyperlipidemia LDL goal <70 09/22/2016   Tooth pain 09/03/2012   Edema 09/03/2012   Supraclavicular fossa fullness 06/18/2012   Hair loss 09/18/2010   Fatigue 09/18/2010   DYSPEPSIA&OTHER SPEC DISORDERS FUNCTION STOMACH 05/08/2010   DIARRHEA 05/08/2010   DIZZINESS 04/19/2010   OTHER ACUTE SINUSITIS 02/28/2010   NAUSEA 02/28/2010   Pain in right ankle and joints of right foot 02/15/2010   URI 07/09/2007   GERD 02/12/2007   LOW BACK PAIN 02/12/2007   Hypertension 09/03/2006   HOT FLASHES 09/03/2006   INSOMNIA 09/03/2006   Past Medical History:  Diagnosis Date   Anemia    as a younger woman   Anxiety    Arthritis    "all over my body" (10/01/2017)   Chronic pain    In pain clinic    Constipation    Depression    Diabetic peripheral neuropathy (HCC)    GERD (gastroesophageal reflux disease)    High cholesterol    History of hiatal hernia    Hypertension    Type II diabetes mellitus (HCC) dx'd ~ 2008    Family History  Problem Relation Age of Onset   Cancer Mother        breast   Depression Mother        bipolar   Breast cancer Mother 41   Heart disease Father 75       MI   Diabetes Brother    Kidney disease Brother    Hypertension Brother    Hyperlipidemia Brother    Stroke Brother    Coronary artery disease Other     Diabetes Other    Colon cancer Neg Hx    Colon polyps Neg Hx    Esophageal cancer Neg Hx    Rectal cancer Neg Hx    Stomach cancer Neg Hx     Past Surgical History:  Procedure Laterality Date   AMPUTATION Right 09/29/2017   Procedure: AMPUTATION RIGHT 1ST RAY;  Surgeon: Wes Hamman, MD;  Location: [facility] OR;  Service: Orthopedics;  Laterality: Right;   AMPUTATION Right 01/29/2019   Procedure: RIGHT TRANSMETATARSAL AMPUTATION;  Surgeon: Timothy Ford, MD;  Location: [facility] OR;  Service: Orthopedics;  Laterality: Right;   AMPUTATION TOE Right 04/24/2018   Procedure: RIGHT 2ND TOE PROXIMAL INTERPHALANGEAL JOINT DISARTICULATION;  Surgeon: Wes Hamman, MD;  Location: [facility];  Service: Orthopedics;  Laterality: Right;   APPLICATION OF WOUND VAC Right 09/29/2017   "foot"   BLADDER SUSPENSION  07/13/2020   Dr Augustus Ledger    COLONOSCOPY     I & D EXTREMITY Right 08/10/2017   Procedure: IRRIGATION AND DEBRIDEMENT FOOT;  Surgeon: Wes Hamman, MD;  Location: [facility] OR;  Service: Orthopedics;  Laterality: Right;   LUMBAR DISC SURGERY  1992   POLYPECTOMY     POSTERIOR LUMBAR FUSION  1996   TOE SURGERY Right 1980s   bone removed "inbetween my toes"   TOTAL HIP ARTHROPLASTY Left 11/16/2018   Procedure: LEFT TOTAL HIP ARTHROPLASTY ANTERIOR APPROACH;  Surgeon: Wes Hamman, MD;  Location: [facility] OR;  Service: Orthopedics;  Laterality: Left;   TOTAL HIP ARTHROPLASTY Right 07/23/2021   Procedure: RIGHT TOTAL HIP REPLACEMENT;  Surgeon: Wes Hamman, MD;  Location: [facility] OR;  Service: Orthopedics;  Laterality: Right;   VAGINAL HYSTERECTOMY     Social History   Occupational History   Not on file  Tobacco Use   Smoking status: Never   Smokeless tobacco: Never  Vaping Use   Vaping status: Never Used  Substance and Sexual Activity   Alcohol  use: Never   Drug use: Never   Sexual activity: Not Currently    Partners: Male

## 2023-09-08 ENCOUNTER — Ambulatory Visit: Admitting: Family Medicine

## 2023-09-10 ENCOUNTER — Ambulatory Visit: Admitting: Family

## 2023-09-11 ENCOUNTER — Other Ambulatory Visit (INDEPENDENT_AMBULATORY_CARE_PROVIDER_SITE_OTHER): Payer: Self-pay

## 2023-09-11 ENCOUNTER — Ambulatory Visit: Admitting: Family Medicine

## 2023-09-11 ENCOUNTER — Ambulatory Visit: Admitting: Orthopedic Surgery

## 2023-09-11 DIAGNOSIS — I872 Venous insufficiency (chronic) (peripheral): Secondary | ICD-10-CM

## 2023-09-11 DIAGNOSIS — M25572 Pain in left ankle and joints of left foot: Secondary | ICD-10-CM

## 2023-09-11 DIAGNOSIS — S8265XA Nondisplaced fracture of lateral malleolus of left fibula, initial encounter for closed fracture: Secondary | ICD-10-CM

## 2023-09-12 ENCOUNTER — Telehealth: Payer: Self-pay | Admitting: Family Medicine

## 2023-09-12 ENCOUNTER — Other Ambulatory Visit: Payer: Self-pay | Admitting: *Deleted

## 2023-09-12 DIAGNOSIS — L97521 Non-pressure chronic ulcer of other part of left foot limited to breakdown of skin: Secondary | ICD-10-CM

## 2023-09-12 NOTE — Telephone Encounter (Signed)
 Copied from CRM [id number]. Topic: General - Other >> Sep 12, 2023 10:20 AM Brittany [name] wrote: Reason for CRM: patient calling to inform Dr Crecencio Dodge that patient broke her ankle and will call back to reschedule the appt

## 2023-09-12 NOTE — Telephone Encounter (Signed)
 Noted

## 2023-09-13 ENCOUNTER — Encounter: Payer: Self-pay | Admitting: Orthopedic Surgery

## 2023-09-13 NOTE — Progress Notes (Signed)
 Office Visit Note   Patient: Brittany [name]           Date of Birth: 1946/01/31           MRN: [id number] Visit Date: 09/11/2023              Requested by: Crecencio Dodge, [place], [place] [address] [place],  [place] [address] PCP: Crecencio Dodge, Candida Chalk, DO  Chief Complaint  Patient presents with   Left Foot - Pain   Left Ankle - Pain      HPI: Patient is a 78 year old woman who is seen for initial evaluation for left ankle fracture.  Patient states she fell in the kitchen last week with a twisting injury and felt a pop in her ankle.  Patient states she has had swelling and bruising.  Patient has been ambulating in regular shoewear.  Patient also states she has swelling of her legs.  Assessment & Plan: Visit Diagnoses:  1. Pain in left ankle and joints of left foot   2. Nondisplaced fracture of lateral malleolus of left fibula, initial encounter for closed fracture     Plan: The mortise is congruent and has not displaced with weightbearing.  Will place her in a fracture boot weightbearing as tolerated.  Elevation for the venous insufficiency.  Repeat three-view radiographs of the left ankle at follow-up.  Follow-Up Instructions: No follow-ups on file.   Ortho Exam  Patient is alert, oriented, no adenopathy, well-dressed, normal affect, normal respiratory effort. Examination patient has a good dorsalis pedis pulse.  She has venous stasis insufficiency of both legs with pitting edema and brawny skin color changes.  Patient does have tenderness to palpation over the distal fibula.  The deltoid is not tender to palpation.  There are no open ulcers no drainage no cellulitis.    Imaging: No results found. No images are attached to the encounter.  Labs: Lab Results  Component Value Date   HGBA1C 6.8 (H) 09/03/2022   HGBA1C 7.0 (H) 05/21/2022   HGBA1C 6.6 (H) 03/12/2022   REPTSTATUS 08/15/2017 FINAL 08/10/2017   GRAMSTAIN NO WBC SEEN NO ORGANISMS SEEN   08/10/2017   CULT  08/10/2017    No growth aerobically or anaerobically. Performed at [facility], [address], [place], [place] [address]    Joe Murders  03/11/2016    Three or more organisms present,each greater than 10,000 CFU/mL.These organisms,commonly found on external and internal genitalia,are considered to be colonizers.No further testing performed.      Lab Results  Component Value Date   ALBUMIN 3.9 09/03/2022   ALBUMIN 3.9 05/21/2022   ALBUMIN 4.3 03/12/2022    No results found for: "MG" No results found for: "VD25OH"  No results found for: "PREALBUMIN"    Latest Ref Rng & Units 05/21/2022    1:49 PM 12/06/2021    8:23 AM 07/24/2021    2:34 PM  CBC EXTENDED  WBC 4.0 - 10.5 K/uL 7.5  8.1  17.8   RBC 3.87 - 5.11 Mil/uL 4.96  4.96  4.38   Hemoglobin 12.0 - 15.0 g/dL 84.6  96.2  95.2   HCT 36.0 - 46.0 % 40.1  36.7  36.9   Platelets 150.0 - 400.0 K/uL 228.0  255.0  220   NEUT# 1.4 - 7.7 K/uL 4.5  5.8    Lymph# 0.7 - 4.0 K/uL 2.1  1.5       There is no height or weight on  file to calculate BMI.  Orders:  Orders Placed This Encounter  Procedures   XR Foot 2 Views Left   XR Ankle Complete Left   No orders of the defined types were placed in this encounter.    Procedures: No procedures performed  Clinical Data: No additional findings.  ROS:  All other systems negative, except as noted in the HPI. Review of Systems  Objective: Vital Signs: There were no vitals taken for this visit.  Specialty Comments:  No specialty comments available.  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   Abdominal bloating 10/26/2019   Ulcer of toe of left foot, limited to breakdown of skin (HCC) 07/13/2019   Status post total replacement of  left hip 11/16/2018   Vaginal prolapse 09/30/2018   Primary osteoarthritis of left hip 06/10/2018   Osteomyelitis of second toe of right foot (HCC) 04/24/2018   Osteomyelitis of right foot (HCC) 04/21/2018   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) 04/17/2018   Callus 01/13/2018   Generalized anxiety disorder 10/16/2017   Menopause 10/16/2017   Diabetes mellitus, type II (HCC) 10/16/2017   Hyperlipidemia associated with type 2 diabetes mellitus (HCC) 10/16/2017   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) 10/14/2017   Osteomyelitis of great toe of right foot (HCC) 09/25/2017   Pain in right foot 09/16/2017   Cellulitis 08/08/2017   Hypoglycemia without diagnosis of diabetes mellitus 08/08/2017   Peripheral neuropathy 08/08/2017   Abscess 08/08/2017   Hyperlipidemia LDL goal <70 09/22/2016   Tooth pain 09/03/2012   Edema 09/03/2012   Supraclavicular fossa fullness 06/18/2012   Hair loss 09/18/2010   Fatigue 09/18/2010   DYSPEPSIA&OTHER SPEC DISORDERS FUNCTION STOMACH 05/08/2010   DIARRHEA 05/08/2010   DIZZINESS 04/19/2010   OTHER ACUTE SINUSITIS 02/28/2010   NAUSEA 02/28/2010   Pain in right ankle and joints of right foot 02/15/2010   URI 07/09/2007   GERD 02/12/2007   LOW BACK PAIN 02/12/2007   Hypertension 09/03/2006   HOT FLASHES 09/03/2006   INSOMNIA 09/03/2006   Past Medical History:  Diagnosis Date   Anemia    as a younger woman   Anxiety    Arthritis    "all over my body" (10/01/2017)   Chronic pain    In pain clinic    Constipation    Depression    Diabetic peripheral neuropathy (HCC)    GERD (gastroesophageal reflux disease)    High cholesterol    History of hiatal hernia    Hypertension    Type II diabetes mellitus (HCC) dx'd ~ 2008    Family History  Problem Relation Age of Onset   Cancer Mother        breast   Depression Mother        bipolar   Breast cancer Mother 82   Heart disease Father 85       MI    Diabetes Brother    Kidney disease Brother    Hypertension Brother    Hyperlipidemia Brother    Stroke Brother    Coronary artery disease Other    Diabetes Other    Colon cancer Neg Hx    Colon polyps Neg Hx    Esophageal cancer Neg Hx    Rectal  cancer Neg Hx    Stomach cancer Neg Hx     Past Surgical History:  Procedure Laterality Date   AMPUTATION Right 09/29/2017   Procedure: AMPUTATION RIGHT 1ST RAY;  Surgeon: Wes Hamman, MD;  Location: [facility] OR;  Service: Orthopedics;  Laterality: Right;   AMPUTATION Right 01/29/2019   Procedure: RIGHT TRANSMETATARSAL AMPUTATION;  Surgeon: Timothy Ford, MD;  Location: [facility] OR;  Service: Orthopedics;  Laterality: Right;   AMPUTATION TOE Right 04/24/2018   Procedure: RIGHT 2ND TOE PROXIMAL INTERPHALANGEAL JOINT DISARTICULATION;  Surgeon: Wes Hamman, MD;  Location: [facility];  Service: Orthopedics;  Laterality: Right;   APPLICATION OF WOUND VAC Right 09/29/2017   "foot"   BLADDER SUSPENSION  07/13/2020   Dr Augustus Ledger    COLONOSCOPY     I & D EXTREMITY Right 08/10/2017   Procedure: IRRIGATION AND DEBRIDEMENT FOOT;  Surgeon: Wes Hamman, MD;  Location: [facility] OR;  Service: Orthopedics;  Laterality: Right;   LUMBAR DISC SURGERY  1992   POLYPECTOMY     POSTERIOR LUMBAR FUSION  1996   TOE SURGERY Right 1980s   bone removed "inbetween my toes"   TOTAL HIP ARTHROPLASTY Left 11/16/2018   Procedure: LEFT TOTAL HIP ARTHROPLASTY ANTERIOR APPROACH;  Surgeon: Wes Hamman, MD;  Location: [facility] OR;  Service: Orthopedics;  Laterality: Left;   TOTAL HIP ARTHROPLASTY Right 07/23/2021   Procedure: RIGHT TOTAL HIP REPLACEMENT;  Surgeon: Wes Hamman, MD;  Location: [facility] OR;  Service: Orthopedics;  Laterality: Right;   VAGINAL HYSTERECTOMY     Social History   Occupational History   Not on file  Tobacco Use   Smoking status: Never   Smokeless tobacco: Never  Vaping Use   Vaping status: Never Used  Substance and Sexual Activity   Alcohol   use: Never   Drug use: Never   Sexual activity: Not Currently    Partners: Male

## 2023-09-15 DIAGNOSIS — Z008 Encounter for other general examination: Secondary | ICD-10-CM | POA: Diagnosis not present

## 2023-09-15 DIAGNOSIS — S82892A Other fracture of left lower leg, initial encounter for closed fracture: Secondary | ICD-10-CM | POA: Diagnosis not present

## 2023-09-18 ENCOUNTER — Encounter: Admitting: Vascular Surgery

## 2023-09-18 ENCOUNTER — Encounter (HOSPITAL_COMMUNITY)

## 2023-09-22 ENCOUNTER — Other Ambulatory Visit (HOSPITAL_COMMUNITY): Payer: Self-pay

## 2023-09-23 ENCOUNTER — Encounter (HOSPITAL_COMMUNITY)

## 2023-09-23 ENCOUNTER — Other Ambulatory Visit: Payer: Self-pay

## 2023-09-23 ENCOUNTER — Other Ambulatory Visit (HOSPITAL_COMMUNITY): Payer: Self-pay

## 2023-10-01 ENCOUNTER — Ambulatory Visit: Admitting: Vascular Surgery

## 2023-10-01 ENCOUNTER — Ambulatory Visit (HOSPITAL_COMMUNITY): Admission: RE | Admit: 2023-10-01 | Source: Ambulatory Visit

## 2023-10-02 ENCOUNTER — Other Ambulatory Visit: Payer: Self-pay | Admitting: Family Medicine

## 2023-10-02 DIAGNOSIS — E1110 Type 2 diabetes mellitus with ketoacidosis without coma: Secondary | ICD-10-CM

## 2023-10-03 ENCOUNTER — Other Ambulatory Visit: Payer: Self-pay | Admitting: Family Medicine

## 2023-10-03 ENCOUNTER — Telehealth: Payer: Self-pay | Admitting: Family Medicine

## 2023-10-03 DIAGNOSIS — F411 Generalized anxiety disorder: Secondary | ICD-10-CM

## 2023-10-03 DIAGNOSIS — E1110 Type 2 diabetes mellitus with ketoacidosis without coma: Secondary | ICD-10-CM

## 2023-10-03 DIAGNOSIS — Z780 Asymptomatic menopausal state: Secondary | ICD-10-CM

## 2023-10-03 DIAGNOSIS — E1169 Type 2 diabetes mellitus with other specified complication: Secondary | ICD-10-CM

## 2023-10-03 DIAGNOSIS — I10 Essential (primary) hypertension: Secondary | ICD-10-CM

## 2023-10-03 MED ORDER — ATORVASTATIN CALCIUM 10 MG PO TABS: 10.0000 mg | ORAL_TABLET | Freq: Every day | ORAL | 0 refills | Status: DC

## 2023-10-03 MED ORDER — KLOR-CON M20 20 MEQ PO TBCR
20.0000 meq | EXTENDED_RELEASE_TABLET | Freq: Every day | ORAL | 0 refills | Status: DC
Start: 2023-10-03 — End: 2023-10-06

## 2023-10-03 MED ORDER — JANUMET 50-1000 MG PO TABS
2.0000 | ORAL_TABLET | Freq: Every day | ORAL | 0 refills | Status: DC
Start: 2023-10-03 — End: 2023-10-06

## 2023-10-03 MED ORDER — AMLODIPINE BESYLATE 5 MG PO TABS: 5.0000 mg | ORAL_TABLET | Freq: Every day | ORAL | 0 refills | Status: DC

## 2023-10-03 MED ORDER — BISOPROLOL-HYDROCHLOROTHIAZIDE 10-6.25 MG PO TABS
1.0000 | ORAL_TABLET | Freq: Every day | ORAL | 0 refills | Status: DC
Start: 2023-10-03 — End: 2023-10-06

## 2023-10-03 MED ORDER — ESCITALOPRAM OXALATE 10 MG PO TABS: 10.0000 mg | ORAL_TABLET | Freq: Every day | ORAL | 0 refills | Status: DC

## 2023-10-03 MED ORDER — ESTRADIOL 1 MG PO TABS: 1.0000 mg | ORAL_TABLET | Freq: Every day | ORAL | 0 refills | Status: DC

## 2023-10-03 NOTE — Telephone Encounter (Signed)
 Copied from CRM [id number]. Topic: Clinical - Medication Refill >> Oct 03, 2023 12:45 PM Berneda F wrote: Medication:  sitaGLIPtin -metformin  (JANUMET ) 50-1000 MG tablet amLODipine  (NORVASC ) 5 MG tablet atorvastatin  (LIPITOR) 10 MG tablet bisoprolol -hydrochlorothiazide  (ZIAC ) 10-6.25 MG tablet escitalopram  (LEXAPRO ) 10 MG tablet estradiol  (ESTRACE ) 1 MG tablet KLOR-CON  M20 20 MEQ tablet  Has the patient contacted their pharmacy? Yes (Agent: If no, request that the patient contact the pharmacy for the refill. If patient does not wish to contact the pharmacy document the reason why and proceed with request.) (Agent: If yes, when and what did the pharmacy advise?)  This is the patient's preferred pharmacy:  [facility] [address], [place] - [address] [place] [address] Phone: [phone] Fax: [phone]  Is this the correct pharmacy for this prescription? Yes If no, delete pharmacy and type the correct one.   Has the prescription been filled recently? No  Is the patient out of the medication? No, but she only has 2 left  Has the patient been seen for an appointment in the last year OR does the patient have an upcoming appointment? Yes  Can we respond through MyChart? No  Agent: Please be advised that Rx refills may take up to 3 business days. We ask that you follow-up with your pharmacy.

## 2023-10-03 NOTE — Telephone Encounter (Signed)
 Appt scheduled 10/06/23, 30 day refills sent.

## 2023-10-03 NOTE — Telephone Encounter (Signed)
 Copied from CRM [id number]. Topic: Appointments - Scheduling Inquiry for Clinic >> Oct 03, 2023 12:54 PM Berneda FALCON wrote: Reason for CRM: Patient was told she needs an appt for her medication to be refilled. I scheduled her a video appt but then she let me know she is in a boot and cannot come to the office and does not know how to navigate MyChart. Can this video visit be a phone call appt for her instead? She lost her brother and broke her ankle recently and is unable to drive.If not, we will have to reschedule her appt.  Thank you so much.

## 2023-10-06 ENCOUNTER — Telehealth (INDEPENDENT_AMBULATORY_CARE_PROVIDER_SITE_OTHER): Admitting: Family Medicine

## 2023-10-06 DIAGNOSIS — K219 Gastro-esophageal reflux disease without esophagitis: Secondary | ICD-10-CM

## 2023-10-06 DIAGNOSIS — R609 Edema, unspecified: Secondary | ICD-10-CM | POA: Diagnosis not present

## 2023-10-06 DIAGNOSIS — Z780 Asymptomatic menopausal state: Secondary | ICD-10-CM | POA: Diagnosis not present

## 2023-10-06 DIAGNOSIS — F411 Generalized anxiety disorder: Secondary | ICD-10-CM | POA: Diagnosis not present

## 2023-10-06 DIAGNOSIS — I10 Essential (primary) hypertension: Secondary | ICD-10-CM | POA: Diagnosis not present

## 2023-10-06 DIAGNOSIS — Z7984 Long term (current) use of oral hypoglycemic drugs: Secondary | ICD-10-CM | POA: Diagnosis not present

## 2023-10-06 DIAGNOSIS — E1110 Type 2 diabetes mellitus with ketoacidosis without coma: Secondary | ICD-10-CM

## 2023-10-06 DIAGNOSIS — E785 Hyperlipidemia, unspecified: Secondary | ICD-10-CM | POA: Diagnosis not present

## 2023-10-06 DIAGNOSIS — E1169 Type 2 diabetes mellitus with other specified complication: Secondary | ICD-10-CM | POA: Diagnosis not present

## 2023-10-06 MED ORDER — BISOPROLOL-HYDROCHLOROTHIAZIDE 10-6.25 MG PO TABS: 1.0000 | ORAL_TABLET | Freq: Every day | ORAL | 1 refills | Status: DC

## 2023-10-06 MED ORDER — DAPAGLIFLOZIN PROPANEDIOL 10 MG PO TABS: 10.0000 mg | ORAL_TABLET | Freq: Every day | ORAL | 1 refills | Status: DC

## 2023-10-06 MED ORDER — JANUMET 50-1000 MG PO TABS: 2.0000 | ORAL_TABLET | Freq: Every day | ORAL | 0 refills | Status: DC

## 2023-10-06 MED ORDER — FUROSEMIDE 20 MG PO TABS: 20.0000 mg | ORAL_TABLET | Freq: Every day | ORAL | 1 refills | Status: AC

## 2023-10-06 MED ORDER — KLOR-CON M20 20 MEQ PO TBCR: 20.0000 meq | EXTENDED_RELEASE_TABLET | Freq: Every day | ORAL | 0 refills | Status: DC

## 2023-10-06 MED ORDER — ATORVASTATIN CALCIUM 10 MG PO TABS: 10.0000 mg | ORAL_TABLET | Freq: Every day | ORAL | 1 refills | Status: DC

## 2023-10-06 MED ORDER — OMEPRAZOLE 40 MG PO CPDR: 40.0000 mg | DELAYED_RELEASE_CAPSULE | Freq: Every day | ORAL | 1 refills | Status: AC

## 2023-10-06 MED ORDER — ESTRADIOL 1 MG PO TABS: 1.0000 mg | ORAL_TABLET | Freq: Every day | ORAL | 0 refills | Status: DC

## 2023-10-06 MED ORDER — AMLODIPINE BESYLATE 5 MG PO TABS: 5.0000 mg | ORAL_TABLET | Freq: Every day | ORAL | 1 refills | Status: DC

## 2023-10-06 MED ORDER — ESCITALOPRAM OXALATE 10 MG PO TABS: 10.0000 mg | ORAL_TABLET | Freq: Every day | ORAL | 3 refills | Status: AC

## 2023-10-06 NOTE — Patient Instructions (Signed)

## 2023-10-06 NOTE — Assessment & Plan Note (Signed)
 Well controlled, no changes to meds. Encouraged heart healthy diet such as the DASH diet and exercise as tolerated.

## 2023-10-06 NOTE — Assessment & Plan Note (Signed)
 Tolerating statin, encouraged heart healthy diet, avoid trans fats, minimize simple carbs and saturated fats. Increase exercise as tolerated

## 2023-10-06 NOTE — Progress Notes (Signed)
 MyChart Video Visit    Virtual Visit via Video Note   This patient is at least at moderate risk for complications without adequate follow up. This format is felt to be most appropriate for this patient at this time. Physical exam was limited by quality of the video and audio technology used for the visit. [name] was able to get the patient set up on a video visit.  Patient location: home Patient and provider in visit Provider location: Office  I discussed the limitations of evaluation and management by telemedicine and the availability of in person appointments. The patient expressed understanding and agreed to proceed.  Visit Date: 10/06/2023  Today's healthcare provider: Jamee JONELLE Antonio Cyndee, DO     Subjective:    Patient ID: Brittany [name], female    DOB: 30-Apr-1945, 78 y.o.   MRN: [id number]  No chief complaint on file.   HPI Patient is in today for med f/u.  Discussed the use of AI scribe software for clinical note transcription with the patient, who gave verbal consent to proceed.  History of Present Illness Brittany [name] is a 78 year old female who presents for medication refills and follow-up after an ankle fracture.  She recently sustained an ankle fracture after falling on her kitchen floor, landing on her phone with a metal backing, and her foot got caught underneath her as she fell backwards. She is currently wearing a large boot and is scheduled to follow up on July 3rd to assess the possibility of removing the boot. She has had previous toe amputations on her right foot, which may have contributed to her fall.  She is currently taking gabapentin  and doxepin  as needed, with the doxepin  dose being 10 mg. She usually receives her medications by mail but will need to pick them up from the pharmacy due to a change in her insurance to [facility] starting July 1st.  She has diabetes mellitus and is non-compliant with monitoring her blood sugar levels. She has not  been regularly checking her blood sugar levels.    Past Medical History:  Diagnosis Date   Anemia    as a younger woman   Anxiety    Arthritis    all over my body (10/01/2017)   Chronic pain    In pain clinic    Constipation    Depression    Diabetic peripheral neuropathy (HCC)    GERD (gastroesophageal reflux disease)    High cholesterol    History of hiatal hernia    Hypertension    Type II diabetes mellitus (HCC) dx'd ~ 2008    Past Surgical History:  Procedure Laterality Date   AMPUTATION Right 09/29/2017   Procedure: AMPUTATION RIGHT 1ST RAY;  Surgeon: Jerri Kay HERO, MD;  Location[facility];  Service: Orthopedics;  Laterality: Right;   AMPUTATION Right 01/29/2019   Procedure: RIGHT TRANSMETATARSAL AMPUTATION;  Surgeon: Harden Jerona GAILS, MD;  Location: [facility] OR;  Service: Orthopedics;  Laterality: Right;   AMPUTATION TOE Right 04/24/2018   Procedure: RIGHT 2ND TOE PROXIMAL INTERPHALANGEAL JOINT DISARTICULATION;  Surgeon: Jerri Kay HERO, MD;  Location: [facility];  Service: Orthopedics;  Laterality: Right;   APPLICATION OF WOUND VAC Right 09/29/2017   foot   BLADDER SUSPENSION  07/13/2020   Dr Alvia    COLONOSCOPY     I & D EXTREMITY Right 08/10/2017   Procedure: IRRIGATION AND DEBRIDEMENT FOOT;  Surgeon: Jerri Kay HERO, MD;  [facility]: [facility];  Service: Orthopedics;  Laterality: Right;   LUMBAR DISC SURGERY  1992   POLYPECTOMY     POSTERIOR LUMBAR FUSION  1996   TOE SURGERY Right 1980s   bone removed inbetween my toes   TOTAL HIP ARTHROPLASTY Left 11/16/2018   Procedure: LEFT TOTAL HIP ARTHROPLASTY ANTERIOR APPROACH;  Surgeon: Jerri Kay HERO, MD;  [facility];  Service: Orthopedics;  Laterality: Left;   TOTAL HIP ARTHROPLASTY Right 07/23/2021   Procedure: RIGHT TOTAL HIP REPLACEMENT;  Surgeon: Jerri Kay HERO, MD;  Location: [facility];  Service: Orthopedics;  Laterality: Right;   VAGINAL HYSTERECTOMY      Family History  Problem Relation Age of Onset    Cancer Mother        breast   Depression Mother        bipolar   Breast cancer Mother 36   Heart disease Father 54       MI   Diabetes Brother    Kidney disease Brother    Hypertension Brother    Hyperlipidemia Brother    Stroke Brother    Coronary artery disease Other    Diabetes Other    Colon cancer Neg Hx    Colon polyps Neg Hx    Esophageal cancer Neg Hx    Rectal cancer Neg Hx    Stomach cancer Neg Hx     Social History   Socioeconomic History   Marital status: Widowed    Spouse name: Not on file   Number of children: Not on file   Years of education: Not on file   Highest education level: Not on file  Occupational History   Not on file  Tobacco Use   Smoking status: Never   Smokeless tobacco: Never  Vaping Use   Vaping status: Never Used  Substance and Sexual Activity   Alcohol  use: Never   Drug use: Never   Sexual activity: Not Currently    Partners: Male  Other Topics Concern   Not on file  Social History Narrative   Not on file   Social Drivers of Health   Financial Resource Strain: Low Risk  (06/17/2022)   Overall Financial Resource Strain (CARDIA)    Difficulty of Paying Living Expenses: Not hard at all  Food Insecurity: No Food Insecurity (03/26/2022)   Hunger Vital Sign    Worried About Running Out of Food in the Last Year: Never true    Ran Out of Food in the Last Year: Never true  Transportation Needs: No Transportation Needs (06/17/2022)   PRAPARE - Administrator, Civil Service (Medical): No    Lack of Transportation (Non-Medical): No  Physical Activity: Insufficiently Active (11/30/2021)   Exercise Vital Sign    Days of Exercise per Week: 3 days    Minutes of Exercise per Session: 20 min  Stress: No Stress Concern Present (03/26/2022)   [employer] of Occupational Health - Occupational Stress Questionnaire    Feeling of Stress : Not at all  Social Connections: Moderately Isolated (06/17/2022)   Social Connection and  Isolation Panel    Frequency of Communication with Friends and Family: More than three times a week    Frequency of Social Gatherings with Friends and Family: Twice a week    Attends Religious Services: More than 4 times per year    Active Member of [employer] or Organizations: No    Attends Banker Meetings: Never    Marital Status: Widowed  Intimate Partner Violence: Not  At Risk (03/26/2022)   Humiliation, Afraid, Rape, and Kick questionnaire    Fear of Current or Ex-Partner: No    Emotionally Abused: No    Physically Abused: No    Sexually Abused: No    Outpatient Medications Prior to Visit  Medication Sig Dispense Refill   aspirin  EC 81 MG tablet Take 1 tablet (81 mg total) by mouth 2 (two) times daily. To be taken after surgery to prevent blood clots (Patient taking differently: Take 81 mg by mouth daily.) 84 tablet 0   Blood Glucose Monitoring Suppl (ONE TOUCH ULTRA 2) w/Device KIT Use to check blood glucose daily (Dx: E11.9 - type 2 DM) 1 kit 0   calcium  carbonate (TUMS - DOSED IN MG ELEMENTAL CALCIUM ) 500 MG chewable tablet Chew 2 tablets by mouth daily as needed for indigestion or heartburn.     Carboxymethylcellul-Glycerin (LUBRICATING EYE DROPS OP) Place 1 drop into both eyes daily as needed (dry eyes).     doxepin  (SINEQUAN ) 10 MG capsule Take 1 capsule (10 mg total) by mouth at bedtime.     gabapentin  (NEURONTIN ) 600 MG tablet Take 600 mg by mouth 4 (four) times daily.     glucose blood (ONE TOUCH ULTRA TEST) test strip Check Blood sugar once daily 100 each 5   morphine  (MS CONTIN ) 30 MG 12 hr tablet Take 30 mg by mouth every 12 (twelve) hours. Per Dr. Oneil Ellen with Pain Mgmt  0   morphine  (MS CONTIN ) 30 MG 12 hr tablet Take 1 tablet (30 mg total) by mouth every 12 (twelve) hours. 60 tablet 0   morphine  (MS CONTIN ) 30 MG 12 hr tablet Take 1 tablet (30 mg total) by mouth every 12 (twelve) hours. 60 tablet 0   morphine  (MS CONTIN ) 30 MG 12 hr tablet Take 1 tablet  (30 mg total) by mouth every 12 (twelve) hours. 60 tablet 0   morphine  (MS CONTIN ) 30 MG 12 hr tablet Take 1 tablet (30 mg total) by mouth every 12 (twelve) hours. 60 tablet 0   Multiple Vitamin (MULTIVITAMIN) capsule Take 1 capsule by mouth daily.     OneTouch Delica Lancets 33G MISC Check blood sugar once daily 100 each 5   Specialty Vitamins Products (BIOTIN PLUS KERATIN PO) Take 1 capsule by mouth daily.     terbinafine  (LAMISIL ) 250 MG tablet Take 1 tablet (250 mg total) by mouth daily. 42 tablet 0   tiZANidine  (ZANAFLEX ) 4 MG tablet Take 1 tablet (4 mg total) by mouth 3 (three) times daily as needed for muscle spasms. 270 tablet 0   VITAMIN D PO Take 1,000 Units by mouth daily.     amLODipine  (NORVASC ) 5 MG tablet Take 1 tablet (5 mg total) by mouth daily. 30 tablet 0   atorvastatin  (LIPITOR) 10 MG tablet Take 1 tablet (10 mg total) by mouth daily. 30 tablet 0   bisoprolol -hydrochlorothiazide  (ZIAC ) 10-6.25 MG tablet Take 1 tablet by mouth daily. 30 tablet 0   dapagliflozin  propanediol (FARXIGA ) 10 MG TABS tablet Take 1 tablet (10 mg total) by mouth daily before breakfast. 90 tablet 1   escitalopram  (LEXAPRO ) 10 MG tablet Take 1 tablet (10 mg total) by mouth daily. 30 tablet 0   estradiol  (ESTRACE ) 1 MG tablet Take 1 tablet (1 mg total) by mouth daily. 30 tablet 0   furosemide  (LASIX ) 20 MG tablet TAKE 1 TABLET DAILY 90 tablet 1   KLOR-CON  M20 20 MEQ tablet Take 1 tablet (20 mEq total) by mouth  daily. 30 tablet 0   omeprazole  (PRILOSEC) 40 MG capsule TAKE 1 CAPSULE DAILY 90 capsule 1   sitaGLIPtin -metformin  (JANUMET ) 50-1000 MG tablet Take 2 tablets by mouth at bedtime. 60 tablet 0   No facility-administered medications prior to visit.    Allergies  Allergen Reactions   Sulfonamide Derivatives Hives    Review of Systems  Constitutional:  Negative for chills, fever and malaise/fatigue.  HENT:  Negative for congestion and hearing loss.   Eyes:  Negative for blurred vision and  discharge.  Respiratory:  Negative for cough, sputum production and shortness of breath.   Cardiovascular:  Negative for chest pain, palpitations and leg swelling.  Gastrointestinal:  Negative for abdominal pain, blood in stool, constipation, diarrhea, heartburn, nausea and vomiting.  Genitourinary:  Negative for dysuria, frequency, hematuria and urgency.  Musculoskeletal:  Negative for back pain, falls and myalgias.  Skin:  Negative for rash.  Neurological:  Negative for dizziness, sensory change, loss of consciousness, weakness and headaches.  Endo/Heme/Allergies:  Negative for environmental allergies. Does not bruise/bleed easily.  Psychiatric/Behavioral:  Negative for depression and suicidal ideas. The patient is not nervous/anxious and does not have insomnia.        Objective:    Physical Exam Vitals and nursing note reviewed.  Constitutional:      General: She is not in acute distress.    Appearance: Normal appearance. She is well-developed.  Pulmonary:     Effort: Pulmonary effort is normal.   Neurological:     Mental Status: She is alert and oriented to person, place, and time.   Psychiatric:        Mood and Affect: Mood normal.        Behavior: Behavior normal.        Thought Content: Thought content normal.        Judgment: Judgment normal.     There were no vitals taken for this visit. Wt Readings from Last 3 Encounters:  05/30/22 173 lb (78.5 kg)  05/21/22 189 lb 3.2 oz (85.8 kg)  05/15/22 173 lb (78.5 kg)       Assessment & Plan:  Hypertension, unspecified type Assessment & Plan: Well controlled, no changes to meds. Encouraged heart healthy diet such as the DASH diet and exercise as tolerated.    Orders: -     amLODIPine  Besylate; Take 1 tablet (5 mg total) by mouth daily.  Dispense: 90 tablet; Refill: 1 -     Bisoprolol -hydroCHLOROthiazide ; Take 1 tablet by mouth daily.  Dispense: 90 tablet; Refill: 1 -     Klor-Con  M20; Take 1 tablet (20 mEq total) by  mouth daily.  Dispense: 30 tablet; Refill: 0 -     Lipid panel; Future -     CBC with Differential/Platelet; Future -     Comprehensive metabolic panel with GFR; Future -     Hemoglobin A1c; Future -     Microalbumin / creatinine urine ratio; Future  Hyperlipidemia associated with type 2 diabetes mellitus (HCC) Assessment & Plan: Tolerating statin, encouraged heart healthy diet, avoid trans fats, minimize simple carbs and saturated fats. Increase exercise as tolerated   Orders: -     Atorvastatin  Calcium ; Take 1 tablet (10 mg total) by mouth daily.  Dispense: 90 tablet; Refill: 1 -     Lipid panel; Future -     CBC with Differential/Platelet; Future  Generalized anxiety disorder -     Escitalopram  Oxalate; Take 1 tablet (10 mg total) by mouth daily.  Dispense:  90 tablet; Refill: 3 -     Lipid panel; Future -     CBC with Differential/Platelet; Future  Menopause -     Estradiol ; Take 1 tablet (1 mg total) by mouth daily.  Dispense: 30 tablet; Refill: 0  Edema, unspecified type -     Furosemide ; Take 1 tablet (20 mg total) by mouth daily.  Dispense: 90 tablet; Refill: 1  Gastroesophageal reflux disease -     Omeprazole ; Take 1 capsule (40 mg total) by mouth daily.  Dispense: 90 capsule; Refill: 1  Uncontrolled type 2 diabetes mellitus with ketoacidosis without coma, without long-term current use of insulin  (HCC) -     Janumet ; Take 2 tablets by mouth at bedtime.  Dispense: 60 tablet; Refill: 0 -     Lipid panel; Future -     CBC with Differential/Platelet; Future -     Comprehensive metabolic panel with GFR; Future -     Hemoglobin A1c; Future -     Microalbumin / creatinine urine ratio; Future  Other orders -     Dapagliflozin  Propanediol; Take 1 tablet (10 mg total) by mouth daily before breakfast.  Dispense: 90 tablet; Refill: 1   Assessment and Plan Assessment & Plan Ankle Fracture   She recently fractured her ankle after a fall in the kitchen, worsened by previous toe  amputations on her right foot. Post-surgical management involves wearing a boot. Follow up with Dr. Harden on July 3rd for assessment and potential boot removal.  Diabetes Mellitus   She has diabetes mellitus and reports not regularly monitoring her blood glucose. Encourage regular blood glucose monitoring and order blood work for a future lab appointment.  Medication Management   She needs refills for gabapentin  and doxepin . Transitioning to [facility], she requires enough medication until her new insurance is active. Prescriptions will be sent to [facility] pharmacy for immediate pickup to avoid mail delivery delays.    I discussed the assessment and treatment plan with the patient. The patient was provided an opportunity to ask questions and all were answered. The patient agreed with the plan and demonstrated an understanding of the instructions.   The patient was advised to call back or seek an in-person evaluation if the symptoms worsen or if the condition fails to improve as anticipated.  Jamee JONELLE Antonio Cyndee, DO  [facility] [phone] (phone) [phone] (fax)  [facility]

## 2023-10-07 NOTE — Telephone Encounter (Signed)
 Pt had virtual yesterday

## 2023-10-09 ENCOUNTER — Ambulatory Visit (INDEPENDENT_AMBULATORY_CARE_PROVIDER_SITE_OTHER): Admitting: Orthopedic Surgery

## 2023-10-09 ENCOUNTER — Other Ambulatory Visit (INDEPENDENT_AMBULATORY_CARE_PROVIDER_SITE_OTHER): Payer: Self-pay

## 2023-10-09 DIAGNOSIS — I872 Venous insufficiency (chronic) (peripheral): Secondary | ICD-10-CM

## 2023-10-09 DIAGNOSIS — L97521 Non-pressure chronic ulcer of other part of left foot limited to breakdown of skin: Secondary | ICD-10-CM

## 2023-10-09 DIAGNOSIS — S8265XA Nondisplaced fracture of lateral malleolus of left fibula, initial encounter for closed fracture: Secondary | ICD-10-CM

## 2023-10-09 DIAGNOSIS — M25572 Pain in left ankle and joints of left foot: Secondary | ICD-10-CM | POA: Diagnosis not present

## 2023-10-14 ENCOUNTER — Encounter: Payer: Self-pay | Admitting: Orthopedic Surgery

## 2023-10-14 NOTE — Progress Notes (Signed)
 Office Visit Note   Patient: Brittany [name]           Date of Birth: 09-Sep-1945           MRN: [id number] Visit Date: 10/09/2023              Requested by: Antonio Meth, [place], [place] [address] [place],  [place] [address] PCP: Antonio Meth, Jamee SAUNDERS, DO  Chief Complaint  Patient presents with   Left Ankle - Follow-up      HPI: Patient is a 78 year old woman presents in follow-up for ulceration left midfoot with a Weber B fibular fracture.  Assessment & Plan: Visit Diagnoses:  1. Pain in left ankle and joints of left foot   2. Nondisplaced fracture of lateral malleolus of left fibula, initial encounter for closed fracture   3. Venous stasis dermatitis of both lower extremities     Plan: Follow-up in 4 weeks with three-view radiographs of the left ankle.  Continue with daily dressing changes in the fracture boot.  Follow-Up Instructions: No follow-ups on file.   Ortho Exam  Patient is alert, oriented, no adenopathy, well-dressed, normal affect, normal respiratory effort. Examination the fibula is not tender to palpation.  There is a 1 cm diameter ulcer plantar aspect collapse left midfoot.  There is no cellulitis no tunneling no exposed bone or tendon.    Imaging: No results found. No images are attached to the encounter.  Labs: Lab Results  Component Value Date   HGBA1C 6.8 (H) 09/03/2022   HGBA1C 7.0 (H) 05/21/2022   HGBA1C 6.6 (H) 03/12/2022   REPTSTATUS 08/15/2017 FINAL 08/10/2017   GRAMSTAIN NO WBC SEEN NO ORGANISMS SEEN  08/10/2017   CULT  08/10/2017    No growth aerobically or anaerobically. Performed at [facility], [address] [place], [place] [address]    IDOLINA  03/11/2016    Three or more organisms present,each greater than 10,000 CFU/mL.These organisms,commonly found on external and internal genitalia,are considered to be colonizers.No further testing performed.      Lab Results  Component Value Date   ALBUMIN  3.9 09/03/2022   ALBUMIN 3.9 05/21/2022   ALBUMIN 4.3 03/12/2022    No results found for: MG No results found for: VD25OH  No results found for: PREALBUMIN    Latest Ref Rng & Units 05/21/2022    1:49 PM 12/06/2021    8:23 AM 07/24/2021    2:34 PM  CBC EXTENDED  WBC 4.0 - 10.5 K/uL 7.5  8.1  17.8   RBC 3.87 - 5.11 Mil/uL 4.96  4.96  4.38   Hemoglobin 12.0 - 15.0 g/dL 86.9  88.5  88.3   HCT 36.0 - 46.0 % 40.1  36.7  36.9   Platelets 150.0 - 400.0 K/uL 228.0  255.0  220   NEUT# 1.4 - 7.7 K/uL 4.5  5.8    Lymph# 0.7 - 4.0 K/uL 2.1  1.5       There is no height or weight on file to calculate BMI.  Orders:  Orders Placed This Encounter  Procedures   XR Ankle Complete Left   No orders of the defined types were placed in this encounter.    Procedures: No procedures performed  Clinical Data: No additional findings.  ROS:  All other systems negative, except as noted in the HPI. Review of Systems  Objective: Vital Signs: There were no vitals taken for this visit.  Specialty Comments:  No  specialty comments available.  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/16/2018   Vaginal prolapse 09/30/2018   Primary osteoarthritis of left hip 06/10/2018   Osteomyelitis of second toe of right foot (HCC) 04/24/2018   Osteomyelitis of right foot (HCC) 04/21/2018   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) 04/17/2018   Callus 01/13/2018   Generalized anxiety disorder 10/16/2017   Menopause 10/16/2017   Diabetes mellitus, type II (HCC) 10/16/2017   Hyperlipidemia associated with type 2  diabetes mellitus (HCC) 10/16/2017   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) 10/14/2017   Osteomyelitis of great toe of right foot (HCC) 09/25/2017   Pain in right foot 09/16/2017   Cellulitis 08/08/2017   Hypoglycemia without diagnosis of diabetes mellitus 08/08/2017   Peripheral neuropathy 08/08/2017   Abscess 08/08/2017   Hyperlipidemia LDL goal <70 09/22/2016   Tooth pain 09/03/2012   Edema 09/03/2012   Supraclavicular fossa fullness 06/18/2012   Hair loss 09/18/2010   Fatigue 09/18/2010   DYSPEPSIA&OTHER SPEC DISORDERS FUNCTION STOMACH 05/08/2010   DIARRHEA 05/08/2010   DIZZINESS 04/19/2010   OTHER ACUTE SINUSITIS 02/28/2010   NAUSEA 02/28/2010   Pain in right ankle and joints of right foot 02/15/2010   URI 07/09/2007   GERD 02/12/2007   LOW BACK PAIN 02/12/2007   Hypertension 09/03/2006   HOT FLASHES 09/03/2006   INSOMNIA 09/03/2006   Past Medical History:  Diagnosis Date   Anemia    as a younger woman   Anxiety    Arthritis    all over my body (10/01/2017)   Chronic pain    In pain clinic    Constipation    Depression    Diabetic peripheral neuropathy (HCC)    GERD (gastroesophageal reflux disease)    High cholesterol    History of hiatal hernia    Hypertension    Type II diabetes mellitus (HCC) dx'd ~ 2008    Family History  Problem Relation Age of Onset   Cancer Mother        breast   Depression Mother        bipolar   Breast cancer Mother 45   Heart disease Father 92       MI   Diabetes Brother    Kidney disease Brother    Hypertension Brother    Hyperlipidemia Brother    Stroke Brother    Coronary artery disease Other    Diabetes Other    Colon cancer Neg Hx    Colon polyps Neg Hx    Esophageal cancer Neg Hx    Rectal cancer Neg Hx    Stomach cancer Neg Hx     Past Surgical History:  Procedure Laterality Date   AMPUTATION Right 09/29/2017   Procedure: AMPUTATION RIGHT 1ST RAY;  Surgeon: Jerri Kay HERO, MD;   Location: [facility];  Service: Orthopedics;  Laterality: Right;   AMPUTATION Right 01/29/2019   Procedure: RIGHT TRANSMETATARSAL AMPUTATION;  Surgeon: Harden Jerona GAILS, MD;  Location: [facility] OR;  Service: Orthopedics;  Laterality: Right;   AMPUTATION TOE Right 04/24/2018   Procedure:  RIGHT 2ND TOE PROXIMAL INTERPHALANGEAL JOINT DISARTICULATION;  Surgeon: Jerri Kay HERO, MD;  Location: [facility];  Service: Orthopedics;  Laterality: Right;   APPLICATION OF WOUND VAC Right 09/29/2017   foot   BLADDER SUSPENSION  07/13/2020   Dr Alvia    COLONOSCOPY     I & D EXTREMITY Right 08/10/2017   Procedure: IRRIGATION AND DEBRIDEMENT FOOT;  Surgeon: Jerri Kay HERO, MD;  Location: [facility];  Service: Orthopedics;  Laterality: Right;   LUMBAR DISC SURGERY  1992   POLYPECTOMY     POSTERIOR LUMBAR FUSION  1996   TOE SURGERY Right 1980s   bone removed inbetween my toes   TOTAL HIP ARTHROPLASTY Left 11/16/2018   Procedure: LEFT TOTAL HIP ARTHROPLASTY ANTERIOR APPROACH;  Surgeon: Jerri Kay HERO, MD;  Location: [facility];  Service: Orthopedics;  Laterality: Left;   TOTAL HIP ARTHROPLASTY Right 07/23/2021   Procedure: RIGHT TOTAL HIP REPLACEMENT;  Surgeon: Jerri Kay HERO, MD;  Location: [facility];  Service: Orthopedics;  Laterality: Right;   VAGINAL HYSTERECTOMY     Social History   Occupational History   Not on file  Tobacco Use   Smoking status: Never   Smokeless tobacco: Never  Vaping Use   Vaping status: Never Used  Substance and Sexual Activity   Alcohol  use: Never   Drug use: Never   Sexual activity: Not Currently    Partners: Male

## 2023-10-23 ENCOUNTER — Other Ambulatory Visit (HOSPITAL_COMMUNITY): Payer: Self-pay

## 2023-11-06 ENCOUNTER — Ambulatory Visit: Admitting: Orthopedic Surgery

## 2023-11-11 ENCOUNTER — Ambulatory Visit (INDEPENDENT_AMBULATORY_CARE_PROVIDER_SITE_OTHER): Payer: Medicare (Managed Care)

## 2023-11-11 VITALS — Ht 71.0 in | Wt 173.0 lb

## 2023-11-11 DIAGNOSIS — Z0000 Encounter for general adult medical examination without abnormal findings: Secondary | ICD-10-CM

## 2023-11-11 NOTE — Patient Instructions (Signed)
 Ms. Cutsforth , Thank you for taking time out of your busy schedule to complete your Annual Wellness Visit with me. I enjoyed our conversation and look forward to speaking with you again next year. I, as well as your care team,  appreciate your ongoing commitment to your health goals. Please review the following plan we discussed and let me know if I can assist you in the future. Your Game plan/ To Do List     Follow up Visits: We will see or speak with you next year for your Next Medicare AWV with our clinical staff Have you seen your provider in the last 6 months (3 months if uncontrolled diabetes)? Yes  Clinician Recommendations:  Aim for 30 minutes of exercise or brisk walking, 6-8 glasses of water, and 5 servings of fruits and vegetables each day.       This is a list of the screenings recommended for you:  Health Maintenance  Topic Date Due   Mammogram  05/01/2018   Yearly kidney health urinalysis for diabetes  09/01/2021   COVID-19 Vaccine (4 - 2024-25 season) 12/08/2022   Hemoglobin A1C  03/06/2023   Complete foot exam   05/22/2023   Eye exam for diabetics  06/06/2023   Yearly kidney function blood test for diabetes  09/03/2023   Flu Shot  11/07/2023   Medicare Annual Wellness Visit  11/10/2024   DTaP/Tdap/Td vaccine (3 - Td or Tdap) 03/26/2028   Colon Cancer Screening  05/30/2032   Pneumococcal Vaccine for age over 54  Completed   DEXA scan (bone density measurement)  Completed   Hepatitis C Screening  Completed   Zoster (Shingles) Vaccine  Completed   Hepatitis B Vaccine  Aged Out   HPV Vaccine  Aged Out   Meningitis B Vaccine  Aged Out    Advanced directives: (ACP Link)Information on Advanced Care Planning can be found at Product manager Advance Health Care Directives Advance Health Care Directives. [email or web address]   Advance Care Planning is important because it:  [x]  Makes sure you receive the medical care that is consistent with your values, goals, and  preferences  [x]  It provides guidance to your family and loved ones and reduces their decisional burden about whether or not they are making the right decisions based on your wishes.  Follow the link provided in your after visit summary or read over the paperwork we have mailed to you to help you started getting your Advance Directives in place. If you need assistance in completing these, please reach out to us  so that we can help you!  See attachments for Preventive Care and Fall Prevention Tips.

## 2023-11-11 NOTE — Progress Notes (Signed)
 Subjective:   ALLESSANDRA BERNARDI is a 78 y.o. who presents for a Medicare Wellness preventive visit.  As a reminder, Annual Wellness Visits don't include a physical exam, and some assessments may be limited, especially if this visit is performed virtually. We may recommend an in-person follow-up visit with your provider if needed.  Visit Complete: Virtual I connected with  Earnie FORBES Dayhoff on 11/11/23 by a audio enabled telemedicine application and verified that I am speaking with the correct person using two identifiers.  Patient Location: Home  Provider Location: Home Office  I discussed the limitations of evaluation and management by telemedicine. The patient expressed understanding and agreed to proceed.  Vital Signs: Because this visit was a virtual/telehealth visit, some criteria may be missing or patient reported. Any vitals not documented were not able to be obtained and vitals that have been documented are patient reported.  VideoDeclined- This patient declined Librarian, academic. Therefore the visit was completed with audio only.  Persons Participating in Visit: Patient.  AWV Questionnaire: No: Patient Medicare AWV questionnaire was not completed prior to this visit.  Cardiac Risk Factors include: advanced age (>64men, >56 women);diabetes mellitus;dyslipidemia;hypertension;sedentary lifestyle     Objective:    Today's Vitals   11/11/23 1351  Weight: 173 lb (78.5 kg)  Height: 5' 11 (1.803 m)   Body mass index is 24.13 kg/m.     11/11/2023    2:00 PM 03/26/2022    2:23 PM 07/25/2021    1:00 AM 07/23/2021   11:31 AM 07/12/2021   10:05 AM 01/25/2021    2:31 PM 12/02/2019    7:42 AM  Advanced Directives  Does Patient Have a Medical Advance Directive? No No No Yes Yes Yes No  Type of Advance Directive    Living will Living will Healthcare Power of [place];Living will   Does patient want to make changes to medical advance directive?      Yes  (MAU/Ambulatory/Procedural Areas - Information given)   Would patient like information on creating a medical advance directive? Yes (MAU/Ambulatory/Procedural Areas - Information given) No - Patient declined No - Patient declined    No - Patient declined    Current Medications (verified) Outpatient Encounter Medications as of 11/11/2023  Medication Sig   amLODipine  (NORVASC ) 5 MG tablet Take 1 tablet (5 mg total) by mouth daily.   aspirin  EC 81 MG tablet Take 1 tablet (81 mg total) by mouth 2 (two) times daily. To be taken after surgery to prevent blood clots (Patient taking differently: Take 81 mg by mouth daily.)   atorvastatin  (LIPITOR) 10 MG tablet Take 1 tablet (10 mg total) by mouth daily.   bisoprolol -hydrochlorothiazide  (ZIAC ) 10-6.25 MG tablet Take 1 tablet by mouth daily.   Blood Glucose Monitoring Suppl (ONE TOUCH ULTRA 2) w/Device KIT Use to check blood glucose daily (Dx: E11.9 - type 2 DM)   calcium  carbonate (TUMS - DOSED IN MG ELEMENTAL CALCIUM ) 500 MG chewable tablet Chew 2 tablets by mouth daily as needed for indigestion or heartburn.   Carboxymethylcellul-Glycerin (LUBRICATING EYE DROPS OP) Place 1 drop into both eyes daily as needed (dry eyes).   dapagliflozin  propanediol (FARXIGA ) 10 MG TABS tablet Take 1 tablet (10 mg total) by mouth daily before breakfast.   doxepin  (SINEQUAN ) 10 MG capsule Take 1 capsule (10 mg total) by mouth at bedtime.   escitalopram  (LEXAPRO ) 10 MG tablet Take 1 tablet (10 mg total) by mouth daily.   estradiol  (ESTRACE ) 1 MG tablet  Take 1 tablet (1 mg total) by mouth daily.   furosemide  (LASIX ) 20 MG tablet Take 1 tablet (20 mg total) by mouth daily.   gabapentin  (NEURONTIN ) 600 MG tablet Take 600 mg by mouth 4 (four) times daily.   glucose blood (ONE TOUCH ULTRA TEST) test strip Check Blood sugar once daily   KLOR-CON  M20 20 MEQ tablet Take 1 tablet (20 mEq total) by mouth daily.   morphine  (MS CONTIN ) 30 MG 12 hr tablet Take 30 mg by mouth every 12  (twelve) hours. Per Dr. Oneil Ellen with Pain Mgmt   morphine  (MS CONTIN ) 30 MG 12 hr tablet Take 1 tablet (30 mg total) by mouth every 12 (twelve) hours.   morphine  (MS CONTIN ) 30 MG 12 hr tablet Take 1 tablet (30 mg total) by mouth every 12 (twelve) hours.   morphine  (MS CONTIN ) 30 MG 12 hr tablet Take 1 tablet (30 mg total) by mouth every 12 (twelve) hours.   morphine  (MS CONTIN ) 30 MG 12 hr tablet Take 1 tablet (30 mg total) by mouth every 12 (twelve) hours.   Multiple Vitamin (MULTIVITAMIN) capsule Take 1 capsule by mouth daily.   omeprazole  (PRILOSEC) 40 MG capsule Take 1 capsule (40 mg total) by mouth daily.   OneTouch Delica Lancets 33G MISC Check blood sugar once daily   sitaGLIPtin -metformin  (JANUMET ) 50-1000 MG tablet Take 2 tablets by mouth at bedtime.   Specialty Vitamins Products (BIOTIN PLUS KERATIN PO) Take 1 capsule by mouth daily.   terbinafine  (LAMISIL ) 250 MG tablet Take 1 tablet (250 mg total) by mouth daily.   tiZANidine  (ZANAFLEX ) 4 MG tablet Take 1 tablet (4 mg total) by mouth 3 (three) times daily as needed for muscle spasms.   VITAMIN D PO Take 1,000 Units by mouth daily.   No facility-administered encounter medications on file as of 11/11/2023.    Allergies (verified) Sulfonamide derivatives   History: Past Medical History:  Diagnosis Date   Anemia    as a younger woman   Anxiety    Arthritis    all over my body (10/01/2017)   Chronic pain    In pain clinic    Constipation    Depression    Diabetic peripheral neuropathy (HCC)    GERD (gastroesophageal reflux disease)    High cholesterol    History of hiatal hernia    Hypertension    Type II diabetes mellitus (HCC) dx'd ~ 2008   Past Surgical History:  Procedure Laterality Date   AMPUTATION Right 09/29/2017   Procedure: AMPUTATION RIGHT 1ST RAY;  Surgeon: Jerri Kay HERO, MD;  Location: [facility] OR;  Service: Orthopedics;  Laterality: Right;   AMPUTATION Right 01/29/2019   Procedure: RIGHT  TRANSMETATARSAL AMPUTATION;  Surgeon: Harden Jerona GAILS, MD;  Location: [facility] OR;  Service: Orthopedics;  Laterality: Right;   AMPUTATION TOE Right 04/24/2018   Procedure: RIGHT 2ND TOE PROXIMAL INTERPHALANGEAL JOINT DISARTICULATION;  Surgeon: Jerri Kay HERO, MD;  Location: [facility];  Service: Orthopedics;  Laterality: Right;   APPLICATION OF WOUND VAC Right 09/29/2017   foot   BLADDER SUSPENSION  07/13/2020   Dr Alvia    COLONOSCOPY     I & D EXTREMITY Right 08/10/2017   Procedure: IRRIGATION AND DEBRIDEMENT FOOT;  Surgeon: Jerri Kay HERO, MD;  Location: [facility] OR;  Service: Orthopedics;  Laterality: Right;   LUMBAR DISC SURGERY  1992   POLYPECTOMY     POSTERIOR LUMBAR FUSION  1996   TOE SURGERY Right 1980s  bone removed inbetween my toes   TOTAL HIP ARTHROPLASTY Left 11/16/2018   Procedure: LEFT TOTAL HIP ARTHROPLASTY ANTERIOR APPROACH;  Surgeon: Jerri Kay HERO, MD;  Location: [facility] OR;  Service: Orthopedics;  Laterality: Left;   TOTAL HIP ARTHROPLASTY Right 07/23/2021   Procedure: RIGHT TOTAL HIP REPLACEMENT;  Surgeon: Jerri Kay HERO, MD;  Location: [facility] OR;  Service: Orthopedics;  Laterality: Right;   VAGINAL HYSTERECTOMY     Family History  Problem Relation Age of Onset   Cancer Mother        breast   Depression Mother        bipolar   Breast cancer Mother 40   Heart disease Father 56       MI   Diabetes Brother    Kidney disease Brother    Hypertension Brother    Hyperlipidemia Brother    Stroke Brother    Coronary artery disease Other    Diabetes Other    Colon cancer Neg Hx    Colon polyps Neg Hx    Esophageal cancer Neg Hx    Rectal cancer Neg Hx    Stomach cancer Neg Hx    Social History   Socioeconomic History   Marital status: Widowed    Spouse name: Not on file   Number of children: Not on file   Years of education: Not on file   Highest education level: Not on file  Occupational History   Not on file  Tobacco Use   Smoking status: Never    Smokeless tobacco: Never  Vaping Use   Vaping status: Never Used  Substance and Sexual Activity   Alcohol  use: Never   Drug use: Never   Sexual activity: Not Currently    Partners: Male  Other Topics Concern   Not on file  Social History Narrative   Not on file   Social Drivers of Health   Financial Resource Strain: Low Risk  (11/11/2023)   Overall Financial Resource Strain (CARDIA)    Difficulty of Paying Living Expenses: Not hard at all  Food Insecurity: No Food Insecurity (11/11/2023)   Hunger Vital Sign    Worried About Running Out of Food in the Last Year: Never true    Ran Out of Food in the Last Year: Never true  Transportation Needs: No Transportation Needs (11/11/2023)   PRAPARE - Administrator, Civil Service (Medical): No    Lack of Transportation (Non-Medical): No  Physical Activity: Inactive (11/11/2023)   Exercise Vital Sign    Days of Exercise per Week: 0 days    Minutes of Exercise per Session: 0 min  Stress: No Stress Concern Present (11/11/2023)   [employer] of Occupational Health - Occupational Stress Questionnaire    Feeling of Stress: Only a little  Social Connections: Moderately Isolated (11/11/2023)   Social Connection and Isolation Panel    Frequency of Communication with Friends and Family: More than three times a week    Frequency of Social Gatherings with Friends and Family: Twice a week    Attends Religious Services: More than 4 times per year    Active Member of [employer] or Organizations: No    Attends Banker Meetings: Never    Marital Status: Widowed    Tobacco Counseling Counseling given: Not Answered    Clinical Intake:  Pre-visit preparation completed: Yes  Pain : No/denies pain  Diabetes: Yes CBG done?: No Did pt. bring in CBG monitor from home?: No (doesn't check regularly)  Lab Results  Component Value Date   HGBA1C 6.8 (H) 09/03/2022   HGBA1C 7.0 (H) 05/21/2022   HGBA1C 6.6 (H) 03/12/2022     How  often do you need to have someone help you when you read instructions, pamphlets, or other written materials from your doctor or pharmacy?: 1 - Never  Interpreter Needed?: No  Information entered by :: Charmaine Bloodgood LPN   Activities of Daily Living     11/11/2023    2:00 PM  In your present state of health, do you have any difficulty performing the following activities:  Hearing? 0  Vision? 0  Difficulty concentrating or making decisions? 0  Walking or climbing stairs? 1  Dressing or bathing? 0  Doing errands, shopping? 0  Preparing Food and eating ? N  Using the Toilet? N  In the past six months, have you accidently leaked urine? N  Do you have problems with loss of bowel control? N  Managing your Medications? N  Managing your Finances? N  Housekeeping or managing your Housekeeping? N    Patient Care Team: Antonio Meth, Jamee SAUNDERS, DO as PCP - General Orlando Anes, MD as Consulting Physician (Neurology) Teressa Toribio SQUIBB, MD (Inactive) as Attending Physician (Gastroenterology) Jerri Kay HERO, MD as Attending Physician (Orthopedic Surgery) Carla Milling, RPH-CPP (Pharmacist) Harden Jerona GAILS, MD as Consulting Physician (Orthopedic Surgery) Robinson Idol, MD as Consulting Physician (Ophthalmology)  I have updated your Care Teams any recent Medical Services you may have received from other providers in the past year.     Assessment:   This is a routine wellness examination for Laconya.  Hearing/Vision screen Hearing Screening - Comments:: Denies hearing difficulties   Vision Screening - Comments:: No vision problems; will schedule routine eye exam soon with Dr. Robinson number provided to schedule    Goals Addressed             This Visit's Progress    Prevent falls   On track      Depression Screen     11/11/2023    1:58 PM 03/26/2022    2:30 PM 11/30/2021   11:36 AM 11/30/2021   11:35 AM 11/30/2021   11:34 AM 01/29/2021   10:55 AM 01/25/2021    2:34 PM  PHQ 2/9  Scores  PHQ - 2 Score 0 0 0 0 0 4 3  PHQ- 9 Score      10     Fall Risk     11/11/2023    1:59 PM 05/21/2022    1:13 PM 03/26/2022    2:23 PM 11/19/2021   10:14 AM 01/25/2021    2:33 PM  Fall Risk   Falls in the past year? 1 1 1 1 1   Number falls in past yr: 1 0 1 1 1   Comment     impaired balance  Injury with Fall? 1 0 0 1 0  Risk for fall due to : History of fall(s);Impaired balance/gait;Impaired mobility History of fall(s);Impaired balance/gait History of fall(s);Impaired balance/gait History of fall(s);Impaired balance/gait Impaired balance/gait  Follow up Education provided;Falls prevention discussed;Falls evaluation completed Falls evaluation completed Falls evaluation completed  Falls evaluation completed  Falls prevention discussed      Data saved with a previous flowsheet row definition    MEDICARE RISK AT HOME:  Medicare Risk at Home Any stairs in or around the home?: No If so, are there any without handrails?: No Home free of loose throw rugs in walkways, pet beds, electrical cords, etc?:  Yes Adequate lighting in your home to reduce risk of falls?: Yes Life alert?: No Use of a cane, walker or w/c?: No Grab bars in the bathroom?: Yes Shower chair or bench in shower?: No Elevated toilet seat or a handicapped toilet?: No  TIMED UP AND GO:  Was the test performed?  No  Cognitive Function: Declined/Normal: No cognitive concerns noted by patient or family. Patient alert, oriented, able to answer questions appropriately and recall recent events. No signs of memory loss or confusion.    02/19/2016    2:39 PM 12/07/2014   11:44 AM  MMSE - Mini Mental State Exam  Orientation to time 5  5   Orientation to Place 5  5   Registration 3  3   Attention/ Calculation 5  5   Recall 3  3   Language- name 2 objects 2  2   Language- repeat 1 1  Language- follow 3 step command 3  3   Language- read & follow direction 1  1   Write a sentence 1  1   Copy design 1  1   Total score  30  30      Data saved with a previous flowsheet row definition        03/26/2022    2:43 PM  6CIT Screen  What Year? 0 points  What month? 0 points  What time? 0 points  Count back from 20 0 points  Months in reverse 0 points  Repeat phrase 2 points  Total Score 2 points    Immunizations Immunization History  Administered Date(s) Administered   DTP 07/14/2002   Fluad Quad(high Dose 65+) 12/27/2019   Influenza Whole 02/12/2007   Influenza, High Dose Seasonal PF 02/11/2014, 02/10/2015, 02/10/2015, 03/26/2018, 03/11/2019, 12/27/2019   Influenza,inj,Quad PF,6+ Mos 05/14/2013, 02/19/2016   PFIZER Comirnaty(Gray Top)Covid-19 Tri-Sucrose Vaccine 05/31/2019, 06/21/2019, 02/01/2020   Pneumococcal Conjugate-13 05/04/2015   Pneumococcal Polysaccharide-23 05/14/2013, 03/26/2018   Tdap 03/26/2018   Zoster Recombinant(Shingrix) 10/04/2016, 01/08/2017   Zoster, Live 01/02/2010    Screening Tests Health Maintenance  Topic Date Due   MAMMOGRAM  05/01/2018   Diabetic kidney evaluation - Urine ACR  09/01/2021   COVID-19 Vaccine (4 - 2024-25 season) 12/08/2022   HEMOGLOBIN A1C  03/06/2023   FOOT EXAM  05/22/2023   OPHTHALMOLOGY EXAM  06/06/2023   Diabetic kidney evaluation - eGFR measurement  09/03/2023   INFLUENZA VACCINE  11/07/2023   Medicare Annual Wellness (AWV)  11/10/2024   DTaP/Tdap/Td (3 - Td or Tdap) 03/26/2028   Colonoscopy  05/30/2032   Pneumococcal Vaccine: 50+ Years  Completed   DEXA SCAN  Completed   Hepatitis C Screening  Completed   Zoster Vaccines- Shingrix  Completed   Hepatitis B Vaccines  Aged Out   HPV VACCINES  Aged Out   Meningococcal B Vaccine  Aged Out    Health Maintenance  Health Maintenance Due  Topic Date Due   MAMMOGRAM  05/01/2018   Diabetic kidney evaluation - Urine ACR  09/01/2021   COVID-19 Vaccine (4 - 2024-25 season) 12/08/2022   HEMOGLOBIN A1C  03/06/2023   FOOT EXAM  05/22/2023   OPHTHALMOLOGY EXAM  06/06/2023   Diabetic kidney  evaluation - eGFR measurement  09/03/2023   INFLUENZA VACCINE  11/07/2023   Health Maintenance Items Addressed: Patient will schedule diabetic eye exam soon; postpone dexa due to recent fracture   Additional Screening:  Vision Screening: Recommended annual ophthalmology exams for early detection of glaucoma and other disorders  of the eye. Would you like a referral to an eye doctor? No    Dental Screening: Recommended annual dental exams for proper oral hygiene  Community Resource Referral / Chronic Care Management: CRR required this visit?  No   CCM required this visit?  No   Plan:    I have personally reviewed and noted the following in the patient's chart:   Medical and social history Use of alcohol , tobacco or illicit drugs  Current medications and supplements including opioid prescriptions. Patient is currently taking opioid prescriptions. Information provided to patient regarding non-opioid alternatives. Patient advised to discuss non-opioid treatment plan with their provider. Functional ability and status Nutritional status Physical activity Advanced directives List of other physicians Hospitalizations, surgeries, and ER visits in previous 12 months Vitals Screenings to include cognitive, depression, and falls Referrals and appointments  In addition, I have reviewed and discussed with patient certain preventive protocols, quality metrics, and best practice recommendations. A written personalized care plan for preventive services as well as general preventive health recommendations were provided to patient.   Lavelle [place], [place]   04/11/7972   After Visit Summary: (Declined) Due to this being a telephonic visit, with patients personalized plan was offered to patient but patient Declined AVS at this time   Notes: PCP Follow Up Recommendations: Patient plans to come in for labs after she is released by ortho.

## 2023-11-13 ENCOUNTER — Ambulatory Visit: Payer: Medicare (Managed Care) | Admitting: Orthopedic Surgery

## 2023-11-17 ENCOUNTER — Ambulatory Visit: Payer: Medicare (Managed Care) | Admitting: Orthopedic Surgery

## 2023-11-25 ENCOUNTER — Telehealth: Payer: Self-pay | Admitting: Neurology

## 2023-11-25 NOTE — Telephone Encounter (Signed)
 Copied from CRM [id number]. Topic: Clinical - Request for Lab/Test Order >> Nov 25, 2023 12:50 PM Henretta I wrote: Reason for CRM: Patient would like to know if lab orders can be ordered or if she has to be seen for appt. *Appt is already scheduled if she does need to be seen first.

## 2023-11-25 NOTE — Telephone Encounter (Signed)
 Pt is scheduled for 12/01/23

## 2023-11-25 NOTE — Telephone Encounter (Signed)
 Pt should schedule an appt with Lowne. She hasn't been sent since Feb 2024

## 2023-12-01 ENCOUNTER — Ambulatory Visit (INDEPENDENT_AMBULATORY_CARE_PROVIDER_SITE_OTHER): Payer: Medicare (Managed Care) | Admitting: Family Medicine

## 2023-12-01 ENCOUNTER — Other Ambulatory Visit (INDEPENDENT_AMBULATORY_CARE_PROVIDER_SITE_OTHER): Payer: Medicare (Managed Care)

## 2023-12-01 ENCOUNTER — Encounter: Payer: Self-pay | Admitting: Family Medicine

## 2023-12-01 VITALS — BP 144/80 | HR 80 | Temp 98.4°F | Resp 16 | Ht 71.0 in | Wt 181.0 lb

## 2023-12-01 DIAGNOSIS — Z89411 Acquired absence of right great toe: Secondary | ICD-10-CM | POA: Diagnosis not present

## 2023-12-01 DIAGNOSIS — E1110 Type 2 diabetes mellitus with ketoacidosis without coma: Secondary | ICD-10-CM

## 2023-12-01 DIAGNOSIS — F411 Generalized anxiety disorder: Secondary | ICD-10-CM

## 2023-12-01 DIAGNOSIS — Z89431 Acquired absence of right foot: Secondary | ICD-10-CM | POA: Diagnosis not present

## 2023-12-01 DIAGNOSIS — E1169 Type 2 diabetes mellitus with other specified complication: Secondary | ICD-10-CM | POA: Diagnosis not present

## 2023-12-01 DIAGNOSIS — E785 Hyperlipidemia, unspecified: Secondary | ICD-10-CM

## 2023-12-01 DIAGNOSIS — I10 Essential (primary) hypertension: Secondary | ICD-10-CM

## 2023-12-01 DIAGNOSIS — E559 Vitamin D deficiency, unspecified: Secondary | ICD-10-CM

## 2023-12-01 NOTE — Assessment & Plan Note (Signed)
,   minimize simple carbs. Increase exercise as tolerated. Continue current meds Lab Results  Component Value Date   HGBA1C 6.8 (H) 09/03/2022    To be checked today

## 2023-12-01 NOTE — Assessment & Plan Note (Signed)
 Encourage heart healthy diet such as MIND or DASH diet, increase exercise, avoid trans fats, simple carbohydrates and processed foods, consider a krill or fish or flaxseed oil cap daily.

## 2023-12-01 NOTE — Assessment & Plan Note (Signed)
 Per Dr harden

## 2023-12-01 NOTE — Assessment & Plan Note (Signed)
Stable.  On lexapro.
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
## 2023-12-01 NOTE — Assessment & Plan Note (Signed)
 Well controlled, no changes to meds. Encouraged heart healthy diet such as the DASH diet and exercise as tolerated.

## 2023-12-02 LAB — LIPID PANEL
Cholesterol: 162 mg/dL (ref 0–200)
HDL: 86.3 mg/dL (ref >39.00)
LDL Cholesterol: 64 mg/dL (ref 0–99)
NonHDL: 76.05
Total CHOL/HDL Ratio: 2
Triglycerides: 58 mg/dL (ref 0.0–149.0)
VLDL: 11.6 mg/dL (ref 0.0–40.0)

## 2023-12-02 LAB — COMPREHENSIVE METABOLIC PANEL WITH GFR
ALT: 17 U/L (ref 0–35)
AST: 20 U/L (ref 0–37)
Albumin: 4.2 g/dL (ref 3.5–5.2)
Alkaline Phosphatase: 55 U/L (ref 39–117)
BUN: 13 mg/dL (ref 6–23)
CO2: 29 meq/L (ref 19–32)
Calcium: 9.4 mg/dL (ref 8.4–10.5)
Chloride: 104 meq/L (ref 96–112)
Creatinine, Ser: 0.8 mg/dL (ref 0.40–1.20)
GFR: 71.94 mL/min (ref >60.00)
Glucose, Bld: 110 mg/dL — ABNORMAL HIGH (ref 70–99)
Potassium: 4 meq/L (ref 3.5–5.1)
Sodium: 144 meq/L (ref 135–145)
Total Bilirubin: 0.3 mg/dL (ref 0.2–1.2)
Total Protein: 7.4 g/dL (ref 6.0–8.3)

## 2023-12-02 LAB — MICROALBUMIN / CREATININE URINE RATIO
Creatinine,U: 69.6 mg/dL
Microalb Creat Ratio: 46.2 mg/g — ABNORMAL HIGH (ref 0.0–30.0)
Microalb, Ur: 3.2 mg/dL — ABNORMAL HIGH (ref 0.0–1.9)

## 2023-12-02 LAB — CBC WITH DIFFERENTIAL/PLATELET
Basophils Absolute: 0.1 K/uL (ref 0.0–0.1)
Basophils Relative: 0.9 % (ref 0.0–3.0)
Eosinophils Absolute: 0 K/uL (ref 0.0–0.7)
Eosinophils Relative: 0.6 % (ref 0.0–5.0)
HCT: 42.9 % (ref 36.0–46.0)
Hemoglobin: 13.8 g/dL (ref 12.0–15.0)
Lymphocytes Relative: 20.2 % (ref 12.0–46.0)
Lymphs Abs: 1.5 K/uL (ref 0.7–4.0)
MCHC: 32.2 g/dL (ref 30.0–36.0)
MCV: 83.1 fl (ref 78.0–100.0)
Monocytes Absolute: 0.6 K/uL (ref 0.1–1.0)
Monocytes Relative: 7.7 % (ref 3.0–12.0)
Neutro Abs: 5.3 K/uL (ref 1.4–7.7)
Neutrophils Relative %: 70.6 % (ref 43.0–77.0)
Platelets: 254 K/uL (ref 150.0–400.0)
RBC: 5.16 Mil/uL — ABNORMAL HIGH (ref 3.87–5.11)
RDW: 15.2 % (ref 11.5–15.5)
WBC: 7.5 K/uL (ref 4.0–10.5)

## 2023-12-02 LAB — HEMOGLOBIN A1C: Hgb A1c MFr Bld: 7.2 % — ABNORMAL HIGH (ref 4.6–6.5)

## 2023-12-03 ENCOUNTER — Ambulatory Visit: Payer: Medicare (Managed Care) | Admitting: Family

## 2023-12-05 ENCOUNTER — Other Ambulatory Visit: Payer: Self-pay | Admitting: Family Medicine

## 2023-12-05 DIAGNOSIS — I10 Essential (primary) hypertension: Secondary | ICD-10-CM

## 2023-12-08 ENCOUNTER — Ambulatory Visit: Payer: Self-pay | Admitting: Family Medicine

## 2023-12-08 ENCOUNTER — Other Ambulatory Visit: Payer: Self-pay | Admitting: Family Medicine

## 2023-12-08 DIAGNOSIS — E1169 Type 2 diabetes mellitus with other specified complication: Secondary | ICD-10-CM

## 2023-12-08 DIAGNOSIS — I10 Essential (primary) hypertension: Secondary | ICD-10-CM

## 2023-12-08 DIAGNOSIS — E1110 Type 2 diabetes mellitus with ketoacidosis without coma: Secondary | ICD-10-CM

## 2023-12-09 ENCOUNTER — Other Ambulatory Visit (INDEPENDENT_AMBULATORY_CARE_PROVIDER_SITE_OTHER): Payer: Medicare (Managed Care)

## 2023-12-09 ENCOUNTER — Ambulatory Visit: Payer: Medicare (Managed Care) | Admitting: Family

## 2023-12-09 ENCOUNTER — Other Ambulatory Visit: Payer: Self-pay | Admitting: Family Medicine

## 2023-12-09 DIAGNOSIS — E1110 Type 2 diabetes mellitus with ketoacidosis without coma: Secondary | ICD-10-CM

## 2023-12-09 DIAGNOSIS — S8265XA Nondisplaced fracture of lateral malleolus of left fibula, initial encounter for closed fracture: Secondary | ICD-10-CM | POA: Diagnosis not present

## 2023-12-09 DIAGNOSIS — Z780 Asymptomatic menopausal state: Secondary | ICD-10-CM

## 2023-12-09 DIAGNOSIS — I10 Essential (primary) hypertension: Secondary | ICD-10-CM

## 2023-12-09 NOTE — Telephone Encounter (Unsigned)
 Copied from CRM [id number]. Topic: Clinical - Medication Refill >> Dec 09, 2023  3:50 PM [place] J wrote: Medication:  estradiol  (ESTRACE ) 1 MG tablet dapagliflozin  propanediol (FARXIGA ) 10 MG TABS tablet KLOR-CON  M20 20 MEQ tablet  sitaGLIPtin -metformin  (JANUMET ) 50-1000 MG  Has the patient contacted their pharmacy? Yes (Agent: If no, request that the patient contact the pharmacy for the refill. If patient does not wish to contact the pharmacy document the reason why and proceed with request.) (Agent: If yes, when and what did the pharmacy advise?) Pharmacy unable to refill.  This is the patient's preferred pharmacy:  [facility] [address], [place] - [address] [place] [address] Phone: [phone] Fax: [phone]  Is this the correct pharmacy for this prescription? Yes If no, delete pharmacy and type the correct one.   Has the prescription been filled recently? No  Is the patient out of the medication? Yes  Has the patient been seen for an appointment in the last year OR does the patient have an upcoming appointment? Yes  Can we respond through MyChart? No  Agent: Please be advised that Rx refills may take up to 3 business days. We ask that you follow-up with your pharmacy.

## 2023-12-10 ENCOUNTER — Encounter: Payer: Self-pay | Admitting: Family

## 2023-12-10 DIAGNOSIS — E1142 Type 2 diabetes mellitus with diabetic polyneuropathy: Secondary | ICD-10-CM | POA: Diagnosis not present

## 2023-12-10 DIAGNOSIS — G894 Chronic pain syndrome: Secondary | ICD-10-CM | POA: Diagnosis not present

## 2023-12-10 DIAGNOSIS — G4700 Insomnia, unspecified: Secondary | ICD-10-CM | POA: Diagnosis not present

## 2023-12-10 DIAGNOSIS — Z79891 Long term (current) use of opiate analgesic: Secondary | ICD-10-CM | POA: Diagnosis not present

## 2023-12-10 MED ORDER — JANUMET 50-1000 MG PO TABS: 2.0000 | ORAL_TABLET | Freq: Every day | ORAL | 2 refills | Status: DC

## 2023-12-10 MED ORDER — DAPAGLIFLOZIN PROPANEDIOL 10 MG PO TABS: 10.0000 mg | ORAL_TABLET | Freq: Every day | ORAL | 1 refills | Status: AC

## 2023-12-10 MED ORDER — ESTRADIOL 1 MG PO TABS: 1.0000 mg | ORAL_TABLET | Freq: Every day | ORAL | 2 refills | Status: DC

## 2023-12-10 MED ORDER — KLOR-CON M20 20 MEQ PO TBCR: 20.0000 meq | EXTENDED_RELEASE_TABLET | Freq: Every day | ORAL | 2 refills | Status: DC

## 2023-12-10 NOTE — Progress Notes (Signed)
 Office Visit Note   Patient: Brittany [name]           Date of Birth: March 03, 1946           MRN: [id number] Visit Date: 12/09/2023              Requested by: Antonio Meth, [place], [place] [address] [place],  [place] [address] PCP: Antonio Meth, Jamee SAUNDERS, DO  Chief Complaint  Patient presents with   Left Ankle - Follow-up      HPI: The patient is a 78 year old woman who returns after fracturing her left ankle it has now been about 2 months she has continued in the cam boot she has no ankle pain or concerns about the ankle she does continue to have some swelling.  Her main concern today is the chronic ulcer to the plantar aspect of that same foot she has been doing dry dressing changes  Assessment & Plan: Visit Diagnoses:  1. Nondisplaced fracture of lateral malleolus of left fibula, initial encounter for closed fracture     Plan: May discontinue the cam boot and advance to regular shoewear.  Discussed offloading the ulcer to the area did provide to felt pressure relieving donut pads for her shoe wear she will continue daily Dial soap cleansing and wound care. May use antibacterial ointment  Follow-Up Instructions: No follow-ups on file.   Ortho Exam  Patient is alert, oriented, no adenopathy, well-dressed, normal affect, normal respiratory effort. On examination left lower extremity the fibula remains nontender to palpation she does have 1+ pitting edema without erythema or weeping no warmth.  Beneath the plantar aspect of the midfoot she does have a Wagner grade 1 ulcer this is 1 cm in diameter 5 mm deep does not probe to bone there is surrounding maceration.  Ulcer over the bony prominence of her midfoot collapse      Imaging: No results found. No images are attached to the encounter.  Labs: Lab Results  Component Value Date   HGBA1C 7.2 (H) 12/01/2023   HGBA1C 6.8 (H) 09/03/2022   HGBA1C 7.0 (H) 05/21/2022   REPTSTATUS 08/15/2017 FINAL 08/10/2017    GRAMSTAIN NO WBC SEEN NO ORGANISMS SEEN  08/10/2017   CULT  08/10/2017    No growth aerobically or anaerobically. Performed at [facility], [address], [place], [place] [address]    IDOLINA  03/11/2016    Three or more organisms present,each greater than 10,000 CFU/mL.These organisms,commonly found on external and internal genitalia,are considered to be colonizers.No further testing performed.      Lab Results  Component Value Date   ALBUMIN 4.2 12/01/2023   ALBUMIN 3.9 09/03/2022   ALBUMIN 3.9 05/21/2022    No results found for: MG No results found for: VD25OH  No results found for: PREALBUMIN    Latest Ref Rng & Units 12/01/2023    2:36 PM 05/21/2022    1:49 PM 12/06/2021    8:23 AM  CBC EXTENDED  WBC 4.0 - 10.5 K/uL 7.5  7.5  8.1   RBC 3.87 - 5.11 Mil/uL 5.16  4.96  4.96   Hemoglobin 12.0 - 15.0 g/dL 86.1  86.9  88.5   HCT 36.0 - 46.0 % 42.9  40.1  36.7   Platelets 150.0 - 400.0 K/uL 254.0  228.0  255.0   NEUT# 1.4 - 7.7 K/uL 5.3  4.5  5.8   Lymph# 0.7 - 4.0 K/uL 1.5  2.1  1.5  There is no height or weight on file to calculate BMI.  Orders:  Orders Placed This Encounter  Procedures   XR Ankle Complete Left   No orders of the defined types were placed in this encounter.    Procedures: No procedures performed  Clinical Data: No additional findings.  ROS:  All other systems negative, except as noted in the HPI. Review of Systems  Objective: Vital Signs: There were no vitals taken for this visit.  Specialty Comments:  No specialty comments available.  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/2021   Abdominal bloating 10/26/2019   Ulcer of toe of left foot, limited to breakdown of skin (HCC) 07/13/2019   Status post total replacement of left hip 11/16/2018   Vaginal prolapse 09/30/2018   Primary osteoarthritis of left hip 06/10/2018   Osteomyelitis of second toe of right foot (HCC) 04/24/2018   Osteomyelitis of right foot (HCC) 04/21/2018   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) 04/17/2018   Callus 01/13/2018   Generalized anxiety disorder 10/16/2017   Menopause 10/16/2017   Diabetes mellitus, type II (HCC) 10/16/2017   Hyperlipidemia associated with type 2 diabetes mellitus (HCC) 10/16/2017   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) 10/14/2017   Osteomyelitis of great toe of right foot (HCC) 09/25/2017   Pain in right foot 09/16/2017   Cellulitis 08/08/2017   Hypoglycemia without diagnosis of diabetes mellitus 08/08/2017   Peripheral neuropathy 08/08/2017   Abscess 08/08/2017   Hyperlipidemia LDL goal <70 09/22/2016   Tooth pain 09/03/2012   Edema 09/03/2012   Supraclavicular fossa fullness 06/18/2012   Hair loss 09/18/2010   Fatigue 09/18/2010   DYSPEPSIA&OTHER SPEC DISORDERS FUNCTION STOMACH 05/08/2010   DIARRHEA 05/08/2010   DIZZINESS 04/19/2010   OTHER ACUTE SINUSITIS 02/28/2010   NAUSEA 02/28/2010   Pain in right ankle and joints of right foot 02/15/2010   URI 07/09/2007   GERD 02/12/2007   LOW BACK PAIN 02/12/2007   Hypertension 09/03/2006   HOT FLASHES 09/03/2006   INSOMNIA 09/03/2006   Past Medical History:  Diagnosis Date   Anemia    as a younger woman   Anxiety    Arthritis    all over my body (10/01/2017)   Chronic pain    In pain clinic    Constipation    Depression    Diabetic peripheral neuropathy (HCC)    GERD (gastroesophageal reflux disease)    High cholesterol    History of hiatal hernia    Hypertension     Type II diabetes mellitus (HCC) dx'd ~ 2008    Family History  Problem Relation Age of Onset   Cancer Mother        breast   Depression Mother        bipolar   Breast cancer Mother 62   Heart disease Father 65       MI   Diabetes Brother    Kidney disease Brother    Hypertension Brother    Hyperlipidemia Brother    Stroke Brother    Coronary artery disease Other  Diabetes Other    Colon cancer Neg Hx    Colon polyps Neg Hx    Esophageal cancer Neg Hx    Rectal cancer Neg Hx    Stomach cancer Neg Hx     Past Surgical History:  Procedure Laterality Date   AMPUTATION Right 09/29/2017   Procedure: AMPUTATION RIGHT 1ST RAY;  Surgeon: Jerri Kay HERO, MD;  Location: [facility] OR;  Service: Orthopedics;  Laterality: Right;   AMPUTATION Right 01/29/2019   Procedure: RIGHT TRANSMETATARSAL AMPUTATION;  Surgeon: Harden Jerona GAILS, MD;  Location: [facility] OR;  Service: Orthopedics;  Laterality: Right;   AMPUTATION TOE Right 04/24/2018   Procedure: RIGHT 2ND TOE PROXIMAL INTERPHALANGEAL JOINT DISARTICULATION;  Surgeon: Jerri Kay HERO, MD;  Location: [facility];  Service: Orthopedics;  Laterality: Right;   APPLICATION OF WOUND VAC Right 09/29/2017   foot   BLADDER SUSPENSION  07/13/2020   Dr Alvia    COLONOSCOPY     I & D EXTREMITY Right 08/10/2017   Procedure: IRRIGATION AND DEBRIDEMENT FOOT;  Surgeon: Jerri Kay HERO, MD;  Location: [facility] OR;  Service: Orthopedics;  Laterality: Right;   LUMBAR DISC SURGERY  1992   POLYPECTOMY     POSTERIOR LUMBAR FUSION  1996   TOE SURGERY Right 1980s   bone removed inbetween my toes   TOTAL HIP ARTHROPLASTY Left 11/16/2018   Procedure: LEFT TOTAL HIP ARTHROPLASTY ANTERIOR APPROACH;  Surgeon: Jerri Kay HERO, MD;  Location: [facility] OR;  Service: Orthopedics;  Laterality: Left;   TOTAL HIP ARTHROPLASTY Right 07/23/2021   Procedure: RIGHT TOTAL HIP REPLACEMENT;  Surgeon: Jerri Kay HERO, MD;  Location: [facility] OR;  Service: Orthopedics;  Laterality: Right;    VAGINAL HYSTERECTOMY     Social History   Occupational History   Not on file  Tobacco Use   Smoking status: Never   Smokeless tobacco: Never  Vaping Use   Vaping status: Never Used  Substance and Sexual Activity   Alcohol  use: Never   Drug use: Never   Sexual activity: Not Currently    Partners: Male

## 2024-01-06 ENCOUNTER — Encounter: Payer: Self-pay | Admitting: Family

## 2024-01-06 ENCOUNTER — Ambulatory Visit: Payer: Medicare (Managed Care) | Admitting: Family

## 2024-01-06 DIAGNOSIS — L97521 Non-pressure chronic ulcer of other part of left foot limited to breakdown of skin: Secondary | ICD-10-CM

## 2024-01-06 NOTE — Progress Notes (Signed)
 Office Visit Note   Patient: Brittany [name]           Date of Birth: [birth year]           MRN: [id number] Visit Date: 01/06/2024              Requested by: Antonio Meth, [place], [place] [address] [place],  [place] [address] PCP: Antonio Meth, Jamee SAUNDERS, DO  Chief Complaint  Patient presents with   Left Ankle - Follow-up      HPI: The patient is a 78 year old woman who presents in follow-up for [facility] grade 1 ulcer left foot.  The ulcer is distressing to her as this has failed to improve and does cause some discomfort however she is have peripheral neuropathy  Today is in crocs  She is also about 3 months out from a left ankle fracture she has been in regular shoewear as for the ankle she complains of no pain she does continue to have some mild to moderate swelling this is her only concerned about the ankle  Assessment & Plan: Visit Diagnoses:  1. Non-pressure chronic ulcer of other part of left foot limited to breakdown of skin (HCC)     Plan: Again discussed offloading the ulcer to the area recommended she resume her felt pressure relieving donut pads and her shoewear she will continue with daily Dial soap cleansing antibacterial ointment dressing changes.  Will refer to vascular she has previously been scheduled for ABIs but did have to cancel these due to family issue.  Follow-Up Instructions: No follow-ups on file.   Ortho Exam  Patient is alert, oriented, no adenopathy, well-dressed, normal affect, normal respiratory effort. On examination left lower extremity there is mild to moderate edema about the ankle without erythema warmth.  Ankle overall nontender.  She has a Wagner grade 1 ulcer to the plantar aspect of the medial border of the midfoot this is 1 cm in diameter 5 mm deep there is surrounding maceration there is no active drainage no erythema no warmth no purulence  Unable to palpate pulses however she does have a biphasic pulse with a Doppler of the  dorsalis pedis and posterior tibial    Imaging: No results found. No images are attached to the encounter.  Labs: Lab Results  Component Value Date   HGBA1C 7.2 (H) 12/01/2023   HGBA1C 6.8 (H) 09/03/2022   HGBA1C 7.0 (H) 05/21/2022   REPTSTATUS 08/15/2017 FINAL 08/10/2017   GRAMSTAIN NO WBC SEEN NO ORGANISMS SEEN  08/10/2017   CULT  08/10/2017    No growth aerobically or anaerobically. Performed at [facility], [address] [place], [place] [address]    IDOLINA  03/11/2016    Three or more organisms present,each greater than 10,000 CFU/mL.These organisms,commonly found on external and internal genitalia,are considered to be colonizers.No further testing performed.      Lab Results  Component Value Date   ALBUMIN 4.2 12/01/2023   ALBUMIN 3.9 09/03/2022   ALBUMIN 3.9 05/21/2022    No results found for: MG No results found for: VD25OH  No results found for: PREALBUMIN    Latest Ref Rng & Units 12/01/2023    2:36 PM 05/21/2022    1:49 PM 12/06/2021    8:23 AM  CBC EXTENDED  WBC 4.0 - 10.5 K/uL 7.5  7.5  8.1   RBC 3.87 - 5.11 Mil/uL 5.16  4.96  4.96   Hemoglobin 12.0 - 15.0 g/dL 13.8  13.0  11.4   HCT 36.0 - 46.0 % 42.9  40.1  36.7   Platelets 150.0 - 400.0 K/uL 254.0  228.0  255.0   NEUT# 1.4 - 7.7 K/uL 5.3  4.5  5.8   Lymph# 0.7 - 4.0 K/uL 1.5  2.1  1.5      There is no height or weight on file to calculate BMI.  Orders:  Orders Placed This Encounter  Procedures   VAS US  ABI WITH/WO TBI   No orders of the defined types were placed in this encounter.    Procedures: No procedures performed  Clinical Data: No additional findings.  ROS:  All other systems negative, except as noted in the HPI. Review of Systems  Objective: Vital Signs: There were no vitals taken for this visit.  Specialty Comments:  No specialty comments available.  PMFS History: Patient Active Problem List   Diagnosis Date Noted   History of transmetatarsal  amputation of right foot (HCC) 12/01/2023   Uncontrolled type 2 diabetes mellitus with ketoacidosis without coma, without long-term current use of insulin  (HCC) 12/01/2023   Preventative health care 05/21/2022   Primary osteoarthritis of right hip 07/23/2021   Degenerative joint disease of right hip 07/23/2021   Status post total replacement of right hip 07/23/2021   Abdominal bloating 10/26/2019   Ulcer of toe of left foot, limited to breakdown of skin (HCC) 07/13/2019   Status post total replacement of left hip 11/16/2018   Vaginal prolapse 09/30/2018   Primary osteoarthritis of left hip 06/10/2018   Osteomyelitis of second toe of right foot (HCC) 04/24/2018   Osteomyelitis of right foot (HCC) 04/21/2018   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) 04/17/2018   Callus 01/13/2018   Generalized anxiety disorder 10/16/2017   Menopause 10/16/2017   Diabetes mellitus, type II (HCC) 10/16/2017   Hyperlipidemia associated with type 2 diabetes mellitus (HCC) 10/16/2017   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 10/14/2017   Osteomyelitis of great toe of right foot (HCC) 09/25/2017   Pain in right foot 09/16/2017   Cellulitis 08/08/2017   Hypoglycemia without diagnosis of diabetes mellitus 08/08/2017   Peripheral neuropathy 08/08/2017   Abscess 08/08/2017   Hyperlipidemia LDL goal <70 09/22/2016   Tooth pain 09/03/2012   Edema 09/03/2012   Supraclavicular fossa fullness 06/18/2012   Hair loss 09/18/2010   Fatigue 09/18/2010   DYSPEPSIA&OTHER SPEC DISORDERS FUNCTION STOMACH 05/08/2010   DIARRHEA 05/08/2010   DIZZINESS 04/19/2010   OTHER ACUTE SINUSITIS 02/28/2010   NAUSEA 02/28/2010   Pain in right ankle and joints of right foot 02/15/2010   URI 07/09/2007   GERD 02/12/2007   LOW BACK PAIN 02/12/2007   Hypertension 09/03/2006   HOT FLASHES 09/03/2006   INSOMNIA 09/03/2006   Past Medical History:  Diagnosis Date   Anemia    as a younger woman    Anxiety    Arthritis    all over my body (10/01/2017)   Chronic pain    In pain clinic    Constipation    Depression    Diabetic peripheral neuropathy (HCC)    GERD (gastroesophageal reflux disease)    High cholesterol    History of hiatal hernia    Hypertension    Type II diabetes mellitus (HCC) dx'd ~ 2008    Family History  Problem Relation Age of Onset   Cancer Mother        breast   Depression Mother  bipolar   Breast cancer Mother 19   Heart disease Father 80       MI   Diabetes Brother    Kidney disease Brother    Hypertension Brother    Hyperlipidemia Brother    Stroke Brother    Coronary artery disease Other    Diabetes Other    Colon cancer Neg Hx    Colon polyps Neg Hx    Esophageal cancer Neg Hx    Rectal cancer Neg Hx    Stomach cancer Neg Hx     Past Surgical History:  Procedure Laterality Date   AMPUTATION Right 09/29/2017   Procedure: AMPUTATION RIGHT 1ST RAY;  Surgeon: Jerri Kay HERO, MD;  Location: [facility] OR;  Service: Orthopedics;  Laterality: Right;   AMPUTATION Right 01/29/2019   Procedure: RIGHT TRANSMETATARSAL AMPUTATION;  Surgeon: Harden Jerona GAILS, MD;  Location[facility];  Service: Orthopedics;  Laterality: Right;   AMPUTATION TOE Right 04/24/2018   Procedure: RIGHT 2ND TOE PROXIMAL INTERPHALANGEAL JOINT DISARTICULATION;  Surgeon: Jerri Kay HERO, MD;  Location: [facility];  Service: Orthopedics;  Laterality: Right;   APPLICATION OF WOUND VAC Right 09/29/2017   foot   BLADDER SUSPENSION  07/13/2020   Dr Alvia    COLONOSCOPY     I & D EXTREMITY Right 08/10/2017   Procedure: IRRIGATION AND DEBRIDEMENT FOOT;  Surgeon: Jerri Kay HERO, MD;  Location: [facility];  Service: Orthopedics;  Laterality: Right;   LUMBAR DISC SURGERY  1992   POLYPECTOMY     POSTERIOR LUMBAR FUSION  1996   TOE SURGERY Right 1980s   bone removed inbetween my toes   TOTAL HIP ARTHROPLASTY Left 11/16/2018   Procedure: LEFT TOTAL HIP ARTHROPLASTY ANTERIOR  APPROACH;  Surgeon: Jerri Kay HERO, MD;  Location: [facility];  Service: Orthopedics;  Laterality: Left;   TOTAL HIP ARTHROPLASTY Right 07/23/2021   Procedure: RIGHT TOTAL HIP REPLACEMENT;  Surgeon: Jerri Kay HERO, MD;  Location: [facility];  Service: Orthopedics;  Laterality: Right;   VAGINAL HYSTERECTOMY     Social History   Occupational History   Not on file  Tobacco Use   Smoking status: Never   Smokeless tobacco: Never  Vaping Use   Vaping status: Never Used  Substance and Sexual Activity   Alcohol  use: Never   Drug use: Never   Sexual activity: Not Currently    Partners: Male

## 2024-02-06 ENCOUNTER — Ambulatory Visit: Payer: Medicare (Managed Care) | Admitting: Family

## 2024-02-06 DIAGNOSIS — L97521 Non-pressure chronic ulcer of other part of left foot limited to breakdown of skin: Secondary | ICD-10-CM | POA: Diagnosis not present

## 2024-02-06 DIAGNOSIS — I872 Venous insufficiency (chronic) (peripheral): Secondary | ICD-10-CM

## 2024-02-11 ENCOUNTER — Encounter (HOSPITAL_COMMUNITY): Payer: Self-pay

## 2024-02-12 ENCOUNTER — Telehealth: Payer: Self-pay | Admitting: Orthopedic Surgery

## 2024-02-12 NOTE — Telephone Encounter (Signed)
 Pt called stating that she needs to know where her prescription has been sent. She says she's called around and can't find it. Pt call back number is [phone].

## 2024-02-12 NOTE — Telephone Encounter (Signed)
 Pt had appt on 02/06/2024 states rx was called in? I do not see anything in chart please advise.

## 2024-02-13 MED ORDER — DOXYCYCLINE HYCLATE 100 MG PO TABS: 100.0000 mg | ORAL_TABLET | Freq: Two times a day (BID) | ORAL | 0 refills | Status: DC

## 2024-02-13 NOTE — Telephone Encounter (Signed)
 Sent to [facility], tried to call, no answer

## 2024-02-24 ENCOUNTER — Telehealth: Payer: Self-pay | Admitting: Family

## 2024-02-24 NOTE — Telephone Encounter (Signed)
 Patient called and said that Rocky said she will give her silver cel pad. She said she would pick it up when she is ready. CB#[phone]

## 2024-02-24 NOTE — Telephone Encounter (Signed)
 Pt informed. I will leave silvercel pad and a couple ace wraps for her at her request. She will come and pick up tomorrow.

## 2024-02-28 ENCOUNTER — Encounter: Payer: Self-pay | Admitting: Family

## 2024-02-28 NOTE — Progress Notes (Signed)
 Office Visit Note   Patient: Brittany [name]           Date of Birth: 04/08/1946           MRN: [id number] Visit Date: 02/06/2024              Requested by: Antonio Meth, [place], [place] [address] [place],  [place] [address] PCP: Antonio Meth, Jamee SAUNDERS, DO  Chief Complaint  Patient presents with   Left Ankle - Follow-up      HPI: The patient is a 78 year old woman who presents in follow-up for [facility] grade 1 ulcer left foot.  Today she is in slide on foam shoe wear   She is also about l4 months out from a left ankle fracture she has been in regular shoewear as for the ankle she complains of no pain she does continue to have some mild to moderate swelling.  Continues to seek reassurance that the swelling is typical  Assessment & Plan: Visit Diagnoses:  No diagnosis found.   Plan: Again discussed offloading the ulcer to the area recommended she resume her felt pressure relieving donut pads and her shoewear she will continue with daily Dial soap cleansing antibacterial ointment dressing changes.   Vascular has reached out to her several times for scheduling of the ABIs she has not yet set this up.  Follow-Up Instructions: No follow-ups on file.   Ortho Exam  Patient is alert, oriented, no adenopathy, well-dressed, normal affect, normal respiratory effort. On examination left lower extremity there is mild to moderate edema about the ankle without erythema.   She has a Wagner grade 1 ulcer to the plantar aspect of the medial border of the midfoot this is 1 cm in diameter 5 mm deep there is surrounding maceration and hyperkeratotic tissue this was debrided with 10 blade knife back to viable tissue.  There is no active drainage no erythema no warmth no purulence   Imaging: No results found. No images are attached to the encounter.  Labs: Lab Results  Component Value Date   HGBA1C 7.2 (H) 12/01/2023   HGBA1C 6.8 (H) 09/03/2022   HGBA1C 7.0 (H) 05/21/2022    REPTSTATUS 08/15/2017 FINAL 08/10/2017   GRAMSTAIN NO WBC SEEN NO ORGANISMS SEEN  08/10/2017   CULT  08/10/2017    No growth aerobically or anaerobically. Performed at [facility], [address], [place], [place] [address]    IDOLINA  03/11/2016    Three or more organisms present,each greater than 10,000 CFU/mL.These organisms,commonly found on external and internal genitalia,are considered to be colonizers.No further testing performed.      Lab Results  Component Value Date   ALBUMIN 4.2 12/01/2023   ALBUMIN 3.9 09/03/2022   ALBUMIN 3.9 05/21/2022    No results found for: MG No results found for: VD25OH  No results found for: PREALBUMIN    Latest Ref Rng & Units 12/01/2023    2:36 PM 05/21/2022    1:49 PM 12/06/2021    8:23 AM  CBC EXTENDED  WBC 4.0 - 10.5 K/uL 7.5  7.5  8.1   RBC 3.87 - 5.11 Mil/uL 5.16  4.96  4.96   Hemoglobin 12.0 - 15.0 g/dL 86.1  86.9  88.5   HCT 36.0 - 46.0 % 42.9  40.1  36.7   Platelets 150.0 - 400.0 K/uL 254.0  228.0  255.0   NEUT# 1.4 - 7.7 K/uL 5.3  4.5  5.8   Lymph#  0.7 - 4.0 K/uL 1.5  2.1  1.5      There is no height or weight on file to calculate BMI.  Orders:  No orders of the defined types were placed in this encounter.  No orders of the defined types were placed in this encounter.    Procedures: No procedures performed  Clinical Data: No additional findings.  ROS:  All other systems negative, except as noted in the HPI. Review of Systems  Objective: Vital Signs: There were no vitals taken for this visit.  Specialty Comments:  No specialty comments available.  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   Abdominal bloating 10/26/2019   Ulcer of toe of left foot, limited to breakdown of skin (HCC) 07/13/2019   Status post total replacement of left hip 11/16/2018   Vaginal prolapse 09/30/2018   Primary osteoarthritis of left hip 06/10/2018   Osteomyelitis of second toe of right foot (HCC) 04/24/2018   Osteomyelitis of right foot (HCC) 04/21/2018   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) 04/17/2018   Callus 01/13/2018   Generalized anxiety disorder 10/16/2017   Menopause 10/16/2017   Diabetes mellitus, type II (HCC) 10/16/2017   Hyperlipidemia associated with type 2 diabetes mellitus (HCC) 10/16/2017   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 10/14/2017   Osteomyelitis of great toe of right foot (HCC) 09/25/2017   Pain in right foot 09/16/2017   Cellulitis 08/08/2017   Hypoglycemia without diagnosis of diabetes mellitus 08/08/2017   Peripheral neuropathy 08/08/2017   Abscess 08/08/2017   Hyperlipidemia LDL goal <70 09/22/2016   Tooth pain 09/03/2012   Edema 09/03/2012   Supraclavicular fossa fullness 06/18/2012   Hair loss 09/18/2010   Fatigue 09/18/2010   DYSPEPSIA&OTHER SPEC DISORDERS FUNCTION STOMACH 05/08/2010   DIARRHEA 05/08/2010   DIZZINESS 04/19/2010   OTHER ACUTE SINUSITIS 02/28/2010   NAUSEA 02/28/2010   Pain in right ankle and joints of right foot 02/15/2010   URI 07/09/2007   GERD 02/12/2007   LOW BACK PAIN 02/12/2007   Hypertension 09/03/2006   HOT FLASHES 09/03/2006   INSOMNIA 09/03/2006   Past Medical History:  Diagnosis Date   Anemia    as a younger woman   Anxiety    Arthritis    all over my body (10/01/2017)   Chronic pain    In pain clinic    Constipation    Depression    Diabetic peripheral neuropathy (HCC)    GERD (gastroesophageal reflux disease)    High cholesterol    History of hiatal  hernia    Hypertension    Type II diabetes mellitus (HCC) dx'd ~ 2008    Family History  Problem Relation Age of Onset   Cancer Mother        breast   Depression Mother        bipolar   Breast cancer Mother 48   Heart disease Father 65       MI   Diabetes Brother    Kidney disease Brother    Hypertension Brother    Hyperlipidemia Brother  Stroke Brother    Coronary artery disease Other    Diabetes Other    Colon cancer Neg Hx    Colon polyps Neg Hx    Esophageal cancer Neg Hx    Rectal cancer Neg Hx    Stomach cancer Neg Hx     Past Surgical History:  Procedure Laterality Date   AMPUTATION Right 09/29/2017   Procedure: AMPUTATION RIGHT 1ST RAY;  Surgeon: Jerri Kay HERO, MD;  Location: [facility] OR;  Service: Orthopedics;  Laterality: Right;   AMPUTATION Right 01/29/2019   Procedure: RIGHT TRANSMETATARSAL AMPUTATION;  Surgeon: Harden Jerona GAILS, MD;  Location: [facility] OR;  Service: Orthopedics;  Laterality: Right;   AMPUTATION TOE Right 04/24/2018   Procedure: RIGHT 2ND TOE PROXIMAL INTERPHALANGEAL JOINT DISARTICULATION;  Surgeon: Jerri Kay HERO, MD;  Location: [facility];  Service: Orthopedics;  Laterality: Right;   APPLICATION OF WOUND VAC Right 09/29/2017   foot   BLADDER SUSPENSION  07/13/2020   Dr Alvia    COLONOSCOPY     I & D EXTREMITY Right 08/10/2017   Procedure: IRRIGATION AND DEBRIDEMENT FOOT;  Surgeon: Jerri Kay HERO, MD;  Location: [facility] OR;  Service: Orthopedics;  Laterality: Right;   LUMBAR DISC SURGERY  1992   POLYPECTOMY     POSTERIOR LUMBAR FUSION  1996   TOE SURGERY Right 1980s   bone removed inbetween my toes   TOTAL HIP ARTHROPLASTY Left 11/16/2018   Procedure: LEFT TOTAL HIP ARTHROPLASTY ANTERIOR APPROACH;  Surgeon: Jerri Kay HERO, MD;  Location: [facility] OR;  Service: Orthopedics;  Laterality: Left;   TOTAL HIP ARTHROPLASTY Right 07/23/2021   Procedure: RIGHT TOTAL HIP REPLACEMENT;  Surgeon: Jerri Kay HERO, MD;  Location: [facility] OR;  Service: Orthopedics;   Laterality: Right;   VAGINAL HYSTERECTOMY     Social History   Occupational History   Not on file  Tobacco Use   Smoking status: Never   Smokeless tobacco: Never  Vaping Use   Vaping status: Never Used  Substance and Sexual Activity   Alcohol  use: Never   Drug use: Never   Sexual activity: Not Currently    Partners: Male

## 2024-03-02 ENCOUNTER — Other Ambulatory Visit: Payer: Self-pay | Admitting: Family Medicine

## 2024-03-02 DIAGNOSIS — E1110 Type 2 diabetes mellitus with ketoacidosis without coma: Secondary | ICD-10-CM

## 2024-03-02 DIAGNOSIS — Z780 Asymptomatic menopausal state: Secondary | ICD-10-CM

## 2024-03-09 ENCOUNTER — Ambulatory Visit: Admitting: Family

## 2024-03-12 ENCOUNTER — Ambulatory Visit: Admitting: Family

## 2024-03-19 ENCOUNTER — Ambulatory Visit: Admitting: Family

## 2024-03-22 ENCOUNTER — Telehealth: Payer: Self-pay | Admitting: Family

## 2024-03-22 NOTE — Telephone Encounter (Signed)
 Patient called. She would like to come and pick up silver cream and bandages.

## 2024-03-25 NOTE — Telephone Encounter (Signed)
 SW pt, she has an appt tomorrow. She can get her supplies at that time.

## 2024-03-26 ENCOUNTER — Ambulatory Visit: Admitting: Family

## 2024-03-26 DIAGNOSIS — E1110 Type 2 diabetes mellitus with ketoacidosis without coma: Secondary | ICD-10-CM | POA: Diagnosis not present

## 2024-03-26 DIAGNOSIS — L03116 Cellulitis of left lower limb: Secondary | ICD-10-CM

## 2024-03-26 DIAGNOSIS — L97521 Non-pressure chronic ulcer of other part of left foot limited to breakdown of skin: Secondary | ICD-10-CM

## 2024-03-26 MED ORDER — SILVER SULFADIAZINE 1 % EX CREA: 1.0000 | TOPICAL_CREAM | Freq: Every day | CUTANEOUS | 0 refills | Status: AC

## 2024-03-26 MED ORDER — DOXYCYCLINE HYCLATE 100 MG PO TABS: 100.0000 mg | ORAL_TABLET | Freq: Two times a day (BID) | ORAL | 0 refills | Status: AC

## 2024-03-28 ENCOUNTER — Encounter: Payer: Self-pay | Admitting: Family

## 2024-03-28 NOTE — Progress Notes (Signed)
 "  Office Visit Note   Patient: Brittany [name]           Date of Birth: 1945/12/27           MRN: [id number] Visit Date: 03/26/2024              Requested by: Antonio Meth, [place], [place] [address] [place],  [place] [address] PCP: Antonio Meth, Jamee SAUNDERS, DO  Chief Complaint  Patient presents with   Left Ankle - Follow-up      HPI: The patient is a 78 year old woman seen in follow-up Wagner grade 1 ulcer to the left foot today she is in her regular shoewear she is not using any pressure relieving pad she is concerned about some increased drainage with associated odor some mild redness to the bottom of this foot  No fever no chills, no systemic symptoms  Assessment & Plan: Visit Diagnoses: No diagnosis found.  Plan: Will place on doxycycline  recommended cleansing the wound daily with Vashe she prefers to continue with Silvadene  dressing changes.  Recommended offloading the ulcer to the area.  Follow-Up Instructions: No follow-ups on file.   Ortho Exam  Patient is alert, oriented, no adenopathy, well-dressed, normal affect, normal respiratory effort. On examination left lower extremity there is mild erythema to the plantar aspect of the foot with some medial extension there is no ascending cellulitis.  The ulcer over the medial border of the midfoot remains 1 cm in diameter 5 mm deep with surrounding hyperkeratotic tissue and maceration up to a circumference of 3 cm. this was not debrided today.  There is no purulence.  This does not probe to bone or tendon   Imaging: No results found. No images are attached to the encounter.  Labs: Lab Results  Component Value Date   HGBA1C 7.2 (H) 12/01/2023   HGBA1C 6.8 (H) 09/03/2022   HGBA1C 7.0 (H) 05/21/2022   REPTSTATUS 08/15/2017 FINAL 08/10/2017   GRAMSTAIN NO WBC SEEN NO ORGANISMS SEEN  08/10/2017   CULT  08/10/2017    No growth aerobically or anaerobically. Performed at [facility], [address]  [place], [place] [address]    IDOLINA  03/11/2016    Three or more organisms present,each greater than 10,000 CFU/mL.These organisms,commonly found on external and internal genitalia,are considered to be colonizers.No further testing performed.      Lab Results  Component Value Date   ALBUMIN 4.2 12/01/2023   ALBUMIN 3.9 09/03/2022   ALBUMIN 3.9 05/21/2022    No results found for: MG No results found for: VD25OH  No results found for: PREALBUMIN    Latest Ref Rng & Units 12/01/2023    2:36 PM 05/21/2022    1:49 PM 12/06/2021    8:23 AM  CBC EXTENDED  WBC 4.0 - 10.5 K/uL 7.5  7.5  8.1   RBC 3.87 - 5.11 Mil/uL 5.16  4.96  4.96   Hemoglobin 12.0 - 15.0 g/dL 86.1  86.9  88.5   HCT 36.0 - 46.0 % 42.9  40.1  36.7   Platelets 150.0 - 400.0 K/uL 254.0  228.0  255.0   NEUT# 1.4 - 7.7 K/uL 5.3  4.5  5.8   Lymph# 0.7 - 4.0 K/uL 1.5  2.1  1.5      There is no height or weight on file to calculate BMI.  Orders:  No orders of the defined types were placed in this encounter.  Meds ordered this encounter  Medications   doxycycline  (VIBRA -TABS) 100 MG tablet    Sig: Take 1 tablet (100 mg total) by mouth 2 (two) times daily.    Dispense:  20 tablet    Refill:  0   silver  sulfADIAZINE  (SILVADENE ) 1 % cream    Sig: Apply 1 Application topically daily.    Dispense:  400 g    Refill:  0     Procedures: No procedures performed  Clinical Data: No additional findings.  ROS:  All other systems negative, except as noted in the HPI. Review of Systems  Objective: Vital Signs: There were no vitals taken for this visit.  Specialty Comments:  No specialty comments available.  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/23/2021   Abdominal bloating 10/26/2019   Ulcer of toe of left foot, limited to breakdown of skin (HCC) 07/13/2019   Status post total replacement of left hip 11/16/2018   Vaginal prolapse 09/30/2018   Primary osteoarthritis of left hip 06/10/2018   Osteomyelitis of second toe of right foot (HCC) 04/24/2018   Osteomyelitis of right foot (HCC) 04/21/2018   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) 04/17/2018   Callus 01/13/2018   Generalized anxiety disorder 10/16/2017   Menopause 10/16/2017   Diabetes mellitus, type II (HCC) 10/16/2017   Hyperlipidemia associated with type 2 diabetes mellitus (HCC) 10/16/2017   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 10/14/2017   Osteomyelitis of great toe of right foot (HCC) 09/25/2017   Pain in right foot 09/16/2017   Cellulitis 08/08/2017   Hypoglycemia without diagnosis of diabetes mellitus 08/08/2017   Peripheral neuropathy 08/08/2017   Abscess 08/08/2017   Hyperlipidemia LDL goal <70 09/22/2016   Tooth pain 09/03/2012   Edema 09/03/2012   Supraclavicular fossa fullness 06/18/2012   Hair loss 09/18/2010   Fatigue 09/18/2010   DYSPEPSIA&OTHER SPEC DISORDERS FUNCTION STOMACH 05/08/2010   DIARRHEA 05/08/2010   DIZZINESS 04/19/2010   OTHER ACUTE SINUSITIS 02/28/2010   NAUSEA 02/28/2010   Pain in right ankle and joints of right foot 02/15/2010   URI 07/09/2007   GERD 02/12/2007   LOW BACK PAIN 02/12/2007   Hypertension 09/03/2006   HOT FLASHES 09/03/2006   INSOMNIA 09/03/2006   Past Medical History:  Diagnosis Date   Anemia    as a younger woman   Anxiety    Arthritis    all over my body (10/01/2017)   Chronic pain    In pain clinic    Constipation    Depression    Diabetic peripheral neuropathy (HCC)    GERD (gastroesophageal reflux disease)     High cholesterol    History of hiatal hernia    Hypertension    Type II diabetes mellitus (HCC) dx'd ~ 2008    Family History  Problem Relation Age of Onset   Cancer Mother        breast   Depression Mother        bipolar   Breast cancer Mother 17   Heart disease Father 61       MI   Diabetes Brother  Kidney disease Brother    Hypertension Brother    Hyperlipidemia Brother    Stroke Brother    Coronary artery disease Other    Diabetes Other    Colon cancer Neg Hx    Colon polyps Neg Hx    Esophageal cancer Neg Hx    Rectal cancer Neg Hx    Stomach cancer Neg Hx     Past Surgical History:  Procedure Laterality Date   AMPUTATION Right 09/29/2017   Procedure: AMPUTATION RIGHT 1ST RAY;  Surgeon: Jerri Kay HERO, MD;  Location: [facility];  Service: Orthopedics;  Laterality: Right;   AMPUTATION Right 01/29/2019   Procedure: RIGHT TRANSMETATARSAL AMPUTATION;  Surgeon: Harden Jerona GAILS, MD;  Location: [facility];  Service: Orthopedics;  Laterality: Right;   AMPUTATION TOE Right 04/24/2018   Procedure: RIGHT 2ND TOE PROXIMAL INTERPHALANGEAL JOINT DISARTICULATION;  Surgeon: Jerri Kay HERO, MD;  Location: [facility];  Service: Orthopedics;  Laterality: Right;   APPLICATION OF WOUND VAC Right 09/29/2017   foot   BLADDER SUSPENSION  07/13/2020   Dr Alvia    COLONOSCOPY     I & D EXTREMITY Right 08/10/2017   Procedure: IRRIGATION AND DEBRIDEMENT FOOT;  Surgeon: Jerri Kay HERO, MD;  Location: [facility];  Service: Orthopedics;  Laterality: Right;   LUMBAR DISC SURGERY  1992   POLYPECTOMY     POSTERIOR LUMBAR FUSION  1996   TOE SURGERY Right 1980s   bone removed inbetween my toes   TOTAL HIP ARTHROPLASTY Left 11/16/2018   Procedure: LEFT TOTAL HIP ARTHROPLASTY ANTERIOR APPROACH;  Surgeon: Jerri Kay HERO, MD;  Location: [facility];  Service: Orthopedics;  Laterality: Left;   TOTAL HIP ARTHROPLASTY Right 07/23/2021   Procedure: RIGHT TOTAL HIP REPLACEMENT;  Surgeon: Jerri Kay HERO, MD;   Location: [facility];  Service: Orthopedics;  Laterality: Right;   VAGINAL HYSTERECTOMY     Social History   Occupational History   Not on file  Tobacco Use   Smoking status: Never   Smokeless tobacco: Never  Vaping Use   Vaping status: Never Used  Substance and Sexual Activity   Alcohol  use: Never   Drug use: Never   Sexual activity: Not Currently    Partners: Male       "

## 2024-03-29 ENCOUNTER — Telehealth: Payer: Self-pay | Admitting: Family

## 2024-03-29 NOTE — Telephone Encounter (Signed)
 Pt called asking for Brittany [name] to send antibiotic she was to send last week. Please send doxycyclione to [facility] [address] the patient number is [phone].

## 2024-03-29 NOTE — Telephone Encounter (Signed)
 Erin sent this on Friday 03/26/24, I will call pharmacy to check status.

## 2024-03-29 NOTE — Telephone Encounter (Signed)
 SW Rye at [facility]. He says that the Rx has been ready for pick up since Friday 03/26/24 when [facility] sent it. I called patient to let her know but her VM is full.

## 2024-03-29 NOTE — Telephone Encounter (Signed)
 Called again, no answer

## 2024-04-02 ENCOUNTER — Other Ambulatory Visit: Payer: Self-pay | Admitting: Family Medicine

## 2024-04-02 DIAGNOSIS — I10 Essential (primary) hypertension: Secondary | ICD-10-CM

## 2024-04-14 ENCOUNTER — Ambulatory Visit: Payer: Self-pay

## 2024-04-14 NOTE — Telephone Encounter (Signed)
 FYI Only or Action Required?: Action required by provider: clinical question for provider.  Patient was last seen in primary care on 12/01/2023 by Antonio Meth, Jamee SAUNDERS, DO.  Called Nurse Triage reporting Pressure Ulcer.  Symptoms began several weeks ago.  Interventions attempted: Nothing.  Symptoms are: gradually worsening.  Triage Disposition: Home Care  Patient/caregiver understands and will follow disposition?: Yes  Reason for Disposition  1 or 2 small sores  Answer Assessment - Initial Assessment Questions Patient states that she developed a small bed sore about 2 weeks ago that was healing. She then fell recently and thinks she scraped the area and it's reopened. She is requesting PCP recommendations on how to treat this sore and avoid infection.   1. APPEARANCE of SORES: What do the sores look like?     Pink  2. NUMBER: How many sores are there?     1  3. SIZE: How big is the largest sore?     Dime sized  4. LOCATION: Where are the sores located?     left buttocks  5. ONSET: When did the sores begin?     About 2 weeks ago  6. TENDER: Does it hurt when you touch it?  (Scale 1-10; or mild, moderate, severe)      Mild  7. CAUSE: What do you think is causing the sores?     Pressure sore  8. OTHER SYMPTOMS: Do you have any other symptoms? (e.g., fever, new weakness)     Denies any other symptoms  Protocols used: [facility]  Copied from CRM #[id number]. Topic: Clinical - Red Word Triage >> Apr 14, 2024  2:49 PM Roselie BROCKS wrote: Red Word that prompted transfer to Nurse Triage: Patient states she has a bed sore on her buttocks that is very painful and draining.

## 2024-04-15 NOTE — Telephone Encounter (Signed)
 Unable to schedule pt. Pt did not answer call, Voicemail box was full, and pt does not have a mychart

## 2024-04-19 ENCOUNTER — Ambulatory Visit: Admitting: Family Medicine

## 2024-04-27 ENCOUNTER — Ambulatory Visit: Admitting: Family

## 2024-05-01 ENCOUNTER — Other Ambulatory Visit: Payer: Self-pay | Admitting: Family Medicine

## 2024-05-01 DIAGNOSIS — I10 Essential (primary) hypertension: Secondary | ICD-10-CM

## 2024-05-01 DIAGNOSIS — E785 Hyperlipidemia, unspecified: Secondary | ICD-10-CM

## 2024-05-04 ENCOUNTER — Ambulatory Visit: Admitting: Family

## 2024-05-06 ENCOUNTER — Ambulatory Visit: Admitting: Physician Assistant

## 2024-05-12 ENCOUNTER — Ambulatory Visit: Admitting: Physician Assistant

## 2024-05-17 ENCOUNTER — Ambulatory Visit: Admitting: Physician Assistant

## 2024-06-01 ENCOUNTER — Ambulatory Visit: Payer: Medicare (Managed Care) | Admitting: Family Medicine

## 2024-11-16 ENCOUNTER — Ambulatory Visit
